# Patient Record
Sex: Female | Born: 1940 | Race: White | Hispanic: No | Marital: Married | Smoking: Never smoker
Health system: Southern US, Community
[De-identification: ages and names within clinical notes are randomized; demographics above are authoritative.]

## PROBLEM LIST (undated history)

## (undated) DIAGNOSIS — C50919 Malignant neoplasm of unspecified site of unspecified female breast: Secondary | ICD-10-CM

## (undated) DIAGNOSIS — F419 Anxiety disorder, unspecified: Secondary | ICD-10-CM

## (undated) DIAGNOSIS — D649 Anemia, unspecified: Secondary | ICD-10-CM

## (undated) DIAGNOSIS — F329 Major depressive disorder, single episode, unspecified: Secondary | ICD-10-CM

## (undated) DIAGNOSIS — C4492 Squamous cell carcinoma of skin, unspecified: Secondary | ICD-10-CM

## (undated) DIAGNOSIS — F32A Depression, unspecified: Secondary | ICD-10-CM

## (undated) DIAGNOSIS — K219 Gastro-esophageal reflux disease without esophagitis: Secondary | ICD-10-CM

## (undated) DIAGNOSIS — M810 Age-related osteoporosis without current pathological fracture: Secondary | ICD-10-CM

## (undated) DIAGNOSIS — I10 Essential (primary) hypertension: Secondary | ICD-10-CM

## (undated) DIAGNOSIS — E119 Type 2 diabetes mellitus without complications: Secondary | ICD-10-CM

## (undated) DIAGNOSIS — E785 Hyperlipidemia, unspecified: Secondary | ICD-10-CM

## (undated) DIAGNOSIS — I2699 Other pulmonary embolism without acute cor pulmonale: Secondary | ICD-10-CM

## (undated) DIAGNOSIS — M797 Fibromyalgia: Secondary | ICD-10-CM

## (undated) DIAGNOSIS — E039 Hypothyroidism, unspecified: Secondary | ICD-10-CM

## (undated) DIAGNOSIS — M1990 Unspecified osteoarthritis, unspecified site: Secondary | ICD-10-CM

## (undated) HISTORY — PX: MASTECTOMY: SHX3

## (undated) HISTORY — PX: CATARACT EXTRACTION: SUR2

## (undated) HISTORY — DX: Other pulmonary embolism without acute cor pulmonale: I26.99

## (undated) HISTORY — PX: APPENDECTOMY: SHX54

## (undated) HISTORY — DX: Anxiety disorder, unspecified: F41.9

## (undated) HISTORY — DX: Unspecified osteoarthritis, unspecified site: M19.90

## (undated) HISTORY — DX: Essential (primary) hypertension: I10

## (undated) HISTORY — DX: Hyperlipidemia, unspecified: E78.5

## (undated) HISTORY — PX: TOTAL ABDOMINAL HYSTERECTOMY W/ BILATERAL SALPINGOOPHORECTOMY: SHX83

## (undated) HISTORY — PX: TUBAL LIGATION: SHX77

## (undated) HISTORY — DX: Type 2 diabetes mellitus without complications: E11.9

## (undated) HISTORY — PX: BREAST RECONSTRUCTION: SHX9

## (undated) HISTORY — PX: SPINAL FUSION: SHX223

## (undated) HISTORY — DX: Depression, unspecified: F32.A

## (undated) HISTORY — DX: Hypothyroidism, unspecified: E03.9

## (undated) HISTORY — DX: Anemia, unspecified: D64.9

## (undated) HISTORY — DX: Age-related osteoporosis without current pathological fracture: M81.0

## (undated) HISTORY — DX: Malignant neoplasm of unspecified site of unspecified female breast: C50.919

## (undated) HISTORY — PX: ROTATOR CUFF REPAIR: SHX139

## (undated) HISTORY — DX: Fibromyalgia: M79.7

## (undated) HISTORY — DX: Squamous cell carcinoma of skin, unspecified: C44.92

## (undated) HISTORY — DX: Gastro-esophageal reflux disease without esophagitis: K21.9

## (undated) HISTORY — DX: Major depressive disorder, single episode, unspecified: F32.9

---

## 1991-03-21 DIAGNOSIS — C50919 Malignant neoplasm of unspecified site of unspecified female breast: Secondary | ICD-10-CM

## 1991-03-21 HISTORY — DX: Malignant neoplasm of unspecified site of unspecified female breast: C50.919

## 1991-03-21 HISTORY — PX: BREAST BIOPSY: SHX20

## 1997-12-22 ENCOUNTER — Encounter: Payer: Self-pay | Admitting: Family Medicine

## 1997-12-22 ENCOUNTER — Ambulatory Visit (HOSPITAL_COMMUNITY): Admission: RE | Admit: 1997-12-22 | Discharge: 1997-12-22 | Payer: Self-pay | Admitting: Family Medicine

## 1998-04-30 ENCOUNTER — Encounter: Payer: Self-pay | Admitting: Family Medicine

## 1998-04-30 ENCOUNTER — Ambulatory Visit (HOSPITAL_COMMUNITY): Admission: RE | Admit: 1998-04-30 | Discharge: 1998-04-30 | Payer: Self-pay | Admitting: Family Medicine

## 1999-01-03 ENCOUNTER — Encounter: Payer: Self-pay | Admitting: Oncology

## 1999-01-03 ENCOUNTER — Ambulatory Visit (HOSPITAL_COMMUNITY): Admission: RE | Admit: 1999-01-03 | Discharge: 1999-01-03 | Payer: Self-pay | Admitting: Oncology

## 1999-01-17 ENCOUNTER — Encounter: Admission: RE | Admit: 1999-01-17 | Discharge: 1999-02-08 | Payer: Self-pay | Admitting: Podiatry

## 1999-05-13 ENCOUNTER — Encounter: Payer: Self-pay | Admitting: Family Medicine

## 1999-05-13 ENCOUNTER — Encounter: Admission: RE | Admit: 1999-05-13 | Discharge: 1999-05-13 | Payer: Self-pay | Admitting: Family Medicine

## 2000-02-03 ENCOUNTER — Ambulatory Visit (HOSPITAL_COMMUNITY): Admission: RE | Admit: 2000-02-03 | Discharge: 2000-02-03 | Payer: Self-pay | Admitting: Obstetrics & Gynecology

## 2000-02-03 ENCOUNTER — Encounter: Payer: Self-pay | Admitting: Gynecology

## 2000-06-28 ENCOUNTER — Other Ambulatory Visit: Admission: RE | Admit: 2000-06-28 | Discharge: 2000-06-28 | Payer: Self-pay | Admitting: Gynecology

## 2000-08-07 ENCOUNTER — Encounter: Admission: RE | Admit: 2000-08-07 | Discharge: 2000-08-07 | Payer: Self-pay

## 2001-01-16 ENCOUNTER — Encounter: Payer: Self-pay | Admitting: Gastroenterology

## 2001-01-16 ENCOUNTER — Ambulatory Visit (HOSPITAL_COMMUNITY): Admission: RE | Admit: 2001-01-16 | Discharge: 2001-01-16 | Payer: Self-pay | Admitting: Gastroenterology

## 2001-07-09 ENCOUNTER — Ambulatory Visit (HOSPITAL_COMMUNITY): Admission: RE | Admit: 2001-07-09 | Discharge: 2001-07-09 | Payer: Self-pay | Admitting: Gynecology

## 2001-07-09 ENCOUNTER — Encounter: Payer: Self-pay | Admitting: Gynecology

## 2002-11-12 ENCOUNTER — Ambulatory Visit (HOSPITAL_COMMUNITY): Admission: RE | Admit: 2002-11-12 | Discharge: 2002-11-12 | Payer: Self-pay | Admitting: Gynecology

## 2002-11-12 ENCOUNTER — Encounter: Payer: Self-pay | Admitting: Gynecology

## 2003-11-18 ENCOUNTER — Ambulatory Visit (HOSPITAL_COMMUNITY): Admission: RE | Admit: 2003-11-18 | Discharge: 2003-11-18 | Payer: Self-pay | Admitting: Gynecology

## 2003-12-03 ENCOUNTER — Other Ambulatory Visit: Admission: RE | Admit: 2003-12-03 | Discharge: 2003-12-03 | Payer: Self-pay | Admitting: Gynecology

## 2004-06-13 ENCOUNTER — Encounter: Admission: RE | Admit: 2004-06-13 | Discharge: 2004-06-13 | Payer: Self-pay

## 2004-11-30 ENCOUNTER — Ambulatory Visit (HOSPITAL_COMMUNITY): Admission: RE | Admit: 2004-11-30 | Discharge: 2004-11-30 | Payer: Self-pay | Admitting: Gynecology

## 2004-12-13 ENCOUNTER — Encounter: Admission: RE | Admit: 2004-12-13 | Discharge: 2004-12-13 | Payer: Self-pay | Admitting: Family Medicine

## 2005-12-07 ENCOUNTER — Ambulatory Visit (HOSPITAL_COMMUNITY): Admission: RE | Admit: 2005-12-07 | Discharge: 2005-12-07 | Payer: Self-pay | Admitting: Family Medicine

## 2006-01-16 ENCOUNTER — Emergency Department (HOSPITAL_COMMUNITY): Admission: EM | Admit: 2006-01-16 | Discharge: 2006-01-16 | Payer: Self-pay | Admitting: Family Medicine

## 2006-07-25 ENCOUNTER — Encounter: Payer: Self-pay | Admitting: Internal Medicine

## 2006-07-27 ENCOUNTER — Encounter: Payer: Self-pay | Admitting: Internal Medicine

## 2006-11-28 ENCOUNTER — Emergency Department (HOSPITAL_COMMUNITY): Admission: EM | Admit: 2006-11-28 | Discharge: 2006-11-28 | Payer: Self-pay | Admitting: Emergency Medicine

## 2007-01-30 ENCOUNTER — Encounter: Admission: RE | Admit: 2007-01-30 | Discharge: 2007-01-30 | Payer: Self-pay | Admitting: Gynecology

## 2007-02-25 ENCOUNTER — Ambulatory Visit: Payer: Self-pay | Admitting: Internal Medicine

## 2007-03-08 ENCOUNTER — Encounter: Payer: Self-pay | Admitting: Internal Medicine

## 2007-03-08 ENCOUNTER — Ambulatory Visit: Payer: Self-pay | Admitting: Internal Medicine

## 2007-03-08 DIAGNOSIS — K222 Esophageal obstruction: Secondary | ICD-10-CM | POA: Insufficient documentation

## 2007-03-08 DIAGNOSIS — K2970 Gastritis, unspecified, without bleeding: Secondary | ICD-10-CM | POA: Insufficient documentation

## 2007-03-08 DIAGNOSIS — K2990 Gastroduodenitis, unspecified, without bleeding: Secondary | ICD-10-CM

## 2007-03-08 HISTORY — DX: Esophageal obstruction: K22.2

## 2007-03-08 LAB — HM COLONOSCOPY: HM Colonoscopy: NORMAL

## 2007-03-20 DIAGNOSIS — F411 Generalized anxiety disorder: Secondary | ICD-10-CM | POA: Insufficient documentation

## 2007-03-20 DIAGNOSIS — K449 Diaphragmatic hernia without obstruction or gangrene: Secondary | ICD-10-CM | POA: Insufficient documentation

## 2007-03-20 DIAGNOSIS — K219 Gastro-esophageal reflux disease without esophagitis: Secondary | ICD-10-CM | POA: Insufficient documentation

## 2007-03-20 DIAGNOSIS — C50919 Malignant neoplasm of unspecified site of unspecified female breast: Secondary | ICD-10-CM | POA: Insufficient documentation

## 2007-03-20 DIAGNOSIS — D68318 Other hemorrhagic disorder due to intrinsic circulating anticoagulants, antibodies, or inhibitors: Secondary | ICD-10-CM | POA: Insufficient documentation

## 2007-03-20 DIAGNOSIS — F3289 Other specified depressive episodes: Secondary | ICD-10-CM | POA: Insufficient documentation

## 2007-03-20 DIAGNOSIS — I10 Essential (primary) hypertension: Secondary | ICD-10-CM

## 2007-03-20 DIAGNOSIS — E039 Hypothyroidism, unspecified: Secondary | ICD-10-CM | POA: Insufficient documentation

## 2007-03-20 DIAGNOSIS — I2699 Other pulmonary embolism without acute cor pulmonale: Secondary | ICD-10-CM | POA: Insufficient documentation

## 2007-03-20 DIAGNOSIS — F329 Major depressive disorder, single episode, unspecified: Secondary | ICD-10-CM

## 2007-03-20 DIAGNOSIS — E785 Hyperlipidemia, unspecified: Secondary | ICD-10-CM | POA: Insufficient documentation

## 2007-03-20 HISTORY — DX: Essential (primary) hypertension: I10

## 2007-03-23 ENCOUNTER — Inpatient Hospital Stay (HOSPITAL_COMMUNITY): Admission: EM | Admit: 2007-03-23 | Discharge: 2007-03-28 | Payer: Self-pay | Admitting: Emergency Medicine

## 2007-03-25 ENCOUNTER — Encounter (INDEPENDENT_AMBULATORY_CARE_PROVIDER_SITE_OTHER): Payer: Self-pay | Admitting: Internal Medicine

## 2007-03-25 ENCOUNTER — Ambulatory Visit: Payer: Self-pay | Admitting: Vascular Surgery

## 2007-04-13 ENCOUNTER — Emergency Department (HOSPITAL_COMMUNITY): Admission: EM | Admit: 2007-04-13 | Discharge: 2007-04-14 | Payer: Self-pay | Admitting: Emergency Medicine

## 2007-05-16 ENCOUNTER — Ambulatory Visit: Payer: Self-pay | Admitting: Cardiology

## 2007-05-17 ENCOUNTER — Emergency Department (HOSPITAL_COMMUNITY): Admission: EM | Admit: 2007-05-17 | Discharge: 2007-05-17 | Payer: Self-pay | Admitting: Emergency Medicine

## 2007-05-23 ENCOUNTER — Ambulatory Visit: Payer: Self-pay

## 2007-06-26 ENCOUNTER — Ambulatory Visit (HOSPITAL_COMMUNITY): Admission: RE | Admit: 2007-06-26 | Discharge: 2007-06-26 | Payer: Self-pay | Admitting: Family Medicine

## 2007-08-09 ENCOUNTER — Ambulatory Visit: Payer: Self-pay | Admitting: Cardiology

## 2007-09-25 ENCOUNTER — Ambulatory Visit (HOSPITAL_COMMUNITY): Admission: RE | Admit: 2007-09-25 | Discharge: 2007-09-25 | Payer: Self-pay | Admitting: Orthopedic Surgery

## 2008-02-11 ENCOUNTER — Encounter: Admission: RE | Admit: 2008-02-11 | Discharge: 2008-02-11 | Payer: Self-pay | Admitting: Gynecology

## 2008-07-16 ENCOUNTER — Encounter: Payer: Self-pay | Admitting: Cardiology

## 2008-08-27 ENCOUNTER — Ambulatory Visit: Payer: Self-pay | Admitting: Internal Medicine

## 2008-08-27 DIAGNOSIS — R911 Solitary pulmonary nodule: Secondary | ICD-10-CM | POA: Insufficient documentation

## 2008-08-27 DIAGNOSIS — J3089 Other allergic rhinitis: Secondary | ICD-10-CM

## 2008-08-27 DIAGNOSIS — J984 Other disorders of lung: Secondary | ICD-10-CM | POA: Insufficient documentation

## 2008-08-27 DIAGNOSIS — J302 Other seasonal allergic rhinitis: Secondary | ICD-10-CM | POA: Insufficient documentation

## 2008-08-27 DIAGNOSIS — M797 Fibromyalgia: Secondary | ICD-10-CM | POA: Insufficient documentation

## 2008-08-27 DIAGNOSIS — D649 Anemia, unspecified: Secondary | ICD-10-CM | POA: Insufficient documentation

## 2008-08-27 DIAGNOSIS — E119 Type 2 diabetes mellitus without complications: Secondary | ICD-10-CM

## 2008-08-27 HISTORY — DX: Type 2 diabetes mellitus without complications: E11.9

## 2008-08-27 LAB — CONVERTED CEMR LAB
Albumin: 4.2 g/dL (ref 3.5–5.2)
Alkaline Phosphatase: 36 units/L — ABNORMAL LOW (ref 39–117)
Bilirubin, Direct: 0.1 mg/dL (ref 0.0–0.3)
Chloride: 111 meq/L (ref 96–112)
Cholesterol: 173 mg/dL (ref 0–200)
Creatinine, Ser: 1 mg/dL (ref 0.4–1.2)
Direct LDL: 102.3 mg/dL
Eosinophils Absolute: 0.1 10*3/uL (ref 0.0–0.7)
Eosinophils Relative: 1.3 % (ref 0.0–5.0)
Folate: >20 ng/mL
GFR calc non Af Amer: 58.89 mL/min (ref >60)
Lymphs Abs: 1.8 10*3/uL (ref 0.7–4.0)
MCV: 92.6 fL (ref 78.0–100.0)
Monocytes Relative: 8.5 % (ref 3.0–12.0)
Neutrophils Relative %: 51 % (ref 43.0–77.0)
Platelets: 177 10*3/uL (ref 150.0–400.0)
RBC: 3.9 M/uL (ref 3.87–5.11)
RDW: 11.9 % (ref 11.5–14.6)
Sodium: 146 meq/L — ABNORMAL HIGH (ref 135–145)
TSH: 0.73 microintl units/mL (ref 0.35–5.50)
Total Bilirubin: 0.8 mg/dL (ref 0.3–1.2)
Triglycerides: 306 mg/dL — ABNORMAL HIGH (ref 0.0–149.0)
Vitamin B-12: >1500 pg/mL — ABNORMAL HIGH (ref 211–911)
WBC: 4.7 10*3/uL (ref 4.5–10.5)

## 2008-09-01 ENCOUNTER — Ambulatory Visit: Payer: Self-pay | Admitting: Internal Medicine

## 2008-10-02 ENCOUNTER — Ambulatory Visit: Payer: Self-pay | Admitting: Internal Medicine

## 2008-10-07 ENCOUNTER — Ambulatory Visit: Payer: Self-pay | Admitting: Cardiology

## 2008-10-07 LAB — CONVERTED CEMR LAB: Prothrombin Time: 19.2 s

## 2008-11-05 ENCOUNTER — Ambulatory Visit: Payer: Self-pay | Admitting: Cardiology

## 2008-11-09 ENCOUNTER — Encounter: Payer: Self-pay | Admitting: Internal Medicine

## 2008-11-09 ENCOUNTER — Ambulatory Visit: Payer: Self-pay | Admitting: Internal Medicine

## 2008-11-09 DIAGNOSIS — R0609 Other forms of dyspnea: Secondary | ICD-10-CM

## 2008-11-09 HISTORY — DX: Other forms of dyspnea: R06.09

## 2008-11-10 ENCOUNTER — Telehealth: Payer: Self-pay | Admitting: Internal Medicine

## 2008-12-02 ENCOUNTER — Emergency Department (HOSPITAL_COMMUNITY): Admission: EM | Admit: 2008-12-02 | Discharge: 2008-12-02 | Payer: Self-pay | Admitting: Family Medicine

## 2008-12-04 ENCOUNTER — Ambulatory Visit: Payer: Self-pay | Admitting: Internal Medicine

## 2008-12-10 ENCOUNTER — Ambulatory Visit: Payer: Self-pay | Admitting: Internal Medicine

## 2008-12-17 ENCOUNTER — Telehealth (INDEPENDENT_AMBULATORY_CARE_PROVIDER_SITE_OTHER): Payer: Self-pay

## 2008-12-24 ENCOUNTER — Telehealth: Payer: Self-pay | Admitting: Internal Medicine

## 2008-12-31 ENCOUNTER — Encounter: Admission: RE | Admit: 2008-12-31 | Discharge: 2008-12-31 | Payer: Self-pay | Admitting: Endocrinology

## 2009-01-13 ENCOUNTER — Ambulatory Visit: Payer: Self-pay | Admitting: Cardiology

## 2009-01-14 ENCOUNTER — Ambulatory Visit: Payer: Self-pay | Admitting: Internal Medicine

## 2009-01-14 DIAGNOSIS — B9789 Other viral agents as the cause of diseases classified elsewhere: Secondary | ICD-10-CM | POA: Insufficient documentation

## 2009-01-14 LAB — HM DIABETES FOOT EXAM

## 2009-02-02 ENCOUNTER — Ambulatory Visit: Payer: Self-pay | Admitting: Cardiology

## 2009-02-24 ENCOUNTER — Ambulatory Visit: Payer: Self-pay | Admitting: Cardiology

## 2009-03-24 ENCOUNTER — Ambulatory Visit: Payer: Self-pay | Admitting: Cardiovascular Disease

## 2009-03-24 LAB — CONVERTED CEMR LAB: POC INR: 2.1

## 2009-04-19 ENCOUNTER — Encounter: Payer: Self-pay | Admitting: Internal Medicine

## 2009-04-19 LAB — CONVERTED CEMR LAB
AST: 25 units/L
Alkaline Phosphatase: 37 units/L
BUN: 23 mg/dL
CO2: 32 meq/L
Calcium: 9.2 mg/dL
Chloride: 103 meq/L
Cholesterol: 209 mg/dL
Creatinine, Ser: 1.1 mg/dL
Glucose, Bld: 109 mg/dL
HDL: 33 mg/dL
LDL Cholesterol: 125 mg/dL
Potassium: 4.4 meq/L
Sodium: 140 meq/L
TSH: 0.77 microintl units/mL
Triglyceride fasting, serum: 256 mg/dL

## 2009-04-21 ENCOUNTER — Ambulatory Visit: Payer: Self-pay | Admitting: Internal Medicine

## 2009-04-27 ENCOUNTER — Telehealth: Payer: Self-pay | Admitting: Internal Medicine

## 2009-05-21 ENCOUNTER — Ambulatory Visit: Payer: Self-pay | Admitting: Cardiology

## 2009-05-21 LAB — CONVERTED CEMR LAB: POC INR: 2.9

## 2009-05-24 ENCOUNTER — Ambulatory Visit: Payer: Self-pay | Admitting: Internal Medicine

## 2009-06-21 ENCOUNTER — Ambulatory Visit: Payer: Self-pay | Admitting: Cardiovascular Disease

## 2009-06-21 LAB — CONVERTED CEMR LAB: POC INR: 3

## 2009-06-29 ENCOUNTER — Encounter: Payer: Self-pay | Admitting: Internal Medicine

## 2009-07-12 ENCOUNTER — Ambulatory Visit: Payer: Self-pay | Admitting: Cardiovascular Disease

## 2009-07-12 LAB — CONVERTED CEMR LAB: POC INR: 2.1

## 2009-07-20 ENCOUNTER — Ambulatory Visit: Payer: Self-pay | Admitting: Cardiology

## 2009-07-22 ENCOUNTER — Telehealth (INDEPENDENT_AMBULATORY_CARE_PROVIDER_SITE_OTHER): Payer: Self-pay | Admitting: *Deleted

## 2009-07-26 ENCOUNTER — Ambulatory Visit: Payer: Self-pay | Admitting: Internal Medicine

## 2009-07-26 ENCOUNTER — Encounter (HOSPITAL_COMMUNITY): Admission: RE | Admit: 2009-07-26 | Discharge: 2009-09-15 | Payer: Self-pay | Admitting: Cardiology

## 2009-07-26 ENCOUNTER — Ambulatory Visit: Payer: Self-pay

## 2009-08-02 ENCOUNTER — Ambulatory Visit: Payer: Self-pay | Admitting: Cardiovascular Disease

## 2009-08-02 LAB — CONVERTED CEMR LAB: INR: 2.3

## 2009-08-30 ENCOUNTER — Ambulatory Visit: Payer: Self-pay | Admitting: Cardiology

## 2009-08-30 LAB — CONVERTED CEMR LAB: POC INR: 2

## 2009-09-21 ENCOUNTER — Telehealth: Payer: Self-pay | Admitting: Internal Medicine

## 2009-09-27 ENCOUNTER — Ambulatory Visit: Payer: Self-pay | Admitting: Internal Medicine

## 2009-09-27 LAB — CONVERTED CEMR LAB: POC INR: 2.2

## 2009-10-25 ENCOUNTER — Ambulatory Visit: Payer: Self-pay | Admitting: Internal Medicine

## 2009-10-25 ENCOUNTER — Ambulatory Visit: Payer: Self-pay | Admitting: Cardiovascular Disease

## 2009-10-25 DIAGNOSIS — R1011 Right upper quadrant pain: Secondary | ICD-10-CM | POA: Insufficient documentation

## 2009-10-25 LAB — CONVERTED CEMR LAB: POC INR: 1.8

## 2009-10-26 LAB — CONVERTED CEMR LAB
ALT: 20 units/L (ref 0–35)
AST: 27 units/L (ref 0–37)
Alkaline Phosphatase: 34 units/L — ABNORMAL LOW (ref 39–117)
BUN: 30 mg/dL — ABNORMAL HIGH (ref 6–23)
Basophils Absolute: 0 10*3/uL (ref 0.0–0.1)
Basophils Relative: 0.3 % (ref 0.0–3.0)
Bilirubin, Direct: 0.1 mg/dL (ref 0.0–0.3)
Creatinine, Ser: 1 mg/dL (ref 0.4–1.2)
Eosinophils Absolute: 0.1 10*3/uL (ref 0.0–0.7)
Eosinophils Relative: 1.2 % (ref 0.0–5.0)
Lymphs Abs: 2.1 10*3/uL (ref 0.7–4.0)
Neutro Abs: 3.4 10*3/uL (ref 1.4–7.7)
Neutrophils Relative %: 55.5 % (ref 43.0–77.0)
Sodium: 142 meq/L (ref 135–145)

## 2009-11-01 ENCOUNTER — Encounter: Admission: RE | Admit: 2009-11-01 | Discharge: 2009-11-01 | Payer: Self-pay | Admitting: Internal Medicine

## 2009-11-17 ENCOUNTER — Ambulatory Visit: Payer: Self-pay | Admitting: Cardiology

## 2009-11-17 ENCOUNTER — Encounter: Admission: RE | Admit: 2009-11-17 | Discharge: 2009-11-17 | Payer: Self-pay | Admitting: Gynecology

## 2009-11-17 LAB — HM MAMMOGRAPHY: HM Mammogram: NEGATIVE

## 2009-12-09 ENCOUNTER — Ambulatory Visit: Payer: Self-pay | Admitting: Internal Medicine

## 2009-12-15 ENCOUNTER — Ambulatory Visit: Payer: Self-pay | Admitting: Internal Medicine

## 2009-12-15 LAB — CONVERTED CEMR LAB: POC INR: 2.2

## 2010-01-12 ENCOUNTER — Ambulatory Visit: Payer: Self-pay | Admitting: Internal Medicine

## 2010-01-12 LAB — CONVERTED CEMR LAB: POC INR: 1.6

## 2010-01-20 ENCOUNTER — Ambulatory Visit: Admission: RE | Admit: 2010-01-20 | Discharge: 2010-01-20 | Payer: Self-pay | Admitting: Internal Medicine

## 2010-01-20 ENCOUNTER — Ambulatory Visit: Payer: Self-pay | Admitting: Vascular Surgery

## 2010-01-20 ENCOUNTER — Ambulatory Visit: Payer: Self-pay | Admitting: Internal Medicine

## 2010-01-20 ENCOUNTER — Encounter: Payer: Self-pay | Admitting: Internal Medicine

## 2010-01-20 DIAGNOSIS — R252 Cramp and spasm: Secondary | ICD-10-CM | POA: Insufficient documentation

## 2010-01-20 LAB — CONVERTED CEMR LAB: Potassium: 4.8 meq/L (ref 3.5–5.1)

## 2010-02-02 ENCOUNTER — Ambulatory Visit: Payer: Self-pay | Admitting: Cardiology

## 2010-02-02 LAB — CONVERTED CEMR LAB
INR: 1.8
POC INR: 1.8

## 2010-02-07 ENCOUNTER — Telehealth (INDEPENDENT_AMBULATORY_CARE_PROVIDER_SITE_OTHER): Payer: Self-pay | Admitting: *Deleted

## 2010-02-15 ENCOUNTER — Ambulatory Visit: Payer: Self-pay | Admitting: Cardiology

## 2010-02-18 ENCOUNTER — Encounter: Payer: Self-pay | Admitting: Internal Medicine

## 2010-03-08 ENCOUNTER — Ambulatory Visit: Payer: Self-pay | Admitting: Cardiology

## 2010-03-08 LAB — CONVERTED CEMR LAB
INR: 2.7
POC INR: 2.7

## 2010-04-05 ENCOUNTER — Ambulatory Visit: Admission: RE | Admit: 2010-04-05 | Discharge: 2010-04-05 | Payer: Self-pay | Source: Home / Self Care

## 2010-04-21 NOTE — Medication Information (Signed)
Summary: rov/ln  Anticoagulant Therapy  Managed by: Porfirio Oar, PharmD Referring MD: Jenell Milliner, MD PCP: Rowe Clack MD Supervising MD: Harrington Challenger MD, Nevin Bloodgood Indication 1: Pulmonary Embolism Lab Used: [facility] [place] Site: [place] INR POC 1.8 INR RANGE 2.0-3.0  Dietary changes: yes       Details: Had a salad every night for the past 4 nights.   Health status changes: no    Bleeding/hemorrhagic complications: no      Recent/future dental: no  Any missed doses?: no       Is patient compliant with meds? yes       Allergies: 1)  ! Morphine 2)  ! Demerol 3)  ! * Anaprex  Anticoagulation Management History:      The patient is taking warfarin and comes in today for a routine follow up visit.  Positive risk factors for bleeding include an age of 68 years or older and presence of serious comorbidities.  The bleeding index is 'intermediate risk'.  Positive CHADS2 values include History of HTN and History of Diabetes.  Negative CHADS2 values include Age > 41 years old.  Her last INR was 2.3.  Anticoagulation responsible provider: Harrington Challenger MD, Nevin Bloodgood.  INR POC: 1.8.  Cuvette Lot#: [id number].  Exp: 11/2010.    Anticoagulation Management Assessment/Plan:      The patient's current anticoagulation dose is Warfarin sodium 5 mg tabs: use asd.  The target INR is 2.0-3.0.  The next INR is due 11/22/2009.  Anticoagulation instructions were given to patient.  Results were reviewed/authorized by Porfirio Oar, PharmD.  She was notified by Vassie Loll, PharmD Candidate.         Prior Anticoagulation Instructions: INR 2.2  Continue 1 tab on Monday and Friday and 1/2 tab on Sunday, Tuesday, Wednesday, Thursday, and Saturday.  Re-check INR in 4 weeks.  Current Anticoagulation Instructions: INR- 1.8  Take 1.5 tablets (7.5mg ) tonight.  Then resume schedule of taking 1/2 tablet (2.5mg ) every day except take 1 tablet (5mg ) on Mon and Fri.

## 2010-04-21 NOTE — Medication Information (Signed)
Summary: rov/ez  Anticoagulant Therapy  Managed by: Alinda Deem, PharmD, BCPS, CPP Referring MD: Annamaria Boots MD, Berton Mount PCP: Dr. Loma Sender MD: Angelena Form MD, Harrell Gave Indication 1: Pulmonary Embolism Lab Used: [facility] [place] Site: [place] INR POC 2.6 INR RANGE 2.0-3.0  Dietary changes: no    Health status changes: no    Bleeding/hemorrhagic complications: no    Recent/future hospitalizations: no    Any changes in medication regimen? no    Recent/future dental: no  Any missed doses?: no       Is patient compliant with meds? yes       Allergies (verified): 1)  ! Morphine 2)  ! Demerol 3)  ! * Anaprex  Anticoagulation Management History:      The patient is taking warfarin and comes in today for a routine follow up visit.  Positive risk factors for bleeding include an age of 70 years or older and presence of serious comorbidities.  The bleeding index is 'intermediate risk'.  Positive CHADS2 values include History of HTN and History of Diabetes.  Negative CHADS2 values include Age > 68 years old.  Anticoagulation responsible provider: Angelena Form MD, Harrell Gave.  INR POC: 2.6.  Exp: 04/2010.    Anticoagulation Management Assessment/Plan:      The patient's current anticoagulation dose is Warfarin sodium 5 mg tabs: use asd.  The target INR is 2.0-3.0.  The next INR is due 05/19/2009.  Anticoagulation instructions were given to patient.  Results were reviewed/authorized by Alinda Deem, PharmD, BCPS, CPP.  She was notified by Alinda Deem PharmD, BCPS, CPP.         Prior Anticoagulation Instructions: INR 2.1 Continue the same dosage 2.5mg  daily except 5mg  on Mondays and Fridays Recheck in 4 weeks  Current Anticoagulation Instructions: INR 2.6  Continue 0.5 tab daily except 1 tab Monday and Friday

## 2010-04-21 NOTE — Progress Notes (Signed)
Summary: PA Omeprazole  Phone Note Call from Patient Call back at [facility] Phone [phone]   Caller: Patient Complaint: Breathing Problems Summary of Call: Pt called stating PA is needed on her Omeprazole 20mg  for GERD [phone] Initial call taken by: Crissie Sickles, CMA,  February 07, 2010 2:52 PM  Follow-up for Phone Call        Paperwork will be faxed per [employer]. Case number [id number] Follow-up by: Crissie Sickles, CMA,  February 15, 2010 2:54 PM  Additional Follow-up for Phone Call Additional follow up Details #1::        formed filled out, signed and faxed. Crissie Sickles, CMA  February 18, 2010 11:11 AM     Additional Follow-up for Phone Call Additional follow up Details #2::    PA-Omeprazole approved 01/28/10-02/18/11, pt aware. Follow-up by: Ophelia Charter,  February 21, 2010 10:36 AM

## 2010-04-21 NOTE — Assessment & Plan Note (Signed)
Summary: ROV 1 YR ///KP   Primary Marcial Pless/Referring Raeqwon Lux:  Rowe Clack MD   History of Present Illness:  History of Present Illness: 10/02/08- Self referred for pulmonary- hx benign lung nodules on CT last month, but main concern is hx of recurrent pulmonary embolism,  Records from [facility]  2008.. Presented in 2008 with dyspnea PE confirmed on CT angio. Rx'd coumadin 6 months, stopped  to have an endoscopy. In Jan, 2009 hosp [facility] for recurrent PE. Risk factors- breast ca, [place], travel. Now no acute issue but she wanted to establish with pulmonary for access as needed. No bleeding on chronic coumadin. Was managed through [facility] but will need to establish for coumadin clinic. Having persistent cramps medial left thigh, dyspnea with exertion cleaning house.  12/10/08- Allergic rhinitis, lung nodules, Hx PE/ coumadin, hx breast cancer Returns for results of PFT. Denies new or acute problems, chest pain, palpitation. Dyspnea still with exertion such as cleaning home. Aware of raspy voice fatigue bu not easy choking. PFT- reviewed by me- mod restriction, TLC 58%, DLCO 78% 6MWT- 99%, 97, 99 480 m- good oxygenation. For cxr next visit  December 09, 2009- Hx PE x 2/ coumadin, lung nodules, Hx breast CA, Allergic rhinitis Off allergy shots for several years, she is doing pretty well without nasal congestion or sneezxe nw in Ragweed season.  Denies cough, chst pain or sensitivity to weather. No blood as she continues long term warfarin for hx of PE. We again reviewed PFT and 6 MWET from lov last year.  CT chest 08/2008 was stable back to 2009.   Preventive Screening-Counseling & Management  Alcohol-Tobacco     Smoking Status: never  Allergies: 1)  ! Morphine 2)  ! Demerol 3)  ! 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) HYPOTHYROIDISM (ICD-244.9) ADENOCARCINOMA, BREAST (ICD-174.9) ANXIETY (ICD-300.00) DEPRESSION (ICD-311) GLUCOSE INTOLERANCE (ICD-271.3) ALLERGIC RHINITIS (ICD-477.9)   MD roster cards -wall and [facility] cc GI -perry rheum -anderson pulm -young endo -balan  Past Surgical History: Last updated: 10/02/2008 Appendectomy T A H and B S O Tubal ligation left breast surgery/biopsy 1993, reconstruction spinal fusion x 2 cataract surgery Hysterectomy Cataract extraction Rotator cuff  Family History: Last updated: 10/02/2008 parent with arthritis, depression grandparent with arthritis, stroke, HTN other with arthriits, breast cancer, elevated cholesterol GM -strokes when elderly  Social History: Last updated: 05/24/2009 work - school bus driver, Pharmacist, hospital asst, hair dresser Patient never smoked married Alcohol use-no Drug use-no Regular exercise-yes  Risk Factors: Alcohol Use: 0 (05/24/2009) Exercise: yes (05/24/2009)  Risk Factors: Smoking Status: never (12/09/2009)  Review of Systems      See HPI  The patient denies shortness of breath with activity, shortness of breath at rest, productive cough, non-productive cough, coughing up blood, chest pain, irregular heartbeats, acid heartburn, indigestion, loss of appetite, weight change, abdominal pain, difficulty swallowing, sore throat, tooth/dental problems, headaches, nasal congestion/difficulty breathing through nose, and sneezing.    Physical Exam  Additional Exam:  General: A/Ox3; pleasant and cooperative, NAD, talkative SKIN: no rash, lesions NODES: no lymphadenopathy HEENT: [place]/AT, EOM- WNL, Conjuctivae- clear, PERRLA, TM-WNL, Nose- clear, Throat- clear and wnl, minor hoarseness, no  stridor. NECK: Supple w/ fair ROM, JVD- none, normal carotid impulses w/o bruits Thyroid-  CHEST: Clear to P&A, left  mastect/ reconstruction HEART: RRR, no m/g/r heard, P2 not increased ABDOMEN: Soft and nl;  [id number], nl pulses, no edema , Neg Homan's NEURO: Grossly intact to observation      Impression & Recommendations:  Problem # 1:  PULMONARY EMBOLISM (ICD-415.19) No recurrence and no symptoms of DVT. Stable. She will continue coumadin. Her updated medication list for this problem includes:    Warfarin Sodium 5 Mg Tabs (Warfarin sodium) ..... Use asd    Aspir-low 81 Mg Tbec (Aspirin) .Marland Kitchen... Take one tablet daily  Problem # 2:  PULMONARY NODULE (ICD-518.89)  We will get a CXR and follow CXR for long term if we can. We have discussed trade off of radiation, cost and sensitivity of CT vs CXr.  Problem # 3:  ALLERGIC RHINITIS (ICD-477.9)  She denies seasonal problems, but can't visit friends with cats without coughing. I suggested she try taking an antihistamine ahead of the visit.  Other Orders: Est. Patient Level IV [id number]) T-2 View CXR ([id number])  Patient Instructions: 1)  Please schedule a follow-up appointment in 1 year. 2)  A chest x-ray has been recommended.  Your imaging study may require preauthorization.  3)  Try taking a nonsedating otc antihistamine like loratadine 4)  or allegra 60 mg/ 12 hour, at least a couple of hours before you plan to visit friends with cats and see if that helps. 5)  don't forget your flu shot

## 2010-04-21 NOTE — Progress Notes (Signed)
Summary: Nuclear Pre-Procedure  Phone Note Outgoing Call Call back at [facility] Phone [phone]   Call placed by: Eliezer Lofts, EMT-P,  Jul 22, 2009 1:44 PM Action Taken: Phone Call Completed Summary of Call: Left message with information on My[employer] Information Sheet (see scanned document for details).     Nuclear Med Background Indications for Stress Test: Evaluation for Ischemia   History: Echo, Myocardial Perfusion Study  History Comments: 1/09 Echo: EF= 65% 3/09 MPS: EF= 67%, (-) scar, (-) ischemia  Symptoms: DOE    Nuclear Pre-Procedure Cardiac Risk Factors: Family History - CAD, Hypertension, Lipids, NIDDM, Obesity Height (in): 58  Nuclear Med Study Referring MD:  Annamaria Boots MD, Berton Mount

## 2010-04-21 NOTE — Medication Information (Signed)
Summary: Prior autho for Omeprazole/[facility]  Priro autho for Omeprazole/[facility]   Imported By: Phillis Knack 03/16/2010 09:19:57  _____________________________________________________________________  External Attachment:    Type:   Image     Comment:   External Document

## 2010-04-21 NOTE — Medication Information (Signed)
Summary: rov/sp  Anticoagulant Therapy  Managed by: Mariel Craft Referring MD: Jenell Milliner, MD PCP: Rowe Clack MD Supervising MD: Ron Parker MD, Dellis Filbert Indication 1: Pulmonary Embolism Lab Used: [facility] [place] Site: [place] INR POC 1.8 INR RANGE 2.0-3.0  Dietary changes: no    Health status changes: no    Bleeding/hemorrhagic complications: no    Recent/future hospitalizations: no    Any changes in medication regimen? no    Recent/future dental: no  Any missed doses?: no       Is patient compliant with meds? yes       Allergies: 1)  ! Morphine 2)  ! Demerol 3)  ! * Anaprex  Anticoagulation Management History:      The patient is taking warfarin and comes in today for a routine follow up visit.  Positive risk factors for bleeding include an age of 34 years or older and presence of serious comorbidities.  The bleeding index is 'intermediate risk'.  Positive CHADS2 values include History of HTN and History of Diabetes.  Negative CHADS2 values include Age > 56 years old.  Her last INR was 2.3 and today's INR is 1.8.  Anticoagulation responsible provider: Ron Parker MD, Dellis Filbert.  INR POC: 1.8.  Exp: 02/2011.    Anticoagulation Management Assessment/Plan:      The patient's current anticoagulation dose is Warfarin sodium 5 mg tabs: use asd.  The target INR is 2.0-3.0.  The next INR is due 02/15/2010.  Anticoagulation instructions were given to patient.  Results were reviewed/authorized by Mariel Craft.  She was notified by Matilde Haymaker RN.         Prior Anticoagulation Instructions: INR 1.6  Take 1 tablet today and tomorrow then resume same dose of 1/2 tablet every day except 1 tablet on Monday and Friday.  Recheck INR in 2-3 weeks.   Current Anticoagulation Instructions: INR 1.8. Patient to increase dose to 1tab Mon, Wed and Friday. On Sun,Tue,Thur and Sat to 1/2 tab for those days.  Recheck in [place].

## 2010-04-21 NOTE — Progress Notes (Signed)
Summary: ?simva dose - [facility] query  Phone Note Outgoing Call   Summary of Call: recieved notice from pt's [facility] re: high dose simva dose and concerns for myopathy risk - given poorly controlled lipids but chronic mskel pain symptoms, will ask for cards opinion on lipid med/tx recs prior to making changes - thanks dr. Verl Blalock - Initial call taken by: Rowe Clack MD,  September 21, 2009 11:44 AM  Follow-up for Phone Call        I would ask her to change to Lipitor 40. If cant afford decrease Simva to 40mg  with followup bloodwork in 6 weeks. Follow-up by: Renella Cunas, MD, [facility],  September 22, 2009 9:19 AM     Appended Document: ?simva dose - [facility] query Medications Added SIMVASTATIN 80 MG TABS (SIMVASTATIN) Take 1/2 by mouth at bedtime       ok - lucy, please call and adivse pt of recommendations: stop simvastatin and start Lipiotr 40mg  for cholesterol treatment to minimize risk of any adverse drug problems (as per me and dr. wall) - may send in erx if pt agrees. if unable to afford lipitor, cut simva down to 40mg  daily (1/2 current dose) - needs OV in 6 weeks to followup on this - thanks Rowe Clack MD  September 22, 2009 12:40 PM   Clinical Lists Changes  Medications: Changed medication from SIMVASTATIN 80 MG TABS (SIMVASTATIN) Take one tablet by mouth daily at bedtime to SIMVASTATIN 80 MG TABS (SIMVASTATIN) Take 1/2 by mouth at bedtime     Called pt no ansew LMOM RTC concerning simvastatin....09/22/09@4 :32pm/LMB  Called pt again still no ansew LMOM RTC....09/23/09@2 :58pm/LMB  Pt return call back she states will not be at home # pls call her back on her husband cell @ [phone]. Called cell number pt was'nt available per husband will have wife to call back....09/24/09@11 :59am/LMB  Pt return call back gave her md reommendation concerning simvastatin. pt is requesting to just take 1/2 of the simvastatin. will update EMR.....09/24/09@1 :39pm/LMB

## 2010-04-21 NOTE — Medication Information (Signed)
Summary: rov/sp  Anticoagulant Therapy  Managed by: Tula Nakayama, RN, BSN Referring MD: Jenell Milliner, MD PCP: Rowe Clack MD Supervising MD: Lovena Le MD, Carleene Overlie Indication 1: Pulmonary Embolism Lab Used: [facility] [place] Site: [place] INR POC 2.2 INR RANGE 2.0-3.0  Dietary changes: no    Health status changes: no    Bleeding/hemorrhagic complications: no    Recent/future hospitalizations: no    Any changes in medication regimen? no    Recent/future dental: no  Any missed doses?: no       Is patient compliant with meds? yes       Allergies: 1)  ! Morphine 2)  ! Demerol 3)  ! * Anaprex  Anticoagulation Management History:      The patient is taking warfarin and comes in today for a routine follow up visit.  Positive risk factors for bleeding include an age of 67 years or older and presence of serious comorbidities.  The bleeding index is 'intermediate risk'.  Positive CHADS2 values include History of HTN and History of Diabetes.  Negative CHADS2 values include Age > 82 years old.  Her last INR was 2.3.  Anticoagulation responsible provider: Lovena Le MD, Carleene Overlie.  INR POC: 2.2.  Cuvette Lot#: [id number].  Exp: 01/2011.    Anticoagulation Management Assessment/Plan:      The patient's current anticoagulation dose is Warfarin sodium 5 mg tabs: use asd.  The target INR is 2.0-3.0.  The next INR is due 01/12/2010.  Anticoagulation instructions were given to patient.  Results were reviewed/authorized by Tula Nakayama, RN, BSN.  She was notified by Tula Nakayama, RN, BSN.         Prior Anticoagulation Instructions: INR 2.5  Continue 1/2 tablet daily except 1 tablet Mon and Fri.  Return to clinic in 4 weeks.  Current Anticoagulation Instructions: INR 2.2 Continue 2.5mg s daily except 5mg s on Mondays and Fridays. REcheck in 4 weeks.

## 2010-04-21 NOTE — Medication Information (Signed)
Summary: Andrea Simmons  Anticoagulant Therapy  Managed by: Tula Nakayama, RN Referring MD: Annamaria Boots MD, Berton Mount PCP: Dr. Loma Sender MD: Angelena Form MD, Harrell Gave Indication 1: Pulmonary Embolism Lab Used: [facility] [place] Site: [place] INR POC 2.1 INR RANGE 2.0-3.0  Dietary changes: no    Health status changes: no    Bleeding/hemorrhagic complications: no    Recent/future hospitalizations: no    Any changes in medication regimen? no    Recent/future dental: no  Any missed doses?: no       Is patient compliant with meds? yes       Allergies (verified): 1)  ! Morphine 2)  ! Demerol 3)  ! * Anaprex  Anticoagulation Management History:      The patient is taking warfarin and comes in today for a routine follow up visit.  Positive risk factors for bleeding include an age of 68 years or older and presence of serious comorbidities.  The bleeding index is 'intermediate risk'.  Positive CHADS2 values include History of HTN and History of Diabetes.  Negative CHADS2 values include Age > 55 years old.  Anticoagulation responsible provider: Angelena Form MD, Harrell Gave.  INR POC: 2.1.  Cuvette Lot#: [id number].  Exp: 04/2010.    Anticoagulation Management Assessment/Plan:      The patient's current anticoagulation dose is Warfarin sodium 5 mg tabs: use asd.  The target INR is 2.0-3.0.  The next INR is due 04/21/2009.  Anticoagulation instructions were given to patient.  Results were reviewed/authorized by Tula Nakayama, RN.  She was notified by Merlyn Albert.         Prior Anticoagulation Instructions: INR 2.2  Continue on same dosage 1/2 tablet daily except 1 tablet on Mondays and Fridays.   Recheck in 4 weeks.    Current Anticoagulation Instructions: INR 2.1 Continue the same dosage 2.5mg  daily except 5mg  on Mondays and Fridays Recheck in 4 weeks

## 2010-04-21 NOTE — Miscellaneous (Signed)
  Clinical Lists Changes  Orders: Added new Service order of EKG w/ Interpretation (93000) - Signed Added new Test order of TLB-CBC Platelet - w/Differential ([id number]) - Signed

## 2010-04-21 NOTE — Medication Information (Signed)
Summary: CVRR  ROV    JS  Anticoagulant Therapy  Managed by: Andrea March, RN, BSN Referring MD: Annamaria Boots MD, Berton Mount PCP: Andrea Clack MD Supervising MD: Andrea Form MD, Harrell Gave Indication 1: Pulmonary Embolism Lab Used: [facility] [place] Site: [place] INR POC 3.0 INR RANGE 2.0-3.0  Dietary changes: no    Health status changes: no    Bleeding/hemorrhagic complications: yes       Details: L eye slightly blood shot.    Recent/future hospitalizations: no    Any changes in medication regimen? no    Recent/future dental: no  Any missed doses?: no       Is patient compliant with meds? yes       Allergies: 1)  ! Morphine 2)  ! Demerol 3)  ! * Anaprex  Anticoagulation Management History:      The patient is taking warfarin and comes in today for a routine follow up visit.  Positive risk factors for bleeding include an age of 68 years or older and presence of serious comorbidities.  The bleeding index is 'intermediate risk'.  Positive CHADS2 values include History of HTN and History of Diabetes.  Negative CHADS2 values include Age > 68 years old.  Anticoagulation responsible provider: Angelena Form MD, Harrell Gave.  INR POC: 3.0.  Cuvette Lot#: [id number].  Exp: 07/2010.    Anticoagulation Management Assessment/Plan:      The patient's current anticoagulation dose is Warfarin sodium 5 mg tabs: use asd.  The target INR is 2.0-3.0.  The next INR is due 07/12/2009.  Anticoagulation instructions were given to patient.  Results were reviewed/authorized by Andrea March, RN, BSN.  She was notified by Andrea March RN.         Prior Anticoagulation Instructions: The patient is to continue with the same dose of coumadin.  This dosage includes: half a tablet (2.5mg ) every day except 1 tablet on Mondays and Fridays.  Current Anticoagulation Instructions: INR 3.0  Take 1/2 tablet today then resume same dosage 1/2 tablet daily except 1 tablet on Mondays and Fridays.   Recheck in 4 weeks.

## 2010-04-21 NOTE — Medication Information (Signed)
Summary: rov/sp   Anticoagulant Therapy  Managed by: Gypsy Lore, PharmD Referring MD: Jenell Milliner, MD PCP: Rowe Clack MD Supervising MD: Ron Parker MD, Dellis Filbert Indication 1: Pulmonary Embolism Lab Used: [facility] [place] Site: [place] INR POC 2.4 INR RANGE 2.0-3.0  Dietary changes: no    Health status changes: no    Bleeding/hemorrhagic complications: no    Recent/future hospitalizations: no    Any changes in medication regimen? no    Recent/future dental: no  Any missed doses?: no       Is patient compliant with meds? yes       Allergies: 1)  ! Morphine 2)  ! Demerol 3)  ! * Anaprex  Anticoagulation Management History:      The patient is taking warfarin and comes in today for a routine follow up visit.  Positive risk factors for bleeding include an age of 68 years or older and presence of serious comorbidities.  The bleeding index is 'intermediate risk'.  Positive CHADS2 values include History of HTN and History of Diabetes.  Negative CHADS2 values include Age > 68 years old.  Her last INR was 1.8.  Anticoagulation responsible Aerilynn Goin: Ron Parker MD, Dellis Filbert.  INR POC: 2.4.  Cuvette Lot#: [id number].  Exp: 02/2011.    Anticoagulation Management Assessment/Plan:      The patient's current anticoagulation dose is Warfarin sodium 5 mg tabs: use asd.  The target INR is 2.0-3.0.  The next INR is due 03/08/2010.  Anticoagulation instructions were given to patient.  Results were reviewed/authorized by Gypsy Lore, PharmD.  She was notified by Gypsy Lore PharmD.         Prior Anticoagulation Instructions: INR 1.8. Patient to increase dose to 1tab Mon, Wed and Friday. On Sun,Tue,Thur and Sat to 1/2 tab for those days.  Recheck in [place].  Current Anticoagulation Instructions: INR 2.4  Continue taking Coumadin 0.5 tab (2.5 mg) on Sun, Tues, Thur, Sat and Coumadin 1 tab (5 mg) on Mon, Wed, Fri. Return to clinic in 3 weeks.

## 2010-04-21 NOTE — Medication Information (Signed)
Summary: Andrea Simmons  Anticoagulant Therapy  Managed by: Freddrick March, RN, BSN Referring MD: Annamaria Boots MD, Berton Mount PCP: Rowe Clack MD Supervising MD: Burt Knack MD, Legrand Como Indication 1: Pulmonary Embolism Lab Used: [facility] Site: [place] INR POC 2.1 INR RANGE 2.0-3.0  Dietary changes: no    Health status changes: no    Bleeding/hemorrhagic complications: no    Recent/future hospitalizations: no    Any changes in medication regimen? no    Recent/future dental: no  Any missed doses?: no       Is patient compliant with meds? yes       Allergies: 1)  ! Morphine 2)  ! Demerol 3)  ! * Anaprex  Anticoagulation Management History:      The patient is taking warfarin and comes in today for a routine follow up visit.  Positive risk factors for bleeding include an age of 68 years or older and presence of serious comorbidities.  The bleeding index is 'intermediate risk'.  Positive CHADS2 values include History of HTN and History of Diabetes.  Negative CHADS2 values include Age > 68 years old.  Anticoagulation responsible provider: Burt Knack MD, Legrand Como.  INR POC: 2.1.  Cuvette Lot#: [id number].  Exp: 08/2010.    Anticoagulation Management Assessment/Plan:      The patient's current anticoagulation dose is Warfarin sodium 5 mg tabs: use asd.  The target INR is 2.0-3.0.  The next INR is due 08/02/2009.  Anticoagulation instructions were given to patient.  Results were reviewed/authorized by Freddrick March, RN, BSN.  She was notified by Freddrick March RN.         Prior Anticoagulation Instructions: INR 3.0  Take 1/2 tablet today then resume same dosage 1/2 tablet daily except 1 tablet on Mondays and Fridays.  Recheck in 4 weeks.    Current Anticoagulation Instructions: INR 2.1  Continue on same dosage 1/2 tablet daily except 1 tablet on Mondays and Fridays.  Recheck in 4 weeks.

## 2010-04-21 NOTE — Medication Information (Signed)
Summary: rov/tm   Anticoagulant Therapy  Managed by: Porfirio Oar, PharmD Referring MD: Jenell Milliner, MD PCP: Rowe Clack MD Supervising MD: Lovena Le MD, Carleene Overlie Indication 1: Pulmonary Embolism Lab Used: [facility] Site: [place] INR POC 1.6 INR RANGE 2.0-3.0  Dietary changes: no    Health status changes: no    Bleeding/hemorrhagic complications: no    Recent/future hospitalizations: no    Any changes in medication regimen? no    Recent/future dental: no  Any missed doses?: no       Is patient compliant with meds? yes       Allergies: 1)  ! Morphine 2)  ! Demerol 3)  ! * Anaprex  Anticoagulation Management History:      The patient is taking warfarin and comes in today for a routine follow up visit.  Positive risk factors for bleeding include an age of 20 years or older and presence of serious comorbidities.  The bleeding index is 'intermediate risk'.  Positive CHADS2 values include History of HTN and History of Diabetes.  Negative CHADS2 values include Age > 46 years old.  Her last INR was 2.3.  Anticoagulation responsible Andrea Simmons: Lovena Le MD, Carleene Overlie.  INR POC: 1.6.  Cuvette Lot#: [id number].  Exp: 02/2011.    Anticoagulation Management Assessment/Plan:      The patient's current anticoagulation dose is Warfarin sodium 5 mg tabs: use asd.  The target INR is 2.0-3.0.  The next INR is due 02/02/2010.  Anticoagulation instructions were given to patient.  Results were reviewed/authorized by Porfirio Oar, PharmD.  She was notified by Porfirio Oar PharmD.         Prior Anticoagulation Instructions: INR 2.2 Continue 2.5mg s daily except 5mg s on Mondays and Fridays. REcheck in 4 weeks.   Current Anticoagulation Instructions: INR 1.6  Take 1 tablet today and tomorrow then resume same dose of 1/2 tablet every day except 1 tablet on Monday and Friday.  Recheck INR in 2-3 weeks.

## 2010-04-21 NOTE — Medication Information (Signed)
Summary: rov/mlw  Anticoagulant Therapy  Managed by: Porfirio Oar, PharmD Referring MD: Jenell Milliner, MD PCP: Rowe Clack MD Supervising MD: Harrington Challenger MD, Nevin Bloodgood Indication 1: Pulmonary Embolism Lab Used: [facility] Site: [place] INR POC 2.2 INR RANGE 2.0-3.0  Dietary changes: no    Health status changes: no    Bleeding/hemorrhagic complications: no    Recent/future hospitalizations: no    Any changes in medication regimen? yes       Details: changed to simvastatin 40mg   Recent/future dental: no  Any missed doses?: no       Is patient compliant with meds? yes       Allergies: 1)  ! Morphine 2)  ! Demerol 3)  ! * Anaprex  Anticoagulation Management History:      The patient is taking warfarin and comes in today for a routine follow up visit.  Positive risk factors for bleeding include an age of 68 years or older and presence of serious comorbidities.  The bleeding index is 'intermediate risk'.  Positive CHADS2 values include History of HTN and History of Diabetes.  Negative CHADS2 values include Age > 68 years old.  Her last INR was 2.3.  Anticoagulation responsible provider: Harrington Challenger MD, Nevin Bloodgood.  INR POC: 2.2.  Cuvette Lot#: [id number].  Exp: 11/2010.    Anticoagulation Management Assessment/Plan:      The patient's current anticoagulation dose is Warfarin sodium 5 mg tabs: use asd.  The target INR is 2.0-3.0.  The next INR is due 10/26/2009.  Anticoagulation instructions were given to patient.  Results were reviewed/authorized by Porfirio Oar, PharmD.  She was notified by Lind Covert.         Prior Anticoagulation Instructions: INR 2  The patient is to continue with the same dose of coumadin.  This dosage includes: 0.5 tab daily (2.5 mg), except 1 tab on Mondays and Fridays.   Next appointment Monday, July 11th at 1:30 pm.   Current Anticoagulation Instructions: INR 2.2  Continue 1 tab on Monday and Friday and 1/2 tab on Sunday, Tuesday,  Wednesday, Thursday, and Saturday.  Re-check INR in 4 weeks.

## 2010-04-21 NOTE — Medication Information (Signed)
Summary: Andrea Simmons  Medications Added ASPIR-LOW 81 MG TBEC (ASPIRIN) Take one tablet daily      Allergies Added:  Anticoagulant Therapy  Managed by: Roxanne Gates, PharmD Referring MD: Jenell Milliner, MD PCP: Rowe Clack MD Supervising MD: Angelena Form MD, Harrell Gave Indication 1: Pulmonary Embolism Lab Used: [facility] Site: [place] INR POC 2.3 INR RANGE 2.0-3.0  Dietary changes: no    Health status changes: no    Bleeding/hemorrhagic complications: no    Recent/future hospitalizations: no    Any changes in medication regimen? yes       Details: Started ASA 81   Recent/future dental: no  Any missed doses?: no       Is patient compliant with meds? yes       Current Medications (verified): 1)  Diovan Hct 320-25 Mg Tabs (Valsartan-Hydrochlorothiazide) .Marland Kitchen.. 1 Tab Once Daily 2)  Hydrocodone-Acetaminophen 10-650 Mg Tabs (Hydrocodone-Acetaminophen) .Marland Kitchen.. 1 By Mouth Four Times Daily 3)  Celebrex 200 Mg Caps (Celecoxib) .Marland Kitchen.. 1 By Mouth Once Daily 4)  Lorazepam 0.5 Mg Tabs (Lorazepam) .Marland Kitchen.. 1 By Mouth At Bedtime 5)  Gabapentin 300 Mg Caps (Gabapentin) .Marland Kitchen.. 1 By Mouth Once Daily As Needed For Pain 6)  Cymbalta 30 Mg Cpep (Duloxetine Hcl) .... 2 Cap in The Am 1 By Mouth Pm 7)  Omeprazole 20 Mg Cpdr (Omeprazole) .Marland Kitchen.. 1 By Mouth Once Daily 8)  Trilipix 135 Mg Cpdr (Choline Fenofibrate) .Marland Kitchen.. 1 By Mouth Once Daily 9)  Warfarin Sodium 5 Mg Tabs (Warfarin Sodium) .... Use Asd 10)  Simvastatin 80 Mg Tabs (Simvastatin) .... Take One Tablet By Mouth Daily At Bedtime 11)  Levothyroxine Sodium 25 Mcg Tabs (Levothyroxine Sodium) .Marland Kitchen.. 1 By Mouth Once Daily 12)  Fish Oil 500 Mg Caps (Omega-3 Fatty Acids) .Marland Kitchen.. 1 By Mouth Three Times A Day 13)  Glucophage 500 Mg Tabs (Metformin Hcl) .... 2 By Mouth Two Times A Day 14)  Aspir-Low 81 Mg Tbec (Aspirin) .... Take One Tablet Daily  Allergies (verified): 1)  ! Morphine 2)  ! Demerol 3)  ! *  Anaprex  Anticoagulation Management History:      The patient is taking warfarin and comes in today for a routine follow up visit.  Positive risk factors for bleeding include an age of 14 years or older and presence of serious comorbidities.  The bleeding index is 'intermediate risk'.  Positive CHADS2 values include History of HTN and History of Diabetes.  Negative CHADS2 values include Age > 71 years old.  Today's INR is 2.3.  Anticoagulation responsible provider: Angelena Form MD, Harrell Gave.  INR POC: 2.3.  Cuvette Lot#: [id number].  Exp: 11/17/2009.    Anticoagulation Management Assessment/Plan:      The patient's current anticoagulation dose is Warfarin sodium 5 mg tabs: use asd.  The target INR is 2.0-3.0.  The next INR is due 08/30/2009.  Anticoagulation instructions were given to patient.  Results were reviewed/authorized by Roxanne Gates, PharmD.  She was notified by Roxanne Gates.         Prior Anticoagulation Instructions: INR 2.1  Continue on same dosage 1/2 tablet daily except 1 tablet on Mondays and Fridays.  Recheck in 4 weeks.    Current Anticoagulation Instructions: INR = 2.3  The patient is to continue with the same dose of coumadin.  This dosage includes:  Take half a tablet every day except for monday and friday take 1 tablet.

## 2010-04-21 NOTE — Progress Notes (Signed)
Summary: CHANGE PCP  Phone Note Call from Patient Call back at Home Phone [phone]   Caller: Patient Summary of Call: PT IS REQUESTING TO CHANGE PCP FROM DR. JOHN TO DR. Asa Lente.  PT HAS MEDICARE.  OK TO CHANGE?   [phone] Initial call taken by: Glena Norfolk,  April 27, 2009 5:00 PM  Follow-up for Phone Call        ok with me Follow-up by: Biagio Borg MD,  April 27, 2009 5:01 PM  Additional Follow-up for Phone Call Additional follow up Details #1::        ok Additional Follow-up by: Rowe Clack MD,  April 27, 2009 5:04 PM    Additional Follow-up for Phone Call Additional follow up Details #2::    PT IS AWARE/ SHE MADE AN APPT WITH DR Kyle. Follow-up by: Glena Norfolk,  April 28, 2009 1:44 PM

## 2010-04-21 NOTE — Medication Information (Signed)
Summary: rov/sl   Anticoagulant Therapy  Managed by: Javier Glazier, PharmD Referring MD: Jenell Milliner, MD PCP: Rowe Clack MD Supervising MD: Verl Blalock MD,Thomas Indication 1: Pulmonary Embolism Lab Used: [facility] [place] Site: [place] INR POC 2.7 INR RANGE 2.0-3.0  Dietary changes: no    Health status changes: no    Bleeding/hemorrhagic complications: yes       Details: Gets bloodshot eye on left on occasion that concern her  Recent/future hospitalizations: no    Any changes in medication regimen? no    Recent/future dental: no  Any missed doses?: no       Is patient compliant with meds? yes       Allergies: 1)  ! Morphine 2)  ! Demerol 3)  ! * Anaprex  Anticoagulation Management History:      The patient is taking warfarin and comes in today for a routine follow up visit.  Positive risk factors for bleeding include an age of 22 years or older and presence of serious comorbidities.  The bleeding index is 'intermediate risk'.  Positive CHADS2 values include History of HTN and History of Diabetes.  Negative CHADS2 values include Age > 38 years old.  Her last INR was 1.8 and today's INR is 2.7.  Anticoagulation responsible provider: Wall MD,Thomas.  INR POC: 2.7.  Cuvette Lot#: [id number].  Exp: 02/2011.    Anticoagulation Management Assessment/Plan:      The patient's current anticoagulation dose is Warfarin sodium 5 mg tabs: use asd.  The target INR is 2.0-3.0.  The next INR is due 04/05/2010.  Anticoagulation instructions were given to patient.  Results were reviewed/authorized by Javier Glazier, PharmD.         Prior Anticoagulation Instructions: INR 2.4  Continue taking Coumadin 0.5 tab (2.5 mg) on Sun, Tues, Thur, Sat and Coumadin 1 tab (5 mg) on Mon, Wed, Fri. Return to clinic in 3 weeks.   Current Anticoagulation Instructions: INR 2.7 (goal 2-3)  Continue taking 1/2 tablet everyday except take 1 tablet on Mondays, Wednesdays, and  Fridays.  Return to clinic in 4 weeks on Tuesday, January 17th at 2:00PM.

## 2010-04-21 NOTE — Assessment & Plan Note (Signed)
Summary: Cardiology Nuclear Study  Nuclear Med Background Indications for Stress Test: Evaluation for Ischemia   History: Echo, Myocardial Perfusion Study  History Comments: 1/09 Echo:EF=65%; 3/09 [id number], EF=67%; h/o PE  Symptoms: Chest Pain, DOE, SOB  Symptoms Comments: Last episode of CP:2 weeks ago.   Nuclear Pre-Procedure Cardiac Risk Factors: Family History - CAD, Hypertension, Lipids, NIDDM Caffeine/Decaff Intake: None NPO After: 8:30 AM Lungs: Clear IV 0.9% NS with Angio Cath: 22g     IV Site: (R) AC IV Started by: Perrin Maltese EMT-P Chest Size (in) 38     Cup Size B     Height (in): 65 Weight (lb): 162 BMI: 27.06  Nuclear Med Study 1 or 2 day study:  1 day     Stress Test Type:  Stress Reading MD:  Dorris Carnes, MD     Referring MD:  Jenell Milliner, MD Resting Radionuclide:  Technetium 21m Tetrofosmin     Resting Radionuclide Dose:  10.6 mCi  Stress Radionuclide:  Technetium 33m Tetrofosmin     Stress Radionuclide Dose:  33 mCi   Stress Protocol Exercise Time (min):  6:30 min     Max HR:  134 bpm     Predicted Max HR:  0000000 bpm  Max Systolic BP: 0000000 mm Hg     Percent Max HR:  87.58 %     METS: 7.7 Rate Pressure Product:  23852    Stress Test Technologist:  Valetta Fuller CMA-N     Nuclear Technologist:  Charlton Amor CNMT  Rest Procedure  Myocardial perfusion imaging was performed at rest 45 minutes following the intravenous administration of Myoview Technetium 1m Tetrofosmin.  Stress Procedure  The patient exercised for 6:30.  The patient stopped due to fatigue and denied any chest pain.  There were no significant ST-T wave changes.  Myoview was injected at peak exercise and myocardial perfusion imaging was performed after a brief delay.  QPS Raw Data Images:  Soft tissue (diaphragm, breat tissue) surround heart. Stress Images:  There is normal uptake in all areas. Rest Images:  Normal homogeneous uptake in all areas of the myocardium. Subtraction (SDS):   No evidence of ischemia. Transient Ischemic Dilatation:  .80  (Normal <1.22)  Lung/Heart Ratio:  .13  (Normal <0.45)  Quantitative Gated Spect Images QGS EDV:  76 ml QGS ESV:  25 ml QGS EF:  67 %   Overall Impression  Exercise Capacity: Good exercise capacity. BP Response: Normal blood pressure response. Clinical Symptoms: No chest pain ECG Impression: No significant ST segment change suggestive of ischemia. Overall Impression: Normal stress nuclear study.  Appended Document: Cardiology Nuclear Study Good results. Reassurance.  Appended Document: Cardiology Nuclear Study PT AWARE./CY

## 2010-04-21 NOTE — Medication Information (Signed)
Summary: rov/mb      Allergies Added:  Anticoagulant Therapy  Managed by: Otis Dials, PharmD Referring MD: Jenell Milliner, MD PCP: Rowe Clack MD Supervising MD: Percival Spanish MD, Jeneen Rinks Indication 1: Pulmonary Embolism Lab Used: [facility] Site: [place] INR POC 2 INR RANGE 2.0-3.0  Dietary changes: no    Health status changes: no    Bleeding/hemorrhagic complications: no    Recent/future hospitalizations: no    Any changes in medication regimen? no    Recent/future dental: no  Any missed doses?: no       Is patient compliant with meds? yes       Current Medications (verified): 1)  Diovan Hct 320-25 Mg Tabs (Valsartan-Hydrochlorothiazide) .Marland Kitchen.. 1 Tab Once Daily 2)  Hydrocodone-Acetaminophen 10-650 Mg Tabs (Hydrocodone-Acetaminophen) .Marland Kitchen.. 1 By Mouth Four Times Daily 3)  Celebrex 200 Mg Caps (Celecoxib) .Marland Kitchen.. 1 By Mouth Once Daily 4)  Lorazepam 0.5 Mg Tabs (Lorazepam) .Marland Kitchen.. 1 By Mouth At Bedtime 5)  Gabapentin 300 Mg Caps (Gabapentin) .Marland Kitchen.. 1 By Mouth Once Daily As Needed For Pain 6)  Cymbalta 30 Mg Cpep (Duloxetine Hcl) .... 2 Cap in The Am 1 By Mouth Pm 7)  Omeprazole 20 Mg Cpdr (Omeprazole) .Marland Kitchen.. 1 By Mouth Once Daily 8)  Trilipix 135 Mg Cpdr (Choline Fenofibrate) .Marland Kitchen.. 1 By Mouth Once Daily 9)  Warfarin Sodium 5 Mg Tabs (Warfarin Sodium) .... Use Asd 10)  Simvastatin 80 Mg Tabs (Simvastatin) .... Take One Tablet By Mouth Daily At Bedtime 11)  Levothyroxine Sodium 25 Mcg Tabs (Levothyroxine Sodium) .Marland Kitchen.. 1 By Mouth Once Daily 12)  Fish Oil 500 Mg Caps (Omega-3 Fatty Acids) .Marland Kitchen.. 1 By Mouth Three Times A Day 13)  Glucophage 500 Mg Tabs (Metformin Hcl) .... 2 By Mouth Two Times A Day 14)  Aspir-Low 81 Mg Tbec (Aspirin) .... Take One Tablet Daily  Allergies (verified): 1)  ! Morphine 2)  ! Demerol 3)  ! * Anaprex  Anticoagulation Management History:      The patient is taking warfarin and comes in today for a routine follow up visit.   Positive risk factors for bleeding include an age of 40 years or older and presence of serious comorbidities.  The bleeding index is 'intermediate risk'.  Positive CHADS2 values include History of HTN and History of Diabetes.  Negative CHADS2 values include Age > 47 years old.  Her last INR was 2.3.  Anticoagulation responsible provider: Percival Spanish MD, Jeneen Rinks.  INR POC: 2.  Exp: 11/17/2009.    Anticoagulation Management Assessment/Plan:      The patient's current anticoagulation dose is Warfarin sodium 5 mg tabs: use asd.  The target INR is 2.0-3.0.  The next INR is due 09/27/2009.  Anticoagulation instructions were given to patient.  Results were reviewed/authorized by Otis Dials, PharmD.  She was notified by Otis Dials.         Prior Anticoagulation Instructions: INR = 2.3  The patient is to continue with the same dose of coumadin.  This dosage includes:  Take half a tablet every day except for monday and friday take 1 tablet.  Current Anticoagulation Instructions: INR 2  The patient is to continue with the same dose of coumadin.  This dosage includes: 0.5 tab daily (2.5 mg), except 1 tab on Mondays and Fridays.   Next appointment Monday, July 11th at 1:30 pm.

## 2010-04-21 NOTE — Assessment & Plan Note (Signed)
Summary: cramps both legs/#/cd   Vital Signs:  Patient profile:   70 year old female Height:      65 inches (165.10 cm) Weight:      164.8 pounds (74.91 kg) O2 Sat:      94 % on Room air Temp:     98.1 degrees F (36.72 degrees C) oral Pulse rate:   64 / minute BP sitting:   122 / 70  (left arm) Cuff size:   regular  Vitals Entered By: Tomma Lightning RMA (January 20, 2010 11:36 AM)  O2 Flow:  Room air CC: Leg cramps Is Patient Diabetic? Yes Did you bring your meter with you today? No Pain Assessment Patient in pain? yes     Location: both leg Type: cramping   Primary Care Provider:  Rowe Clack MD  CC:  Leg cramps.  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  - chronic anticoag ongoing for same -no dyspnea or CP -?if needs repeat CT scan to re-eval pulm nodules seen on 2009 CT -- 08/2008 CT radiology report is stable  3) OA/fibro - follows with rheum for same - pain generally well controlled on current meds - most helped by steroid injection in L hip - hopes to put off THR for as long as possible  4) dyslipidemia- recent changes in statin med and dosing 09/2009 - change from simva 80 to lipitor 40, now crestor 20 - RUQ symptoms predated med changes - no muscle wekaness or other probs  hx Abdominal Pain 10/2009 - mostly improved, rare epidsodes now - RUQ and RLQ location - reviewed abd u/s 10/2009: fatty liver, no GB stones - pt feels symptoms related to constipation  Clinical Review Panels:  Complete Metabolic Panel   Glucose:  143 (10/25/2009)   Sodium:  142  (10/25/2009)   Potassium:  4.8 (10/25/2009)   Chloride:  103 (10/25/2009)   CO2:  33 (10/25/2009)   BUN:  30 (10/25/2009)   Creatinine:  1.0 (10/25/2009)   Albumin:  4.4 (10/25/2009)   Total Protein:  7.3 (10/25/2009)   Calcium:  9.5 (10/25/2009)   Total Bili:  0.5 (10/25/2009)   Alk Phos:  34 (10/25/2009)   SGPT (ALT):  20 (10/25/2009)   SGOT (AST):  27 (10/25/2009)   Current Medications (verified): 1)  Diovan Hct 320-25 Mg Tabs (Valsartan-Hydrochlorothiazide) .Marland Kitchen.. 1 Tab Once Daily 2)  Hydrocodone-Acetaminophen 10-650 Mg Tabs (Hydrocodone-Acetaminophen) .Marland Kitchen.. 1 By Mouth Four Times Daily 3)  Celebrex 200 Mg Caps (Celecoxib) .Marland Kitchen.. 1 By Mouth Once Daily 4)  Lorazepam 0.5 Mg Tabs (Lorazepam) .Marland Kitchen.. 1 By Mouth At Bedtime 5)  Gabapentin 300 Mg Caps (Gabapentin) .Marland Kitchen.. 1 By Mouth Once Daily As Needed For Pain 6)  Cymbalta 30 Mg Cpep (Duloxetine Hcl) .... 2 Cap in The Am 1 By Mouth Pm 7)  Omeprazole 20 Mg Cpdr (Omeprazole) .Marland Kitchen.. 1 By Mouth Once Daily 8)  Trilipix 135 Mg Cpdr (Choline Fenofibrate) .Marland Kitchen.. 1 By Mouth Once Daily 9)  Warfarin Sodium 5 Mg Tabs (Warfarin Sodium) .Marland KitchenMarland KitchenMarland Kitchen  Use Asd 10)  Levothyroxine Sodium 25 Mcg Tabs (Levothyroxine Sodium) .Marland Kitchen.. 1 By Mouth Once Daily 11)  Fish Oil 500 Mg Caps (Omega-3 Fatty Acids) .Marland Kitchen.. 1 By Mouth Three Times A Day 12)  Glucophage 500 Mg Tabs (Metformin Hcl) .... 2 By Mouth Two Times A Day 13)  Aspir-Low 81 Mg Tbec (Aspirin) .... Take One Tablet Daily 14)  Crestor 20 Mg Tabs (Rosuvastatin Calcium) .... Take 1 By Mouth Once Daily 15)  Multivitamins  Tabs (Multiple Vitamin) .... Take 1 By Mouth Once Daily 16)  Calcium 600 Mg Tabs (Calcium) .... Take 2 By Mouth Once Daily  Allergies (verified): 1)  ! Morphine 2)  ! Demerol 3)  ! 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    Physical Exam  General:  alert, well-developed, well-nourished, and cooperative to examination.    Eyes:  vision grossly intact; pupils equal, round and reactive to light.  conjunctiva and lids normal.    Lungs:  normal respiratory effort, no intercostal retractions or use of accessory muscles; normal breath sounds bilaterally - no crackles and no wheezes.   no intercostal retractions.   Heart:  normal rate, regular rhythm, no murmur, and no rub. BLE without edema. Msk:  no spasm appreciated B thighs, calfs - muscle strength normal and balance intact, gait normal   Impression & Recommendations:  Problem # 1:  LEG CRAMPS (ICD-729.82) pt with long hx same, worse in past 3 weeks - check lytes now - as pt describes same sensation as prior DVT - and recent subtheraputic INR, check doppler now if these normal, consider PT for stretch education - pt prefers home remedies - vinegar, mustard and/or tonic water - ok if it helps... Orders: TLB-Potassium (K+) (84132-K) TLB-Magnesium (Mg) (83735-MG) Doppler Referral (Doppler)  Problem # 2:  PULMONARY EMBOLISM (ICD-415.19)  hx recurrent dx 2008 and 2009 - on chronic anticoag - but INR 1.6 last week concerning to pt - has had dose adjusted by [facility] CC Her updated medication list for this problem includes:    Warfarin Sodium 5 Mg Tabs (Warfarin sodium) ..... Use asd    Aspir-low 81 Mg Tbec (Aspirin) .Marland Kitchen... Take one tablet daily  Orders: Doppler Referral (Doppler)  Reviewed the following: PT: 19.2 (10/07/2008)   INR: 2.3 (08/02/2009)    Coumadin Dose (weekly): 22.50 mg (01/12/2010) Prior Coumadin Dose (weekly): 22.50 mg (01/12/2010) Next Protime: 02/02/2010  (dated on 01/12/2010)  Problem # 3:  RUQ PAIN (ICD-789.01)  10/2009 abd [place] - fatty liver, no GB stones (reviewed with pt today) symptoms overall improved - ?related to constipation events per pt cont PPI (reports 100% compliance)  Complete Medication List: 1)  Diovan Hct 320-25 Mg Tabs (Valsartan-hydrochlorothiazide) .Marland Kitchen.. 1 tab once daily 2)  Hydrocodone-acetaminophen 10-650 Mg Tabs (Hydrocodone-acetaminophen) .Marland Kitchen.. 1 by mouth four times daily 3)  Celebrex 200 Mg Caps (Celecoxib) .Marland Kitchen.. 1 by mouth once daily 4)  Lorazepam 0.5 Mg Tabs (Lorazepam) .Marland Kitchen.. 1 by mouth at bedtime 5)  Gabapentin 300 Mg Caps (Gabapentin) .Marland Kitchen.. 1 by mouth once daily as needed for pain  6)  Cymbalta 30 Mg Cpep (Duloxetine hcl) .... 2 cap in the am 1 by mouth pm 7)  Omeprazole 20 Mg Cpdr (Omeprazole) .Marland Kitchen.. 1 by mouth once daily 8)  Trilipix 135 Mg Cpdr (Choline fenofibrate) .Marland Kitchen.. 1 by mouth once daily 9)  Warfarin Sodium 5 Mg Tabs (Warfarin sodium) .... Use asd 10)  Levothyroxine Sodium 25 Mcg Tabs (Levothyroxine sodium) .Marland Kitchen.. 1 by mouth once daily 11)  Fish Oil 500 Mg Caps (Omega-3 fatty acids) .Marland Kitchen.. 1 by mouth three times a day 12)  Glucophage 500 Mg Tabs (Metformin hcl) .... 2 by mouth two times a day 13)  Aspir-low 81 Mg Tbec (Aspirin) .... Take one tablet daily 14)  Crestor 20 Mg Tabs (Rosuvastatin calcium) .... Take 1 by mouth once daily 15)  Multivitamins Tabs (Multiple vitamin) .... Take 1 by mouth once daily 16)  Calcium 600 Mg Tabs (Calcium) .... Take 2 by mouth once daily  Other Orders: Flu Vaccine 103yrs + MEDICARE PATIENTS [id number]) Administration Flu vaccine - MCR [id number])  Patient Instructions: 1)  it was good to see you today. 2)  we'll make referral tfor leg dopplers to look for blood clots. Our office will contact you regarding this appointment once made.  3)  test(s) ordered today - your results will be posted on the phone tree for review in 48-72 hours from the time of test completion; call [phone] and  enter your 9 digit MRN (listed above on this page, just below your name); if any changes need to be made or there are abnormal results, you will be contacted directly.  4)  if these tests normal, continue your home treatments as ongoing for cramp symptoms  5)  if still not improved, will make referral to PT (physical therapy) for stretches education 6)  Please schedule a follow-up appointment as needed (or as previously scheduled).   Orders Added: 1)  TLB-Potassium (K+) [84132-K] 2)  TLB-Magnesium (Mg) [83735-MG] 3)  Flu Vaccine 87yrs + MEDICARE PATIENTS [Q2039] 4)  Administration Flu vaccine - MCR [id number] 5)  Doppler Referral [Doppler] 6)  Est. Patient Level IV [id number] Flu Vaccine Consent Questions     Do you have a history of severe allergic reactions to this vaccine? no    Any prior history of allergic reactions to egg and/or gelatin? no    Do you have a sensitivity to the preservative Thimersol? no    Do you have a past history of Guillan-Barre Syndrome? no    Do you currently have an acute febrile illness? no    Have you ever had a severe reaction to latex? no    Vaccine information given and explained to patient? yes    Are you currently pregnant? no    Lot Number:[id number]   Exp Date:09/17/2010   Site Given  Left Deltoid IM 2)  TLB-Magnesium (Mg) [83735-MG] 3)  Doppler Referral [Doppler] 4)  Flu Vaccine 82yrs + MEDICARE PATIENTS [Q2039] 5)  Administration Flu vaccine - MCR [id number]   .lbmedflu

## 2010-04-21 NOTE — Assessment & Plan Note (Signed)
Summary: PAIN ON RIGHT SIDE/NWS  #   Vital Signs:  Patient profile:   70 year old female Height:      65 inches (165.10 cm) Weight:      163.0 pounds (74.09 kg) O2 Sat:      97 % on Room air Temp:     98.4 degrees F (36.89 degrees C) oral Pulse rate:   80 / minute BP sitting:   120 / 72  (right arm) Cuff size:   regular  Vitals Entered By: Tomma Lightning RMA (October 25, 2009 2:32 PM)  O2 Flow:  Room air CC: (R) side pain, Abdominal pain Is Patient Diabetic? Yes Did you bring your meter with you today? No Pain Assessment Patient in pain? yes     Location: (R) side Type: aching Comments Pt states she been having ongoing (R) side pain x's 4 weeks   Primary Care Provider:  Rowe Clack MD  CC:  (R) side pain and Abdominal pain.  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  - chronic anticoag ongoing for same -no dyspnea or CP -?if needs repeat CT scan to re-eval pulm nodules seen on 2009 CT -- 08/2008 CT radiology report is stable  3) OA/fibro - follows with rheum for same - pain generally well controlled on current meds - most helped by steroid injection in L hip - hopes to put off THR for as long as possible  4) dyslipidemia- recent changes in statin med and dosing 09/2009 - change from simva 80 to lipitor 40, now crestor 20 - RUQ symptoms predated med changes - no muscle wekaness or other probs  Abdominal Pain      This is a 70 year old woman who presents with Abdominal pain.  The symptoms began 4 weeks ago.  On a scale of 1 to 10, the intensity is described as a 4.  The patient reports constipation (chronic), but denies nausea, vomiting, diarrhea, anorexia, and hematemesis.  The location of the pain is right upper quadrant. Pain does not  radiate anywhere.  The pain is described as intermittent and burning in quality.  The patient denies the following symptoms: fever, dysuria, and chest pain.  The pain is worse with jarring of the abdomen such as climbing stairs.  Does not believe the pain is precipitated by food choices.  The pain is better with rest, unchanged with defecation or fasting.    Clinical Review Panels:  Immunizations   Last Tetanus Booster:  Td (08/27/2008)   Last Flu Vaccine:  Historical (12/18/2008)   Last Pneumovax:  given (12/19/2007)  Lipid Management   Cholesterol:  209 (04/19/2009)   LDL (bad choesterol):  125 (04/19/2009)   HDL (good cholesterol):  33.0 (04/19/2009)   Triglycerides:  256 (04/19/2009)  Diabetes Management   HgBA1C:  6.5 (08/27/2008)   Creatinine:  1.1 (04/19/2009)   Last Foot Exam:  yes (01/14/2009)   Last Flu Vaccine:  Historical (12/18/2008)   Last Pneumovax:  given (12/19/2007)  CBC   WBC:  4.7 (08/27/2008)   RBC:  3.90 (08/27/2008)   Hgb:  12.6 (08/27/2008)   Hct:  36.1 (08/27/2008)   Platelets:  177.0 (08/27/2008)   MCV  92.6 (  08/27/2008)   MCHC  34.8 (08/27/2008)   RDW  11.9 (08/27/2008)   PMN:  51.0 (08/27/2008)   Lymphs:  38.5 (08/27/2008)   Monos:  8.5 (08/27/2008)   Eosinophils:  1.3 (08/27/2008)   Basophil:  0.7 (08/27/2008)  Complete Metabolic Panel   Glucose:  109 (04/19/2009)   Sodium:  140 (04/19/2009)   Potassium:  4.4 (04/19/2009)   Chloride:  103 (04/19/2009)   CO2:  32 (04/19/2009)   BUN:  23 (04/19/2009)   Creatinine:  1.1 (04/19/2009)   Albumin:  4.2 (08/27/2008)   Total Protein:  7.1 (08/27/2008)   Calcium:  9.2 (04/19/2009)   Total Bili:  0.8 (08/27/2008)   Alk Phos:  37 (04/19/2009)   SGPT (ALT):  31 (04/19/2009)   SGOT (AST):  25 (04/19/2009)   Current Medications (verified): 1)  Diovan Hct 320-25 Mg Tabs (Valsartan-Hydrochlorothiazide) .Marland Kitchen.. 1 Tab Once Daily 2)  Hydrocodone-Acetaminophen 10-650 Mg Tabs (Hydrocodone-Acetaminophen)  .Marland Kitchen.. 1 By Mouth Four Times Daily 3)  Celebrex 200 Mg Caps (Celecoxib) .Marland Kitchen.. 1 By Mouth Once Daily 4)  Lorazepam 0.5 Mg Tabs (Lorazepam) .Marland Kitchen.. 1 By Mouth At Bedtime 5)  Gabapentin 300 Mg Caps (Gabapentin) .Marland Kitchen.. 1 By Mouth Once Daily As Needed For Pain 6)  Cymbalta 30 Mg Cpep (Duloxetine Hcl) .... 2 Cap in The Am 1 By Mouth Pm 7)  Omeprazole 20 Mg Cpdr (Omeprazole) .Marland Kitchen.. 1 By Mouth Once Daily 8)  Trilipix 135 Mg Cpdr (Choline Fenofibrate) .Marland Kitchen.. 1 By Mouth Once Daily 9)  Warfarin Sodium 5 Mg Tabs (Warfarin Sodium) .... Use Asd 10)  Levothyroxine Sodium 25 Mcg Tabs (Levothyroxine Sodium) .Marland Kitchen.. 1 By Mouth Once Daily 11)  Fish Oil 500 Mg Caps (Omega-3 Fatty Acids) .Marland Kitchen.. 1 By Mouth Three Times A Day 12)  Glucophage 500 Mg Tabs (Metformin Hcl) .... 2 By Mouth Two Times A Day 13)  Aspir-Low 81 Mg Tbec (Aspirin) .... Take One Tablet Daily 14)  Crestor 20 Mg Tabs (Rosuvastatin Calcium) .... Take 1 By Mouth Once Daily 15)  Multivitamins  Tabs (Multiple Vitamin) .... Take 1 By Mouth Once Daily 16)  Calcium 600 Mg Tabs (Calcium) .... Take 2 By Mouth Once Daily  Allergies (verified): 1)  ! Morphine 2)  ! Demerol 3)  ! 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     Physical Exam  General:  alert, well-developed, well-nourished, well-hydrated, appropriate dress, normal appearance, healthy-appearing, cooperative to examination, and good hygiene.   Lungs:  normal respiratory effort, no intercostal retractions or use of accessory muscles; normal breath sounds bilaterally - no crackles and no wheezes.    Heart:  normal rate, regular rhythm, no murmur, and no rub. BLE without edema. Abdomen:  soft, mildly tender to deep palp over RUQ, normal bowel sounds, no distention; no masses and no appreciable hepatomegaly or splenomegaly.     Impression & Recommendations:  Problem # 1:  RUQ PAIN (ICD-789.01)  ?bilary colic most likely ddx -  check labs now and arrange for abd [place] -  pt may or may not wish to pursue chole surg - pain not severe - "i just want to know what is causing it" cont PPI (reports 100% compliance) Orders: TLB-Hepatic/Liver Function Pnl (80076-HEPATIC) TLB-CBC Platelet - w/Differential (85025-CBCD) TLB-BMP (Basic Metabolic Panel-BMET) (99991111) Radiology Referral (Radiology)  Complete Medication List: 1)  Diovan Hct 320-25 Mg Tabs (Valsartan-hydrochlorothiazide) .Marland Kitchen.. 1 tab once daily 2)  Hydrocodone-acetaminophen 10-650 Mg Tabs (Hydrocodone-acetaminophen) .Marland Kitchen.. 1 by mouth four times daily 3)  Celebrex 200 Mg Caps (Celecoxib) .Marland Kitchen.. 1 by mouth once daily 4)  Lorazepam 0.5 Mg Tabs (Lorazepam) .Marland Kitchen.. 1 by mouth at bedtime 5)  Gabapentin 300 Mg Caps (Gabapentin) .Marland Kitchen.. 1 by mouth once daily as needed for pain 6)  Cymbalta 30 Mg Cpep (Duloxetine hcl) .... 2 cap in the am 1 by mouth pm 7)  Omeprazole 20 Mg Cpdr (Omeprazole) .Marland Kitchen.. 1 by mouth once daily 8)  Trilipix 135 Mg Cpdr (Choline fenofibrate) .Marland Kitchen.. 1 by mouth once daily 9)  Warfarin Sodium 5 Mg Tabs (Warfarin sodium) .... Use asd 10)  Levothyroxine Sodium 25 Mcg Tabs (Levothyroxine sodium) .Marland Kitchen.. 1 by mouth once daily 11)  Fish Oil 500 Mg Caps (Omega-3 fatty acids) .Marland Kitchen.. 1 by mouth three times  a day 12)  Glucophage 500 Mg Tabs (Metformin hcl) .... 2 by mouth two times a day 13)  Aspir-low 81 Mg Tbec (Aspirin) .... Take one tablet daily 14)  Crestor 20 Mg Tabs (Rosuvastatin calcium) .... Take 1 by mouth once daily 15)  Multivitamins Tabs (Multiple vitamin) .... Take 1 by mouth once daily 16)  Calcium 600 Mg Tabs (Calcium) .... Take 2 by mouth once daily  Patient Instructions: 1)  it was good to see you today. 2)  test(s) ordered today - your results will be called to you.  3)  we'll make referral to ultrasound to look for possible gallbladder problems or stones. Our office will contact you regarding this appointment once made.  4)  Please schedule a follow-up appointment in 6 months, sooner if problems.

## 2010-04-21 NOTE — Medication Information (Signed)
Summary: [email or web address]  Medications Added SIMVASTATIN 80 MG TABS (SIMVASTATIN) Take one tablet by mouth daily at bedtime      Allergies Added:  Anticoagulant Therapy  Managed by: Merdis Delay, RN Referring MD: Annamaria Boots MD, Berton Mount PCP: Dr. Loma Sender MD: Olevia Perches MD, Erasmus Bistline Indication 1: Pulmonary Embolism Lab Used: [facility] [place] [facility] Site: [place] INR POC 2.9 INR RANGE 2.0-3.0  Dietary changes: no    Health status changes: no    Bleeding/hemorrhagic complications: yes       Details: over 2 weeks ago pt had a bloodshot left eye  Recent/future hospitalizations: no    Any changes in medication regimen? no    Recent/future dental: no  Any missed doses?: no       Is patient compliant with meds? yes       Current Medications (verified): 1)  Diovan Hct 320-25 Mg Tabs (Valsartan-Hydrochlorothiazide) .Marland Kitchen.. 1 Tab Once Daily 2)  Hydrocodone-Acetaminophen 10-650 Mg Tabs (Hydrocodone-Acetaminophen) .Marland Kitchen.. 1 By Mouth Four Times Daily 3)  Celebrex 200 Mg Caps (Celecoxib) .Marland Kitchen.. 1 By Mouth Once Daily 4)  Lorazepam 0.5 Mg Tabs (Lorazepam) .Marland Kitchen.. 1 By Mouth At Bedtime 5)  Gabapentin 300 Mg Caps (Gabapentin) .Marland Kitchen.. 1 By Mouth Once Daily As Needed For Pain 6)  Cymbalta 30 Mg Cpep (Duloxetine Hcl) .... 2 Cap in The Am 1 By Mouth Pm 7)  Omeprazole 20 Mg Cpdr (Omeprazole) .Marland Kitchen.. 1 By Mouth Once Daily 8)  Trilipix 135 Mg Cpdr (Choline Fenofibrate) .Marland Kitchen.. 1 By Mouth Once Daily 9)  Warfarin Sodium 5 Mg Tabs (Warfarin Sodium) .... Use Asd 10)  Simvastatin 80 Mg Tabs (Simvastatin) .... Take One Tablet By Mouth Daily At Bedtime 11)  Levothyroxine Sodium 25 Mcg Tabs (Levothyroxine Sodium) .Marland Kitchen.. 1 By Mouth Once Daily 12)  Fish Oil 500 Mg Caps (Omega-3 Fatty Acids) .Marland Kitchen.. 1 By Mouth Three Times A Day 13)  Glucophage 500 Mg Tabs (Metformin Hcl) .... 2 By Mouth Two Times A Day  Allergies (verified): 1)  ! Morphine 2)  ! Demerol 3)  ! * Anaprex  Anticoagulation Management History:      The  patient is taking warfarin and comes in today for a routine follow up visit.  Positive risk factors for bleeding include an age of 68 years or older and presence of serious comorbidities.  The bleeding index is 'intermediate risk'.  Positive CHADS2 values include History of HTN and History of Diabetes.  Negative CHADS2 values include Age > 73 years old.  Anticoagulation responsible provider: Olevia Perches MD, Darnell Level.  INR POC: 2.9.  Cuvette Lot#: [id number].  Exp: 07/2010.    Anticoagulation Management Assessment/Plan:      The patient's current anticoagulation dose is Warfarin sodium 5 mg tabs: use asd.  The target INR is 2.0-3.0.  The next INR is due 06/18/2009.  Anticoagulation instructions were given to patient.  Results were reviewed/authorized by Merdis Delay, RN.  She was notified by Merdis Delay, RN, BSN.         Prior Anticoagulation Instructions: INR 2.6  Continue 0.5 tab daily except 1 tab Monday and Friday  Current Anticoagulation Instructions: The patient is to continue with the same dose of coumadin.  This dosage includes: half a tablet (2.5mg ) every day except 1 tablet on Mondays and Fridays.  Appended Document: [email or web address] Pt will come in on Monday, 06/21/09 at noon.

## 2010-04-21 NOTE — Medication Information (Signed)
Summary: Andrea Simmons  Anticoagulant Therapy  Managed by: Porfirio Oar, PharmD Referring MD: Jenell Milliner, MD PCP: Rowe Clack MD Supervising MD: Ron Parker MD, Dellis Filbert Indication 1: Pulmonary Embolism Lab Used: [facility] [place] Site: [place] INR POC 2.5 INR RANGE 2.0-3.0  Dietary changes: no    Health status changes: no    Bleeding/hemorrhagic complications: no    Recent/future hospitalizations: no    Any changes in medication regimen? no    Recent/future dental: no  Any missed doses?: no       Is patient compliant with meds? yes       Allergies: 1)  ! Morphine 2)  ! Demerol 3)  ! * Anaprex  Anticoagulation Management History:      The patient is taking warfarin and comes in today for a routine follow up visit.  Positive risk factors for bleeding include an age of 68 years or older and presence of serious comorbidities.  The bleeding index is 'intermediate risk'.  Positive CHADS2 values include History of HTN and History of Diabetes.  Negative CHADS2 values include Age > 69 years old.  Her last INR was 2.3.  Anticoagulation responsible provider: Ron Parker MD, Dellis Filbert.  INR POC: 2.5.  Cuvette Lot#: [id number].  Exp: 12/2010.    Anticoagulation Management Assessment/Plan:      The patient's current anticoagulation dose is Warfarin sodium 5 mg tabs: use asd.  The target INR is 2.0-3.0.  The next INR is due 12/15/2009.  Anticoagulation instructions were given to patient.  Results were reviewed/authorized by Porfirio Oar, PharmD.  She was notified by Aubery Lapping, PharmD Candidate.         Prior Anticoagulation Instructions: INR- 1.8  Take 1.5 tablets (7.5mg ) tonight.  Then resume schedule of taking 1/2 tablet (2.5mg ) every day except take 1 tablet (5mg ) on Mon and Fri.   Current Anticoagulation Instructions: INR 2.5  Continue 1/2 tablet daily except 1 tablet Mon and Fri.  Return to clinic in 4 weeks.

## 2010-04-21 NOTE — Medication Information (Signed)
Summary: rov/tp   Anticoagulant Therapy  Managed by: Vanessa [place], PharmD Referring MD: Jenell Milliner, MD PCP: Rowe Clack MD Supervising MD: Lia Foyer MD, Marcello Moores Indication 1: Pulmonary Embolism Lab Used: [facility] Site: [place] INR POC 2.3 INR RANGE 2.0-3.0  Dietary changes: no    Health status changes: no    Bleeding/hemorrhagic complications: no    Recent/future hospitalizations: no    Any changes in medication regimen? no    Recent/future dental: no  Any missed doses?: no       Is patient compliant with meds? yes       Allergies: 1)  ! Morphine 2)  ! Demerol 3)  ! * Anaprex  Anticoagulation Management History:      Positive risk factors for bleeding include an age of 4 years or older and presence of serious comorbidities.  The bleeding index is 'intermediate risk'.  Positive CHADS2 values include History of HTN and History of Diabetes.  Negative CHADS2 values include Age > 99 years old.  Her last INR was 2.7.  Anticoagulation responsible provider: Lia Foyer MD, Marcello Moores.  INR POC: 2.3.  Cuvette Lot#: [id number].  Exp: 03/2011.    Anticoagulation Management Assessment/Plan:      The patient's current anticoagulation dose is Warfarin sodium 5 mg tabs: use asd.  The target INR is 2.0-3.0.  The next INR is due 05/04/2010.  Anticoagulation instructions were given to patient.  Results were reviewed/authorized by Vanessa [place], PharmD.         Prior Anticoagulation Instructions: INR 2.7 (goal 2-3)  Continue taking 1/2 tablet everyday except take 1 tablet on Mondays, Wednesdays, and Fridays.  Return to clinic in 4 weeks on Tuesday, January 17th at 2:00PM.  Current Anticoagulation Instructions: INR:  2.3 (goal 2-3)  Your INR is at goal today.  Continue taking 1 tablet Monday, Wednesday, Friday and 1/2 a tablet on other days of the week.   Return to clinic in 4 weeks for another INR check.

## 2010-04-21 NOTE — Assessment & Plan Note (Signed)
Summary: NEW/ TRANSFER FROM DR JOHN/ NWS  #   Vital Signs:  Patient profile:   70 year old female Height:      65 inches (165.10 cm) Weight:      165.4 pounds (75.18 kg) O2 Sat:      96 % on Room air Temp:     97.2 degrees F (36.22 degrees C) oral Pulse rate:   74 / minute BP sitting:   128 / 72  (right arm) Cuff size:   regular  Vitals Entered By: Tomma Lightning (May 24, 2009 2:14 PM)  O2 Flow:  Room air CC: New patient/ transferring from dr. Jenny Reichmann. Pt req refill to be sent to [facility] Is Patient Diabetic? Yes Did you bring your meter with you today? No Pain Assessment Patient in pain? no        Primary Care Provider:  Rowe Clack MD  CC:  New patient/ transferring from dr. Jenny Reichmann. Pt req refill to be sent to [facility].  History of Present Illness: new pt to me but known to our practice and division - transfer PC care from my colleuge dr. 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  - chronic anticoag ongoing for same -no dyspnea or CP -?if needs repeat CT scan to re-eval pulm nodules seen on 2009 CT -- 08/2008 CT radiology report is stable  3) OA/fibro - follows with rheum for same - pain generally well controlled on current meds - most helped by steroid injection in L hip - hopes to put off THR for as long as possible   Preventive Screening-Counseling & Management  Alcohol-Tobacco     Alcohol drinks/day: 0     Smoking Status: never  Caffeine-Diet-Exercise     Does Patient Exercise: yes  Clinical Review Panels:  Prevention   Last Mammogram:  normal (07/20/2008)   Last Pap Smear:  normal (07/20/2008)   Last Colonoscopy:  Location:  [place].  Results: Normal. (03/08/2007)  Immunizations   Last Tetanus Booster:  Td (08/27/2008)   Last Flu Vaccine:  Historical (12/18/2008)   Last Pneumovax:  given  (12/19/2007)  Lipid Management   Cholesterol:  173 (08/27/2008)   HDL (good cholesterol):  32.90 (08/27/2008)  Diabetes Management   HgBA1C:  6.5 (08/27/2008)   Creatinine:  1.0 (08/27/2008)   Last Foot Exam:  yes (01/14/2009)   Last Flu Vaccine:  Historical (12/18/2008)   Last Pneumovax:  given (12/19/2007)  CBC   WBC:  4.7 (08/27/2008)   RBC:  3.90 (08/27/2008)   Hgb:  12.6 (08/27/2008)   Hct:  36.1 (08/27/2008)   Platelets:  177.0 (08/27/2008)   MCV  92.6 (08/27/2008)   MCHC  34.8 (08/27/2008)   RDW  11.9 (08/27/2008)   PMN:  51.0 (08/27/2008)   Lymphs:  38.5 (08/27/2008)   Monos:  8.5 (08/27/2008)   Eosinophils:  1.3 (08/27/2008)   Basophil:  0.7 (08/27/2008)  Complete Metabolic Panel   Glucose:  122 (08/27/2008)   Sodium:  146 (08/27/2008)   Potassium:  4.3 (08/27/2008)   Chloride:  111 (08/27/2008)   CO2:  31 (08/27/2008)   BUN:  23 (08/27/2008)   Creatinine:  1.0 (08/27/2008)   Albumin:  4.2 (08/27/2008)   Total Protein:  7.1 (08/27/2008)   Calcium:  9.3 (08/27/2008)   Total Bili:  0.8 (08/27/2008)   Alk Phos:  36 (08/27/2008)   SGPT (ALT):  30 (08/27/2008)   SGOT (AST):  36 (08/27/2008)   Current Medications (verified): 1)  Diovan Hct 320-25 Mg Tabs (Valsartan-Hydrochlorothiazide) .Marland Kitchen.. 1 Tab Once Daily 2)  Hydrocodone-Acetaminophen 10-650 Mg Tabs (Hydrocodone-Acetaminophen) .Marland Kitchen.. 1 By Mouth Four Times Daily 3)  Celebrex 200 Mg Caps (Celecoxib) .Marland Kitchen.. 1 By Mouth Once Daily 4)  Lorazepam 0.5 Mg Tabs (Lorazepam) .Marland Kitchen.. 1 By Mouth At Bedtime 5)  Gabapentin 300 Mg Caps (Gabapentin) .Marland Kitchen.. 1 By Mouth Once Daily As Needed For Pain 6)  Cymbalta 30 Mg Cpep (Duloxetine Hcl) .... 2 Cap in The Am 1 By Mouth Pm 7)  Omeprazole 20 Mg Cpdr (Omeprazole) .Marland Kitchen.. 1 By Mouth Once Daily 8)  Trilipix 135 Mg Cpdr (Choline Fenofibrate) .Marland Kitchen.. 1 By Mouth Once Daily 9)  Warfarin Sodium 5 Mg Tabs (Warfarin Sodium) .... Use Asd 10)  Simvastatin 80 Mg Tabs (Simvastatin) .... Take One Tablet By  Mouth Daily At Bedtime 11)  Levothyroxine Sodium 25 Mcg Tabs (Levothyroxine Sodium) .Marland Kitchen.. 1 By Mouth Once Daily 12)  Fish Oil 500 Mg Caps (Omega-3 Fatty Acids) .Marland Kitchen.. 1 By Mouth Three Times A Day 13)  Glucophage 500 Mg Tabs (Metformin Hcl) .... 2 By Mouth Two Times A Day  Allergies (verified): 1)  ! Morphine 2)  ! Demerol 3)  ! 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 Appendectomy T A H and B S O Tubal ligation left breast surgery/biopsy 1993, reconstruction spinal fusion x 2 cataract surgery Hysterectomy Cataract extraction Rotator cuff  Family History: Reviewed history from 10/02/2008 and no changes required. parent with arthritis, depression grandparent with arthritis, stroke, HTN other with arthriits, breast cancer, elevated cholesterol GM -strokes when elderly  Social History: work - school bus driver, Pharmacist, hospital asst, Emergency planning/management officer Patient never smoked married Alcohol use-no Drug use-no Regular exercise-yes  Review of Systems       see HPI above. I have reviewed all other systems and they were negative.   Physical Exam  General:  alert, well-developed, well-nourished, well-hydrated, appropriate dress, normal appearance, healthy-appearing, cooperative to examination, and good hygiene.   Eyes:  vision grossly intact; pupils equal, round and reactive to  light.  conjunctiva and lids normal.    Lungs:  normal respiratory effort, no intercostal retractions or use of accessory muscles; normal breath sounds bilaterally - no crackles and no wheezes.    Heart:  normal rate, regular rhythm, no murmur, and no rub. BLE without edema. Msk:  No deformity or scoliosis noted of thoracic or lumbar spine.   Neurologic:  alert & oriented X3 and cranial nerves II-XII symetrically intact.  strength normal in all extremities, sensation intact to light touch, and gait normal. speech fluent without dysarthria or aphasia; follows commands with good comprehension.  Psych:  Oriented X3, memory intact for recent and remote, normally interactive, good eye contact, not anxious appearing, not depressed appearing, and not agitated.      Impression & Recommendations:  Problem #  1:  PULMONARY NODULE (ICD-518.89) per pulm OV 12/10/08: This will be followed with occasional chest xray. Discussed with her.  therefore wil not plan CT cehst at this time-  radiology report from 08/2008 reviewed - stable w/o rec for f/u  Problem # 2:  FIBROMYALGIA (ICD-729.1) cont same - per rheum Her updated medication list for this problem includes:    Hydrocodone-acetaminophen 10-650 Mg Tabs (Hydrocodone-acetaminophen) .Marland Kitchen... 1 by mouth four times daily    Celebrex 200 Mg Caps (Celecoxib) .Marland Kitchen... 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 Use asd  Problem # 4:  HYPOTHYROIDISM (ICD-244.9) followed by endo Her updated medication list for this problem includes:    Levothyroxine Sodium 25 Mcg Tabs (Levothyroxine sodium) .Marland Kitchen... 1 by mouth once daily  Labs Reviewed: TSH: 0.73 (08/27/2008)    HgBA1c: 6.5 (08/27/2008) Chol: 173 (08/27/2008)   HDL: 32.90 (08/27/2008)   TG: 306.0 (08/27/2008)  Problem # 5:  HYPERLIPIDEMIA  (ICD-272.4)  Her updated medication list for this problem includes:    Trilipix 135 Mg Cpdr (Choline fenofibrate) .Marland Kitchen... 1 by mouth once daily    Simvastatin 80 Mg Tabs (Simvastatin) .Marland Kitchen... Take one tablet by mouth daily at bedtime  Labs Reviewed: SGOT: 36 (08/27/2008)   SGPT: 30 (08/27/2008)   HDL:32.90 (08/27/2008)  Chol:173 (08/27/2008)  Trig:306.0 (08/27/2008)  Problem # 6:  HYPERTENSION (ICD-401.9)  Her updated medication list for this problem includes:    Diovan Hct 320-25 Mg Tabs (Valsartan-hydrochlorothiazide) .Marland Kitchen... 1 tab once daily  BP today: 128/72 Prior BP: 120/70 (01/14/2009)  Labs Reviewed: K+: 4.3 (08/27/2008) Creat: : 1.0 (08/27/2008)   Chol: 173 (08/27/2008)   HDL: 32.90 (08/27/2008)   TG: 306.0 (08/27/2008)  Complete Medication List: 1)  Diovan Hct 320-25 Mg Tabs (Valsartan-hydrochlorothiazide) .Marland Kitchen.. 1 tab once daily 2)  Hydrocodone-acetaminophen 10-650 Mg Tabs (Hydrocodone-acetaminophen) .Marland Kitchen.. 1 by mouth four times daily 3)  Celebrex 200 Mg Caps (Celecoxib) .Marland Kitchen.. 1 by mouth once daily 4)  Lorazepam 0.5 Mg Tabs (Lorazepam) .Marland Kitchen.. 1 by mouth at bedtime 5)  Gabapentin 300 Mg Caps (Gabapentin) .Marland Kitchen.. 1 by mouth once daily as needed for pain 6)  Cymbalta 30 Mg Cpep (Duloxetine hcl) .... 2 cap in the am 1 by mouth pm 7)  Omeprazole 20 Mg Cpdr (Omeprazole) .Marland Kitchen.. 1 by mouth once daily 8)  Trilipix 135 Mg Cpdr (Choline fenofibrate) .Marland Kitchen.. 1 by mouth once daily 9)  Warfarin Sodium 5 Mg Tabs (Warfarin sodium) .... Use asd 10)  Simvastatin 80 Mg Tabs (Simvastatin) .... Take one tablet by mouth daily at bedtime 11)  Levothyroxine Sodium 25 Mcg Tabs (Levothyroxine sodium) .Marland Kitchen.. 1 by mouth once daily 12)  Fish Oil 500 Mg Caps (Omega-3 fatty acids) .Marland Kitchen.. 1 by mouth three times a day 13)  Glucophage 500 Mg Tabs (Metformin hcl) .... 2 by mouth two times a day  Patient Instructions: 1)  it was good to see you today.  2)  will send refills to [facility] as discussed - 3)  since all labs are up to  date, we will forego any lab work here today - thanks for asking your other specialists to share their lab results with [place] - 4)  will check with dr. young re: the need to repeat CT chest or not - if a repeat scan is needed, our office will contact you  regarding this appointment once made.  5)  Please schedule a follow-up appointment in 6 months, sooner if problems.  Prescriptions: LEVOTHYROXINE SODIUM 25 MCG TABS (LEVOTHYROXINE SODIUM) 1 by mouth once daily  #90 x 3   Entered by:   Tomma Lightning   Authorized by:   Rowe Clack MD   Signed by:   Tomma Lightning on 05/24/2009   Method used:   Faxed to ...       [facility]* (mail-order)             ,          Ph: [phone]       Fax: [phone]   [id number] SIMVASTATIN 80 MG TABS (SIMVASTATIN) Take one tablet by mouth daily at bedtime  #90 x 3   Entered by:   Tomma Lightning   Authorized by:   Rowe Clack MD   Signed by:   Tomma Lightning on 05/24/2009   Method used:   Faxed to ...       [facility]* (mail-order)             ,          Ph: [phone]       Fax: [phone]   [id number] OMEPRAZOLE 20 MG CPDR (OMEPRAZOLE) 1 by mouth once daily  #90 x 3   Entered by:   Tomma Lightning   Authorized by:   Rowe Clack MD   Signed by:   Tomma Lightning on 05/24/2009   Method used:   Faxed to ...       [facility]* (mail-order)             ,          Ph: [phone]       Fax: [phone]   [id number] GABAPENTIN 300 MG CAPS (GABAPENTIN) 1 by mouth once daily as needed for pain  #90 x 1   Entered by:   Tomma Lightning   Authorized by:   Rowe Clack MD   Signed by:   Tomma Lightning on 05/24/2009   Method used:   Faxed to ...       [facility]* (mail-order)             ,          Ph: [phone]       Fax: [phone]   [id number] CELEBREX 200 MG CAPS (CELECOXIB) 1 by mouth once daily  #90 x 3   Entered by:   Tomma Lightning   Authorized by:   Rowe Clack MD    Signed by:   Tomma Lightning on 05/24/2009   Method used:   Faxed to ...       [facility]* (mail-order)             ,          Ph: [phone]       Fax: [phone]   RxID:   [id number] DIOVAN HCT 320-25 MG TABS (VALSARTAN-HYDROCHLOROTHIAZIDE) 1 tab once daily  #90 x 3   Entered by:   Tomma Lightning   Authorized by:   Rowe Clack MD   Signed by:   Tomma Lightning on 05/24/2009   Method used:   Faxed to ...       [facility]* Probation officer)             ,  Ph: [phone]       Fax: [phone]   RxID:   [id number]    Immunization History:  Influenza Immunization History:    Influenza:  historical (12/18/2008)    Colonoscopy  Procedure date:  03/08/2007  Findings:      Location:  [place].  Results: Normal.

## 2010-04-21 NOTE — Medication Information (Signed)
Summary: Approved/[facility]  Approved/[facility]   Imported By: Bubba Hales 02/22/2010 12:05:05  _____________________________________________________________________  External Attachment:    Type:   Image     Comment:   External Document

## 2010-04-21 NOTE — Assessment & Plan Note (Signed)
Summary: CAD/ANAS    Visit Type:  2 yr f/u Primary Provider:  Rowe Clack MD   History of Present Illness: Andrea Simmons returns today for a 2 year followup concerning cardiovascular risk factors.  Since I saw her 2 years ago, she's had increased dyspnea on exertion with retained house and yard activities. She denies any angina per se.  Her risk factors include age, sex, severe mixed hyperlipidemia which is not at goal despite dual agent therapy, type 2 diabetes, hypertension, and obesity.  She denies he palpitations or syncope. She's had orthopnea, PND or significant edema. In fact her edema and blood pressure been better since we made adjustments in her medications 2 years ago.  Current Medications (verified): 1)  Diovan Hct 320-25 Mg Tabs (Valsartan-Hydrochlorothiazide) .Marland Kitchen.. 1 Tab Once Daily 2)  Hydrocodone-Acetaminophen 10-650 Mg Tabs (Hydrocodone-Acetaminophen) .Marland Kitchen.. 1 By Mouth Four Times Daily 3)  Celebrex 200 Mg Caps (Celecoxib) .Marland Kitchen.. 1 By Mouth Once Daily 4)  Lorazepam 0.5 Mg Tabs (Lorazepam) .Marland Kitchen.. 1 By Mouth At Bedtime 5)  Gabapentin 300 Mg Caps (Gabapentin) .Marland Kitchen.. 1 By Mouth Once Daily As Needed For Pain 6)  Cymbalta 30 Mg Cpep (Duloxetine Hcl) .... 2 Cap in The Am 1 By Mouth Pm 7)  Omeprazole 20 Mg Cpdr (Omeprazole) .Marland Kitchen.. 1 By Mouth Once Daily 8)  Trilipix 135 Mg Cpdr (Choline Fenofibrate) .Marland Kitchen.. 1 By Mouth Once Daily 9)  Warfarin Sodium 5 Mg Tabs (Warfarin Sodium) .... Use Asd 10)  Simvastatin 80 Mg Tabs (Simvastatin) .... Take One Tablet By Mouth Daily At Bedtime 11)  Levothyroxine Sodium 25 Mcg Tabs (Levothyroxine Sodium) .Marland Kitchen.. 1 By Mouth Once Daily 12)  Fish Oil 500 Mg Caps (Omega-3 Fatty Acids) .Marland Kitchen.. 1 By Mouth Three Times A Day 13)  Glucophage 500 Mg Tabs (Metformin Hcl) .... 2 By Mouth Two Times A Day  Allergies: 1)  ! Morphine 2)  ! Demerol 3)  ! 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, BREAST (ICD-174.9) ANXIETY (ICD-300.00) DEPRESSION (ICD-311) GLUCOSE INTOLERANCE (ICD-271.3) VIRAL INFECTION (ICD-079.99) ALLERGIC RHINITIS (ICD-477.9)   MD rooster cards -Hilton Saephan and [facility] cc GI -perry rheum -anderson pulm -young endo -balan  Past Surgical History: Last updated: 10/02/2008 Appendectomy T A H and B S O Tubal ligation left breast surgery/biopsy 1993, reconstruction spinal fusion x 2 cataract surgery Hysterectomy Cataract extraction Rotator cuff  Family History: Last updated: 10/02/2008 parent with arthritis, depression grandparent with arthritis, stroke, HTN other with arthriits, breast cancer, elevated cholesterol GM -strokes when elderly  Social History: Last updated: 05/24/2009 work - school bus driver, Pharmacist, hospital asst, hair dresser Patient never smoked married Alcohol use-no Drug use-no Regular exercise-yes  Risk Factors: Alcohol Use: 0 (05/24/2009) Exercise: yes (05/24/2009)  Risk Factors: Smoking Status: never (05/24/2009)  Review of Systems       negative other than history of present illness  Vital Signs:  Patient profile:   70 year old female Height:      65 inches Weight:      164 pounds BMI:     27.39 Pulse rate:   62 / minute Pulse rhythm:   irregular BP sitting:   116 / 60  (right arm) Cuff size:   large  Vitals Entered By:  Julaine Hua, [place] (Jul 20, 2009 1:34 PM)  Physical Exam  General:  obese.   Head:  normocephalic and atraumatic Eyes:  PERRLA/EOM intact; conjunctiva and lids normal. Neck:   Neck supple, no JVD. No masses, thyromegaly or abnormal cervical nodes. Chest Kaityln Kallstrom:  no deformities or breast masses noted Lungs:  Clear bilaterally to auscultation and percussion. Heart:  Non-displaced PMI, chest non-tender; regular rate and rhythm, S1, S2 without murmurs, rubs or gallops. Carotid upstroke normal, no bruit. Normal abdominal aortic size, no bruits. Femorals normal pulses, no bruits. Pedals normal pulses. No edema, no varicosities. Abdomen:  Bowel sounds positive; abdomen soft and non-tender without masses, organomegaly, or hernias noted. No hepatosplenomegaly. Msk:  Back normal, normal gait. Muscle strength and tone normal. Pulses:  pulses normal in all 4 extremities Extremities:  No clubbing or cyanosis. Neurologic:  Alert and oriented x 3. Skin:  Intact without lesions or rashes. Psych:  Normal affect.   EKG  Procedure date:  07/20/2009  Findings:      normal sinus rhythm, poor R wave progression in the anterior precordium., left axis deviation which is new  Impression & Recommendations:  Problem # 1:  DYSPNEA (ICD-786.05) Assessment Deteriorated  I M. very concerned that her increased shortness of breath with exertion or dyspnea on exertion is an anginal equivalent. Her EKG is changed somewhat since I saw her 2 years ago. She has multiple cardiac risk factors and despite aggressive lipid-lowering she is not at goal. I would have a low threshold to catheter Andrea. Bebeau if she has any question of ischemia. I will schedule exercise stress Myoview. I have also suggested an 81 mg enteric-coated aspirin. Her updated medication list for this problem includes:    Diovan Hct 320-25 Mg Tabs (Valsartan-hydrochlorothiazide) .Marland Kitchen... 1 tab once daily  Orders: EKG w/ Interpretation (93000) Nuclear Stress Test (Nuc Stress Test)  Problem # 2:  HYPERTENSION (ICD-401.9) Assessment: Improved  Her updated medication list for this problem includes:    Diovan Hct 320-25 Mg Tabs  (Valsartan-hydrochlorothiazide) .Marland Kitchen... 1 tab once daily  Problem # 3:  DIABETES MELLITUS, TYPE II (ICD-250.00)  Her updated medication list for this problem includes:    Diovan Hct 320-25 Mg Tabs (Valsartan-hydrochlorothiazide) .Marland Kitchen... 1 tab once daily    Glucophage 500 Mg Tabs (Metformin hcl) .Marland Kitchen... 2 by mouth two times a day  Problem # 4:  PULMONARY EMBOLISM (ICD-415.19) Assessment: Unchanged  Her updated medication list for this problem includes:    Warfarin Sodium 5 Mg Tabs (Warfarin sodium) ..... Use asd  Problem # 5:  HYPERLIPIDEMIA (P102836.4)  Her updated medication list for this problem includes:    Trilipix 135 Mg Cpdr (Choline fenofibrate) .Marland Kitchen... 1 by mouth once daily    Simvastatin 80 Mg Tabs (Simvastatin) .Marland Kitchen... Take one tablet by mouth daily at bedtime  Patient Instructions: 1)  Your physician recommends that you schedule a follow-up appointment in: 2 years with dr Little [place] 2)  Your physician recommends that you continue on your current medications as directed. Please refer to the Current Medication list given to you today. 3)  Your physician discussed the risks, benefits and indications for preventive aspirin therapy. It is recommended that you start (or continue) taking 81 mg of aspirin a day. 4)  Your physician has requested that you have an exercise stress myoview.  For further information please visit [email or web address].  Please follow instruction sheet, as given.

## 2010-04-29 ENCOUNTER — Encounter: Payer: Self-pay | Admitting: Internal Medicine

## 2010-04-29 ENCOUNTER — Ambulatory Visit (INDEPENDENT_AMBULATORY_CARE_PROVIDER_SITE_OTHER): Payer: Medicare Other | Admitting: Internal Medicine

## 2010-04-29 ENCOUNTER — Other Ambulatory Visit: Payer: Self-pay | Admitting: Internal Medicine

## 2010-04-29 ENCOUNTER — Other Ambulatory Visit: Payer: Medicare Other

## 2010-04-29 DIAGNOSIS — E785 Hyperlipidemia, unspecified: Secondary | ICD-10-CM

## 2010-04-29 DIAGNOSIS — E119 Type 2 diabetes mellitus without complications: Secondary | ICD-10-CM

## 2010-04-29 DIAGNOSIS — I10 Essential (primary) hypertension: Secondary | ICD-10-CM

## 2010-04-29 DIAGNOSIS — E039 Hypothyroidism, unspecified: Secondary | ICD-10-CM

## 2010-04-29 LAB — LDL CHOLESTEROL, DIRECT: Direct LDL: 101.6 mg/dL

## 2010-04-29 LAB — LIPID PANEL
Cholesterol: 169 mg/dL (ref 0–200)
HDL: 37.6 mg/dL — ABNORMAL LOW (ref >39.00)
Triglycerides: 215 mg/dL — ABNORMAL HIGH (ref 0.0–149.0)

## 2010-05-02 DIAGNOSIS — I2699 Other pulmonary embolism without acute cor pulmonale: Secondary | ICD-10-CM

## 2010-05-04 ENCOUNTER — Encounter (INDEPENDENT_AMBULATORY_CARE_PROVIDER_SITE_OTHER): Payer: Medicare Other

## 2010-05-04 ENCOUNTER — Encounter: Payer: Self-pay | Admitting: Internal Medicine

## 2010-05-04 DIAGNOSIS — Z7901 Long term (current) use of anticoagulants: Secondary | ICD-10-CM

## 2010-05-04 DIAGNOSIS — I8010 Phlebitis and thrombophlebitis of unspecified femoral vein: Secondary | ICD-10-CM

## 2010-05-05 NOTE — Assessment & Plan Note (Signed)
Summary: 6 MTH NWS #   Vital Signs:  Patient profile:   70 year old female Height:      65 inches (165.10 cm) Weight:      166.8 pounds (75.82 kg) BMI:     27.86 O2 Sat:      95 % on Room air Temp:     98.5 degrees F (36.94 degrees C) oral Pulse rate:   69 / minute BP sitting:   132 / 70  (right arm) Cuff size:   regular  Vitals Entered By: Tomma Lightning RMA (April 29, 2010 1:20 PM)  O2 Flow:  Room air CC: 6 month follow-up Is Patient Diabetic? Yes Did you bring your meter with you today? No Pain Assessment Patient in pain? no        Primary Care Provider:  Rowe Clack MD  CC:  6 month follow-up.  History of Present Illness: here for f/u:  1) hypothyroid - follows with endo for same - no skin, hair or weight changes - needs refills on meds soon - recent labs ok per pt at endo office  2) hx recurrent PE - 2008, 1/12009  - chronic anticoag ongoing for same -no dyspnea or CP -?if needs repeat CT scan to re-eval pulm nodules seen on 2009 CT -- 08/2008 CT radiology report is stable  3) OA/fibro - follows with rheum for same - pain generally well controlled on current meds - most helped by steroid injection in L hip - hopes to put off THR for as long as possible  4) dyslipidemia- recent changes in statin med and dosing 09/2009 - change from simva 80 to lipitor 40, now crestor 20 qod - RUQ symptoms predated med changes - no muscle wekaness or other probs  5) HTN - reports compliance with ongoing medical treatment and no changes in medication dose or frequency. denies adverse side effects related to current therapy.   hx Abdominal Pain 10/2009 - mostly improved, rare epidsodes now - RUQ and RLQ location - reviewed abd u/s 10/2009: fatty liver, no GB stones - pt feels symptoms related to constipation  Clinical Review Panels:  Prevention   Last Mammogram:  ASSESSMENT: Negative - BI-RADS 1^MM DIGITAL SCREENING (11/17/2009)   Last Pap Smear:  normal (07/20/2008)   Last  Colonoscopy:  Location:  [place].  Results: Normal. (03/08/2007)  Immunizations   Last Tetanus Booster:  Td (08/27/2008)   Last Flu Vaccine:  Fluvax 3+ (01/20/2010)   Last Pneumovax:  given (12/19/2007)  Lipid Management   Cholesterol:  209 (04/19/2009)   LDL (bad choesterol):  125 (04/19/2009)   HDL (good cholesterol):  33.0 (04/19/2009)   Triglycerides:  256 (04/19/2009)  Diabetes Management   HgBA1C:  6.5 (08/27/2008)   Creatinine:  1.0 (10/25/2009)   Last Foot Exam:  yes (01/14/2009)   Last Flu Vaccine:  Fluvax 3+ (01/20/2010)   Last Pneumovax:  given (12/19/2007)  CBC   WBC:  6.1 (10/25/2009)   RBC:  3.77 (10/25/2009)   Hgb:  12.3 (10/25/2009)   Hct:  34.9 (10/25/2009)   Platelets:  213.0 (10/25/2009)   MCV  92.7 (10/25/2009)   MCHC  35.3 (10/25/2009)   RDW  12.5 (10/25/2009)   PMN:  55.5 (10/25/2009)   Lymphs:  35.2 (10/25/2009)   Monos:  7.8 (10/25/2009)   Eosinophils:  1.2 (10/25/2009)   Basophil:  0.3 (10/25/2009)  Complete Metabolic Panel   Glucose:  143 (10/25/2009)   Sodium:  142 (10/25/2009)   Potassium:  4.8 (01/20/2010)   Chloride:  103 (10/25/2009)   CO2:  33 (10/25/2009)   BUN:  30 (10/25/2009)   Creatinine:  1.0 (10/25/2009)   Albumin:  4.4 (10/25/2009)   Total Protein:  7.3 (10/25/2009)   Calcium:  9.5 (10/25/2009)   Total Bili:  0.5 (10/25/2009)   Alk Phos:  34 (10/25/2009)   SGPT (ALT):  20 (10/25/2009)   SGOT (AST):  27 (10/25/2009)   Current Medications (verified): 1)  Diovan Hct 320-25 Mg Tabs (Valsartan-Hydrochlorothiazide) .Marland Kitchen.. 1 Tab Once Daily 2)  Hydrocodone-Acetaminophen 10-650 Mg Tabs (Hydrocodone-Acetaminophen) .Marland Kitchen.. 1 By Mouth Four Times Daily 3)  Celebrex 200 Mg Caps (Celecoxib) .Marland Kitchen.. 1 By Mouth Once Daily 4)  Lorazepam 0.5 Mg Tabs (Lorazepam) .Marland Kitchen.. 1 By Mouth At Bedtime 5)  Gabapentin 300 Mg Caps (Gabapentin) .Marland Kitchen.. 1 By Mouth Once Daily As Needed For Pain 6)  Cymbalta 30 Mg Cpep (Duloxetine Hcl) .... 2 Cap in  The Am 1 By Mouth Pm 7)  Omeprazole 20 Mg Cpdr (Omeprazole) .Marland Kitchen.. 1 By Mouth Once Daily 8)  Trilipix 135 Mg Cpdr (Choline Fenofibrate) .Marland Kitchen.. 1 By Mouth Once Daily 9)  Warfarin Sodium 5 Mg Tabs (Warfarin Sodium) .... Use Asd 10)  Levothyroxine Sodium 25 Mcg Tabs (Levothyroxine Sodium) .Marland Kitchen.. 1 By Mouth Once Daily 11)  Fish Oil 500 Mg Caps (Omega-3 Fatty Acids) .Marland Kitchen.. 1 By Mouth Three Times A Day 12)  Glucophage 500 Mg Tabs (Metformin Hcl) .... 2 By Mouth Two Times A Day 13)  Aspir-Low 81 Mg Tbec (Aspirin) .... Take One Tablet Daily 14)  Crestor 20 Mg Tabs (Rosuvastatin Calcium) .... Take 1 By [facility] Other Day 15)  Multivitamins  Tabs (Multiple Vitamin) .... Take 1 By Mouth Once Daily 16)  Calcium 600 Mg Tabs (Calcium) .... Take 2 By Mouth Once Daily  Allergies (verified): 1)  ! Morphine 2)  ! Demerol 3)  ! 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    Physical Exam  General:  alert, well-developed, well-nourished, and cooperative to examination.    Lungs:  normal respiratory effort, no intercostal retractions or use of accessory muscles; normal breath sounds bilaterally - no crackles and no wheezes.   no intercostal retractions.   Heart:  normal rate, regular rhythm, no murmur, and no rub. BLE without edema. Psych:  Oriented X3, memory intact for recent and remote, normally interactive, good eye contact, not anxious appearing, not depressed appearing, and not agitated.      Impression & Recommendations:  Problem # 1:  HYPERLIPIDEMIA (B2193296.4)  Her  updated medication list for this problem includes:    Trilipix 135 Mg Cpdr (Choline fenofibrate) .Marland Kitchen... 1 by mouth once daily    Crestor 20 Mg Tabs (Rosuvastatin calcium) .Marland Kitchen... Take 1 by mouthevery other day  Labs Reviewed: SGOT: 27 (10/25/2009)   SGPT: 20 (10/25/2009)   HDL:33.0 (04/19/2009), 32.90 (08/27/2008)  LDL:125 (04/19/2009)  Chol:209 (04/19/2009), 173 (08/27/2008)  Trig:256 (04/19/2009), 306.0 (08/27/2008)  Orders: TLB-Lipid Panel ([id number])  Problem # 2:  HYPERTENSION (ICD-401.9)  Her updated medication list for this problem includes:    Diovan  Hct 320-25 Mg Tabs (Valsartan-hydrochlorothiazide) .Marland Kitchen... 1 tab once daily  BP today: 132/70 Prior BP: 122/70 (01/20/2010)  Labs Reviewed: K+: 4.8 (01/20/2010) Creat: : 1.0 (10/25/2009)   Chol: 209 (04/19/2009)   HDL: 33.0 (04/19/2009)   LDL: 125 (04/19/2009)   TG: 256 (04/19/2009)  Problem # 3:  DIABETES MELLITUS, TYPE II (ICD-250.00)  Her updated medication list for this problem includes:    Diovan Hct 320-25 Mg Tabs (Valsartan-hydrochlorothiazide) .Marland Kitchen... 1 tab once daily    Glucophage 500 Mg Tabs (Metformin hcl) .Marland Kitchen... 2 by mouth two times a day    Aspir-low 81 Mg Tbec (Aspirin) .Marland Kitchen... Take one tablet daily  Labs Reviewed: Creat: 1.0 (10/25/2009)    Reviewed HgBA1c results: 6.5 (08/27/2008)  Problem # 4:  HYPOTHYROIDISM (ICD-244.9)  Her updated medication list for this problem includes:    Levothyroxine Sodium 25 Mcg Tabs (Levothyroxine sodium) .Marland Kitchen... 1 by mouth once daily  Labs Reviewed: TSH: 0.770 (04/19/2009)    HgBA1c: 6.5 (08/27/2008) Chol: 209 (04/19/2009)   HDL: 33.0 (04/19/2009)   LDL: 125 (04/19/2009)   TG: 256 (04/19/2009)  Complete Medication List: 1)  Diovan Hct 320-25 Mg Tabs (Valsartan-hydrochlorothiazide) .Marland Kitchen.. 1 tab once daily 2)  Hydrocodone-acetaminophen 10-650 Mg Tabs (Hydrocodone-acetaminophen) .Marland Kitchen.. 1 by mouth four times daily 3)  Celebrex 200 Mg Caps (Celecoxib) .Marland Kitchen.. 1 by mouth once daily 4)   Lorazepam 0.5 Mg Tabs (Lorazepam) .Marland Kitchen.. 1 by mouth at bedtime 5)  Gabapentin 300 Mg Caps (Gabapentin) .Marland Kitchen.. 1 by mouth three times a day  as needed for pain 6)  Cymbalta 30 Mg Cpep (Duloxetine hcl) .... 2 cap in the am 1 by mouth pm 7)  Omeprazole 20 Mg Cpdr (Omeprazole) .Marland Kitchen.. 1 by mouth once daily 8)  Trilipix 135 Mg Cpdr (Choline fenofibrate) .Marland Kitchen.. 1 by mouth once daily 9)  Warfarin Sodium 5 Mg Tabs (Warfarin sodium) .... Use asd 10)  Levothyroxine Sodium 25 Mcg Tabs (Levothyroxine sodium) .Marland Kitchen.. 1 by mouth once daily 11)  Fish Oil 500 Mg Caps (Omega-3 fatty acids) .Marland Kitchen.. 1 by mouth three times a day 12)  Glucophage 500 Mg Tabs (Metformin hcl) .... 2 by mouth two times a day 13)  Aspir-low 81 Mg Tbec (Aspirin) .... Take one tablet daily 14)  Crestor 20 Mg Tabs (Rosuvastatin calcium) .... Take 1 by mouthevery other day 15)  Multivitamins Tabs (Multiple vitamin) .... Take 1 by mouth once daily 16)  Calcium 600 Mg Tabs (Calcium) .... Take 2 by mouth once daily  Patient Instructions: 1)  it was good to see you today. 2)  test(s) ordered today - your results will be posted on the phone tree for review in 48-72 hours from the time of test completion; call [phone] and enter your 9 digit MRN (listed above on this page, just below your name); if any changes need to be made or there are abnormal results, you will be contacted directly.  3)  Please schedule a follow-up appointment in 6 months to monitor blood pressure and cholesterol, call sooner if problems.    Orders Added: 1)  Est. Patient Level IV [id number] 2)  TLB-Lipid Panel [[id number]]

## 2010-05-11 NOTE — Medication Information (Signed)
Summary: [facility]  Anticoagulant Therapy  Managed by: Bonnita Nasuti, PharmD, BCPS, CPP Referring MD: Jenell Milliner, MD PCP: Rowe Clack MD Supervising MD: Haroldine Laws MD, Quillian Quince Indication 1: Pulmonary Embolism Lab Used: [facility] Site: [place] INR POC 2.9 INR RANGE 2.0-3.0  Dietary changes: no    Health status changes: no    Bleeding/hemorrhagic complications: no    Recent/future hospitalizations: no    Any changes in medication regimen? no    Recent/future dental: no  Any missed doses?: no       Is patient compliant with meds? yes       Current Medications (verified): 1)  Diovan Hct 320-25 Mg Tabs (Valsartan-Hydrochlorothiazide) .Marland Kitchen.. 1 Tab Once Daily 2)  Hydrocodone-Acetaminophen 10-650 Mg Tabs (Hydrocodone-Acetaminophen) .Marland Kitchen.. 1 By Mouth Four Times Daily 3)  Celebrex 200 Mg Caps (Celecoxib) .Marland Kitchen.. 1 By Mouth Once Daily 4)  Lorazepam 0.5 Mg Tabs (Lorazepam) .Marland Kitchen.. 1 By Mouth At Bedtime 5)  Gabapentin 300 Mg Caps (Gabapentin) .Marland Kitchen.. 1 By Mouth Three Times A Day  As Needed For Pain 6)  Cymbalta 30 Mg Cpep (Duloxetine Hcl) .... 2 Cap in The Am 1 By Mouth Pm 7)  Omeprazole 20 Mg Cpdr (Omeprazole) .Marland Kitchen.. 1 By Mouth Once Daily 8)  Trilipix 135 Mg Cpdr (Choline Fenofibrate) .Marland Kitchen.. 1 By Mouth Once Daily 9)  Warfarin Sodium 5 Mg Tabs (Warfarin Sodium) .... Use Asd 10)  Levothyroxine Sodium 25 Mcg Tabs (Levothyroxine Sodium) .Marland Kitchen.. 1 By Mouth Once Daily 11)  Fish Oil 500 Mg Caps (Omega-3 Fatty Acids) .Marland Kitchen.. 1 By Mouth Three Times A Day 12)  Glucophage 500 Mg Tabs (Metformin Hcl) .... 2 By Mouth Two Times A Day 13)  Aspir-Low 81 Mg Tbec (Aspirin) .... Take One Tablet Daily 14)  Crestor 20 Mg Tabs (Rosuvastatin Calcium) .... Take 1 By [facility] Other Day 15)  Multivitamins  Tabs (Multiple Vitamin) .... Take 1 By Mouth Once Daily 16)  Calcium 600 Mg Tabs (Calcium) .... Take 2 By Mouth Once Daily  Allergies: 1)  ! Morphine 2)  ! Demerol 3)  ! *  Anaprex  Anticoagulation Management History:      The patient is taking warfarin and comes in today for a routine follow up visit.  Positive risk factors for bleeding include an age of 68 years or older and presence of serious comorbidities.  The bleeding index is 'intermediate risk'.  Positive CHADS2 values include History of HTN and History of Diabetes.  Negative CHADS2 values include Age > 70 years old.  Her last INR was 2.7.  Anticoagulation responsible provider: Rever Pichette MD, Quillian Quince.  INR POC: 2.9.  Cuvette Lot#: [id number].  Exp: 03/2011.    Anticoagulation Management Assessment/Plan:      The patient's current anticoagulation dose is Warfarin sodium 5 mg tabs: use asd.  The target INR is 2.0-3.0.  The next INR is due 06/01/2010.  Anticoagulation instructions were given to patient.  Results were reviewed/authorized by Bonnita Nasuti, PharmD, BCPS, CPP.         Prior Anticoagulation Instructions: INR:  2.3 (goal 2-3)  Your INR is at goal today.  Continue taking 1 tablet Monday, Wednesday, Friday and 1/2 a tablet on other days of the week.   Return to clinic in 4 weeks for another INR check.    Current Anticoagulation Instructions: INR 2.9  Coumadin 5mg  tabs  Take 1 tab MON, WED, FRI 1/2 tab all other days

## 2010-05-31 ENCOUNTER — Ambulatory Visit: Payer: Self-pay | Admitting: Internal Medicine

## 2010-06-01 ENCOUNTER — Encounter: Payer: Self-pay | Admitting: Cardiology

## 2010-06-01 ENCOUNTER — Encounter (INDEPENDENT_AMBULATORY_CARE_PROVIDER_SITE_OTHER): Payer: Medicare Other

## 2010-06-01 DIAGNOSIS — Z7901 Long term (current) use of anticoagulants: Secondary | ICD-10-CM

## 2010-06-01 DIAGNOSIS — I2699 Other pulmonary embolism without acute cor pulmonale: Secondary | ICD-10-CM

## 2010-06-07 NOTE — Medication Information (Signed)
Summary: [facility]   Anticoagulant Therapy  Managed by: Danella Penton, RN Referring MD: Jenell Milliner, MD PCP: Rowe Clack MD Supervising MD: Lia Foyer MD, Marcello Moores Indication 1: Pulmonary Embolism Lab Used: [facility] Site: [place] INR POC 2.5 INR RANGE 2.0-3.0  Dietary changes: no    Health status changes: no    Bleeding/hemorrhagic complications: no    Recent/future hospitalizations: no    Any changes in medication regimen? no    Recent/future dental: no  Any missed doses?: no       Is patient compliant with meds? yes       Allergies: 1)  ! Morphine 2)  ! Demerol 3)  ! * Anaprex  Anticoagulation Management History:      The patient is taking warfarin and comes in today for a routine follow up visit.  Positive risk factors for bleeding include an age of 5 years or older and presence of serious comorbidities.  The bleeding index is 'intermediate risk'.  Positive CHADS2 values include History of HTN and History of Diabetes.  Negative CHADS2 values include Age > 58 years old.  Her last INR was 2.7.  Anticoagulation responsible provider: Lia Foyer MD, Marcello Moores.  INR POC: 2.5.  Cuvette Lot#: [id number].  Exp: 03/2011.    Anticoagulation Management Assessment/Plan:      The patient's current anticoagulation dose is Warfarin sodium 5 mg tabs: use asd.  The target INR is 2.0-3.0.  The next INR is due 06/29/2010.  Anticoagulation instructions were given to patient.  Results were reviewed/authorized by Danella Penton, RN.  She was notified by Danella Penton, RN.         Prior Anticoagulation Instructions: INR 2.9  Coumadin 5mg  tabs  Take 1 tab MON, WED, FRI 1/2 tab all other days  Current Anticoagulation Instructions: INR 2.5 Continue taking 1/2 tablet every day, except take 1 tablet on Mondays, Wednesdays, and Fridays. Recheck in 4 weeks.

## 2010-06-29 ENCOUNTER — Encounter: Payer: Medicare Other | Admitting: *Deleted

## 2010-06-30 ENCOUNTER — Ambulatory Visit (INDEPENDENT_AMBULATORY_CARE_PROVIDER_SITE_OTHER): Payer: Medicare Other | Admitting: *Deleted

## 2010-06-30 DIAGNOSIS — Z7901 Long term (current) use of anticoagulants: Secondary | ICD-10-CM | POA: Insufficient documentation

## 2010-06-30 DIAGNOSIS — I2699 Other pulmonary embolism without acute cor pulmonale: Secondary | ICD-10-CM

## 2010-07-27 ENCOUNTER — Ambulatory Visit (INDEPENDENT_AMBULATORY_CARE_PROVIDER_SITE_OTHER): Payer: Medicare Other | Admitting: *Deleted

## 2010-07-27 DIAGNOSIS — I2699 Other pulmonary embolism without acute cor pulmonale: Secondary | ICD-10-CM

## 2010-07-31 ENCOUNTER — Other Ambulatory Visit: Payer: Self-pay | Admitting: Internal Medicine

## 2010-08-02 NOTE — Assessment & Plan Note (Signed)
[facility]                            CARDIOLOGY OFFICE NOTE   NAME:Morace, ENNIFER TELLIER                      MRN:          [id number]  DATE:08/09/2007                            DOB:          1940-08-06    Ms. Brocker returns today to discuss the findings of her stress Myoview.  Also obtained a 2-D echocardiogram that she had during a hospital stay  in January.   Her echocardiogram showed an EF of 65% with no valvular abnormalities.  She had no LVH or any segmental wall motion abnormalities.  Stress  Myoview showed likewise an EF of 67% with no sign of scar or ischemia.  It was an adenosine study.   We talked again about her studies and their implications.  We talked  about the importance of getting her blood sugar under control.  Last  hemoglobin A1c was 8.0%.  She is on a new medication which she did not  bring today at that Dr. Tollie Pizza started.  I have encouraged to do so.   We will plan on seeing her back in a couple years if she would like.  She had an excellent hands with Dr. Elease Hashimoto, and I have no other  recommendations at this time.     Thomas C. Verl Blalock, MD, [facility]  Electronically Signed    TCW/MedQ  DD: 08/09/2007  DT: 08/09/2007  Job #: [id number]   cc:   Carolann Littler, M.D.

## 2010-08-02 NOTE — H&P (Signed)
NAMEDONIE, CALTON NO.:  [id number]   MEDICAL RECORD NO.:  [id number]          PATIENT TYPE:  EMS   LOCATION:  [facility]                         FACILITY:  [facility]   PHYSICIAN:  Girard Cooter, MD         DATE OF BIRTH:  1940-05-14   DATE OF ADMISSION:  03/23/2007  DATE OF DISCHARGE:                              HISTORY & PHYSICAL   PRIMARY CARE PHYSICIAN:  Carolann Littler, M.D.   HISTORY OF PRESENT ILLNESS:  The patient is a 70 year old female who  presents to [facility] with acute onset of shortness of  breath that happened earlier in the day, accompanied by pleuritic type  chest pain.   REVIEW OF SYSTEMS:  No cough, no hemoptysis reported. Also, there was no  dizziness. Otherwise, 12 point review of systems is negative. Of note,  the patient has had a history of pulmonary embolism about 6 months ago,  diagnosed and treated in [place], [place]. The patient is status  post 6 months of Coumadin treatment, which was finalized 3 weeks ago.  She denies being diagnosed with hypercoagulable type syndrome. She is  not on estrogen and she denies a history of recent trauma. She also  denies any recent excessive travel history or being immobilized for a  lengthy period of time.   PAST MEDICAL HISTORY:  1. Pulmonary embolism as above.  2. Hypertension.  3. Diabetes type 2.  4. History of breast cancer.  5. Fibromyalgia.  6. Hypercholesterolemia.  7. Osteoarthritis.   PAST SURGICAL HISTORY:  1. Mastectomy.  2. Cervical disk surgery.  3. Tubal ligation 1979.  4. Spinal fusion 1981 and 1982.  5. Hysterectomy 1985.  6. Oophorectomy 1992.  7. Mastectomy 1993.  8. Status post colonoscopy and endoscopy March 08, 2007 by Dr. Scarlette Shorts.   SOCIAL HISTORY:  She is a non-smoker. Denies drugs or alcohol.   ALLERGIES:  ANAPROX, DEMEROL, MORPHINE. Apparently the allergic reaction  is nausea and vomiting with all those medications.   MEDICATIONS:  1. Lovastatin 40 mg in the a.m.  2. Celebrex 200 mg b.i.d.  3. Cymbalta 30 mg 3 tabs daily, 2 in the a.m. and 1 in the p.m.  4. Diovan/hydrochlorothiazide 160/12.5 mg p.o. daily.  5. Gabapentin 300 mg daily.  6. Lorazepam 0.5 mg 1 tablet at bedtime.  7. Multivitamin.  8. Nexium 40 mg p.o. daily.   PHYSICAL EXAMINATION:  GENERAL:  No acute distress.  VITAL SIGNS:  Blood pressure 177/97, temperature 98.2, pulse 90,  respiratory rate 24. Pulse ox 95% on room air.  HEENT:  Normocephalic and atraumatic. No JVD. No carotid bruits.  NECK:  Supple. PERRLA. Extraocular muscles intact.  CARDIOVASCULAR:  S1 and S2. Regular rate and rhythm. No murmur, rub, or  click.  ABDOMEN:  Soft, nontender, and nondistended. Positive bowel sounds.  LUNGS:  Clear to auscultation bilaterally. No rhonchi, rales, or  wheezes.  EXTREMITIES:  No clubbing, cyanosis, or edema.  NEUROLOGIC:  Alert and oriented times three. Cranial nerves 2-12 are  grossly intact.   LABORATORY DATA:  Cardiac markers enzymes x2 are negative. Sodium 141,  potassium 3.7, chloride 109, glucose 168, BUN 19, creatinine 0.9. White  count 7. Hemoglobin and hematocrit 14.2 and 41.4. Platelets 150,000.   CT angiogram shows acute pulmonary embolus of the segmental right middle  lobe, pulmonary arteries and segmental pulmonary arteries to both lower  lobes. Tiny bilateral pulmonary nodules. Followup CT with contrast 6 to  12 months recommended. Complete visualized low-density lesion in the  spleen measures about 2.4 cm in diameter. Non-emergent abdominal CT or  abdominal ultrasound could be used to further evaluate.   EKG shows normal sinus rhythm, septal infarct of undetermined age. Non-  specific STT wave changes.   ASSESSMENT/PLAN:  A 70 year old female with:  1. Pulmonary embolism status post treatment with Coumadin. Apparently,      the patient stopped the Coumadin 3 weeks ago. I question the need      for either a filter versus a  direct thrombin inhibitor. We may in      this regard, need a hematology/oncology consultation for      recommendations. Regardless, the patient will be started on      anticoagulation with Lovenox 1 mg per kg per day and depending on      recommendations, we may start Coumadin until INR is our goal. The      patient is currently hemodynamically stable, not hypotensive. Will      go ahead and get a 2-D echocardiogram to rule out  right      ventricular strain. Also, will send out hypercoagulable panel, rule      out  factor 5 Leiden deficiency. I am not entirely sure if this was      done in the past.  2. Incidental finding of a splenic lesion. See above. We may consider      doing a CT of the abdomen but I will go ahead and order an      ultrasound of the abdomen right now.  3. Hypothyroidism. Will continue Levothyroxine. Get a TSH.  4. Hypertension. Blood pressures are stable now. Will continue home      medications, Diovan/hydrochlorothiazide.  5. Peripheral neuropathy, continue Gabapentin. Currently, the patient      is hemodynamically stable.  6. Ethics:  The patient is a FULL CODE.      Girard Cooter, MD  Electronically Signed     RR/MEDQ  D:  03/23/2007  T:  03/23/2007  Job:  [id number]   cc:   Carolann Littler, M.D.

## 2010-08-02 NOTE — Assessment & Plan Note (Signed)
[place] OFFICE NOTE   NAME:Andrea Simmons, Andrea Simmons                      MRN:          [id number]  DATE:02/25/2007                            DOB:          04/04/1940    The patient is self-referred.   REASON FOR CONSULTATION:  1. Screening colonoscopy.  2. Dysphagia.   HISTORY:  This is a 70 year old white female with a history of  hypertension, glucose intolerance, hyperlipidemia, hypothyroidism,  anxiety with depression, remote breast cancer, and pulmonary embolus for  which she is on Coumadin. The patient has seen Andrea Simmons in the  remote past. She did undergo colonoscopy and upper endoscopy in April of  2001. Colonoscopy was negative. Followup in five years recommended.  Upper endoscopy revealed a small hiatal hernia as well as gastritis. She  does have chronic reflux for which she is on omeprazole. On medication,  no reflux symptoms. She complains of intermittent solid food dysphagia  to items such as meat. In terms of her lower GI tract, no problems with  bleeding or change in her bowel habits from baseline. She tells me that  she is anticipating coming off of Coumadin per Andrea Simmons.   PAST MEDICAL HISTORY:  As above.   PAST SURGICAL HISTORY:  1. Left breast surgery.  2. Hysterectomy.  3. Tubal ligation.  4. Appendectomy.  5. Spinal fusion x2.  6. Oophorectomy.  7. Cataract surgery.   ALLERGIES:  MORPHINE, DEMEROL, AND ANAPROX-ALL OF WHICH CAUSED SEVERE  NAUSEA.   CURRENT MEDICATIONS:  1. Coumadin as directed.  2. Omeprazole 20 mg daily.  3. Gabapentin 300 mg daily.  4. Levothyroxine 25 mcg daily.  5. Cymbalta 60 mg in the morning and 30 mg at night.  6. Celebrex 200 mg b.i.d.  7. Diovan hydrochlorothiazide 160/12.5 mg daily.  8. Lorazepam 0.5 mg at night.  9. Simvastatin 20 mg daily.  10.Fish oil.  11.Hydrocodone p.r.n.  12.Cyclobenzaprine p.r.n.   FAMILY HISTORY:  Negative  for gastrointestinal malignancy.   SOCIAL HISTORY:  The patient is married with one daughter, lives with  her husband. She is a Theatre manager, retired. She has one grandson. Does  not smoke or use alcohol.   REVIEW OF SYSTEMS:  Per diagnostic evaluation form.   PHYSICAL EXAMINATION:  Well-appearing female in no acute distress. Blood  pressure 144/90, heart rate 60. Weight: 178 pounds. She is 5 feet, 5  inches in height.  HEENT: Sclerae anicteric. Conjunctivae are pink. Oral mucosa intact. No  adenopathy.  LUNGS:  Are clear.  HEART: Is regular.  ABDOMEN: Soft without tenderness with good bowel sounds. No  organomegaly, mass or hernia.  EXTREMITIES: Are without edema.   IMPRESSION:  1. Screening colonoscopy. Last examination in 2001. The patient is an      appropriate candidate without contraindication. The nature of the      procedure as well as the risks, benefits and alternatives have been      reviewed. She understood and agreed to proceed. She requests the      Osmo prep. I stressed the importance of vigorous hydration.  With      regards to her Coumadin, she is going to check with Andrea Simmons      to see if it is okay to discontinue the drug pre-procedure. If      there are any problems she will let us know.  2. Upper endoscopy with esophageal dilation. The nature of the      procedure as well as the risks, benefits and alternatives were      reviewed. She understood and agreed to proceed.  3. Ongoing general medical care with Andrea Simmons.     Andrea Chuck. Henrene Pastor, MD  Electronically Signed    JNP/MedQ  DD: 02/25/2007  DT: 02/25/2007  Job #: [id number]   cc:   Andrea Simmons, M.D.
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
## 2010-08-02 NOTE — Discharge Summary (Signed)
NAMEBRITTAY, Andrea Simmons               ACCOUNT NO.:  [id number]   MEDICAL RECORD NO.:  [id number]          PATIENT TYPE:  INP   LOCATION:  [id number]                         FACILITY:  [facility]   PHYSICIAN:  Cyril Mourning, D.O.    DATE OF BIRTH:  04/24/1940   DATE OF ADMISSION:  03/23/2007  DATE OF DISCHARGE:  03/28/2007                               DISCHARGE SUMMARY   PRIMARY CARE PHYSICIAN:  Dr. Elease Hashimoto   FINAL DIAGNOSES:  1. Pulmonary embolus diagnosed by CT scan, this is a recurrent      pulmonary embolus, status post initiation of Lovenox bridge to      therapeutic INR.  2. History of hypertension.  3. Diabetes mellitus, controlled.  4. History of breast cancer in the past.  She typically follows with      Dr. Marko Plume.   STUDIES PERFORMED THIS ADMISSION:  1. She underwent a CT scan that is described as follows:  Acute      pulmonary embolus to subsegmental right middle lobe pulmonary      arteries and subsegmental pulmonary arteries to both lower lobes.      Tiny bilateral pulmonary nodules.  A CT of the chest is recommended      to be followed up within 3 months of this CT scan.  This can be      coordinated through her primary care physician.  The patient is      instructed that her primary care physician should pursue this      within the next 3 months.  2. Ultrasound of the abdomen which essentially revealed apparent      splenic cyst of 2 cm by ultrasound.   LABORATORY DATA:  INR at time of discharge revealed a INR of 1.8.  Her  CBC revealed a hemoglobin of 15.0, platelet count of 159.  Basic  metabolic panel revealed a BUN of 18, creatinine 0.88, sodium of 138,  potassium of 3.1 and potassium was repleted.   DISPOSITION AT PRESENT:  Andrea Simmons will be discharged home in medically  stable condition with a Lovenox bridge to a therapeutic INR.  Her doses  have been 5 mg of Coumadin, 7.5 and 7.5.  It is anticipated her dose  would be about 5 mg p.o. q.h.s.  I have requested that  she undergo close  followup with [place] and Dr. Elease Hashimoto, her primary care  physician, in the outpatient setting.   MEDICATION ON DISCHARGE:  She should resume her home medications as she  was on prior to admission including:  1. Hydrocodone/APAP 10/50 one p.o. q.i.d. p.r.n.  2. Gabapentin 300 mg daily.  3. Levothyroxine 25 mcg daily.  4. Cymbalta 60 mg q.a.m., 30 mg p.o. q.p.m.  5. Celebrex 200 mg daily, though she is instructed to use this very      sparingly especially in conjunction with Diovan as this can cause      problems with renal insufficiency.  6. Diovan/hydrochlorothiazide 160/12.5.  7. Ativan 0.5 mg p.o. q.h.s.  8. Omeprazole 20 mg p.o. q.a.m.  9. Zocor 40 mg q.a.m.  10.Aspirin 81 mg daily.  11.Fish oil 1 g t.i.d.  12.Chromium, supplemental nonprescription treatments she can      additionally take as well.   New medications include:  1. Lovenox 80 mg subcu b.i.d. until her INR is between 2 and 3, at      which point she is ordered to discontinue.  2. Coumadin 5 mg daily.  She should have the dose adjusted by her      primary care physician based on PT and INR within the next 2-3      days.  I have requested that the care management staff contact Dr.      Erick Blinks office prior to her discharge to establish a next lab      draw end and followup through their office and [place].   HISTORY OF PRESENTING ILLNESS:  For full details please refer to the H&P  as dictated by Dr. Henderson Cloud.  However, briefly, Andrea Simmons is a 70 year old  female who presented to the emergency department with acute onset of  shortness of breath accompanied by pleuritic-type chest pain.  She had a  previous history of pulmonary embolus last year which she had concluded  several months of treatment.  She had only been off for several weeks.   HOSPITAL COURSE:  She was discovered to have pulmonary emboli on CT  scan.  In any event, she achieved an INR of 1.8 with inpatient   coumadinization in addition to low-molecular-weight heparin.  Her 2-D  echocardiogram revealed an ejection fraction of 65% and her RV did not  appear enlarged, and lower extremity Dopplers were not revealing of deep  venous thrombosis.      Cyril Mourning, D.O.  Electronically Signed     ESS/MEDQ  D:  03/28/2007  T:  03/28/2007  Job:  [id number]   cc:   Andrea Simmons, M.D.

## 2010-08-09 ENCOUNTER — Other Ambulatory Visit: Payer: Self-pay | Admitting: Internal Medicine

## 2010-08-17 ENCOUNTER — Ambulatory Visit (INDEPENDENT_AMBULATORY_CARE_PROVIDER_SITE_OTHER): Payer: Medicare Other | Admitting: Internal Medicine

## 2010-08-17 ENCOUNTER — Encounter: Payer: Self-pay | Admitting: Internal Medicine

## 2010-08-17 ENCOUNTER — Other Ambulatory Visit (INDEPENDENT_AMBULATORY_CARE_PROVIDER_SITE_OTHER): Payer: Medicare Other

## 2010-08-17 VITALS — BP 132/72 | HR 68 | Temp 97.7°F | Ht 65.0 in | Wt 166.0 lb

## 2010-08-17 DIAGNOSIS — R109 Unspecified abdominal pain: Secondary | ICD-10-CM

## 2010-08-17 DIAGNOSIS — R1900 Intra-abdominal and pelvic swelling, mass and lump, unspecified site: Secondary | ICD-10-CM

## 2010-08-17 LAB — CREATININE, SERUM: Creatinine, Ser: 1.1 mg/dL (ref 0.4–1.2)

## 2010-08-17 NOTE — Patient Instructions (Signed)
It was good to see you today. Test(s) ordered today. Your results will be called to you after review (48-72hours after test completion). If any changes need to be made, you will be notified at that time. we'll make referral for CT scan as discussed. Our office will contact you regarding appointment(s) once made. Keep next appointment as scheduled, call sooner if problems

## 2010-08-17 NOTE — Progress Notes (Signed)
Subjective:    Patient ID: Andrea Simmons, female    DOB: 22-Jan-1941, 70 y.o.   MRN: [id number]  HPI  here for "knot" to left side of umbilcus Incidentally noted while dressing 4 days ago - No precipitating trauma, not painful No fever, no bowel changes Concerned for cancer given hx breast ca --  Also reviewed chronic medical issues:  hypothyroid - follows with endo for same - no skin, hair or weight changes - needs refills on meds soon - recent labs ok per pt at endo office   hx recurrent PE - 2008, 1/12009 - chronic anticoag ongoing for same -no dyspnea or CP  OA/fibro - follows with rheum for same - pain generally well controlled on current meds - improved with steroid injection in L hip - hopes to put off THR for as long as possible   dyslipidemia-  On statin - changed from simva 80 to lipitor 40, now crestor 20 qod - RUQ symptoms predated med changes - no muscle weakness or other probs   HTN - reports compliance with ongoing medical treatment and no changes in medication dose or frequency. denies adverse side effects related to current therapy.   hx Abdominal Pain 10/2009 - mostly improved, rare epidsodes now - RUQ and RLQ location - reviewed abd u/s 10/2009: fatty liver, no GB stones - pt feels symptoms related to constipation  Past Medical History  Diagnosis Date  . Malignant neoplasm of breast (female), unspecified site 1993    L breast s/p mastectomy and tamoxifen x 46yrs  . Hyperlipidemia   . Hypertension   . Diabetes mellitus type II   . 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 GERD (gastroesophageal reflux disease)   . Anemia   . Fibromyalgia   . Unspecified hypothyroidism   . Anxiety   . Depression      Review of Systems  Genitourinary: Negative for dysuria and hematuria.  Skin: Negative for rash and wound.  Neurological: Negative for headaches.       Objective:   Physical Exam BP 132/72  Pulse 68   Temp(Src) 97.7 F (36.5 C) (Oral)  Ht 5\' 5"  (1.651 m)  Wt 166 lb (75.297 kg)  BMI 27.62 kg/m2  SpO2 95% Physical Exam  Constitutional: She is oriented to person, place, and time. She appears well-developed and well-nourished. No distress.  Cardiovascular: Normal rate, regular rhythm and normal heart sounds.  No murmur heard. No BLE edema. Pulmonary/Chest: Effort normal and breath sounds normal. No respiratory distress. She has no wheezes.  Abdominal: Soft. Bowel sounds are normal. She exhibits no distension. There is no tenderness.  Skin: Skin is warm and dry. No rash noted. No erythema.  tiny 1cm firm subcutaneous nodule vs cyst to upper left of umbilicus - nontender, no bruise Psychiatric: She has a normal mood and affect. Her behavior is normal. Judgment and thought content normal.       Lab Results  Component Value Date   WBC 6.1 10/25/2009   HGB 12.3 10/25/2009   HCT 34.9* 10/25/2009   PLT 213.0 10/25/2009   CHOL 169 04/29/2010   TRIG 215.0* 04/29/2010   HDL 37.60* 04/29/2010   LDLDIRECT 101.6 04/29/2010   ALT 20 10/25/2009   AST 27 10/25/2009   NA 142 10/25/2009   K 4.8 01/20/2010   CL 103 10/25/2009   CREATININE 1.0 10/25/2009   BUN 30* 10/25/2009   CO2 33* 10/25/2009  TSH 0.770 04/19/2009   INR 3.1 07/27/2010   HGBA1C 6.5 08/27/2008     Assessment & Plan:  Left abdominal wall mass, appears subcutaneous - suspect tiny cyst or lipoma; also periodic RUQ pain - given hx breast cancer, pt requests scan "to be sure" - CT a/p ordered - prior Abd [place] 10/2009 reviewed - fatty liver, no mass  DM2 - will need to hold metformin for scan - check Cr now

## 2010-08-18 ENCOUNTER — Other Ambulatory Visit: Payer: Self-pay | Admitting: Internal Medicine

## 2010-08-22 ENCOUNTER — Telehealth: Payer: Self-pay | Admitting: *Deleted

## 2010-08-22 ENCOUNTER — Ambulatory Visit (INDEPENDENT_AMBULATORY_CARE_PROVIDER_SITE_OTHER): Payer: Medicare Other | Admitting: *Deleted

## 2010-08-22 ENCOUNTER — Ambulatory Visit (INDEPENDENT_AMBULATORY_CARE_PROVIDER_SITE_OTHER)
Admission: RE | Admit: 2010-08-22 | Discharge: 2010-08-22 | Disposition: A | Discharge status: Home / Self Care | Payer: Medicare Other | Source: Ambulatory Visit | Attending: Internal Medicine | Admitting: Internal Medicine

## 2010-08-22 DIAGNOSIS — R1900 Intra-abdominal and pelvic swelling, mass and lump, unspecified site: Secondary | ICD-10-CM

## 2010-08-22 DIAGNOSIS — I2699 Other pulmonary embolism without acute cor pulmonale: Secondary | ICD-10-CM

## 2010-08-22 LAB — POCT INR: INR: 2.9

## 2010-08-22 MED ORDER — IOHEXOL 300 MG/ML  SOLN
100.0000 mL | Freq: Once | INTRAMUSCULAR | Status: AC | PRN
  Administered 2010-08-22: 100 mL via INTRAVENOUS

## 2010-08-22 NOTE — Telephone Encounter (Signed)
Message copied by Rockwell [place] on Mon Aug 22, 2010  5:40 PM ------      Message from: Gwendolyn Grant A      Created: Mon Aug 22, 2010  4:50 PM       Please call patient - normal CT test results, no evidence for any problems or cancer. No medication changes recommended. Thanks.

## 2010-08-22 NOTE — Telephone Encounter (Signed)
LMOM of normal tests results w/contact name & number for any further questions and/or concerns.

## 2010-09-19 ENCOUNTER — Ambulatory Visit (INDEPENDENT_AMBULATORY_CARE_PROVIDER_SITE_OTHER): Payer: Medicare Other | Admitting: *Deleted

## 2010-09-19 DIAGNOSIS — I2699 Other pulmonary embolism without acute cor pulmonale: Secondary | ICD-10-CM

## 2010-09-19 LAB — POCT INR: INR: 2.4

## 2010-10-19 ENCOUNTER — Ambulatory Visit (INDEPENDENT_AMBULATORY_CARE_PROVIDER_SITE_OTHER): Payer: Medicare Other | Admitting: *Deleted

## 2010-10-19 DIAGNOSIS — I2699 Other pulmonary embolism without acute cor pulmonale: Secondary | ICD-10-CM

## 2010-10-19 LAB — POCT INR: INR: 2.8

## 2010-10-20 ENCOUNTER — Encounter: Payer: Medicare Other | Admitting: *Deleted

## 2010-11-02 ENCOUNTER — Ambulatory Visit: Payer: Self-pay | Admitting: Internal Medicine

## 2010-11-14 ENCOUNTER — Ambulatory Visit (INDEPENDENT_AMBULATORY_CARE_PROVIDER_SITE_OTHER): Payer: Medicare Other | Admitting: Internal Medicine

## 2010-11-14 ENCOUNTER — Encounter: Payer: Self-pay | Admitting: Internal Medicine

## 2010-11-14 DIAGNOSIS — E785 Hyperlipidemia, unspecified: Secondary | ICD-10-CM

## 2010-11-14 DIAGNOSIS — E119 Type 2 diabetes mellitus without complications: Secondary | ICD-10-CM

## 2010-11-14 DIAGNOSIS — I10 Essential (primary) hypertension: Secondary | ICD-10-CM

## 2010-11-14 NOTE — Progress Notes (Signed)
  Subjective:    Patient ID: Andrea Simmons, female    DOB: Oct 17, 1940, 70 y.o.   MRN: [id number]  HPI   here for follow up - reviewed chronic medical issues:  hypothyroid - follows with endo for same - no skin, hair or weight changes - recent labs ok per pt at endo office   hx recurrent PE - 2008, 1/12009 - chronic anticoag ongoing for same -no dyspnea or chest pain   OA/fibromyalgia - follows with rheum for same - pain generally well controlled on current meds, started fentanyl 10/2010 - improved with steroid injection in L hip - hopes to put off THR for as long as possible   dyslipidemia-  On statin - changed from simva 80 to lipitor 40, now crestor 20 qod - RUQ symptoms predated med changes - no muscle weakness or other probs   HTN - reports compliance with ongoing medical treatment and no changes in medication dose or frequency. denies adverse side effects related to current therapy.   hx Abdominal Pain 10/2009 - mostly improved, rare epidsodes now - RUQ and RLQ location - reviewed abd u/s 10/2009: fatty liver, no GB stones - pt feels symptoms related to constipation  Past Medical History  Diagnosis Date  . Malignant neoplasm of breast (female), unspecified site 1993    L breast s/p mastectomy and tamoxifen x 40yrs  . Hyperlipidemia   . Hypertension   . Diabetes mellitus type II   . 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 GERD (gastroesophageal reflux disease)   . Anemia   . Fibromyalgia   . Unspecified hypothyroidism   . Anxiety   . Depression      Review of Systems  Genitourinary: Negative for dysuria and hematuria.  Skin: Negative for rash.  Neurological: Negative for headaches.       Objective:   Physical Exam  BP 118/62  Pulse 66  Temp(Src) 98.3 F (36.8 C) (Oral)  Ht 5\' 4"  (1.626 m)  Wt 160 lb (72.576 kg)  BMI 27.46 kg/m2  SpO2 94% Constitutional: She is oriented to person, place, and time. She appears  well-developed and well-nourished. No distress.  Cardiovascular: Normal rate, regular rhythm and normal heart sounds.  No murmur heard. No BLE edema. Pulmonary/Chest: Effort normal and breath sounds normal. No respiratory distress. She has no wheezes.  Abdominal: Soft. Bowel sounds are normal. She exhibits no distension. There is no tenderness.  Skin: Skin is warm and dry. No rash noted. No erythema.  tiny 1cm firm subcutaneous nodule vs cyst to upper left of umbilicus - nontender, no bruise Psychiatric: She has a normal mood and affect. Her behavior is normal. Judgment and thought content normal.       Lab Results  Component Value Date   WBC 6.1 10/25/2009   HGB 12.3 10/25/2009   HCT 34.9* 10/25/2009   PLT 213.0 10/25/2009   CHOL 169 04/29/2010   TRIG 215.0* 04/29/2010   HDL 37.60* 04/29/2010   LDLDIRECT 101.6 04/29/2010   ALT 20 10/25/2009   AST 27 10/25/2009   NA 142 10/25/2009   K 4.8 01/20/2010   CL 103 10/25/2009   CREATININE 1.1 08/17/2010   BUN 30* 10/25/2009   CO2 33* 10/25/2009   TSH 0.770 04/19/2009   INR 2.8 10/19/2010   HGBA1C 6.5 08/27/2008     Assessment & Plan:   See problem list. Medications and labs reviewed today.

## 2010-11-14 NOTE — Patient Instructions (Signed)
It was good to see you today. We have reviewed your prior records including labs and tests today Medications reviewed, no changes at this time. Consider the shingles vaccine as we discussed and talk with your insurance Please schedule followup in 6-12 months, call sooner if problems.

## 2010-11-14 NOTE — Assessment & Plan Note (Signed)
BP Readings from Last 3 Encounters:  11/14/10 118/62  08/17/10 132/72  04/29/10 132/70   The current medical regimen is effective;  continue present plan and medications.

## 2010-11-14 NOTE — Assessment & Plan Note (Signed)
On crestor qod, prev simva but TGs uncontrolled and med interaction on simva

## 2010-11-14 NOTE — Assessment & Plan Note (Signed)
Follows with endo for same - defer labs to Dr. Chalmers Cater - no med changed recommended

## 2010-11-16 ENCOUNTER — Ambulatory Visit (INDEPENDENT_AMBULATORY_CARE_PROVIDER_SITE_OTHER): Payer: Medicare Other | Admitting: *Deleted

## 2010-11-16 DIAGNOSIS — I2699 Other pulmonary embolism without acute cor pulmonale: Secondary | ICD-10-CM

## 2010-11-16 LAB — POCT INR: INR: 2.4

## 2010-11-21 ENCOUNTER — Other Ambulatory Visit: Payer: Self-pay | Admitting: Internal Medicine

## 2010-12-08 ENCOUNTER — Ambulatory Visit (INDEPENDENT_AMBULATORY_CARE_PROVIDER_SITE_OTHER): Payer: Medicare Other | Admitting: Internal Medicine

## 2010-12-08 ENCOUNTER — Encounter: Payer: Self-pay | Admitting: Internal Medicine

## 2010-12-08 VITALS — BP 134/62 | HR 77 | Ht 63.5 in | Wt 163.0 lb

## 2010-12-08 DIAGNOSIS — I2699 Other pulmonary embolism without acute cor pulmonale: Secondary | ICD-10-CM

## 2010-12-08 DIAGNOSIS — J984 Other disorders of lung: Secondary | ICD-10-CM

## 2010-12-08 DIAGNOSIS — J309 Allergic rhinitis, unspecified: Secondary | ICD-10-CM

## 2010-12-08 LAB — CBC
HCT: 41.4
HCT: 42.9
HCT: 43.3
Hemoglobin: 15
Hemoglobin: 13.2
MCHC: 34.4
MCHC: 34.8
MCHC: 34.7
MCV: 90.4
MCV: 90.9
MCV: 92.4
MCV: 92.7
MCV: 92.4
Platelets: 135 — ABNORMAL LOW
Platelets: 150
RBC: 4.63
RBC: 4.11
RDW: 12.9
RDW: 13
WBC: 5.2
WBC: 7
WBC: 8.2

## 2010-12-08 LAB — DIFFERENTIAL
Basophils Relative: 0
Eosinophils Absolute: 0.1
Eosinophils Relative: 1
Lymphs Abs: 2.2

## 2010-12-08 LAB — PROTEIN S, TOTAL: Protein S Ag, Total: 130 % (ref 70–140)

## 2010-12-08 LAB — POCT CARDIAC MARKERS
CKMB, poc: 1.6
CKMB, poc: 1.1
Myoglobin, poc: 68.4
Myoglobin, poc: 40
Myoglobin, poc: 50.1
Operator id: 151321
Troponin i, poc: <0.05
Troponin i, poc: <0.05

## 2010-12-08 LAB — PROTEIN S ACTIVITY: Protein S Activity: 88 % (ref 69–129)

## 2010-12-08 LAB — COMPREHENSIVE METABOLIC PANEL
ALT: 22
AST: 26
Albumin: 4
Calcium: 9.3
Chloride: 108
Creatinine, Ser: 0.77
GFR calc Af Amer: >60
Sodium: 143
Total Bilirubin: 0.5

## 2010-12-08 LAB — BASIC METABOLIC PANEL
CO2: 26
Chloride: 102
Glucose, Bld: 101 — ABNORMAL HIGH
Potassium: 3.1 — ABNORMAL LOW
Sodium: 136

## 2010-12-08 LAB — POCT I-STAT CREATININE
Creatinine, Ser: 1
Operator id: 285491
Operator id: 192351

## 2010-12-08 LAB — I-STAT 8, (EC8 V) (CONVERTED LAB)
Chloride: 109
Chloride: 109
Glucose, Bld: 168 — ABNORMAL HIGH
HCT: 39
Potassium: 3.7
TCO2: 27
TCO2: 26
pCO2, Ven: 41.9 — ABNORMAL LOW
pCO2, Ven: 36.7 — ABNORMAL LOW
pH, Ven: 7.403 — ABNORMAL HIGH
pH, Ven: 7.433 — ABNORMAL HIGH

## 2010-12-08 LAB — PROTIME-INR
Prothrombin Time: 21 — ABNORMAL HIGH
Prothrombin Time: 35.3 — ABNORMAL HIGH

## 2010-12-08 LAB — BETA-2-GLYCOPROTEIN I ABS, IGG/M/A: Beta-2-Glycoprotein I IgA: <4 U/mL (ref <10)

## 2010-12-08 LAB — LUPUS ANTICOAGULANT PANEL: PTT Lupus Anticoagulant: 47.4 (ref 36.3–48.8)

## 2010-12-08 LAB — HOMOCYSTEINE: Homocysteine: 6.8

## 2010-12-08 LAB — ANTITHROMBIN III: AntiThromb III Func: 109 (ref 76–126)

## 2010-12-08 LAB — PROTEIN C ACTIVITY: Protein C Activity: 200 % — ABNORMAL HIGH (ref 75–133)

## 2010-12-08 LAB — CARDIOLIPIN ANTIBODIES, IGG, IGM, IGA: Anticardiolipin IgG: <7 — ABNORMAL LOW (ref <11)

## 2010-12-08 LAB — FACTOR 5 LEIDEN

## 2010-12-08 NOTE — Progress Notes (Signed)
Subjective:    Patient ID: Andrea Simmons, female    DOB: [birth year], 70 y.o.   MRN: [id number]  HPI 12/08/10- 70 year old female never smoker followed for history of pulmonary embolism x2/Coumadin, lung nodules, allergic rhinitis, complicated by history (L)breast cancer 1993 Last here- 12/09/2009- we reviewed her history of pulmonary embolism in 2008 and 2009 attributed to breast cancer and travel. She reports itching and burning of eyes, watering of eyes. No nasal congestion sneezing or drainage. No wheeze cough chest pain or palpitations, leg pain or swelling. Admits seasonal affective disorder. Chest x-ray 12/09/2009 was stable: CHEST - 2 VIEW  Comparison: 09/01/2008  Findings: The tiny nodules shown on prior chest CT are not readily  apparent on conventional radiography, likely due to conventional  radiography is reduced sensitivity for nodules of this size.  Left axillary clips and postoperative findings along the left  breast noted.  Cardiac and mediastinal contours appear unremarkable.  No pleural effusion identified. Lungs appear clear.  IMPRESSION:  1. The tiny nodules shown on prior CT are not readily apparent on  conventional radiography. On the most recent CT from 2010, these  nodules would characterized as a benign due to the stability.  Provider: Lelon Huh    Review of Systems Per history of present illness Constitutional:   No-   weight loss, night sweats, fevers, chills, fatigue, lassitude. HEENT:   No-  headaches, difficulty swallowing, tooth/dental problems, sore throat,       +  sneezing, itching, ear ache, nasal congestion, post nasal drip,  CV:  No-   chest pain, orthopnea, PND, swelling in lower extremities, anasarca, dizziness, palpitations Resp: No-   shortness of breath with exertion or at rest.              No-   productive cough,  No non-productive cough,  No-  coughing up of blood.              No-   change in color of mucus.  No- wheezing.   Skin:  No-   rash or lesions. GI:  No-   heartburn, indigestion, abdominal pain, nausea, vomiting, diarrhea,                 change in bowel habits, loss of appetite GU: No-   dysuria, change in color of urine, no urgency or frequency.  No- flank pain. MS:  No-   joint pain or swelling.  No- decreased range of motion.  No- back pain. Neuro- grossly normal  Psych:  No- change in mood or affect. No depression or anxiety.  No memory loss.      Objective:   Physical Exam General- Alert, Oriented, Affect-appropriate, Distress- none acute Skin- rash-none, lesions- none, excoriation- none Lymphadenopathy- none Head- atraumatic            Eyes- Gross vision intact, PERRLA, conjunctivae clear secretions            Ears- Hearing, canals-normal            Nose- Clear, no-Septal dev, mucus, polyps, erosion, perforation             Throat- Mallampati II , mucosa clear , drainage- none, tonsils- atrophic Neck- flexible , trachea midline, no stridor , thyroid nl, carotid no bruit Chest - symmetrical excursion , unlabored           Heart/CV- RRR , no murmur , no gallop  , no rub, nl s1 s2                           -  JVD- none , edema- none, stasis changes- none, varices- none           Lung- clear to P&A, wheeze- none, cough- none , dullness-none, rub- none           Chest wall-  Abd- tender-no, distended-no, bowel sounds-present, HSM- no Br/ Gen/ Rectal- Not done, not indicated Extrem- cyanosis- none, clubbing, none, atrophy- none, strength- nl Neuro- grossly intact to observation         Assessment & Plan:

## 2010-12-08 NOTE — Patient Instructions (Signed)
We agreed to wait on CXR- please call as needed  Suggest you might try an otc allergy eye drop to see if it helps- Visine AC, Allaway, Naphcon     You could also try a bland artificial tear to see if that is good enough

## 2010-12-09 LAB — I-STAT 8, (EC8 V) (CONVERTED LAB)
BUN: 14
Chloride: 106
HCT: 36
Hemoglobin: 12.2
Operator id: 282201
Potassium: 3.7
Sodium: 138
pCO2, Ven: 38 — ABNORMAL LOW

## 2010-12-09 LAB — URINALYSIS, ROUTINE W REFLEX MICROSCOPIC
Glucose, UA: 250 — AB
Protein, ur: NEGATIVE

## 2010-12-09 LAB — URINE MICROSCOPIC-ADD ON

## 2010-12-09 LAB — PROTIME-INR: Prothrombin Time: 24.1 — ABNORMAL HIGH

## 2010-12-12 NOTE — Assessment & Plan Note (Signed)
I discussed a chest x-ray report. Respecting issues of cost radiation and potential yield, decision was made to wait on repeat chest x-ray in the absence of symptoms.

## 2010-12-12 NOTE — Assessment & Plan Note (Signed)
Originally pulmonary emboli or related to her cancer and cancer chemotherapy lack of mobility while traveling. There has been no suggestion of recurrence.

## 2010-12-12 NOTE — Assessment & Plan Note (Addendum)
Mild conjunctivitis. I explained the difference between dry eye and allergic. She is going to try over-the-counter allergy eyedrops. Rhinitis is well controlled.

## 2010-12-14 ENCOUNTER — Ambulatory Visit (INDEPENDENT_AMBULATORY_CARE_PROVIDER_SITE_OTHER): Payer: Medicare Other | Admitting: *Deleted

## 2010-12-14 DIAGNOSIS — I2699 Other pulmonary embolism without acute cor pulmonale: Secondary | ICD-10-CM

## 2010-12-14 LAB — POCT INR: INR: 2.2

## 2010-12-16 ENCOUNTER — Inpatient Hospital Stay (INDEPENDENT_AMBULATORY_CARE_PROVIDER_SITE_OTHER)
Admission: RE | Admit: 2010-12-16 | Discharge: 2010-12-16 | Disposition: A | Discharge status: Home / Self Care | Payer: Medicare Other | Source: Ambulatory Visit | Attending: Family Medicine | Admitting: Family Medicine

## 2010-12-16 ENCOUNTER — Telehealth: Payer: Self-pay

## 2010-12-16 DIAGNOSIS — N390 Urinary tract infection, site not specified: Secondary | ICD-10-CM

## 2010-12-16 LAB — POCT URINALYSIS DIP (DEVICE)
Glucose, UA: NEGATIVE mg/dL
Nitrite: NEGATIVE
Urobilinogen, UA: 0.2 mg/dL (ref 0.0–1.0)

## 2010-12-16 NOTE — Telephone Encounter (Signed)
Pt advised that UA order has been placed and she can have her husband drop off specimen until 5 pm today.

## 2010-12-16 NOTE — Telephone Encounter (Signed)
Pt believes that she has a bladder infection and is having severe lower back pain and frequency. Pt is requesting order for UA that can be brought in by her husband. Okay to order UA?

## 2010-12-16 NOTE — Telephone Encounter (Signed)
Yes order done

## 2010-12-19 LAB — URINE CULTURE

## 2010-12-28 ENCOUNTER — Ambulatory Visit (INDEPENDENT_AMBULATORY_CARE_PROVIDER_SITE_OTHER): Payer: Medicare Other | Admitting: Internal Medicine

## 2010-12-28 ENCOUNTER — Encounter: Payer: Self-pay | Admitting: Internal Medicine

## 2010-12-28 VITALS — BP 122/70 | HR 90 | Temp 98.1°F | Ht 65.0 in | Wt 161.0 lb

## 2010-12-28 DIAGNOSIS — N390 Urinary tract infection, site not specified: Secondary | ICD-10-CM

## 2010-12-28 LAB — POCT URINALYSIS DIPSTICK
Glucose, UA: NEGATIVE
Ketones, UA: NEGATIVE
Spec Grav, UA: 1.015

## 2010-12-28 NOTE — Patient Instructions (Signed)
It was good to see you today. Your bladder symptoms have resolved - no need for any other antibiotics - stay hydrated and call us if you have any recurrent symptoms

## 2010-12-28 NOTE — Progress Notes (Signed)
Subjective:    Patient ID: Andrea Simmons, female    DOB: 09-18-1940, 70 y.o.   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 denies adverse side effects related to current therapy.   hx Abdominal Pain 10/2009 - mostly improved, rare epidsodes now - RUQ and RLQ location - reviewed abd u/s 10/2009: fatty liver, no GB stones - pt feels symptoms related to constipation  Past Medical History  Diagnosis Date  . Malignant neoplasm of breast (female), unspecified site 1993    L breast s/p mastectomy and tamoxifen x 78yrs  . Hyperlipidemia   . Hypertension   . Diabetes mellitus type II   . 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 GERD (gastroesophageal reflux disease)   . Anemia   . Fibromyalgia   . Unspecified hypothyroidism   . Anxiety   . Depression      Review of Systems  Genitourinary: Negative for dysuria and hematuria.  Skin: Negative for rash.  Neurological: Negative for headaches.       Objective:   Physical Exam  BP 122/70  Pulse 90  Temp(Src) 98.1 F (36.7 C) (Oral)  Ht 5\' 5"  (1.651 m)  Wt 161 lb (73.029 kg)  BMI 26.79  kg/m2  SpO2 97% Constitutional: She appears well-developed and well-nourished. No distress.  Cardiovascular: Normal rate, regular rhythm and normal heart sounds.  No murmur heard. No BLE edema. Pulmonary/Chest: Effort normal and breath sounds normal. No respiratory distress. She has no wheezes.  Abdominal: Soft. Bowel sounds are normal. She exhibits no distension. There is no tenderness.  Skin: Skin is warm and dry. No rash noted. No erythema. Psychiatric: She has a normal mood and affect. Her behavior is normal. Judgment and thought content normal.      Lab Results  Component Value Date   WBC 6.1 10/25/2009   HGB 12.3 10/25/2009   HCT 34.9* 10/25/2009   PLT 213.0 10/25/2009   CHOL 169 04/29/2010   TRIG 215.0* 04/29/2010   HDL 37.60* 04/29/2010   LDLDIRECT 101.6 04/29/2010   ALT 20 10/25/2009   AST 27 10/25/2009   NA 142 10/25/2009   K 4.8 01/20/2010   CL 103 10/25/2009   CREATININE 1.1 08/17/2010   BUN 30* 10/25/2009   CO2 33* 10/25/2009   TSH 0.770 04/19/2009   INR 2.2 12/14/2010   HGBA1C 6.5 08/27/2008     Assessment & Plan:   UTI 9/28 - ER and las reviewed - no evidence of recurrent infx on Udip - resolved after appropriate antibiotics -  education and reassurance provided

## 2010-12-28 NOTE — Telephone Encounter (Signed)
Addended by: Gwendolyn Grant A on: 12/28/2010 03:30 PM   Modules accepted: Orders

## 2011-01-11 ENCOUNTER — Ambulatory Visit (INDEPENDENT_AMBULATORY_CARE_PROVIDER_SITE_OTHER): Payer: Medicare Other | Admitting: *Deleted

## 2011-01-11 DIAGNOSIS — Z7901 Long term (current) use of anticoagulants: Secondary | ICD-10-CM

## 2011-01-11 DIAGNOSIS — I2699 Other pulmonary embolism without acute cor pulmonale: Secondary | ICD-10-CM

## 2011-01-11 LAB — POCT INR: INR: 2.5

## 2011-01-18 ENCOUNTER — Other Ambulatory Visit: Payer: Self-pay | Admitting: Cardiology

## 2011-01-18 ENCOUNTER — Telehealth: Payer: Self-pay | Admitting: *Deleted

## 2011-01-18 DIAGNOSIS — N390 Urinary tract infection, site not specified: Secondary | ICD-10-CM

## 2011-01-18 NOTE — Telephone Encounter (Signed)
Order placed for UA - will call after review

## 2011-01-18 NOTE — Telephone Encounter (Signed)
Notified pt with md response...01/18/11@10 :05am/LMB

## 2011-01-18 NOTE — Telephone Encounter (Signed)
Pt fill-out walk-in slip stating she saw md on 12/28/10 for f/u from ER for UTI. Pt states she still feel the pain or symptom of UTI here and there. Requesting an order to have urine check to see is there anything else to be concern of due to ongoing symptoms....01/18/11@8 :36am/LMB

## 2011-01-27 ENCOUNTER — Other Ambulatory Visit: Payer: Self-pay | Admitting: Internal Medicine

## 2011-01-27 ENCOUNTER — Other Ambulatory Visit: Payer: Medicare Other

## 2011-01-27 ENCOUNTER — Other Ambulatory Visit: Payer: Self-pay | Admitting: *Deleted

## 2011-01-27 DIAGNOSIS — N390 Urinary tract infection, site not specified: Secondary | ICD-10-CM

## 2011-01-27 LAB — URINALYSIS, ROUTINE W REFLEX MICROSCOPIC
Bilirubin Urine: NEGATIVE
Nitrite: NEGATIVE
Specific Gravity, Urine: 1.015 (ref 1.000–1.030)
Urobilinogen, UA: 0.2 (ref 0.0–1.0)
pH: 6 (ref 5.0–8.0)

## 2011-01-27 MED ORDER — CIPROFLOXACIN HCL 250 MG PO TABS: 250.0000 mg | ORAL_TABLET | Freq: Two times a day (BID) | ORAL | Status: AC

## 2011-01-27 MED ORDER — CIPROFLOXACIN HCL 250 MG PO TABS: 250.0000 mg | ORAL_TABLET | Freq: Two times a day (BID) | ORAL | Status: DC

## 2011-01-27 NOTE — Telephone Encounter (Signed)
Sent antibiotic to wrong pharmacy need to [facility] to [facility]. Resending to pharmacy

## 2011-02-06 ENCOUNTER — Encounter: Payer: Medicare Other | Admitting: *Deleted

## 2011-02-07 ENCOUNTER — Other Ambulatory Visit: Payer: Self-pay | Admitting: Internal Medicine

## 2011-02-08 MED ORDER — DULOXETINE HCL 30 MG PO CPEP: ORAL_CAPSULE | ORAL | Status: DC

## 2011-02-15 ENCOUNTER — Ambulatory Visit (INDEPENDENT_AMBULATORY_CARE_PROVIDER_SITE_OTHER): Payer: Medicare Other | Admitting: *Deleted

## 2011-02-15 DIAGNOSIS — I2699 Other pulmonary embolism without acute cor pulmonale: Secondary | ICD-10-CM

## 2011-02-15 DIAGNOSIS — Z7901 Long term (current) use of anticoagulants: Secondary | ICD-10-CM

## 2011-02-16 ENCOUNTER — Other Ambulatory Visit: Payer: Self-pay | Admitting: Internal Medicine

## 2011-02-16 MED ORDER — CELECOXIB 200 MG PO CAPS: 200.0000 mg | ORAL_CAPSULE | Freq: Every day | ORAL | Status: DC

## 2011-02-16 NOTE — Telephone Encounter (Signed)
Pt call need new rx for celebrex sent to [facility]...02/16/11@ 11:12am/LMB

## 2011-03-11 ENCOUNTER — Other Ambulatory Visit: Payer: Self-pay | Admitting: Internal Medicine

## 2011-03-29 ENCOUNTER — Ambulatory Visit (INDEPENDENT_AMBULATORY_CARE_PROVIDER_SITE_OTHER): Payer: Medicare Other | Admitting: *Deleted

## 2011-03-29 DIAGNOSIS — I2699 Other pulmonary embolism without acute cor pulmonale: Secondary | ICD-10-CM

## 2011-03-29 DIAGNOSIS — Z7901 Long term (current) use of anticoagulants: Secondary | ICD-10-CM

## 2011-03-29 LAB — POCT INR: INR: 4.3

## 2011-04-18 ENCOUNTER — Ambulatory Visit (INDEPENDENT_AMBULATORY_CARE_PROVIDER_SITE_OTHER): Payer: Medicare Other | Admitting: Pharmacist

## 2011-04-18 DIAGNOSIS — Z7901 Long term (current) use of anticoagulants: Secondary | ICD-10-CM

## 2011-04-18 DIAGNOSIS — I2699 Other pulmonary embolism without acute cor pulmonale: Secondary | ICD-10-CM

## 2011-04-18 LAB — POCT INR: INR: 3.2

## 2011-05-09 ENCOUNTER — Ambulatory Visit (INDEPENDENT_AMBULATORY_CARE_PROVIDER_SITE_OTHER): Payer: Medicare Other | Admitting: Pharmacist

## 2011-05-09 DIAGNOSIS — I2699 Other pulmonary embolism without acute cor pulmonale: Secondary | ICD-10-CM

## 2011-05-09 DIAGNOSIS — Z7901 Long term (current) use of anticoagulants: Secondary | ICD-10-CM

## 2011-05-12 ENCOUNTER — Other Ambulatory Visit: Payer: Self-pay | Admitting: Internal Medicine

## 2011-05-19 ENCOUNTER — Telehealth: Payer: Self-pay

## 2011-05-19 NOTE — Telephone Encounter (Signed)
Received PA place on md desk for completion... 05/19/11@4 :58pm/LMB

## 2011-05-19 NOTE — Telephone Encounter (Signed)
Pt called stating that PA is needed for omeprazole. Call [phone]  ID [id number]

## 2011-05-19 NOTE — Telephone Encounter (Signed)
Called insurance spoke with rep faxing over PA form to be completed. Case ID # [id number].... 05/19/11@4 :45pm/LMB

## 2011-05-22 NOTE — Telephone Encounter (Signed)
Faxed PA back to insurance comp awaiting on approval status... 05/22/11@1 :23pm/LMB

## 2011-05-23 ENCOUNTER — Other Ambulatory Visit: Payer: Self-pay

## 2011-05-23 MED ORDER — OMEPRAZOLE 20 MG PO CPDR: 20.0000 mg | DELAYED_RELEASE_CAPSULE | Freq: Every day | ORAL | Status: DC

## 2011-05-23 NOTE — Telephone Encounter (Signed)
Received PA med has been approved. Called pt no answer left info on vm, fax approval letter to pharmacy... 05/23/11@10 :40am/LMB

## 2011-05-24 ENCOUNTER — Ambulatory Visit (INDEPENDENT_AMBULATORY_CARE_PROVIDER_SITE_OTHER): Payer: Medicare Other | Admitting: Internal Medicine

## 2011-05-24 ENCOUNTER — Encounter: Payer: Self-pay | Admitting: Internal Medicine

## 2011-05-24 DIAGNOSIS — E119 Type 2 diabetes mellitus without complications: Secondary | ICD-10-CM

## 2011-05-24 DIAGNOSIS — E785 Hyperlipidemia, unspecified: Secondary | ICD-10-CM

## 2011-05-24 DIAGNOSIS — I10 Essential (primary) hypertension: Secondary | ICD-10-CM

## 2011-05-24 NOTE — Assessment & Plan Note (Signed)
BP Readings from Last 3 Encounters:  05/24/11 120/72  12/28/10 122/70  12/08/10 134/62   The current medical regimen is effective;  continue present plan and medications.

## 2011-05-24 NOTE — Assessment & Plan Note (Signed)
On low dose crestor qd, prev simva but TGs uncontrolled and med interaction on simva Labs followed by endo - defer mgmt of same to Dr Chalmers Cater

## 2011-05-24 NOTE — Progress Notes (Signed)
  Subjective:    Patient ID: Andrea Simmons, female    DOB: 1940/04/02, 71 y.o.   MRN: [id number]  HPI here for follow up - reviewed chronic medical issues:  hypothyroid - follows with endo for same - no skin, hair or weight changes - recent labs ok per pt at endo office   hx recurrent PE - 2008, 03/2007 - chronic anticoag ongoing for same -no dyspnea or chest pain   OA/fibromyalgia - follows with rheum for same - pain generally well controlled on current meds, started fentanyl 10/2010 - improved with steroid injection in L hip - hopes to put off THR for as long as possible   dyslipidemia-  On statin, follows [facility] for same - changed from simva 80 to lipitor 40, then crestor 20 qod, now crestor 10 qd -   HTN - reports compliance with ongoing medical treatment and no changes in medication dose or frequency. denies adverse side effects related to current therapy.    Past Medical History  Diagnosis Date  . Malignant neoplasm of breast (female), unspecified site 1993    L breast s/p mastectomy and tamoxifen x 78yrs  . Hyperlipidemia   . Hypertension   . Diabetes mellitus type II   . 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 GERD (gastroesophageal reflux disease)   . Anemia   . Fibromyalgia   . Unspecified hypothyroidism   . Anxiety   . Depression     Review of Systems  Constitutional: Negative for fever and fatigue.  HENT: Negative for hearing loss, nosebleeds, tinnitus and ear discharge. Ear pain: L ear discomfort x 3 days.   Genitourinary: Negative for dysuria and hematuria.  Skin: Negative for rash.  Neurological: Negative for headaches.       Objective:   Physical Exam  BP 120/72  Pulse 75  Temp(Src) 98.4 F (36.9 C) (Oral)  Ht 5\' 4"  (1.626 m)  Wt 163 lb 12.8 oz (74.299 kg)  BMI 28.12 kg/m2  SpO2 94% Wt Readings from Last 3 Encounters:  05/24/11 163 lb 12.8 oz (74.299 kg)  12/28/10 161 lb (73.029 kg)  12/08/10 163  lb (73.936 kg)   Constitutional: She appears well-developed and well-nourished. No distress.  HENT: B TMs ok, no erythema or effusion Cardiovascular: Normal rate, regular rhythm and normal heart sounds.  No murmur heard. No BLE edema. Pulmonary/Chest: Effort normal and breath sounds normal. No respiratory distress. She has no wheezes.  Psychiatric: She has a normal mood and affect. Her behavior is normal. Judgment and thought content normal.      Lab Results  Component Value Date   WBC 6.1 10/25/2009   HGB 12.3 10/25/2009   HCT 34.9* 10/25/2009   PLT 213.0 10/25/2009   CHOL 169 04/29/2010   TRIG 215.0* 04/29/2010   HDL 37.60* 04/29/2010   LDLDIRECT 101.6 04/29/2010   ALT 20 10/25/2009   AST 27 10/25/2009   NA 142 10/25/2009   K 4.8 01/20/2010   CL 103 10/25/2009   CREATININE 1.1 08/17/2010   BUN 30* 10/25/2009   CO2 33* 10/25/2009   TSH 0.770 04/19/2009   INR 3.0 05/09/2011   HGBA1C 6.5 08/27/2008     Assessment & Plan:   See problem list. Medications and labs reviewed today.

## 2011-05-24 NOTE — Assessment & Plan Note (Signed)
Follows with endo for same - defer labs to Dr. Chalmers Cater - no med changed recommended

## 2011-05-24 NOTE — Patient Instructions (Signed)
It was good to see you today. We have reviewed your prior records including labs and tests today Medications reviewed, no changes at this time. Have Dr Chalmers Cater send copies of labs and notes to use every visit - and continue care as ongoing Please schedule followup in 12 months, call sooner if problems.

## 2011-05-30 ENCOUNTER — Ambulatory Visit (INDEPENDENT_AMBULATORY_CARE_PROVIDER_SITE_OTHER): Payer: Medicare Other | Admitting: *Deleted

## 2011-05-30 DIAGNOSIS — I2699 Other pulmonary embolism without acute cor pulmonale: Secondary | ICD-10-CM

## 2011-05-30 DIAGNOSIS — Z7901 Long term (current) use of anticoagulants: Secondary | ICD-10-CM

## 2011-05-30 LAB — POCT INR: INR: 2.6

## 2011-06-06 ENCOUNTER — Other Ambulatory Visit: Payer: Self-pay | Admitting: Internal Medicine

## 2011-06-19 ENCOUNTER — Telehealth: Payer: Self-pay | Admitting: Internal Medicine

## 2011-06-19 NOTE — Telephone Encounter (Signed)
lmomtcb x1 for pt 

## 2011-06-20 NOTE — Telephone Encounter (Signed)
I spoke with pt and is aware of CDY recs. She voiced her understanding and had no questions

## 2011-06-20 NOTE — Telephone Encounter (Signed)
Yes, ok to fly. Move legs, and maybe get up and walk in isle once or twice to avoid stasis in the legs.

## 2011-06-20 NOTE — Telephone Encounter (Signed)
Called, spoke with pt.  States at the end of the month she may be flying from [place] to [place], [place].  She would like to know if this would be ok considering her hx of PE.  Reports this will be an approx 1 hr 20 min flight.  Dr. Annamaria Boots, pls advise.  Thank you.

## 2011-06-27 ENCOUNTER — Ambulatory Visit (INDEPENDENT_AMBULATORY_CARE_PROVIDER_SITE_OTHER): Payer: Medicare Other | Admitting: Pharmacist

## 2011-06-27 DIAGNOSIS — I2699 Other pulmonary embolism without acute cor pulmonale: Secondary | ICD-10-CM

## 2011-06-27 DIAGNOSIS — Z7901 Long term (current) use of anticoagulants: Secondary | ICD-10-CM

## 2011-06-27 LAB — POCT INR: INR: 3.4

## 2011-07-13 ENCOUNTER — Other Ambulatory Visit: Payer: Self-pay | Admitting: Internal Medicine

## 2011-07-18 ENCOUNTER — Other Ambulatory Visit: Payer: Self-pay | Admitting: *Deleted

## 2011-07-18 MED ORDER — VALSARTAN-HYDROCHLOROTHIAZIDE 320-25 MG PO TABS: 1.0000 | ORAL_TABLET | Freq: Every day | ORAL | Status: DC

## 2011-07-25 ENCOUNTER — Ambulatory Visit (INDEPENDENT_AMBULATORY_CARE_PROVIDER_SITE_OTHER): Payer: Medicare Other

## 2011-07-25 DIAGNOSIS — I2699 Other pulmonary embolism without acute cor pulmonale: Secondary | ICD-10-CM

## 2011-07-25 DIAGNOSIS — Z7901 Long term (current) use of anticoagulants: Secondary | ICD-10-CM

## 2011-07-25 LAB — POCT INR: INR: 2.3

## 2011-08-15 ENCOUNTER — Other Ambulatory Visit: Payer: Self-pay | Admitting: Internal Medicine

## 2011-08-22 ENCOUNTER — Ambulatory Visit (INDEPENDENT_AMBULATORY_CARE_PROVIDER_SITE_OTHER): Payer: Medicare Other

## 2011-08-22 DIAGNOSIS — Z7901 Long term (current) use of anticoagulants: Secondary | ICD-10-CM

## 2011-08-22 DIAGNOSIS — I2699 Other pulmonary embolism without acute cor pulmonale: Secondary | ICD-10-CM

## 2011-09-28 ENCOUNTER — Ambulatory Visit (INDEPENDENT_AMBULATORY_CARE_PROVIDER_SITE_OTHER): Payer: Medicare Other

## 2011-09-28 DIAGNOSIS — Z7901 Long term (current) use of anticoagulants: Secondary | ICD-10-CM

## 2011-09-28 DIAGNOSIS — I2699 Other pulmonary embolism without acute cor pulmonale: Secondary | ICD-10-CM

## 2011-11-09 ENCOUNTER — Ambulatory Visit (INDEPENDENT_AMBULATORY_CARE_PROVIDER_SITE_OTHER): Payer: Medicare Other | Admitting: *Deleted

## 2011-11-09 DIAGNOSIS — Z7901 Long term (current) use of anticoagulants: Secondary | ICD-10-CM

## 2011-11-09 DIAGNOSIS — I2699 Other pulmonary embolism without acute cor pulmonale: Secondary | ICD-10-CM

## 2011-11-09 LAB — POCT INR: INR: 2.2

## 2011-11-16 ENCOUNTER — Other Ambulatory Visit: Payer: Self-pay | Admitting: Internal Medicine

## 2011-11-22 ENCOUNTER — Encounter: Payer: Self-pay | Admitting: Pharmacist

## 2011-11-27 ENCOUNTER — Encounter: Payer: Self-pay | Admitting: Cardiology

## 2011-11-27 ENCOUNTER — Ambulatory Visit (INDEPENDENT_AMBULATORY_CARE_PROVIDER_SITE_OTHER): Payer: Medicare Other | Admitting: Cardiology

## 2011-11-27 VITALS — BP 118/82 | HR 61 | Ht 64.0 in | Wt 162.0 lb

## 2011-11-27 DIAGNOSIS — Z7901 Long term (current) use of anticoagulants: Secondary | ICD-10-CM

## 2011-11-27 DIAGNOSIS — I452 Bifascicular block: Secondary | ICD-10-CM | POA: Insufficient documentation

## 2011-11-27 DIAGNOSIS — I2699 Other pulmonary embolism without acute cor pulmonale: Secondary | ICD-10-CM

## 2011-11-27 DIAGNOSIS — F3289 Other specified depressive episodes: Secondary | ICD-10-CM

## 2011-11-27 DIAGNOSIS — F329 Major depressive disorder, single episode, unspecified: Secondary | ICD-10-CM

## 2011-11-27 DIAGNOSIS — E785 Hyperlipidemia, unspecified: Secondary | ICD-10-CM

## 2011-11-27 DIAGNOSIS — I10 Essential (primary) hypertension: Secondary | ICD-10-CM

## 2011-11-27 NOTE — Assessment & Plan Note (Signed)
No recurrence. Continue anticoagulation. I'll see her back in 2 years.

## 2011-11-27 NOTE — Assessment & Plan Note (Signed)
Has done well with anticoagulation with no complications. No recurrent pulmonary embolus.

## 2011-11-27 NOTE — Progress Notes (Signed)
HPI Andrea Simmons turns today for evaluation and management of her history of pulmonary embolus on 2 different occasions and lifelong Coumadin. She also is hyperlipidemia, hypertension, type 2 diabetes. She is followed in primary care.  She's very compliant. She's had no recurrent symptoms of pulmonary embolus. She denies any lower extremity edema or pain. She denies any bleeding or melena. Her last hemoglobin A1c was below 7%. She remains very active.  Past Medical History  Diagnosis Date  . Malignant neoplasm of breast (female), unspecified site 1993    L breast s/p mastectomy and tamoxifen x 24yrs  . Hyperlipidemia   . Hypertension   . Diabetes mellitus type II   . 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 GERD (gastroesophageal reflux disease)   . Anemia   . Fibromyalgia   . Unspecified hypothyroidism   . Anxiety   . Depression     Current Outpatient Prescriptions  Medication Sig Dispense Refill  . aspirin 81 MG tablet Take 81 mg by mouth daily.        . calcium carbonate (OS-CAL) 600 MG TABS Take 1,200 mg by mouth daily.        . CELEBREX 200 MG capsule TAKE 1 CAPSULE DAILY  90 capsule  0  . Choline Fenofibrate 135 MG capsule Take 135 mg by mouth daily.        . DULoxetine (CYMBALTA) 30 MG capsule Two capsules by mouth in the morning and One by mouth in the evening.  270 capsule  1  . fentaNYL (DURAGESIC - DOSED MCG/HR) 25 MCG/HR Change every 72 hours       . gabapentin (NEURONTIN) 300 MG capsule TAKE 1 CAPSULE DAILY AS NEEDED FOR PAIN  90 capsule  1  . HYDROcodone-acetaminophen (LORCET) 10-650 MG per tablet Take by mouth as needed. Taking 1/2 tablet in the afternoon as needed      . levothyroxine (SYNTHROID, LEVOTHROID) 25 MCG tablet TAKE 1 TABLET DAILY  90 tablet  1  . LORazepam (ATIVAN) 0.5 MG tablet Take 0.5 mg by mouth at bedtime.        . metFORMIN (GLUCOPHAGE) 500 MG tablet Take 1,000 mg by mouth 2 (two) times daily.        .  Multiple Vitamin (MULTIVITAMIN) tablet Take 1 tablet by mouth daily.        . Omega-3 Fatty Acids (FISH OIL) 500 MG CAPS Take 1 capsule by mouth 3 (three) times daily.        Marland Kitchen omeprazole (PRILOSEC) 20 MG capsule TAKE 1 CAPSULE DAILY  90 capsule  1  . ONE TOUCH ULTRA TEST test strip Use as directed      . ONETOUCH DELICA LANCETS MISC Use as directed      . rosuvastatin (CRESTOR) 20 MG tablet Take 10 mg by mouth daily.       . valsartan-hydrochlorothiazide (DIOVAN-HCT) 320-25 MG per tablet Take 1 tablet by mouth daily.  90 tablet  1  . warfarin (COUMADIN) 5 MG tablet USE AS DIRECTED  90 tablet  3    Allergies  Allergen Reactions  . Anaprox (Naproxen Sodium)   . Meperidine Hcl   . Morphine     Family History  Problem Relation Age of Onset  . Cancer Other     Breast  . Depression Other     Parent  . Arthritis Other     Parent, Grandparent  . Hypertension Other  Grandparent  . Hyperlipidemia Other   . Miscarriages / Stillbirths Other     Grandparent    History   Social History  . Marital Status: Married    Spouse Name: N/A    Number of Children: N/A  . Years of Education: N/A   Occupational History  . teacher's assistant   . hair dresser   . school bus driver    Social History Main Topics  . Smoking status: Never Smoker   . Smokeless tobacco: Not on file  . Alcohol Use: No  . Drug Use: No  . Sexually Active:    Other Topics Concern  . Not on file   Social History Narrative  . No narrative on file    ROS ALL NEGATIVE EXCEPT THOSE NOTED IN HPI  PE  General Appearance: well developed, well nourished in no acute distress, overweight HEENT: symmetrical face, PERRLA, good dentition  Neck: no JVD, thyromegaly, or adenopathy, trachea midline Chest: symmetric without deformity Cardiac: PMI non-displaced, RRR, normal S1, S2, no gallop or murmur Lung: clear to ausculation and percussion Vascular: all pulses full without bruits  Abdominal: nondistended,  nontender, good bowel sounds, no HSM, no bruits Extremities: no cyanosis, clubbing or edema, no sign of DVT, no varicosities  Skin: normal color, no rashes Neuro: alert and oriented x 3, non-focal Pysch: normal affect  EKG Normal sinus rhythm, left axis deviation, right bundle branch block  BMET    Component Value Date/Time   NA 142 10/25/2009 1450   K 4.8 01/20/2010 1157   CL 103 10/25/2009 1450   CO2 33* 10/25/2009 1450   GLUCOSE 143* 10/25/2009 1450   BUN 30* 10/25/2009 1450   CREATININE 1.1 08/17/2010 1639   CALCIUM 9.5 10/25/2009 1450   GFRNONAA 59.37 10/25/2009 1450   GFRAA  Value: >60        The eGFR has been calculated using the MDRD equation. This calculation has not been validated in all clinical situations. eGFR's persistently <60 mL/min signify possible Chronic Kidney Disease. 03/27/2007 0352    Lipid Panel     Component Value Date/Time   CHOL 169 04/29/2010 1353   TRIG 215.0* 04/29/2010 1353   HDL 37.60* 04/29/2010 1353   CHOLHDL 4 04/29/2010 1353   VLDL 43.0* 04/29/2010 1353   LDLCALC 125 04/19/2009    CBC    Component Value Date/Time   WBC 6.1 10/25/2009 1450   RBC 3.77* 10/25/2009 1450   HGB 12.3 10/25/2009 1450   HCT 34.9* 10/25/2009 1450   PLT 213.0 10/25/2009 1450   MCV 92.7 10/25/2009 1450   MCHC 35.3 10/25/2009 1450   RDW 12.5 10/25/2009 1450   LYMPHSABS 2.1 10/25/2009 1450   MONOABS 0.5 10/25/2009 1450   EOSABS 0.1 10/25/2009 1450   BASOSABS 0.0 10/25/2009 1450

## 2011-11-27 NOTE — Assessment & Plan Note (Signed)
This is a new finding. She is asymptomatic. No treatment necessary at  this time.

## 2011-11-27 NOTE — Patient Instructions (Addendum)
Your physician wants you to follow-up in: 2 years You will receive a reminder letter in the mail two months in advance. If you don't receive a letter, please call our office to schedule the follow-up appointment.

## 2011-12-05 ENCOUNTER — Other Ambulatory Visit: Payer: Self-pay

## 2011-12-05 MED ORDER — CELECOXIB 200 MG PO CAPS: 200.0000 mg | ORAL_CAPSULE | Freq: Every day | ORAL | Status: DC

## 2011-12-06 ENCOUNTER — Other Ambulatory Visit: Payer: Self-pay | Admitting: General Practice

## 2011-12-06 NOTE — Telephone Encounter (Signed)
Okay to submit requested medications to local pharmacy.

## 2011-12-06 NOTE — Telephone Encounter (Signed)
Received Fax from [facility] in [place] stating that Pt would like medicine filled there. It was sent today to [facility]. Please Advise. Thanks.

## 2011-12-07 MED ORDER — CELECOXIB 200 MG PO CAPS: 200.0000 mg | ORAL_CAPSULE | Freq: Every day | ORAL | Status: DC

## 2011-12-14 ENCOUNTER — Ambulatory Visit (INDEPENDENT_AMBULATORY_CARE_PROVIDER_SITE_OTHER): Payer: Medicare Other | Admitting: Internal Medicine

## 2011-12-14 ENCOUNTER — Encounter: Payer: Self-pay | Admitting: Internal Medicine

## 2011-12-14 VITALS — BP 112/64 | HR 68 | Ht 63.5 in | Wt 158.8 lb

## 2011-12-14 DIAGNOSIS — I2699 Other pulmonary embolism without acute cor pulmonale: Secondary | ICD-10-CM

## 2011-12-14 DIAGNOSIS — J309 Allergic rhinitis, unspecified: Secondary | ICD-10-CM

## 2011-12-14 DIAGNOSIS — J984 Other disorders of lung: Secondary | ICD-10-CM

## 2011-12-14 NOTE — Progress Notes (Signed)
Subjective:    Patient ID: Andrea Simmons, female    DOB: Jul 03, 1940, 71 y.o.   MRN: [id number]  HPI 12/08/10- 71 year old female never smoker followed for history of pulmonary embolism x2/Coumadin, lung nodules, allergic rhinitis, complicated by history (L)breast cancer 1993 Last here- 12/09/2009- we reviewed her history of pulmonary embolism in 2008 and 2009 attributed to breast cancer and travel. She reports itching and burning of eyes, watering of eyes. No nasal congestion sneezing or drainage. No wheeze cough chest pain or palpitations, leg pain or swelling. Admits seasonal affective disorder. Chest x-ray 12/09/2009 was stable: CHEST - 2 VIEW  Comparison: 09/01/2008  Findings: The tiny nodules shown on prior chest CT are not readily  apparent on conventional radiography, likely due to conventional  radiography is reduced sensitivity for nodules of this size.  Left axillary clips and postoperative findings along the left  breast noted.  Cardiac and mediastinal contours appear unremarkable.  No pleural effusion identified. Lungs appear clear.  IMPRESSION:  1. The tiny nodules shown on prior CT are not readily apparent on  conventional radiography. On the most recent CT from 2010, these  nodules would characterized as a benign due to the stability.  Provider: Lelon Simmons  12/14/11- 71 year old female never smoker followed for history of pulmonary embolism x2/Coumadin, lung nodules, allergic rhinitis, complicated by history (L)breast cancer 1993 PCP Dr Andrea Simmons  Denies SOB, wheezing, cough, or congestion. She wants to wait on flu vaccine until mid-November. Hx PE- INR followed at Coumadin clinic. She wants pulmonary followup long term because of her history. Legs swell a little only if she is standing all day Lung nodules considered benign and stable prior chest x-ray/CT. Allergic rhinitis fairly well controlled with some postnasal drip and watery nose.  Review of Systems- see  HPI Constitutional:   No-   weight loss, night sweats, fevers, chills, fatigue, lassitude. HEENT:   No-  headaches, difficulty swallowing, tooth/dental problems, sore throat,       +  sneezing, itching, ear ache, nasal congestion, post nasal drip,  CV:  No-   chest pain, orthopnea, PND, +swelling in lower extremities, no-anasarca, dizziness, palpitations Resp: No-   shortness of breath with exertion or at rest.              No-   productive cough,  No non-productive cough,  No-  coughing up of blood.              No-   change in color of mucus.  No- wheezing.   Skin: No-   rash or lesions. GI:  No-   heartburn, indigestion, abdominal pain, nausea, vomiting, GU: . MS:  No-   joint pain or swelling.   Neuro- grossly normal  Psych:  No- change in mood or affect. No depression or anxiety.  No memory loss.  Objective:   Physical Exam General- Alert, Oriented, Affect-appropriate, Distress- none acute. Talkative without respiratory limitation.  Skin- rash-none, lesions- none, excoriation- none Lymphadenopathy- none Head- atraumatic            Eyes- Gross vision intact, PERRLA, conjunctivae clear secretions            Ears- Hearing, canals-normal            Nose- Clear, no-Septal dev, mucus, polyps, erosion, perforation             Throat- Mallampati II , mucosa clear , drainage- none, tonsils- atrophic Neck- flexible , trachea midline, no stridor , thyroid nl, carotid  no bruit Chest - symmetrical excursion , unlabored           Heart/CV- RRR , no murmur , no gallop  , no rub, nl s1 s2                           - JVD- none , edema- none, stasis changes- none, varices- none           Lung- clear to P&A, wheeze- none, cough- none , dullness-none, rub- none           Chest wall-  Abd-  Br/ Gen/ Rectal- Not done, not indicated Extrem- cyanosis- none, clubbing, none, atrophy- none, strength- nl Neuro- grossly intact to observation Assessment & Plan:

## 2011-12-20 ENCOUNTER — Other Ambulatory Visit: Payer: Self-pay | Admitting: *Deleted

## 2011-12-20 ENCOUNTER — Ambulatory Visit (INDEPENDENT_AMBULATORY_CARE_PROVIDER_SITE_OTHER): Payer: Medicare Other | Admitting: *Deleted

## 2011-12-20 DIAGNOSIS — I2699 Other pulmonary embolism without acute cor pulmonale: Secondary | ICD-10-CM

## 2011-12-20 DIAGNOSIS — Z7901 Long term (current) use of anticoagulants: Secondary | ICD-10-CM

## 2011-12-20 MED ORDER — GABAPENTIN 300 MG PO CAPS: ORAL_CAPSULE | ORAL | Status: DC

## 2011-12-20 NOTE — Telephone Encounter (Signed)
R'cd fax from [place] for refill of Gabapentin.

## 2011-12-24 NOTE — Assessment & Plan Note (Signed)
This can be followed with occasional chest x-ray indicated for other reasons.

## 2011-12-24 NOTE — Assessment & Plan Note (Signed)
Good control clinically. We can see her if there are further problems.

## 2011-12-24 NOTE — Assessment & Plan Note (Signed)
Occasional antihistamine has been sufficient.

## 2011-12-24 NOTE — Patient Instructions (Addendum)
Problems are well controlled at this time. Coumadin clinic will continue to follow INR.  Please call if needed

## 2012-01-10 ENCOUNTER — Other Ambulatory Visit: Payer: Self-pay | Admitting: *Deleted

## 2012-01-10 MED ORDER — VALSARTAN-HYDROCHLOROTHIAZIDE 320-25 MG PO TABS: 1.0000 | ORAL_TABLET | Freq: Every day | ORAL | Status: DC

## 2012-01-10 NOTE — Telephone Encounter (Signed)
R'cd fax from [place] for refill of Diovan HCT

## 2012-01-31 ENCOUNTER — Ambulatory Visit (INDEPENDENT_AMBULATORY_CARE_PROVIDER_SITE_OTHER): Payer: Medicare Other | Admitting: *Deleted

## 2012-01-31 ENCOUNTER — Other Ambulatory Visit: Payer: Self-pay | Admitting: Gynecology

## 2012-01-31 DIAGNOSIS — I2699 Other pulmonary embolism without acute cor pulmonale: Secondary | ICD-10-CM

## 2012-01-31 DIAGNOSIS — Z1231 Encounter for screening mammogram for malignant neoplasm of breast: Secondary | ICD-10-CM

## 2012-01-31 DIAGNOSIS — Z7901 Long term (current) use of anticoagulants: Secondary | ICD-10-CM

## 2012-02-14 ENCOUNTER — Other Ambulatory Visit: Payer: Self-pay | Admitting: Internal Medicine

## 2012-02-23 ENCOUNTER — Ambulatory Visit (INDEPENDENT_AMBULATORY_CARE_PROVIDER_SITE_OTHER): Payer: Medicare Other | Admitting: General Practice

## 2012-02-23 DIAGNOSIS — I2699 Other pulmonary embolism without acute cor pulmonale: Secondary | ICD-10-CM

## 2012-02-23 DIAGNOSIS — Z7901 Long term (current) use of anticoagulants: Secondary | ICD-10-CM

## 2012-02-26 ENCOUNTER — Other Ambulatory Visit: Payer: Self-pay | Admitting: *Deleted

## 2012-02-26 MED ORDER — OMEPRAZOLE 20 MG PO CPDR: DELAYED_RELEASE_CAPSULE | ORAL | Status: DC

## 2012-02-26 NOTE — Telephone Encounter (Signed)
R'cd fax from [place] for refill of Omeprazole.

## 2012-02-27 ENCOUNTER — Ambulatory Visit: Payer: Medicare Other

## 2012-02-29 ENCOUNTER — Other Ambulatory Visit: Payer: Self-pay | Admitting: General Practice

## 2012-02-29 MED ORDER — WARFARIN SODIUM 5 MG PO TABS: 5.0000 mg | ORAL_TABLET | Freq: Every day | ORAL | Status: DC

## 2012-03-15 ENCOUNTER — Ambulatory Visit (INDEPENDENT_AMBULATORY_CARE_PROVIDER_SITE_OTHER): Payer: Medicare Other | Admitting: General Practice

## 2012-03-15 DIAGNOSIS — Z7901 Long term (current) use of anticoagulants: Secondary | ICD-10-CM

## 2012-03-15 DIAGNOSIS — I2699 Other pulmonary embolism without acute cor pulmonale: Secondary | ICD-10-CM

## 2012-03-26 ENCOUNTER — Ambulatory Visit: Payer: Medicare Other

## 2012-04-03 ENCOUNTER — Ambulatory Visit (INDEPENDENT_AMBULATORY_CARE_PROVIDER_SITE_OTHER): Payer: Medicare Other | Admitting: General Practice

## 2012-04-03 ENCOUNTER — Encounter: Payer: Self-pay | Admitting: Internal Medicine

## 2012-04-03 ENCOUNTER — Ambulatory Visit (INDEPENDENT_AMBULATORY_CARE_PROVIDER_SITE_OTHER): Payer: Medicare Other | Admitting: Internal Medicine

## 2012-04-03 VITALS — BP 128/78 | HR 76 | Temp 97.3°F | Ht 63.5 in | Wt 158.0 lb

## 2012-04-03 DIAGNOSIS — I2699 Other pulmonary embolism without acute cor pulmonale: Secondary | ICD-10-CM

## 2012-04-03 DIAGNOSIS — M5380 Other specified dorsopathies, site unspecified: Secondary | ICD-10-CM

## 2012-04-03 DIAGNOSIS — Z7901 Long term (current) use of anticoagulants: Secondary | ICD-10-CM

## 2012-04-03 DIAGNOSIS — M62830 Muscle spasm of back: Secondary | ICD-10-CM

## 2012-04-03 LAB — POCT INR: INR: 3.1

## 2012-04-03 MED ORDER — DICLOFENAC SODIUM 75 MG PO TBEC: 75.0000 mg | DELAYED_RELEASE_TABLET | Freq: Two times a day (BID) | ORAL | Status: DC

## 2012-04-03 MED ORDER — CYCLOBENZAPRINE HCL 10 MG PO TABS: 10.0000 mg | ORAL_TABLET | Freq: Three times a day (TID) | ORAL | Status: DC | PRN

## 2012-04-03 NOTE — Patient Instructions (Addendum)
    Muscle Cramps Muscle cramps are due to sudden involuntary muscle contraction. This means you have no control over the tightening of a muscle (or muscles). Often there are no obvious causes. Muscle cramps may occur with overexertion. They may also occur with chilling of the muscles. An example of a muscle chilling activity is swimming. It is uncommon for cramps to be due to a serious underlying disorder. In most cases, muscle cramps improve (or leave) within minutes. CAUSES   Some common causes are:  Injury.   Infections, especially viral.   Abnormal levels of the salts and ions in your blood (electrolytes). This could happen if you are taking water pills (diuretics).   Blood vessel disease where not enough blood is getting to the muscles (intermittent claudication).  Some uncommon causes are:  Side effects of some medicine (such as lithium).   Alcohol abuse.   Diseases where there is soreness (inflammation) of the muscular system.  HOME CARE INSTRUCTIONS    It may be helpful to massage, stretch, and relax the affected muscle.   Taking a dose of over-the-counter diphenhydramine is helpful for night leg cramps.  SEEK MEDICAL CARE IF:   Cramps are frequent and not relieved with medicine. MAKE SURE YOU:    Understand these instructions.   Will watch your condition.   Will get help right away if you are not doing well or get worse.  Document Released: 08/26/2001 Document Revised: 05/29/2011 Document Reviewed: 02/26/2008 [facility] Patient Information 2013 [place].

## 2012-04-03 NOTE — Progress Notes (Signed)
Subjective:    Patient ID: Andrea Simmons, female    DOB: 10-15-1940, 72 y.o.   MRN: [id number]  HPI  Pt presents to the clinic today with c/o right back pain that extends up into her neck. This started 2 weeks ago. The pain is sharp and comes and goes. She has been putting bengay on it with some relief. She has never had pain like this before. She denies an injury to the area.  Review of Systems      Past Medical History  Diagnosis Date  . Malignant neoplasm of breast (female), unspecified site 1993    L breast s/p mastectomy and tamoxifen x 48yrs  . Hyperlipidemia   . Hypertension   . Diabetes mellitus type II   . 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 GERD (gastroesophageal reflux disease)   . Anemia   . Fibromyalgia   . Unspecified hypothyroidism   . Anxiety   . Depression     Current Outpatient Prescriptions  Medication Sig Dispense Refill  . aspirin 81 MG tablet Take 81 mg by mouth daily.        . calcium carbonate (OS-CAL) 600 MG TABS Take 1,200 mg by mouth daily.        . celecoxib (CELEBREX) 200 MG capsule Take 1 capsule (200 mg total) by mouth daily.  90 capsule  1  . Choline Fenofibrate 135 MG capsule Take 135 mg by mouth daily.        . DULoxetine (CYMBALTA) 30 MG capsule Two capsules by mouth in the morning and One by mouth in the evening.  270 capsule  1  . gabapentin (NEURONTIN) 300 MG capsule TAKE 1 CAPSULE DAILY AS NEEDED FOR PAIN  90 capsule  1  . HYDROcodone-acetaminophen (LORCET) 10-650 MG per tablet Take 1 tablet by mouth as needed.       Marland Kitchen levothyroxine (SYNTHROID, LEVOTHROID) 25 MCG tablet TAKE 1 TABLET BY MOUTH EVERY DAY  90 tablet  1  . LORazepam (ATIVAN) 0.5 MG tablet Take 0.5 mg by mouth at bedtime.        . metFORMIN (GLUCOPHAGE) 500 MG tablet Take 1,000 mg by mouth 2 (two) times daily.        . Multiple Vitamin (MULTIVITAMIN) tablet Take 1 tablet by mouth daily.        . Omega-3 Fatty Acids (FISH  OIL) 500 MG CAPS Take 1 capsule by mouth 2 (two) times daily.       Marland Kitchen omeprazole (PRILOSEC) 20 MG capsule TAKE 1 CAPSULE DAILY  30 capsule  5  . ONE TOUCH ULTRA TEST test strip Use as directed      . ONETOUCH DELICA LANCETS MISC Use as directed      . rosuvastatin (CRESTOR) 20 MG tablet Take 10 mg by mouth daily.       . valsartan-hydrochlorothiazide (DIOVAN-HCT) 320-25 MG per tablet Take 1 tablet by mouth daily.  90 tablet  1  . warfarin (COUMADIN) 5 MG tablet Take 1 tablet (5 mg total) by mouth daily. Take as directed by coumadin clinic.  90 tablet  2    Allergies  Allergen Reactions  . Anaprox (Naproxen Sodium)   . Meperidine Hcl   . Morphine     Family History  Problem Relation Age of Onset  . Cancer Other     Breast  . Depression Other     Parent  . Arthritis Other  Parent, Grandparent  . Hypertension Other     Grandparent  . Hyperlipidemia Other   . Miscarriages / Stillbirths Other     Grandparent    History   Social History  . Marital Status: Married    Spouse Name: N/A    Number of Children: N/A  . Years of Education: N/A   Occupational History  . teacher's assistant   . hair dresser   . school bus driver    Social History Main Topics  . Smoking status: Never Smoker   . Smokeless tobacco: Not on file  . Alcohol Use: No  . Drug Use: No  . Sexually Active:    Other Topics Concern  . Not on file   Social History Narrative  . No narrative on file     Constitutional: Denies fever, malaise, fatigue, headache or abrupt weight changes.  Musculoskeletal: Pt reports right shoulder back that extends up to her neck. Denies decrease in range of motion, difficulty with gait, muscle pain or joint swelling.  Skin: Denies redness, rashes, lesions or ulcercations.  Neurological: Denies numbness or tingling of the hands, dizziness, difficulty with memory, difficulty with speech or problems with balance and coordination.   No other specific complaints in a  complete review of systems (except as listed in HPI above).  Objective:   Physical Exam   BP 128/78  Pulse 76  Temp 97.3 F (36.3 C) (Oral)  Ht 5' 3.5" (1.613 m)  Wt 158 lb (71.668 kg)  BMI 27.55 kg/m2  SpO2 94% Wt Readings from Last 3 Encounters:  04/03/12 158 lb (71.668 kg)  12/14/11 158 lb 12.8 oz (72.031 kg)  11/27/11 162 lb (73.483 kg)    General: Appears herstated age, well developed, well nourished in NAD.  Cardiovascular: Normal rate and rhythm. S1,S2 noted.  No murmur, rubs or gallops noted. No JVD or BLE edema. No carotid bruits noted. Pulmonary/Chest: Normal effort and positive vesicular breath sounds. No respiratory distress. No wheezes, rales or ronchi noted.  Musculoskeletal: Pinpoint tenderness of the infraspinatus muscle on the right side of the back. Normal range of motion. No signs of joint swelling. No difficulty with gait.        Assessment & Plan:   Muscle spasm of back, new onset with additional workup required:  eRx given for Diclofenac 75 mg BID eRx given for Flexeril 10 mg TID prn Apply heating pad to the affected area for 20 minutes daily  RTC as needed or if symptoms persist

## 2012-04-17 ENCOUNTER — Ambulatory Visit: Payer: Medicare Other

## 2012-04-24 ENCOUNTER — Ambulatory Visit (INDEPENDENT_AMBULATORY_CARE_PROVIDER_SITE_OTHER): Payer: Medicare Other | Admitting: General Practice

## 2012-04-24 DIAGNOSIS — I2699 Other pulmonary embolism without acute cor pulmonale: Secondary | ICD-10-CM

## 2012-04-24 DIAGNOSIS — Z7901 Long term (current) use of anticoagulants: Secondary | ICD-10-CM

## 2012-05-16 ENCOUNTER — Ambulatory Visit: Payer: Medicare Other

## 2012-05-22 ENCOUNTER — Ambulatory Visit: Payer: Medicare Other | Admitting: Internal Medicine

## 2012-05-28 ENCOUNTER — Ambulatory Visit (INDEPENDENT_AMBULATORY_CARE_PROVIDER_SITE_OTHER): Payer: Medicare Other | Admitting: General Practice

## 2012-05-28 DIAGNOSIS — Z7901 Long term (current) use of anticoagulants: Secondary | ICD-10-CM

## 2012-05-28 DIAGNOSIS — I2699 Other pulmonary embolism without acute cor pulmonale: Secondary | ICD-10-CM

## 2012-05-29 ENCOUNTER — Ambulatory Visit: Payer: Medicare Other | Admitting: Internal Medicine

## 2012-05-29 DIAGNOSIS — Z0289 Encounter for other administrative examinations: Secondary | ICD-10-CM

## 2012-05-31 ENCOUNTER — Other Ambulatory Visit: Payer: Self-pay | Admitting: Internal Medicine

## 2012-06-03 ENCOUNTER — Other Ambulatory Visit: Payer: Self-pay | Admitting: *Deleted

## 2012-06-03 NOTE — Telephone Encounter (Signed)
Received fax pt needing [place] omeprazole. Contacted insurance gave clinical ?'s over phone. Med was approved with start date 05/13/12-06/03/13. Called pharmacy gave status they tried to re-run script still wouldn't go through. Pharmacy states will retry & if it still wouldn't go through will notify us back...Johny Chess

## 2012-06-07 ENCOUNTER — Ambulatory Visit
Admission: RE | Admit: 2012-06-07 | Discharge: 2012-06-07 | Disposition: A | Discharge status: Home / Self Care | Payer: Medicare Other | Source: Ambulatory Visit | Attending: Gynecology | Admitting: Gynecology

## 2012-06-07 DIAGNOSIS — Z1231 Encounter for screening mammogram for malignant neoplasm of breast: Secondary | ICD-10-CM

## 2012-06-16 ENCOUNTER — Other Ambulatory Visit: Payer: Self-pay | Admitting: Internal Medicine

## 2012-06-25 ENCOUNTER — Ambulatory Visit (INDEPENDENT_AMBULATORY_CARE_PROVIDER_SITE_OTHER): Payer: Medicare Other | Admitting: General Practice

## 2012-06-25 DIAGNOSIS — I2699 Other pulmonary embolism without acute cor pulmonale: Secondary | ICD-10-CM

## 2012-06-25 DIAGNOSIS — Z7901 Long term (current) use of anticoagulants: Secondary | ICD-10-CM

## 2012-07-15 ENCOUNTER — Other Ambulatory Visit: Payer: Self-pay | Admitting: Internal Medicine

## 2012-07-22 ENCOUNTER — Ambulatory Visit: Payer: Medicare Other | Admitting: Internal Medicine

## 2012-07-22 DIAGNOSIS — Z0289 Encounter for other administrative examinations: Secondary | ICD-10-CM

## 2012-08-05 ENCOUNTER — Other Ambulatory Visit: Payer: Self-pay | Admitting: Internal Medicine

## 2012-08-06 ENCOUNTER — Ambulatory Visit (INDEPENDENT_AMBULATORY_CARE_PROVIDER_SITE_OTHER): Payer: Medicare Other | Admitting: General Practice

## 2012-08-06 DIAGNOSIS — Z7901 Long term (current) use of anticoagulants: Secondary | ICD-10-CM

## 2012-08-06 DIAGNOSIS — I2699 Other pulmonary embolism without acute cor pulmonale: Secondary | ICD-10-CM

## 2012-08-22 LAB — HEPATIC FUNCTION PANEL: AST: 40 U/L — AB (ref 13–35)

## 2012-08-22 LAB — LIPID PANEL
HDL: 32 mg/dL — AB (ref 35–70)
LDL Cholesterol: 78 mg/dL
Triglycerides: 162 mg/dL — AB (ref 40–160)

## 2012-08-26 ENCOUNTER — Other Ambulatory Visit: Payer: Self-pay | Admitting: Internal Medicine

## 2012-09-12 ENCOUNTER — Other Ambulatory Visit: Payer: Self-pay | Admitting: Internal Medicine

## 2012-09-18 ENCOUNTER — Ambulatory Visit (INDEPENDENT_AMBULATORY_CARE_PROVIDER_SITE_OTHER): Payer: Medicare Other | Admitting: Internal Medicine

## 2012-09-18 ENCOUNTER — Encounter: Payer: Self-pay | Admitting: Internal Medicine

## 2012-09-18 ENCOUNTER — Ambulatory Visit (INDEPENDENT_AMBULATORY_CARE_PROVIDER_SITE_OTHER): Payer: Medicare Other | Admitting: General Practice

## 2012-09-18 VITALS — BP 138/70 | HR 78 | Temp 97.9°F | Wt 160.8 lb

## 2012-09-18 DIAGNOSIS — Z7901 Long term (current) use of anticoagulants: Secondary | ICD-10-CM

## 2012-09-18 DIAGNOSIS — IMO0001 Reserved for inherently not codable concepts without codable children: Secondary | ICD-10-CM

## 2012-09-18 DIAGNOSIS — Z13820 Encounter for screening for osteoporosis: Secondary | ICD-10-CM

## 2012-09-18 DIAGNOSIS — Z0000 Encounter for general adult medical examination without abnormal findings: Secondary | ICD-10-CM

## 2012-09-18 DIAGNOSIS — I2699 Other pulmonary embolism without acute cor pulmonale: Secondary | ICD-10-CM

## 2012-09-18 DIAGNOSIS — E119 Type 2 diabetes mellitus without complications: Secondary | ICD-10-CM

## 2012-09-18 MED ORDER — VALSARTAN-HYDROCHLOROTHIAZIDE 320-25 MG PO TABS: 1.0000 | ORAL_TABLET | Freq: Every day | ORAL | Status: DC

## 2012-09-18 MED ORDER — CELECOXIB 200 MG PO CAPS: 200.0000 mg | ORAL_CAPSULE | Freq: Two times a day (BID) | ORAL | Status: DC

## 2012-09-18 MED ORDER — GABAPENTIN 300 MG PO CAPS: 300.0000 mg | ORAL_CAPSULE | Freq: Every day | ORAL | Status: DC

## 2012-09-18 NOTE — Patient Instructions (Signed)
It was good to see you today. We have reviewed your prior records including labs and tests today Health Maintenance reviewed - consider the Shingles vaccine as discussed - all other recommended immunizations and age-appropriate screenings are up-to-date. we'll make referral for bone density scan . Our office will contact you regarding appointment(s) once made. Bring [place] a copy of labs from Dr Chalmers Cater as requested Medications reviewed and updated, no changes recommended at this time. Refill on medication(s) as discussed today. Please schedule followup in 12 months for annual wellness visit, call sooner if problems.  Health Maintenance, Females A healthy lifestyle and preventative care can promote health and wellness.  Maintain regular health, dental, and eye exams.  Eat a healthy diet. Foods like vegetables, fruits, whole grains, low-fat dairy products, and lean protein foods contain the nutrients you need without too many calories. Decrease your intake of foods high in solid fats, added sugars, and salt. Get information about a proper diet from your caregiver, if necessary.  Regular physical exercise is one of the most important things you can do for your health. Most adults should get at least 150 minutes of moderate-intensity exercise (any activity that increases your heart rate and causes you to sweat) each week. In addition, most adults need muscle-strengthening exercises on 2 or more days a week.   Maintain a healthy weight. The body mass index (BMI) is a screening tool to identify possible weight problems. It provides an estimate of body fat based on height and weight. Your caregiver can help determine your BMI, and can help you achieve or maintain a healthy weight. For adults 20 years and older:  A BMI below 18.5 is considered underweight.  A BMI of 18.5 to 24.9 is normal.  A BMI of 25 to 29.9 is considered overweight.  A BMI of 30 and above is considered obese.  Maintain normal  blood lipids and cholesterol by exercising and minimizing your intake of saturated fat. Eat a balanced diet with plenty of fruits and vegetables. Blood tests for lipids and cholesterol should begin at age 48 and be repeated every 5 years. If your lipid or cholesterol levels are high, you are over 50, or you are a high risk for heart disease, you may need your cholesterol levels checked more frequently.Ongoing high lipid and cholesterol levels should be treated with medicines if diet and exercise are not effective.  If you smoke, find out from your caregiver how to quit. If you do not use tobacco, do not start.  If you are pregnant, do not drink alcohol. If you are breastfeeding, be very cautious about drinking alcohol. If you are not pregnant and choose to drink alcohol, do not exceed 1 drink per day. One drink is considered to be 12 ounces (355 mL) of beer, 5 ounces (148 mL) of wine, or 1.5 ounces (44 mL) of liquor.  Avoid use of street drugs. Do not share needles with anyone. Ask for help if you need support or instructions about stopping the use of drugs.  High blood pressure causes heart disease and increases the risk of stroke. Blood pressure should be checked at least every 1 to 2 years. Ongoing high blood pressure should be treated with medicines, if weight loss and exercise are not effective.  If you are 7 to 70 years old, ask your caregiver if you should take aspirin to prevent strokes.  Diabetes screening involves taking a blood sample to check your fasting blood sugar level. This should be done once  every 3 years, after age 73, if you are within normal weight and without risk factors for diabetes. Testing should be considered at a younger age or be carried out more frequently if you are overweight and have at least 1 risk factor for diabetes.  Breast cancer screening is essential preventative care for women. You should practice "breast self-awareness." This means understanding the normal  appearance and feel of your breasts and may include breast self-examination. Any changes detected, no matter how small, should be reported to a caregiver. Women in their 80s and 30s should have a clinical breast exam (CBE) by a caregiver as part of a regular health exam every 1 to 3 years. After age 77, women should have a CBE every year. Starting at age 32, women should consider having a mammogram (breast X-ray) every year. Women who have a family history of breast cancer should talk to their caregiver about genetic screening. Women at a high risk of breast cancer should talk to their caregiver about having an MRI and a mammogram every year.  The Pap test is a screening test for cervical cancer. Women should have a Pap test starting at age 31. Between ages 16 and 81, Pap tests should be repeated every 2 years. Beginning at age 40, you should have a Pap test every 3 years as long as the past 3 Pap tests have been normal. If you had a hysterectomy for a problem that was not cancer or a condition that could lead to cancer, then you no longer need Pap tests. If you are between ages 11 and 12, and you have had normal Pap tests going back 10 years, you no longer need Pap tests. If you have had past treatment for cervical cancer or a condition that could lead to cancer, you need Pap tests and screening for cancer for at least 20 years after your treatment. If Pap tests have been discontinued, risk factors (such as a new sexual partner) need to be reassessed to determine if screening should be resumed. Some women have medical problems that increase the chance of getting cervical cancer. In these cases, your caregiver may recommend more frequent screening and Pap tests.  The human papillomavirus (HPV) test is an additional test that may be used for cervical cancer screening. The HPV test looks for the virus that can cause the cell changes on the cervix. The cells collected during the Pap test can be tested for HPV. The  HPV test could be used to screen women aged 15 years and older, and should be used in women of any age who have unclear Pap test results. After the age of 30, women should have HPV testing at the same frequency as a Pap test.  Colorectal cancer can be detected and often prevented. Most routine colorectal cancer screening begins at the age of 14 and continues through age 79. However, your caregiver may recommend screening at an earlier age if you have risk factors for colon cancer. On a yearly basis, your caregiver may provide home test kits to check for hidden blood in the stool. Use of a small camera at the end of a tube, to directly examine the colon (sigmoidoscopy or colonoscopy), can detect the earliest forms of colorectal cancer. Talk to your caregiver about this at age 62, when routine screening begins. Direct examination of the colon should be repeated every 5 to 10 years through age 61, unless early forms of pre-cancerous polyps or small growths are found.  Hepatitis  C blood testing is recommended for all people born from [birth year] through 1963 and any individual with known risks for hepatitis C.  Practice safe sex. Use condoms and avoid high-risk sexual practices to reduce the spread of sexually transmitted infections (STIs). Sexually active women aged 79 and younger should be checked for Chlamydia, which is a common sexually transmitted infection. Older women with new or multiple partners should also be tested for Chlamydia. Testing for other STIs is recommended if you are sexually active and at increased risk.  Osteoporosis is a disease in which the bones lose minerals and strength with aging. This can result in serious bone fractures. The risk of osteoporosis can be identified using a bone density scan. Women ages 21 and over and women at risk for fractures or osteoporosis should discuss screening with their caregivers. Ask your caregiver whether you should be taking a calcium supplement or vitamin D  to reduce the rate of osteoporosis.  Menopause can be associated with physical symptoms and risks. Hormone replacement therapy is available to decrease symptoms and risks. You should talk to your caregiver about whether hormone replacement therapy is right for you.  Use sunscreen with a sun protection factor (SPF) of 30 or greater. Apply sunscreen liberally and repeatedly throughout the day. You should seek shade when your shadow is shorter than you. Protect yourself by wearing long sleeves, pants, a wide-brimmed hat, and sunglasses year round, whenever you are outdoors.  Notify your caregiver of new moles or changes in moles, especially if there is a change in shape or color. Also notify your caregiver if a mole is larger than the size of a pencil eraser.  Stay current with your immunizations. Document Released: 09/19/2010 Document Revised: 05/29/2011 Document Reviewed: 09/19/2010 [facility] Patient Information 2014 [place].

## 2012-09-18 NOTE — Progress Notes (Signed)
Subjective:    Patient ID: Andrea Simmons, female    DOB: [birth year], 72 y.o.   MRN: [id number]  HPI  Here for medicare wellness  Diet: heart healthy, diabetic Physical activity: sedentary Depression/mood screen: negative Hearing: intact to whispered voice Visual acuity: grossly normal, performs annual eye exam  ADLs: capable Fall risk: none Home safety: good Cognitive evaluation: intact to orientation, naming, recall and repetition EOL planning: adv directives, full code/ I agree  I have personally reviewed and have noted 1. The patient's medical and social history 2. Their use of alcohol, tobacco or illicit drugs 3. Their current medications and supplements 4. The patient's functional ability including ADL's, fall risks, home safety risks and hearing or visual impairment. 5. Diet and physical activities 6. 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 denies adverse side effects related to current therapy.    Past Medical History  Diagnosis Date  . Malignant neoplasm of breast (female), unspecified site 1993    L breast s/p mastectomy and tamoxifen x 70yrs  . Hyperlipidemia   . Hypertension   . Diabetes mellitus type II   . 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 GERD (gastroesophageal reflux disease)   . Anemia   . Fibromyalgia   . Unspecified hypothyroidism   . Anxiety   . Depression    Family History  Problem Relation Age of Onset  . Cancer Other     Breast  . Depression Other     Parent  . Arthritis Other     Parent, Grandparent  . Hypertension Other     Grandparent  . Hyperlipidemia Other   . Miscarriages / Stillbirths Other     Grandparent   History  Substance Use Topics  . Smoking status: Never Smoker   . Smokeless tobacco: Not on file  . Alcohol Use: No    Review of Systems Constitutional: Negative for fever or weight change.  Respiratory: Negative for cough and shortness of breath.   Cardiovascular: Negative for chest pain or palpitations.  Gastrointestinal: Negative for abdominal pain, no bowel changes.  Musculoskeletal: Negative for gait problem or joint swelling.  Skin: Negative for rash.  Neurological: Negative for dizziness or headache.  No other specific complaints in a complete review of systems (except as listed in HPI above).     Objective:   Physical Exam  BP 138/70  Pulse 78  Temp(Src) 97.9 F (36.6 C) (Oral)  Wt 160 lb 12.8 oz (72.938 kg)  BMI 28.03 kg/m2  SpO2 96% Wt Readings from Last 3 Encounters:  09/18/12 160 lb 12.8 oz (72.938 kg)  04/03/12 158 lb (71.668 kg)  12/14/11 158 lb 12.8 oz (  72.031 kg)   Constitutional: She appears well-developed and well-nourished. No distress.  HENT: B TMs ok, no erythema or effusion Cardiovascular: Normal rate, regular rhythm and normal heart sounds.  No murmur heard. No BLE edema. Pulmonary/Chest: Effort normal and breath sounds normal. No respiratory distress. She has no wheezes.  Psychiatric: She has a normal mood and affect. Her behavior is normal. Judgment and thought content normal.      Lab Results  Component Value Date   WBC 6.1 10/25/2009   HGB 12.3 10/25/2009   HCT 34.9* 10/25/2009   PLT 213.0 10/25/2009   CHOL 169  04/29/2010   TRIG 215.0* 04/29/2010   HDL 37.60* 04/29/2010   LDLDIRECT 101.6 04/29/2010   ALT 20 10/25/2009   AST 27 10/25/2009   NA 142 10/25/2009   K 4.8 01/20/2010   CL 103 10/25/2009   CREATININE 1.1 08/17/2010   BUN 30* 10/25/2009   CO2 33* 10/25/2009   TSH 0.770 04/19/2009   INR 2.0 08/06/2012   HGBA1C 6.5 08/27/2008     Assessment & Plan:   AWV/CPX/v70.0 - Today patient counseled on age appropriate routine health concerns for screening and prevention, each reviewed and up to date or declined. Immunizations reviewed and up to date or declined. Labs/ECG reviewed. Risk factors for depression reviewed and negative. Hearing function and visual acuity are intact. ADLs screened and addressed as needed. Functional ability and level of safety reviewed and appropriate. Education, counseling and referrals performed based on assessed risks today. Patient provided with a copy of personalized plan for preventive services.  Also see problem list. Medications and labs reviewed today.

## 2012-09-18 NOTE — Assessment & Plan Note (Signed)
Follows with rheumatology for same On Cymbalta, Neurontin and pain medications as needed Interval history reviewed, advice to continue same

## 2012-09-18 NOTE — Assessment & Plan Note (Signed)
Follows with endo for same - defer labs to Dr. Chalmers Cater, ROI and pt will bring copy to [place]  no med changed recommended

## 2012-10-30 ENCOUNTER — Ambulatory Visit (INDEPENDENT_AMBULATORY_CARE_PROVIDER_SITE_OTHER): Payer: Medicare Other | Admitting: General Practice

## 2012-10-30 DIAGNOSIS — Z7901 Long term (current) use of anticoagulants: Secondary | ICD-10-CM

## 2012-10-30 DIAGNOSIS — I2699 Other pulmonary embolism without acute cor pulmonale: Secondary | ICD-10-CM

## 2012-10-30 LAB — POCT INR: INR: 1.9

## 2012-11-24 ENCOUNTER — Other Ambulatory Visit: Payer: Self-pay | Admitting: Internal Medicine

## 2012-12-11 ENCOUNTER — Ambulatory Visit (INDEPENDENT_AMBULATORY_CARE_PROVIDER_SITE_OTHER): Payer: Medicare Other | Admitting: *Deleted

## 2012-12-11 ENCOUNTER — Ambulatory Visit (INDEPENDENT_AMBULATORY_CARE_PROVIDER_SITE_OTHER): Payer: Medicare Other | Admitting: General Practice

## 2012-12-11 DIAGNOSIS — Z23 Encounter for immunization: Secondary | ICD-10-CM

## 2012-12-11 DIAGNOSIS — Z2911 Encounter for prophylactic immunotherapy for respiratory syncytial virus (RSV): Secondary | ICD-10-CM

## 2012-12-11 DIAGNOSIS — I2699 Other pulmonary embolism without acute cor pulmonale: Secondary | ICD-10-CM

## 2012-12-11 DIAGNOSIS — Z7901 Long term (current) use of anticoagulants: Secondary | ICD-10-CM

## 2012-12-11 LAB — POCT INR: INR: 2

## 2012-12-12 ENCOUNTER — Ambulatory Visit: Payer: Medicare Other | Admitting: Internal Medicine

## 2012-12-23 ENCOUNTER — Other Ambulatory Visit: Payer: Self-pay | Admitting: *Deleted

## 2012-12-23 MED ORDER — LEVOTHYROXINE SODIUM 25 MCG PO TABS: ORAL_TABLET | ORAL | Status: DC

## 2012-12-23 NOTE — Telephone Encounter (Signed)
Received fax stating pt no longer use mail service. Requesting new rx for her levothyroxine...lmb

## 2013-01-22 ENCOUNTER — Ambulatory Visit (INDEPENDENT_AMBULATORY_CARE_PROVIDER_SITE_OTHER): Payer: Medicare Other | Admitting: General Practice

## 2013-01-22 DIAGNOSIS — I2699 Other pulmonary embolism without acute cor pulmonale: Secondary | ICD-10-CM

## 2013-01-22 DIAGNOSIS — Z23 Encounter for immunization: Secondary | ICD-10-CM

## 2013-01-22 DIAGNOSIS — Z7901 Long term (current) use of anticoagulants: Secondary | ICD-10-CM

## 2013-01-22 LAB — POCT INR: INR: 2.6

## 2013-01-22 NOTE — Progress Notes (Signed)
Pre-visit discussion using our clinic review tool. No additional management support is needed unless otherwise documented below in the visit note.  

## 2013-01-23 ENCOUNTER — Encounter: Payer: Self-pay | Admitting: Internal Medicine

## 2013-01-23 ENCOUNTER — Ambulatory Visit (INDEPENDENT_AMBULATORY_CARE_PROVIDER_SITE_OTHER): Payer: Medicare Other | Admitting: Internal Medicine

## 2013-01-23 ENCOUNTER — Encounter (INDEPENDENT_AMBULATORY_CARE_PROVIDER_SITE_OTHER): Payer: Self-pay

## 2013-01-23 VITALS — BP 126/80 | HR 75 | Ht 63.25 in | Wt 159.2 lb

## 2013-01-23 DIAGNOSIS — J309 Allergic rhinitis, unspecified: Secondary | ICD-10-CM

## 2013-01-23 DIAGNOSIS — I2699 Other pulmonary embolism without acute cor pulmonale: Secondary | ICD-10-CM

## 2013-01-23 DIAGNOSIS — J302 Other seasonal allergic rhinitis: Secondary | ICD-10-CM

## 2013-01-23 NOTE — Progress Notes (Signed)
Subjective:    Patient ID: Andrea Simmons, female    DOB: February 11, 1941, 72 y.o.   MRN: [id number]  HPI 12/08/10- 72 year old female never smoker followed for history of pulmonary embolism x2/Coumadin, lung nodules, allergic rhinitis, complicated by history (L)breast cancer 1993 Last here- 12/09/2009- we reviewed her history of pulmonary embolism in 2008 and 2009 attributed to breast cancer and travel. She reports itching and burning of eyes, watering of eyes. No nasal congestion sneezing or drainage. No wheeze cough chest pain or palpitations, leg pain or swelling. Admits seasonal affective disorder. Chest x-ray 12/09/2009 was stable: CHEST - 2 VIEW  Comparison: 09/01/2008  Findings: The tiny nodules shown on prior chest CT are not readily  apparent on conventional radiography, likely due to conventional  radiography is reduced sensitivity for nodules of this size.  Left axillary clips and postoperative findings along the left  breast noted.  Cardiac and mediastinal contours appear unremarkable.  No pleural effusion identified. Lungs appear clear.  IMPRESSION:  1. The tiny nodules shown on prior CT are not readily apparent on  conventional radiography. On the most recent CT from 2010, these  nodules would characterized as a benign due to the stability.  Provider: Lelon Huh  12/14/11- 72 year old female never smoker followed for history of pulmonary embolism x2/Coumadin, lung nodules, allergic rhinitis, complicated by history (L)breast cancer 1993 PCP Dr Asa Lente  Denies SOB, wheezing, cough, or congestion. She wants to wait on flu vaccine until mid-November. Hx PE- INR followed at [facility] clinic. She wants pulmonary followup long term because of her history. Legs swell a little only if she is standing all day Lung nodules considered benign and stable prior chest x-ray/CT. Allergic rhinitis fairly well controlled with some postnasal drip and watery nose.  01/23/13- 72 year old female  never smoker followed for history of Pulmonary Embolism x2/Coumadin, lung nodules, allergic rhinitis, complicated by history (L)breast cancer 1993, DM, PCP Dr Asa Lente FOLLOWS FOR: Pt states her breathing has been pretty good; at times will get short winded. Still being followed by [facility]. Hx PE 2008, 2009( after coumadin stopped for endoscopy)> chronic coumadin. She is satisfied to continue Coumadin with no complications. Little rhinitis this fall. Does get dry eyes that burn and water.  Review of Systems- see HPI Constitutional:   No-   weight loss, night sweats, fevers, chills, fatigue, lassitude. HEENT:   No-  headaches, difficulty swallowing, tooth/dental problems, sore throat,       No- sneezing, itching, ear ache, nasal congestion, post nasal drip,  CV:  No-   chest pain, orthopnea, PND, +swelling in lower extremities, no-anasarca, dizziness, palpitations Resp: No-   shortness of breath with exertion or at rest.              No-   productive cough,  No non-productive cough,  No-  coughing up of blood.              No-   change in color of mucus.  No- wheezing.   Skin: No-   rash or lesions. GI:  No-   heartburn, indigestion, abdominal pain, nausea, vomiting, GU: . MS:  No-   joint pain or swelling.   Neuro- grossly normal  Psych:  No- change in mood or affect. No depression or anxiety.  No memory loss.  Objective:   Physical Exam General- Alert, Oriented, Affect-appropriate, Distress- none acute. Talkative without respiratory limitation.  Skin- rash-none, lesions- none, excoriation- none Lymphadenopathy- none Head- atraumatic  Eyes- Gross vision intact, PERRLA, conjunctivae clear secretions            Ears- Hearing, canals-normal            Nose- Clear, no-Septal dev, mucus, polyps, erosion, perforation             Throat- Mallampati II , mucosa clear , drainage- none, tonsils- atrophic. +gravelly voice Neck- flexible , trachea midline, no stridor , thyroid nl,  carotid no bruit Chest - symmetrical excursion , unlabored           Heart/CV- RRR , no murmur , no gallop  , no rub, nl s1 s2                           - JVD- none , edema- none, stasis changes- none, varices- none           Lung- clear to P&A, wheeze- none, cough- none , dullness-none, rub- none           Chest wall-  Abd-  Br/ Gen/ Rectal- Not done, not indicated Extrem- cyanosis- none, clubbing, none, atrophy- none, strength- nl Neuro- grossly intact to observation Assessment & Plan:

## 2013-01-23 NOTE — Patient Instructions (Signed)
Recommend continue Coumadin long-term   I will be happy to see you again if needed

## 2013-02-06 NOTE — Assessment & Plan Note (Signed)
This has been good fall season for her and she feels under good control.

## 2013-02-06 NOTE — Assessment & Plan Note (Signed)
We have recommended that long-term Coumadin for her in the absence of complications

## 2013-02-19 ENCOUNTER — Encounter: Payer: Self-pay | Admitting: *Deleted

## 2013-02-24 ENCOUNTER — Other Ambulatory Visit: Payer: Self-pay | Admitting: Internal Medicine

## 2013-02-24 NOTE — Telephone Encounter (Signed)
Refill done.  

## 2013-03-05 ENCOUNTER — Ambulatory Visit (INDEPENDENT_AMBULATORY_CARE_PROVIDER_SITE_OTHER): Payer: Medicare Other | Admitting: General Practice

## 2013-03-05 DIAGNOSIS — Z7901 Long term (current) use of anticoagulants: Secondary | ICD-10-CM

## 2013-03-05 DIAGNOSIS — I2699 Other pulmonary embolism without acute cor pulmonale: Secondary | ICD-10-CM

## 2013-03-05 NOTE — Progress Notes (Signed)
Pre-visit discussion using our clinic review tool. No additional management support is needed unless otherwise documented below in the visit note.  

## 2013-04-16 ENCOUNTER — Ambulatory Visit (INDEPENDENT_AMBULATORY_CARE_PROVIDER_SITE_OTHER): Payer: Medicare Other | Admitting: General Practice

## 2013-04-16 DIAGNOSIS — I2699 Other pulmonary embolism without acute cor pulmonale: Secondary | ICD-10-CM

## 2013-04-16 DIAGNOSIS — Z5181 Encounter for therapeutic drug level monitoring: Secondary | ICD-10-CM

## 2013-04-16 DIAGNOSIS — Z7901 Long term (current) use of anticoagulants: Secondary | ICD-10-CM

## 2013-04-16 LAB — POCT INR: INR: 2

## 2013-04-16 NOTE — Progress Notes (Signed)
Pre-visit discussion using our clinic review tool. No additional management support is needed unless otherwise documented below in the visit note.  

## 2013-05-08 ENCOUNTER — Other Ambulatory Visit: Payer: Self-pay

## 2013-05-08 DIAGNOSIS — Z1231 Encounter for screening mammogram for malignant neoplasm of breast: Secondary | ICD-10-CM

## 2013-05-08 DIAGNOSIS — Z9889 Other specified postprocedural states: Secondary | ICD-10-CM

## 2013-05-08 DIAGNOSIS — Z853 Personal history of malignant neoplasm of breast: Secondary | ICD-10-CM

## 2013-05-26 ENCOUNTER — Other Ambulatory Visit: Payer: Self-pay | Admitting: Internal Medicine

## 2013-05-28 ENCOUNTER — Ambulatory Visit (INDEPENDENT_AMBULATORY_CARE_PROVIDER_SITE_OTHER): Payer: Medicare Other | Admitting: General Practice

## 2013-05-28 DIAGNOSIS — Z5181 Encounter for therapeutic drug level monitoring: Secondary | ICD-10-CM

## 2013-05-28 DIAGNOSIS — I2699 Other pulmonary embolism without acute cor pulmonale: Secondary | ICD-10-CM

## 2013-05-28 DIAGNOSIS — Z7901 Long term (current) use of anticoagulants: Secondary | ICD-10-CM

## 2013-05-28 LAB — POCT INR: INR: 2.5

## 2013-05-28 NOTE — Progress Notes (Signed)
Pre visit review using our clinic review tool, if applicable. No additional management support is needed unless otherwise documented below in the visit note. 

## 2013-06-09 ENCOUNTER — Ambulatory Visit
Admission: RE | Admit: 2013-06-09 | Discharge: 2013-06-09 | Disposition: A | Discharge status: Home / Self Care | Payer: Medicare Other | Source: Ambulatory Visit

## 2013-06-09 DIAGNOSIS — Z1231 Encounter for screening mammogram for malignant neoplasm of breast: Secondary | ICD-10-CM

## 2013-06-09 DIAGNOSIS — Z9889 Other specified postprocedural states: Secondary | ICD-10-CM

## 2013-06-09 DIAGNOSIS — Z853 Personal history of malignant neoplasm of breast: Secondary | ICD-10-CM

## 2013-06-12 ENCOUNTER — Other Ambulatory Visit: Payer: Self-pay | Admitting: Gynecology

## 2013-06-12 DIAGNOSIS — R928 Other abnormal and inconclusive findings on diagnostic imaging of breast: Secondary | ICD-10-CM

## 2013-06-23 ENCOUNTER — Ambulatory Visit
Admission: RE | Admit: 2013-06-23 | Discharge: 2013-06-23 | Disposition: A | Discharge status: Home / Self Care | Payer: Medicare Other | Source: Ambulatory Visit | Attending: Gynecology | Admitting: Gynecology

## 2013-06-23 DIAGNOSIS — R928 Other abnormal and inconclusive findings on diagnostic imaging of breast: Secondary | ICD-10-CM

## 2013-06-24 ENCOUNTER — Other Ambulatory Visit: Payer: Self-pay | Admitting: *Deleted

## 2013-06-24 NOTE — Telephone Encounter (Signed)
Received fax pt needing PA on her omeprazole. Completed PA on cover-my-meds. Waiting on approval status...Johny Chess

## 2013-06-27 NOTE — Telephone Encounter (Signed)
Received PA back med has been approved notified pharmacy with approval status...Johny Chess

## 2013-07-02 ENCOUNTER — Ambulatory Visit (INDEPENDENT_AMBULATORY_CARE_PROVIDER_SITE_OTHER): Payer: Medicare Other | Admitting: General Practice

## 2013-07-02 DIAGNOSIS — I2699 Other pulmonary embolism without acute cor pulmonale: Secondary | ICD-10-CM

## 2013-07-02 DIAGNOSIS — Z5181 Encounter for therapeutic drug level monitoring: Secondary | ICD-10-CM

## 2013-07-02 DIAGNOSIS — Z7901 Long term (current) use of anticoagulants: Secondary | ICD-10-CM

## 2013-07-02 LAB — POCT INR: INR: 2.2

## 2013-07-02 NOTE — Progress Notes (Signed)
Pre visit review using our clinic review tool, if applicable. No additional management support is needed unless otherwise documented below in the visit note. 

## 2013-07-15 ENCOUNTER — Other Ambulatory Visit: Payer: Self-pay | Admitting: Internal Medicine

## 2013-07-15 ENCOUNTER — Other Ambulatory Visit: Payer: Self-pay | Admitting: General Practice

## 2013-07-15 MED ORDER — WARFARIN SODIUM 5 MG PO TABS: ORAL_TABLET | ORAL | Status: DC

## 2013-07-23 ENCOUNTER — Encounter (INDEPENDENT_AMBULATORY_CARE_PROVIDER_SITE_OTHER): Payer: Self-pay | Admitting: Surgery

## 2013-07-25 ENCOUNTER — Ambulatory Visit (INDEPENDENT_AMBULATORY_CARE_PROVIDER_SITE_OTHER): Payer: Medicare Other | Admitting: Surgery

## 2013-07-25 ENCOUNTER — Encounter (INDEPENDENT_AMBULATORY_CARE_PROVIDER_SITE_OTHER): Payer: Self-pay | Admitting: Surgery

## 2013-07-25 VITALS — BP 126/70 | HR 74 | Temp 97.4°F | Resp 16 | Ht 65.0 in | Wt 159.0 lb

## 2013-07-25 DIAGNOSIS — R928 Other abnormal and inconclusive findings on diagnostic imaging of breast: Secondary | ICD-10-CM

## 2013-07-25 DIAGNOSIS — R921 Mammographic calcification found on diagnostic imaging of breast: Secondary | ICD-10-CM

## 2013-07-25 NOTE — Progress Notes (Signed)
Patient ID: Andrea Simmons, female   DOB: December 15, 1940, 73 y.o.   MRN: [id number]  Chief Complaint  Patient presents with  . eval  right breast .    calcifications    HPI Andrea Simmons is a 73 y.o. female.   HPI This patient is referred by Andrea Simmons for a second opinion regarding calcifications of the right breast. She had her yearly screening mammography which documented calcifications in the right breast. These were unchanged over a years time. The radiologist recommended a followup mammogram in 6 months to document stability. A second opinion is requested regarding this. She has a previous history of breast cancer in the left breast back in 1993 and is status post mastectomy and TRAM flap. She has no history of drainage from the right breast. She is otherwise doing well Past Medical History  Diagnosis Date  . Malignant neoplasm of breast (female), unspecified site 1993    L breast s/p mastectomy and tamoxifen x 15yrs  . Hyperlipidemia   . Hypertension   . Diabetes mellitus type II   . 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 GERD (gastroesophageal reflux disease)   . Anemia   . Fibromyalgia   . Unspecified hypothyroidism   . Anxiety   . Depression     Past Surgical History  Procedure Laterality Date  . Appendectomy    . Tubal ligation    . Abdominal hysterectomy    . Breast surgery  1993    Left breast surgery/biopsy then reconstruction  . Spine surgery      Spinal Fusion x 2  . Eye surgery      Cataract  . Rotator cuff repair      Family History  Problem Relation Age of Onset  . Cancer Other     Breast  . Depression Other     Parent  . Arthritis Other     Parent, Grandparent  . Hypertension Other     Grandparent  . Hyperlipidemia Other   . Miscarriages / [place] Other     Grandparent    Social History History  Substance Use Topics  . Smoking status: Never Smoker   . Smokeless tobacco: Not on file  .  Alcohol Use: No    Allergies  Allergen Reactions  . Anaprox [Naproxen Sodium]   . Meperidine Hcl   . Morphine     Current Outpatient Prescriptions  Medication Sig Dispense Refill  . aspirin 81 MG tablet Take 81 mg by mouth daily.        . calcium carbonate (OS-CAL) 600 MG TABS Take 1,200 mg by mouth daily.        . celecoxib (CELEBREX) 200 MG capsule TAKE ONE CAPSULE BY MOUTH DAILY  90 capsule  1  . Choline Fenofibrate 135 MG capsule Take 135 mg by mouth daily.        . diclofenac sodium (VOLTAREN) 1 % GEL Apply topically 4 (four) times daily as needed.      . DULoxetine (CYMBALTA) 30 MG capsule Take 60 mg prior to dinner and 60 mg prior to bedtime.      . gabapentin (NEURONTIN) 300 MG capsule Take 1 capsule (300 mg total) by mouth at bedtime.  30 capsule  11  . HYDROcodone-acetaminophen (LORCET) 10-650 MG per tablet Take 1 tablet by mouth as needed.       Marland Kitchen levothyroxine (SYNTHROID, LEVOTHROID) 50 MCG tablet Take 50 mcg  by mouth daily before breakfast.      . LORazepam (ATIVAN) 0.5 MG tablet Take 0.5 mg by mouth at bedtime.        . metFORMIN (GLUCOPHAGE) 500 MG tablet Take 1,000 mg by mouth 2 (two) times daily.        . Multiple Vitamin (MULTIVITAMIN) tablet Take 1 tablet by mouth daily.        . Omega-3 Fatty Acids (FISH OIL) 500 MG CAPS Take 1 capsule by mouth 2 (two) times daily.       Marland Kitchen omeprazole (PRILOSEC) 20 MG capsule TAKE ONE CAPSULE BY MOUTH EVERY DAY  30 capsule  5  . ONE TOUCH ULTRA TEST test strip Use as directed      . ONETOUCH DELICA LANCETS MISC Use as directed      . rosuvastatin (CRESTOR) 20 MG tablet Take 10 mg by mouth daily.       . valsartan-hydrochlorothiazide (DIOVAN-HCT) 320-25 MG per tablet Take 1 tablet by mouth daily.  90 tablet  11  . warfarin (COUMADIN) 5 MG tablet Take as directed by coumadin clinic.  90 tablet  3   No current facility-administered medications for this visit.    Review of Systems Review of Systems  Constitutional: Negative for  fever, chills and unexpected weight change.  HENT: Negative for congestion, hearing loss, sore throat, trouble swallowing and voice change.   Eyes: Negative for visual disturbance.  Respiratory: Negative for cough and wheezing.   Cardiovascular: Negative for chest pain, palpitations and leg swelling.  Gastrointestinal: Negative for nausea, vomiting, abdominal pain, diarrhea, constipation, blood in stool, abdominal distention and anal bleeding.  Genitourinary: Negative for hematuria, vaginal bleeding and difficulty urinating.  Musculoskeletal: Negative for arthralgias.  Skin: Negative for rash and wound.  Neurological: Negative for seizures, syncope and headaches.  Hematological: Negative for adenopathy. Does not bruise/bleed easily.  Psychiatric/Behavioral: Negative for confusion.    Blood pressure 126/70, pulse 74, temperature 97.4 F (36.3 C), resp. rate 16, height 5\' 5"  (1.651 m), weight 159 lb (72.122 kg).  Physical Exam Physical Exam  Constitutional: She is oriented to person, place, and time. She appears well-developed and well-nourished. No distress.  HENT:  Head: Normocephalic and atraumatic.  Right Ear: External ear normal.  Left Ear: External ear normal.  Nose: Nose normal.  Mouth/Throat: Oropharynx is clear and moist. No oropharyngeal exudate.  Eyes: Conjunctivae are normal. Pupils are equal, round, and reactive to light. Right eye exhibits no discharge. Left eye exhibits no discharge. No scleral icterus.  Neck: Normal range of motion. Neck supple. No tracheal deviation present.  Cardiovascular: Normal rate, regular rhythm, normal heart sounds and intact distal pulses.   No murmur heard. Pulmonary/Chest: Effort normal and breath sounds normal. No respiratory distress. She has no wheezes.  Lymphadenopathy:    She has no cervical adenopathy.    She has no axillary adenopathy.  Neurological: She is alert and oriented to person, place, and time.  Skin: Skin is warm and dry.  No rash noted. No erythema.  Psychiatric: Her behavior is normal. Judgment normal.  Breasts: There is a TRAM reconstruction of the left breast. There are no palpable masses in the right breast.  Data Reviewed I have reviewed her mammogram demonstrating calcifications in the right breast which had not changed for a year. A 6 month followup was recommended to document stability  Assessment    Right breast calcifications     Plan    I agree with radiology. I suspect these  are benign. I reassured the patient. I did encourage her that if she remained anxious that she could have radiology perform a stereotactic biopsy. Unfortunately, she is on Coumadin which makes this decision slightly difficult. I will see her as needed        Harl Bowie 07/25/2013, 11:49 AM

## 2013-08-13 ENCOUNTER — Ambulatory Visit (INDEPENDENT_AMBULATORY_CARE_PROVIDER_SITE_OTHER): Payer: Medicare Other | Admitting: Family Medicine

## 2013-08-13 DIAGNOSIS — I2699 Other pulmonary embolism without acute cor pulmonale: Secondary | ICD-10-CM

## 2013-08-13 DIAGNOSIS — Z7901 Long term (current) use of anticoagulants: Secondary | ICD-10-CM

## 2013-08-13 DIAGNOSIS — Z5181 Encounter for therapeutic drug level monitoring: Secondary | ICD-10-CM

## 2013-08-13 LAB — POCT INR: INR: 1.8

## 2013-08-25 ENCOUNTER — Other Ambulatory Visit: Payer: Self-pay | Admitting: Internal Medicine

## 2013-09-01 ENCOUNTER — Emergency Department (HOSPITAL_COMMUNITY): Payer: Medicare Other

## 2013-09-01 ENCOUNTER — Emergency Department (HOSPITAL_COMMUNITY)
Admission: EM | Admit: 2013-09-01 | Discharge: 2013-09-01 | Disposition: A | Discharge status: Home / Self Care | Payer: Medicare Other | Attending: Emergency Medicine | Admitting: Emergency Medicine

## 2013-09-01 ENCOUNTER — Encounter (HOSPITAL_COMMUNITY): Payer: Self-pay | Admitting: Emergency Medicine

## 2013-09-01 DIAGNOSIS — M79605 Pain in left leg: Secondary | ICD-10-CM

## 2013-09-01 DIAGNOSIS — R519 Headache, unspecified: Secondary | ICD-10-CM

## 2013-09-01 DIAGNOSIS — Z7901 Long term (current) use of anticoagulants: Secondary | ICD-10-CM

## 2013-09-01 DIAGNOSIS — E785 Hyperlipidemia, unspecified: Secondary | ICD-10-CM | POA: Insufficient documentation

## 2013-09-01 DIAGNOSIS — M79609 Pain in unspecified limb: Secondary | ICD-10-CM

## 2013-09-01 DIAGNOSIS — Z86718 Personal history of other venous thrombosis and embolism: Secondary | ICD-10-CM | POA: Insufficient documentation

## 2013-09-01 DIAGNOSIS — Z791 Long term (current) use of non-steroidal anti-inflammatories (NSAID): Secondary | ICD-10-CM | POA: Insufficient documentation

## 2013-09-01 DIAGNOSIS — Z7982 Long term (current) use of aspirin: Secondary | ICD-10-CM | POA: Insufficient documentation

## 2013-09-01 DIAGNOSIS — Z853 Personal history of malignant neoplasm of breast: Secondary | ICD-10-CM | POA: Insufficient documentation

## 2013-09-01 DIAGNOSIS — E039 Hypothyroidism, unspecified: Secondary | ICD-10-CM | POA: Insufficient documentation

## 2013-09-01 DIAGNOSIS — Z862 Personal history of diseases of the blood and blood-forming organs and certain disorders involving the immune mechanism: Secondary | ICD-10-CM | POA: Insufficient documentation

## 2013-09-01 DIAGNOSIS — IMO0001 Reserved for inherently not codable concepts without codable children: Secondary | ICD-10-CM | POA: Insufficient documentation

## 2013-09-01 DIAGNOSIS — F329 Major depressive disorder, single episode, unspecified: Secondary | ICD-10-CM | POA: Insufficient documentation

## 2013-09-01 DIAGNOSIS — Z86711 Personal history of pulmonary embolism: Secondary | ICD-10-CM | POA: Insufficient documentation

## 2013-09-01 DIAGNOSIS — K219 Gastro-esophageal reflux disease without esophagitis: Secondary | ICD-10-CM | POA: Insufficient documentation

## 2013-09-01 DIAGNOSIS — I10 Essential (primary) hypertension: Secondary | ICD-10-CM | POA: Insufficient documentation

## 2013-09-01 DIAGNOSIS — E119 Type 2 diabetes mellitus without complications: Secondary | ICD-10-CM | POA: Insufficient documentation

## 2013-09-01 DIAGNOSIS — F411 Generalized anxiety disorder: Secondary | ICD-10-CM | POA: Insufficient documentation

## 2013-09-01 DIAGNOSIS — R51 Headache: Secondary | ICD-10-CM | POA: Insufficient documentation

## 2013-09-01 DIAGNOSIS — F3289 Other specified depressive episodes: Secondary | ICD-10-CM | POA: Insufficient documentation

## 2013-09-01 LAB — BASIC METABOLIC PANEL
BUN: 21 mg/dL (ref 6–23)
CHLORIDE: 104 meq/L (ref 96–112)
CO2: 25 mEq/L (ref 19–32)
Calcium: 9.5 mg/dL (ref 8.4–10.5)
Creatinine, Ser: 0.73 mg/dL (ref 0.50–1.10)
GFR, EST NON AFRICAN AMERICAN: 84 mL/min — AB (ref >90)
Glucose, Bld: 119 mg/dL — ABNORMAL HIGH (ref 70–99)
POTASSIUM: 4.3 meq/L (ref 3.7–5.3)
SODIUM: 141 meq/L (ref 137–147)

## 2013-09-01 LAB — CBC
HCT: 34.5 % — ABNORMAL LOW (ref 36.0–46.0)
Hemoglobin: 11.1 g/dL — ABNORMAL LOW (ref 12.0–15.0)
MCH: 29.4 pg (ref 26.0–34.0)
MCHC: 32.2 g/dL (ref 30.0–36.0)
MCV: 91.5 fL (ref 78.0–100.0)
Platelets: 210 10*3/uL (ref 150–400)
RBC: 3.77 MIL/uL — AB (ref 3.87–5.11)
RDW: 13.6 % (ref 11.5–15.5)
WBC: 4.6 10*3/uL (ref 4.0–10.5)

## 2013-09-01 LAB — APTT: aPTT: 28 seconds (ref 24–37)

## 2013-09-01 LAB — PROTIME-INR
INR: 1.6 — ABNORMAL HIGH (ref 0.00–1.49)
Prothrombin Time: 18.6 seconds — ABNORMAL HIGH (ref 11.6–15.2)

## 2013-09-01 NOTE — ED Provider Notes (Signed)
  Face-to-face evaluation   History: She is here for evaluation of left leg pain and headache. The left leg pain, felt like a cramp that lasted longer than usual. The headache was transient, lasting only a few minutes. She has not had swelling in her legs. Her recent Coumadin level, was sub- therapeutic.  Physical exam: Alert, elderly, female in no apparent distress. Heart regular rate and rhythm. No murmur. Lungs clear. Left Leg NTTP, no swelling. Neck supple.   Medical screening examination/treatment/procedure(s) were conducted as a shared visit with non-physician practitioner(s) and myself.  I personally evaluated the patient during the encounter  Richarda Blade, MD 09/02/13 [id number]

## 2013-09-01 NOTE — ED Notes (Addendum)
Pt states that she began having left leg pain that started today. Pt has hx of left leg pain. Pt is on coumadin for blood clots. Pt states that the pain is constant aching. No redness, heat or obvious injury to leg. Pt states that pain is from calf to upper leg. No known injury to leg.

## 2013-09-01 NOTE — Progress Notes (Signed)
VASCULAR LAB PRELIMINARY  PRELIMINARY  PRELIMINARY  PRELIMINARY  Left lower extremity venous duplex completed.    Preliminary report:  Left:  No evidence of DVT, superficial thrombosis, or Baker's cyst.  Dmari Schubring, RVT 09/01/2013, 5:30 PM

## 2013-09-01 NOTE — ED Provider Notes (Signed)
CSN: [id number]     Arrival date & time 09/01/13  1452 History  This chart was scribed for non-physician practitioner, Abigail Butts, PA-C working with Richarda Blade, MD by Frederich Balding, ED scribe. This patient was seen in room TR10C/TR10C and the patient's care was started at 4:11 PM.   Chief Complaint  Patient presents with  . Leg Pain   The history is provided by the patient. No language interpreter was used.   HPI Comments: Andrea Simmons is a 72 y.o. female with history of DVT and pulmonary embolism who presents to the Emergency Department complaining of gradual onset, aching, cramping left leg pain that started this morning. She states the pain starts in her calf and radiates up her leg. Denies injury to leg or fall. States she has a history of the same and is currently taking coumadin for past DVT and PE. Pt's last INR was about 2 weeks ago and states it was low so her coumadin was increased. She took some vinegar to ease the cramping in her leg but states it provided no relief. States she is also having sudden onset, intermittent headaches and pressure in her head that is not like her normal headaches. She states it lasts for about 20 minutes when they come. Denies current headache. States she has noticed mild swelling in her left ankle. Pt states she is having lower back pain but states it is normal and no worse than usual. Denies visual disturbance, slurred speech, weakness, chest pain, SOB.   Past Medical History  Diagnosis Date  . Malignant neoplasm of breast (female), unspecified site 1993    L breast s/p mastectomy and tamoxifen x 26yrs  . Hyperlipidemia   . Hypertension   . Diabetes mellitus type II   . 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 GERD (gastroesophageal reflux disease)   . Anemia   . Fibromyalgia   . Unspecified hypothyroidism   . Anxiety   . Depression    Past Surgical History  Procedure Laterality Date   . Appendectomy    . Tubal ligation    . Abdominal hysterectomy    . Breast surgery  1993    Left breast surgery/biopsy then reconstruction  . Spine surgery      Spinal Fusion x 2  . Eye surgery      Cataract  . Rotator cuff repair     Family History  Problem Relation Age of Onset  . Cancer Other     Breast  . Depression Other     Parent  . Arthritis Other     Parent, Grandparent  . Hypertension Other     Grandparent  . Hyperlipidemia Other   . Miscarriages / Stillbirths Other     Grandparent   History  Substance Use Topics  . Smoking status: Never Smoker   . Smokeless tobacco: Not on file  . Alcohol Use: No   OB History   Grav Para Term Preterm Abortions TAB SAB Ect Mult Living                 Review of Systems  Eyes: Negative for visual disturbance.  Respiratory: Negative for shortness of breath.   Cardiovascular: Negative for chest pain.  Musculoskeletal: Positive for joint swelling and myalgias. Negative for back pain.  Neurological: Positive for headaches. Negative for speech difficulty and weakness.  All other systems reviewed and are negative.  Allergies  Anaprox;  Meperidine hcl; and Morphine  Home Medications   Prior to Admission medications   Medication Sig Start Date End Date Taking? Authorizing Provider  aspirin 81 MG tablet Take 81 mg by mouth daily.      Historical Provider, MD  calcium carbonate (OS-CAL) 600 MG TABS Take 1,200 mg by mouth daily.      Historical Provider, MD  celecoxib (CELEBREX) 200 MG capsule TAKE ONE CAPSULE BY MOUTH DAILY    Rowe Clack, MD  Choline Fenofibrate 135 MG capsule Take 135 mg by mouth daily.      Historical Provider, MD  diclofenac sodium (VOLTAREN) 1 % GEL Apply topically 4 (four) times daily as needed.    Historical Provider, MD  DULoxetine (CYMBALTA) 30 MG capsule Take 60 mg prior to dinner and 60 mg prior to bedtime. 02/08/11   Rowe Clack, MD  gabapentin (NEURONTIN) 300 MG capsule Take 1 capsule  (300 mg total) by mouth at bedtime. 09/18/12   Rowe Clack, MD  HYDROcodone-acetaminophen (LORCET) 10-650 MG per tablet Take 1 tablet by mouth as needed.     Historical Provider, MD  levothyroxine (SYNTHROID, LEVOTHROID) 50 MCG tablet Take 50 mcg by mouth daily before breakfast.    Historical Provider, MD  LORazepam (ATIVAN) 0.5 MG tablet Take 0.5 mg by mouth at bedtime.      Historical Provider, MD  metFORMIN (GLUCOPHAGE) 500 MG tablet Take 1,000 mg by mouth 2 (two) times daily.      Historical Provider, MD  Multiple Vitamin (MULTIVITAMIN) tablet Take 1 tablet by mouth daily.      Historical Provider, MD  Omega-3 Fatty Acids (FISH OIL) 500 MG CAPS Take 1 capsule by mouth 2 (two) times daily.     Historical Provider, MD  omeprazole (PRILOSEC) 20 MG capsule TAKE ONE CAPSULE BY MOUTH EVERY DAY 08/25/13   Rowe Clack, MD  ONE TOUCH ULTRA TEST test strip Use as directed 10/26/10   Historical Provider, MD  Jonetta Speak LANCETS MISC Use as directed 10/26/10   Historical Provider, MD  rosuvastatin (CRESTOR) 20 MG tablet Take 10 mg by mouth daily.     Historical Provider, MD  valsartan-hydrochlorothiazide (DIOVAN-HCT) 320-25 MG per tablet Take 1 tablet by mouth daily. 09/18/12   Rowe Clack, MD  warfarin (COUMADIN) 5 MG tablet Take as directed by coumadin clinic. 07/15/13   Rowe Clack, MD   BP 165/90  Pulse 62  Temp(Src) 98.4 F (36.9 C) (Oral)  Resp 16  SpO2 100%  Physical Exam  Nursing note and vitals reviewed. Constitutional: She is oriented to person, place, and time. She appears well-developed and well-nourished. No distress.  HENT:  Head: Normocephalic and atraumatic.  Mouth/Throat: Oropharynx is clear and moist.  Eyes: Conjunctivae and EOM are normal. Pupils are equal, round, and reactive to light. No scleral icterus.  No horizontal, vertical or rotational nystagmus  Neck: Normal range of motion. Neck supple.  Full active and passive ROM without pain No midline or  paraspinal tenderness No nuchal rigidity or meningeal signs  Cardiovascular: Normal rate, regular rhythm and intact distal pulses.   Pulmonary/Chest: Effort normal and breath sounds normal. No respiratory distress. She has no wheezes. She has no rales.  Abdominal: Soft. Bowel sounds are normal. There is no tenderness. There is no rebound and no guarding.  Musculoskeletal: Normal range of motion. She exhibits no edema.  No peripheral edema of bilateral lower extremities. Pain to palpation of left calf, left popliteal space and left  posterior thigh. No palpation cord. Positive Homan's sign.   Lymphadenopathy:    She has no cervical adenopathy.  Neurological: She is alert and oriented to person, place, and time. She has normal reflexes. No cranial nerve deficit. She exhibits normal muscle tone. Coordination normal.  Mental Status:  Alert, oriented, thought content appropriate. Speech fluent without evidence of aphasia. Able to follow 2 step commands without difficulty.  Cranial Nerves:  II:  Peripheral visual fields grossly normal, pupils equal, round, reactive to light III,IV, VI: ptosis not present, extra-ocular motions intact bilaterally V,VII: smile symmetric, facial light touch sensation equal VIII: hearing grossly normal bilaterally  IX,X: gag reflex present  XI: bilateral shoulder shrug equal and strong XII: midline tongue extension  Motor:  5/5 in upper and lower extremities bilaterally including strong and equal grip strength and dorsiflexion/plantar flexion Sensory: Pinprick and light touch normal in all extremities.  Deep Tendon Reflexes: 2+ and symmetric  Cerebellar: normal finger-to-nose with bilateral upper extremities Gait: normal gait and balance CV: distal pulses palpable throughout   Skin: Skin is warm and dry. No rash noted. She is not diaphoretic.  Psychiatric: She has a normal mood and affect. Her behavior is normal. Judgment and thought content normal.    ED Course   Procedures (including critical care time)  DIAGNOSTIC STUDIES: Oxygen Saturation is 97% on RA, normal by my interpretation.    COORDINATION OF CARE: 4:19 PM-Discussed treatment plan which includes blood work, head CT and ultrasound with pt at bedside and pt agreed to plan.   4:30 PM-Dr. Eulis Foster evaluated pt. He is agreeable with plan to do blood work, head CT and ultrasound.   Labs Review Labs Reviewed  PROTIME-INR - Abnormal; Notable for the following:    Prothrombin Time 18.6 (*)    INR 1.60 (*)    All other components within normal limits  CBC - Abnormal; Notable for the following:    RBC 3.77 (*)    Hemoglobin 11.1 (*)    HCT 34.5 (*)    All other components within normal limits  BASIC METABOLIC PANEL - Abnormal; Notable for the following:    Glucose, Bld 119 (*)    GFR calc non Af Amer 84 (*)    All other components within normal limits  APTT    Imaging Review Ct Head Wo Contrast  09/01/2013   CLINICAL DATA:  Left lower extremity pain and weakness  EXAM: CT HEAD WITHOUT CONTRAST  TECHNIQUE: Contiguous axial images were obtained from the base of the skull through the vertex without intravenous contrast.  COMPARISON:  None.  FINDINGS: The ventricles are normal in size and configuration. There is no mass, hemorrhage, extra-axial fluid collection, or midline shift. There is slight small vessel disease in the centra semiovale bilaterally. Gray-white compartments otherwise appear normal. There is no demonstrable acute infarct. Bony calvarium appears intact. The mastoid air cells are clear.  IMPRESSION: Mild periventricular small vessel disease. No intracranial mass, hemorrhage, or acute appearing infarct.   Electronically Signed   By: Lowella Grip M.D.   On: 09/01/2013 17:07     EKG Interpretation None      MDM   Final diagnoses:  Long term (current) use of anticoagulants  Left leg pain  Headache   Andrea Simmons presents with left leg pain and hx of DVT and PE on  coumadin.  Patient with pain to palpation of the left calf positive Homans sign. No palpable cord or peripheral edema the left leg.  Patient  neurologically intact. No thunderclap headache. Doubt subarachnoid hemorrhage. Less likely for DVT due to patient's chronic anticoagulation. Will obtain lower extremity venous duplex and head CT. Will also check PT/INR and electrolytes.  CT head with small vessel disease but no mass hemorrhage or acute appearing infarct.  Electronically within normal limits. Mild anemia at 11.1 not far from patient's baseline. INR 1.6, low today.  Patient remains neurologically intact here in the emergency department. She has not had pain medication which she takes on regular basis at home for her leg pain.  Recommended that she increase her Coumadin dose tonight and contact her physician or Coumadin nurse in the morning to discuss further dose adjustments for the next several days.  I have personally reviewed patient's vitals, nursing note and any pertinent labs or imaging.  I performed an undressed physical exam.    At this time, it has been determined that no acute conditions requiring further emergency intervention. The patient/guardian have been advised of the diagnosis and plan. I reviewed all labs and imaging including any potential incidental findings. We have discussed signs and symptoms that warrant return to the ED, such as swelling of the leg, increase or change in her headaches, altered mental status or other concerning symptoms.  Patient/guardian has voiced understanding and agreed to follow-up with the PCP or specialist in 24 hours.  Vital signs are stable at discharge.   BP 165/90  Pulse 62  Temp(Src) 98.4 F (36.9 C) (Oral)  Resp 16  SpO2 100%   I personally performed the services described in this documentation, which was scribed in my presence. The recorded information has been reviewed and is accurate.  Abigail Butts, PA-C 09/01/13 1840

## 2013-09-01 NOTE — ED Notes (Signed)
Patient transported to CT 

## 2013-09-01 NOTE — Discharge Instructions (Signed)
1. Medications: home pain medications, usual home medications 2. Treatment: rest, drink plenty of fluids, double your coumadin dose tonight and contact your MD/RN in the morning to discuss further coumadin dosing 3. Follow Up: Please followup with your primary doctor in 24 hours for discussion of your diagnoses and further evaluation after today's visit; i   Arthralgia Your caregiver has diagnosed you as suffering from an arthralgia. Arthralgia means there is pain in a joint. This can come from many reasons including:  Bruising the joint which causes soreness (inflammation) in the joint.  Wear and tear on the joints which occur as we grow older (osteoarthritis).  Overusing the joint.  Various forms of arthritis.  Infections of the joint. Regardless of the cause of pain in your joint, most of these different pains respond to anti-inflammatory drugs and rest. The exception to this is when a joint is infected, and these cases are treated with antibiotics, if it is a bacterial infection. HOME CARE INSTRUCTIONS   Rest the injured area for as long as directed by your caregiver. Then slowly start using the joint as directed by your caregiver and as the pain allows. Crutches as directed may be useful if the ankles, knees or hips are involved. If the knee was splinted or casted, continue use and care as directed. If an stretchy or elastic wrapping bandage has been applied today, it should be removed and re-applied every 3 to 4 hours. It should not be applied tightly, but firmly enough to keep swelling down. Watch toes and feet for swelling, bluish discoloration, coldness, numbness or excessive pain. If any of these problems (symptoms) occur, remove the ace bandage and re-apply more loosely. If these symptoms persist, contact your caregiver or return to this location.  For the first 24 hours, keep the injured extremity elevated on pillows while lying down.  Apply ice for 15-20 minutes to the sore joint  every couple hours while awake for the first half day. Then 03-04 times per day for the first 48 hours. Put the ice in a plastic bag and place a towel between the bag of ice and your skin.  Wear any splinting, casting, elastic bandage applications, or slings as instructed.  Only take over-the-counter or prescription medicines for pain, discomfort, or fever as directed by your caregiver. Do not use aspirin immediately after the injury unless instructed by your physician. Aspirin can cause increased bleeding and bruising of the tissues.  If you were given crutches, continue to use them as instructed and do not resume weight bearing on the sore joint until instructed. Persistent pain and inability to use the sore joint as directed for more than 2 to 3 days are warning signs indicating that you should see a caregiver for a follow-up visit as soon as possible. Initially, a hairline fracture (break in bone) may not be evident on X-rays. Persistent pain and swelling indicate that further evaluation, non-weight bearing or use of the joint (use of crutches or slings as instructed), or further X-rays are indicated. X-rays may sometimes not show a small fracture until a week or 10 days later. Make a follow-up appointment with your own caregiver or one to whom we have referred you. A radiologist (specialist in reading X-rays) may read your X-rays. Make sure you know how you are to obtain your X-ray results. Do not assume everything is normal if you do not hear from [place]. SEEK MEDICAL CARE IF: Bruising, swelling, or pain increases. SEEK IMMEDIATE MEDICAL CARE IF:  Your fingers or toes are numb or blue.  The pain is not responding to medications and continues to stay the same or get worse.  The pain in your joint becomes severe.  You develop a fever over 102 F (38.9 C).  It becomes impossible to move or use the joint. MAKE SURE YOU:   Understand these instructions.  Will watch your condition.  Will get  help right away if you are not doing well or get worse. Document Released: 03/06/2005 Document Revised: 05/29/2011 Document Reviewed: 10/23/2007 [facility] Patient Information 2014 [place].

## 2013-09-01 NOTE — ED Notes (Signed)
Declined W/C at D/C and was escorted to lobby by RN. 

## 2013-09-02 ENCOUNTER — Ambulatory Visit (INDEPENDENT_AMBULATORY_CARE_PROVIDER_SITE_OTHER): Payer: Medicare Other | Admitting: General Practice

## 2013-09-02 DIAGNOSIS — I2699 Other pulmonary embolism without acute cor pulmonale: Secondary | ICD-10-CM

## 2013-09-02 DIAGNOSIS — Z5181 Encounter for therapeutic drug level monitoring: Secondary | ICD-10-CM

## 2013-09-02 NOTE — Progress Notes (Signed)
Pre visit review using our clinic review tool, if applicable. No additional management support is needed unless otherwise documented below in the visit note. 

## 2013-09-10 ENCOUNTER — Ambulatory Visit (INDEPENDENT_AMBULATORY_CARE_PROVIDER_SITE_OTHER): Payer: Medicare Other | Admitting: General Practice

## 2013-09-10 DIAGNOSIS — Z5181 Encounter for therapeutic drug level monitoring: Secondary | ICD-10-CM

## 2013-09-10 DIAGNOSIS — Z7901 Long term (current) use of anticoagulants: Secondary | ICD-10-CM

## 2013-09-10 DIAGNOSIS — I2699 Other pulmonary embolism without acute cor pulmonale: Secondary | ICD-10-CM

## 2013-09-10 LAB — POCT INR: INR: 2.3

## 2013-09-10 NOTE — Progress Notes (Signed)
Pre visit review using our clinic review tool, if applicable. No additional management support is needed unless otherwise documented below in the visit note. 

## 2013-09-18 ENCOUNTER — Telehealth: Payer: Self-pay | Admitting: *Deleted

## 2013-09-18 DIAGNOSIS — E119 Type 2 diabetes mellitus without complications: Secondary | ICD-10-CM

## 2013-09-18 NOTE — Telephone Encounter (Signed)
Left message on machine for patient to come fasting to yearly exam.  Lipid, bmet, a1c ordered.

## 2013-09-22 ENCOUNTER — Encounter: Payer: Medicare Other | Admitting: Internal Medicine

## 2013-10-07 ENCOUNTER — Other Ambulatory Visit: Payer: Self-pay | Admitting: Internal Medicine

## 2013-10-08 ENCOUNTER — Ambulatory Visit (INDEPENDENT_AMBULATORY_CARE_PROVIDER_SITE_OTHER): Payer: Medicare Other | Admitting: General Practice

## 2013-10-08 DIAGNOSIS — I2699 Other pulmonary embolism without acute cor pulmonale: Secondary | ICD-10-CM

## 2013-10-08 DIAGNOSIS — Z5181 Encounter for therapeutic drug level monitoring: Secondary | ICD-10-CM

## 2013-10-08 DIAGNOSIS — Z7901 Long term (current) use of anticoagulants: Secondary | ICD-10-CM

## 2013-10-08 LAB — POCT INR: INR: 2.3

## 2013-10-08 NOTE — Progress Notes (Signed)
Pre visit review using our clinic review tool, if applicable. No additional management support is needed unless otherwise documented below in the visit note. 

## 2013-11-03 ENCOUNTER — Ambulatory Visit (INDEPENDENT_AMBULATORY_CARE_PROVIDER_SITE_OTHER): Payer: Medicare Other | Admitting: Internal Medicine

## 2013-11-03 ENCOUNTER — Other Ambulatory Visit (INDEPENDENT_AMBULATORY_CARE_PROVIDER_SITE_OTHER): Payer: Medicare Other

## 2013-11-03 ENCOUNTER — Encounter: Payer: Self-pay | Admitting: Internal Medicine

## 2013-11-03 VITALS — BP 150/78 | HR 53 | Temp 98.1°F | Ht 65.0 in | Wt 156.5 lb

## 2013-11-03 DIAGNOSIS — E119 Type 2 diabetes mellitus without complications: Secondary | ICD-10-CM

## 2013-11-03 DIAGNOSIS — IMO0001 Reserved for inherently not codable concepts without codable children: Secondary | ICD-10-CM

## 2013-11-03 DIAGNOSIS — Z0000 Encounter for general adult medical examination without abnormal findings: Secondary | ICD-10-CM

## 2013-11-03 DIAGNOSIS — E039 Hypothyroidism, unspecified: Secondary | ICD-10-CM

## 2013-11-03 LAB — CBC WITH DIFFERENTIAL/PLATELET
BASOS ABS: 0 10*3/uL (ref 0.0–0.1)
Basophils Relative: 0.4 % (ref 0.0–3.0)
EOS PCT: 1.4 % (ref 0.0–5.0)
Eosinophils Absolute: 0.1 10*3/uL (ref 0.0–0.7)
HCT: 34.2 % — ABNORMAL LOW (ref 36.0–46.0)
HEMOGLOBIN: 11.3 g/dL — AB (ref 12.0–15.0)
LYMPHS ABS: 2 10*3/uL (ref 0.7–4.0)
LYMPHS PCT: 35.8 % (ref 12.0–46.0)
MCHC: 33.1 g/dL (ref 30.0–36.0)
MCV: 88.6 fl (ref 78.0–100.0)
MONOS PCT: 8.7 % (ref 3.0–12.0)
Monocytes Absolute: 0.5 10*3/uL (ref 0.1–1.0)
NEUTROS ABS: 3 10*3/uL (ref 1.4–7.7)
Neutrophils Relative %: 53.7 % (ref 43.0–77.0)
Platelets: 196 10*3/uL (ref 150.0–400.0)
RBC: 3.86 Mil/uL — ABNORMAL LOW (ref 3.87–5.11)
RDW: 14.1 % (ref 11.5–15.5)
WBC: 5.5 10*3/uL (ref 4.0–10.5)

## 2013-11-03 LAB — HEPATIC FUNCTION PANEL
ALK PHOS: 42 U/L (ref 39–117)
ALT: 20 U/L (ref 0–35)
AST: 35 U/L (ref 0–37)
Albumin: 3.9 g/dL (ref 3.5–5.2)
Bilirubin, Direct: 0.1 mg/dL (ref 0.0–0.3)
Total Bilirubin: 0.6 mg/dL (ref 0.2–1.2)
Total Protein: 7.1 g/dL (ref 6.0–8.3)

## 2013-11-03 LAB — MICROALBUMIN / CREATININE URINE RATIO
CREATININE, U: 349.8 mg/dL
Microalb Creat Ratio: 0.6 mg/g (ref 0.0–30.0)
Microalb, Ur: 2 mg/dL — ABNORMAL HIGH (ref 0.0–1.9)

## 2013-11-03 LAB — LIPID PANEL
CHOLESTEROL: 147 mg/dL (ref 0–200)
HDL: 25.9 mg/dL — ABNORMAL LOW (ref >39.00)
LDL Cholesterol: 88 mg/dL (ref 0–99)
NONHDL: 121.1
Total CHOL/HDL Ratio: 6
Triglycerides: 166 mg/dL — ABNORMAL HIGH (ref 0.0–149.0)
VLDL: 33.2 mg/dL (ref 0.0–40.0)

## 2013-11-03 LAB — BASIC METABOLIC PANEL
BUN: 23 mg/dL (ref 6–23)
CO2: 27 mEq/L (ref 19–32)
Calcium: 9.5 mg/dL (ref 8.4–10.5)
Chloride: 106 mEq/L (ref 96–112)
Creatinine, Ser: 0.9 mg/dL (ref 0.4–1.2)
GFR: 63.07 mL/min (ref >60.00)
GLUCOSE: 101 mg/dL — AB (ref 70–99)
POTASSIUM: 4.5 meq/L (ref 3.5–5.1)
SODIUM: 143 meq/L (ref 135–145)

## 2013-11-03 LAB — HEMOGLOBIN A1C: HEMOGLOBIN A1C: 6.9 % — AB (ref 4.6–6.5)

## 2013-11-03 LAB — TSH: TSH: 2.03 u[IU]/mL (ref 0.35–4.50)

## 2013-11-03 NOTE — Progress Notes (Signed)
Pre visit review using our clinic review tool, if applicable. No additional management support is needed unless otherwise documented below in the visit note. 

## 2013-11-03 NOTE — Progress Notes (Signed)
Subjective:    Patient ID: Andrea Simmons, female    DOB: 11/29/1940, 73 y.o.   MRN: [id number]  HPI  Here for medicare wellness  Diet: heart healthy  Physical activity: walking 3-4x/wk Depression/mood screen: negative Hearing: intact to whispered voice Visual acuity: grossly normal, performs annual eye exam  ADLs: capable Fall risk: none Home safety: good Cognitive evaluation: intact to orientation, naming, recall and repetition EOL planning: adv directives, full code/ I agree  I have personally reviewed and have noted 1. The patient's medical and social history 2. Their use of alcohol, tobacco or illicit drugs 3. Their current medications and supplements 4. The patient's functional ability including ADL's, fall risks, home safety risks and hearing or visual impairment. 5. Diet and physical activities 6. Evidence for depression or mood disorders  Also reviewed chronic medical issues and interval medical events  Past Medical History  Diagnosis Date  . Malignant neoplasm of breast (female), unspecified site 1993    L breast s/p mastectomy and tamoxifen x 27yrs  . Hyperlipidemia   . Hypertension   . Diabetes mellitus type II   . 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 GERD (gastroesophageal reflux disease)   . Anemia   . Fibromyalgia   . Unspecified hypothyroidism   . Anxiety   . Depression    Family History  Problem Relation Age of Onset  . Cancer Other     Breast  . Depression Other     Parent  . Arthritis Other     Parent, Grandparent  . Hypertension Other     Grandparent  . Hyperlipidemia Other   . Miscarriages / Stillbirths Other     Grandparent   History  Substance Use Topics  . Smoking status: Never Smoker   . Smokeless tobacco: Not on file  . Alcohol Use: No    Review of Systems  Constitutional: Negative for fatigue and unexpected weight change.  Respiratory: Negative for cough, shortness of  breath and wheezing.   Cardiovascular: Negative for chest pain, palpitations and leg swelling.  Gastrointestinal: Negative for nausea, abdominal pain and diarrhea.  Musculoskeletal:       Cramps upper leg - inner thigh and outer hip, intermittent 3x/wk - BLE, but never at same time -same symptoms chronic for years - improved with vinegar > mustard  Neurological: Negative for dizziness, weakness, light-headedness and headaches.  Psychiatric/Behavioral: Negative for dysphoric mood. The patient is not nervous/anxious.   All other systems reviewed and are negative.      Objective:   Physical Exam  BP 150/78  Pulse 53  Temp(Src) 98.1 F (36.7 C) (Oral)  Ht 5\' 5"  (1.651 m)  Wt 156 lb 8 oz (70.988 kg)  BMI 26.04 kg/m2  SpO2 96% Wt Readings from Last 3 Encounters:  11/03/13 156 lb 8 oz (70.988 kg)  07/25/13 159 lb (72.122 kg)  01/23/13 159 lb 3.2 oz (72.213 kg)   Constitutional: She appears well-developed and well-nourished. No distress.  HENT: Head: Normocephalic and atraumatic. Ears: B TMs ok, no erythema or effusion; Nose: Nose normal. Mouth/Throat: Oropharynx is clear and moist. No oropharyngeal exudate.  Eyes: Conjunctivae and EOM are normal. Pupils are equal, round, and reactive to light. No scleral icterus.  Neck: Normal range of motion. Neck supple. No JVD present. No thyromegaly present.  Cardiovascular: Normal rate, regular rhythm and normal heart sounds.  No murmur heard. No BLE edema. Pulmonary/Chest: Effort normal and  breath sounds normal. No respiratory distress. She has no wheezes.  Abdominal: Soft. Bowel sounds are normal. She exhibits no distension. There is no tenderness. no masses GU/breast: defer to gyn Musculoskeletal: Normal range of motion, no joint effusions. No gross deformities Neurological: She is alert and oriented to person, place, and time. No cranial nerve deficit. Coordination, balance, strength, speech and gait are normal.  Skin: Skin is warm and dry. No  rash noted. No erythema.  Psychiatric: She has a normal mood and affect. Her behavior is normal. Judgment and thought content normal.    Lab Results  Component Value Date   WBC 4.6 09/01/2013   HGB 11.1* 09/01/2013   HCT 34.5* 09/01/2013   PLT 210 09/01/2013   GLUCOSE 119* 09/01/2013   CHOL 142 08/22/2012   TRIG 162* 08/22/2012   HDL 32* 08/22/2012   LDLDIRECT 101.6 04/29/2010   LDLCALC 78 08/22/2012   ALT 23 08/22/2012   AST 40* 08/22/2012   NA 141 09/01/2013   K 4.3 09/01/2013   CL 104 09/01/2013   CREATININE 0.73 09/01/2013   BUN 21 09/01/2013   CO2 25 09/01/2013   TSH 2.70 08/22/2012   INR 2.3 10/08/2013   HGBA1C 6.6* 08/22/2012    Ct Head Wo Contrast  09/01/2013   CLINICAL DATA:  Left lower extremity pain and weakness  EXAM: CT HEAD WITHOUT CONTRAST  TECHNIQUE: Contiguous axial images were obtained from the base of the skull through the vertex without intravenous contrast.  COMPARISON:  None.  FINDINGS: The ventricles are normal in size and configuration. There is no mass, hemorrhage, extra-axial fluid collection, or midline shift. There is slight small vessel disease in the centra semiovale bilaterally. Gray-white compartments otherwise appear normal. There is no demonstrable acute infarct. Bony calvarium appears intact. The mastoid air cells are clear.  IMPRESSION: Mild periventricular small vessel disease. No intracranial mass, hemorrhage, or acute appearing infarct.   Electronically Signed   By: Lowella Grip M.D.   On: 09/01/2013 17:07       Assessment & Plan:   CPX/AWV/v70.0 - Today patient counseled on age appropriate routine health concerns for screening and prevention, each reviewed and up to date or declined. Immunizations reviewed and up to date or declined. Labs reviewed. Risk factors for depression reviewed and negative. Hearing function and visual acuity are intact. ADLs screened and addressed as needed. Functional ability and level of safety reviewed and appropriate. Education,  counseling and referrals performed based on assessed risks today. Patient provided with a copy of personalized plan for preventive services.  Problem List Items Addressed This Visit   DIABETES MELLITUS, TYPE II      Follows with endo for same - labs done with Dr. Chalmers Cater but will check here as no copy available Lab Results  Component Value Date   HGBA1C 6.6* 08/22/2012       Relevant Orders      Basic metabolic panel      Lipid panel      Hemoglobin A1c      Microalbumin / creatinine urine ratio      Ambulatory referral to Ophthalmology   FIBROMYALGIA     Follows with rheumatology for same On Cymbalta, Neurontin and pain medications as needed Interval history reviewed, advised to continue same    Relevant Orders      Basic metabolic panel      CBC with Differential      Hepatic function panel      TSH   HYPOTHYROIDISM  Monitors same with endo Chalmers Cater) Lab Results  Component Value Date   TSH 2.70 08/22/2012       Relevant Orders      TSH    Other Visit Diagnoses   Routine general medical examination at a health care facility    -  Primary

## 2013-11-03 NOTE — Assessment & Plan Note (Signed)
Monitors same with endo Chalmers Cater) Lab Results  Component Value Date   TSH 2.70 08/22/2012

## 2013-11-03 NOTE — Patient Instructions (Addendum)
It was good to see you today.  We have reviewed your prior records including labs and tests today  Health Maintenance reviewed - all recommended immunizations and age-appropriate screenings are up-to-date.  Test(s) ordered today. Your results will be released to [place] (or called to you) after review, usually within 72hours after test completion. If any changes need to be made, you will be notified at that same time.  Be sure you are at least taking 2000U Vit D each day  Medications reviewed and updated, no changes recommended at this time.  we'll make referral to new eye specialist. Our office will contact you regarding appointment(s) once made.  Please schedule followup in 12 months for annual exam and labs, call sooner if problems.  Health Maintenance Adopting a healthy lifestyle and getting preventive care can go a long way to promote health and wellness. Talk with your health care provider about what schedule of regular examinations is right for you. This is a good chance for you to check in with your provider about disease prevention and staying healthy. In between checkups, there are plenty of things you can do on your own. Experts have done a lot of research about which lifestyle changes and preventive measures are most likely to keep you healthy. Ask your health care provider for more information. WEIGHT AND DIET  Eat a healthy diet  Be sure to include plenty of vegetables, fruits, low-fat dairy products, and lean protein.  Do not eat a lot of foods high in solid fats, added sugars, or salt.  Get regular exercise. This is one of the most important things you can do for your health.  Most adults should exercise for at least 150 minutes each week. The exercise should increase your heart rate and make you sweat (moderate-intensity exercise).  Most adults should also do strengthening exercises at least twice a week. This is in addition to the moderate-intensity exercise.   Maintain a healthy weight  Body mass index (BMI) is a measurement that can be used to identify possible weight problems. It estimates body fat based on height and weight. Your health care provider can help determine your BMI and help you achieve or maintain a healthy weight.  For females 51 years of age and older:   A BMI below 18.5 is considered underweight.  A BMI of 18.5 to 24.9 is normal.  A BMI of 25 to 29.9 is considered overweight.  A BMI of 30 and above is considered obese.  Watch levels of cholesterol and blood lipids  You should start having your blood tested for lipids and cholesterol at 71 years of age, then have this test every 5 years.  You may need to have your cholesterol levels checked more often if:  Your lipid or cholesterol levels are high.  You are older than 71 years of age.  You are at high risk for heart disease.  CANCER SCREENING   Lung Cancer  Lung cancer screening is recommended for adults 22-44 years old who are at high risk for lung cancer because of a history of smoking.  A yearly low-dose CT scan of the lungs is recommended for people who:  Currently smoke.  Have quit within the past 15 years.  Have at least a 30-pack-year history of smoking. A pack year is smoking an average of one pack of cigarettes a day for 1 year.  Yearly screening should continue until it has been 15 years since you quit.  Yearly screening should stop if  you develop a health problem that would prevent you from having lung cancer treatment.  Breast Cancer  Practice breast self-awareness. This means understanding how your breasts normally appear and feel.  It also means doing regular breast self-exams. Let your health care provider know about any changes, no matter how small.  If you are in your 20s or 30s, you should have a clinical breast exam (CBE) by a health care provider every 1-3 years as part of a regular health exam.  If you are 72 or older, have a  CBE every year. Also consider having a breast X-ray (mammogram) every year.  If you have a family history of breast cancer, talk to your health care provider about genetic screening.  If you are at high risk for breast cancer, talk to your health care provider about having an MRI and a mammogram every year.  Breast cancer gene (BRCA) assessment is recommended for women who have family members with BRCA-related cancers. BRCA-related cancers include:  Breast.  Ovarian.  Tubal.  Peritoneal cancers.  Results of the assessment will determine the need for genetic counseling and BRCA1 and BRCA2 testing. Cervical Cancer Routine pelvic examinations to screen for cervical cancer are no longer recommended for nonpregnant women who are considered low risk for cancer of the pelvic organs (ovaries, uterus, and vagina) and who do not have symptoms. A pelvic examination may be necessary if you have symptoms including those associated with pelvic infections. Ask your health care provider if a screening pelvic exam is right for you.   The Pap test is the screening test for cervical cancer for women who are considered at risk.  If you had a hysterectomy for a problem that was not cancer or a condition that could lead to cancer, then you no longer need Pap tests.  If you are older than 65 years, and you have had normal Pap tests for the past 10 years, you no longer need to have Pap tests.  If you have had past treatment for cervical cancer or a condition that could lead to cancer, you need Pap tests and screening for cancer for at least 20 years after your treatment.  If you no longer get a Pap test, assess your risk factors if they change (such as having a new sexual partner). This can affect whether you should start being screened again.  Some women have medical problems that increase their chance of getting cervical cancer. If this is the case for you, your health care provider may recommend more  frequent screening and Pap tests.  The human papillomavirus (HPV) test is another test that may be used for cervical cancer screening. The HPV test looks for the virus that can cause cell changes in the cervix. The cells collected during the Pap test can be tested for HPV.  The HPV test can be used to screen women 43 years of age and older. Getting tested for HPV can extend the interval between normal Pap tests from three to five years.  An HPV test also should be used to screen women of any age who have unclear Pap test results.  After 71 years of age, women should have HPV testing as often as Pap tests.  Colorectal Cancer  This type of cancer can be detected and often prevented.  Routine colorectal cancer screening usually begins at 72 years of age and continues through 71 years of age.  Your health care provider may recommend screening at an earlier age if you  have risk factors for colon cancer.  Your health care provider may also recommend using home test kits to check for hidden blood in the stool.  A small camera at the end of a tube can be used to examine your colon directly (sigmoidoscopy or colonoscopy). This is done to check for the earliest forms of colorectal cancer.  Routine screening usually begins at age 31.  Direct examination of the colon should be repeated every 5-10 years through 71 years of age. However, you may need to be screened more often if early forms of precancerous polyps or small growths are found. Skin Cancer  Check your skin from head to toe regularly.  Tell your health care provider about any new moles or changes in moles, especially if there is a change in a mole's shape or color.  Also tell your health care provider if you have a mole that is larger than the size of a pencil eraser.  Always use sunscreen. Apply sunscreen liberally and repeatedly throughout the day.  Protect yourself by wearing long sleeves, pants, a wide-brimmed hat, and sunglasses  whenever you are outside. HEART DISEASE, DIABETES, AND HIGH BLOOD PRESSURE   Have your blood pressure checked at least every 1-2 years. High blood pressure causes heart disease and increases the risk of stroke.  If you are between 74 years and 47 years old, ask your health care provider if you should take aspirin to prevent strokes.  Have regular diabetes screenings. This involves taking a blood sample to check your fasting blood sugar level.  If you are at a normal weight and have a low risk for diabetes, have this test once every three years after 71 years of age.  If you are overweight and have a high risk for diabetes, consider being tested at a younger age or more often. PREVENTING INFECTION  Hepatitis B  If you have a higher risk for hepatitis B, you should be screened for this virus. You are considered at high risk for hepatitis B if:  You were born in a country where hepatitis B is common. Ask your health care provider which countries are considered high risk.  Your parents were born in a high-risk country, and you have not been immunized against hepatitis B (hepatitis B vaccine).  You have HIV or AIDS.  You use needles to inject street drugs.  You live with someone who has hepatitis B.  You have had sex with someone who has hepatitis B.  You get hemodialysis treatment.  You take certain medicines for conditions, including cancer, organ transplantation, and autoimmune conditions. Hepatitis C  Blood testing is recommended for:  Everyone born from [birth year] through 1963.  Anyone with known risk factors for hepatitis C. Sexually transmitted infections (STIs)  You should be screened for sexually transmitted infections (STIs) including gonorrhea and chlamydia if:  You are sexually active and are younger than 71 years of age.  You are older than 71 years of age and your health care provider tells you that you are at risk for this type of infection.  Your sexual activity  has changed since you were last screened and you are at an increased risk for chlamydia or gonorrhea. Ask your health care provider if you are at risk.  If you do not have HIV, but are at risk, it may be recommended that you take a prescription medicine daily to prevent HIV infection. This is called pre-exposure prophylaxis (PrEP). You are considered at risk if:  You are  sexually active and do not regularly use condoms or know the HIV status of your partner(s).  You take drugs by injection.  You are sexually active with a partner who has HIV. Talk with your health care provider about whether you are at high risk of being infected with HIV. If you choose to begin PrEP, you should first be tested for HIV. You should then be tested every 3 months for as long as you are taking PrEP.  PREGNANCY   If you are premenopausal and you may become pregnant, ask your health care provider about preconception counseling.  If you may become pregnant, take 400 to 800 micrograms (mcg) of folic acid every day.  If you want to prevent pregnancy, talk to your health care provider about birth control (contraception). OSTEOPOROSIS AND MENOPAUSE   Osteoporosis is a disease in which the bones lose minerals and strength with aging. This can result in serious bone fractures. Your risk for osteoporosis can be identified using a bone density scan.  If you are 41 years of age or older, or if you are at risk for osteoporosis and fractures, ask your health care provider if you should be screened.  Ask your health care provider whether you should take a calcium or vitamin D supplement to lower your risk for osteoporosis.  Menopause may have certain physical symptoms and risks.  Hormone replacement therapy may reduce some of these symptoms and risks. Talk to your health care provider about whether hormone replacement therapy is right for you.  HOME CARE INSTRUCTIONS   Schedule regular health, dental, and eye  exams.  Stay current with your immunizations.   Do not use any tobacco products including cigarettes, chewing tobacco, or electronic cigarettes.  If you are pregnant, do not drink alcohol.  If you are breastfeeding, limit how much and how often you drink alcohol.  Limit alcohol intake to no more than 1 drink per day for nonpregnant women. One drink equals 12 ounces of beer, 5 ounces of wine, or 1 ounces of hard liquor.  Do not use street drugs.  Do not share needles.  Ask your health care provider for help if you need support or information about quitting drugs.  Tell your health care provider if you often feel depressed.  Tell your health care provider if you have ever been abused or do not feel safe at home. Document Released: 09/19/2010 Document Revised: 07/21/2013 Document Reviewed: 02/05/2013 [facility] Patient Information 2015 [place], [place]. This information is not intended to replace advice given to you by your health care provider. Make sure you discuss any questions you have with your health care provider.

## 2013-11-03 NOTE — Assessment & Plan Note (Signed)
Follows with rheumatology for same On Cymbalta, Neurontin and pain medications as needed Interval history reviewed, advised to continue same

## 2013-11-03 NOTE — Assessment & Plan Note (Signed)
Follows with endo for same - labs done with Dr. Chalmers Cater but will check here as no copy available Lab Results  Component Value Date   HGBA1C 6.6* 08/22/2012

## 2013-11-06 ENCOUNTER — Other Ambulatory Visit: Payer: Self-pay | Admitting: Internal Medicine

## 2013-11-11 ENCOUNTER — Telehealth: Payer: Self-pay

## 2013-11-11 NOTE — Telephone Encounter (Signed)
Pt called for lab results 

## 2013-11-19 ENCOUNTER — Ambulatory Visit (INDEPENDENT_AMBULATORY_CARE_PROVIDER_SITE_OTHER): Payer: Medicare Other | Admitting: *Deleted

## 2013-11-19 DIAGNOSIS — I2699 Other pulmonary embolism without acute cor pulmonale: Secondary | ICD-10-CM

## 2013-11-19 DIAGNOSIS — Z5181 Encounter for therapeutic drug level monitoring: Secondary | ICD-10-CM

## 2013-11-19 DIAGNOSIS — Z7901 Long term (current) use of anticoagulants: Secondary | ICD-10-CM

## 2013-11-19 LAB — POCT INR: INR: 1.8

## 2013-11-20 ENCOUNTER — Ambulatory Visit: Payer: Medicare Other | Admitting: Cardiology

## 2013-11-24 ENCOUNTER — Other Ambulatory Visit: Payer: Self-pay | Admitting: Internal Medicine

## 2013-11-25 ENCOUNTER — Other Ambulatory Visit: Payer: Self-pay | Admitting: Internal Medicine

## 2013-11-25 ENCOUNTER — Encounter: Payer: Self-pay | Admitting: Internal Medicine

## 2013-11-25 ENCOUNTER — Other Ambulatory Visit: Payer: Self-pay | Admitting: Gynecology

## 2013-11-25 ENCOUNTER — Telehealth: Payer: Self-pay | Admitting: Internal Medicine

## 2013-11-25 DIAGNOSIS — Z9889 Other specified postprocedural states: Secondary | ICD-10-CM

## 2013-11-25 DIAGNOSIS — M8580 Other specified disorders of bone density and structure, unspecified site: Secondary | ICD-10-CM

## 2013-11-25 DIAGNOSIS — R921 Mammographic calcification found on diagnostic imaging of breast: Secondary | ICD-10-CM

## 2013-11-25 DIAGNOSIS — Z853 Personal history of malignant neoplasm of breast: Secondary | ICD-10-CM

## 2013-11-25 NOTE — Telephone Encounter (Signed)
Pt called wondering if it is time to do her bone density test again. Pt stated that she had one done in 2010 and last ov she spoke with Dr. Asa Lente about this. Please advise.

## 2013-11-28 NOTE — Telephone Encounter (Signed)
DEXA ordered for [facility] - ok to schedule with pt

## 2013-12-01 LAB — HM DEXA SCAN

## 2013-12-10 ENCOUNTER — Ambulatory Visit (INDEPENDENT_AMBULATORY_CARE_PROVIDER_SITE_OTHER): Payer: Medicare Other | Admitting: *Deleted

## 2013-12-10 DIAGNOSIS — I2699 Other pulmonary embolism without acute cor pulmonale: Secondary | ICD-10-CM

## 2013-12-10 DIAGNOSIS — Z7901 Long term (current) use of anticoagulants: Secondary | ICD-10-CM

## 2013-12-10 DIAGNOSIS — Z5181 Encounter for therapeutic drug level monitoring: Secondary | ICD-10-CM

## 2013-12-10 LAB — POCT INR: INR: 2.5

## 2013-12-18 ENCOUNTER — Encounter: Payer: Self-pay | Admitting: Internal Medicine

## 2013-12-24 ENCOUNTER — Ambulatory Visit
Admission: RE | Admit: 2013-12-24 | Discharge: 2013-12-24 | Disposition: A | Discharge status: Home / Self Care | Payer: Medicare Other | Source: Ambulatory Visit | Attending: Internal Medicine | Admitting: Internal Medicine

## 2013-12-24 DIAGNOSIS — Z853 Personal history of malignant neoplasm of breast: Secondary | ICD-10-CM

## 2013-12-24 DIAGNOSIS — Z9889 Other specified postprocedural states: Secondary | ICD-10-CM

## 2013-12-24 DIAGNOSIS — R921 Mammographic calcification found on diagnostic imaging of breast: Secondary | ICD-10-CM

## 2013-12-24 NOTE — Telephone Encounter (Signed)
dexa scheduled.

## 2013-12-25 ENCOUNTER — Encounter: Payer: Self-pay | Admitting: Cardiology

## 2013-12-25 ENCOUNTER — Ambulatory Visit (INDEPENDENT_AMBULATORY_CARE_PROVIDER_SITE_OTHER): Payer: Medicare Other | Admitting: Cardiology

## 2013-12-25 VITALS — BP 122/72 | HR 73 | Ht 65.0 in | Wt 157.0 lb

## 2013-12-25 DIAGNOSIS — E785 Hyperlipidemia, unspecified: Secondary | ICD-10-CM

## 2013-12-25 NOTE — Patient Instructions (Signed)
Your physician recommends that you continue on your current medications as directed. Please refer to the Current Medication list given to you today.   Your physician wants you to follow-up in: ONE YEAR WITH DR NELSON You will receive a reminder letter in the mail two months in advance. If you don't receive a letter, please call our office to schedule the follow-up appointment.  

## 2013-12-25 NOTE — Progress Notes (Signed)
Patient ID: Andrea Simmons, female   DOB: 01-06-1941, 73 y.o.   MRN: [id number]     Patient Name: Andrea Simmons Date of Encounter: 12/25/2013  Primary Care Provider:  Gwendolyn Grant, MD Primary Cardiologist:  Dorothy Spark  Problem List   Past Medical History  Diagnosis Date  . Malignant neoplasm of breast (female), unspecified site 1993    L breast s/p mastectomy and tamoxifen x 7yrs  . Hyperlipidemia   . Hypertension   . Diabetes mellitus type II   . 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 GERD (gastroesophageal reflux disease)   . Anemia   . Fibromyalgia   . Unspecified hypothyroidism   . Anxiety   . Depression    Past Surgical History  Procedure Laterality Date  . Appendectomy    . Tubal ligation    . Abdominal hysterectomy    . Breast surgery  1993    Left breast surgery/biopsy then reconstruction  . Spine surgery      Spinal Fusion x 2  . Eye surgery      Cataract  . Rotator cuff repair      Allergies  Allergies  Allergen Reactions  . Anaprox [Naproxen Sodium]   . Meperidine Hcl   . Morphine     HPI  Andrea Simmons turns today for evaluation and management of her history of pulmonary embolus on 2 different occasions and lifelong Coumadin. She also is hyperlipidemia, hypertension, type 2 diabetes. She is followed in primary care.  She's very compliant. She's had no recurrent symptoms of pulmonary embolus. She denies any lower extremity edema or pain. She denies any bleeding or melena. Her last hemoglobin A1c was below 7%. She remains very active. She denies chest pain, palpitations or syncope, she is complaint with her meds.  Home Medications  Prior to Admission medications   Medication Sig Start Date End Date Taking? Authorizing Provider  aspirin 81 MG tablet Take 81 mg by mouth daily.     Yes Historical Provider, MD  calcium carbonate (OS-CAL) 600 MG TABS Take 1,200 mg by mouth daily.     Yes  Historical Provider, MD  celecoxib (CELEBREX) 200 MG capsule TAKE ONE CAPSULE BY MOUTH DAILY 11/25/13  Yes Rowe Clack, MD  Choline Fenofibrate 135 MG capsule Take 135 mg by mouth daily.     Yes Historical Provider, MD  diclofenac sodium (VOLTAREN) 1 % GEL Apply topically 4 (four) times daily as needed.   Yes Historical Provider, MD  DULoxetine (CYMBALTA) 30 MG capsule Take 60 mg prior to dinner and 60 mg prior to bedtime. 02/08/11  Yes Rowe Clack, MD  gabapentin (NEURONTIN) 300 MG capsule TAKE ONE CAPSULE BY MOUTH AT BEDTIME 11/06/13  Yes Rowe Clack, MD  HYDROcodone-acetaminophen (LORCET) 10-650 MG per tablet Take 1 tablet by mouth as needed.    Yes Historical Provider, MD  levothyroxine (SYNTHROID, LEVOTHROID) 50 MCG tablet Take 50 mcg by mouth daily before breakfast.   Yes Historical Provider, MD  metFORMIN (GLUCOPHAGE) 500 MG tablet Take 1,000 mg by mouth 2 (two) times daily.     Yes Historical Provider, MD  Multiple Vitamin (MULTIVITAMIN) tablet Take 1 tablet by mouth daily.     Yes Historical Provider, MD  Omega-3 Fatty Acids (FISH OIL) 500 MG CAPS Take 1 capsule by mouth 2 (two) times daily.    Yes Historical Provider, MD  omeprazole (PRILOSEC) 20 MG capsule  TAKE ONE CAPSULE BY MOUTH EVERY DAY 11/25/13  Yes Rowe Clack, MD  ONE TOUCH ULTRA TEST test strip Use as directed 10/26/10  Yes Historical Provider, MD  Jonetta Speak LANCETS MISC Use as directed 10/26/10  Yes Historical Provider, MD  rosuvastatin (CRESTOR) 20 MG tablet Take 10 mg by mouth daily.    Yes Historical Provider, MD  valsartan-hydrochlorothiazide (DIOVAN-HCT) 320-25 MG per tablet TAKE 1 TABLET BY MOUTH DAILY.   Yes Rowe Clack, MD  warfarin (COUMADIN) 5 MG tablet Take as directed by coumadin clinic. 07/15/13  Yes Rowe Clack, MD    Family History  Family History  Problem Relation Age of Onset  . Cancer Other     Breast  . Depression Other     Parent  . Arthritis Other     Parent,  Grandparent  . Hypertension Other     Grandparent  . Hyperlipidemia Other   . Miscarriages / [place] Other     Grandparent    Social History  History   Social History  . Marital Status: Married    Spouse Name: N/A    Number of Children: N/A  . Years of Education: N/A   Occupational History  . teacher's assistant   . hair dresser   . school bus driver    Social History Main Topics  . Smoking status: Never Smoker   . Smokeless tobacco: Not on file  . Alcohol Use: No  . Drug Use: No  . Sexual Activity:    Other Topics Concern  . Not on file   Social History Narrative  . No narrative on file     Review of Systems, as per HPI, otherwise negative General:  No chills, fever, night sweats or weight changes.  Cardiovascular:  No chest pain, dyspnea on exertion, edema, orthopnea, palpitations, paroxysmal nocturnal dyspnea. Dermatological: No rash, lesions/masses Respiratory: No cough, dyspnea Urologic: No hematuria, dysuria Abdominal:   No nausea, vomiting, diarrhea, bright red blood per rectum, melena, or hematemesis Neurologic:  No visual changes, wkns, changes in mental status. All other systems reviewed and are otherwise negative except as noted above.  Physical Exam 122/72  HR 73 Height 5\' 5"  (1.651 m).  General: Pleasant, NAD Psych: Normal affect. Neuro: Alert and oriented X 3. Moves all extremities spontaneously. HEENT: Normal  Neck: Supple without bruits or JVD. Lungs:  Resp regular and unlabored, CTA. Heart: RRR no s3, s4, or murmurs. Abdomen: Soft, non-tender, non-distended, BS + x 4.  Extremities: No clubbing, cyanosis or edema. DP/PT/Radials 2+ and equal bilaterally.  Labs:  No results found for this basename: CKTOTAL, CKMB, TROPONINI,  in the last 72 hours Lab Results  Component Value Date   WBC 5.5 11/03/2013   HGB 11.3* 11/03/2013   HCT 34.2* 11/03/2013   MCV 88.6 11/03/2013   PLT 196.0 11/03/2013    Lab Results  Component Value Date    DDIMER  Value: 0.26        AT THE INHOUSE ESTABLISHED CUTOFF VALUE OF 0.48 ug/mL FEU, THIS ASSAY HAS BEEN DOCUMENTED IN THE LITERATURE TO HAVE 05/17/2007   No components found with this basename: POCBNP,     Component Value Date/Time   NA 143 11/03/2013 1119   K 4.5 11/03/2013 1119   CL 106 11/03/2013 1119   CO2 27 11/03/2013 1119   GLUCOSE 101* 11/03/2013 1119   BUN 23 11/03/2013 1119   CREATININE 0.9 11/03/2013 1119   CALCIUM 9.5 11/03/2013 1119   PROT 7.1  11/03/2013 1119   ALBUMIN 3.9 11/03/2013 1119   AST 35 11/03/2013 1119   ALT 20 11/03/2013 1119   ALKPHOS 42 11/03/2013 1119   BILITOT 0.6 11/03/2013 1119   GFRNONAA 84* 09/01/2013 1638   GFRAA >90 09/01/2013 1638   Lab Results  Component Value Date   CHOL 147 11/03/2013   HDL 25.90* 11/03/2013   LDLCALC 88 11/03/2013   TRIG 166.0* 11/03/2013    Accessory Clinical Findings  Echocardiogram - none  ECG - SR, LAD, RBBB, unchanged from 2013   Assessment & Plan  73 year old female  1. 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 HTN - controlled   3. Hyperlipidemia - on rosuvastatin, LFTs ok in August   Follow up in 1 year.   Dorothy Spark, MD, [facility] 12/25/2013, 3:33 PM

## 2013-12-26 DIAGNOSIS — E785 Hyperlipidemia, unspecified: Secondary | ICD-10-CM | POA: Insufficient documentation

## 2013-12-31 ENCOUNTER — Encounter: Payer: Self-pay | Admitting: Internal Medicine

## 2014-01-06 ENCOUNTER — Other Ambulatory Visit: Payer: Self-pay

## 2014-01-06 MED ORDER — VALSARTAN-HYDROCHLOROTHIAZIDE 320-25 MG PO TABS: ORAL_TABLET | ORAL | Status: DC

## 2014-01-21 ENCOUNTER — Ambulatory Visit (INDEPENDENT_AMBULATORY_CARE_PROVIDER_SITE_OTHER): Payer: Medicare Other | Admitting: Family

## 2014-01-21 DIAGNOSIS — Z5181 Encounter for therapeutic drug level monitoring: Secondary | ICD-10-CM

## 2014-01-21 LAB — POCT INR: INR: 2.4

## 2014-01-21 NOTE — Patient Instructions (Signed)
Continue to take 1/2 tablet daily except take 1 tablet on Wednesdays. Recheck in 6 weeks.  Anticoagulation Dose Instructions as of 01/21/2014      Andrea Simmons Tue Wed Thu Fri Sat   New Dose 2.5 mg 2.5 mg 2.5 mg 5 mg 2.5 mg 2.5 mg 2.5 mg    Description        Continue to take 1/2 tablet daily except take 1 tablet on Wednesdays. Recheck in 6 weeks.

## 2014-02-19 ENCOUNTER — Other Ambulatory Visit: Payer: Self-pay | Admitting: Internal Medicine

## 2014-03-01 ENCOUNTER — Encounter (HOSPITAL_COMMUNITY): Payer: Self-pay | Admitting: Emergency Medicine

## 2014-03-01 ENCOUNTER — Emergency Department (HOSPITAL_COMMUNITY): Payer: Medicare Other

## 2014-03-01 ENCOUNTER — Emergency Department (HOSPITAL_COMMUNITY)
Admission: EM | Admit: 2014-03-01 | Discharge: 2014-03-01 | Disposition: A | Discharge status: Home / Self Care | Payer: Medicare Other | Attending: Emergency Medicine | Admitting: Emergency Medicine

## 2014-03-01 DIAGNOSIS — Y9289 Other specified places as the place of occurrence of the external cause: Secondary | ICD-10-CM | POA: Diagnosis not present

## 2014-03-01 DIAGNOSIS — E119 Type 2 diabetes mellitus without complications: Secondary | ICD-10-CM | POA: Insufficient documentation

## 2014-03-01 DIAGNOSIS — Z79899 Other long term (current) drug therapy: Secondary | ICD-10-CM | POA: Diagnosis not present

## 2014-03-01 DIAGNOSIS — Z7901 Long term (current) use of anticoagulants: Secondary | ICD-10-CM | POA: Insufficient documentation

## 2014-03-01 DIAGNOSIS — F329 Major depressive disorder, single episode, unspecified: Secondary | ICD-10-CM | POA: Diagnosis not present

## 2014-03-01 DIAGNOSIS — E785 Hyperlipidemia, unspecified: Secondary | ICD-10-CM | POA: Diagnosis not present

## 2014-03-01 DIAGNOSIS — Z853 Personal history of malignant neoplasm of breast: Secondary | ICD-10-CM | POA: Insufficient documentation

## 2014-03-01 DIAGNOSIS — F419 Anxiety disorder, unspecified: Secondary | ICD-10-CM | POA: Insufficient documentation

## 2014-03-01 DIAGNOSIS — Z86711 Personal history of pulmonary embolism: Secondary | ICD-10-CM | POA: Diagnosis not present

## 2014-03-01 DIAGNOSIS — Z862 Personal history of diseases of the blood and blood-forming organs and certain disorders involving the immune mechanism: Secondary | ICD-10-CM | POA: Diagnosis not present

## 2014-03-01 DIAGNOSIS — Y9389 Activity, other specified: Secondary | ICD-10-CM | POA: Diagnosis not present

## 2014-03-01 DIAGNOSIS — S161XXA Strain of muscle, fascia and tendon at neck level, initial encounter: Secondary | ICD-10-CM

## 2014-03-01 DIAGNOSIS — K219 Gastro-esophageal reflux disease without esophagitis: Secondary | ICD-10-CM | POA: Diagnosis not present

## 2014-03-01 DIAGNOSIS — Z7982 Long term (current) use of aspirin: Secondary | ICD-10-CM | POA: Diagnosis not present

## 2014-03-01 DIAGNOSIS — Z791 Long term (current) use of non-steroidal anti-inflammatories (NSAID): Secondary | ICD-10-CM | POA: Insufficient documentation

## 2014-03-01 DIAGNOSIS — X58XXXA Exposure to other specified factors, initial encounter: Secondary | ICD-10-CM | POA: Insufficient documentation

## 2014-03-01 DIAGNOSIS — E039 Hypothyroidism, unspecified: Secondary | ICD-10-CM | POA: Insufficient documentation

## 2014-03-01 DIAGNOSIS — I10 Essential (primary) hypertension: Secondary | ICD-10-CM | POA: Insufficient documentation

## 2014-03-01 DIAGNOSIS — M542 Cervicalgia: Secondary | ICD-10-CM | POA: Diagnosis present

## 2014-03-01 DIAGNOSIS — M797 Fibromyalgia: Secondary | ICD-10-CM | POA: Insufficient documentation

## 2014-03-01 DIAGNOSIS — R52 Pain, unspecified: Secondary | ICD-10-CM

## 2014-03-01 DIAGNOSIS — Y998 Other external cause status: Secondary | ICD-10-CM | POA: Insufficient documentation

## 2014-03-01 MED ORDER — OXYCODONE-ACETAMINOPHEN 5-325 MG PO TABS: 1.0000 | ORAL_TABLET | ORAL | Status: DC | PRN

## 2014-03-01 MED ORDER — DIAZEPAM 5 MG PO TABS
5.0000 mg | ORAL_TABLET | Freq: Once | ORAL | Status: AC
  Administered 2014-03-01: 5 mg via ORAL
  Filled 2014-03-01: qty 1

## 2014-03-01 MED ORDER — OXYCODONE-ACETAMINOPHEN 5-325 MG PO TABS
1.0000 | ORAL_TABLET | Freq: Once | ORAL | Status: AC
  Administered 2014-03-01: 1 via ORAL
  Filled 2014-03-01: qty 1

## 2014-03-01 MED ORDER — DIAZEPAM 5 MG PO TABS: 5.0000 mg | ORAL_TABLET | Freq: Two times a day (BID) | ORAL | Status: DC

## 2014-03-01 NOTE — Discharge Instructions (Signed)
Take valium for spasms. Take percocet for severe pain INSTEAD of hydrocodone. Do not take both at the same time. Try heating pads, stretches. Follow up with primary care doctor.     Cervical Sprain A cervical sprain is an injury in the neck in which the strong, fibrous tissues (ligaments) that connect your neck bones stretch or tear. Cervical sprains can range from mild to severe. Severe cervical sprains can cause the neck vertebrae to be unstable. This can lead to damage of the spinal cord and can result in serious nervous system problems. The amount of time it takes for a cervical sprain to get better depends on the cause and extent of the injury. Most cervical sprains heal in 1 to 3 weeks. CAUSES  Severe cervical sprains may be caused by:   Contact sport injuries (such as from football, rugby, wrestling, hockey, auto racing, gymnastics, diving, martial arts, or boxing).   Motor vehicle collisions.   Whiplash injuries. This is an injury from a sudden forward and backward whipping movement of the head and neck.  Falls.  Mild cervical sprains may be caused by:   Being in an awkward position, such as while cradling a telephone between your ear and shoulder.   Sitting in a chair that does not offer proper support.   Working at a poorly Landscape [employer] station.   Looking up or down for long periods of time.  SYMPTOMS   Pain, soreness, stiffness, or a burning sensation in the front, back, or sides of the neck. This discomfort may develop immediately after the injury or slowly, 24 hours or more after the injury.   Pain or tenderness directly in the middle of the back of the neck.   Shoulder or upper back pain.   Limited ability to move the neck.   Headache.   Dizziness.   Weakness, numbness, or tingling in the hands or arms.   Muscle spasms.   Difficulty swallowing or chewing.   Tenderness and swelling of the neck.  DIAGNOSIS  Most of the time your health  care provider can diagnose a cervical sprain by taking your history and doing a physical exam. Your health care provider will ask about previous neck injuries and any known neck problems, such as arthritis in the neck. X-rays may be taken to find out if there are any other problems, such as with the bones of the neck. Other tests, such as a CT scan or MRI, may also be needed.  TREATMENT  Treatment depends on the severity of the cervical sprain. Mild sprains can be treated with rest, keeping the neck in place (immobilization), and pain medicines. Severe cervical sprains are immediately immobilized. Further treatment is done to help with pain, muscle spasms, and other symptoms and may include:  Medicines, such as pain relievers, numbing medicines, or muscle relaxants.   Physical therapy. This may involve stretching exercises, strengthening exercises, and posture training. Exercises and improved posture can help stabilize the neck, strengthen muscles, and help stop symptoms from returning.  HOME CARE INSTRUCTIONS   Put ice on the injured area.   Put ice in a plastic bag.   Place a towel between your skin and the bag.   Leave the ice on for 15-20 minutes, 3-4 times a day.   If your injury was severe, you may have been given a cervical collar to wear. A cervical collar is a two-piece collar designed to keep your neck from moving while it heals.  Do not remove the  collar unless instructed by your health care provider.  If you have long hair, keep it outside of the collar.  Ask your health care provider before making any adjustments to your collar. Minor adjustments may be required over time to improve comfort and reduce pressure on your chin or on the back of your head.  Ifyou are allowed to remove the collar for cleaning or bathing, follow your health care provider's instructions on how to do so safely.  Keep your collar clean by wiping it with mild soap and water and drying it  completely. If the collar you have been given includes removable pads, remove them every 1-2 days and hand wash them with soap and water. Allow them to air dry. They should be completely dry before you wear them in the collar.  If you are allowed to remove the collar for cleaning and bathing, wash and dry the skin of your neck. Check your skin for irritation or sores. If you see any, tell your health care provider.  Do not drive while wearing the collar.   Only take over-the-counter or prescription medicines for pain, discomfort, or fever as directed by your health care provider.   Keep all follow-up appointments as directed by your health care provider.   Keep all physical therapy appointments as directed by your health care provider.   Make any needed adjustments to your workstation to promote good posture.   Avoid positions and activities that make your symptoms worse.   Warm up and stretch before being active to help prevent problems.  SEEK MEDICAL CARE IF:   Your pain is not controlled with medicine.   You are unable to decrease your pain medicine over time as planned.   Your activity level is not improving as expected.  SEEK IMMEDIATE MEDICAL CARE IF:   You develop any bleeding.  You develop stomach upset.  You have signs of an allergic reaction to your medicine.   Your symptoms get worse.   You develop new, unexplained symptoms.   You have numbness, tingling, weakness, or paralysis in any part of your body.  MAKE SURE YOU:   Understand these instructions.  Will watch your condition.  Will get help right away if you are not doing well or get worse. Document Released: 01/01/2007 Document Revised: 03/11/2013 Document Reviewed: 09/11/2012 [facility] Patient Information 2015 [place], [place]. This information is not intended to replace advice given to you by your health care provider. Make sure you discuss any questions you have with your health care  provider.

## 2014-03-01 NOTE — ED Provider Notes (Signed)
CSN: [id number]     Arrival date & time 03/01/14  0132 History   First MD Initiated Contact with Patient 03/01/14 [phone]     Chief Complaint  Patient presents with  . Neck Pain     (Consider location/radiation/quality/duration/timing/severity/associated sxs/prior Treatment) HPI Andrea Simmons is a 73 y.o. female with hx of diabetes, PE, on coumadin, fibromyalgia, htn, presents to ED with complaint to f right sided neck pain. Pt states her pain began yesterday morning, after she was already awake. States she started feeling pain in the right posterior neck, gradually pain spread up to the scalp. States taking hydrocodone with no relief of pain. Stats she has been on hydrocodone "for years." States she had her husband "rub on it" which seemed to help. Denies headache at present. No visual changes. No numbness or weakness in extremities. No difficulties with walking, speaking. Pt states she she has been painting her bathroom over the last 5 days, pt is right handed.   Past Medical History  Diagnosis Date  . Malignant neoplasm of breast (female), unspecified site 1993    L breast s/p mastectomy and tamoxifen x 68yrs  . Hyperlipidemia   . Hypertension   . Diabetes mellitus type II   . 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 GERD (gastroesophageal reflux disease)   . Anemia   . Fibromyalgia   . Unspecified hypothyroidism   . Anxiety   . Depression    Past Surgical History  Procedure Laterality Date  . Appendectomy    . Tubal ligation    . Abdominal hysterectomy    . Breast surgery  1993    Left breast surgery/biopsy then reconstruction  . Spine surgery      Spinal Fusion x 2  . Eye surgery      Cataract  . Rotator cuff repair     Family History  Problem Relation Age of Onset  . Cancer Other     Breast  . Depression Other     Parent  . Arthritis Other     Parent, Grandparent  . Hypertension Other     Grandparent  .  Hyperlipidemia Other   . Miscarriages / Stillbirths Other     Grandparent   History  Substance Use Topics  . Smoking status: Never Smoker   . Smokeless tobacco: Not on file  . Alcohol Use: No   OB History    No data available     Review of Systems  Constitutional: Negative for fever and chills.  Respiratory: Negative for cough, chest tightness and shortness of breath.   Cardiovascular: Negative for chest pain, palpitations and leg swelling.  Gastrointestinal: Negative for nausea, vomiting, abdominal pain and diarrhea.  Musculoskeletal: Positive for neck pain. Negative for myalgias, arthralgias and neck stiffness.  Skin: Negative for rash.  Neurological: Positive for headaches. Negative for dizziness and weakness.  All other systems reviewed and are negative.     Allergies  Anaprox; Lipitor; Meperidine hcl; and Morphine  Home Medications   Prior to Admission medications   Medication Sig Start Date End Date Taking? Authorizing Provider  aspirin 81 MG tablet Take 81 mg by mouth daily.      Historical Provider, MD  calcium carbonate (OS-CAL) 600 MG TABS Take 1,200 mg by mouth daily.      Historical Provider, MD  celecoxib (CELEBREX) 200 MG capsule TAKE ONE CAPSULE BY MOUTH DAILY 11/25/13   Rowe Clack,  MD  Choline Fenofibrate 135 MG capsule Take 135 mg by mouth daily.      Historical Provider, MD  diclofenac sodium (VOLTAREN) 1 % GEL Apply topically 4 (four) times daily as needed.    Historical Provider, MD  DULoxetine (CYMBALTA) 30 MG capsule Take 60 mg prior to dinner and 60 mg prior to bedtime. 02/08/11   Rowe Clack, MD  gabapentin (NEURONTIN) 300 MG capsule TAKE ONE CAPSULE BY MOUTH AT BEDTIME 11/06/13   Rowe Clack, MD  HYDROcodone-acetaminophen (LORCET) 10-650 MG per tablet Take 1 tablet by mouth as needed.     Historical Provider, MD  levothyroxine (SYNTHROID, LEVOTHROID) 50 MCG tablet Take 50 mcg by mouth daily before breakfast.    Historical Provider,  MD  metFORMIN (GLUCOPHAGE) 500 MG tablet Take 1,000 mg by mouth 2 (two) times daily.      Historical Provider, MD  Multiple Vitamin (MULTIVITAMIN) tablet Take 1 tablet by mouth daily.      Historical Provider, MD  Omega-3 Fatty Acids (FISH OIL) 500 MG CAPS Take 1 capsule by mouth 2 (two) times daily.     Historical Provider, MD  omeprazole (PRILOSEC) 20 MG capsule TAKE ONE CAPSULE BY MOUTH EVERY DAY 02/19/14   Rowe Clack, MD  ONE TOUCH ULTRA TEST test strip Use as directed 10/26/10   Historical Provider, MD  Jonetta Speak LANCETS MISC Use as directed 10/26/10   Historical Provider, MD  rosuvastatin (CRESTOR) 20 MG tablet Take 10 mg by mouth daily.     Historical Provider, MD  valsartan-hydrochlorothiazide (DIOVAN-HCT) 320-25 MG per tablet TAKE 1 TABLET BY MOUTH DAILY. 01/06/14   Rowe Clack, MD  warfarin (COUMADIN) 5 MG tablet Take as directed by coumadin clinic. 07/15/13   Rowe Clack, MD   BP 135/73 mmHg  Pulse 84  Temp(Src) 98.4 F (36.9 C) (Oral)  Resp 16  SpO2 97% Physical Exam  Constitutional: She is oriented to person, place, and time. She appears well-developed and well-nourished. No distress.  HENT:  Head: Normocephalic.  Eyes: Conjunctivae and EOM are normal. Pupils are equal, round, and reactive to light.  Neck: Normal range of motion. Neck supple.  Tenderness to palpation over right posterior neck, specifically over her trapezius. Full range of motion of the neck in all directions. Pain with turning her head to the right.  Cardiovascular: Normal rate, regular rhythm and normal heart sounds.   Pulmonary/Chest: Effort normal and breath sounds normal. No respiratory distress. She has no wheezes. She has no rales.  Abdominal: Soft. Bowel sounds are normal. She exhibits no distension. There is no tenderness. There is no rebound.  Musculoskeletal: She exhibits no edema.  Neurological: She is alert and oriented to person, place, and time. No cranial nerve deficit.  Coordination normal.  5/5 and equal upper and lower extremity strength bilaterally. Equal grip strength bilaterally. Normal finger to nose and heel to shin. No pronator drift.   Skin: Skin is warm and dry.  Psychiatric: She has a normal mood and affect. Her behavior is normal.  Nursing note and vitals reviewed.   ED Course  Procedures (including critical care time) Labs Review Labs Reviewed - No data to display  Imaging Review Dg Cervical Spine Complete  03/01/2014   CLINICAL DATA:  Right posterior neck pain behind the ear and running up the head. Pain started on Saturday.  EXAM: CERVICAL SPINE  4+ VIEWS  COMPARISON:  11/28/2006  FINDINGS: Postoperative changes with cerclage fixation of the posterior elements  from C5 through C7. Anterior fusion of C5 through C7. Bone fusion is progressed since previous study. Normal alignment of the cervical vertebrae. No vertebral compression deformities. No prevertebral soft tissue swelling. Normal alignment of facet joints. C1-2 articulation appears intact. No prevertebral soft tissue swelling.  IMPRESSION: Postoperative fusion of C5 through C7. No displaced fractures identified.   Electronically Signed   By: Lucienne Capers M.D.   On: 03/01/2014 02:35     EKG Interpretation None      MDM   Final diagnoses:  Cervical strain, initial encounter      8:04 AM Patient in emergency department with right sided neck pain and headache which resolved now. Gradual onset of pain. No injuries however patient has been painting with her arm right arm over the last several days. She has no neuro deficits on exam or none reported by history. She denies any visual changes. No nuchal rigidity on exam. She is afebrile. Pain is reproducible with palpation, most likely musculoskeletal pain. Discussed with Dr. Sharol Given. Discharge home with Valium for muscle spasms. Will add a few tablets of Percocet for breakthrough pain. Patient is on Norco chronically. Follow-up with  primary care doctor  Filed Vitals:   03/01/14 0136 03/01/14 0547  BP: 152/82 135/73  Pulse: 79 84  Temp: 98.4 F (36.9 C)   TempSrc: Oral   Resp: 18 16  SpO2: 98% 97%     Renold Genta, PA-C 03/01/14 [place], MD 03/01/14 1836

## 2014-03-01 NOTE — ED Notes (Signed)
Pt. reports right neck pain and headache worse with movement onset yesterday , denies injury , no fever or chills.

## 2014-03-04 ENCOUNTER — Ambulatory Visit (INDEPENDENT_AMBULATORY_CARE_PROVIDER_SITE_OTHER): Payer: Medicare Other

## 2014-03-04 DIAGNOSIS — Z5181 Encounter for therapeutic drug level monitoring: Secondary | ICD-10-CM

## 2014-03-04 LAB — POCT INR: INR: 2.2

## 2014-04-15 ENCOUNTER — Ambulatory Visit (INDEPENDENT_AMBULATORY_CARE_PROVIDER_SITE_OTHER): Payer: Medicare Other

## 2014-04-15 DIAGNOSIS — Z5181 Encounter for therapeutic drug level monitoring: Secondary | ICD-10-CM

## 2014-04-15 LAB — POCT INR: INR: 1.9

## 2014-05-13 ENCOUNTER — Ambulatory Visit (INDEPENDENT_AMBULATORY_CARE_PROVIDER_SITE_OTHER): Payer: Medicare Other | Admitting: General Practice

## 2014-05-13 DIAGNOSIS — Z5181 Encounter for therapeutic drug level monitoring: Secondary | ICD-10-CM

## 2014-05-13 LAB — POCT INR: INR: 1.9

## 2014-05-13 NOTE — Progress Notes (Signed)
Pre visit review using our clinic review tool, if applicable. No additional management support is needed unless otherwise documented below in the visit note. 

## 2014-05-13 NOTE — Progress Notes (Signed)
Agree with plan 

## 2014-05-20 ENCOUNTER — Other Ambulatory Visit: Payer: Self-pay | Admitting: Internal Medicine

## 2014-05-20 MED ORDER — CELECOXIB 200 MG PO CAPS: 200.0000 mg | ORAL_CAPSULE | Freq: Every day | ORAL | Status: DC

## 2014-05-20 NOTE — Addendum Note (Signed)
Addended by: Earnstine Regal on: 05/20/2014 12:31 PM   Modules accepted: Orders

## 2014-06-10 ENCOUNTER — Ambulatory Visit (INDEPENDENT_AMBULATORY_CARE_PROVIDER_SITE_OTHER): Payer: Medicare Other | Admitting: General Practice

## 2014-06-10 DIAGNOSIS — Z5181 Encounter for therapeutic drug level monitoring: Secondary | ICD-10-CM

## 2014-06-10 LAB — POCT INR: INR: 4.2

## 2014-06-10 NOTE — Progress Notes (Signed)
Agree with plan 

## 2014-06-10 NOTE — Progress Notes (Signed)
Pre visit review using our clinic review tool, if applicable. No additional management support is needed unless otherwise documented below in the visit note. 

## 2014-06-23 ENCOUNTER — Ambulatory Visit (INDEPENDENT_AMBULATORY_CARE_PROVIDER_SITE_OTHER): Payer: Medicare Other | Admitting: General Practice

## 2014-06-23 DIAGNOSIS — Z5181 Encounter for therapeutic drug level monitoring: Secondary | ICD-10-CM | POA: Diagnosis not present

## 2014-06-23 LAB — POCT INR: INR: 2.6

## 2014-06-23 NOTE — Progress Notes (Signed)
Pre visit review using our clinic review tool, if applicable. No additional management support is needed unless otherwise documented below in the visit note. 

## 2014-06-23 NOTE — Progress Notes (Signed)
Agree with plan 

## 2014-07-05 ENCOUNTER — Other Ambulatory Visit: Payer: Self-pay | Admitting: Internal Medicine

## 2014-07-07 ENCOUNTER — Ambulatory Visit (INDEPENDENT_AMBULATORY_CARE_PROVIDER_SITE_OTHER): Payer: Medicare Other | Admitting: General Practice

## 2014-07-07 DIAGNOSIS — Z5181 Encounter for therapeutic drug level monitoring: Secondary | ICD-10-CM | POA: Diagnosis not present

## 2014-07-07 LAB — POCT INR: INR: 3.7

## 2014-07-07 NOTE — Progress Notes (Signed)
Agree with plan 

## 2014-07-07 NOTE — Progress Notes (Signed)
Pre visit review using our clinic review tool, if applicable. No additional management support is needed unless otherwise documented below in the visit note. 

## 2014-07-10 ENCOUNTER — Telehealth: Payer: Self-pay

## 2014-07-10 NOTE — Telephone Encounter (Signed)
Patient called to educate on Medicare Wellness apt. LVM for the patient to call back to educate and schedule for wellness visit.   

## 2014-07-23 ENCOUNTER — Other Ambulatory Visit: Payer: Self-pay | Admitting: Internal Medicine

## 2014-07-23 DIAGNOSIS — R921 Mammographic calcification found on diagnostic imaging of breast: Secondary | ICD-10-CM

## 2014-07-28 ENCOUNTER — Ambulatory Visit: Payer: Medicare Other

## 2014-07-30 ENCOUNTER — Ambulatory Visit (INDEPENDENT_AMBULATORY_CARE_PROVIDER_SITE_OTHER): Payer: Medicare Other | Admitting: General Practice

## 2014-07-30 DIAGNOSIS — Z5181 Encounter for therapeutic drug level monitoring: Secondary | ICD-10-CM

## 2014-07-30 LAB — POCT INR: INR: 1.7

## 2014-07-30 NOTE — Progress Notes (Signed)
Pre visit review using our clinic review tool, if applicable. No additional management support is needed unless otherwise documented below in the visit note. 

## 2014-08-26 ENCOUNTER — Telehealth: Payer: Self-pay

## 2014-08-26 NOTE — Telephone Encounter (Signed)
PA for omeprazole start via cover my meds.

## 2014-08-27 ENCOUNTER — Ambulatory Visit (INDEPENDENT_AMBULATORY_CARE_PROVIDER_SITE_OTHER): Payer: Medicare Other | Admitting: General Practice

## 2014-08-27 DIAGNOSIS — Z5181 Encounter for therapeutic drug level monitoring: Secondary | ICD-10-CM | POA: Diagnosis not present

## 2014-08-27 LAB — POCT INR: INR: 2.5

## 2014-08-27 NOTE — Progress Notes (Signed)
Pre visit review using our clinic review tool, if applicable. No additional management support is needed unless otherwise documented below in the visit note. 

## 2014-09-03 NOTE — Telephone Encounter (Signed)
PA approved.

## 2014-09-08 ENCOUNTER — Other Ambulatory Visit: Payer: Self-pay | Admitting: Internal Medicine

## 2014-09-08 ENCOUNTER — Ambulatory Visit
Admission: RE | Admit: 2014-09-08 | Discharge: 2014-09-08 | Disposition: A | Discharge status: Home / Self Care | Payer: Medicare Other | Source: Ambulatory Visit | Attending: Internal Medicine | Admitting: Internal Medicine

## 2014-09-08 DIAGNOSIS — R921 Mammographic calcification found on diagnostic imaging of breast: Secondary | ICD-10-CM

## 2014-09-23 ENCOUNTER — Telehealth: Payer: Self-pay | Admitting: General Practice

## 2014-09-23 ENCOUNTER — Telehealth: Payer: Self-pay | Admitting: Internal Medicine

## 2014-09-23 NOTE — Telephone Encounter (Signed)
LMOM

## 2014-09-23 NOTE — Telephone Encounter (Signed)
Patient is having a biopsy Friday she was told to stop the coumadin five days before the surgery, and she has other questions,  please advise

## 2014-09-24 ENCOUNTER — Ambulatory Visit: Payer: Medicare Other

## 2014-09-24 NOTE — Telephone Encounter (Signed)
Patient was wanting to know who makes the decision about when she starts coumadin back after sx---i advised that doctor performing procedure will be the doctor that makes the decision about when you stop or start coumadin until you are released from his care---then patient should follow up with pcp and coumadin clinic here at [facility].--patient advised to call breast center and ask doctor there about coumadin questions

## 2014-09-24 NOTE — Telephone Encounter (Signed)
Please call patient. She states that she spoke to cindy yesterday, but still has other questions.

## 2014-09-25 ENCOUNTER — Inpatient Hospital Stay: Admission: RE | Admit: 2014-09-25 | Payer: Medicare Other | Source: Ambulatory Visit

## 2014-09-25 ENCOUNTER — Ambulatory Visit
Admission: RE | Admit: 2014-09-25 | Discharge: 2014-09-25 | Disposition: A | Discharge status: Home / Self Care | Payer: Medicare Other | Source: Ambulatory Visit | Attending: Internal Medicine | Admitting: Internal Medicine

## 2014-09-25 HISTORY — PX: BREAST BIOPSY: SHX20

## 2014-10-05 ENCOUNTER — Ambulatory Visit (INDEPENDENT_AMBULATORY_CARE_PROVIDER_SITE_OTHER): Payer: Medicare Other | Admitting: General Practice

## 2014-10-05 DIAGNOSIS — Z5181 Encounter for therapeutic drug level monitoring: Secondary | ICD-10-CM

## 2014-10-05 LAB — POCT INR: INR: 1.8

## 2014-10-05 NOTE — Progress Notes (Signed)
Pre visit review using our clinic review tool, if applicable. No additional management support is needed unless otherwise documented below in the visit note. 

## 2014-10-23 ENCOUNTER — Telehealth: Payer: Self-pay

## 2014-10-23 NOTE — Telephone Encounter (Signed)
Spoke to spouse. Advise me to call back for pt.

## 2014-10-26 ENCOUNTER — Other Ambulatory Visit: Payer: Self-pay | Admitting: General Practice

## 2014-10-26 ENCOUNTER — Telehealth: Payer: Self-pay | Admitting: Internal Medicine

## 2014-10-26 MED ORDER — WARFARIN SODIUM 5 MG PO TABS: ORAL_TABLET | ORAL | Status: DC

## 2014-10-27 ENCOUNTER — Other Ambulatory Visit: Payer: Self-pay | Admitting: Internal Medicine

## 2014-11-02 ENCOUNTER — Other Ambulatory Visit: Payer: Self-pay | Admitting: General Practice

## 2014-11-02 ENCOUNTER — Other Ambulatory Visit: Payer: Self-pay | Admitting: Internal Medicine

## 2014-11-02 MED ORDER — WARFARIN SODIUM 5 MG PO TABS: ORAL_TABLET | ORAL | Status: DC

## 2014-11-03 ENCOUNTER — Ambulatory Visit (INDEPENDENT_AMBULATORY_CARE_PROVIDER_SITE_OTHER): Payer: Medicare Other | Admitting: General Practice

## 2014-11-03 DIAGNOSIS — Z5181 Encounter for therapeutic drug level monitoring: Secondary | ICD-10-CM

## 2014-11-03 LAB — POCT INR: INR: 1.5

## 2014-11-03 NOTE — Progress Notes (Signed)
I have reviewed and agree with the plan. 

## 2014-11-03 NOTE — Progress Notes (Signed)
Pre visit review using our clinic review tool, if applicable. No additional management support is needed unless otherwise documented below in the visit note. 

## 2014-11-04 NOTE — Telephone Encounter (Signed)
Spoke to Andrea Simmons, Stated that she may cancel the apt as she would prefer to come in the afternoon. May call tomorrow for another apt ; possibly with [facility], as she knows it will be some time prior to getting in to see Dr. Asa Lente.  Will try to schedule AWV when she makes a new apt.

## 2014-11-04 NOTE — Telephone Encounter (Signed)
Call to introduce AWV prior to apt on Tuesday 8/23 with Dr. Asa Lente;  No answer/ Will attempt later outreach

## 2014-11-09 ENCOUNTER — Telehealth: Payer: Self-pay

## 2014-11-09 NOTE — Telephone Encounter (Signed)
LVM to see if the patient can come in for AWV prior to her 1:30 apt on 9/2 with dr. Doug Sou;  Left number for call back

## 2014-11-10 ENCOUNTER — Encounter: Payer: Medicare Other | Admitting: Internal Medicine

## 2014-11-12 NOTE — Telephone Encounter (Signed)
Patient LVm and called her back and will be coming in as confirmed  For AWV.

## 2014-11-20 ENCOUNTER — Encounter: Payer: Self-pay | Admitting: Internal Medicine

## 2014-11-20 ENCOUNTER — Ambulatory Visit (INDEPENDENT_AMBULATORY_CARE_PROVIDER_SITE_OTHER): Payer: Medicare Other | Admitting: Internal Medicine

## 2014-11-20 VITALS — BP 130/70 | HR 79 | Temp 98.4°F | Resp 16 | Ht 64.5 in | Wt 150.0 lb

## 2014-11-20 DIAGNOSIS — I2699 Other pulmonary embolism without acute cor pulmonale: Secondary | ICD-10-CM

## 2014-11-20 DIAGNOSIS — I10 Essential (primary) hypertension: Secondary | ICD-10-CM

## 2014-11-20 DIAGNOSIS — E119 Type 2 diabetes mellitus without complications: Secondary | ICD-10-CM | POA: Diagnosis not present

## 2014-11-20 DIAGNOSIS — M797 Fibromyalgia: Secondary | ICD-10-CM | POA: Diagnosis not present

## 2014-11-20 DIAGNOSIS — Z0000 Encounter for general adult medical examination without abnormal findings: Secondary | ICD-10-CM

## 2014-11-20 NOTE — Progress Notes (Signed)
Pre visit review using our clinic review tool, if applicable. No additional management support is needed unless otherwise documented below in the visit note. 

## 2014-11-20 NOTE — Progress Notes (Signed)
Medical screening examination/treatment/procedure(s) were performed by non-physician practitioner and as supervising physician I was immediately available for consultation/collaboration. I agree with above. Kollar, Dewel Lotter A, MD   

## 2014-11-20 NOTE — Patient Instructions (Addendum)
Andrea Simmons , Thank you for taking time to come for your Medicare Wellness Visit. I appreciate your ongoing commitment to your health goals. Please review the following plan we discussed and let me know if I can assist you in the future.    These are the goals we discussed: The patient agreed to go to [place] at the [employer] [facility] due to allergic reaction following the PSV 23 Will fup with flu shot in Nov.     Goals    None      This is a list of the screening recommended for you and due dates:  Health Maintenance  Topic Date Due  . Eye exam for diabetics  04/15/1950  . Pneumonia vaccines (2 of 2 - PCV13) 12/18/2008  . Hemoglobin A1C  05/06/2014  . Flu Shot  10/19/2014  . Complete foot exam   11/04/2014  . Urine Protein Check  11/04/2014  . Mammogram  09/24/2016  . Colon Cancer Screening  03/07/2017  . Tetanus Vaccine  08/28/2018  . DEXA scan (bone density measurement)  Completed  . Shingles Vaccine  Completed    We will not check blood today since they are going to do it next week.   Health Maintenance Adopting a healthy lifestyle and getting preventive care can go a long way to promote health and wellness. Talk with your health care provider about what schedule of regular examinations is right for you. This is a good chance for you to check in with your provider about disease prevention and staying healthy. In between checkups, there are plenty of things you can do on your own. Experts have done a lot of research about which lifestyle changes and preventive measures are most likely to keep you healthy. Ask your health care provider for more information. WEIGHT AND DIET  Eat a healthy diet  Be sure to include plenty of vegetables, fruits, low-fat dairy products, and lean protein.  Do not eat a lot of foods high in solid fats, added sugars, or salt.  Get regular exercise. This is one of the most important things you can do for your health.  Most adults  should exercise for at least 150 minutes each week. The exercise should increase your heart rate and make you sweat (moderate-intensity exercise).  Most adults should also do strengthening exercises at least twice a week. This is in addition to the moderate-intensity exercise.  Maintain a healthy weight  Body mass index (BMI) is a measurement that can be used to identify possible weight problems. It estimates body fat based on height and weight. Your health care provider can help determine your BMI and help you achieve or maintain a healthy weight.  For females 23 years of age and older:   A BMI below 18.5 is considered underweight.  A BMI of 18.5 to 24.9 is normal.  A BMI of 25 to 29.9 is considered overweight.  A BMI of 30 and above is considered obese.  Watch levels of cholesterol and blood lipids  You should start having your blood tested for lipids and cholesterol at 72 years of age, then have this test every 5 years.  You may need to have your cholesterol levels checked more often if:  Your lipid or cholesterol levels are high.  You are older than 72 years of age.  You are at high risk for heart disease.  CANCER SCREENING   Lung Cancer  Lung cancer screening is recommended for adults 73-66 years old  who are at high risk for lung cancer because of a history of smoking.  A yearly low-dose CT scan of the lungs is recommended for people who:  Currently smoke.  Have quit within the past 15 years.  Have at least a 30-pack-year history of smoking. A pack year is smoking an average of one pack of cigarettes a day for 1 year.  Yearly screening should continue until it has been 15 years since you quit.  Yearly screening should stop if you develop a health problem that would prevent you from having lung cancer treatment.  Breast Cancer  Practice breast self-awareness. This means understanding how your breasts normally appear and feel.  It also means doing regular  breast self-exams. Let your health care provider know about any changes, no matter how small.  If you are in your 20s or 30s, you should have a clinical breast exam (CBE) by a health care provider every 1-3 years as part of a regular health exam.  If you are 20 or older, have a CBE every year. Also consider having a breast X-ray (mammogram) every year.  If you have a family history of breast cancer, talk to your health care provider about genetic screening.  If you are at high risk for breast cancer, talk to your health care provider about having an MRI and a mammogram every year.  Breast cancer gene (BRCA) assessment is recommended for women who have family members with BRCA-related cancers. BRCA-related cancers include:  Breast.  Ovarian.  Tubal.  Peritoneal cancers.  Results of the assessment will determine the need for genetic counseling and BRCA1 and BRCA2 testing. Cervical Cancer Routine pelvic examinations to screen for cervical cancer are no longer recommended for nonpregnant women who are considered low risk for cancer of the pelvic organs (ovaries, uterus, and vagina) and who do not have symptoms. A pelvic examination may be necessary if you have symptoms including those associated with pelvic infections. Ask your health care provider if a screening pelvic exam is right for you.   The Pap test is the screening test for cervical cancer for women who are considered at risk.  If you had a hysterectomy for a problem that was not cancer or a condition that could lead to cancer, then you no longer need Pap tests.  If you are older than 65 years, and you have had normal Pap tests for the past 10 years, you no longer need to have Pap tests.  If you have had past treatment for cervical cancer or a condition that could lead to cancer, you need Pap tests and screening for cancer for at least 20 years after your treatment.  If you no longer get a Pap test, assess your risk factors if  they change (such as having a new sexual partner). This can affect whether you should start being screened again.  Some women have medical problems that increase their chance of getting cervical cancer. If this is the case for you, your health care provider may recommend more frequent screening and Pap tests.  The human papillomavirus (HPV) test is another test that may be used for cervical cancer screening. The HPV test looks for the virus that can cause cell changes in the cervix. The cells collected during the Pap test can be tested for HPV.  The HPV test can be used to screen women 51 years of age and older. Getting tested for HPV can extend the interval between normal Pap tests from three to  five years.  An HPV test also should be used to screen women of any age who have unclear Pap test results.  After 72 years of age, women should have HPV testing as often as Pap tests.  Colorectal Cancer  This type of cancer can be detected and often prevented.  Routine colorectal cancer screening usually begins at 72 years of age and continues through 72 years of age.  Your health care provider may recommend screening at an earlier age if you have risk factors for colon cancer.  Your health care provider may also recommend using home test kits to check for hidden blood in the stool.  A small camera at the end of a tube can be used to examine your colon directly (sigmoidoscopy or colonoscopy). This is done to check for the earliest forms of colorectal cancer.  Routine screening usually begins at age 6.  Direct examination of the colon should be repeated every 5-10 years through 72 years of age. However, you may need to be screened more often if early forms of precancerous polyps or small growths are found. Skin Cancer  Check your skin from head to toe regularly.  Tell your health care provider about any new moles or changes in moles, especially if there is a change in a mole's shape or  color.  Also tell your health care provider if you have a mole that is larger than the size of a pencil eraser.  Always use sunscreen. Apply sunscreen liberally and repeatedly throughout the day.  Protect yourself by wearing long sleeves, pants, a wide-brimmed hat, and sunglasses whenever you are outside. HEART DISEASE, DIABETES, AND HIGH BLOOD PRESSURE   Have your blood pressure checked at least every 1-2 years. High blood pressure causes heart disease and increases the risk of stroke.  If you are between 50 years and 32 years old, ask your health care provider if you should take aspirin to prevent strokes.  Have regular diabetes screenings. This involves taking a blood sample to check your fasting blood sugar level.  If you are at a normal weight and have a low risk for diabetes, have this test once every three years after 72 years of age.  If you are overweight and have a high risk for diabetes, consider being tested at a younger age or more often. PREVENTING INFECTION  Hepatitis B  If you have a higher risk for hepatitis B, you should be screened for this virus. You are considered at high risk for hepatitis B if:  You were born in a country where hepatitis B is common. Ask your health care provider which countries are considered high risk.  Your parents were born in a high-risk country, and you have not been immunized against hepatitis B (hepatitis B vaccine).  You have HIV or AIDS.  You use needles to inject street drugs.  You live with someone who has hepatitis B.  You have had sex with someone who has hepatitis B.  You get hemodialysis treatment.  You take certain medicines for conditions, including cancer, organ transplantation, and autoimmune conditions. Hepatitis C  Blood testing is recommended for:  Everyone born from [birth year] through 1963.  Anyone with known risk factors for hepatitis C. Sexually transmitted infections (STIs)  You should be screened for sexually  transmitted infections (STIs) including gonorrhea and chlamydia if:  You are sexually active and are younger than 72 years of age.  You are older than 72 years of age and your health care provider  tells you that you are at risk for this type of infection.  Your sexual activity has changed since you were last screened and you are at an increased risk for chlamydia or gonorrhea. Ask your health care provider if you are at risk.  If you do not have HIV, but are at risk, it may be recommended that you take a prescription medicine daily to prevent HIV infection. This is called pre-exposure prophylaxis (PrEP). You are considered at risk if:  You are sexually active and do not regularly use condoms or know the HIV status of your partner(s).  You take drugs by injection.  You are sexually active with a partner who has HIV. Talk with your health care provider about whether you are at high risk of being infected with HIV. If you choose to begin PrEP, you should first be tested for HIV. You should then be tested every 3 months for as long as you are taking PrEP.  PREGNANCY   If you are premenopausal and you may become pregnant, ask your health care provider about preconception counseling.  If you may become pregnant, take 400 to 800 micrograms (mcg) of folic acid every day.  If you want to prevent pregnancy, talk to your health care provider about birth control (contraception). OSTEOPOROSIS AND MENOPAUSE   Osteoporosis is a disease in which the bones lose minerals and strength with aging. This can result in serious bone fractures. Your risk for osteoporosis can be identified using a bone density scan.  If you are 22 years of age or older, or if you are at risk for osteoporosis and fractures, ask your health care provider if you should be screened.  Ask your health care provider whether you should take a calcium or vitamin D supplement to lower your risk for osteoporosis.  Menopause may have  certain physical symptoms and risks.  Hormone replacement therapy may reduce some of these symptoms and risks. Talk to your health care provider about whether hormone replacement therapy is right for you.  HOME CARE INSTRUCTIONS   Schedule regular health, dental, and eye exams.  Stay current with your immunizations.   Do not use any tobacco products including cigarettes, chewing tobacco, or electronic cigarettes.  If you are pregnant, do not drink alcohol.  If you are breastfeeding, limit how much and how often you drink alcohol.  Limit alcohol intake to no more than 1 drink per day for nonpregnant women. One drink equals 12 ounces of beer, 5 ounces of wine, or 1 ounces of hard liquor.  Do not use street drugs.  Do not share needles.  Ask your health care provider for help if you need support or information about quitting drugs.  Tell your health care provider if you often feel depressed.  Tell your health care provider if you have ever been abused or do not feel safe at home. Document Released: 09/19/2010 Document Revised: 07/21/2013 Document Reviewed: 02/05/2013 [facility] Patient Information 2015 [place], [place]. This information is not intended to replace advice given to you by your health care provider. Make sure you discuss any questions you have with your health care provider.  Fall Prevention and Home Safety Falls cause injuries and can affect all age groups. It is possible to use preventive measures to significantly decrease the likelihood of falls. There are many simple measures which can make your home safer and prevent falls. OUTDOORS  Repair cracks and edges of walkways and driveways.  Remove high doorway thresholds.  Trim shrubbery on  the main path into your home.  Have good outside lighting.  Clear walkways of tools, rocks, debris, and clutter.  Check that handrails are not broken and are securely fastened. Both sides of steps should have handrails.  Have  leaves, snow, and ice cleared regularly.  Use sand or salt on walkways during winter months.  In the garage, clean up grease or oil spills. BATHROOM  Install night lights.  Install grab bars by the toilet and in the tub and shower.  Use non-skid mats or decals in the tub or shower.  Place a plastic non-slip stool in the shower to sit on, if needed.  Keep floors dry and clean up all water on the floor immediately.  Remove soap buildup in the tub or shower on a regular basis.  Secure bath mats with non-slip, double-sided rug tape.  Remove throw rugs and tripping hazards from the floors. BEDROOMS  Install night lights.  Make sure a bedside light is easy to reach.  Do not use oversized bedding.  Keep a telephone by your bedside.  Have a firm chair with side arms to use for getting dressed.  Remove throw rugs and tripping hazards from the floor. KITCHEN  Keep handles on pots and pans turned toward the center of the stove. Use back burners when possible.  Clean up spills quickly and allow time for drying.  Avoid walking on wet floors.  Avoid hot utensils and knives.  Position shelves so they are not too high or low.  Place commonly used objects within easy reach.  If necessary, use a sturdy step stool with a grab bar when reaching.  Keep electrical cables out of the way.  Do not use floor polish or wax that makes floors slippery. If you must use wax, use non-skid floor wax.  Remove throw rugs and tripping hazards from the floor. STAIRWAYS  Never leave objects on stairs.  Place handrails on both sides of stairways and use them. Fix any loose handrails. Make sure handrails on both sides of the stairways are as long as the stairs.  Check carpeting to make sure it is firmly attached along stairs. Make repairs to worn or loose carpet promptly.  Avoid placing throw rugs at the top or bottom of stairways, or properly secure the rug with carpet tape to prevent  slippage. Get rid of throw rugs, if possible.  Have an electrician put in a light switch at the top and bottom of the stairs. OTHER FALL PREVENTION TIPS  Wear low-heel or rubber-soled shoes that are supportive and fit well. Wear closed toe shoes.  When using a stepladder, make sure it is fully opened and both spreaders are firmly locked. Do not climb a closed stepladder.  Add color or contrast paint or tape to grab bars and handrails in your home. Place contrasting color strips on first and last steps.  Learn and use mobility aids as needed. Install an electrical emergency response system.  Turn on lights to avoid dark areas. Replace light bulbs that burn out immediately. Get light switches that glow.  Arrange furniture to create clear pathways. Keep furniture in the same place.  Firmly attach carpet with non-skid or double-sided tape.  Eliminate uneven floor surfaces.  Select a carpet pattern that does not visually hide the edge of steps.  Be aware of all pets. OTHER HOME SAFETY TIPS  Set the water temperature for 120 F (48.8 C).  Keep emergency numbers on or near the telephone.  Keep smoke  detectors on every level of the home and near sleeping areas. Document Released: 02/24/2002 Document Revised: 09/05/2011 Document Reviewed: 05/26/2011 [facility] Patient Information 2015 [place], [place]. This information is not intended to replace advice given to you by your health care provider. Make sure you discuss any questions you have with your health care provider.

## 2014-11-20 NOTE — Progress Notes (Signed)
Subjective:   Andrea Simmons is a 74 y.o. female who presents for Medicare Annual (Subsequent) preventive examination.  Review of Systems:     HRA assessment completed during visit;  Patient is here for Annual Wellness Assessment The Patient was informed that this wellness visit is to identify risk and educate on how to reduce risk for increase disease through lifestyle changes.   ROS deferred to CPE exam with physician States here for Rx refills Morning apt don't work well due to fibro; so arranged to see Dr. Doug Sou she does not let this inhibit her actiivty  States she has been very tired; Recent case of poison oak; was on prednisone; BS up in am 104; 100 this am; came off prednisone back in June; (numbers have been up recently 180 and 150)  Eating at hs if can't sleep;  BMI: normal Diet; (DM2)  Breadfast; omelettes; english muffins; sandwich at lunch; eat a lot of tomato sandwich;  Dinner meat and 2 veg/ chicken and fish; limited red meat Eat out 2 to 3 times a week;   Exercise; don't do formal exercise; 12 steps / discussed safety with DM lack of circulation; cuts her own toenails;had blister on outer right foot; is healed now; gave DM education on care of feet;   SAFETY Has fallen x 3; ? Shoes;  2 level home with spouse  Safety reviewed for the home; including removal of clutter; clear paths through the home, eliminating clutter, railing as needed; bathroom safety; community safety; smoke detectors and firearms safety as well as sun protection;  Driving accidents and seatbelt Sun protection Stressors; spouse is 75; minor disability; very mobile  Medication review;  Fall assessment; report several falls over the last few months; states she trips; mostly likely in sandals; recommended strengthening and weights with stretch band and joining the silver sneaker program. States her insurance does cover this Gait assessment; no issues identified   Mobilization and Functional  losses in the last year: none reported; No issue ADL's and IADL's    Urinary or fecal incontinence reviewed; no issues Counseling: Colonoscopy; to fup up: states she thinks she had in Dec 2008/ thinks she had one? Through [facility]; Dr. Henrene Pastor; Outreach to [employer] but no answer;  EKG 12/25/2013 Dexa scan; 12/01/2013 / recommend fup dexa scan in 2years; [place]; vit D and wt bearing exercise Mammogram: Breast cancer with mastectomy on left; 07/0//2016 with right bx due to suspicious finding on Mammogram/ it was ok;  Hearing: not tested today but no issue noted during assessment Ophthalmology exam; apt is scheduled (tbd)   Immunizations Due  Prevnar 13 reacted to pneumonia (23) states arm swelled up;  Flu shot: prefers to take Nov;   Current Care Team reviewed and updated Dr. Chalmers Cater endocrinology/ Endocrinology, Diabetes and Metabolism; [phone]      Objective:     Vitals: BP 130/70 mmHg  Pulse 79  Temp(Src) 98.4 F (36.9 C) (Oral)  Resp 16  Ht 5' 4.5" (1.638 m)  Wt 150 lb (68.04 kg)  BMI 25.36 kg/m2  SpO2 90%  Tobacco History  Smoking status  . Never Smoker   Smokeless tobacco  . Not on file     Counseling given: Yes   Past Medical History  Diagnosis Date  . Malignant neoplasm of breast (female), unspecified site 1993    L breast s/p mastectomy and tamoxifen x 24yrs  . Hyperlipidemia   . Hypertension   . Diabetes mellitus type II   . 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 GERD (gastroesophageal reflux disease)   . Anemia   . Fibromyalgia   . Unspecified hypothyroidism   . Anxiety   . Depression    Past Surgical History  Procedure Laterality Date  . Appendectomy    . Tubal ligation    . Abdominal hysterectomy    . Breast surgery  1993    Left breast surgery/biopsy then reconstruction  . Spine surgery      Spinal Fusion x 2  . Eye surgery      Cataract  . Rotator cuff repair     Family History  Problem  Relation Age of Onset  . Cancer Other     Breast  . Depression Other     Parent  . Arthritis Other     Parent, Grandparent  . Hypertension Other     Grandparent  . Hyperlipidemia Other   . Miscarriages / Stillbirths Other     Grandparent   History  Sexual Activity  . Sexual Activity: Not on file    Outpatient Encounter Prescriptions as of 11/20/2014  Medication Sig  . aspirin 81 MG tablet Take 81 mg by mouth daily.    . calcium carbonate (OS-CAL) 600 MG TABS Take 1,200 mg by mouth daily.    . celecoxib (CELEBREX) 200 MG capsule Take 1 capsule (200 mg total) by mouth daily.  . Choline Fenofibrate 135 MG capsule Take 135 mg by mouth daily.    . DULoxetine (CYMBALTA) 30 MG capsule Take 60 mg prior to dinner and 60 mg prior to bedtime.  Marland Kitchen HYDROcodone-acetaminophen (LORCET) 10-650 MG per tablet Take 1 tablet by mouth as needed.   Marland Kitchen levothyroxine (SYNTHROID, LEVOTHROID) 50 MCG tablet Take 50 mcg by mouth daily before breakfast.  . metFORMIN (GLUCOPHAGE) 500 MG tablet Take 1,000 mg by mouth 2 (two) times daily.    . Multiple Vitamin (MULTIVITAMIN) tablet Take 1 tablet by mouth daily.    . Omega-3 Fatty Acids (FISH OIL) 500 MG CAPS Take 1 capsule by mouth 2 (two) times daily.   Marland Kitchen omeprazole (PRILOSEC) 20 MG capsule TAKE ONE CAPSULE BY MOUTH EVERY DAY  . ONE TOUCH ULTRA TEST test strip Use as directed  . ONETOUCH DELICA LANCETS MISC Use as directed  . rosuvastatin (CRESTOR) 20 MG tablet Take 10 mg by mouth daily.   . valsartan-hydrochlorothiazide (DIOVAN-HCT) 320-25 MG per tablet TAKE 1 TABLET BY MOUTH DAILY.  Marland Kitchen warfarin (COUMADIN) 5 MG tablet Take as directed by coumadin clinic.  . [DISCONTINUED] diazepam (VALIUM) 5 MG tablet Take 1 tablet (5 mg total) by mouth 2 (two) times daily. (Patient not taking: Reported on 11/20/2014)  . [DISCONTINUED] diclofenac sodium (VOLTAREN) 1 % GEL Apply topically 4 (four) times daily as needed.  . [DISCONTINUED] gabapentin (NEURONTIN) 300 MG capsule TAKE ONE  CAPSULE BY MOUTH AT BEDTIME (Patient not taking: Reported on 11/20/2014)  . [DISCONTINUED] oxyCODONE-acetaminophen (PERCOCET) 5-325 MG per tablet Take 1 tablet by mouth every 4 (four) hours as needed for severe pain. (Patient not taking: Reported on 11/20/2014)   No facility-administered encounter medications on file as of 11/20/2014.    Activities of Daily Living No flowsheet data found.  Patient Care Team: Rowe Clack, MD as PCP - General Renella Cunas, MD as Consulting Physician (Cardiology) Irene Shipper, MD as Consulting Physician (Gastroenterology) Unice Bailey, MD as Consulting Physician (Rheumatology) Deneise Lever, MD as Consulting Physician (Pulmonary Disease)  Jacelyn Pi, MD as Consulting Physician (Endocrinology)    Assessment:    Assessment   Today patient counseled on age appropriate routine health concerns for screening and prevention, each reviewed and up to date or declined. (declined flu shot until nov and states she had allergic reaction to PSV 23 and arm swelled)  Labs deferred Dr. Doug Sou.  Risk factors for depression reviewed and negative.ADLs screened and addressed as needed. Functional ability and level of safety reviewed and appropriate. Psych; Mood engaging today; Deferred to Psych as the patient noted she has psych doctor.   Educated on memory loss and did complete the  MMSE: 29/30   Fibromyalgia; States current medical treatment is working and she is not to inhibited in function.  Hyperlipidemia; compliant with med; HDL low and discussed exercise to improve; Blood to be repeated;   FALL PREVENTION Educated on prevention falls due to several recent falls without injury; Exercise, toning and strengthening; Balance exercises; [facility] Wear Comfortable shoes with strap at back; Vision checks to be done; home modifications for safety'  Foot Care Discussed risk for falls and recommended she go to podiatrist for foot care.  She does have a podiatrist and will fup  HEPATITIS SCREEN deferred due to time  TOBACCO/ETOH or other DRUG use was negative  HTN;  In good control today; Watches sugar; compliant with checking BS and does not eat out much due to the sodium content in food.   Diabetes:  Stated she will make apt with Dr. Chalmers Cater if her BS in the am do not stay within her normal range;  [facility];   Barriers to successful management: None noted  Stress; (1-5) no issues verbalized  Exercise Activities and Dietary recommendations    Goals    None     Fall Risk Fall Risk  11/03/2013 09/18/2012  Falls in the past year? No No   Depression Screen PHQ 2/9 Scores 11/03/2013 09/18/2012  PHQ - 2 Score 0 0     Cognitive Testing MMSE - Mini Mental State Exam 11/20/2014  Orientation to time 5  Orientation to Place 5  Registration 3  Attention/ Calculation 5  Recall 2  Language- name 2 objects 2  Language- repeat 1  Language- follow 3 step command 3  Language- read & follow direction 1  Write a sentence 1  Copy design 1  Total score 29    Immunization History  Administered Date(s) Administered  . Influenza Whole 03/21/2007, 12/18/2008, 01/20/2010  . Influenza,inj,Quad PF,36+ Mos 01/22/2013  . Influenza-Unspecified 02/18/2012  . Pneumococcal Polysaccharide-23 03/20/2006, 12/19/2007  . Td 08/27/2008  . Zoster 12/11/2012   Screening Tests Health Maintenance  Topic Date Due  . OPHTHALMOLOGY EXAM  04/15/1950  . PNA vac Low Risk Adult (2 of 2 - PCV13) 12/18/2008  . HEMOGLOBIN A1C  05/06/2014  . INFLUENZA VACCINE  10/19/2014  . FOOT EXAM  11/04/2014  . URINE MICROALBUMIN  11/04/2014  . MAMMOGRAM  09/24/2016  . COLONOSCOPY  03/07/2017  . TETANUS/TDAP  08/28/2018  . DEXA SCAN  Completed  . ZOSTAVAX  Completed      Plan:   Plan to see a podiatrist and return to Dr. Chalmers Cater as needed; [facility]; Will fup on eye exam this year.  During the course of the visit the patient was educated and counseled about the following appropriate  screening and preventive services:   Vaccines to include Pneumoccal, Influenza, Hepatitis B, Td, Zostavax, HCV/ Declined flu and prevnar  Electrocardiogram/ deferred  Cardiovascular Disease/anti coag due to PE hx  Colorectal cancer screening/ ? 2018 but will check with Dr. Blanch [place] office   Bone density screening; 2015   Diabetes screening/ Dr. Chalmers Cater  Glaucoma screening/ annually and scheduled for later this year  Mammography/PAP/ completed and bx on right breast wnl  Nutrition counseling / reviewed for sodium  Patient Instructions (the written plan) was given to the patient.   Wynetta Fines, RN  11/20/2014

## 2014-11-20 NOTE — Assessment & Plan Note (Signed)
Follows with endocrinology and getting labs there next week. Foot exam done today. Advised her to get her toenails trimmed at podiatry as she accidentally cut herself while trying to trim last time. She will think about it. On metformin and ARB and statin.

## 2014-11-20 NOTE — Assessment & Plan Note (Signed)
First in 2008 then again in 2009 while off therapy. Lifelong therapy recommended with bridging for procedures based on risk of procedure.

## 2014-11-20 NOTE — Assessment & Plan Note (Signed)
Gets lipids checked and will bring [place] the labs, no side effects. On crestor 20 mg daily and last LDL 101 (close to goal <100).

## 2014-11-20 NOTE — Progress Notes (Signed)
   Subjective:    Patient ID: Andrea Simmons, female    DOB: [birth year], 74 y.o.   MRN: [id number]  HPI The patient is a 74 YO female coming in for follow up of her need for anticoagulation. She has been on coumadin for many years. Had first PE in 2008 and then again (after stopping anticoagulation in 2009). She is now on lifelong therapy. Denies any problems with bleeding. No dark stools. No nosebleeds.   Review of Systems  Constitutional: Negative for fever, chills, activity change, appetite change and fatigue.  Respiratory: Negative for cough, chest tightness, shortness of breath and wheezing.   Cardiovascular: Negative for chest pain, palpitations and leg swelling.  Gastrointestinal: Negative for nausea, abdominal pain, diarrhea, constipation and abdominal distention.  Musculoskeletal: Negative.   Skin: Negative.   Neurological: Negative.       Objective:   Physical Exam  Constitutional: She is oriented to person, place, and time. She appears well-developed and well-nourished.  HENT:  Head: Normocephalic and atraumatic.  Eyes: EOM are normal.  Neck: Normal range of motion.  Cardiovascular: Normal rate and regular rhythm.   Pulmonary/Chest: Effort normal and breath sounds normal. No respiratory distress. She has no wheezes.  Abdominal: Soft. Bowel sounds are normal.  Neurological: She is alert and oriented to person, place, and time. Coordination normal.  Skin: Skin is warm and dry.   Filed Vitals:   11/20/14 1253  BP: 130/70  Pulse: 79  Temp: 98.4 F (36.9 C)  TempSrc: Oral  Resp: 16  Height: 5' 4.5" (1.638 m)  Weight: 150 lb (68.04 kg)  SpO2: 90%      Assessment & Plan:

## 2014-11-20 NOTE — Assessment & Plan Note (Signed)
Stable on her cymbalta, uses rare pain meds for flare. Refills sent in today.

## 2014-11-24 ENCOUNTER — Other Ambulatory Visit: Payer: Self-pay | Admitting: General Practice

## 2014-11-24 ENCOUNTER — Ambulatory Visit (INDEPENDENT_AMBULATORY_CARE_PROVIDER_SITE_OTHER): Payer: Medicare Other | Admitting: General Practice

## 2014-11-24 DIAGNOSIS — I2699 Other pulmonary embolism without acute cor pulmonale: Secondary | ICD-10-CM | POA: Diagnosis not present

## 2014-11-24 DIAGNOSIS — Z5181 Encounter for therapeutic drug level monitoring: Secondary | ICD-10-CM

## 2014-11-24 LAB — POCT INR: INR: 3

## 2014-11-24 NOTE — Progress Notes (Signed)
I have reviewed and agree with the plan. 

## 2014-11-24 NOTE — Progress Notes (Signed)
Pre visit review using our clinic review tool, if applicable. No additional management support is needed unless otherwise documented below in the visit note. 

## 2014-12-01 ENCOUNTER — Other Ambulatory Visit: Payer: Self-pay | Admitting: Internal Medicine

## 2014-12-09 ENCOUNTER — Telehealth: Payer: Self-pay

## 2014-12-09 NOTE — Telephone Encounter (Signed)
Call rec'd from Ms. Schiefelbein stating that she had rec'd bill from [place] from [facility] [place], stating the AWV was not a covered service; I have reached to [facility] and will call follow up with the patient tomorrow.  Thanked Ms. Forness for letting me know.

## 2014-12-10 NOTE — Telephone Encounter (Signed)
All to Ms. Minichiello; STated that Ar[facility] is reaching out to better understand this denial; will call her back when we get more information but in the meantime, medicare does cover an AWV.  Would not expect her to be billed for this at this juncture but will call her back when we get more information.

## 2014-12-10 NOTE — Telephone Encounter (Signed)
Call to the patient to discuss AWV and had to leave a VM to call me at [phone]   Spoke to [place] in coding this afternoon. Dawn stated that it may be that this AWV was billed under Dr. Doug Sou and Dr .Asa Lente is listed as the patient's PCP. The patient needs to call the number on the back of her letter to appeal the denial ; because of primary care doctor should be transitioned to [facility];   She should also let Billing know that she is appealing this decision and tell them why.  Did not leave the above message, but asked the patient to call back; If she calls back in my absence tomorrow, please give her the above information

## 2014-12-22 ENCOUNTER — Ambulatory Visit (INDEPENDENT_AMBULATORY_CARE_PROVIDER_SITE_OTHER): Payer: Medicare Other | Admitting: General Practice

## 2014-12-22 DIAGNOSIS — Z5181 Encounter for therapeutic drug level monitoring: Secondary | ICD-10-CM

## 2014-12-22 DIAGNOSIS — I2699 Other pulmonary embolism without acute cor pulmonale: Secondary | ICD-10-CM

## 2014-12-22 LAB — POCT INR: INR: 2.9

## 2014-12-22 NOTE — Progress Notes (Signed)
Pre visit review using our clinic review tool, if applicable. No additional management support is needed unless otherwise documented below in the visit note. 

## 2014-12-23 ENCOUNTER — Encounter: Payer: Self-pay | Admitting: *Deleted

## 2014-12-23 NOTE — Progress Notes (Signed)
I have reviewed and agree with the plan. 

## 2014-12-24 NOTE — Progress Notes (Signed)
I have reviewed and agree with the plan. 

## 2014-12-25 ENCOUNTER — Ambulatory Visit: Payer: Medicare Other | Admitting: Cardiology

## 2015-01-19 ENCOUNTER — Ambulatory Visit (INDEPENDENT_AMBULATORY_CARE_PROVIDER_SITE_OTHER): Payer: Medicare Other | Admitting: General Practice

## 2015-01-19 DIAGNOSIS — Z5181 Encounter for therapeutic drug level monitoring: Secondary | ICD-10-CM | POA: Diagnosis not present

## 2015-01-19 DIAGNOSIS — Z23 Encounter for immunization: Secondary | ICD-10-CM | POA: Diagnosis not present

## 2015-01-19 DIAGNOSIS — I2699 Other pulmonary embolism without acute cor pulmonale: Secondary | ICD-10-CM

## 2015-01-19 LAB — POCT INR: INR: 2.4

## 2015-01-19 NOTE — Progress Notes (Signed)
Pre visit review using our clinic review tool, if applicable. No additional management support is needed unless otherwise documented below in the visit note. 

## 2015-01-19 NOTE — Progress Notes (Signed)
I have reviewed and agree with the plan. 

## 2015-01-25 ENCOUNTER — Other Ambulatory Visit: Payer: Self-pay

## 2015-01-25 MED ORDER — CELECOXIB 200 MG PO CAPS: 200.0000 mg | ORAL_CAPSULE | Freq: Every day | ORAL | Status: DC

## 2015-02-22 ENCOUNTER — Other Ambulatory Visit: Payer: Self-pay | Admitting: Internal Medicine

## 2015-02-22 DIAGNOSIS — R921 Mammographic calcification found on diagnostic imaging of breast: Secondary | ICD-10-CM

## 2015-03-02 ENCOUNTER — Ambulatory Visit (INDEPENDENT_AMBULATORY_CARE_PROVIDER_SITE_OTHER): Payer: Medicare Other | Admitting: General Practice

## 2015-03-02 DIAGNOSIS — I2699 Other pulmonary embolism without acute cor pulmonale: Secondary | ICD-10-CM | POA: Diagnosis not present

## 2015-03-02 DIAGNOSIS — Z5181 Encounter for therapeutic drug level monitoring: Secondary | ICD-10-CM | POA: Diagnosis not present

## 2015-03-02 LAB — POCT INR: INR: 2.9

## 2015-03-02 NOTE — Progress Notes (Signed)
I have reviewed and agree with the plan. 

## 2015-03-02 NOTE — Progress Notes (Signed)
Pre visit review using our clinic review tool, if applicable. No additional management support is needed unless otherwise documented below in the visit note. 

## 2015-03-25 ENCOUNTER — Ambulatory Visit (HOSPITAL_COMMUNITY)
Admission: RE | Admit: 2015-03-25 | Discharge: 2015-03-25 | Disposition: A | Discharge status: Home / Self Care | Payer: Medicare Other | Source: Other Acute Inpatient Hospital | Attending: Emergency Medicine | Admitting: Emergency Medicine

## 2015-03-25 ENCOUNTER — Other Ambulatory Visit: Payer: Self-pay | Admitting: Anesthesiology

## 2015-03-25 ENCOUNTER — Ambulatory Visit (HOSPITAL_COMMUNITY)
Admission: RE | Admit: 2015-03-25 | Discharge: 2015-03-25 | Disposition: A | Discharge status: Home / Self Care | Payer: Medicare Other | Source: Ambulatory Visit | Attending: Anesthesiology | Admitting: Anesthesiology

## 2015-03-25 DIAGNOSIS — M4316 Spondylolisthesis, lumbar region: Secondary | ICD-10-CM | POA: Insufficient documentation

## 2015-03-25 DIAGNOSIS — M4686 Other specified inflammatory spondylopathies, lumbar region: Secondary | ICD-10-CM | POA: Diagnosis not present

## 2015-03-25 DIAGNOSIS — M545 Low back pain: Secondary | ICD-10-CM | POA: Insufficient documentation

## 2015-03-25 DIAGNOSIS — R52 Pain, unspecified: Secondary | ICD-10-CM

## 2015-03-25 DIAGNOSIS — G8929 Other chronic pain: Secondary | ICD-10-CM | POA: Insufficient documentation

## 2015-04-05 ENCOUNTER — Other Ambulatory Visit: Payer: Self-pay | Admitting: Internal Medicine

## 2015-04-06 ENCOUNTER — Ambulatory Visit
Admission: RE | Admit: 2015-04-06 | Discharge: 2015-04-06 | Disposition: A | Discharge status: Home / Self Care | Payer: Medicare Other | Source: Ambulatory Visit | Attending: Internal Medicine | Admitting: Internal Medicine

## 2015-04-06 DIAGNOSIS — R921 Mammographic calcification found on diagnostic imaging of breast: Secondary | ICD-10-CM

## 2015-04-13 ENCOUNTER — Ambulatory Visit: Payer: Medicare Other

## 2015-04-16 ENCOUNTER — Ambulatory Visit (INDEPENDENT_AMBULATORY_CARE_PROVIDER_SITE_OTHER): Payer: Medicare Other | Admitting: General Practice

## 2015-04-16 DIAGNOSIS — I2699 Other pulmonary embolism without acute cor pulmonale: Secondary | ICD-10-CM

## 2015-04-16 DIAGNOSIS — Z5181 Encounter for therapeutic drug level monitoring: Secondary | ICD-10-CM

## 2015-04-16 LAB — POCT INR: INR: 3

## 2015-04-16 NOTE — Progress Notes (Signed)
I have reviewed and agree with the plan. 

## 2015-04-16 NOTE — Progress Notes (Signed)
Pre visit review using our clinic review tool, if applicable. No additional management support is needed unless otherwise documented below in the visit note. 

## 2015-05-28 ENCOUNTER — Ambulatory Visit (INDEPENDENT_AMBULATORY_CARE_PROVIDER_SITE_OTHER): Payer: Medicare Other | Admitting: General Practice

## 2015-05-28 DIAGNOSIS — Z5181 Encounter for therapeutic drug level monitoring: Secondary | ICD-10-CM

## 2015-05-28 DIAGNOSIS — I2699 Other pulmonary embolism without acute cor pulmonale: Secondary | ICD-10-CM

## 2015-05-28 LAB — POCT INR: INR: 2.5

## 2015-05-28 NOTE — Progress Notes (Signed)
Pre visit review using our clinic review tool, if applicable. No additional management support is needed unless otherwise documented below in the visit note. 

## 2015-05-28 NOTE — Progress Notes (Signed)
I have reviewed and agree with the plan. 

## 2015-07-04 ENCOUNTER — Encounter (HOSPITAL_COMMUNITY): Payer: Self-pay | Admitting: Nurse Practitioner

## 2015-07-04 ENCOUNTER — Ambulatory Visit (HOSPITAL_COMMUNITY)
Admission: EM | Admit: 2015-07-04 | Discharge: 2015-07-04 | Disposition: A | Discharge status: Home / Self Care | Payer: Medicare Other | Attending: Family Medicine | Admitting: Family Medicine

## 2015-07-04 DIAGNOSIS — L237 Allergic contact dermatitis due to plants, except food: Secondary | ICD-10-CM

## 2015-07-04 MED ORDER — METHYLPREDNISOLONE SODIUM SUCC 125 MG IJ SOLR
125.0000 mg | Freq: Once | INTRAMUSCULAR | Status: AC
  Administered 2015-07-04: 125 mg via INTRAMUSCULAR

## 2015-07-04 MED ORDER — METHYLPREDNISOLONE SODIUM SUCC 125 MG IJ SOLR
INTRAMUSCULAR | Status: AC
  Filled 2015-07-04: qty 2

## 2015-07-04 NOTE — Discharge Instructions (Signed)
Contact Dermatitis Dermatitis is redness, soreness, and swelling (inflammation) of the skin. Contact dermatitis is a reaction to certain substances that touch the skin. There are two types of contact dermatitis:   Irritant contact dermatitis. This type is caused by something that irritates your skin, such as dry hands from washing them too much. This type does not require previous exposure to the substance for a reaction to occur. This type is more common.  Allergic contact dermatitis. This type is caused by a substance that you are allergic to, such as a nickel allergy or poison ivy. This type only occurs if you have been exposed to the substance (allergen) before. Upon a repeat exposure, your body reacts to the substance. This type is less common. CAUSES  Many different substances can cause contact dermatitis. Irritant contact dermatitis is most commonly caused by exposure to:   Makeup.   Soaps.   Detergents.   Bleaches.   Acids.   Metal salts, such as nickel.  Allergic contact dermatitis is most commonly caused by exposure to:   Poisonous plants.   Chemicals.   Jewelry.   Latex.   Medicines.   Preservatives in products, such as clothing.  RISK FACTORS This condition is more likely to develop in:   People who have jobs that expose them to irritants or allergens.  People who have certain medical conditions, such as asthma or eczema.  SYMPTOMS  Symptoms of this condition may occur anywhere on your body where the irritant has touched you or is touched by you. Symptoms include:  Dryness or flaking.   Redness.   Cracks.   Itching.   Pain or a burning feeling.   Blisters.  Drainage of small amounts of blood or clear fluid from skin cracks. With allergic contact dermatitis, there may also be swelling in areas such as the eyelids, mouth, or genitals.  DIAGNOSIS  This condition is diagnosed with a medical history and physical exam. A patch skin test  may be performed to help determine the cause. If the condition is related to your job, you may need to see an occupational medicine specialist. TREATMENT Treatment for this condition includes figuring out what caused the reaction and protecting your skin from further contact. Treatment may also include:   Steroid creams or ointments. Oral steroid medicines may be needed in more severe cases.  Antibiotics or antibacterial ointments, if a skin infection is present.  Antihistamine lotion or an antihistamine taken by mouth to ease itching.  A bandage (dressing). HOME CARE INSTRUCTIONS Skin Care  Moisturize your skin as needed.   Apply cool compresses to the affected areas.  Try taking a bath with:  Epsom salts. Follow the instructions on the packaging. You can get these at your local pharmacy or grocery store.  Baking soda. Pour a small amount into the bath as directed by your health care provider.  Colloidal oatmeal. Follow the instructions on the packaging. You can get this at your local pharmacy or grocery store.  Try applying baking soda paste to your skin. Stir water into baking soda until it reaches a paste-like consistency.  Do not scratch your skin.  Bathe less frequently, such as every other day.  Bathe in lukewarm water. Avoid using hot water. Medicines  Take or apply over-the-counter and prescription medicines only as told by your health care provider.   If you were prescribed an antibiotic medicine, take or apply your antibiotic as told by your health care provider. Do not stop using the   antibiotic even if your condition starts to improve. General Instructions  Keep all follow-up visits as told by your health care provider. This is important.  Avoid the substance that caused your reaction. If you do not know what caused it, keep a journal to try to track what caused it. Write down:  What you eat.  What cosmetic products you use.  What you drink.  What  you wear in the affected area. This includes jewelry.  If you were given a dressing, take care of it as told by your health care provider. This includes when to change and remove it. SEEK MEDICAL CARE IF:   Your condition does not improve with treatment.  Your condition gets worse.  You have signs of infection such as swelling, tenderness, redness, soreness, or warmth in the affected area.  You have a fever.  You have new symptoms. SEEK IMMEDIATE MEDICAL CARE IF:   You have a severe headache, neck pain, or neck stiffness.  You vomit.  You feel very sleepy.  You notice red streaks coming from the affected area.  Your bone or joint underneath the affected area becomes painful after the skin has healed.  The affected area turns darker.  You have difficulty breathing.   This information is not intended to replace advice given to you by your health care provider. Make sure you discuss any questions you have with your health care provider.   Document Released: 03/03/2000 Document Revised: 11/25/2014 Document Reviewed: 07/22/2014 Elsevier Interactive Patient Education 2016 [employer]  

## 2015-07-04 NOTE — ED Notes (Signed)
She c/o 3 day history itchy, painful rash to BUE/BLE. Onset after working in yard around [place]. She has been applying OTC topical cream which was drying up the rash until today when she woke with rash spreading to trunk, posterior neck.

## 2015-07-04 NOTE — ED Provider Notes (Signed)
CSN: [id number]     Arrival date & time 07/04/15  1607 History   First MD Initiated Contact with Patient 07/04/15 1628     Chief Complaint  Patient presents with  . Rash   (Consider location/radiation/quality/duration/timing/severity/associated sxs/prior Treatment) HPI Pt was doing yard work, came into contact with poison oak. She has rash noted both arms, and right side. Has used calamine lotion with some relief. Rash started Thursday. Itching is more intense. Requesting steroid injection.  Past Medical History  Diagnosis Date  . Malignant neoplasm of breast (female), unspecified site 1993    L breast s/p mastectomy and tamoxifen x 33yrs  . Hyperlipidemia   . Hypertension   . Diabetes mellitus type II   . 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 GERD (gastroesophageal reflux disease)   . Anemia   . Fibromyalgia   . Unspecified hypothyroidism   . Anxiety   . Depression    Past Surgical History  Procedure Laterality Date  . Appendectomy    . Tubal ligation    . Total abdominal hysterectomy w/ bilateral salpingoophorectomy    . Breast biopsy Left 1993  . Spinal fusion       x 2  . Cataract extraction    . Rotator cuff repair    . Breast reconstruction Left    Family History  Problem Relation Age of Onset  . Breast cancer Other   . Depression Other     Parent  . Arthritis Other     Parent, Grandparent  . Hypertension Other     Grandparent  . Hyperlipidemia Other     [place]  . Miscarriages / Stillbirths Other     Grandparent  . Stroke Other     [facility]   Social History  Substance Use Topics  . Smoking status: Never Smoker   . Smokeless tobacco: None  . Alcohol Use: No   OB History    No data available     Review of Systems rash Allergies  Anaprox; Lipitor; Meperidine hcl; and Morphine  Home Medications   Prior to Admission medications   Medication Sig Start Date End Date Taking? Authorizing Provider  aspirin 81  MG tablet Take 81 mg by mouth daily.      Historical Provider, MD  calcium carbonate (OS-CAL) 600 MG TABS Take 1,200 mg by mouth daily.      Historical Provider, MD  celecoxib (CELEBREX) 200 MG capsule Take 1 capsule (200 mg total) by mouth daily. Must establish with new provider for additional refills. 01/25/15   Rowe Clack, MD  Choline Fenofibrate 135 MG capsule Take 135 mg by mouth daily.      Historical Provider, MD  DULoxetine (CYMBALTA) 30 MG capsule Take 60 mg prior to dinner and 60 mg prior to bedtime. 02/08/11   Rowe Clack, MD  HYDROcodone-acetaminophen (LORCET) 10-650 MG per tablet Take 1 tablet by mouth as needed.     Historical Provider, MD  levothyroxine (SYNTHROID, LEVOTHROID) 50 MCG tablet Take 50 mcg by mouth daily before breakfast.    Historical Provider, MD  metFORMIN (GLUCOPHAGE) 500 MG tablet Take 1,000 mg by mouth 2 (two) times daily.      Historical Provider, MD  Multiple Vitamin (MULTIVITAMIN) tablet Take 1 tablet by mouth daily.      Historical Provider, MD  Omega-3 Fatty Acids (FISH OIL) 500 MG CAPS Take 1 capsule by mouth 2 (two) times daily.  Historical Provider, MD  omeprazole (PRILOSEC) 20 MG capsule Take 1 capsule (20 mg total) by mouth daily. 12/01/14   Rowe Clack, MD  ONE TOUCH ULTRA TEST test strip Use as directed 10/26/10   Historical Provider, MD  Jonetta Speak LANCETS MISC Use as directed 10/26/10   Historical Provider, MD  rosuvastatin (CRESTOR) 20 MG tablet Take 10 mg by mouth daily.     Historical Provider, MD  valsartan-hydrochlorothiazide (DIOVAN-HCT) 320-25 MG tablet TAKE 1 TABLET BY MOUTH DAILY. 04/06/15   Hoyt Koch, MD  warfarin (COUMADIN) 5 MG tablet Take as directed by coumadin clinic. 11/02/14   Rowe Clack, MD   Meds Ordered and Administered this Visit   Medications  methylPREDNISolone sodium succinate (SOLU-MEDROL) 125 mg/2 mL injection 125 mg (125 mg Intramuscular Given 07/04/15 1705)    BP 158/84 mmHg   Pulse 72  Temp(Src) 98.4 F (36.9 C) (Oral)  Resp 18  SpO2 98% No data found.   Physical Exam NURSES NOTES AND VITAL SIGNS REVIEWED. CONSTITUTIONAL: Well developed, well nourished, no acute distress HEENT: normocephalic, atraumatic EYES: Conjunctiva normal NECK:normal ROM, supple, no adenopathy PULMONARY:No respiratory distress, normal effort MUSCULOSKELETAL: Normal ROM of all extremities,  SKIN: warm and dry rash noted both arms, and mostly right side.  PSYCHIATRIC: Mood and affect, behavior are normal  ED Course  Procedures (including critical care time)  Labs Review Labs Reviewed - No data to display  Imaging Review No results found.   Visual Acuity Review  Right Eye Distance:   Left Eye Distance:   Bilateral Distance:    Right Eye Near:   Left Eye Near:    Bilateral Near:         MDM   1. Allergic dermatitis due to poison oak     Patient is reassured that there are no issues that require transfer to higher level of care at this time or additional tests. Patient is advised to continue home symptomatic treatment. Patient is advised that if there are new or worsening symptoms to attend the emergency department, contact primary care provider, or return to UC. Instructions of care provided discharged home in stable condition.    THIS NOTE WAS GENERATED USING A VOICE RECOGNITION SOFTWARE PROGRAM. ALL REASONABLE EFFORTS  WERE MADE TO PROOFREAD THIS DOCUMENT FOR ACCURACY.  I have verbally reviewed the discharge instructions with the patient. A printed AVS was given to the patient.  All questions were answered prior to discharge.      Konrad Felix, [place] 07/04/15 1747

## 2015-07-09 ENCOUNTER — Ambulatory Visit (INDEPENDENT_AMBULATORY_CARE_PROVIDER_SITE_OTHER): Payer: Medicare Other | Admitting: General Practice

## 2015-07-09 DIAGNOSIS — I2699 Other pulmonary embolism without acute cor pulmonale: Secondary | ICD-10-CM

## 2015-07-09 DIAGNOSIS — Z5181 Encounter for therapeutic drug level monitoring: Secondary | ICD-10-CM

## 2015-07-09 LAB — POCT INR: INR: 3.3

## 2015-07-09 NOTE — Progress Notes (Signed)
I have reviewed and agree with the plan. 

## 2015-07-09 NOTE — Progress Notes (Signed)
Pre visit review using our clinic review tool, if applicable. No additional management support is needed unless otherwise documented below in the visit note. 

## 2015-08-06 ENCOUNTER — Ambulatory Visit: Payer: Medicare Other

## 2015-08-09 ENCOUNTER — Ambulatory Visit (INDEPENDENT_AMBULATORY_CARE_PROVIDER_SITE_OTHER): Payer: Medicare Other | Admitting: General Practice

## 2015-08-09 DIAGNOSIS — I2699 Other pulmonary embolism without acute cor pulmonale: Secondary | ICD-10-CM

## 2015-08-09 DIAGNOSIS — Z5181 Encounter for therapeutic drug level monitoring: Secondary | ICD-10-CM

## 2015-08-09 LAB — POCT INR: INR: 2.1

## 2015-08-09 NOTE — Progress Notes (Signed)
Pre visit review using our clinic review tool, if applicable. No additional management support is needed unless otherwise documented below in the visit note. 

## 2015-08-10 NOTE — Progress Notes (Signed)
I agree with this plan.

## 2015-09-06 ENCOUNTER — Ambulatory Visit (INDEPENDENT_AMBULATORY_CARE_PROVIDER_SITE_OTHER): Payer: Medicare Other | Admitting: General Practice

## 2015-09-06 DIAGNOSIS — I2699 Other pulmonary embolism without acute cor pulmonale: Secondary | ICD-10-CM | POA: Diagnosis not present

## 2015-09-06 DIAGNOSIS — Z5181 Encounter for therapeutic drug level monitoring: Secondary | ICD-10-CM | POA: Diagnosis not present

## 2015-09-06 LAB — POCT INR: INR: 3.1

## 2015-09-06 NOTE — Progress Notes (Signed)
Pre visit review using our clinic review tool, if applicable. No additional management support is needed unless otherwise documented below in the visit note. 

## 2015-09-07 NOTE — Progress Notes (Signed)
I agree with this plan.

## 2015-10-04 ENCOUNTER — Ambulatory Visit (INDEPENDENT_AMBULATORY_CARE_PROVIDER_SITE_OTHER): Payer: Medicare Other | Admitting: General Practice

## 2015-10-04 DIAGNOSIS — Z5181 Encounter for therapeutic drug level monitoring: Secondary | ICD-10-CM | POA: Diagnosis not present

## 2015-10-04 DIAGNOSIS — I2699 Other pulmonary embolism without acute cor pulmonale: Secondary | ICD-10-CM | POA: Diagnosis not present

## 2015-10-04 LAB — POCT INR: INR: 2.3

## 2015-10-04 NOTE — Progress Notes (Signed)
I agree with this plan.

## 2015-10-04 NOTE — Progress Notes (Signed)
Pre visit review using our clinic review tool, if applicable. No additional management support is needed unless otherwise documented below in the visit note. 

## 2015-11-01 ENCOUNTER — Ambulatory Visit (INDEPENDENT_AMBULATORY_CARE_PROVIDER_SITE_OTHER): Payer: Medicare Other | Admitting: General Practice

## 2015-11-01 DIAGNOSIS — I2699 Other pulmonary embolism without acute cor pulmonale: Secondary | ICD-10-CM

## 2015-11-01 DIAGNOSIS — Z5181 Encounter for therapeutic drug level monitoring: Secondary | ICD-10-CM

## 2015-11-01 LAB — POCT INR: INR: 2.6

## 2015-11-29 ENCOUNTER — Ambulatory Visit (INDEPENDENT_AMBULATORY_CARE_PROVIDER_SITE_OTHER): Payer: Medicare Other | Admitting: General Practice

## 2015-11-29 DIAGNOSIS — Z5181 Encounter for therapeutic drug level monitoring: Secondary | ICD-10-CM

## 2015-11-29 DIAGNOSIS — I2699 Other pulmonary embolism without acute cor pulmonale: Secondary | ICD-10-CM

## 2015-11-29 LAB — POCT INR: INR: 2.8

## 2015-11-30 ENCOUNTER — Other Ambulatory Visit: Payer: Self-pay | Admitting: Internal Medicine

## 2015-11-30 NOTE — Progress Notes (Signed)
I agree with this plan.

## 2015-12-02 ENCOUNTER — Telehealth: Payer: Self-pay | Admitting: Internal Medicine

## 2015-12-02 NOTE — Telephone Encounter (Signed)
Pt aware.

## 2015-12-02 NOTE — Telephone Encounter (Signed)
Fine with me

## 2015-12-02 NOTE — Telephone Encounter (Signed)
Pt request to transfer from [place] to [place]. Please advise, pt request to ask first before she look elsewhere.

## 2015-12-02 NOTE — Telephone Encounter (Signed)
I am not accepting any intra-office transfers at this time

## 2015-12-09 ENCOUNTER — Telehealth: Payer: Self-pay | Admitting: Emergency Medicine

## 2015-12-09 NOTE — Telephone Encounter (Signed)
Ok to transfer. 

## 2015-12-09 NOTE — Telephone Encounter (Signed)
Pt aware, scheduled 04/20/16 for NPT appointment

## 2015-12-09 NOTE — Telephone Encounter (Signed)
Patient request to transfer from [place] to [place]. Last seen by [facility] in 11/20/2014. Please advise of the transfer

## 2015-12-09 NOTE — Telephone Encounter (Signed)
Fine with me

## 2015-12-15 ENCOUNTER — Ambulatory Visit (INDEPENDENT_AMBULATORY_CARE_PROVIDER_SITE_OTHER): Payer: Medicare Other | Admitting: Podiatry

## 2015-12-15 ENCOUNTER — Encounter: Payer: Self-pay | Admitting: Podiatry

## 2015-12-15 VITALS — BP 163/78 | HR 77 | Resp 18

## 2015-12-15 DIAGNOSIS — E119 Type 2 diabetes mellitus without complications: Secondary | ICD-10-CM

## 2015-12-15 LAB — HM DIABETES FOOT EXAM

## 2015-12-15 NOTE — Progress Notes (Signed)
   Subjective:    Patient ID: Andrea Simmons, female    DOB: Jan 18, 1941, 75 y.o.   MRN: [id number]  HPI    As patient presents today requesting a diabetic foot examination and would like information about diabetic shoes. Patient is diabetic for multiple years and denies any history of skin ulceration, claudication or amputation. Patient describes an infection in her left hallux nail area treated with antibiotics approximately 5 weeks ago with the symptoms resolving. She mentions some discomfort in the MPJ area when walking and standing, however, the symptoms do improve when she wears a standard shoe. Patient is able to wear a standard shoe without difficulty patient relates history of pain management resulting from back pain  Patient has history of pulmonary embolus and maintains anticoagulant therapy  Review of Systems  HENT: Positive for sinus pressure.   Eyes: Positive for itching.  Gastrointestinal:       Bloating  Endocrine:       Excessive thirst  Musculoskeletal: Positive for back pain.       Joint and back pain  Skin:       Change in nails  Hematological: Bruises/bleeds easily.  All other systems reviewed and are negative.      Objective:   Physical Exam   Pleasant orientated 3  Vascular: No calf edema or calf tenderness bilaterally No peripheral edema bilaterally DP pulses 2/4 right and 1/4 left DP pulses 2/4 bilaterally Capillary reflex immediate bilaterally  Dermatological: No open skin lesions bilaterally No skin lesions bilaterally  Neurological: Sensation to 10 g monofilament wire intact 5/5 bilaterally Vibratory sensation reactive bilaterally Ankle reflex equal and reactive bilaterally  Musculoskeletal: HAV left Bunionettes bilaterally Manual motor testing: Dorsi flexion, plantar flexion, inversion, eversion 5/5 bilaterally Base of first tarsal cuneiform prominent bilaterally and not tender to palpation        Assessment & Plan:    Assessment: Satisfactory neurovascular status Protective sensation intact bilaterally  Plan: I reviewed the results of exam with patient today. As patient has a history of tolerating standard shoes and has no history of open wounds or significant deformity that made standard shoe wearing difficult I am recommending that she wears a standard shoe with a low heel and a square toe box.  Reappoint yearly or at patient's request

## 2015-12-15 NOTE — Patient Instructions (Signed)
Diabetes and Foot Care Diabetes may cause you to have problems because of poor blood supply (circulation) to your feet and legs. This may cause the skin on your feet to become thinner, break easier, and heal more slowly. Your skin may become dry, and the skin may peel and crack. You may also have nerve damage in your legs and feet causing decreased feeling in them. You may not notice minor injuries to your feet that could lead to infections or more serious problems. Taking care of your feet is one of the most important things you can do for yourself.  HOME CARE INSTRUCTIONS  Wear shoes at all times, even in the house. Do not go barefoot. Bare feet are easily injured.  Check your feet daily for blisters, cuts, and redness. If you cannot see the bottom of your feet, use a mirror or ask someone for help.  Wash your feet with warm water (do not use hot water) and mild soap. Then pat your feet and the areas between your toes until they are completely dry. Do not soak your feet as this can dry your skin.  Apply a moisturizing lotion or petroleum jelly (that does not contain alcohol and is unscented) to the skin on your feet and to dry, brittle toenails. Do not apply lotion between your toes.  Trim your toenails straight across. Do not dig under them or around the cuticle. File the edges of your nails with an emery board or nail file.  Do not cut corns or calluses or try to remove them with medicine.  Wear clean socks or stockings every day. Make sure they are not too tight. Do not wear knee-high stockings since they may decrease blood flow to your legs.  Wear shoes that fit properly and have enough cushioning. To break in new shoes, wear them for just a few hours a day. This prevents you from injuring your feet. Always look in your shoes before you put them on to be sure there are no objects inside.  Do not cross your legs. This may decrease the blood flow to your feet.  If you find a minor scrape,  cut, or break in the skin on your feet, keep it and the skin around it clean and dry. These areas may be cleansed with mild soap and water. Do not cleanse the area with peroxide, alcohol, or iodine.  When you remove an adhesive bandage, be sure not to damage the skin around it.  If you have a wound, look at it several times a day to make sure it is healing.  Do not use heating pads or hot water bottles. They may burn your skin. If you have lost feeling in your feet or legs, you may not know it is happening until it is too late.  Make sure your health care provider performs a complete foot exam at least annually or more often if you have foot problems. Report any cuts, sores, or bruises to your health care provider immediately. SEEK MEDICAL CARE IF:   You have an injury that is not healing.  You have cuts or breaks in the skin.  You have an ingrown nail.  You notice redness on your legs or feet.  You feel burning or tingling in your legs or feet.  You have pain or cramps in your legs and feet.  Your legs or feet are numb.  Your feet always feel cold. SEEK IMMEDIATE MEDICAL CARE IF:   There is increasing redness,   swelling, or pain in or around a wound.  There is a red line that goes up your leg.  Pus is coming from a wound.  You develop a fever or as directed by your health care provider.  You notice a bad smell coming from an ulcer or wound.   This information is not intended to replace advice given to you by your health care provider. Make sure you discuss any questions you have with your health care provider.   Document Released: 03/03/2000 Document Revised: 11/06/2012 Document Reviewed: 08/13/2012 Elsevier Interactive Patient Education 2016 Elsevier Inc.  

## 2015-12-20 ENCOUNTER — Other Ambulatory Visit: Payer: Self-pay | Admitting: General Practice

## 2015-12-20 ENCOUNTER — Other Ambulatory Visit: Payer: Self-pay | Admitting: Internal Medicine

## 2015-12-27 ENCOUNTER — Ambulatory Visit (INDEPENDENT_AMBULATORY_CARE_PROVIDER_SITE_OTHER): Payer: Medicare Other | Admitting: General Practice

## 2015-12-27 DIAGNOSIS — Z5181 Encounter for therapeutic drug level monitoring: Secondary | ICD-10-CM

## 2015-12-27 DIAGNOSIS — I2699 Other pulmonary embolism without acute cor pulmonale: Secondary | ICD-10-CM

## 2015-12-27 LAB — POCT INR: INR: 3.1

## 2015-12-27 NOTE — Progress Notes (Signed)
I agree with this plan.

## 2015-12-30 ENCOUNTER — Other Ambulatory Visit: Payer: Self-pay | Admitting: Internal Medicine

## 2016-01-24 ENCOUNTER — Ambulatory Visit (INDEPENDENT_AMBULATORY_CARE_PROVIDER_SITE_OTHER): Payer: Medicare Other | Admitting: General Practice

## 2016-01-24 DIAGNOSIS — I2699 Other pulmonary embolism without acute cor pulmonale: Secondary | ICD-10-CM

## 2016-01-24 DIAGNOSIS — Z5181 Encounter for therapeutic drug level monitoring: Secondary | ICD-10-CM | POA: Diagnosis not present

## 2016-01-24 LAB — POCT INR: INR: 2.9

## 2016-01-24 NOTE — Progress Notes (Signed)
I agree with this plan.

## 2016-01-24 NOTE — Patient Instructions (Signed)
Pre visit review using our clinic review tool, if applicable. No additional management support is needed unless otherwise documented below in the visit note. 

## 2016-02-13 ENCOUNTER — Other Ambulatory Visit: Payer: Self-pay | Admitting: Internal Medicine

## 2016-02-18 ENCOUNTER — Other Ambulatory Visit: Payer: Self-pay | Admitting: Internal Medicine

## 2016-02-19 ENCOUNTER — Other Ambulatory Visit: Payer: Self-pay | Admitting: Family Medicine

## 2016-02-21 ENCOUNTER — Ambulatory Visit (INDEPENDENT_AMBULATORY_CARE_PROVIDER_SITE_OTHER): Payer: Medicare Other | Admitting: General Practice

## 2016-02-21 ENCOUNTER — Ambulatory Visit: Payer: Medicare Other

## 2016-02-21 DIAGNOSIS — I2699 Other pulmonary embolism without acute cor pulmonale: Secondary | ICD-10-CM

## 2016-02-21 DIAGNOSIS — Z5181 Encounter for therapeutic drug level monitoring: Secondary | ICD-10-CM | POA: Diagnosis not present

## 2016-02-21 DIAGNOSIS — Z23 Encounter for immunization: Secondary | ICD-10-CM

## 2016-02-21 LAB — POCT INR: INR: 2.8

## 2016-02-21 NOTE — Progress Notes (Signed)
I agree with this plan.

## 2016-02-21 NOTE — Patient Instructions (Signed)
Pre visit review using our clinic review tool, if applicable. No additional management support is needed unless otherwise documented below in the visit note. 

## 2016-03-27 ENCOUNTER — Ambulatory Visit (INDEPENDENT_AMBULATORY_CARE_PROVIDER_SITE_OTHER): Payer: BC Managed Care – PPO | Admitting: General Practice

## 2016-03-27 DIAGNOSIS — I2699 Other pulmonary embolism without acute cor pulmonale: Secondary | ICD-10-CM

## 2016-03-27 DIAGNOSIS — Z5181 Encounter for therapeutic drug level monitoring: Secondary | ICD-10-CM | POA: Diagnosis not present

## 2016-03-27 LAB — POCT INR: INR: 2.3

## 2016-03-28 NOTE — Progress Notes (Signed)
I agree with this plan.

## 2016-04-20 ENCOUNTER — Ambulatory Visit (INDEPENDENT_AMBULATORY_CARE_PROVIDER_SITE_OTHER): Payer: Medicare Other | Admitting: Family Medicine

## 2016-04-20 ENCOUNTER — Encounter: Payer: Self-pay | Admitting: Family Medicine

## 2016-04-20 VITALS — BP 137/89 | HR 80 | Temp 98.1°F | Resp 17 | Ht 64.0 in | Wt 149.0 lb

## 2016-04-20 DIAGNOSIS — E119 Type 2 diabetes mellitus without complications: Secondary | ICD-10-CM | POA: Diagnosis not present

## 2016-04-20 DIAGNOSIS — Z23 Encounter for immunization: Secondary | ICD-10-CM

## 2016-04-20 DIAGNOSIS — Z780 Asymptomatic menopausal state: Secondary | ICD-10-CM

## 2016-04-20 DIAGNOSIS — I2699 Other pulmonary embolism without acute cor pulmonale: Secondary | ICD-10-CM | POA: Diagnosis not present

## 2016-04-20 DIAGNOSIS — E039 Hypothyroidism, unspecified: Secondary | ICD-10-CM

## 2016-04-20 DIAGNOSIS — E785 Hyperlipidemia, unspecified: Secondary | ICD-10-CM

## 2016-04-20 DIAGNOSIS — I10 Essential (primary) hypertension: Secondary | ICD-10-CM

## 2016-04-20 DIAGNOSIS — M797 Fibromyalgia: Secondary | ICD-10-CM

## 2016-04-20 LAB — HEMOGLOBIN A1C: HEMOGLOBIN A1C: 6.4 % (ref 4.6–6.5)

## 2016-04-20 LAB — HEPATIC FUNCTION PANEL
ALT: 19 U/L (ref 0–35)
AST: 35 U/L (ref 0–37)
Albumin: 4.5 g/dL (ref 3.5–5.2)
Alkaline Phosphatase: 40 U/L (ref 39–117)
BILIRUBIN DIRECT: 0.1 mg/dL (ref 0.0–0.3)
TOTAL PROTEIN: 6.8 g/dL (ref 6.0–8.3)
Total Bilirubin: 0.4 mg/dL (ref 0.2–1.2)

## 2016-04-20 LAB — CBC WITH DIFFERENTIAL/PLATELET
Basophils Absolute: 0 10*3/uL (ref 0.0–0.1)
Basophils Relative: 0.4 % (ref 0.0–3.0)
EOS ABS: 0.1 10*3/uL (ref 0.0–0.7)
Eosinophils Relative: 1.7 % (ref 0.0–5.0)
HEMATOCRIT: 27.9 % — AB (ref 36.0–46.0)
Hemoglobin: 9.1 g/dL — ABNORMAL LOW (ref 12.0–15.0)
LYMPHS ABS: 1.7 10*3/uL (ref 0.7–4.0)
LYMPHS PCT: 31.9 % (ref 12.0–46.0)
MCHC: 32.6 g/dL (ref 30.0–36.0)
MCV: 79.1 fl (ref 78.0–100.0)
MONO ABS: 0.5 10*3/uL (ref 0.1–1.0)
Monocytes Relative: 9.6 % (ref 3.0–12.0)
NEUTROS ABS: 3 10*3/uL (ref 1.4–7.7)
NEUTROS PCT: 56.4 % (ref 43.0–77.0)
Platelets: 217 10*3/uL (ref 150.0–400.0)
RBC: 3.53 Mil/uL — ABNORMAL LOW (ref 3.87–5.11)
RDW: 15.6 % — ABNORMAL HIGH (ref 11.5–15.5)
WBC: 5.4 10*3/uL (ref 4.0–10.5)

## 2016-04-20 LAB — BASIC METABOLIC PANEL
BUN: 29 mg/dL — AB (ref 6–23)
CO2: 27 meq/L (ref 19–32)
CREATININE: 0.96 mg/dL (ref 0.40–1.20)
Calcium: 10.1 mg/dL (ref 8.4–10.5)
Chloride: 106 mEq/L (ref 96–112)
GFR: 60.38 mL/min (ref >60.00)
GLUCOSE: 115 mg/dL — AB (ref 70–99)
Potassium: 4.6 mEq/L (ref 3.5–5.1)
Sodium: 140 mEq/L (ref 135–145)

## 2016-04-20 LAB — LIPID PANEL
Cholesterol: 123 mg/dL (ref 0–200)
HDL: 27.5 mg/dL — AB (ref >39.00)
LDL Cholesterol: 59 mg/dL (ref 0–99)
NONHDL: 95.65
TRIGLYCERIDES: 182 mg/dL — AB (ref 0.0–149.0)
Total CHOL/HDL Ratio: 4
VLDL: 36.4 mg/dL (ref 0.0–40.0)

## 2016-04-20 LAB — TSH: TSH: 1.2 u[IU]/mL (ref 0.35–4.50)

## 2016-04-20 NOTE — Assessment & Plan Note (Signed)
New to provider, ongoing for pt.  On Levothyroxine daily.  Currently asymptomatic.  Check labs.  Adjust meds prn

## 2016-04-20 NOTE — Progress Notes (Signed)
Pre visit review using our clinic review tool, if applicable. No additional management support is needed unless otherwise documented below in the visit note. 

## 2016-04-20 NOTE — Progress Notes (Signed)
   Subjective:    Patient ID: Andrea Simmons, female    DOB: [birth year], 76 y.o.   MRN: [id number]  HPI New to establish.  Previous MD- Sharlet Salina, prior to that Dr Deatra Ina at [facility]  DM- chronic problem, on Metformin.  Previously seeing Dr Chalmers Cater.  Released due to good control.  Due for eye exam this month.  UTD on foot exam.  On ARB for renal protection.  Denies symptomatic lows.  No numbness/tingling of hands/feet.  HTN- chronic problem, on Valsartan HCTZ w/ adequate control.  Denies CP, SOB, HAs, visual changes, edema.  Hyperlipidemia- chronic problem, on Crestor 20, Fenofibrate 135.  Denies abd pain, N/V.  Hypothyroid- chronic problem, on Levothyroxine 40mcg daily.  Denies fatigue, changes to skin/hair/nails, changes to bowel/bladder changes  PE- chronic problem, on Coumadin.  Seeing [name].  Fibromyalgia- chronic problem, seeing Dr Dossie Der ([facility]) and Dr Andree Elk for pain management.  Pt reports she is taking 2 Hydrocodone daily, Horizant and the Tizanidine.  Pt is asking for me to assume the prescribing responsibility to avoid a monthly pain management appt   Review of Systems Patient reports no vision/ hearing changes, adenopathy,fever, weight change,  persistant/recurrent hoarseness , swallowing issues, chest pain, palpitations, edema, persistant/recurrent cough, hemoptysis, dyspnea (rest/exertional/paroxysmal nocturnal), gastrointestinal bleeding (melena, rectal bleeding), abdominal pain, significant heartburn, bowel changes, GU symptoms (dysuria, hematuria, incontinence), Gyn symptoms (abnormal  bleeding, pain),  syncope, focal weakness, memory loss, numbness & tingling, skin/hair/nail changes, abnormal bruising or bleeding, anxiety, or depression.     Objective:   Physical Exam  Constitutional: She is oriented to person, place, and time. She appears well-developed and well-nourished. No distress.  HENT:  Head: Normocephalic and atraumatic.  Eyes: Conjunctivae  and EOM are normal. Pupils are equal, round, and reactive to light.  Neck: Normal range of motion. Neck supple. No thyromegaly present.  Cardiovascular: Normal rate, regular rhythm, normal heart sounds and intact distal pulses.   No murmur heard. Pulmonary/Chest: Effort normal and breath sounds normal. No respiratory distress.  Abdominal: Soft. She exhibits no distension. There is no tenderness.  Musculoskeletal: She exhibits no edema.  Lymphadenopathy:    She has no cervical adenopathy.  Neurological: She is alert and oriented to person, place, and time.  Skin: Skin is warm and dry.  Psychiatric: She has a normal mood and affect. Her behavior is normal.  Vitals reviewed.         Assessment & Plan:

## 2016-04-20 NOTE — Assessment & Plan Note (Signed)
New to provider, ongoing for pt.  Previously seeing Dr Chalmers Cater but was released b/c DM was well controlled.  UTD on foot exam, eye exam.  On ARB for renal protection.  Tolerating Metformin w/o difficulty.  Stressed need for healthy diet and regular exercise.  Check labs.  Adjust meds prn

## 2016-04-20 NOTE — Patient Instructions (Addendum)
Follow up in 3-4 months to recheck diabetes We'll notify you of your lab results and make any changes if needed Please call [facility] and schedule your mammogram and bone density (the order is in!) Call and schedule your eye exam at your convenience I'll let you know if we are able to prescribe the Hydrocodone for you Continue to work on healthy diet and regular exercise- you can do it! Call with any questions or concerns Welcome!  We're glad to have you!!!

## 2016-04-20 NOTE — Assessment & Plan Note (Signed)
New to provider.  Ongoing for pt.  She is on life long coumadin for recurrent PE.  Following w/ Coumadin clinic.  No current abnormal bleeding or bruising.  Will follow along.

## 2016-04-20 NOTE — Assessment & Plan Note (Signed)
New to provider, ongoing for pt.  Adequate control on Valsartan HCTZ daily.  Asymptomatic.  Check labs.  No anticipated med changes.  Will follow closely.

## 2016-04-20 NOTE — Assessment & Plan Note (Signed)
New to provider, ongoing for pt.  On Crestor and Fenofibrate w/o difficulty.  Stressed need for healthy diet and regular exercise.  Check labs.  Adjust meds prn

## 2016-04-20 NOTE — Assessment & Plan Note (Signed)
New to provider, ongoing for pt.  Seeing Dr Dossie Der ([facility]) and Dr Andree Elk for pain management since she is on Hydrocodone twice daily- this is down from her previous 4 daily.  She is asking that I assume the prescribing role for her hydrocodone b/c she doesn't want to have to pay a monthly copay to go to pain management.  Discussed that she would need to sign a controlled substance agreement, submit to urine tests, and pick up the prescriptions monthly.  Pt is agreeable to this.  Will need to review controlled substance data base prior to making my decision.

## 2016-04-21 ENCOUNTER — Other Ambulatory Visit: Payer: Self-pay | Admitting: Family Medicine

## 2016-04-21 DIAGNOSIS — D649 Anemia, unspecified: Secondary | ICD-10-CM

## 2016-04-21 NOTE — Progress Notes (Signed)
Called pt and lmovm to return call.

## 2016-04-25 ENCOUNTER — Telehealth: Payer: Self-pay | Admitting: Family Medicine

## 2016-04-25 ENCOUNTER — Other Ambulatory Visit: Payer: Self-pay | Admitting: Family Medicine

## 2016-04-25 DIAGNOSIS — E2839 Other primary ovarian failure: Secondary | ICD-10-CM

## 2016-04-25 NOTE — Telephone Encounter (Signed)
Patient notified of PCP recommendations and is agreement and expresses an understanding.  

## 2016-04-25 NOTE — Telephone Encounter (Signed)
Take w/ food or try a different brand/formulation of iron to see if that helps

## 2016-04-25 NOTE — Telephone Encounter (Signed)
Patients states she was told to take iron supplement.  However, she gets nauseated from taking and wants to know what she should do.

## 2016-05-01 ENCOUNTER — Ambulatory Visit (INDEPENDENT_AMBULATORY_CARE_PROVIDER_SITE_OTHER): Payer: Medicare Other | Admitting: General Practice

## 2016-05-01 DIAGNOSIS — I2699 Other pulmonary embolism without acute cor pulmonale: Secondary | ICD-10-CM

## 2016-05-01 DIAGNOSIS — Z5181 Encounter for therapeutic drug level monitoring: Secondary | ICD-10-CM

## 2016-05-01 LAB — POCT INR: INR: 2.3

## 2016-05-01 NOTE — Patient Instructions (Signed)
Pre visit review using our clinic review tool, if applicable. No additional management support is needed unless otherwise documented below in the visit note. 

## 2016-05-01 NOTE — Progress Notes (Signed)
I agree with this plan.

## 2016-05-03 ENCOUNTER — Other Ambulatory Visit (INDEPENDENT_AMBULATORY_CARE_PROVIDER_SITE_OTHER): Payer: Medicare Other

## 2016-05-03 DIAGNOSIS — D649 Anemia, unspecified: Secondary | ICD-10-CM | POA: Diagnosis not present

## 2016-05-03 LAB — FECAL OCCULT BLOOD, IMMUNOCHEMICAL: FECAL OCCULT BLD: NEGATIVE

## 2016-05-04 ENCOUNTER — Telehealth: Payer: Self-pay | Admitting: Family Medicine

## 2016-05-04 NOTE — Telephone Encounter (Signed)
Pt notified of lab results

## 2016-05-04 NOTE — Telephone Encounter (Signed)
Patient returning call about lab results. Please call back.   Thank you

## 2016-05-04 NOTE — Progress Notes (Signed)
Called pt and lmovm to return call.

## 2016-05-19 ENCOUNTER — Other Ambulatory Visit: Payer: Self-pay | Admitting: Family Medicine

## 2016-05-19 DIAGNOSIS — Z1231 Encounter for screening mammogram for malignant neoplasm of breast: Secondary | ICD-10-CM

## 2016-06-12 ENCOUNTER — Ambulatory Visit (INDEPENDENT_AMBULATORY_CARE_PROVIDER_SITE_OTHER): Payer: Medicare Other | Admitting: General Practice

## 2016-06-12 DIAGNOSIS — I2699 Other pulmonary embolism without acute cor pulmonale: Secondary | ICD-10-CM

## 2016-06-12 DIAGNOSIS — Z5181 Encounter for therapeutic drug level monitoring: Secondary | ICD-10-CM

## 2016-06-12 LAB — POCT INR: INR: 3.2

## 2016-06-12 NOTE — Patient Instructions (Signed)
Pre visit review using our clinic review tool, if applicable. No additional management support is needed unless otherwise documented below in the visit note. 

## 2016-06-12 NOTE — Progress Notes (Signed)
I have reviewed and agree with this plan  

## 2016-06-15 ENCOUNTER — Ambulatory Visit
Admission: RE | Admit: 2016-06-15 | Discharge: 2016-06-15 | Disposition: A | Discharge status: Home / Self Care | Payer: Medicare Other | Source: Ambulatory Visit | Attending: Family Medicine | Admitting: Family Medicine

## 2016-06-15 DIAGNOSIS — E2839 Other primary ovarian failure: Secondary | ICD-10-CM

## 2016-06-15 DIAGNOSIS — Z1231 Encounter for screening mammogram for malignant neoplasm of breast: Secondary | ICD-10-CM

## 2016-06-15 HISTORY — DX: Malignant neoplasm of unspecified site of unspecified female breast: C50.919

## 2016-06-16 LAB — HM DIABETES EYE EXAM

## 2016-07-05 ENCOUNTER — Telehealth: Payer: Self-pay | Admitting: General Practice

## 2016-07-05 DIAGNOSIS — Z86718 Personal history of other venous thrombosis and embolism: Secondary | ICD-10-CM

## 2016-07-05 NOTE — Telephone Encounter (Signed)
Advised pt that Dr> Birdie Riddle could not sign off on her surgical clearance due to pt developing clots after her last surgery. Pt stated an understanding and gave ok for hematology referral to be placed. This referral was placed today.

## 2016-07-10 ENCOUNTER — Ambulatory Visit (INDEPENDENT_AMBULATORY_CARE_PROVIDER_SITE_OTHER): Payer: Medicare Other | Admitting: General Practice

## 2016-07-10 DIAGNOSIS — I2699 Other pulmonary embolism without acute cor pulmonale: Secondary | ICD-10-CM

## 2016-07-10 DIAGNOSIS — Z5181 Encounter for therapeutic drug level monitoring: Secondary | ICD-10-CM | POA: Diagnosis not present

## 2016-07-10 LAB — POCT INR: INR: 3.6

## 2016-07-10 NOTE — Patient Instructions (Signed)
Pre visit review using our clinic review tool, if applicable. No additional management support is needed unless otherwise documented below in the visit note. 

## 2016-07-10 NOTE — Progress Notes (Signed)
I agree with this plan.

## 2016-07-31 ENCOUNTER — Encounter: Payer: Self-pay | Admitting: Hematology

## 2016-07-31 ENCOUNTER — Ambulatory Visit (HOSPITAL_BASED_OUTPATIENT_CLINIC_OR_DEPARTMENT_OTHER): Payer: Medicare Other | Admitting: Hematology

## 2016-07-31 ENCOUNTER — Telehealth: Payer: Self-pay | Admitting: Hematology

## 2016-07-31 VITALS — BP 198/91 | HR 64 | Temp 98.6°F | Resp 18 | Ht 64.0 in | Wt 144.3 lb

## 2016-07-31 DIAGNOSIS — D509 Iron deficiency anemia, unspecified: Secondary | ICD-10-CM | POA: Diagnosis not present

## 2016-07-31 DIAGNOSIS — I2699 Other pulmonary embolism without acute cor pulmonale: Secondary | ICD-10-CM | POA: Diagnosis not present

## 2016-07-31 DIAGNOSIS — Z7901 Long term (current) use of anticoagulants: Secondary | ICD-10-CM

## 2016-07-31 DIAGNOSIS — E538 Deficiency of other specified B group vitamins: Secondary | ICD-10-CM

## 2016-07-31 NOTE — Patient Instructions (Signed)
Thank you for choosing [facility] to provide your oncology and hematology care.  To afford each patient quality time with our providers, please arrive 30 minutes before your scheduled appointment time.  If you arrive late for your appointment, you may be asked to reschedule.  We strive to give you quality time with our providers, and arriving late affects you and other patients whose appointments are after yours.  If you are a no show for multiple scheduled visits, you may be dismissed from the clinic at the providers discretion.   Again, thank you for choosing [facility], our hope is that these requests will decrease the amount of time that you wait before being seen by our physicians.  ______________________________________________________________________ Should you have questions after your visit to the [facility], please contact our office at [phone] between the hours of 8:30 and 4:30 p.m.    Voicemails left after 4:30p.m will not be returned until the following business day.   For prescription refill requests, please have your pharmacy contact us directly.  Please also try to allow 48 hours for prescription requests.   Please contact the scheduling department for questions regarding scheduling.  For scheduling of procedures such as PET scans, CT scans, MRI, Ultrasound, etc please contact central scheduling at [phone].   Resources For Cancer Patients and Caregivers:  American Cancer Society:  800-227-2345  Can help patients locate various types of support and financial assistance Cancer Care: 1-800-813-HOPE (4673) Provides financial assistance, online support groups, medication/co-pay assistance.   [place] DSS:  336-641-3447 Where to apply for food stamps, Medicaid, and utility assistance Medicare Rights Center: 800-333-4114 Helps people with Medicare understand their rights and benefits, navigate the Medicare system, and secure the  quality healthcare they deserve SCAT: 336-333-6589 [place] [employer]'s shared-ride transportation service for eligible riders who have a disability that prevents them from riding the fixed route bus.   For additional information on assistance programs please contact our social worker:   Grier Hock/Abigail Elmore:  [phone] 

## 2016-07-31 NOTE — Telephone Encounter (Signed)
Gave patient AVS and calender per 5/14 los. To get labs tomorrow . RTC needed based on labs - no additional appt scheduled.

## 2016-07-31 NOTE — Progress Notes (Signed)
Marland Kitchen    HEMATOLOGY/ONCOLOGY CONSULTATION NOTE  Date of Service: 07/31/2016  Patient Care Team: [name], MD as PCP - General (Family Medicine) Irene Shipper, MD as Consulting Physician (Gastroenterology) Deneise Lever, MD as Consulting Physician (Pulmonary Disease) Jacelyn Pi, MD as Consulting Physician (Endocrinology) Valinda Party, MD as Consulting Physician (Rheumatology) [name], Billey Co., MD as Attending Physician (Psychiatry) Renie Ora, MD as Referring Physician (Anesthesiology)  CHIEF COMPLAINTS/PURPOSE OF CONSULTATION:  Hypercoagulable state with recurrent Pulmonary Embolism on indefinite anticoagulation   HISTORY OF PRESENTING ILLNESS:   Andrea Simmons is a wonderful 76 y.o. female who has been referred to [place] by Dr .Birdie Riddle, Aundra Millet, MD  for evaluation and recommendations regarding anticoagulation in the context of possible elective blepharoplasty/eye lift surgery.  Patient is a history of hypertension, diabetes, fibromyalgia, remote history of breast cancer in 1993, hypothyroidism who had a pulmonary embolism in 2008 while in [place] after long distance driving to [place] from [place]. She had presented with shortness of breath and dyspnea on exertion. She was on anticoagulation for 5-1/2 months and his anticoagulation was held for EGD and colonoscopy. Patient had recurrent bilateral pulmonary embolisms off Coumadin and was recommended indefinite anticoagulation for her unprovoked bilateral pulmonary emboli. Patient has been on Coumadin since 2009.  She notes no overt bleeding. She notes that she is also on baby aspirin in addition to Coumadin, takes Celebrex for her osteoarthritis/fibromyalgia and is also on Cymbalta. We noted that this combination of medications consistently increase her risk of GI bleeding.  Her recent labs with her primary care physician on 04/20/2016 showed that her hemoglobin was down to 9.1 with MCV of 79  and elevated RDW of 15.6 with the normal WBC count and platelet count. This was concerning for possible slow GI bleeding with iron deficiency related to her anticoagulation and other medications. Patient notes no overt melena or hematochezia or hematemesis. No hematuria or other overt signs of bleeding. She has been taking an over-the-counter iron supplement but does not know which one. Has been on chronic PPI therapy.  She notes that her upper eyelids have somewhat lax skin which she has seen her eye doctor for and is contemplating possible eyelid/eye lift surgery. She is not decided that she would go for surgery.  No fevers no chills no night sweats no unexpected weight loss no abdominal pain. No issues with stability of anticoagulation with Coumadin.  MEDICAL HISTORY:  Past Medical History:  Diagnosis Date  . Anemia   . Anxiety   . Breast cancer ([place])   . Depression   . Diabetes mellitus type II   . Fibromyalgia   . GERD (gastroesophageal reflux disease)   . Hyperlipidemia   . Hypertension   . Malignant neoplasm of breast (female), unspecified site 1993   L breast s/p mastectomy and tamoxifen x 40yrs  . 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 Unspecified hypothyroidism     SURGICAL HISTORY: Past Surgical History:  Procedure Laterality Date  . APPENDECTOMY    . BREAST BIOPSY Left 1993  . BREAST BIOPSY Right    stero. Benign  . BREAST RECONSTRUCTION Left   . CATARACT EXTRACTION    . MASTECTOMY Left   . ROTATOR CUFF REPAIR    . SPINAL FUSION      x 2  . TOTAL ABDOMINAL HYSTERECTOMY W/ BILATERAL SALPINGOOPHORECTOMY    . TUBAL LIGATION  SOCIAL HISTORY: Social History   Social History  . Marital status: Married    Spouse name: N/A  . Number of children: N/A  . Years of education: N/A   Occupational History  . teacher's assistant Retired  . hair dresser   . school bus driver    Social History Main Topics  .  Smoking status: Never Smoker  . Smokeless tobacco: Never Used  . Alcohol use No  . Drug use: No  . Sexual activity: Not on file   Other Topics Concern  . Not on file   Social History Narrative  . No narrative on file    FAMILY HISTORY: Family History  Problem Relation Age of Onset  . Breast cancer Other   . Depression Other        Parent  . Arthritis Other        Parent, Grandparent  . Hypertension Other        Grandparent  . Hyperlipidemia Other        [place]  . Miscarriages / Stillbirths Other        Grandparent  . Stroke Other        [facility]  . Cancer Maternal Uncle        prostate  . Hypertension Maternal Grandfather     ALLERGIES:  is allergic to anaprox [naproxen sodium]; lipitor [atorvastatin]; meperidine hcl; and morphine.  MEDICATIONS:  Current Outpatient Prescriptions  Medication Sig Dispense Refill  . aspirin 81 MG tablet Take 81 mg by mouth daily.      . calcium carbonate (OS-CAL) 600 MG TABS Take 1,200 mg by mouth daily.      . celecoxib (CELEBREX) 200 MG capsule TAKE ONE CAPSULE BY MOUTH EVERY DAY 90 capsule 3  . Choline Fenofibrate 135 MG capsule Take 135 mg by mouth daily.      . DULoxetine (CYMBALTA) 60 MG capsule Take 60 mg by mouth 2 (two) times daily.  3  . FIBER ADULT GUMMIES 2 g CHEW Chew by mouth.    Marland Kitchen HORIZANT 600 MG TBCR TAKE 1 TABLET EVERY NIGHT WITH DINNER  3  . HYDROcodone-acetaminophen (NORCO) 10-325 MG tablet Take 1 tablet by mouth 3 (three) times daily as needed.  0  . levothyroxine (SYNTHROID, LEVOTHROID) 50 MCG tablet Take 50 mcg by mouth daily before breakfast.    . metFORMIN (GLUCOPHAGE-XR) 500 MG 24 hr tablet Take 1,000 mg by mouth 2 (two) times daily.  3  . Multiple Vitamin (MULTIVITAMIN) tablet Take 1 tablet by mouth daily.      . Omega-3 Fatty Acids (FISH OIL) 500 MG CAPS Take 1 capsule by mouth 2 (two) times daily.     Marland Kitchen omeprazole (PRILOSEC) 20 MG capsule TAKE ONE CAPSULE BY MOUTH EVERY DAY 90 capsule 3  . ONE TOUCH ULTRA TEST test  strip Use as directed    . ONETOUCH DELICA LANCETS MISC Use as directed    . RESTASIS MULTIDOSE 0.05 % ophthalmic emulsion INSTILL 1 DROP BY OPHTHALMIC ROUTE EVERY 12 HOURS INTO AFFECTED EYE(S)  6  . rosuvastatin (CRESTOR) 20 MG tablet Take 10 mg by mouth daily.     Marland Kitchen tiZANidine (ZANAFLEX) 2 MG tablet Take 2 mg by mouth 2 (two) times daily as needed.  2  . valsartan-hydrochlorothiazide (DIOVAN-HCT) 320-25 MG tablet TAKE 1 TABLET BY MOUTH DAILY. 90 tablet 2  . warfarin (COUMADIN) 5 MG tablet TAKE AS DIRECTED BY COUMADIN CLINIC. 90 tablet 1   No current facility-administered medications for this visit.  REVIEW OF SYSTEMS:    10 Point review of Systems was done is negative except as noted above.  PHYSICAL EXAMINATION: ECOG PERFORMANCE STATUS: 1 - Symptomatic but completely ambulatory  . Vitals:   07/31/16 1530  BP: (!) 198/91  Pulse: 64  Resp: 18  Temp: 98.6 F (37 C)   Filed Weights   07/31/16 1530  Weight: 144 lb 4.8 oz (65.5 kg)   .Body mass index is 24.77 kg/m.  GENERAL:alert, in no acute distress and comfortable SKIN: no acute rashes, no significant lesions EYES: conjunctiva are pink and non-injected, sclera anicteric OROPHARYNX: MMM, no exudates, no oropharyngeal erythema or ulceration NECK: supple, no JVD LYMPH:  no palpable lymphadenopathy in the cervical, axillary or inguinal regions LUNGS: clear to auscultation b/l with normal respiratory effort HEART: regular rate & rhythm ABDOMEN:  normoactive bowel sounds , non tender, not distended. Extremity: no pedal edema PSYCH: alert & oriented x 3 with fluent speech NEURO: no focal motor/sensory deficits  LABORATORY DATA:  I have reviewed the data as listed  . CBC Latest Ref Rng & Units 08/01/2016 04/20/2016 11/03/2013  WBC 3.9 - 10.3 10e3/uL 5.1 5.4 5.5  Hemoglobin 11.6 - 15.9 g/dL 9.5(L) 9.1(L) 11.3(L)  Hematocrit 34.8 - 46.6 % 31.2(L) 27.9(L) 34.2(L)  Platelets 145 - 400 10e3/uL 221 217.0 196.0   . CBC      Component Value Date/Time   WBC 5.1 08/01/2016 1527   WBC 5.4 04/20/2016 1431   RBC 3.72 08/01/2016 1527   RBC 3.53 (L) 04/20/2016 1431   HGB 9.5 (L) 08/01/2016 1527   HCT 31.2 (L) 08/01/2016 1527   PLT 221 08/01/2016 1527   MCV 83.9 08/01/2016 1527   MCH 25.5 08/01/2016 1527   MCH 29.4 09/01/2013 1638   MCHC 30.4 (L) 08/01/2016 1527   MCHC 32.6 04/20/2016 1431   RDW 17.2 (H) 08/01/2016 1527   LYMPHSABS 2.0 08/01/2016 1527   MONOABS 0.4 08/01/2016 1527   EOSABS 0.1 08/01/2016 1527   BASOSABS 0.0 08/01/2016 1527    . CMP Latest Ref Rng & Units 08/01/2016 04/20/2016 11/03/2013  Glucose 70 - 140 mg/dl 93 115(H) 101(H)  BUN 7.0 - 26.0 mg/dL 19.9 29(H) 23  Creatinine 0.6 - 1.1 mg/dL 0.9 0.96 0.9  Sodium 136 - 145 mEq/L 141 140 143  Potassium 3.5 - 5.1 mEq/L 4.6 4.6 4.5  Chloride 96 - 112 mEq/L - 106 106  CO2 22 - 29 mEq/L 24 27 27   Calcium 8.4 - 10.4 mg/dL 9.7 10.1 9.5  Total Protein 6.4 - 8.3 g/dL 7.2 6.8 7.1  Total Bilirubin 0.20 - 1.20 mg/dL 0.33 0.4 0.6  Alkaline Phos 40 - 150 U/L 48 40 42  AST 5 - 34 U/L 41(H) 35 35  ALT 0 - 55 U/L 25 19 20     RADIOGRAPHIC STUDIES: I have personally reviewed the radiological images as listed and agreed with the findings in the report. No results found.  ASSESSMENT & PLAN:   76 year old female with multiple medical comorbidities with  #1 history of recurrent pulmonary embolism. First event in 2008 was possibly provoked by long-distance Robina Ade journey from [place] to [place]. However the patient had a second event in 2009 soon after being off Coumadin and this was apparently unprovoked. She has already been recommended indefinite anticoagulation by her pulmonologist and has been on Coumadin without any acute issues. Plan -Discussed with patient about her options of ongoing anticoagulation vs trying to get off anticoagulation and the risks and benefits of each  approach. -She is desirous of continuing anticoagulation to  prevent recurrent venous thromboembolic episodes. In this setting doing hypercoagulable workup would not change recommendations and we discussed and decided not to pursue these. -Given her consent for the unprovoked pulmonary embolism in 2009 would recommend bridging with Lovenox perioperatively for any elective procedures including her proposed eyelid/eyelid surgery. -This is an elective and not absolutely essential surgery and I gave her my frank opinion about pros and cons of considering this from a perspective of venous thromboembolism and bleeding complications. -I also encouraged her to discuss with her primary care physician the need for aspirin in addition to Coumadin.  #2 Microcytic anemia with a hemoglobin of 9.1 noted on 04/20/2016. This appears to be likely related to iron deficiency or anemia of chronic disease. Patient has multiple risk factors for iron deficiency including possible slow GI losses on anticoagulation with Coumadin/aspirin/SSRI/Cox 2 inhibitors. She is also on chronic PPI therapy which may interfere with iron absorption. Plan -We'll send out a ferritin and iron profile -We'll also check a B12 level since she is at risk for B12 deficiency with chronic PPI therapy and the use of metformin chronically. -Myeloma panel -Monitor for GI bleeding with primary care physician. -Will defer to her primary care physician the need for repeat GI workup in the setting of possible iron deficiency. -Patient is currently on over-the-counter iron supplement. Her hemoglobin is somewhat better at 9.5. -Would recommend iron polysaccharide 150 mg po twice a day to maintain ferritin levels more than 100. Recheck ferritin and iron profile with primary care physician in 2-3 months. -B12 replacement as needed based on labs -Will defer to primary care physician regarding anticoagulation other medication management to reduce her risk of GI bleeding as possible.   Patient had multiple questions  which were answered in details. She needs to continue following with her primary care physician and we shall see her on an as-needed basis if any new questions or concerns arise.  All of the patients questions were answered with apparent satisfaction. The patient knows to call the clinic with any problems, questions or concerns.  I spent 45 minutes counseling the patient face to face. The total time spent in the appointment was 60 minutes and more than 50% was on counseling and direct patient cares.    Sullivan Lone MD [place] AAHIVMS [facility] Hematology/Oncology Physician [facility]  (Office):       [phone] (Work cell):  [phone] (Fax):           [phone]  07/31/2016 3:38 PM

## 2016-08-01 ENCOUNTER — Other Ambulatory Visit (HOSPITAL_BASED_OUTPATIENT_CLINIC_OR_DEPARTMENT_OTHER): Payer: Medicare Other

## 2016-08-01 DIAGNOSIS — E538 Deficiency of other specified B group vitamins: Secondary | ICD-10-CM

## 2016-08-01 DIAGNOSIS — D509 Iron deficiency anemia, unspecified: Secondary | ICD-10-CM

## 2016-08-01 LAB — COMPREHENSIVE METABOLIC PANEL
ALBUMIN: 4 g/dL (ref 3.5–5.0)
ALK PHOS: 48 U/L (ref 40–150)
ALT: 25 U/L (ref 0–55)
ANION GAP: 10 meq/L (ref 3–11)
AST: 41 U/L — ABNORMAL HIGH (ref 5–34)
BUN: 19.9 mg/dL (ref 7.0–26.0)
CO2: 24 mEq/L (ref 22–29)
Calcium: 9.7 mg/dL (ref 8.4–10.4)
Chloride: 107 mEq/L (ref 98–109)
Creatinine: 0.9 mg/dL (ref 0.6–1.1)
EGFR: 60 mL/min/{1.73_m2} — AB (ref >90)
Glucose: 93 mg/dl (ref 70–140)
POTASSIUM: 4.6 meq/L (ref 3.5–5.1)
Sodium: 141 mEq/L (ref 136–145)
Total Bilirubin: 0.33 mg/dL (ref 0.20–1.20)
Total Protein: 7.2 g/dL (ref 6.4–8.3)

## 2016-08-01 LAB — CBC & DIFF AND RETIC
BASO%: 0.4 % (ref 0.0–2.0)
Basophils Absolute: 0 10*3/uL (ref 0.0–0.1)
EOS%: 1.4 % (ref 0.0–7.0)
Eosinophils Absolute: 0.1 10*3/uL (ref 0.0–0.5)
HEMATOCRIT: 31.2 % — AB (ref 34.8–46.6)
HEMOGLOBIN: 9.5 g/dL — AB (ref 11.6–15.9)
IMMATURE RETIC FRACT: 13.8 % — AB (ref 1.60–10.00)
LYMPH%: 39.3 % (ref 14.0–49.7)
MCH: 25.5 pg (ref 25.1–34.0)
MCHC: 30.4 g/dL — ABNORMAL LOW (ref 31.5–36.0)
MCV: 83.9 fL (ref 79.5–101.0)
MONO#: 0.4 10*3/uL (ref 0.1–0.9)
MONO%: 8.1 % (ref 0.0–14.0)
NEUT#: 2.6 10*3/uL (ref 1.5–6.5)
NEUT%: 50.8 % (ref 38.4–76.8)
Platelets: 221 10*3/uL (ref 145–400)
RBC: 3.72 10*6/uL (ref 3.70–5.45)
RDW: 17.2 % — AB (ref 11.2–14.5)
RETIC CT ABS: 49.85 10*3/uL (ref 33.70–90.70)
Retic %: 1.34 % (ref 0.70–2.10)
WBC: 5.1 10*3/uL (ref 3.9–10.3)
lymph#: 2 10*3/uL (ref 0.9–3.3)

## 2016-08-02 LAB — VITAMIN B12: Vitamin B12: 735 pg/mL (ref 232–1245)

## 2016-08-02 LAB — IRON AND TIBC
%SAT: 5 % — ABNORMAL LOW (ref 21–57)
Iron: 31 ug/dL — ABNORMAL LOW (ref 41–142)
TIBC: 576 ug/dL — ABNORMAL HIGH (ref 236–444)
UIBC: 545 ug/dL — ABNORMAL HIGH (ref 120–384)

## 2016-08-02 LAB — FERRITIN: Ferritin: 7 ng/ml — ABNORMAL LOW (ref 9–269)

## 2016-08-04 LAB — MULTIPLE MYELOMA PANEL, SERUM
Albumin SerPl Elph-Mcnc: 3.8 g/dL (ref 2.9–4.4)
Albumin/Glob SerPl: 1.3 (ref 0.7–1.7)
Alpha 1: 0.2 g/dL (ref 0.0–0.4)
Alpha2 Glob SerPl Elph-Mcnc: 0.6 g/dL (ref 0.4–1.0)
B-Globulin SerPl Elph-Mcnc: 1.2 g/dL (ref 0.7–1.3)
GAMMA GLOB SERPL ELPH-MCNC: 1 g/dL (ref 0.4–1.8)
GLOBULIN, TOTAL: 3 g/dL (ref 2.2–3.9)
IGA/IMMUNOGLOBULIN A, SERUM: 103 mg/dL (ref 64–422)
IgG, Qn, Serum: 777 mg/dL (ref 700–1600)
IgM, Qn, Serum: 48 mg/dL (ref 26–217)
Total Protein: 6.8 g/dL (ref 6.0–8.5)

## 2016-08-07 ENCOUNTER — Ambulatory Visit (INDEPENDENT_AMBULATORY_CARE_PROVIDER_SITE_OTHER): Payer: Medicare Other | Admitting: General Practice

## 2016-08-07 DIAGNOSIS — Z5181 Encounter for therapeutic drug level monitoring: Secondary | ICD-10-CM

## 2016-08-07 DIAGNOSIS — I2699 Other pulmonary embolism without acute cor pulmonale: Secondary | ICD-10-CM | POA: Diagnosis not present

## 2016-08-07 LAB — POCT INR: INR: 1.9

## 2016-08-07 NOTE — Progress Notes (Signed)
I agree with this plan.

## 2016-08-07 NOTE — Patient Instructions (Signed)
Pre visit review using our clinic review tool, if applicable. No additional management support is needed unless otherwise documented below in the visit note. 

## 2016-08-16 ENCOUNTER — Ambulatory Visit (INDEPENDENT_AMBULATORY_CARE_PROVIDER_SITE_OTHER): Payer: Medicare Other | Admitting: Family Medicine

## 2016-08-16 ENCOUNTER — Encounter: Payer: Self-pay | Admitting: Family Medicine

## 2016-08-16 VITALS — BP 120/75 | HR 81 | Temp 97.9°F | Resp 16 | Ht 64.0 in | Wt 141.2 lb

## 2016-08-16 DIAGNOSIS — R252 Cramp and spasm: Secondary | ICD-10-CM | POA: Diagnosis not present

## 2016-08-16 DIAGNOSIS — E119 Type 2 diabetes mellitus without complications: Secondary | ICD-10-CM

## 2016-08-16 LAB — BASIC METABOLIC PANEL
BUN: 26 mg/dL — ABNORMAL HIGH (ref 6–23)
CALCIUM: 10.1 mg/dL (ref 8.4–10.5)
CO2: 25 mEq/L (ref 19–32)
CREATININE: 0.98 mg/dL (ref 0.40–1.20)
Chloride: 104 mEq/L (ref 96–112)
GFR: 58.91 mL/min — ABNORMAL LOW (ref >60.00)
Glucose, Bld: 89 mg/dL (ref 70–99)
Potassium: 4.1 mEq/L (ref 3.5–5.1)
SODIUM: 138 meq/L (ref 135–145)

## 2016-08-16 LAB — MAGNESIUM: Magnesium: 1.7 mg/dL (ref 1.5–2.5)

## 2016-08-16 LAB — HEMOGLOBIN A1C: HEMOGLOBIN A1C: 6.3 % (ref 4.6–6.5)

## 2016-08-16 NOTE — Progress Notes (Signed)
   Subjective:    Patient ID: Andrea Simmons, female    DOB: [birth year], 76 y.o.   MRN: [id number]  HPI DM- chronic problem, last A1C 6.4  On Metformin 500mg  BID.  UTD on foot exam, eye exam.  On ARB for renal protection.  Pt has lost 3 lbs since last visit.  Pt has been working in the yard.  Denies symptomatic lows, numbness/tingling of hands/feet.  Denies CP, SOB, HAs, visual changes, edema.  + leg cramps- chronic problem.   Review of Systems For ROS see HPI     Objective:   Physical Exam  Constitutional: She is oriented to person, place, and time. She appears well-developed and well-nourished. No distress.  HENT:  Head: Normocephalic and atraumatic.  Eyes: Conjunctivae and EOM are normal. Pupils are equal, round, and reactive to light.  Neck: Normal range of motion. Neck supple. No thyromegaly present.  Cardiovascular: Normal rate, regular rhythm, normal heart sounds and intact distal pulses.   No murmur heard. Pulmonary/Chest: Effort normal and breath sounds normal. No respiratory distress.  Abdominal: Soft. She exhibits no distension. There is no tenderness.  Musculoskeletal: She exhibits no edema.  Lymphadenopathy:    She has no cervical adenopathy.  Neurological: She is alert and oriented to person, place, and time.  Skin: Skin is warm and dry.  Psychiatric: She has a normal mood and affect. Her behavior is normal.  Vitals reviewed.         Assessment & Plan:

## 2016-08-16 NOTE — Progress Notes (Signed)
Pre visit review using our clinic review tool, if applicable. No additional management support is needed unless otherwise documented below in the visit note. 

## 2016-08-16 NOTE — Assessment & Plan Note (Signed)
Chronic problem, on Metformin twice daily.  Hx of excellent control.  UTD on foot exam, eye exam.  On ARB for renal protection.  Check labs.  Adjust meds prn

## 2016-08-16 NOTE — Patient Instructions (Signed)
Schedule your complete physical in 3-4 months and your Medicare Wellness visit at the same time [facility] notify you of your lab results and make any changes if needed Continue to drink plenty of fluids to help w/ cramps Call with any questions or concerns Have a great summer!!!

## 2016-08-19 ENCOUNTER — Other Ambulatory Visit: Payer: Self-pay | Admitting: Family Medicine

## 2016-09-04 ENCOUNTER — Ambulatory Visit (INDEPENDENT_AMBULATORY_CARE_PROVIDER_SITE_OTHER): Payer: Medicare Other | Admitting: General Practice

## 2016-09-04 DIAGNOSIS — Z5181 Encounter for therapeutic drug level monitoring: Secondary | ICD-10-CM | POA: Diagnosis not present

## 2016-09-04 DIAGNOSIS — I2699 Other pulmonary embolism without acute cor pulmonale: Secondary | ICD-10-CM

## 2016-09-04 LAB — POCT INR: INR: 2.6

## 2016-09-04 NOTE — Patient Instructions (Signed)
Pre visit review using our clinic review tool, if applicable. No additional management support is needed unless otherwise documented below in the visit note. 

## 2016-09-04 NOTE — Progress Notes (Signed)
I agree with this plan.

## 2016-09-11 ENCOUNTER — Encounter (HOSPITAL_COMMUNITY): Payer: Self-pay

## 2016-09-11 DIAGNOSIS — Z7901 Long term (current) use of anticoagulants: Secondary | ICD-10-CM | POA: Diagnosis not present

## 2016-09-11 DIAGNOSIS — E039 Hypothyroidism, unspecified: Secondary | ICD-10-CM | POA: Insufficient documentation

## 2016-09-11 DIAGNOSIS — Z7982 Long term (current) use of aspirin: Secondary | ICD-10-CM | POA: Diagnosis not present

## 2016-09-11 DIAGNOSIS — E119 Type 2 diabetes mellitus without complications: Secondary | ICD-10-CM | POA: Diagnosis not present

## 2016-09-11 DIAGNOSIS — I10 Essential (primary) hypertension: Secondary | ICD-10-CM | POA: Insufficient documentation

## 2016-09-11 DIAGNOSIS — Z79899 Other long term (current) drug therapy: Secondary | ICD-10-CM | POA: Insufficient documentation

## 2016-09-11 DIAGNOSIS — S161XXA Strain of muscle, fascia and tendon at neck level, initial encounter: Secondary | ICD-10-CM | POA: Diagnosis not present

## 2016-09-11 DIAGNOSIS — Y999 Unspecified external cause status: Secondary | ICD-10-CM | POA: Insufficient documentation

## 2016-09-11 DIAGNOSIS — Z853 Personal history of malignant neoplasm of breast: Secondary | ICD-10-CM | POA: Insufficient documentation

## 2016-09-11 DIAGNOSIS — Y9389 Activity, other specified: Secondary | ICD-10-CM | POA: Diagnosis not present

## 2016-09-11 DIAGNOSIS — Y929 Unspecified place or not applicable: Secondary | ICD-10-CM | POA: Diagnosis not present

## 2016-09-11 DIAGNOSIS — S199XXA Unspecified injury of neck, initial encounter: Secondary | ICD-10-CM | POA: Diagnosis present

## 2016-09-11 DIAGNOSIS — X500XXA Overexertion from strenuous movement or load, initial encounter: Secondary | ICD-10-CM | POA: Insufficient documentation

## 2016-09-11 DIAGNOSIS — Z7984 Long term (current) use of oral hypoglycemic drugs: Secondary | ICD-10-CM | POA: Diagnosis not present

## 2016-09-11 NOTE — ED Triage Notes (Signed)
Pt reports a flare up of her chronic neck pain. She states there is pain in just one spot to her posterior neck. She states she was cleaning her home when she aggravated the pain. She had neck surgery from a broken neck 40 years ago and a spinal fusion "my flare ups are not normally like this."

## 2016-09-12 ENCOUNTER — Emergency Department (HOSPITAL_COMMUNITY): Payer: Medicare Other

## 2016-09-12 ENCOUNTER — Emergency Department (HOSPITAL_COMMUNITY)
Admission: EM | Admit: 2016-09-12 | Discharge: 2016-09-12 | Disposition: A | Discharge status: Home / Self Care | Payer: Medicare Other | Attending: Emergency Medicine | Admitting: Emergency Medicine

## 2016-09-12 DIAGNOSIS — S161XXA Strain of muscle, fascia and tendon at neck level, initial encounter: Secondary | ICD-10-CM

## 2016-09-12 DIAGNOSIS — M542 Cervicalgia: Secondary | ICD-10-CM

## 2016-09-12 MED ORDER — CYCLOBENZAPRINE HCL 10 MG PO TABS: 10.0000 mg | ORAL_TABLET | Freq: Three times a day (TID) | ORAL | 0 refills | Status: DC | PRN

## 2016-09-12 MED ORDER — CYCLOBENZAPRINE HCL 10 MG PO TABS
5.0000 mg | ORAL_TABLET | Freq: Once | ORAL | Status: AC
  Administered 2016-09-12: 5 mg via ORAL
  Filled 2016-09-12: qty 1

## 2016-09-12 MED ORDER — HYDROCODONE-ACETAMINOPHEN 5-325 MG PO TABS: 1.0000 | ORAL_TABLET | ORAL | 0 refills | Status: DC | PRN

## 2016-09-12 MED ORDER — HYDROCODONE-ACETAMINOPHEN 5-325 MG PO TABS
1.0000 | ORAL_TABLET | Freq: Once | ORAL | Status: AC
  Administered 2016-09-12: 1 via ORAL
  Filled 2016-09-12: qty 1

## 2016-09-12 NOTE — ED Notes (Signed)
Pt departed in NAD, refused use of wheelchair.  

## 2016-09-12 NOTE — ED Provider Notes (Signed)
[facility] DEPT Provider Note   CSN: [id number] Arrival date & time: 09/11/16  1906  By signing my name below, I, Marcello Moores, attest that this documentation has been prepared under the direction and in the presence of Jola Schmidt, MD. Electronically Signed: Marcello Moores, ED Scribe. 09/12/16. 2:44 AM.  History   Chief Complaint Chief Complaint  Patient presents with  . Neck Pain   The history is provided by the patient. No language interpreter was used.   HPI Comments: Andrea Simmons is a 76 y.o. female who presents to the Emergency Department complaining of gradually worsening, moderate right sided neck pain. She also complains of right arm tingling. Pt was cleaning her house, and was trying to lift her mattress prior to the onset of her symptoms. Turning her head to the right betters her pain, and turning it to the left exacerbates it. The pt has tried Tylenol and warm and cold compresses for her pain with inadeqaute relief. She states that she fractured her neck 40 years ago, where the break went unnoticed for 7 months. The pt denies chest pain and SOB.  Past Medical History:  Diagnosis Date  . Anemia   . Anxiety   . Breast cancer ([place])   . Depression   . Diabetes mellitus type II   . Fibromyalgia   . GERD (gastroesophageal reflux disease)   . Hyperlipidemia   . Hypertension   . Malignant neoplasm of breast (female), unspecified site 1993   L breast s/p mastectomy and tamoxifen x 90yrs  . 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 Unspecified hypothyroidism     Patient Active Problem List   Diagnosis Date Noted  . Hyperlipidemia 12/26/2013  . Encounter for therapeutic drug monitoring 04/16/2013  . 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 Long term (current) use of anticoagulants 06/30/2010  . Diabetes type 2, controlled ([place]) 08/27/2008  . ANEMIA-NOS 08/27/2008  . Seasonal and  perennial allergic rhinitis 08/27/2008  . PULMONARY NODULE 08/27/2008  . Fibromyalgia 08/27/2008  . ADENOCARCINOMA, BREAST 03/20/2007  . Hypothyroidism 03/20/2007  . Essential hypertension 03/20/2007  . Recurrent pulmonary emboli ([place]) 03/20/2007  . GERD 03/20/2007  . ESOPHAGEAL STRICTURE 03/08/2007    Past Surgical History:  Procedure Laterality Date  . APPENDECTOMY    . BREAST BIOPSY Left 1993  . BREAST BIOPSY Right    stero. Benign  . BREAST RECONSTRUCTION Left   . CATARACT EXTRACTION    . MASTECTOMY Left   . ROTATOR CUFF REPAIR    . SPINAL FUSION      x 2  . TOTAL ABDOMINAL HYSTERECTOMY W/ BILATERAL SALPINGOOPHORECTOMY    . TUBAL LIGATION      OB History    No data available       Home Medications    Prior to Admission medications   Medication Sig Start Date End Date Taking? Authorizing Provider  aspirin 81 MG tablet Take 81 mg by mouth daily.      [provider]  calcium carbonate (OS-CAL) 600 MG TABS Take 1,200 mg by mouth daily.      [provider]  celecoxib (CELEBREX) 200 MG capsule TAKE ONE CAPSULE BY MOUTH EVERY DAY 02/21/16   [name], MD  Choline Fenofibrate 135 MG capsule Take 135 mg by mouth daily.      [provider]  DULoxetine (CYMBALTA) 60 MG capsule Take 60 mg by mouth  2 (two) times daily. 03/27/16   [provider]  FIBER ADULT GUMMIES 2 g CHEW Chew by mouth.    [provider]  HORIZANT 600 MG TBCR TAKE 1 TABLET EVERY NIGHT WITH DINNER 03/02/16   [provider]  HYDROcodone-acetaminophen (NORCO) 10-325 MG tablet Take 1 tablet by mouth 3 (three) times daily as needed. 04/10/16   [provider]  levothyroxine (SYNTHROID, LEVOTHROID) 50 MCG tablet Take 50 mcg by mouth daily before breakfast.    [provider]  metFORMIN (GLUCOPHAGE-XR) 500 MG 24 hr tablet Take 1,000 mg by mouth 2 (two) times daily. 03/23/16   [provider]  Multiple Vitamin (MULTIVITAMIN)  tablet Take 1 tablet by mouth daily.      [provider]  Omega-3 Fatty Acids (FISH OIL) 500 MG CAPS Take 1 capsule by mouth 2 (two) times daily.     [provider]  omeprazole (PRILOSEC) 20 MG capsule TAKE ONE CAPSULE BY MOUTH EVERY DAY 12/01/15   Hoyt Koch, MD  ONE TOUCH ULTRA TEST test strip Use as directed 10/26/10   [provider]  Jonetta Speak LANCETS MISC Use as directed 10/26/10   [provider]  RESTASIS MULTIDOSE 0.05 % ophthalmic emulsion INSTILL 1 DROP BY OPHTHALMIC ROUTE EVERY 12 HOURS INTO AFFECTED EYE(S) 06/21/16   [provider]  rosuvastatin (CRESTOR) 20 MG tablet Take 10 mg by mouth daily.     [provider]  tiZANidine (ZANAFLEX) 2 MG tablet Take 2 mg by mouth 2 (two) times daily as needed. 11/17/15   [provider]  valsartan-hydrochlorothiazide (DIOVAN-HCT) 320-25 MG tablet TAKE 1 TABLET BY MOUTH DAILY. 12/30/15   Hoyt Koch, MD  warfarin (COUMADIN) 5 MG tablet TAKE AS DIRECTED BY COUMADIN CLINIC. 08/21/16   [name], MD    Family History Family History  Problem Relation Age of Onset  . Breast cancer Other   . Depression Other        Parent  . Arthritis Other        Parent, Grandparent  . Hypertension Other        Grandparent  . Hyperlipidemia Other        [place]  . Miscarriages / Stillbirths Other        Grandparent  . Stroke Other        [facility]  . Cancer Maternal Uncle        prostate  . Hypertension Maternal Grandfather     Social History Social History  Substance Use Topics  . Smoking status: Never Smoker  . Smokeless tobacco: Never Used  . Alcohol use No     Allergies   Anaprox [naproxen sodium]; Lipitor [atorvastatin]; Meperidine hcl; and Morphine   Review of Systems Review of Systems  10 Systems reviewed and are negative for acute change except as noted in the HPI.   Physical Exam Updated Vital Signs BP (!) 171/72 (BP Location: Right Arm)    Pulse 60   Temp 98.3 F (36.8 C) (Oral)   Resp 16   Ht 5\' 4"  (1.626 m)   Wt 143 lb (64.9 kg)   SpO2 100%   BMI 24.55 kg/m   Physical Exam  Constitutional: She is oriented to person, place, and time. She appears well-developed and well-nourished.  HENT:  Head: Normocephalic.  Eyes: EOM are normal.  Neck: Normal range of motion.  Right posterior lateral neck tenderness.  Cardiovascular: Normal rate.   Pulmonary/Chest: Effort normal.  Abdominal: She exhibits  no distension.  Musculoskeletal: Normal range of motion.  5 out of 5 strength bilateral upper lower extremity major muscle groups.  Neurological: She is alert and oriented to person, place, and time.  Psychiatric: She has a normal mood and affect.  Nursing note and vitals reviewed.    ED Treatments / Results   DIAGNOSTIC STUDIES: Oxygen Saturation is 100% on RA, normal by my interpretation.   COORDINATION OF CARE: 2:35 AM-Discussed next steps with pt. Pt verbalized understanding and is agreeable with the plan.   Labs (all labs ordered are listed, but only abnormal results are displayed) Labs Reviewed - No data to display  EKG  EKG Interpretation None       Radiology Ct Cervical Spine Wo Contrast  Result Date: 09/12/2016 CLINICAL DATA:  Worsening neck pain. EXAM: CT CERVICAL SPINE WITHOUT CONTRAST TECHNIQUE: Multidetector CT imaging of the cervical spine was performed without intravenous contrast. Multiplanar CT image reconstructions were also generated. COMPARISON:  None. FINDINGS: Alignment: Interbody fusion from C5 through C7. The cervical vertebrae are normal in height and alignment. Skull base and vertebrae: No acute fracture. No primary bone lesion or focal pathologic process. Soft tissues and spinal canal: No prevertebral soft tissue abnormality. Central canal is patent. Disc levels: Mild cervical degenerative disc changes with small posterior osteophytes at C2-3 and at C4-5. Moderately severe atlantoaxial  degenerative changes. Probable ankle oasis at the articulation of C1 with the occipital condyles. Facet articulations are moderately arthritic. Posterior encircling wires around the spinous processes at C6-7. Upper chest: No acute findings. Mosaic attenuation suggests air trapping. Other: None IMPRESSION: No acute findings.  Multilevel degenerative changes and effusions. Electronically Signed   By: Andreas Newport M.D.   On: 09/12/2016 04:41    Procedures Procedures (including critical care time)  Medications Ordered in ED Medications  HYDROcodone-acetaminophen (NORCO/VICODIN) 5-325 MG per tablet 1 tablet (1 tablet Oral Given 09/12/16 0247)  cyclobenzaprine (FLEXERIL) tablet 5 mg (5 mg Oral Given 09/12/16 0247)     Initial Impression / Assessment and Plan / ED Course  I have reviewed the triage vital signs and the nursing notes.  Pertinent labs & imaging results that were available during my care of the patient were reviewed by me and considered in my medical decision making (see chart for details).     Patient feels much better at this time.  Primary care follow-up.  CT imaging without acute pathology.  Likely musculoskeletal pain.  Feels better after Vicodin and Flexeril.  Home with same.  No weakness of her arms  Final Clinical Impressions(s) / ED Diagnoses   Final diagnoses:  Neck pain  Acute strain of neck muscle, initial encounter    New Prescriptions New Prescriptions   CYCLOBENZAPRINE (FLEXERIL) 10 MG TABLET    Take 1 tablet (10 mg total) by mouth 3 (three) times daily as needed for muscle spasms.   HYDROCODONE-ACETAMINOPHEN (NORCO/VICODIN) 5-325 MG TABLET    Take 1 tablet by mouth every 4 (four) hours as needed for moderate pain.   I personally performed the services described in this documentation, which was scribed in my presence. The recorded information has been reviewed and is accurate.        Jola Schmidt, MD 09/12/16 [phone]

## 2016-09-12 NOTE — ED Notes (Signed)
Patient transported to CT 

## 2016-09-18 ENCOUNTER — Telehealth: Payer: Self-pay | Admitting: Family Medicine

## 2016-09-18 DIAGNOSIS — M542 Cervicalgia: Secondary | ICD-10-CM

## 2016-09-18 NOTE — Telephone Encounter (Signed)
Referral placed. Will inform pt.

## 2016-09-18 NOTE — Telephone Encounter (Signed)
Ok for referral?

## 2016-09-18 NOTE — Telephone Encounter (Signed)
Ok to place referral for cervicalgia. She may need a follow-up here if Ortho cannot see her in a timely fashion.

## 2016-09-18 NOTE — Telephone Encounter (Signed)
Pt husband calling asking for referral to Ortho. Pt was seen in ER on 6/26 for neck pain and it has not gone away.

## 2016-09-25 ENCOUNTER — Other Ambulatory Visit: Payer: Self-pay | Admitting: Internal Medicine

## 2016-09-30 ENCOUNTER — Other Ambulatory Visit: Payer: Self-pay | Admitting: Family Medicine

## 2016-10-02 ENCOUNTER — Ambulatory Visit (INDEPENDENT_AMBULATORY_CARE_PROVIDER_SITE_OTHER): Payer: Medicare Other | Admitting: General Practice

## 2016-10-02 DIAGNOSIS — Z5181 Encounter for therapeutic drug level monitoring: Secondary | ICD-10-CM

## 2016-10-02 DIAGNOSIS — I2699 Other pulmonary embolism without acute cor pulmonale: Secondary | ICD-10-CM | POA: Diagnosis not present

## 2016-10-02 LAB — POCT INR: INR: 2.1

## 2016-10-02 NOTE — Progress Notes (Signed)
I agree with this plan.

## 2016-10-17 ENCOUNTER — Other Ambulatory Visit: Payer: Self-pay | Admitting: Family Medicine

## 2016-10-30 ENCOUNTER — Ambulatory Visit (INDEPENDENT_AMBULATORY_CARE_PROVIDER_SITE_OTHER): Payer: Medicare Other

## 2016-10-30 DIAGNOSIS — I2699 Other pulmonary embolism without acute cor pulmonale: Secondary | ICD-10-CM

## 2016-10-30 DIAGNOSIS — Z5181 Encounter for therapeutic drug level monitoring: Secondary | ICD-10-CM | POA: Diagnosis not present

## 2016-10-30 LAB — POCT INR: INR: 1.8

## 2016-10-30 NOTE — Patient Instructions (Signed)
Pre visit review using our clinic review tool, if applicable. No additional management support is needed unless otherwise documented below in the visit note. 

## 2016-10-30 NOTE — Progress Notes (Signed)
I have reviewed and agree with note, evaluation, plan.   Stephen Hunter, MD  

## 2016-11-27 ENCOUNTER — Ambulatory Visit (INDEPENDENT_AMBULATORY_CARE_PROVIDER_SITE_OTHER): Payer: Medicare Other | Admitting: General Practice

## 2016-11-27 DIAGNOSIS — Z5181 Encounter for therapeutic drug level monitoring: Secondary | ICD-10-CM

## 2016-11-27 DIAGNOSIS — I2699 Other pulmonary embolism without acute cor pulmonale: Secondary | ICD-10-CM | POA: Diagnosis not present

## 2016-11-27 DIAGNOSIS — Z7901 Long term (current) use of anticoagulants: Secondary | ICD-10-CM | POA: Diagnosis not present

## 2016-11-27 LAB — POCT INR: INR: 2.3

## 2016-11-27 NOTE — Progress Notes (Signed)
I agree with this plan.

## 2016-11-27 NOTE — Patient Instructions (Signed)
Pre visit review using our clinic review tool, if applicable. No additional management support is needed unless otherwise documented below in the visit note. 

## 2016-11-30 ENCOUNTER — Ambulatory Visit: Payer: Medicare Other

## 2016-11-30 ENCOUNTER — Ambulatory Visit (INDEPENDENT_AMBULATORY_CARE_PROVIDER_SITE_OTHER): Payer: Medicare Other | Admitting: Family Medicine

## 2016-11-30 ENCOUNTER — Encounter: Payer: Self-pay | Admitting: Family Medicine

## 2016-11-30 ENCOUNTER — Encounter: Payer: Self-pay | Admitting: General Practice

## 2016-11-30 VITALS — BP 118/68 | HR 67 | Temp 98.1°F | Resp 18 | Ht 64.0 in | Wt 139.6 lb

## 2016-11-30 DIAGNOSIS — E119 Type 2 diabetes mellitus without complications: Secondary | ICD-10-CM | POA: Diagnosis not present

## 2016-11-30 DIAGNOSIS — Z0000 Encounter for general adult medical examination without abnormal findings: Secondary | ICD-10-CM | POA: Diagnosis not present

## 2016-11-30 DIAGNOSIS — Z23 Encounter for immunization: Secondary | ICD-10-CM

## 2016-11-30 LAB — CBC WITH DIFFERENTIAL/PLATELET
BASOS PCT: 0.6 % (ref 0.0–3.0)
Basophils Absolute: 0 10*3/uL (ref 0.0–0.1)
Eosinophils Absolute: 0.1 10*3/uL (ref 0.0–0.7)
Eosinophils Relative: 1.2 % (ref 0.0–5.0)
HCT: 32.6 % — ABNORMAL LOW (ref 36.0–46.0)
Hemoglobin: 10.6 g/dL — ABNORMAL LOW (ref 12.0–15.0)
LYMPHS ABS: 1.9 10*3/uL (ref 0.7–4.0)
Lymphocytes Relative: 34.8 % (ref 12.0–46.0)
MCHC: 32.5 g/dL (ref 30.0–36.0)
MCV: 88.8 fl (ref 78.0–100.0)
MONO ABS: 0.4 10*3/uL (ref 0.1–1.0)
Monocytes Relative: 8.2 % (ref 3.0–12.0)
NEUTROS ABS: 3 10*3/uL (ref 1.4–7.7)
NEUTROS PCT: 55.2 % (ref 43.0–77.0)
PLATELETS: 213 10*3/uL (ref 150.0–400.0)
RBC: 3.66 Mil/uL — ABNORMAL LOW (ref 3.87–5.11)
RDW: 15.8 % — AB (ref 11.5–15.5)
WBC: 5.4 10*3/uL (ref 4.0–10.5)

## 2016-11-30 LAB — HEMOGLOBIN A1C: HEMOGLOBIN A1C: 6.3 % (ref 4.6–6.5)

## 2016-11-30 LAB — HEPATIC FUNCTION PANEL
ALBUMIN: 4.3 g/dL (ref 3.5–5.2)
ALK PHOS: 36 U/L — AB (ref 39–117)
ALT: 20 U/L (ref 0–35)
AST: 36 U/L (ref 0–37)
BILIRUBIN TOTAL: 0.4 mg/dL (ref 0.2–1.2)
Bilirubin, Direct: 0.1 mg/dL (ref 0.0–0.3)
Total Protein: 6.5 g/dL (ref 6.0–8.3)

## 2016-11-30 LAB — BASIC METABOLIC PANEL
BUN: 27 mg/dL — AB (ref 6–23)
CHLORIDE: 106 meq/L (ref 96–112)
CO2: 27 mEq/L (ref 19–32)
Calcium: 9.5 mg/dL (ref 8.4–10.5)
Creatinine, Ser: 1.05 mg/dL (ref 0.40–1.20)
GFR: 54.36 mL/min — ABNORMAL LOW (ref >60.00)
Glucose, Bld: 96 mg/dL (ref 70–99)
POTASSIUM: 4.3 meq/L (ref 3.5–5.1)
Sodium: 140 mEq/L (ref 135–145)

## 2016-11-30 LAB — TSH: TSH: 1.16 u[IU]/mL (ref 0.35–4.50)

## 2016-11-30 LAB — LIPID PANEL
CHOL/HDL RATIO: 4
Cholesterol: 126 mg/dL (ref 0–200)
HDL: 35.8 mg/dL — ABNORMAL LOW (ref >39.00)
LDL Cholesterol: 63 mg/dL (ref 0–99)
NonHDL: 90.34
TRIGLYCERIDES: 138 mg/dL (ref 0.0–149.0)
VLDL: 27.6 mg/dL (ref 0.0–40.0)

## 2016-11-30 MED ORDER — ZOSTER VAC RECOMB ADJUVANTED 50 MCG/0.5ML IM SUSR: 0.5000 mL | Freq: Once | INTRAMUSCULAR | 1 refills | Status: AC

## 2016-11-30 NOTE — Progress Notes (Signed)
   Subjective:    Patient ID: Andrea Simmons, female    DOB: [birth year], 76 y.o.   MRN: [id number]  HPI CPE- UTD on mammo, DEXA.  Due for colonoscopy later this year.  Wants to wait on flu shot.     Review of Systems Patient reports no vision/ hearing changes, adenopathy,fever, weight change,  persistant/recurrent hoarseness , swallowing issues, chest pain, palpitations, edema, persistant/recurrent cough, hemoptysis, dyspnea (rest/exertional/paroxysmal nocturnal), gastrointestinal bleeding (melena, rectal bleeding), abdominal pain, significant heartburn, bowel changes, GU symptoms (dysuria, hematuria, incontinence), Gyn symptoms (abnormal  bleeding, pain),  syncope, focal weakness, memory loss, numbness & tingling, skin/hair/nail changes, abnormal bruising or bleeding, anxiety, or depression.     Objective:   Physical Exam General Appearance:    Alert, cooperative, no distress, appears stated age  Head:    Normocephalic, without obvious abnormality, atraumatic  Eyes:    PERRL, conjunctiva/corneas clear, EOM's intact, fundi    benign, both eyes  Ears:    Normal TM's and external ear canals, both ears  Nose:   Nares normal, septum midline, mucosa normal, no drainage    or sinus tenderness  Throat:   Lips, mucosa, and tongue normal; teeth and gums normal  Neck:   Supple, symmetrical, trachea midline, no adenopathy;    Thyroid: no enlargement/tenderness/nodules  Back:     Symmetric, no curvature, ROM normal, no CVA tenderness  Lungs:     Clear to auscultation bilaterally, respirations unlabored  Chest Wall:    No tenderness or deformity   Heart:    Regular rate and rhythm, S1 and S2 normal, no murmur, rub   or gallop  Breast Exam:    Deferred to mammo  Abdomen:     Soft, non-tender, bowel sounds active all four quadrants,    no masses, no organomegaly  Genitalia:    Deferred  Rectal:    Extremities:   Extremities normal, atraumatic, no cyanosis or edema  Pulses:   2+ and symmetric all  extremities  Skin:   Skin color, texture, turgor normal, no rashes or lesions  Lymph nodes:   Cervical, supraclavicular, and axillary nodes normal  Neurologic:   CNII-XII intact, normal strength, sensation and reflexes    throughout          Assessment & Plan:

## 2016-11-30 NOTE — Progress Notes (Addendum)
Subjective:   Andrea Simmons is a 76 y.o. female who presents for Medicare Annual (Subsequent) preventive examination.  Review of Systems:  No ROS.  Medicare Wellness Visit. Additional risk factors are reflected in the social history.  Cardiac Risk Factors include: advanced age (>79men, >40 women);diabetes mellitus;dyslipidemia;hypertension   Sleep patterns: Sleeps 6 hours.  Home Safety/Smoke Alarms: Feels safe in home. Smoke alarms in place.  Living environment; residence and Firearm Safety: Lives with husband in 1 story home.  Seat Belt Safety/Bike Helmet: Wears seat belt.   Female:   [id number]       Mammo-06/15/2016, negative.       Dexa scan-06/15/2016, Osteopenia.        CCS-Colonoscopy 03/08/2007, normal.      Objective:     Vitals: BP 118/68 (BP Location: Left Arm, Patient Position: Sitting, Cuff Size: Normal)   Pulse 67   Temp 98.1 F (36.7 C) (Temporal)   Resp 18   Ht 5\' 4"  (1.626 m)   Wt 139 lb 9.6 oz (63.3 kg)   SpO2 97%   BMI 23.96 kg/m   Body mass index is 23.96 kg/m.   Tobacco History  Smoking Status  . Never Smoker  Smokeless Tobacco  . Never Used     Counseling given: Not Answered   Past Medical History:  Diagnosis Date  . Anemia   . Anxiety   . Breast cancer ([place])   . Depression   . Diabetes mellitus type II   . Fibromyalgia   . GERD (gastroesophageal reflux disease)   . Hyperlipidemia   . Hypertension   . Malignant neoplasm of breast (female), unspecified site 1993   L breast s/p mastectomy and tamoxifen x 68yrs  . 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 Unspecified hypothyroidism    Past Surgical History:  Procedure Laterality Date  . APPENDECTOMY    . BREAST BIOPSY Left 1993  . BREAST BIOPSY Right    stero. Benign  . BREAST RECONSTRUCTION Left   . CATARACT EXTRACTION    . MASTECTOMY Left   . ROTATOR CUFF REPAIR    . SPINAL FUSION      x 2  . TOTAL ABDOMINAL HYSTERECTOMY W/  BILATERAL SALPINGOOPHORECTOMY    . TUBAL LIGATION     Family History  Problem Relation Age of Onset  . Breast cancer Other   . Depression Other        Parent  . Arthritis Other        Parent, Grandparent  . Hypertension Other        Grandparent  . Hyperlipidemia Other        [place]  . Miscarriages / Stillbirths Other        Grandparent  . Stroke Other        [facility]  . Cancer Maternal Uncle        prostate  . Hypertension Maternal Grandfather    History  Sexual Activity  . Sexual activity: Not on file    Outpatient Encounter Prescriptions as of 11/30/2016  Medication Sig  . aspirin 81 MG tablet Take 81 mg by mouth daily.    Marland Kitchen BIOTIN PO Take by mouth.  . calcium carbonate (OS-CAL) 600 MG TABS Take 1,200 mg by mouth daily.    . celecoxib (CELEBREX) 200 MG capsule TAKE ONE CAPSULE BY MOUTH EVERY DAY  . Cholecalciferol (VITAMIN D3) 3000 units TABS Take by mouth.  . Choline Fenofibrate  135 MG capsule Take 135 mg by mouth daily.    . DULoxetine (CYMBALTA) 60 MG capsule Take 60 mg by mouth 2 (two) times daily.  . Ferrous Sulfate (IRON) 325 (65 Fe) MG TABS Take by mouth.  Marland Kitchen HORIZANT 600 MG TBCR TAKE 1 TABLET EVERY NIGHT WITH DINNER  . levothyroxine (SYNTHROID, LEVOTHROID) 50 MCG tablet Take 50 mcg by mouth daily before breakfast.  . Magnesium 500 MG TABS Take by mouth.  . metFORMIN (GLUCOPHAGE-XR) 500 MG 24 hr tablet Take 1,000 mg by mouth 2 (two) times daily.  . Multiple Vitamin (MULTIVITAMIN) tablet Take 1 tablet by mouth daily.    . Omega-3 Fatty Acids (FISH OIL) 500 MG CAPS Take 1 capsule by mouth 2 (two) times daily.   Marland Kitchen omeprazole (PRILOSEC) 20 MG capsule TAKE ONE CAPSULE BY MOUTH EVERY DAY  . ONE TOUCH ULTRA TEST test strip CHECK BLOOD SUGAR TWICE DAILY  . ONETOUCH DELICA LANCETS MISC Use as directed  . RESTASIS MULTIDOSE 0.05 % ophthalmic emulsion INSTILL 1 DROP BY OPHTHALMIC ROUTE EVERY 12 HOURS INTO AFFECTED EYE(S)  . rosuvastatin (CRESTOR) 20 MG tablet Take 10 mg by mouth  daily.   . valsartan-hydrochlorothiazide (DIOVAN-HCT) 320-25 MG tablet TAKE 1 TABLET BY MOUTH DAILY.  Marland Kitchen warfarin (COUMADIN) 5 MG tablet TAKE AS DIRECTED BY COUMADIN CLINIC.  Marland Kitchen cyclobenzaprine (FLEXERIL) 10 MG tablet Take 1 tablet (10 mg total) by mouth 3 (three) times daily as needed for muscle spasms. (Patient not taking: Reported on 11/30/2016)  . FIBER ADULT GUMMIES 2 g CHEW Chew by mouth.  Marland Kitchen HYDROcodone-acetaminophen (NORCO/VICODIN) 5-325 MG tablet Take 1 tablet by mouth every 4 (four) hours as needed for moderate pain. (Patient not taking: Reported on 11/30/2016)  . tiZANidine (ZANAFLEX) 2 MG tablet Take 2 mg by mouth 2 (two) times daily as needed.  . triamcinolone cream (KENALOG) 0.5 % APPLY 1 APPLICATION TWICE A DAY  . Zoster Vac Recomb Adjuvanted [facility]) injection Inject 0.5 mLs into the muscle once.   No facility-administered encounter medications on file as of 11/30/2016.     Activities of Daily Living In your present state of health, do you have any difficulty performing the following activities: 11/30/2016 04/20/2016  Hearing? N N  Vision? N N  Difficulty concentrating or making decisions? N N  Walking or climbing stairs? N N  Dressing or bathing? N N  Doing errands, shopping? N N  Preparing Food and eating ? N -  Using the Toilet? N -  In the past six months, have you accidently leaked urine? N -  Do you have problems with loss of bowel control? N -  Managing your Medications? N -  Managing your Finances? N -  Housekeeping or managing your Housekeeping? N -  Some recent data might be hidden    Patient Care Team: [name], MD as PCP - General (Family Medicine) Irene Shipper, MD as Consulting Physician (Gastroenterology) Deneise Lever, MD as Consulting Physician (Pulmonary Disease) Jacelyn Pi, MD as Consulting Physician (Endocrinology) Valinda Party, MD as Consulting Physician (Rheumatology) [name], Billey Co., MD as Attending Physician  (Psychiatry) Renie Ora, MD as Referring Physician (Anesthesiology)    Assessment:    Physical assessment deferred to PCP.  Exercise Activities and Dietary recommendations Current Exercise Habits: The patient does not participate in regular exercise at present (Housekeeping), Exercise limited by: None identified   Diet (meal preparation, eat out, water intake, caffeinated beverages, dairy products, fruits and vegetables): Drinks diet soda and  water.   Breakfast: Fruit, english muffin Lunch: salad, sandwich Dinner: protein, vegetables.      Goals      Patient Stated   . patient (pt-stated)          Continue to be active.       Fall Risk Fall Risk  11/30/2016 04/20/2016 11/03/2013 09/18/2012  Falls in the past year? No No No No   Depression Screen PHQ 2/9 Scores 11/30/2016 04/20/2016 11/03/2013 09/18/2012  PHQ - 2 Score 2 1 0 0  PHQ- 9 Score 9 1 - -     Cognitive Function MMSE - Mini Mental State Exam 11/20/2014  Orientation to time 5  Orientation to Place 5  Registration 3  Attention/ Calculation 5  Recall 2  Language- name 2 objects 2  Language- repeat 1  Language- follow 3 step command 3  Language- read & follow direction 1  Write a sentence 1  Copy design 1  Total score 29   Ad8 score reviewed for issues:  Issues making decisions:no  Less interest in hobbies / activities: no  Repeats questions, stories (family complaining): no  Trouble using ordinary gadgets (microwave, computer, phone): no  Forgets the month or year: no  Mismanaging finances: no  Remembering appts: no  Daily problems with thinking and/or memory: no Ad8 score is=0          Immunization History  Administered Date(s) Administered  . Influenza Whole 03/21/2007, 12/18/2008, 01/20/2010  . Influenza, High Dose Seasonal PF 01/19/2015, 02/21/2016  . Influenza,inj,Quad PF,6+ Mos 01/22/2013  . Influenza-Unspecified 02/18/2012  . Pneumococcal Conjugate-13 04/20/2016  . Pneumococcal  Polysaccharide-23 03/20/2006, 12/19/2007  . Td 08/27/2008  . Zoster 12/11/2012   Screening Tests Health Maintenance  Topic Date Due  . INFLUENZA VACCINE  10/18/2016  . FOOT EXAM  12/14/2016  . HEMOGLOBIN A1C  02/16/2017  . COLONOSCOPY  03/07/2017  . OPHTHALMOLOGY EXAM  06/16/2017  . MAMMOGRAM  06/16/2018  . TETANUS/TDAP  08/28/2018  . DEXA SCAN  Completed  . PNA vac Low Risk Adult  Completed   Diabetic Foot Exam - Simple   Simple Foot Form Diabetic Foot exam was performed with the following findings:  Yes 11/30/2016  9:40 AM  Visual Inspection No deformities, no ulcerations, no other skin breakdown bilaterally:  Yes Sensation Testing Intact to touch and monofilament testing bilaterally:  Yes Pulse Check Posterior Tibialis and Dorsalis pulse intact bilaterally:  Yes Comments        Plan:     Shingles vaccine at pharmacy  Bring a copy of your living will and/or healthcare power of attorney to your next office visit.  Continue doing brain stimulating activities (puzzles, reading, adult coloring books, staying active) to keep memory sharp.   I have personally reviewed and noted the following in the patient's chart:   . Medical and social history . Use of alcohol, tobacco or illicit drugs  . Current medications and supplements . Functional ability and status . Nutritional status . Physical activity . Advanced directives . List of other physicians . Hospitalizations, surgeries, and ER visits in previous 12 months . Vitals . Screenings to include cognitive, depression, and falls . Referrals and appointments  In addition, I have reviewed and discussed with patient certain preventive protocols, quality metrics, and best practice recommendations. A written personalized care plan for preventive services as well as general preventive health recommendations were provided to patient.     Gerilyn Nestle, RN  11/30/2016  Reviewed documentation and agree w/  above.   Annye Asa, MD

## 2016-11-30 NOTE — Assessment & Plan Note (Signed)
Chronic problem.  Pt is following w/ Dr Chalmers Cater but I am not getting information from that office.  Check labs today.  Pt is UTD on eye exam, on ARB for renal protection.  Foot exam done today.  Will follow along.

## 2016-11-30 NOTE — Patient Instructions (Addendum)
Follow up in 6 months to recheck BP and cholesterol We'll notify you of your lab results and make any changes if needed You are up to date on mammogram and bone density- yay!!! We'll see if Dr Henrene Pastor wants to do a recall colonoscopy If you need me to complete any paperwork for your eye surgery- please have them fax it to me When you get your flu shot, let me know so we can update your chart Continue to work on healthy diet and regular exercise- you look great! Call with any questions or concerns Stay safe this weekend!!!   Shingles vaccine at pharmacy  Bring a copy of your living will and/or healthcare power of attorney to your next office visit.  Continue doing brain stimulating activities (puzzles, reading, adult coloring books, staying active) to keep memory sharp.   Health Maintenance, Female Adopting a healthy lifestyle and getting preventive care can go a long way to promote health and wellness. Talk with your health care provider about what schedule of regular examinations is right for you. This is a good chance for you to check in with your provider about disease prevention and staying healthy. In between checkups, there are plenty of things you can do on your own. Experts have done a lot of research about which lifestyle changes and preventive measures are most likely to keep you healthy. Ask your health care provider for more information. Weight and diet Eat a healthy diet  Be sure to include plenty of vegetables, fruits, low-fat dairy products, and lean protein.  Do not eat a lot of foods high in solid fats, added sugars, or salt.  Get regular exercise. This is one of the most important things you can do for your health. ? Most adults should exercise for at least 150 minutes each week. The exercise should increase your heart rate and make you sweat (moderate-intensity exercise). ? Most adults should also do strengthening exercises at least twice a week. This is in addition to  the moderate-intensity exercise.  Maintain a healthy weight  Body mass index (BMI) is a measurement that can be used to identify possible weight problems. It estimates body fat based on height and weight. Your health care provider can help determine your BMI and help you achieve or maintain a healthy weight.  For females 72 years of age and older: ? A BMI below 18.5 is considered underweight. ? A BMI of 18.5 to 24.9 is normal. ? A BMI of 25 to 29.9 is considered overweight. ? A BMI of 30 and above is considered obese.  Watch levels of cholesterol and blood lipids  You should start having your blood tested for lipids and cholesterol at 74 years of age, then have this test every 5 years.  You may need to have your cholesterol levels checked more often if: ? Your lipid or cholesterol levels are high. ? You are older than 74 years of age. ? You are at high risk for heart disease.  Cancer screening Lung Cancer  Lung cancer screening is recommended for adults 45-53 years old who are at high risk for lung cancer because of a history of smoking.  A yearly low-dose CT scan of the lungs is recommended for people who: ? Currently smoke. ? Have quit within the past 15 years. ? Have at least a 30-pack-year history of smoking. A pack year is smoking an average of one pack of cigarettes a day for 1 year.  Yearly screening should continue until it  has been 15 years since you quit.  Yearly screening should stop if you develop a health problem that would prevent you from having lung cancer treatment.  Breast Cancer  Practice breast self-awareness. This means understanding how your breasts normally appear and feel.  It also means doing regular breast self-exams. Let your health care provider know about any changes, no matter how small.  If you are in your 20s or 30s, you should have a clinical breast exam (CBE) by a health care provider every 1-3 years as part of a regular health exam.  If  you are 60 or older, have a CBE every year. Also consider having a breast X-ray (mammogram) every year.  If you have a family history of breast cancer, talk to your health care provider about genetic screening.  If you are at high risk for breast cancer, talk to your health care provider about having an MRI and a mammogram every year.  Breast cancer gene (BRCA) assessment is recommended for women who have family members with BRCA-related cancers. BRCA-related cancers include: ? Breast. ? Ovarian. ? Tubal. ? Peritoneal cancers.  Results of the assessment will determine the need for genetic counseling and BRCA1 and BRCA2 testing.  Cervical Cancer Your health care provider may recommend that you be screened regularly for cancer of the pelvic organs (ovaries, uterus, and vagina). This screening involves a pelvic examination, including checking for microscopic changes to the surface of your cervix (Pap test). You may be encouraged to have this screening done every 3 years, beginning at age 87.  For women ages 36-65, health care providers may recommend pelvic exams and Pap testing every 3 years, or they may recommend the Pap and pelvic exam, combined with testing for human papilloma virus (HPV), every 5 years. Some types of HPV increase your risk of cervical cancer. Testing for HPV may also be done on women of any age with unclear Pap test results.  Other health care providers may not recommend any screening for nonpregnant women who are considered low risk for pelvic cancer and who do not have symptoms. Ask your health care provider if a screening pelvic exam is right for you.  If you have had past treatment for cervical cancer or a condition that could lead to cancer, you need Pap tests and screening for cancer for at least 20 years after your treatment. If Pap tests have been discontinued, your risk factors (such as having a new sexual partner) need to be reassessed to determine if screening should  resume. Some women have medical problems that increase the chance of getting cervical cancer. In these cases, your health care provider may recommend more frequent screening and Pap tests.  Colorectal Cancer  This type of cancer can be detected and often prevented.  Routine colorectal cancer screening usually begins at 74 years of age and continues through 74 years of age.  Your health care provider may recommend screening at an earlier age if you have risk factors for colon cancer.  Your health care provider may also recommend using home test kits to check for hidden blood in the stool.  A small camera at the end of a tube can be used to examine your colon directly (sigmoidoscopy or colonoscopy). This is done to check for the earliest forms of colorectal cancer.  Routine screening usually begins at age 30.  Direct examination of the colon should be repeated every 5-10 years through 74 years of age. However, you may need to be  screened more often if early forms of precancerous polyps or small growths are found.  Skin Cancer  Check your skin from head to toe regularly.  Tell your health care provider about any new moles or changes in moles, especially if there is a change in a mole's shape or color.  Also tell your health care provider if you have a mole that is larger than the size of a pencil eraser.  Always use sunscreen. Apply sunscreen liberally and repeatedly throughout the day.  Protect yourself by wearing long sleeves, pants, a wide-brimmed hat, and sunglasses whenever you are outside.  Heart disease, diabetes, and high blood pressure  High blood pressure causes heart disease and increases the risk of stroke. High blood pressure is more likely to develop in: ? People who have blood pressure in the high end of the normal range (130-139/85-89 mm Hg). ? People who are overweight or obese. ? People who are African American.  If you are 7-10 years of age, have your blood  pressure checked every 3-5 years. If you are 32 years of age or older, have your blood pressure checked every year. You should have your blood pressure measured twice-once when you are at a hospital or clinic, and once when you are not at a hospital or clinic. Record the average of the two measurements. To check your blood pressure when you are not at a hospital or clinic, you can use: ? An automated blood pressure machine at a pharmacy. ? A home blood pressure monitor.  If you are between 55 years and 78 years old, ask your health care provider if you should take aspirin to prevent strokes.  Have regular diabetes screenings. This involves taking a blood sample to check your fasting blood sugar level. ? If you are at a normal weight and have a low risk for diabetes, have this test once every three years after 74 years of age. ? If you are overweight and have a high risk for diabetes, consider being tested at a younger age or more often. Preventing infection Hepatitis B  If you have a higher risk for hepatitis B, you should be screened for this virus. You are considered at high risk for hepatitis B if: ? You were born in a country where hepatitis B is common. Ask your health care provider which countries are considered high risk. ? Your parents were born in a high-risk country, and you have not been immunized against hepatitis B (hepatitis B vaccine). ? You have HIV or AIDS. ? You use needles to inject street drugs. ? You live with someone who has hepatitis B. ? You have had sex with someone who has hepatitis B. ? You get hemodialysis treatment. ? You take certain medicines for conditions, including cancer, organ transplantation, and autoimmune conditions.  Hepatitis C  Blood testing is recommended for: ? Everyone born from [birth year] through 1963. ? Anyone with known risk factors for hepatitis C.  Sexually transmitted infections (STIs)  You should be screened for sexually transmitted  infections (STIs) including gonorrhea and chlamydia if: ? You are sexually active and are younger than 74 years of age. ? You are older than 74 years of age and your health care provider tells you that you are at risk for this type of infection. ? Your sexual activity has changed since you were last screened and you are at an increased risk for chlamydia or gonorrhea. Ask your health care provider if you are at risk.  If  you do not have HIV, but are at risk, it may be recommended that you take a prescription medicine daily to prevent HIV infection. This is called pre-exposure prophylaxis (PrEP). You are considered at risk if: ? You are sexually active and do not regularly use condoms or know the HIV status of your partner(s). ? You take drugs by injection. ? You are sexually active with a partner who has HIV.  Talk with your health care provider about whether you are at high risk of being infected with HIV. If you choose to begin PrEP, you should first be tested for HIV. You should then be tested every 3 months for as long as you are taking PrEP. Pregnancy  If you are premenopausal and you may become pregnant, ask your health care provider about preconception counseling.  If you may become pregnant, take 400 to 800 micrograms (mcg) of folic acid every day.  If you want to prevent pregnancy, talk to your health care provider about birth control (contraception). Osteoporosis and menopause  Osteoporosis is a disease in which the bones lose minerals and strength with aging. This can result in serious bone fractures. Your risk for osteoporosis can be identified using a bone density scan.  If you are 50 years of age or older, or if you are at risk for osteoporosis and fractures, ask your health care provider if you should be screened.  Ask your health care provider whether you should take a calcium or vitamin D supplement to lower your risk for osteoporosis.  Menopause may have certain physical  symptoms and risks.  Hormone replacement therapy may reduce some of these symptoms and risks. Talk to your health care provider about whether hormone replacement therapy is right for you. Follow these instructions at home:  Schedule regular health, dental, and eye exams.  Stay current with your immunizations.  Do not use any tobacco products including cigarettes, chewing tobacco, or electronic cigarettes.  If you are pregnant, do not drink alcohol.  If you are breastfeeding, limit how much and how often you drink alcohol.  Limit alcohol intake to no more than 1 drink per day for nonpregnant women. One drink equals 12 ounces of beer, 5 ounces of wine, or 1 ounces of hard liquor.  Do not use street drugs.  Do not share needles.  Ask your health care provider for help if you need support or information about quitting drugs.  Tell your health care provider if you often feel depressed.  Tell your health care provider if you have ever been abused or do not feel safe at home. This information is not intended to replace advice given to you by your health care provider. Make sure you discuss any questions you have with your health care provider. Document Released: 09/19/2010 Document Revised: 08/12/2015 Document Reviewed: 12/08/2014 Elsevier Interactive Patient Education  [employer].

## 2016-11-30 NOTE — Assessment & Plan Note (Signed)
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  She is due for repeat colonoscopy at the end of the year but she is not sure if she is interested since she will be turning 76.  UTD on mammo, eye exam, immunizations.  Check labs.  Anticipatory guidance provided.

## 2016-12-01 ENCOUNTER — Other Ambulatory Visit: Payer: Self-pay | Admitting: Internal Medicine

## 2016-12-01 NOTE — Telephone Encounter (Signed)
Routing to [employer] tabori patients PCP. Please advise. thanks

## 2016-12-13 ENCOUNTER — Ambulatory Visit: Payer: BC Managed Care – PPO | Admitting: Podiatry

## 2016-12-25 ENCOUNTER — Ambulatory Visit (INDEPENDENT_AMBULATORY_CARE_PROVIDER_SITE_OTHER): Payer: Medicare Other | Admitting: General Practice

## 2016-12-25 DIAGNOSIS — Z7901 Long term (current) use of anticoagulants: Secondary | ICD-10-CM | POA: Diagnosis not present

## 2016-12-25 DIAGNOSIS — I2699 Other pulmonary embolism without acute cor pulmonale: Secondary | ICD-10-CM | POA: Diagnosis not present

## 2016-12-25 LAB — POCT INR: INR: 3

## 2016-12-25 NOTE — Patient Instructions (Signed)
Pre visit review using our clinic review tool, if applicable. No additional management support is needed unless otherwise documented below in the visit note. 

## 2016-12-25 NOTE — Progress Notes (Signed)
I agree with this plan.

## 2017-01-22 ENCOUNTER — Ambulatory Visit: Payer: Medicare Other | Admitting: General Practice

## 2017-01-22 DIAGNOSIS — Z23 Encounter for immunization: Secondary | ICD-10-CM

## 2017-01-22 DIAGNOSIS — Z7901 Long term (current) use of anticoagulants: Secondary | ICD-10-CM | POA: Diagnosis not present

## 2017-01-22 LAB — POCT INR: INR: 3.8

## 2017-01-22 NOTE — Patient Instructions (Signed)
Pre visit review using our clinic review tool, if applicable. No additional management support is needed unless otherwise documented below in the visit note. 

## 2017-01-22 NOTE — Progress Notes (Signed)
I agree with this plan.

## 2017-02-14 ENCOUNTER — Ambulatory Visit: Payer: Medicare Other | Admitting: General Practice

## 2017-02-14 DIAGNOSIS — Z7901 Long term (current) use of anticoagulants: Secondary | ICD-10-CM | POA: Diagnosis not present

## 2017-02-14 DIAGNOSIS — I2699 Other pulmonary embolism without acute cor pulmonale: Secondary | ICD-10-CM | POA: Diagnosis not present

## 2017-02-14 LAB — POCT INR: INR: 1.8

## 2017-02-14 NOTE — Patient Instructions (Signed)
Pre visit review using our clinic review tool, if applicable. No additional management support is needed unless otherwise documented below in the visit note.  Take 1 1/2 tablets today (11/28) and then continue to take 1/2 pill (2.5mg ) daily EXCEPT for 1 pill (5mg ) on Wednesdays and Saturdays. Re-check in 4 weeks.

## 2017-02-14 NOTE — Progress Notes (Signed)
I agree with this plan.

## 2017-02-19 ENCOUNTER — Other Ambulatory Visit: Payer: Self-pay | Admitting: Family Medicine

## 2017-02-23 ENCOUNTER — Other Ambulatory Visit: Payer: Self-pay | Admitting: Physician Assistant

## 2017-02-23 NOTE — Telephone Encounter (Signed)
Will defer further refills of patient's medications to PCP  

## 2017-03-19 ENCOUNTER — Ambulatory Visit: Payer: Medicare Other | Admitting: General Practice

## 2017-03-19 DIAGNOSIS — Z7901 Long term (current) use of anticoagulants: Secondary | ICD-10-CM | POA: Diagnosis not present

## 2017-03-19 DIAGNOSIS — I2699 Other pulmonary embolism without acute cor pulmonale: Secondary | ICD-10-CM

## 2017-03-19 LAB — POCT INR: INR: 2.3

## 2017-03-19 NOTE — Patient Instructions (Addendum)
Pre visit review using our clinic review tool, if applicable. No additional management support is needed unless otherwise documented below in the visit note.  Continue to take 1/2 pill (2.5mg ) daily EXCEPT for 1 pill (5mg ) on Wednesdays and Saturdays. Re-check in 4 weeks.  Please follow instructions from surgeon in regards to stopping and re-starting coumadin.  Surgeon also to dose Lovenox according to patient.

## 2017-03-23 ENCOUNTER — Encounter: Payer: Self-pay | Admitting: Internal Medicine

## 2017-03-29 ENCOUNTER — Other Ambulatory Visit: Payer: Self-pay | Admitting: General Practice

## 2017-03-29 ENCOUNTER — Telehealth: Payer: Self-pay | Admitting: General Practice

## 2017-03-29 MED ORDER — ENOXAPARIN SODIUM 100 MG/ML ~~LOC~~ SOLN: 100.0000 mg | SUBCUTANEOUS | 0 refills | Status: DC

## 2017-03-29 NOTE — Telephone Encounter (Signed)
Noted  

## 2017-03-29 NOTE — Telephone Encounter (Signed)
Spoke with patient. She advised that the surgery is elective it is for blepharoplasty. The Eye doctor thinks that she would be a good candidate, pt would like to proceed if ok with PCP because she thinks that it would help her vision.   Pt was advised by Coumadin clinic to take her coumadin today and to begin lovenox immediately. Per PCP pt was advised to hold coumadin until she is seen tomorrow at 2pm. We will check her INR at that time.   Decision about the surgery will be made tomorrow per PCP.

## 2017-03-29 NOTE — Telephone Encounter (Signed)
-----   Message from [name], MD sent at 03/29/2017  2:26 PM EST ----- Regarding: RE: Coumadin and Lovenox Bridge for patient Why has she been off Coumadin x11 days???  At this point, are they able to do the procedure tomorrow and she can do the Lovenox bridge the day after?  It's concerning she hasn't had meds for this long!  If she is truly canceling the procedure, I would send Lovenox for the next few days.  KT ----- Message ----- From: Warden Fillers, RN Sent: 03/29/2017   2:19 PM To: [name], MD Subject: RE: Coumadin and Lovenox Bridge for patient    I just spoke with patient and she is going to cancel the procedure.  Pt says that she hasn't had coumadin since 12/31.  Do you think I should call in maybe 3 lovenox syringes for patient until she gets coumadin back in her system?  I went ahead and instructed her to start coumadin today. ----- Message ----- From: [name], MD Sent: 03/29/2017   2:12 PM To: Warden Fillers, RN Subject: RE: Coumadin and Lovenox Bridge for patient    I typically type a generic letter indicating that 'I reviewed the risks of stopping Coumadin for her upcoming procedure, including but not limited to clot, PE, stroke, and even death.  Pt is aware of these risks and wants to proceed w/ stopping coumadin for the procedure and then initiating a Lovenox bridge.'  Keep it simple but also cover ourselves if something bad were to happen  KT   ----- Message ----- From: Warden Fillers, RN Sent: 03/29/2017   2:09 PM To: [name], MD Subject: RE: Coumadin and Lovenox Bridge for patient    Is a telephone note sufficient for a signed note or did you have something else in mind? 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  So it  seems she is high risk.  I am nervous about the idea of her stopping for any amount of time so I wonder if the eye procedure is absolutely necessary.  If she must proceed with the procedure, she needs to be aware of the risks (I would make her sign something to that effect) and a Lovenox bridge is absolutely necessary.  I hope this helps! KT, MD ----- Message ----- From: Warden Fillers, RN Sent: 03/29/2017   1:17 PM To: Warden Fillers, RN, [name], MD Subject: Coumadin and Lovenox Bridge for patient        Hi Dr. Birdie Riddle,  Patient is scheduled for a Bilateral Blepharoplasty under IV sedation on 1/16.  Are you OK with patient stopping comadin for 5 days?  Also patient I believe will need a Lovenox bridge.  If OK with you I will dose the Lovenox and call in to the pharmacy.  Please let me know if you agree with the plan as your earliest convenience.  Thanks, [name], RN [facility]

## 2017-03-29 NOTE — Telephone Encounter (Signed)
----- Message from [name], MD sent at 03/29/2017  2:53 PM EST ----- Regarding: FW: Coumadin and Lovenox Bridge for patient Please call [facility] and see if she is able to have the procedure ASAP since she has been off coumadin x11 days!  KT ----- Message ----- From: Warden Fillers, RN Sent: 03/29/2017   2:38 PM To: [name], MD Subject: RE: Coumadin and Lovenox Bridge for patient    Patient states that the surgery center told her to stop the coumadin.  I believe that she misunderstood.  I have a call in to [facility] to clarify some information. I will call in 3 lovenox syringes.   ----- Message ----- From: [name], MD Sent: 03/29/2017   2:26 PM To: Warden Fillers, RN Subject: RE: Coumadin and Lovenox Bridge for patient    Why has she been off Coumadin x11 days???  At this point, are they able to do the procedure tomorrow and she can do the Lovenox bridge the day after?  It's concerning she hasn't had meds for this long!  If she is truly canceling the procedure, I would send Lovenox for the next few days.  KT ----- Message ----- From: Warden Fillers, RN Sent: 03/29/2017   2:19 PM To: [name], MD Subject: RE: Coumadin and Lovenox Bridge for patient    I just spoke with patient and she is going to cancel the procedure.  Pt says that she hasn't had coumadin since 12/31.  Do you think I should call in maybe 3 lovenox syringes for patient until she gets coumadin back in her system?  I went ahead and instructed her to start coumadin today. ----- Message ----- From: [name], MD Sent: 03/29/2017   2:12 PM To: Warden Fillers, RN Subject: RE: Coumadin and Lovenox Bridge for patient    I typically type a generic letter indicating that 'I reviewed the risks of stopping Coumadin for her upcoming procedure, including but not limited to clot, PE, stroke, and even death.  Pt is aware of these risks and wants to proceed w/ stopping  coumadin for the procedure and then initiating a Lovenox bridge.'  Keep it simple but also cover ourselves if something bad were to happen  KT   ----- Message ----- From: Warden Fillers, RN Sent: 03/29/2017   2:09 PM To: [name], MD Subject: RE: Coumadin and Lovenox Bridge for patient    Is a telephone note sufficient for a signed note or did you have something else in mind? 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  So it seems she is high risk.  I am nervous about the idea of her stopping for any amount of time so I wonder if the eye procedure is absolutely necessary.  If she must proceed with the procedure, she needs to be aware of the risks (I would make her sign something to that effect) and a Lovenox bridge is absolutely necessary.  I hope this helps! KT, MD ----- Message ----- From: Warden Fillers, RN Sent: 03/29/2017   1:17 PM To: Warden Fillers, RN, [name], MD Subject: Coumadin and Lovenox Bridge for patient        [facility]  Dr. Birdie Riddle,  Patient is scheduled for a Bilateral Blepharoplasty under IV sedation on 1/16.  Are you OK with patient stopping comadin for 5 days?  Also patient I believe will need a Lovenox bridge.  If OK with you I will dose the Lovenox and call in to the pharmacy.  Please let me know if you agree with the plan as your earliest convenience.  Thanks, [name], RN Coumadin Clinic

## 2017-03-29 NOTE — Telephone Encounter (Signed)
Patient states that she was instructed to stop per surgery center. Patient does not want to proceed with procedure at this time.  Patient has been given instructions to re-start coumadin with a Lovenox bridge.  Patient verbalizes understanding.

## 2017-03-29 NOTE — Telephone Encounter (Signed)
We will call [facility] tomorrow to determine if the procedure can be done on Monday as this would be 14 days off medication.  If not, we will likely need to start Lovenox and restart Coumadin until a date is set.

## 2017-03-30 ENCOUNTER — Ambulatory Visit: Payer: Medicare Other | Admitting: Family Medicine

## 2017-03-30 ENCOUNTER — Encounter: Payer: Self-pay | Admitting: Family Medicine

## 2017-03-30 ENCOUNTER — Other Ambulatory Visit: Payer: Self-pay

## 2017-03-30 VITALS — BP 138/84 | HR 72 | Temp 97.9°F | Resp 16 | Ht 64.0 in | Wt 144.1 lb

## 2017-03-30 DIAGNOSIS — Z86711 Personal history of pulmonary embolism: Secondary | ICD-10-CM | POA: Diagnosis not present

## 2017-03-30 DIAGNOSIS — Z7901 Long term (current) use of anticoagulants: Secondary | ICD-10-CM

## 2017-03-30 LAB — POCT INR: INR: 1

## 2017-03-30 NOTE — Patient Instructions (Signed)
Follow up as needed or as scheduled Continue to hold your coumadin until the day after your procedure (Tuesday) Start the Lovenox bridge (along w/ your Coumadin) on Tuesday morning after your procedure Follow up with [name] as scheduled on Wednesday (take your Lovenox and Coumadin that morning) If you develop any leg swelling, pain, redness, shortness of breath or other concerns, please go to the ER IMMEDIATELY!!! Call with any questions or concerns Good luck on Monday! We love you!!!

## 2017-03-30 NOTE — Progress Notes (Signed)
   Subjective:    Patient ID: Andrea Simmons, female    DOB: 01-10-1941, 77 y.o.   MRN: [id number]  HPI Chronic coumadin use- pt reports she was told by [facility] that she needed to stop her Coumadin for 14 days before surgery.  Last time pt stopped Coumadin for 5 days (2008- for endoscopy) she developed a PE.  Pt is to have blepharoplasty in the near future.  Pt is scheduled for 1/16 (per pt)  Pt reports, 'I didn't ask for this.  My eye doctor recommended it'.  Pt would like to proceed w/ surgery, especially since she has been off her coumadin since 03/20/17.  She denies leg pain, swelling, SOB, chest pain.   Review of Systems For ROS see HPI     Objective:   Physical Exam  Constitutional: She is oriented to person, place, and time. She appears well-developed and well-nourished. No distress.  HENT:  Head: Normocephalic and atraumatic.  Cardiovascular: Normal rate, regular rhythm, normal heart sounds and intact distal pulses.  Pulmonary/Chest: Effort normal and breath sounds normal. No respiratory distress. She has no wheezes. She has no rales.  Musculoskeletal: She exhibits no edema or tenderness.  Neurological: She is alert and oriented to person, place, and time.  Skin: Skin is warm and dry. No erythema.  Psychiatric: She has a normal mood and affect. Her behavior is normal. Thought content normal.  Vitals reviewed.         Assessment & Plan:  Chronic coumadin use- pt stopped her Coumadin on 03/20/17 for a blepharoplasty scheduled on 1/16.  The last time she stopped her Coumadin (for 5 days for Endoscopy) she developed a PE.  This procedure was discussed last year and she was sent to hematology for an evaluation and they recommended at that time that she forgo this elective procedure.  Since she has already been off Coumadin x12 days, a call was placed to [facility] to see if they are able to do her surgery on Monday AM so she is able to restart her anticoagulation- Coumadin  w/ Lovenox bridge- ASAP.  We also discussed with [place] that 14 days is not the standard of care and that it should be 5 days off coumadin prior to surgery.  At time of visit, [facility] agreed to move pt's procedure to Monday 1/14 at 11:30am.  Pt was aware to continue to hold her coumadin but to resume her coumadin and start the Lovenox Tuesday AM and f/u w/ Coumadin Clinic on Wednesday as scheduled.  Red flags reviewed w/ pt and instructions given that she should go to ER if she developed leg pain, swelling, redness, CP, or SOB.  Total time spent w/ pt >30 minutes w/ more than 50% spent counseling.

## 2017-04-02 ENCOUNTER — Telehealth: Payer: Self-pay | Admitting: General Practice

## 2017-04-02 ENCOUNTER — Telehealth: Payer: Self-pay | Admitting: Family Medicine

## 2017-04-02 NOTE — Telephone Encounter (Signed)
Copied from [place] [id number]. Topic: Quick Communication - See Telephone Encounter >> Apr 02, 2017  9:32 AM Bea Graff, NT wrote: CRM for notification. See Telephone encounter for: Pt would like to speak to Dr. Birdie Riddle or her nurse. She was unable to have surgery today due to [place] called her Friday and stated they could not do the surgery today. Since it was the weekend pt went to the pharmacy and spoke to the pharmacist and told them her coumadin was low and the pt felt like she should start taking it again but doesn't know what the doctor would like for her to do. Please advise  04/02/17.

## 2017-04-02 NOTE — Telephone Encounter (Signed)
Called and LMOVM for Andrea Simmons to inform that per PCP pt was ok to have surgery as long as it was done today.   Copied from [place] [phone]. Topic: Inquiry >> Mar 30, 2017  4:42 PM Oliver Pila B wrote: Reason for CRM: Andrea Simmons from [facility] called to state that she got the message about stopping the coumadin/cancel the procedure  have Meriam Sprague contact Andrea Simmons [phone]

## 2017-04-02 NOTE — Telephone Encounter (Signed)
Please call Andrea Simmons and find out why procedure was cancelled.  Pt has been off her anticoagulation for 2 weeks!  If she is having this done it needs to happen ASAP!  If it is not going to happen, she needs to resume her Coumadin daily AND start the Lovenox.

## 2017-04-02 NOTE — Telephone Encounter (Signed)
Can you find out if pt would still like surgery?

## 2017-04-02 NOTE — Telephone Encounter (Signed)
Spoke with patient regarding eye surgery. Patient states she was instructed by [name], RN to continue Coumadin 5 mg daily. Patient took Coumadin 5 mg on Saturday (03/31/17) and Sunday (04/01/17), has not started Lovenox. Patient states [facility] has rescheduled her surgery to 04/18/17 d/t need for insurance prior authorization. Per Dr. Birdie Riddle, instructed patient to start Lovenox injections today through Wednesday (04/04/17) and continue Coumadin 5 mg daily, then as directed by [name], RN. Patient advised to stop Coumadin 5 days prior to surgery, with last dose being 04/12/17. Patient verbalized understanding.

## 2017-04-04 ENCOUNTER — Ambulatory Visit: Payer: Medicare Other | Admitting: General Practice

## 2017-04-04 DIAGNOSIS — Z7901 Long term (current) use of anticoagulants: Secondary | ICD-10-CM

## 2017-04-04 LAB — POCT INR: INR: 1.7

## 2017-04-04 NOTE — Telephone Encounter (Signed)
-----   Message from [name], MD sent at 04/04/2017  1:07 PM EST ----- Regarding: RE: Lovenox bridge Absolutely!  Thank you! KT ----- Message ----- From: Warden Fillers, RN Sent: 04/04/2017   1:01 PM To: [name], MD Subject: Lovenox bridge                                 Dr. Birdie Riddle,  Patient has eye procedure scheduled for 1/30 and will need to stop coumadin for 5 days prior.  Patient will need a Lovenox bridge.  Do I have your permission to dose and call in Lovenox?  [name], RN

## 2017-04-04 NOTE — Patient Instructions (Addendum)
Pre visit review using our clinic review tool, if applicable. No additional management support is needed unless otherwise documented below in the visit note.  Take 1 1/2 tablets today and then continue to take 1/2 pill (2.5mg ) daily EXCEPT for 1 pill (5mg ) on Wednesdays and Saturdays. Re-check in 4 weeks.  PLEASE FOLLOW PT INSTRUCTIONS.  1/24 - Last dose of coumadin until after procedure 1/25 - Nothing 1/26 - Lovenox in the AM (Try to take this by 7 am) 1/27 - Lovenox in the AM 1/28 - Lovenox in the AM 1/29 - Lovenox in the AM 1/30 - Procedure (Do not take Lovenox today) 1/31 - Lovenox AND 1 1/2 tablets of coumadin 2/1 - Lovenox AND 1 1/2 tablets of coumadin 2/2 - Lovenox AND 1 tablet of coumadin 2/3 - Lovenox AND 1 tablet of coumadin 2/4 - check INR in clinic with [facility]

## 2017-04-04 NOTE — Progress Notes (Signed)
I have reviewed and agree with this plan  

## 2017-04-05 ENCOUNTER — Other Ambulatory Visit: Payer: Self-pay | Admitting: General Practice

## 2017-04-05 MED ORDER — ENOXAPARIN SODIUM 100 MG/ML ~~LOC~~ SOLN: 100.0000 mg | SUBCUTANEOUS | 0 refills | Status: DC

## 2017-04-23 ENCOUNTER — Ambulatory Visit: Payer: Medicare Other | Admitting: General Practice

## 2017-04-23 DIAGNOSIS — I2699 Other pulmonary embolism without acute cor pulmonale: Secondary | ICD-10-CM

## 2017-04-23 DIAGNOSIS — Z7901 Long term (current) use of anticoagulants: Secondary | ICD-10-CM

## 2017-04-23 LAB — POCT INR: INR: 2

## 2017-04-23 NOTE — Patient Instructions (Addendum)
Pre visit review using our clinic review tool, if applicable. No additional management support is needed unless otherwise documented below in the visit note.  Continue to take 1/2 pill (2.5mg ) daily EXCEPT for 1 pill (5mg ) on Wednesdays and Saturdays. Re-check in 4 weeks.

## 2017-05-10 ENCOUNTER — Other Ambulatory Visit: Payer: Self-pay | Admitting: General Practice

## 2017-05-10 ENCOUNTER — Ambulatory Visit: Payer: Medicare Other

## 2017-05-10 MED ORDER — WARFARIN SODIUM 5 MG PO TABS: ORAL_TABLET | ORAL | 1 refills | Status: DC

## 2017-05-15 ENCOUNTER — Other Ambulatory Visit: Payer: Self-pay | Admitting: Family Medicine

## 2017-05-15 NOTE — Telephone Encounter (Signed)
Yes- pt is to be taking

## 2017-05-15 NOTE — Telephone Encounter (Signed)
Please advise, should pt be on this? It shows interaction with multiple medications.

## 2017-05-17 ENCOUNTER — Other Ambulatory Visit: Payer: Self-pay | Admitting: General Practice

## 2017-05-17 MED ORDER — OMEPRAZOLE 20 MG PO CPDR: DELAYED_RELEASE_CAPSULE | ORAL | 1 refills | Status: DC

## 2017-05-21 ENCOUNTER — Ambulatory Visit: Payer: Medicare Other

## 2017-05-23 ENCOUNTER — Ambulatory Visit: Payer: Medicare Other | Admitting: General Practice

## 2017-05-23 DIAGNOSIS — Z7901 Long term (current) use of anticoagulants: Secondary | ICD-10-CM | POA: Diagnosis not present

## 2017-05-23 DIAGNOSIS — I2699 Other pulmonary embolism without acute cor pulmonale: Secondary | ICD-10-CM | POA: Diagnosis not present

## 2017-05-23 LAB — POCT INR: INR: 1.9

## 2017-05-23 NOTE — Patient Instructions (Addendum)
Pre visit review using our clinic review tool, if applicable. No additional management support is needed unless otherwise documented below in the visit note.  Take 1 1/2 tablets today and then continue to take 1/2 pill (2.5mg ) daily EXCEPT for 1 pill (5mg ) on Wednesdays and Saturdays. Re-check in 4 weeks.

## 2017-05-23 NOTE — Progress Notes (Signed)
I have reviewed and agree with this plan  

## 2017-06-15 LAB — HM DIABETES EYE EXAM

## 2017-06-20 ENCOUNTER — Ambulatory Visit: Payer: Medicare Other | Admitting: General Practice

## 2017-06-20 DIAGNOSIS — I2699 Other pulmonary embolism without acute cor pulmonale: Secondary | ICD-10-CM

## 2017-06-20 DIAGNOSIS — Z7901 Long term (current) use of anticoagulants: Secondary | ICD-10-CM

## 2017-06-20 LAB — POCT INR: INR: 2.2

## 2017-06-20 NOTE — Patient Instructions (Addendum)
Pre visit review using our clinic review tool, if applicable. No additional management support is needed unless otherwise documented below in the visit note.  Continue to take 1/2 pill (2.5mg ) daily EXCEPT for 1 pill (5mg ) on Wednesdays and Saturdays. Re-check in 4 weeks.

## 2017-07-03 ENCOUNTER — Encounter: Payer: Self-pay | Admitting: General Practice

## 2017-07-03 ENCOUNTER — Other Ambulatory Visit: Payer: Self-pay | Admitting: Family Medicine

## 2017-07-16 ENCOUNTER — Ambulatory Visit: Payer: Medicare Other | Admitting: General Practice

## 2017-07-16 DIAGNOSIS — I2699 Other pulmonary embolism without acute cor pulmonale: Secondary | ICD-10-CM

## 2017-07-16 DIAGNOSIS — Z7901 Long term (current) use of anticoagulants: Secondary | ICD-10-CM | POA: Diagnosis not present

## 2017-07-16 LAB — POCT INR: INR: 2.9

## 2017-07-16 NOTE — Patient Instructions (Addendum)
Pre visit review using our clinic review tool, if applicable. No additional management support is needed unless otherwise documented below in the visit note.  Continue to take 1/2 pill (2.5mg ) daily EXCEPT for 1 pill (5mg ) on Wednesdays and Saturdays. Re-check in 4 weeks.

## 2017-08-15 ENCOUNTER — Other Ambulatory Visit: Payer: Self-pay | Admitting: Family Medicine

## 2017-08-15 DIAGNOSIS — Z1231 Encounter for screening mammogram for malignant neoplasm of breast: Secondary | ICD-10-CM

## 2017-08-20 ENCOUNTER — Ambulatory Visit: Payer: Medicare Other | Admitting: General Practice

## 2017-08-20 DIAGNOSIS — I2699 Other pulmonary embolism without acute cor pulmonale: Secondary | ICD-10-CM

## 2017-08-20 DIAGNOSIS — Z7901 Long term (current) use of anticoagulants: Secondary | ICD-10-CM

## 2017-08-20 LAB — POCT INR: INR: 1.8 — AB (ref 2.0–3.0)

## 2017-08-20 NOTE — Patient Instructions (Addendum)
Pre visit review using our clinic review tool, if applicable. No additional management support is needed unless otherwise documented below in the visit note.  Take 1 tablet today (6/3) and then continue to take 1/2 pill (2.5mg ) daily EXCEPT for 1 pill (5mg ) on Wednesdays and Saturdays. Re-check in 4 weeks.

## 2017-09-04 ENCOUNTER — Other Ambulatory Visit: Payer: Self-pay | Admitting: Family Medicine

## 2017-09-04 ENCOUNTER — Ambulatory Visit
Admission: RE | Admit: 2017-09-04 | Discharge: 2017-09-04 | Disposition: A | Discharge status: Home / Self Care | Payer: Medicare Other | Source: Ambulatory Visit | Attending: Family Medicine | Admitting: Family Medicine

## 2017-09-04 DIAGNOSIS — Z1231 Encounter for screening mammogram for malignant neoplasm of breast: Secondary | ICD-10-CM

## 2017-09-17 ENCOUNTER — Ambulatory Visit: Payer: Medicare Other | Admitting: General Practice

## 2017-09-17 DIAGNOSIS — Z7901 Long term (current) use of anticoagulants: Secondary | ICD-10-CM

## 2017-09-17 DIAGNOSIS — I2699 Other pulmonary embolism without acute cor pulmonale: Secondary | ICD-10-CM

## 2017-09-17 LAB — POCT INR: INR: 1.6 — AB (ref 2.0–3.0)

## 2017-09-17 NOTE — Patient Instructions (Addendum)
Pre visit review using our clinic review tool, if applicable. No additional management support is needed unless otherwise documented below in the visit note.  Take 1 tablet today and tomorrow (7/1 and 7/2) and then take 1/2 pill (2.5mg ) daily EXCEPT for 1 pill (5mg ) on Monday/Wednesdays andFridays. Re-check in 2 weeks.

## 2017-10-03 ENCOUNTER — Ambulatory Visit: Payer: Medicare Other | Admitting: General Practice

## 2017-10-03 ENCOUNTER — Other Ambulatory Visit: Payer: Self-pay

## 2017-10-03 ENCOUNTER — Ambulatory Visit: Payer: Medicare Other | Admitting: Family Medicine

## 2017-10-03 ENCOUNTER — Encounter: Payer: Self-pay | Admitting: Family Medicine

## 2017-10-03 VITALS — BP 121/83 | HR 76 | Temp 98.1°F | Resp 16 | Ht 64.0 in | Wt 136.4 lb

## 2017-10-03 DIAGNOSIS — I2699 Other pulmonary embolism without acute cor pulmonale: Secondary | ICD-10-CM

## 2017-10-03 DIAGNOSIS — I10 Essential (primary) hypertension: Secondary | ICD-10-CM

## 2017-10-03 DIAGNOSIS — E039 Hypothyroidism, unspecified: Secondary | ICD-10-CM | POA: Diagnosis not present

## 2017-10-03 DIAGNOSIS — E119 Type 2 diabetes mellitus without complications: Secondary | ICD-10-CM | POA: Diagnosis not present

## 2017-10-03 DIAGNOSIS — E785 Hyperlipidemia, unspecified: Secondary | ICD-10-CM | POA: Diagnosis not present

## 2017-10-03 DIAGNOSIS — Z7901 Long term (current) use of anticoagulants: Secondary | ICD-10-CM | POA: Diagnosis not present

## 2017-10-03 LAB — BASIC METABOLIC PANEL
BUN: 18 mg/dL (ref 6–23)
CALCIUM: 9.6 mg/dL (ref 8.4–10.5)
CO2: 27 mEq/L (ref 19–32)
CREATININE: 0.94 mg/dL (ref 0.40–1.20)
Chloride: 105 mEq/L (ref 96–112)
GFR: 61.62 mL/min (ref >60.00)
Glucose, Bld: 95 mg/dL (ref 70–99)
Potassium: 4.2 mEq/L (ref 3.5–5.1)
Sodium: 140 mEq/L (ref 135–145)

## 2017-10-03 LAB — CBC WITH DIFFERENTIAL/PLATELET
BASOS ABS: 0 10*3/uL (ref 0.0–0.1)
Basophils Relative: 0.6 % (ref 0.0–3.0)
EOS PCT: 1.1 % (ref 0.0–5.0)
Eosinophils Absolute: 0 10*3/uL (ref 0.0–0.7)
HEMATOCRIT: 33.5 % — AB (ref 36.0–46.0)
Hemoglobin: 11.2 g/dL — ABNORMAL LOW (ref 12.0–15.0)
LYMPHS PCT: 36 % (ref 12.0–46.0)
Lymphs Abs: 1.6 10*3/uL (ref 0.7–4.0)
MCHC: 33.4 g/dL (ref 30.0–36.0)
MCV: 89.6 fl (ref 78.0–100.0)
MONOS PCT: 8.1 % (ref 3.0–12.0)
Monocytes Absolute: 0.4 10*3/uL (ref 0.1–1.0)
NEUTROS PCT: 54.2 % (ref 43.0–77.0)
Neutro Abs: 2.5 10*3/uL (ref 1.4–7.7)
Platelets: 197 10*3/uL (ref 150.0–400.0)
RBC: 3.74 Mil/uL — ABNORMAL LOW (ref 3.87–5.11)
RDW: 15.2 % (ref 11.5–15.5)
WBC: 4.6 10*3/uL (ref 4.0–10.5)

## 2017-10-03 LAB — HEPATIC FUNCTION PANEL
ALT: 21 U/L (ref 0–35)
AST: 39 U/L — ABNORMAL HIGH (ref 0–37)
Albumin: 4.4 g/dL (ref 3.5–5.2)
Alkaline Phosphatase: 32 U/L — ABNORMAL LOW (ref 39–117)
BILIRUBIN DIRECT: 0.1 mg/dL (ref 0.0–0.3)
BILIRUBIN TOTAL: 0.6 mg/dL (ref 0.2–1.2)
Total Protein: 6.8 g/dL (ref 6.0–8.3)

## 2017-10-03 LAB — LIPID PANEL
CHOL/HDL RATIO: 4
Cholesterol: 127 mg/dL (ref 0–200)
HDL: 34 mg/dL — ABNORMAL LOW (ref >39.00)
LDL CALC: 67 mg/dL (ref 0–99)
NONHDL: 93.25
TRIGLYCERIDES: 130 mg/dL (ref 0.0–149.0)
VLDL: 26 mg/dL (ref 0.0–40.0)

## 2017-10-03 LAB — HEMOGLOBIN A1C: Hgb A1c MFr Bld: 6.3 % (ref 4.6–6.5)

## 2017-10-03 LAB — TSH: TSH: 1.39 u[IU]/mL (ref 0.35–4.50)

## 2017-10-03 LAB — POCT INR: INR: 1.3 — AB (ref 2.0–3.0)

## 2017-10-03 NOTE — Assessment & Plan Note (Signed)
Continues Coumadin daily.  Following w/ Coumadin clinic

## 2017-10-03 NOTE — Assessment & Plan Note (Signed)
Chronic problem.  Tolerating Metformin.  UTD on eye exam, on ARB for renal protection.  Foot exam done today.  Check labs.  Adjust meds prn

## 2017-10-03 NOTE — Assessment & Plan Note (Signed)
Chronic problem.  Well controlled.  Asymptomatic.  Check labs.  No anticipated med changes. 

## 2017-10-03 NOTE — Patient Instructions (Signed)
Schedule your complete physical in 6 months We'll notify you of your lab results and make any changes if needed Continue to work on healthy diet and regular exercise- you look great! Call with any questions or concerns Have a great summer!!

## 2017-10-03 NOTE — Patient Instructions (Signed)
Pre visit review using our clinic review tool, if applicable. No additional management support is needed unless otherwise documented below in the visit note.  Take 1 1/2 tablets today (7/17) and take 1 tablet tomorrow (7/18) and then take 1 1/2 tablets on Friday (7/19).  On Saturday continue to take 1/2 tablet daily except 1 tablet on Monday/Wed/Fridays.  Re-check in 1 week.  Decrease dark greens.

## 2017-10-03 NOTE — Progress Notes (Signed)
   Subjective:    Patient ID: Andrea Simmons, female    DOB: [birth year], 77 y.o.   MRN: [id number]  HPI HTN- chronic problem, on Valsartan HCTZ daily w/ good control.  No CP, SOB, HAs, visual changes, edema.  Hyperlipidemia- chronic problem, on Crestor 20mg  daily.  Pt is down 14 lbs from last visit.  Pt is working in yard regularly.  No abd pain, N/V.  DM- chronic problem, on Metformin 1000mg  BID.  UTD on eye exam.  On ARB for renal protection.  UTD on foot exam.  CBG was 112 this AM. Denies symptomatic lows.  No numbness/tingling of hands/feet  Hypothyroid- chronic problem, on Levothyroxine 82mcg daily.  Denies excessive fatigue or changes to skin/hair/nails  Review of Systems For ROS see HPI     Objective:   Physical Exam  Constitutional: She is oriented to person, place, and time. She appears well-developed and well-nourished. No distress.  HENT:  Head: Normocephalic and atraumatic.  Eyes: Pupils are equal, round, and reactive to light. Conjunctivae and EOM are normal.  Neck: Normal range of motion. Neck supple. No thyromegaly present.  Cardiovascular: Normal rate, regular rhythm, normal heart sounds and intact distal pulses.  No murmur heard. Pulmonary/Chest: Effort normal and breath sounds normal. No respiratory distress.  Abdominal: Soft. She exhibits no distension. There is no tenderness.  Musculoskeletal: She exhibits no edema.  Lymphadenopathy:    She has no cervical adenopathy.  Neurological: She is alert and oriented to person, place, and time.  Skin: Skin is warm and dry.  Psychiatric: She has a normal mood and affect. Her behavior is normal.  Vitals reviewed.         Assessment & Plan:

## 2017-10-03 NOTE — Assessment & Plan Note (Signed)
Chronic problem.  Currently asymptomatic.  Check labs.  Adjust meds prn  

## 2017-10-03 NOTE — Assessment & Plan Note (Signed)
Chronic problem.  Tolerating statin w/o difficulty.  Check labs.  Adjust meds prn  

## 2017-10-04 ENCOUNTER — Encounter: Payer: Self-pay | Admitting: Emergency Medicine

## 2017-10-10 ENCOUNTER — Ambulatory Visit: Payer: Medicare Other | Admitting: General Practice

## 2017-10-10 ENCOUNTER — Other Ambulatory Visit: Payer: Self-pay | Admitting: Family Medicine

## 2017-10-10 DIAGNOSIS — Z7901 Long term (current) use of anticoagulants: Secondary | ICD-10-CM

## 2017-10-10 DIAGNOSIS — I2699 Other pulmonary embolism without acute cor pulmonale: Secondary | ICD-10-CM

## 2017-10-10 LAB — POCT INR: INR: 1.6 — AB (ref 2.0–3.0)

## 2017-10-10 NOTE — Patient Instructions (Addendum)
Pre visit review using our clinic review tool, if applicable. No additional management support is needed unless otherwise documented below in the visit note.  Take 1 1/2 tablets today (7/24) and take 1 tablet tomorrow (7/25) and then 1 tablet on Monday/Wed/Fri and Sat and take 1/2 tablet on Sunday/Tues/Thursday.  Re-check in 2 weeks.

## 2017-10-24 ENCOUNTER — Ambulatory Visit: Payer: Medicare Other | Admitting: General Practice

## 2017-10-24 DIAGNOSIS — Z7901 Long term (current) use of anticoagulants: Secondary | ICD-10-CM | POA: Diagnosis not present

## 2017-10-24 DIAGNOSIS — I2699 Other pulmonary embolism without acute cor pulmonale: Secondary | ICD-10-CM

## 2017-10-24 LAB — POCT INR: INR: 1.5 — AB (ref 2.0–3.0)

## 2017-10-24 NOTE — Patient Instructions (Addendum)
Pre visit review using our clinic review tool, if applicable. No additional management support is needed unless otherwise documented below in the visit note.  Take 1 tablet today (8/7) and take 1 1/2 tablets tomorrow (8/8) and then start taking 1 tablet daily except 1/2 tablet on Wednesdays.  Re-check in 2 weeks.

## 2017-11-07 ENCOUNTER — Ambulatory Visit: Payer: Medicare Other | Admitting: General Practice

## 2017-11-07 DIAGNOSIS — Z7901 Long term (current) use of anticoagulants: Secondary | ICD-10-CM | POA: Diagnosis not present

## 2017-11-07 DIAGNOSIS — I2699 Other pulmonary embolism without acute cor pulmonale: Secondary | ICD-10-CM

## 2017-11-07 LAB — POCT INR: INR: 2.5 (ref 2.0–3.0)

## 2017-11-07 NOTE — Patient Instructions (Addendum)
Pre visit review using our clinic review tool, if applicable. No additional management support is needed unless otherwise documented below in the visit note.  Continue taking 1 tablet daily except 1/2 tablet on Wednesdays.  Re-check in 4 weeks.

## 2017-11-07 NOTE — Progress Notes (Signed)
I have reviewed and agree with this plan  

## 2017-11-20 ENCOUNTER — Ambulatory Visit (INDEPENDENT_AMBULATORY_CARE_PROVIDER_SITE_OTHER)
Admission: RE | Admit: 2017-11-20 | Discharge: 2017-11-20 | Disposition: A | Discharge status: Home / Self Care | Payer: Medicare Other | Source: Ambulatory Visit | Attending: Internal Medicine | Admitting: Internal Medicine

## 2017-11-20 ENCOUNTER — Ambulatory Visit: Payer: Medicare Other | Admitting: Internal Medicine

## 2017-11-20 ENCOUNTER — Encounter: Payer: Self-pay | Admitting: Internal Medicine

## 2017-11-20 ENCOUNTER — Other Ambulatory Visit (INDEPENDENT_AMBULATORY_CARE_PROVIDER_SITE_OTHER): Payer: Medicare Other

## 2017-11-20 VITALS — BP 132/74 | HR 78 | Ht 66.5 in | Wt 127.4 lb

## 2017-11-20 DIAGNOSIS — R0609 Other forms of dyspnea: Secondary | ICD-10-CM

## 2017-11-20 LAB — CBC WITH DIFFERENTIAL/PLATELET
Basophils Absolute: 0 10*3/uL (ref 0.0–0.1)
Basophils Relative: 0.5 % (ref 0.0–3.0)
Eosinophils Absolute: 0.1 10*3/uL (ref 0.0–0.7)
Eosinophils Relative: 1.1 % (ref 0.0–5.0)
HCT: 33.3 % — ABNORMAL LOW (ref 36.0–46.0)
Hemoglobin: 11.2 g/dL — ABNORMAL LOW (ref 12.0–15.0)
LYMPHS PCT: 32.5 % (ref 12.0–46.0)
Lymphs Abs: 1.7 10*3/uL (ref 0.7–4.0)
MCHC: 33.5 g/dL (ref 30.0–36.0)
MCV: 89.6 fl (ref 78.0–100.0)
MONO ABS: 0.5 10*3/uL (ref 0.1–1.0)
Monocytes Relative: 10.7 % (ref 3.0–12.0)
Neutro Abs: 2.8 10*3/uL (ref 1.4–7.7)
Neutrophils Relative %: 55.2 % (ref 43.0–77.0)
Platelets: 201 10*3/uL (ref 150.0–400.0)
RBC: 3.72 Mil/uL — ABNORMAL LOW (ref 3.87–5.11)
RDW: 15.1 % (ref 11.5–15.5)
WBC: 5.1 10*3/uL (ref 4.0–10.5)

## 2017-11-20 LAB — IBC PANEL
IRON: 104 ug/dL (ref 42–145)
SATURATION RATIOS: 19.5 % — AB (ref 20.0–50.0)
TRANSFERRIN: 381 mg/dL — AB (ref 212.0–360.0)

## 2017-11-20 LAB — BASIC METABOLIC PANEL
BUN: 23 mg/dL (ref 6–23)
CO2: 25 meq/L (ref 19–32)
CREATININE: 1 mg/dL (ref 0.40–1.20)
Calcium: 9.6 mg/dL (ref 8.4–10.5)
Chloride: 103 mEq/L (ref 96–112)
GFR: 57.36 mL/min — ABNORMAL LOW (ref >60.00)
Glucose, Bld: 99 mg/dL (ref 70–99)
Potassium: 4.2 mEq/L (ref 3.5–5.1)
Sodium: 137 mEq/L (ref 135–145)

## 2017-11-20 LAB — BRAIN NATRIURETIC PEPTIDE: PRO B NATRI PEPTIDE: 52 pg/mL (ref 0.0–100.0)

## 2017-11-20 NOTE — Patient Instructions (Addendum)
Change prilosec to where you Take 30- 60 min before your first and last meals of the day    Please remember to go to the lab and x-ray department downstairs in the basement  for your tests - we will call you with the results when they are available and set up appropriate follow up   Add: order echo to r/o [facility]

## 2017-11-20 NOTE — Progress Notes (Signed)
Andrea Simmons, female    DOB: 1940/11/24,   MRN: [id number]   Brief patient profile:  89 yowf never smoker s/p PE in [place] around 2008 p car trip does not recall leg symptoms and then second PE  after coumadin held for GI procedure 6 months after the first one with neg L leg doppler in 09/01/13 but did not look at R and self referred to pulmonary clinic 11/20/2017 for eval of doe / fatigue.       11/20/2017 1st pulmonary eval/ [facility]  Chief Complaint  Patient presents with  . Fatigue    For last four months, but getting worse.  doe x  up and down steps basement to 1st floor started around 07/2017 and better on Fe rx but still sob if does it multiple times  Sleeps poorly due to insomnia/ R side down / bed flat, one small pillow  No cough  Takes prilosec hs    No obvious day to day or daytime variability or assoc excess/ purulent sputum or mucus plugs or hemoptysis or cp or chest tightness, subjective wheeze or overt sinus or hb symptoms.   Sleeps as above  without nocturnal  or early am exacerbation  of respiratory  c/o's or need for noct saba. Also denies any obvious fluctuation of symptoms with weather or environmental changes or other aggravating or alleviating factors except as outlined above   No unusual exposure hx or h/o childhood pna/ asthma or knowledge of premature birth.  Current Allergies, Complete Past Medical History, Past Surgical History, Family History, and Social History were reviewed in [employer] record.  ROS  The following are not active complaints unless bolded Hoarseness, sore throat, dysphagia, dental problems, itching, sneezing,  nasal congestion or discharge of excess mucus or purulent secretions, ear ache,   fever, chills, sweats, unintended wt loss or wt gain, classically pleuritic or exertional cp,  orthopnea pnd or arm/hand swelling  or leg swelling, presyncope, palpitations, abdominal pain, anorexia, nausea, vomiting, diarrhea   or change in bowel habits or change in bladder habits, change in stools or change in urine, dysuria, hematuria,  rash, arthralgias, visual complaints, headache, numbness, weakness or ataxia or problems with walking or coordination,  change in mood or  memory.             Past Medical History:  Diagnosis Date  . Anemia   . Anxiety   . Breast cancer ([place])   . Depression   . Diabetes mellitus type II   . Fibromyalgia   . GERD (gastroesophageal reflux disease)   . Hyperlipidemia   . Hypertension   . Malignant neoplasm of breast (female), unspecified site 1993   L breast s/p mastectomy and tamoxifen x 88yrs  . 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 Unspecified hypothyroidism     Outpatient Medications Prior to Visit  Medication Sig Dispense Refill  . aspirin 81 MG tablet Take 81 mg by mouth daily.      Marland Kitchen BIOTIN PO Take by mouth.    Marland Kitchen buPROPion (ZYBAN) 150 MG 12 hr tablet Take 150 mg by mouth every morning. Patient taking two tablets in AM for 4 weeks, then take 3 tablets AM for another 4 weeks, until seen by primary doctor.  0  . Cholecalciferol (VITAMIN D3) 3000 units TABS Take by mouth.    . Choline Fenofibrate 135 MG capsule Take 135 mg by mouth daily.      Marland Kitchen  DULoxetine (CYMBALTA) 60 MG capsule Take 60 mg by mouth 2 (two) times daily.  3  . Ferrous Sulfate (IRON) 325 (65 Fe) MG TABS Take by mouth.    Marland Kitchen HORIZANT 600 MG TBCR TAKE 1 TABLET EVERY NIGHT WITH DINNER  3  . levothyroxine (SYNTHROID, LEVOTHROID) 50 MCG tablet Take 50 mcg by mouth daily before breakfast.    . Magnesium 500 MG TABS Take by mouth.    . metFORMIN (GLUCOPHAGE-XR) 500 MG 24 hr tablet Take 1,000 mg by mouth 2 (two) times daily.  3  . Multiple Vitamin (MULTIVITAMIN) tablet Take 1 tablet by mouth daily.      . Omega-3 Fatty Acids (FISH OIL) 500 MG CAPS Take 1 capsule by mouth 2 (two) times daily.     Marland Kitchen omeprazole (PRILOSEC) 20 MG capsule TAKE 1 CAPSULE BY MOUTH EVERY  DAY 90 capsule 1  . ONE TOUCH ULTRA TEST test strip CHECK BLOOD SUGAR TWICE DAILY 100 each 3  . ONETOUCH DELICA LANCETS 50D MISC USE AS DIRECTED TWICE DAILY 200 each 3  . RESTASIS MULTIDOSE 0.05 % ophthalmic emulsion INSTILL 1 DROP BY OPHTHALMIC ROUTE EVERY 12 HOURS INTO AFFECTED EYE(S)  6  . rosuvastatin (CRESTOR) 20 MG tablet Take 10 mg by mouth daily.     . valsartan-hydrochlorothiazide (DIOVAN-HCT) 320-25 MG tablet TAKE 1 TABLET BY MOUTH EVERY DAY 90 tablet 2  . warfarin (COUMADIN) 5 MG tablet TAKE AS DIRECTED BY COUMADIN CLINIC. (Patient taking differently: TAKE AS DIRECTED BY COUMADIN CLINIC. 5 mg daily, but taking 2.5 mg on Wednesday only.) 90 tablet 1  . calcium carbonate (OS-CAL) 600 MG TABS Take 1,200 mg by mouth daily.      Marland Kitchen FIBER ADULT GUMMIES 2 g CHEW Chew by mouth.    Marland Kitchen tiZANidine (ZANAFLEX) 2 MG tablet Take 2 mg by mouth 2 (two) times daily as needed.  2  . triamcinolone cream (KENALOG) 0.5 % APPLY 1 APPLICATION TWICE A DAY  2  . warfarin (COUMADIN) 5 MG tablet TAKE AS DIRECTED BY COUMADIN CLINIC. (Patient not taking: Reported on 11/20/2017) 90 tablet 1   No facility-administered medications prior to visit.            Objective:     BP 132/74 (BP Location: Left Arm, Patient Position: Sitting, Cuff Size: Normal)   Pulse 78   Ht 5' 6.5" (1.689 m)   Wt 127 lb 6.4 oz (57.8 kg)   SpO2 96%   BMI 20.25 kg/m   SpO2: 96 % RA  amb somber pale wf nad   HEENT: nl dentition, turbinates bilaterally, and oropharynx. Nl external ear canals without cough reflex   NECK :  without JVD/Nodes/TM/ nl carotid upstrokes bilaterally   LUNGS: no acc muscle use,  Nl contour chest which is clear to A and P bilaterally without cough on insp or exp maneuvers   CV:  RRR  no s3 or murmur or increase in P2, and no edema   ABD:  soft and nontender with nl inspiratory excursion in the supine position. No bruits or organomegaly appreciated, bowel sounds nl  MS:  Nl gait/ ext warm without  deformities, calf tenderness, cyanosis or clubbing No obvious joint restrictions   SKIN: warm and dry without lesions    NEURO:  alert, approp, nl sensorium with  no motor or cerebellar deficits apparent.     CXR PA and Lateral:   11/20/2017 :    I personally reviewed images and  impression as follows:  Mild T kyphosis, min CM, no acute findings   Labs ordered/ reviewed:      Chemistry      Component Value Date/Time   NA 137 11/20/2017 1553   NA 141 08/01/2016 1527   K 4.2 11/20/2017 1553   K 4.6 08/01/2016 1527   CL 103 11/20/2017 1553   CO2 25 11/20/2017 1553   CO2 24 08/01/2016 1527   BUN 23 11/20/2017 1553   BUN 19.9 08/01/2016 1527   CREATININE 1.00 11/20/2017 1553   CREATININE 0.9 08/01/2016 1527      Component Value Date/Time   CALCIUM 9.6 11/20/2017 1553   CALCIUM 9.7 08/01/2016 1527   ALKPHOS 32 (L) 10/03/2017 1122   ALKPHOS 48 08/01/2016 1527   AST 39 (H) 10/03/2017 1122   AST 41 (H) 08/01/2016 1527   ALT 21 10/03/2017 1122   ALT 25 08/01/2016 1527   BILITOT 0.6 10/03/2017 1122   BILITOT 0.33 08/01/2016 1527        Lab Results  Component Value Date   WBC 5.1 11/20/2017   HGB 11.2 (L) 11/20/2017   HCT 33.3 (L) 11/20/2017   MCV 89.6 11/20/2017   PLT 201.0 11/20/2017       EOS                                                               0.1                                    11/20/2017       Lab Results  Component Value Date   TSH 1.39 10/03/2017     Lab Results  Component Value Date   PROBNP 52.0 11/20/2017      Fe/TiBC  =  19% 11/20/2017           Assessment   No problem-specific Assessment & Plan notes found for this encounter.     Christinia Gully, MD 11/20/2017

## 2017-11-21 ENCOUNTER — Encounter: Payer: Self-pay | Admitting: Internal Medicine

## 2017-11-21 ENCOUNTER — Telehealth: Payer: Self-pay | Admitting: Internal Medicine

## 2017-11-21 DIAGNOSIS — R0609 Other forms of dyspnea: Secondary | ICD-10-CM

## 2017-11-21 NOTE — Progress Notes (Signed)
LMTCB

## 2017-11-21 NOTE — Assessment & Plan Note (Signed)
11/20/2017  Walked RA x 3 laps @ 185 ft each stopped due to  End of study, fast pace, no desat, min sob   Symptoms are   disproportionate to objective findings and not clear to what extent this is actually a pulmonary  problem but pt does appear to have difficult to sort out respiratory symptoms of unknown origin for which  DDX  = almost all start with A and  include Adherence, Ace Inhibitors, Acid Reflux, Active Sinus Disease, Alpha 1 Antitripsin deficiency, Anxiety masquerading as Airways dz,  ABPA,  Allergy(esp in young), Aspiration (esp in elderly), Adverse effects of meds,  Active smokers, A bunch of PE's/clot burden (a few small clots can't cause this syndrome unless there is already severe underlying pulm or vascular dz with poor reserve),  Anemia or thyroid disorder, plus two Bs  = Bronchiectasis and Beta blocker use..and one C= CHF   Adherence is always the initial "prime suspect" and is a multilayered concern that requires a "trust but verify" approach in every patient - starting with knowing how to use medications, especially inhalers, correctly, keeping up with refills and understanding the fundamental difference between maintenance and prns vs those medications only taken for a very short course and then stopped and not refilled.   ? Acid (or non-acid) GERD > always difficult to exclude as up to 75% of pts in some series report no assoc GI/ Heartburn symptoms> rec continue max (24h)  acid suppression and diet restrictions/ reviewed     ? Allergy / asthma > very unlikely s noct symptoms, assoc rhinitis or variability of resp complaints  ? Anxiety/depression  > usually at the bottom of this list of usual suspects but should be much higher on this pt's based on H and P and note already on psychotropics and may interfere with adherence and also interpretation of response or lack thereof to symptom management which can be quite subjective.   ? Adverse effects of meds > none of the usual suspects  listed  ? Anemia > very min and no longer Fe def by today's labs - she is pale but this is likely due to avoiding UV  ? Thyroid dz > TSH nl  On supplements  ? A bunch of PE's > unlikely on coumadin but doesn't r/u TEPAH > rec echo  ? chf > excluded by bnp so low but again could still have TEPAH > echo ordered      Total time devoted to counseling  > 50 % of initial 60 min office visit:  review case with pt/ discussion of options/alternatives/ personally creating written customized instructions  in presence of pt  then going over those specific  Instructions directly with the pt including how to use all of the meds but in particular covering each new medication in detail and the difference between the maintenance= "automatic" meds and the prns using an action plan format for the latter (If this problem/symptom => do that organization reading Left to right).  Please see AVS from this visit for a full list of these instructions which I personally wrote for this pt and  are unique to this visit.

## 2017-11-21 NOTE — Progress Notes (Signed)
What test are you asking for? Please advise, thanks!

## 2017-11-21 NOTE — Telephone Encounter (Signed)
yes

## 2017-11-21 NOTE — Telephone Encounter (Signed)
Spoke with patient, she recalls [facility] needing some additional testing. Per Magda Paganini via [facility] patient needs an echocardiogram. Patient made aware we would be ordering an echo and they would be calling her to get scheduled. Voiced understanding.   MW please advise if you want patient to have perflutren with echo so we can place order. Thanks.

## 2017-11-21 NOTE — Telephone Encounter (Signed)
Order has been placed.

## 2017-11-21 NOTE — Progress Notes (Signed)
Spoke with pt and notified of results per Dr. Wert. Pt verbalized understanding and denied any questions. 

## 2017-11-25 ENCOUNTER — Other Ambulatory Visit: Payer: Self-pay | Admitting: Internal Medicine

## 2017-11-26 ENCOUNTER — Ambulatory Visit (HOSPITAL_COMMUNITY): Payer: Medicare Other

## 2017-12-05 ENCOUNTER — Other Ambulatory Visit: Payer: Self-pay

## 2017-12-05 ENCOUNTER — Ambulatory Visit (HOSPITAL_COMMUNITY): Payer: Medicare Other | Attending: Internal Medicine

## 2017-12-05 ENCOUNTER — Ambulatory Visit: Payer: Medicare Other | Admitting: General Practice

## 2017-12-05 DIAGNOSIS — R0609 Other forms of dyspnea: Secondary | ICD-10-CM | POA: Diagnosis present

## 2017-12-05 DIAGNOSIS — Z86711 Personal history of pulmonary embolism: Secondary | ICD-10-CM | POA: Diagnosis not present

## 2017-12-05 DIAGNOSIS — I2699 Other pulmonary embolism without acute cor pulmonale: Secondary | ICD-10-CM

## 2017-12-05 DIAGNOSIS — I10 Essential (primary) hypertension: Secondary | ICD-10-CM | POA: Diagnosis not present

## 2017-12-05 DIAGNOSIS — E119 Type 2 diabetes mellitus without complications: Secondary | ICD-10-CM | POA: Insufficient documentation

## 2017-12-05 DIAGNOSIS — Z853 Personal history of malignant neoplasm of breast: Secondary | ICD-10-CM | POA: Insufficient documentation

## 2017-12-05 DIAGNOSIS — Z7901 Long term (current) use of anticoagulants: Secondary | ICD-10-CM | POA: Diagnosis not present

## 2017-12-05 LAB — POCT INR: INR: 3 (ref 2.0–3.0)

## 2017-12-05 NOTE — Progress Notes (Signed)
I have reviewed documentation by the health coach and agree with documentation. I was immediately available for questions.

## 2017-12-05 NOTE — Patient Instructions (Signed)
Pre visit review using our clinic review tool, if applicable. No additional management support is needed unless otherwise documented below in the visit note.  Hold dosage today and then continue taking 1 tablet daily except 1/2 tablet on Wednesdays.  Re-check in 4 weeks.

## 2017-12-06 ENCOUNTER — Telehealth: Payer: Self-pay | Admitting: Internal Medicine

## 2017-12-06 NOTE — Progress Notes (Signed)
LMTCB

## 2017-12-06 NOTE — Telephone Encounter (Signed)
Patient returned call, CB is [phone].

## 2017-12-06 NOTE — Telephone Encounter (Signed)
Spoke with patient, informed of Echo results. Voiced understanding. Nothing further needed at this time.

## 2018-01-01 ENCOUNTER — Ambulatory Visit: Payer: Medicare Other | Admitting: Psychiatry

## 2018-01-01 ENCOUNTER — Encounter: Payer: Self-pay | Admitting: Psychiatry

## 2018-01-01 DIAGNOSIS — G4721 Circadian rhythm sleep disorder, delayed sleep phase type: Secondary | ICD-10-CM

## 2018-01-01 DIAGNOSIS — F331 Major depressive disorder, recurrent, moderate: Secondary | ICD-10-CM

## 2018-01-01 DIAGNOSIS — F4001 Agoraphobia with panic disorder: Secondary | ICD-10-CM

## 2018-01-01 MED ORDER — ARIPIPRAZOLE 2 MG PO TABS: 2.0000 mg | ORAL_TABLET | Freq: Every day | ORAL | 1 refills | Status: DC

## 2018-01-01 NOTE — Progress Notes (Signed)
[facility]  Patient ID: Andrea Simmons,  MRN: [id number]  PCP: [name], MD  Date of Evaluation: 01/01/2018 Time spent:30 minutes   HISTORY/CURRENT STATUS: HPI CC; increasing bupropion made her too talkative and harder to go to sleep. Doesn't feel otherwise different.  Still tired without benefit.  Still irrritable.  Still depressed with lack of motivation.  Poor function.  Poor concentration and forgetful.    Individual Medical History/ Review of Systems: Changes? :Yes Tiredness.  Being worked up by endocrinology.  FM, CBP  Allergies: Anaprox [naproxen sodium]; Lipitor [atorvastatin]; Meperidine hcl; and Morphine  Current Medications:  Current Outpatient Medications:  .  aspirin 81 MG tablet, Take 81 mg by mouth daily.  , Disp: , Rfl:  .  BIOTIN PO, Take by mouth., Disp: , Rfl:  .  buPROPion (ZYBAN) 150 MG 12 hr tablet, Take 450 mg by mouth every morning. Patient taking two tablets in AM for 4 weeks, then take 3 tablets AM for another 4 weeks, until seen by primary doctor., Disp: , Rfl: 0 .  Cholecalciferol (VITAMIN D3) 3000 units TABS, Take by mouth., Disp: , Rfl:  .  DULoxetine (CYMBALTA) 60 MG capsule, Take 60 mg by mouth 2 (two) times daily., Disp: , Rfl: 3 .  Ferrous Sulfate (IRON) 325 (65 Fe) MG TABS, Take by mouth., Disp: , Rfl:  .  levothyroxine (SYNTHROID, LEVOTHROID) 50 MCG tablet, Take 50 mcg by mouth daily before breakfast., Disp: , Rfl:  .  Magnesium 500 MG TABS, Take by mouth., Disp: , Rfl:  .  metFORMIN (GLUCOPHAGE-XR) 500 MG 24 hr tablet, Take 1,000 mg by mouth 2 (two) times daily., Disp: , Rfl: 3 .  Omega-3 Fatty Acids (FISH OIL) 500 MG CAPS, Take 1 capsule by mouth 2 (two) times daily. , Disp: , Rfl:  .  omeprazole (PRILOSEC) 20 MG capsule, TAKE 1 CAPSULE BY MOUTH EVERY DAY, Disp: 90 capsule, Rfl: 1 .  ONE TOUCH ULTRA TEST test strip, CHECK BLOOD SUGAR TWICE DAILY, Disp: 100 each, Rfl: 3 .  ONETOUCH DELICA LANCETS 70J MISC, USE AS  DIRECTED TWICE DAILY, Disp: 200 each, Rfl: 3 .  RESTASIS MULTIDOSE 0.05 % ophthalmic emulsion, INSTILL 1 DROP BY OPHTHALMIC ROUTE EVERY 12 HOURS INTO AFFECTED EYE(S), Disp: , Rfl: 6 .  rosuvastatin (CRESTOR) 20 MG tablet, Take 10 mg by mouth daily. , Disp: , Rfl:  .  valsartan-hydrochlorothiazide (DIOVAN-HCT) 320-25 MG tablet, TAKE 1 TABLET BY MOUTH EVERY DAY, Disp: 90 tablet, Rfl: 2 .  warfarin (COUMADIN) 5 MG tablet, TAKE AS DIRECTED BY COUMADIN CLINIC. (Patient taking differently: TAKE AS DIRECTED BY COUMADIN CLINIC. 5 mg daily, but taking 2.5 mg on Wednesday only.), Disp: 90 tablet, Rfl: 1 .  Choline Fenofibrate 135 MG capsule, Take 135 mg by mouth daily.  , Disp: , Rfl:  .  Multiple Vitamin (MULTIVITAMIN) tablet, Take 1 tablet by mouth daily.  , Disp: , Rfl:  Medication Side Effects: Other: talkative and irritible from bupropion; worse sleep  Family Medical/ Social History: Changes? Yes H c/o her SE. Mother hx psych px and trauma. Brother schizophrenia.  MENTAL HEALTH EXAM:  There were no vitals taken for this visit.There is no height or weight on file to calculate BMI.  General Appearance: Casual  Eye Contact:  Good  Speech:  Clear and Coherent and Normal Rate, talkative  Volume:  Normal  Mood:  Depressed, irritable  Affect:  Appropriate  Thought Process:  Goal Directed  Orientation:  Full (Time,  Place, and Person)  Thought Content: WDL   Suicidal Thoughts:  No  Homicidal Thoughts:  No  Memory:  Recent  Judgement:  Good  Insight:  Fair  Psychomotor Activity:  Normal  Concentration:  Concentration: Good  Recall:  Good  Fund of Knowledge: Good  Language: Good  Akathisia:  No  AIMS (if indicated): not done  Assets:  Desire for Improvement Financial Resources/Insurance Housing Leisure Time Social Support Transportation  ADL's:  Intact  Cognition: WNL  Prognosis:  Fair    DIAGNOSES:    ICD-10-CM   1. Major depressive disorder, recurrent episode, moderate (HCC) F33.1    2. Panic disorder with agoraphobia F40.01   3. 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 We discussed her long history of depression which is recently under more poor control.  She did not experience improvement with the Wellbutrin and had side effects of being hyperverbal and irritable.  She already had some irritability pre-existing.  We discussed various alternative strategies.  Statistically the most likely thing to help his potentiation with Abilify.  We discussed side effects including EPS, weight gain, and agitation.  Discussed alternatives as well. We may need to consider strategies and/or continued an evaluation for her complaints of forgetfulness if that does not improve with the treatment of her depression.  Options for supplementation there could include NAC and vitamin D but she may need some lab tests to.   Wean Wellbutrin dt no benefit + SE. Over 10 days. Start Abilify 2mg  daily. (Reduce bupropion to 2 tablets daily for 5 days, then 1 tablet for 5 days then stop it. Wait until you start 1 bupropion in 5 days, then start aripiprazole 2 mg 1 tablet each am.)  Follow-up 4 weeks  Purnell Shoemaker, MD

## 2018-01-01 NOTE — Patient Instructions (Signed)
Reduce bupropion to 2 tablets daily for 5 days, then 1 tablet for 5 days then stop it. Wait until you start 1 bupropion in 5 days, then start aripiprazole 2 mg 1 tablet each am.

## 2018-01-02 ENCOUNTER — Ambulatory Visit: Payer: Medicare Other | Admitting: General Practice

## 2018-01-02 DIAGNOSIS — Z7901 Long term (current) use of anticoagulants: Secondary | ICD-10-CM | POA: Diagnosis not present

## 2018-01-02 DIAGNOSIS — I2699 Other pulmonary embolism without acute cor pulmonale: Secondary | ICD-10-CM | POA: Diagnosis not present

## 2018-01-02 DIAGNOSIS — Z5181 Encounter for therapeutic drug level monitoring: Secondary | ICD-10-CM

## 2018-01-02 LAB — POCT INR: INR: 3.3 — AB (ref 2.0–3.0)

## 2018-01-02 NOTE — Progress Notes (Signed)
I have reviewed and agree with this plan. I was available for consultation if needed 

## 2018-01-02 NOTE — Patient Instructions (Signed)
Pre visit review using our clinic review tool, if applicable. No additional management support is needed unless otherwise documented below in the visit note.  Hold dosage today and then change dosage and take 1 tablet daily except nothing on Wednesdays.  Re-check in 4 weeks.

## 2018-01-16 ENCOUNTER — Telehealth: Payer: Self-pay | Admitting: Internal Medicine

## 2018-01-16 MED ORDER — OMEPRAZOLE 20 MG PO CPDR: 20.0000 mg | DELAYED_RELEASE_CAPSULE | Freq: Two times a day (BID) | ORAL | 0 refills | Status: DC

## 2018-01-16 NOTE — Telephone Encounter (Signed)
Patient states her height and weight were incorrect on her AVS.

## 2018-01-16 NOTE — Telephone Encounter (Signed)
Spoke with the pt and notified new rx sent  I advised this is to help with stomach acid  She verbalized understanding  Nothing further needed

## 2018-01-29 ENCOUNTER — Other Ambulatory Visit: Payer: Self-pay | Admitting: Internal Medicine

## 2018-01-30 ENCOUNTER — Ambulatory Visit: Payer: Medicare Other | Admitting: General Practice

## 2018-01-30 DIAGNOSIS — Z7901 Long term (current) use of anticoagulants: Secondary | ICD-10-CM | POA: Diagnosis not present

## 2018-01-30 DIAGNOSIS — I2699 Other pulmonary embolism without acute cor pulmonale: Secondary | ICD-10-CM

## 2018-01-30 LAB — POCT INR: INR: 2.9 (ref 2.0–3.0)

## 2018-01-30 NOTE — Patient Instructions (Signed)
Pre visit review using our clinic review tool, if applicable. No additional management support is needed unless otherwise documented below in the visit note.  Continue to take 1 tablet daily except nothing on Wednesdays.  Re-check in 4 weeks.

## 2018-01-30 NOTE — Progress Notes (Signed)
I have reviewed and agree with this plan  

## 2018-02-01 ENCOUNTER — Other Ambulatory Visit: Payer: Self-pay | Admitting: Psychiatry

## 2018-02-08 ENCOUNTER — Other Ambulatory Visit: Payer: Self-pay

## 2018-02-08 MED ORDER — DULOXETINE HCL 60 MG PO CPEP: 60.0000 mg | ORAL_CAPSULE | Freq: Two times a day (BID) | ORAL | 0 refills | Status: DC

## 2018-02-10 ENCOUNTER — Other Ambulatory Visit: Payer: Self-pay | Admitting: Family Medicine

## 2018-02-11 ENCOUNTER — Other Ambulatory Visit: Payer: Self-pay

## 2018-02-13 ENCOUNTER — Ambulatory Visit: Payer: Medicare Other | Admitting: Psychiatry

## 2018-02-13 ENCOUNTER — Encounter: Payer: Self-pay | Admitting: Psychiatry

## 2018-02-13 DIAGNOSIS — R7989 Other specified abnormal findings of blood chemistry: Secondary | ICD-10-CM

## 2018-02-13 DIAGNOSIS — G4721 Circadian rhythm sleep disorder, delayed sleep phase type: Secondary | ICD-10-CM | POA: Diagnosis not present

## 2018-02-13 DIAGNOSIS — F4001 Agoraphobia with panic disorder: Secondary | ICD-10-CM | POA: Diagnosis not present

## 2018-02-13 DIAGNOSIS — F331 Major depressive disorder, recurrent, moderate: Secondary | ICD-10-CM | POA: Diagnosis not present

## 2018-02-13 MED ORDER — ARIPIPRAZOLE 2 MG PO TABS: 4.0000 mg | ORAL_TABLET | Freq: Every day | ORAL | 0 refills | Status: DC

## 2018-02-13 NOTE — Patient Instructions (Signed)
Start 2 of the 2mg  Abilify (aripiprazole) daily.  Get vitamin D blood test

## 2018-02-13 NOTE — Progress Notes (Signed)
[name] [id number] 1940-10-07 77 y.o.  Subjective:   Patient ID:  Andrea Simmons is a 77 y.o. (DOB 12-23-1940) female.  Chief Complaint:  Chief Complaint  Patient presents with  . unmotivated  . Medication Problem    made changes    HPI Andrea Simmons presents to the office today for follow-up of low energy and movitation and med changes.  Weaned off the Wellbutrin and started Abilify.  Less irritable now than a while.  Better energy and motivation is notable.  Don't feel quite as depressed.  Looking forward to holidays more.  More interested in decorating.  Better interest.  Better enjoyment.  I can see that it's helped but would like more of it than she does.  More talkative on the Abilify and the Wellbutrin.  Sleep better.  To sleep earlier and get up earlier in the am. Gained 7# and lost 2#.  Hungrier.  Never a fan of fall and winter. History of seasonal depression.  Review of Systems:  Review of Systems  Constitutional: Positive for unexpected weight change.  Musculoskeletal: Positive for joint swelling. Negative for neck stiffness.  Neurological: Negative for tremors and weakness.  Psychiatric/Behavioral: Negative for agitation, behavioral problems, confusion, decreased concentration, dysphoric mood, hallucinations, self-injury, sleep disturbance and suicidal ideas. The patient is not nervous/anxious and is not hyperactive.    Hx anemia.  Uncertain if had vitamin D checked  Cardiologist said she had a stiff heart.  Medications: I have reviewed the patient's current medications.  Current Outpatient Medications  Medication Sig Dispense Refill  . ARIPiprazole (ABILIFY) 2 MG tablet TAKE 1 TABLET BY MOUTH EVERY DAY 90 tablet 0  . aspirin 81 MG tablet Take 81 mg by mouth daily.      Marland Kitchen BIOTIN PO Take by mouth.    . Cholecalciferol (VITAMIN D3) 3000 units TABS Take by mouth.    . Choline Fenofibrate 135 MG capsule Take 135 mg by mouth daily.      . DULoxetine  (CYMBALTA) 60 MG capsule Take 1 capsule (60 mg total) by mouth 2 (two) times daily. 180 capsule 0  . Ferrous Sulfate (IRON) 325 (65 Fe) MG TABS Take by mouth.    . levothyroxine (SYNTHROID, LEVOTHROID) 50 MCG tablet Take 50 mcg by mouth daily before breakfast.    . Magnesium 500 MG TABS Take by mouth.    . metFORMIN (GLUCOPHAGE-XR) 500 MG 24 hr tablet Take 1,000 mg by mouth 2 (two) times daily.  3  . Multiple Vitamin (MULTIVITAMIN) tablet Take 1 tablet by mouth daily.      . Omega-3 Fatty Acids (FISH OIL) 500 MG CAPS Take 1 capsule by mouth 2 (two) times daily.     Marland Kitchen omeprazole (PRILOSEC) 20 MG capsule TAKE 1 CAPSULE (20 MG TOTAL) BY MOUTH 2 (TWO) TIMES DAILY BEFORE A MEAL. 180 capsule 0  . ONE TOUCH ULTRA TEST test strip CHECK BLOOD SUGAR TWICE DAILY 100 each 3  . ONETOUCH DELICA LANCETS 82N MISC USE AS DIRECTED TWICE DAILY 200 each 3  . RESTASIS MULTIDOSE 0.05 % ophthalmic emulsion INSTILL 1 DROP BY OPHTHALMIC ROUTE EVERY 12 HOURS INTO AFFECTED EYE(S)  6  . rosuvastatin (CRESTOR) 20 MG tablet Take 10 mg by mouth daily.     . valsartan-hydrochlorothiazide (DIOVAN-HCT) 320-25 MG tablet TAKE 1 TABLET BY MOUTH EVERY DAY 90 tablet 2  . warfarin (COUMADIN) 5 MG tablet TAKE AS DIRECTED BY COUMADIN CLINIC. 90 tablet 1   No current facility-administered medications for  this visit.     Medication Side Effects: None a little ankle swelling and increased appetitel  Allergies:  Allergies  Allergen Reactions  . Anaprox [Naproxen Sodium]   . Lipitor [Atorvastatin]   . Meperidine Hcl   . Morphine     Past Medical History:  Diagnosis Date  . Anemia   . Anxiety   . Breast cancer ([place])   . Depression   . Diabetes mellitus type II   . Fibromyalgia   . GERD (gastroesophageal reflux disease)   . Hyperlipidemia   . Hypertension   . Malignant neoplasm of breast (female), unspecified site 1993   L breast s/p mastectomy and tamoxifen x 55yrs  . 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 Unspecified hypothyroidism     Family History  Problem Relation Age of Onset  . Depression Other        Parent  . Arthritis Other        Parent, Grandparent  . Hypertension Other        Grandparent  . Hyperlipidemia Other        [place]  . Miscarriages / Stillbirths Other        Grandparent  . Stroke Other        [facility]  . Cancer Maternal Uncle        prostate  . Hypertension Maternal Grandfather   . Breast cancer Cousin   . Breast cancer Cousin     Social History   Socioeconomic History  . Marital status: Married    Spouse name: Not on file  . Number of children: Not on file  . Years of education: Not on file  . Highest education level: Not on file  Occupational History  . Occupation: Armed forces technical officer: RETIRED  . Occupation: Emergency planning/management officer  . Occupation: school bus driver  Social Needs  . Financial resource strain: Not on file  . Food insecurity:    Worry: Not on file    Inability: Not on file  . Transportation needs:    Medical: Not on file    Non-medical: Not on file  Tobacco Use  . Smoking status: Never Smoker  . Smokeless tobacco: Never Used  Substance and Sexual Activity  . Alcohol use: No    Alcohol/week: 0.0 standard drinks  . Drug use: No  . Sexual activity: Not on file  Lifestyle  . Physical activity:    Days per week: Not on file    Minutes per session: Not on file  . Stress: Not on file  Relationships  . Social connections:    Talks on phone: Not on file    Gets together: Not on file    Attends religious service: Not on file    Active member of club or organization: Not on file    Attends meetings of clubs or organizations: Not on file    Relationship status: Not on file  . Intimate partner violence:    Fear of current or ex partner: Not on file    Emotionally abused: Not on file    Physically abused: Not on file    Forced sexual activity: Not on file  Other Topics Concern  . Not on file   Social History Narrative  . Not on file    Past Medical History, Surgical history, Social history, and Family history were reviewed and updated as appropriate.   Please see review of systems for  further details on the patient's review from today.   Objective:   Physical Exam:  There were no vitals taken for this visit.  Physical Exam  Constitutional: She is oriented to person, place, and time. She appears well-developed. No distress.  Musculoskeletal: She exhibits no deformity.  Neurological: She is alert and oriented to person, place, and time. She displays no tremor. Coordination and gait normal.  Psychiatric: Her speech is normal and behavior is normal. Judgment and thought content normal. Her mood appears not anxious. Her affect is not angry, not blunt, not labile and not inappropriate. Cognition and memory are normal. She exhibits a depressed mood. She expresses no homicidal and no suicidal ideation. She expresses no suicidal plans and no homicidal plans.  Insight intact. No auditory or visual hallucinations. No delusions.  Somewhat depressed but better. Talkative. She is attentive.    Lab Review:     Component Value Date/Time   NA 137 11/20/2017 1553   NA 141 08/01/2016 1527   K 4.2 11/20/2017 1553   K 4.6 08/01/2016 1527   CL 103 11/20/2017 1553   CO2 25 11/20/2017 1553   CO2 24 08/01/2016 1527   GLUCOSE 99 11/20/2017 1553   GLUCOSE 93 08/01/2016 1527   BUN 23 11/20/2017 1553   BUN 19.9 08/01/2016 1527   CREATININE 1.00 11/20/2017 1553   CREATININE 0.9 08/01/2016 1527   CALCIUM 9.6 11/20/2017 1553   CALCIUM 9.7 08/01/2016 1527   PROT 6.8 10/03/2017 1122   PROT 7.2 08/01/2016 1527   PROT 6.8 08/01/2016 1527   ALBUMIN 4.4 10/03/2017 1122   ALBUMIN 4.0 08/01/2016 1527   AST 39 (H) 10/03/2017 1122   AST 41 (H) 08/01/2016 1527   ALT 21 10/03/2017 1122   ALT 25 08/01/2016 1527   ALKPHOS 32 (L) 10/03/2017 1122   ALKPHOS 48 08/01/2016 1527   BILITOT 0.6  10/03/2017 1122   BILITOT 0.33 08/01/2016 1527   GFRNONAA 84 (L) 09/01/2013 1638   GFRAA >90 09/01/2013 1638       Component Value Date/Time   WBC 5.1 11/20/2017 1553   RBC 3.72 (L) 11/20/2017 1553   HGB 11.2 (L) 11/20/2017 1553   HGB 9.5 (L) 08/01/2016 1527   HCT 33.3 (L) 11/20/2017 1553   HCT 31.2 (L) 08/01/2016 1527   PLT 201.0 11/20/2017 1553   PLT 221 08/01/2016 1527   MCV 89.6 11/20/2017 1553   MCV 83.9 08/01/2016 1527   MCH 25.5 08/01/2016 1527   MCH 29.4 09/01/2013 1638   MCHC 33.5 11/20/2017 1553   RDW 15.1 11/20/2017 1553   RDW 17.2 (H) 08/01/2016 1527   LYMPHSABS 1.7 11/20/2017 1553   LYMPHSABS 2.0 08/01/2016 1527   MONOABS 0.5 11/20/2017 1553   MONOABS 0.4 08/01/2016 1527   EOSABS 0.1 11/20/2017 1553   EOSABS 0.1 08/01/2016 1527   BASOSABS 0.0 11/20/2017 1553   BASOSABS 0.0 08/01/2016 1527    No results found for: POCLITH, LITHIUM   No results found for: PHENYTOIN, PHENOBARB, VALPROATE, CBMZ   Endo checked thyroid lately.  .res Assessment: Plan:    Major depressive disorder, recurrent episode, moderate ([place]) - Plan: Vitamin D 1,25 dihydroxy  Delayed sleep phase syndrome  Panic disorder with agoraphobia  Low vitamin D level - Plan: Vitamin D 1,25 dihydroxy   Sleep disorder is better with Abilify  DT seasonality, check vitamin D level.  TSH was checked.  Abilify is helping somewhat.  Consider increase but this may not help anymore.  Hx anemia (keep taking  the iron; it appears to be helping) dn the cardiac problem may be affected the energy problem.  Will try increasing the Abilify to 4mg  daily to see if there's more improvement.  Discussed potential metabolic side effects associated with atypical antipsychotics, as well as potential risk for movement side effects. Advised pt to contact office if movement side effects occur.   This appt was 30 mins.  FU 2 mos  Lynder Parents, MD, DFAPA   Please see After Visit Summary for patient specific  instructions.  Future Appointments  Date Time Provider [place]  02/27/2018  1:15 PM [facility]    Orders Placed This Encounter  Procedures  . Vitamin D 1,25 dihydroxy      -------------------------------

## 2018-02-19 ENCOUNTER — Telehealth: Payer: Self-pay

## 2018-02-19 ENCOUNTER — Telehealth: Payer: Self-pay | Admitting: Family Medicine

## 2018-02-19 NOTE — Telephone Encounter (Signed)
Please advise if patient needs an appointment for leg cramps

## 2018-02-19 NOTE — Telephone Encounter (Signed)
Copied from [place] [phone]. Topic: General - Other >> Feb 19, 2018 12:02 PM Keene Breath wrote: Reason for CRM: Patient called to request a medication that she use to take for cramps in her legs last year from pain management.  Patient was not sure if she needed an appointment, but wanted to know whether Dr. Birdie Riddle could call this medication in which helped her cramps, Tizanidine HCL 2mg .  Please advise and call patient back as soon as possible.  CB# [phone]

## 2018-02-19 NOTE — Telephone Encounter (Signed)
Prior authorization approved for aripiprazole 2mg  tablets 2 tablets per day, through 03/20/2019

## 2018-02-19 NOTE — Telephone Encounter (Signed)
Please advise pt was seen 09/2017 for DM and has not been seen since would you like for her to come in and discuss both issues?

## 2018-02-20 NOTE — Telephone Encounter (Signed)
Pt was instructed in July to schedule her complete physical in 6 months.  She did not schedule and will likely not be able to get a CPE for many months.  So at this time, she needs to schedule a DM f/u and we can discuss her leg cramps and the muscle relaxer at that time

## 2018-02-20 NOTE — Telephone Encounter (Signed)
Called and spoke with pt. She was scheduled for 12/19 for a Dm follow up and to discuss leg cramps

## 2018-02-22 ENCOUNTER — Other Ambulatory Visit: Payer: Self-pay | Admitting: Family Medicine

## 2018-02-27 ENCOUNTER — Ambulatory Visit: Payer: Medicare Other | Admitting: General Practice

## 2018-02-27 DIAGNOSIS — Z7901 Long term (current) use of anticoagulants: Secondary | ICD-10-CM

## 2018-02-27 DIAGNOSIS — I2699 Other pulmonary embolism without acute cor pulmonale: Secondary | ICD-10-CM

## 2018-02-27 LAB — POCT INR: INR: 2.6 (ref 2.0–3.0)

## 2018-02-27 NOTE — Progress Notes (Signed)
I have reviewed and agree with this plan  

## 2018-02-27 NOTE — Patient Instructions (Addendum)
Pre visit review using our clinic review tool, if applicable. No additional management support is needed unless otherwise documented below in the visit note.  Continue to take 1 tablet daily except nothing on Wednesdays.  Re-check in 4 weeks.

## 2018-02-28 ENCOUNTER — Telehealth: Payer: Self-pay | Admitting: Emergency Medicine

## 2018-02-28 NOTE — Telephone Encounter (Signed)
Copied from [place] [phone]. Topic: Medicare AWV >> Feb 28, 2018  4:44 PM Virl Axe D wrote: Reason for CRM: Pt is scheduled on 07/16/18 for her physical and needs to schedule her annual wellness visit for the same day. Please contact pt to schedule

## 2018-03-01 NOTE — Telephone Encounter (Signed)
LMOVM asking pt to CB to schedule an AWV w/Kim ([facility]), we are not able to do a same day as her CPE w/Dr. Birdie Riddle due to Maudie Mercury being at [place] office on [address]. Cpe appt is scheduled for a Tues. Please schedule when pt calls back.

## 2018-03-05 ENCOUNTER — Other Ambulatory Visit: Payer: Self-pay | Admitting: Family Medicine

## 2018-03-07 ENCOUNTER — Ambulatory Visit: Payer: Medicare Other | Admitting: Family Medicine

## 2018-03-21 ENCOUNTER — Other Ambulatory Visit: Payer: Self-pay | Admitting: Family Medicine

## 2018-03-27 ENCOUNTER — Ambulatory Visit: Payer: Medicare Other | Admitting: General Practice

## 2018-03-27 ENCOUNTER — Emergency Department (HOSPITAL_COMMUNITY)
Admission: EM | Admit: 2018-03-27 | Discharge: 2018-03-27 | Disposition: A | Discharge status: Home / Self Care | Payer: Medicare Other | Attending: Emergency Medicine | Admitting: Emergency Medicine

## 2018-03-27 ENCOUNTER — Other Ambulatory Visit: Payer: Self-pay

## 2018-03-27 ENCOUNTER — Emergency Department (HOSPITAL_COMMUNITY): Payer: Medicare Other

## 2018-03-27 ENCOUNTER — Encounter (HOSPITAL_COMMUNITY): Payer: Self-pay | Admitting: Emergency Medicine

## 2018-03-27 DIAGNOSIS — I10 Essential (primary) hypertension: Secondary | ICD-10-CM | POA: Insufficient documentation

## 2018-03-27 DIAGNOSIS — M5431 Sciatica, right side: Secondary | ICD-10-CM | POA: Diagnosis not present

## 2018-03-27 DIAGNOSIS — E119 Type 2 diabetes mellitus without complications: Secondary | ICD-10-CM | POA: Insufficient documentation

## 2018-03-27 DIAGNOSIS — Z7901 Long term (current) use of anticoagulants: Secondary | ICD-10-CM

## 2018-03-27 DIAGNOSIS — I2699 Other pulmonary embolism without acute cor pulmonale: Secondary | ICD-10-CM

## 2018-03-27 DIAGNOSIS — M79604 Pain in right leg: Secondary | ICD-10-CM | POA: Diagnosis present

## 2018-03-27 LAB — POCT INR: INR: 2.4 (ref 2.0–3.0)

## 2018-03-27 MED ORDER — PREDNISONE 20 MG PO TABS: 20.0000 mg | ORAL_TABLET | Freq: Every day | ORAL | 0 refills | Status: AC

## 2018-03-27 NOTE — Discharge Instructions (Addendum)
You were evaluated in the Emergency Department and after careful evaluation, we did not find any emergent condition requiring admission or further testing in the hospital.  Your symptoms today seem to be due to sciatica, or pain related to inflammation of a specific nerve that runs down the back of your leg.  Please take the medication provided to see if this helps her pain.  Is also important that you mention this pain to your regular doctor.  Please return to the Emergency Department if you experience any worsening of your condition.  We encourage you to follow up with a primary care provider.  Thank you for allowing [place] to be a part of your care.

## 2018-03-27 NOTE — Patient Instructions (Addendum)
Pre visit review using our clinic review tool, if applicable. No additional management support is needed unless otherwise documented below in the visit note.  Continue to take 1 tablet daily except nothing on Wednesdays.  Re-check in 6 weeks.

## 2018-03-27 NOTE — ED Triage Notes (Signed)
Patient from home, c/o burning right hip that travels all the way down her leg for 3 weeks. Patient denies any recent injury.

## 2018-03-27 NOTE — Progress Notes (Signed)
I have reviewed and agree with this plan  

## 2018-03-27 NOTE — ED Notes (Signed)
Patient given discharge instructions and verbalized understanding.  Patient stable to discharge at this time.  Patient is alert and oriented to baseline.  No distressed noted at this time.  All belongings taken with the patient at discharge.   

## 2018-03-27 NOTE — ED Provider Notes (Signed)
[facility] Emergency Department Provider Note MRN:  [id number]  Arrival date & time: 03/27/18     Chief Complaint   Leg Pain   History of Present Illness   Andrea Simmons is a 78 y.o. year-old female with a history of breast cancer, diabetes, pulmonary embolism presenting to the ED with chief complaint of leg pain.  Pain is been present for at least 3 weeks.  The pain is intermittent, used to occur once per day, today occurring with a longer duration and more frequently.  The pain is located in the right buttocks and radiates down the right thigh.  Described as a sharp pain.  Pain is moderate in severity, no exacerbating or relieving factors.  Patient denies any other symptoms, no headache or vision change, no chest pain or shortness of breath, no abdominal pain, no bowel or bladder dysfunction, no numbness weakness to the arms or legs, no new leg pain or swelling.  Review of Systems  A complete 10 system review of systems was obtained and all systems are negative except as noted in the HPI and PMH.   Patient's Health History    Past Medical History:  Diagnosis Date  . Anemia   . Anxiety   . Breast cancer ([place])   . Depression   . Diabetes mellitus type II   . Fibromyalgia   . GERD (gastroesophageal reflux disease)   . Hyperlipidemia   . Hypertension   . Malignant neoplasm of breast (female), unspecified site 1993   L breast s/p mastectomy and tamoxifen x 66yrs  . 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 Unspecified hypothyroidism     Past Surgical History:  Procedure Laterality Date  . APPENDECTOMY    . BREAST BIOPSY Left 1993  . BREAST BIOPSY Right 09/25/2014   stero. Benign  . BREAST RECONSTRUCTION Left   . CATARACT EXTRACTION    . MASTECTOMY Left   . ROTATOR CUFF REPAIR    . SPINAL FUSION      x 2  . TOTAL ABDOMINAL HYSTERECTOMY W/ BILATERAL SALPINGOOPHORECTOMY    . TUBAL LIGATION      Family History   Problem Relation Age of Onset  . Depression Other        Parent  . Arthritis Other        Parent, Grandparent  . Hypertension Other        Grandparent  . Hyperlipidemia Other        [place]  . Miscarriages / Stillbirths Other        Grandparent  . Stroke Other        [facility]  . Cancer Maternal Uncle        prostate  . Hypertension Maternal Grandfather   . Breast cancer Cousin   . Breast cancer Cousin     Social History   Socioeconomic History  . Marital status: Married    Spouse name: Not on file  . Number of children: Not on file  . Years of education: Not on file  . Highest education level: Not on file  Occupational History  . Occupation: Armed forces technical officer: RETIRED  . Occupation: Emergency planning/management officer  . Occupation: school bus driver  Social Needs  . Financial resource strain: Not on file  . Food insecurity:    Worry: Not on file    Inability: Not on file  . Transportation needs:    Medical: Not  on file    Non-medical: Not on file  Tobacco Use  . Smoking status: Never Smoker  . Smokeless tobacco: Never Used  Substance and Sexual Activity  . Alcohol use: No    Alcohol/week: 0.0 standard drinks  . Drug use: No  . Sexual activity: Not on file  Lifestyle  . Physical activity:    Days per week: Not on file    Minutes per session: Not on file  . Stress: Not on file  Relationships  . Social connections:    Talks on phone: Not on file    Gets together: Not on file    Attends religious service: Not on file    Active member of club or organization: Not on file    Attends meetings of clubs or organizations: Not on file    Relationship status: Not on file  . Intimate partner violence:    Fear of current or ex partner: Not on file    Emotionally abused: Not on file    Physically abused: Not on file    Forced sexual activity: Not on file  Other Topics Concern  . Not on file  Social History Narrative  . Not on file     Physical Exam  Vital Signs and Nursing  Notes reviewed Vitals:   03/27/18 1603  BP: (!) 141/76  Pulse: 77  Resp: 16  Temp: 98 F (36.7 C)  SpO2: 100%    CONSTITUTIONAL: Well-appearing, NAD NEURO:  Alert and oriented x 3, no focal deficits EYES:  eyes equal and reactive ENT/NECK:  no LAD, no JVD CARDIO: Regular rate, well-perfused, normal S1 and S2 PULM:  CTAB no wheezing or rhonchi GI/GU:  normal bowel sounds, non-distended, non-tender MSK/SPINE:  No gross deformities, no edema, normal range of motion of bilateral hips and knees, negative straight leg test bilaterally SKIN:  no rash, atraumatic PSYCH:  Appropriate speech and behavior  Diagnostic and Interventional Summary    Labs Reviewed - No data to display  DG Lumbar Spine Complete  Final Result      Medications - No data to display   Procedures Critical Care  ED Course and Medical Decision Making  I have reviewed the triage vital signs and the nursing notes.  Pertinent labs & imaging results that were available during my care of the patient were reviewed by me and considered in my medical decision making (see below for details).  Favoring sciatica in the 78 year old female multiple comorbidities.  Nothing on exam to suggest DVT, which would be quite unlikely given the intermittent history.  Given patient's history of breast cancer and long duration of pain, will obtain plain film of the lumbar spine to exclude neoplastic process as the cause of the sciatica.  X-ray unremarkable, patient continues to look and feel well, will provide low-dose prednisone burst.  Patient informed of the possibility of her blood sugars being elevated, patient agrees to check her blood sugars often.  After the discussed management above, the patient was determined to be safe for discharge.  The patient was in agreement with this plan and all questions regarding their care were answered.  ED return precautions were discussed and the patient will return to the ED with any significant  worsening of condition.  Barth Kirks. [place]onia Small, [place] [email or web address]  Final Clinical Impressions(s) / ED Diagnoses     ICD-10-CM   1. Sciatica of right side M54.31     ED Discharge Orders  Ordered    predniSONE (DELTASONE) 20 MG tablet  Daily     03/27/18 1811             Maudie Flakes, MD 03/27/18 1814

## 2018-04-07 LAB — VITAMIN D 1,25 DIHYDROXY
Vitamin D 1, 25 (OH)2 Total: 40 pg/mL
Vitamin D2 1, 25 (OH)2: <10 pg/mL
Vitamin D3 1, 25 (OH)2: 40 pg/mL

## 2018-04-08 ENCOUNTER — Other Ambulatory Visit: Payer: Self-pay | Admitting: Psychiatry

## 2018-04-08 MED ORDER — VITAMIN D (ERGOCALCIFEROL) 1.25 MG (50000 UNIT) PO CAPS: 50000.0000 [IU] | ORAL_CAPSULE | ORAL | 5 refills | Status: DC

## 2018-04-08 NOTE — Progress Notes (Signed)
vitamind Rx with 5 RF

## 2018-04-11 NOTE — Progress Notes (Signed)
Pt's Husband stated she did pick up and start her new Rx for the Vitamin D.

## 2018-04-15 ENCOUNTER — Other Ambulatory Visit: Payer: Self-pay | Admitting: Psychiatry

## 2018-04-15 ENCOUNTER — Encounter: Payer: Self-pay | Admitting: Psychiatry

## 2018-04-15 ENCOUNTER — Ambulatory Visit: Payer: Medicare Other | Admitting: Psychiatry

## 2018-04-15 DIAGNOSIS — F4001 Agoraphobia with panic disorder: Secondary | ICD-10-CM | POA: Diagnosis not present

## 2018-04-15 DIAGNOSIS — F411 Generalized anxiety disorder: Secondary | ICD-10-CM | POA: Diagnosis not present

## 2018-04-15 DIAGNOSIS — G4721 Circadian rhythm sleep disorder, delayed sleep phase type: Secondary | ICD-10-CM | POA: Diagnosis not present

## 2018-04-15 DIAGNOSIS — F338 Other recurrent depressive disorders: Secondary | ICD-10-CM | POA: Diagnosis not present

## 2018-04-15 MED ORDER — BUSPIRONE HCL 30 MG PO TABS: ORAL_TABLET | ORAL | 1 refills | Status: DC

## 2018-04-15 NOTE — Progress Notes (Signed)
Andrea Simmons [id number] 1940/07/06 78 y.o.  Subjective:   Patient ID:  Andrea Simmons is a 78 y.o. (DOB October 29, 1940) female.  Chief Complaint:  Chief Complaint  Patient presents with  . Follow-up    Medication Management  . Fatigue    HPI last seen February 13, 2018 Andrea Simmons presents to the office today.  At her last visit she was having more depressive symptoms and fatigue.  We reduced the Abilify from 5 mg to 4 mg to see if it would help and energy.  She has been having anemia which is certainly contributing to the fatigue.  Also a vitamin D level was obtained obtained and the level was 40.  Therefore,She is already on about 3000 units daily. So we increased the dosage. Rx dose vitamin D 50,000 units weekly. This may help mood, energy, and concentration.  "Getting fat"  Gaining 11-12# since on the Abilify.  Not happy with that.  Didn't gain right away.  Not now seeing enough to help to justify the weight gain.  Got through [place] with less depression than usual.  When weather changes mood sis better.  Some days sleeps until 10 or later.   for follow-up of low energy and movitation and med changes.  Weaned off the Wellbutrin and started Abilify.  Less irritable now than a while.  Better energy and motivation is notable.  Don't feel quite as depressed.  Looking forward to holidays more.  More interested in decorating.  Better interest.  Better enjoyment.  I can see that it's helped but would like more of it than she does.  More talkative on the Abilify and the Wellbutrin.  Sleep better.  To sleep earlier and get up earlier in the am. Gained 7# and lost 2#.  Hungrier.  Never a fan of fall and winter. History of seasonal depression.  Past Psychiatric Medication Trials: Rozerem, zolpidem. Duloxetine 120, Paxil 20 for years, Sertraline, fluoxetine, Wellbutrin, Abilify weight gain, lithium 2004 SE tremor at 600mg  daily. Low dose nortriptyline.  Review of Systems:  Review of  Systems  Constitutional: Positive for unexpected weight change.  Musculoskeletal: Positive for back pain and joint swelling. Negative for neck stiffness.  Neurological: Negative for tremors, weakness and numbness.  Psychiatric/Behavioral: Positive for dysphoric mood. Negative for agitation, behavioral problems, confusion, decreased concentration, hallucinations, self-injury, sleep disturbance and suicidal ideas. The patient is nervous/anxious. The patient is not hyperactive.    Hx anemia.    Cardiologist said she had a stiff heart.  Medications: I have reviewed the patient's current medications.  Current Outpatient Medications  Medication Sig Dispense Refill  . ARIPiprazole (ABILIFY) 2 MG tablet Take 2 tablets (4 mg total) by mouth daily. 180 tablet 0  . aspirin 81 MG tablet Take 81 mg by mouth daily.      Marland Kitchen BIOTIN PO Take by mouth.    . Cholecalciferol (VITAMIN D3) 3000 units TABS Take by mouth.    . Choline Fenofibrate 135 MG capsule Take 135 mg by mouth daily.      . DULoxetine (CYMBALTA) 60 MG capsule Take 1 capsule (60 mg total) by mouth 2 (two) times daily. 180 capsule 0  . Ferrous Sulfate (IRON) 325 (65 Fe) MG TABS Take by mouth.    . levothyroxine (SYNTHROID, LEVOTHROID) 50 MCG tablet Take 50 mcg by mouth daily before breakfast.    . Magnesium 500 MG TABS Take by mouth.    . metFORMIN (GLUCOPHAGE-XR) 500 MG 24 hr tablet Take 1,000  mg by mouth 2 (two) times daily.  3  . Multiple Vitamin (MULTIVITAMIN) tablet Take 1 tablet by mouth daily.      . Omega-3 Fatty Acids (FISH OIL) 500 MG CAPS Take 1 capsule by mouth 2 (two) times daily.     Marland Kitchen omeprazole (PRILOSEC) 20 MG capsule TAKE 1 CAPSULE (20 MG TOTAL) BY MOUTH 2 (TWO) TIMES DAILY BEFORE A MEAL. 180 capsule 0  . ONE TOUCH ULTRA TEST test strip CHECK BLOOD SUGAR TWICE DAILY 100 each 3  . ONETOUCH DELICA LANCETS 84X MISC USE AS DIRECTED TWICE DAILY 200 each 3  . RESTASIS MULTIDOSE 0.05 % ophthalmic emulsion INSTILL 1 DROP BY  OPHTHALMIC ROUTE EVERY 12 HOURS INTO AFFECTED EYE(S)  6  . rosuvastatin (CRESTOR) 20 MG tablet Take 10 mg by mouth daily.     Marland Kitchen tiZANidine (ZANAFLEX) 2 MG tablet TAKE 1 TABLET BY MOUTH TWICE A DAY AS NEEDED 60 tablet 2  . valsartan-hydrochlorothiazide (DIOVAN-HCT) 320-25 MG tablet TAKE 1 TABLET BY MOUTH EVERY DAY 90 tablet 2  . Vitamin D, Ergocalciferol, (DRISDOL) 1.25 MG (50000 UT) CAPS capsule Take 1 capsule (50,000 Units total) by mouth every 7 (seven) days. 5 capsule 5  . warfarin (COUMADIN) 5 MG tablet TAKE AS DIRECTED BY COUMADIN CLINIC. 90 tablet 1  . busPIRone (BUSPAR) 30 MG tablet Start BuSpar 30 mg 1/3 tablet twice daily for 1 week, then increase to 2/3 tablet twice daily for 1 week, then increase to 1 tablet twice daily for anxiety. 60 tablet 1   No current facility-administered medications for this visit.     Medication Side Effects: None a little ankle swelling and increased appetitel  Allergies:  Allergies  Allergen Reactions  . Anaprox [Naproxen Sodium]   . Lipitor [Atorvastatin]   . Meperidine Hcl   . Morphine     Past Medical History:  Diagnosis Date  . Anemia   . Anxiety   . Breast cancer ([place])   . Depression   . Diabetes mellitus type II   . Fibromyalgia   . GERD (gastroesophageal reflux disease)   . Hyperlipidemia   . Hypertension   . Malignant neoplasm of breast (female), unspecified site 1993   L breast s/p mastectomy and tamoxifen x 70yrs  . 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 Unspecified hypothyroidism     Family History  Problem Relation Age of Onset  . Depression Other        Parent  . Arthritis Other        Parent, Grandparent  . Hypertension Other        Grandparent  . Hyperlipidemia Other        [place]  . Miscarriages / Stillbirths Other        Grandparent  . Stroke Other        [facility]  . Cancer Maternal Uncle        prostate  . Hypertension Maternal Grandfather   . Breast cancer Cousin    . Breast cancer Cousin     Social History   Socioeconomic History  . Marital status: Married    Spouse name: Not on file  . Number of children: Not on file  . Years of education: Not on file  . Highest education level: Not on file  Occupational History  . Occupation: Armed forces technical officer: RETIRED  . Occupation: Emergency planning/management officer  . Occupation: school bus driver  Social Needs  .  Financial resource strain: Not on file  . Food insecurity:    Worry: Not on file    Inability: Not on file  . Transportation needs:    Medical: Not on file    Non-medical: Not on file  Tobacco Use  . Smoking status: Never Smoker  . Smokeless tobacco: Never Used  Substance and Sexual Activity  . Alcohol use: No    Alcohol/week: 0.0 standard drinks  . Drug use: No  . Sexual activity: Not on file  Lifestyle  . Physical activity:    Days per week: Not on file    Minutes per session: Not on file  . Stress: Not on file  Relationships  . Social connections:    Talks on phone: Not on file    Gets together: Not on file    Attends religious service: Not on file    Active member of club or organization: Not on file    Attends meetings of clubs or organizations: Not on file    Relationship status: Not on file  . Intimate partner violence:    Fear of current or ex partner: Not on file    Emotionally abused: Not on file    Physically abused: Not on file    Forced sexual activity: Not on file  Other Topics Concern  . Not on file  Social History Narrative  . Not on file    Past Medical History, Surgical history, Social history, and Family history were reviewed and updated as appropriate.   Please see review of systems for further details on the patient's review from today.   Objective:   Physical Exam:  There were no vitals taken for this visit.  Physical Exam Constitutional:      General: She is not in acute distress.    Appearance: She is well-developed.  Musculoskeletal:         General: No deformity.  Neurological:     Mental Status: She is alert and oriented to person, place, and time.     Motor: No tremor.     Coordination: Coordination normal.     Gait: Gait normal.  Psychiatric:        Attention and Perception: Attention and perception normal. She is attentive.        Mood and Affect: Mood is anxious and depressed. Affect is not labile, blunt, angry or inappropriate.        Speech: Speech normal.        Behavior: Behavior normal.        Thought Content: Thought content normal. Thought content does not include homicidal or suicidal ideation. Thought content does not include homicidal or suicidal plan.        Cognition and Memory: Cognition normal.        Judgment: Judgment normal.     Comments: Insight intact. No auditory or visual hallucinations. No delusions.  Somewhat depressed but better. Talkative.     Lab Review:     Component Value Date/Time   NA 137 11/20/2017 1553   NA 141 08/01/2016 1527   K 4.2 11/20/2017 1553   K 4.6 08/01/2016 1527   CL 103 11/20/2017 1553   CO2 25 11/20/2017 1553   CO2 24 08/01/2016 1527   GLUCOSE 99 11/20/2017 1553   GLUCOSE 93 08/01/2016 1527   BUN 23 11/20/2017 1553   BUN 19.9 08/01/2016 1527   CREATININE 1.00 11/20/2017 1553   CREATININE 0.9 08/01/2016 1527   CALCIUM 9.6 11/20/2017 1553   CALCIUM  9.7 08/01/2016 1527   PROT 6.8 10/03/2017 1122   PROT 7.2 08/01/2016 1527   PROT 6.8 08/01/2016 1527   ALBUMIN 4.4 10/03/2017 1122   ALBUMIN 4.0 08/01/2016 1527   AST 39 (H) 10/03/2017 1122   AST 41 (H) 08/01/2016 1527   ALT 21 10/03/2017 1122   ALT 25 08/01/2016 1527   ALKPHOS 32 (L) 10/03/2017 1122   ALKPHOS 48 08/01/2016 1527   BILITOT 0.6 10/03/2017 1122   BILITOT 0.33 08/01/2016 1527   GFRNONAA 84 (L) 09/01/2013 1638   GFRAA >90 09/01/2013 1638       Component Value Date/Time   WBC 5.1 11/20/2017 1553   RBC 3.72 (L) 11/20/2017 1553   HGB 11.2 (L) 11/20/2017 1553   HGB 9.5 (L) 08/01/2016 1527    HCT 33.3 (L) 11/20/2017 1553   HCT 31.2 (L) 08/01/2016 1527   PLT 201.0 11/20/2017 1553   PLT 221 08/01/2016 1527   MCV 89.6 11/20/2017 1553   MCV 83.9 08/01/2016 1527   MCH 25.5 08/01/2016 1527   MCH 29.4 09/01/2013 1638   MCHC 33.5 11/20/2017 1553   RDW 15.1 11/20/2017 1553   RDW 17.2 (H) 08/01/2016 1527   LYMPHSABS 1.7 11/20/2017 1553   LYMPHSABS 2.0 08/01/2016 1527   MONOABS 0.5 11/20/2017 1553   MONOABS 0.4 08/01/2016 1527   EOSABS 0.1 11/20/2017 1553   EOSABS 0.1 08/01/2016 1527   BASOSABS 0.0 11/20/2017 1553   BASOSABS 0.0 08/01/2016 1527    No results found for: POCLITH, LITHIUM   No results found for: PHENYTOIN, PHENOBARB, VALPROATE, CBMZ   Endo checked thyroid lately.   Vitamin D level on April 01, 2018 reported as 40. .res Assessment: Plan:    Seasonal depression ([place])  Generalized anxiety disorder  Panic disorder with agoraphobia  Delayed sleep phase syndrome   Greater than 50% of face to face time with patient was spent on counseling and coordination of care. We discussed several things.  Sleep disorder is better with Abilify, but weight gain and feels she's lost benefit. So stop it.  DT seasonality, check vitamin D level.  TSH was checked.  Vitamin D needs more time to work.  Option light therapy.  Disc in detail.  Gave handout.  Options buspirone, pramipexole, Rexulti, Vraylar, change to Trintellix, stimulant off label.  Disc SE and options.  She prefers buspirone 10 to 30 BID.  Discussed potential metabolic side effects associated with atypical antipsychotics, as well as potential risk for movement side effects. Advised pt to contact office if movement side effects occur.   This appt was 30 mins.  FU 2 mos  Lynder Parents, MD, DFAPA   Please see After Visit Summary for patient specific instructions.  Future Appointments  Date Time Provider [place]  05/08/2018  1:45 PM [facility] COUMADIN [facility]  07/04/2018  2:00 PM [facility]  HEALTH COACH [facility]  07/16/2018 10:00 AM [name], MD [facility]    No orders of the defined types were placed in this encounter.     -------------------------------

## 2018-05-04 ENCOUNTER — Other Ambulatory Visit: Payer: Self-pay | Admitting: Psychiatry

## 2018-05-07 ENCOUNTER — Other Ambulatory Visit: Payer: Self-pay | Admitting: Psychiatry

## 2018-05-08 ENCOUNTER — Ambulatory Visit: Payer: Medicare Other | Admitting: General Practice

## 2018-05-08 DIAGNOSIS — I2699 Other pulmonary embolism without acute cor pulmonale: Secondary | ICD-10-CM

## 2018-05-08 DIAGNOSIS — Z7901 Long term (current) use of anticoagulants: Secondary | ICD-10-CM

## 2018-05-08 LAB — POCT INR: INR: 5.5 — AB (ref 2.0–3.0)

## 2018-05-08 NOTE — Patient Instructions (Addendum)
Pre visit review using our clinic review tool, if applicable. No additional management support is needed unless otherwise documented below in the visit note.  Hold coumadin today, tomorrow and Friday.  On Saturday continue to take 1 tablet daily except nothing on Wednesdays.  Re-check in 1 week.

## 2018-05-08 NOTE — Progress Notes (Signed)
I have reviewed and agree with this plan  

## 2018-05-15 ENCOUNTER — Ambulatory Visit: Payer: Medicare Other | Admitting: General Practice

## 2018-05-15 DIAGNOSIS — Z7901 Long term (current) use of anticoagulants: Secondary | ICD-10-CM

## 2018-05-15 DIAGNOSIS — I2699 Other pulmonary embolism without acute cor pulmonale: Secondary | ICD-10-CM

## 2018-05-15 LAB — POCT INR: INR: 2.4 (ref 2.0–3.0)

## 2018-05-15 NOTE — Patient Instructions (Addendum)
Pre visit review using our clinic review tool, if applicable. No additional management support is needed unless otherwise documented below in the visit note.  Change dosage to 1 tablet daily except 1/2 on Mondays and nothing on Wednesdays.  Re-check in 2 weeks.

## 2018-05-15 NOTE — Progress Notes (Signed)
I have reviewed and agree with this plan  

## 2018-05-17 ENCOUNTER — Other Ambulatory Visit: Payer: Self-pay | Admitting: Family Medicine

## 2018-05-24 ENCOUNTER — Other Ambulatory Visit: Payer: Self-pay | Admitting: Psychiatry

## 2018-05-28 ENCOUNTER — Ambulatory Visit: Payer: Medicare Other | Admitting: Psychiatry

## 2018-05-28 ENCOUNTER — Encounter: Payer: Self-pay | Admitting: Psychiatry

## 2018-05-28 DIAGNOSIS — F338 Other recurrent depressive disorders: Secondary | ICD-10-CM | POA: Diagnosis not present

## 2018-05-28 DIAGNOSIS — G4721 Circadian rhythm sleep disorder, delayed sleep phase type: Secondary | ICD-10-CM | POA: Diagnosis not present

## 2018-05-28 DIAGNOSIS — F411 Generalized anxiety disorder: Secondary | ICD-10-CM | POA: Diagnosis not present

## 2018-05-28 DIAGNOSIS — F4001 Agoraphobia with panic disorder: Secondary | ICD-10-CM | POA: Diagnosis not present

## 2018-05-28 DIAGNOSIS — R7989 Other specified abnormal findings of blood chemistry: Secondary | ICD-10-CM

## 2018-05-28 NOTE — Patient Instructions (Addendum)
Vraylar 1.5 mg 1 every other day in the morning.  Stop buspirone  If not feeling better in 2 weeks call

## 2018-05-28 NOTE — Progress Notes (Signed)
Andrea Simmons [id number] [birth year] 78 y.o.  Subjective:   Patient ID:  Andrea Simmons is a 78 y.o. (DOB Oct 08, 1940) female.  Chief Complaint:  No chief complaint on file.   HPI  Andrea Simmons presents to the office today.    last seen April 15, 2018.  She has had a recent relapse of depression and anxiety.  She felt she was losing benefit from Abilify and it was discontinued.  She also felt she was having weight gain with Abilify and therefore we picked an option with low weight gain risk.  She started buspirone.  Buspirone caused HA she thinks.  No benefit.  Going backwards in interest and motivation.  Wants to sit at home and mope.  Hopeful about spring.  Depressed.  Some anxiety but not real bad.  Sleeping a lot in daytime and laying around.   Was More talkative on the Abilify and the Wellbutrin.   Never a fan of fall and winter. History of seasonal depression.  Past Psychiatric Medication Trials: Rozerem, zolpidem. Duloxetine 120, Paxil 20 for years, Sertraline, fluoxetine, Wellbutrin, Abilify weight gain and loss benefit, lithium 2004 SE tremor at 600mg  daily. Low dose nortriptyline.  Review of Systems:  Review of Systems  Constitutional: Positive for unexpected weight change.  Musculoskeletal: Positive for back pain and joint swelling. Negative for neck stiffness.  Neurological: Negative for tremors, weakness and numbness.  Psychiatric/Behavioral: Positive for dysphoric mood. Negative for agitation, behavioral problems, confusion, decreased concentration, hallucinations, self-injury, sleep disturbance and suicidal ideas. The patient is nervous/anxious. The patient is not hyperactive.    Hx anemia.    Cardiologist said she had a stiff heart.  Medications: I have reviewed the patient's current medications.  Current Outpatient Medications  Medication Sig Dispense Refill  . ARIPiprazole (ABILIFY) 2 MG tablet Take 2 tablets (4 mg total) by mouth daily. (Patient not  taking: Reported on 05/15/2018) 180 tablet 0  . aspirin 81 MG tablet Take 81 mg by mouth daily.      Marland Kitchen BIOTIN PO Take by mouth.    . busPIRone (BUSPAR) 30 MG tablet TAKE 1/3 TABLET TWICE DAILY X 1 WK, THEN 2/3 TABLET TWICE DAILY X 1 WK THEN 1 TABLET TWICE DAILY FOR ANXIETY. 180 tablet 0  . Cholecalciferol (VITAMIN D3) 3000 units TABS Take by mouth.    . Choline Fenofibrate 135 MG capsule Take 135 mg by mouth daily.      . DULoxetine (CYMBALTA) 60 MG capsule TAKE 1 CAPSULE BY MOUTH TWICE A DAY 180 capsule 0  . Ferrous Sulfate (IRON) 325 (65 Fe) MG TABS Take by mouth.    . levothyroxine (SYNTHROID, LEVOTHROID) 50 MCG tablet Take 50 mcg by mouth daily before breakfast.    . Magnesium 500 MG TABS Take by mouth.    . metFORMIN (GLUCOPHAGE-XR) 500 MG 24 hr tablet Take 1,000 mg by mouth 2 (two) times daily.  3  . Multiple Vitamin (MULTIVITAMIN) tablet Take 1 tablet by mouth daily.      . Omega-3 Fatty Acids (FISH OIL) 500 MG CAPS Take 1 capsule by mouth 2 (two) times daily.     Marland Kitchen omeprazole (PRILOSEC) 20 MG capsule TAKE 1 CAPSULE (20 MG TOTAL) BY MOUTH 2 (TWO) TIMES DAILY BEFORE A MEAL. 180 capsule 0  . ONE TOUCH ULTRA TEST test strip CHECK BLOOD SUGAR TWICE DAILY 100 each 3  . ONETOUCH DELICA LANCETS 36O MISC USE AS DIRECTED TWICE DAILY 200 each 3  . RESTASIS MULTIDOSE 0.05 %  ophthalmic emulsion INSTILL 1 DROP BY OPHTHALMIC ROUTE EVERY 12 HOURS INTO AFFECTED EYE(S)  6  . rosuvastatin (CRESTOR) 20 MG tablet Take 10 mg by mouth daily.     Marland Kitchen tiZANidine (ZANAFLEX) 2 MG tablet Take 1 tablet (2 mg total) by mouth 2 (two) times daily as needed. No additional refills until CPE in April 180 tablet 0  . valsartan-hydrochlorothiazide (DIOVAN-HCT) 320-25 MG tablet TAKE 1 TABLET BY MOUTH EVERY DAY 90 tablet 2  . Vitamin D, Ergocalciferol, (DRISDOL) 1.25 MG (50000 UT) CAPS capsule Take 1 capsule (50,000 Units total) by mouth every 7 (seven) days. 5 capsule 5  . warfarin (COUMADIN) 5 MG tablet TAKE AS DIRECTED BY  COUMADIN CLINIC. 90 tablet 1   No current facility-administered medications for this visit.     Medication Side Effects: None a little ankle swelling and increased appetitel  Allergies:  Allergies  Allergen Reactions  . Anaprox [Naproxen Sodium]   . Lipitor [Atorvastatin]   . Meperidine Hcl   . Morphine     Past Medical History:  Diagnosis Date  . Anemia   . Anxiety   . Breast cancer ([place])   . Depression   . Diabetes mellitus type II   . Fibromyalgia   . GERD (gastroesophageal reflux disease)   . Hyperlipidemia   . Hypertension   . Malignant neoplasm of breast (female), unspecified site 1993   L breast s/p mastectomy and tamoxifen x 13yrs  . 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 Unspecified hypothyroidism     Family History  Problem Relation Age of Onset  . Depression Other        Parent  . Arthritis Other        Parent, Grandparent  . Hypertension Other        Grandparent  . Hyperlipidemia Other        [place]  . Miscarriages / Stillbirths Other        Grandparent  . Stroke Other        [facility]  . Cancer Maternal Uncle        prostate  . Hypertension Maternal Grandfather   . Breast cancer Cousin   . Breast cancer Cousin     Social History   Socioeconomic History  . Marital status: Married    Spouse name: Not on file  . Number of children: Not on file  . Years of education: Not on file  . Highest education level: Not on file  Occupational History  . Occupation: Armed forces technical officer: RETIRED  . Occupation: Emergency planning/management officer  . Occupation: school bus driver  Social Needs  . Financial resource strain: Not on file  . Food insecurity:    Worry: Not on file    Inability: Not on file  . Transportation needs:    Medical: Not on file    Non-medical: Not on file  Tobacco Use  . Smoking status: Never Smoker  . Smokeless tobacco: Never Used  Substance and Sexual Activity  . Alcohol use: No     Alcohol/week: 0.0 standard drinks  . Drug use: No  . Sexual activity: Not on file  Lifestyle  . Physical activity:    Days per week: Not on file    Minutes per session: Not on file  . Stress: Not on file  Relationships  . Social connections:    Talks on phone: Not on file    Gets together: Not  on file    Attends religious service: Not on file    Active member of club or organization: Not on file    Attends meetings of clubs or organizations: Not on file    Relationship status: Not on file  . Intimate partner violence:    Fear of current or ex partner: Not on file    Emotionally abused: Not on file    Physically abused: Not on file    Forced sexual activity: Not on file  Other Topics Concern  . Not on file  Social History Narrative  . Not on file    Past Medical History, Surgical history, Social history, and Family history were reviewed and updated as appropriate.   Please see review of systems for further details on the patient's review from today.   Objective:   Physical Exam:  There were no vitals taken for this visit.  Physical Exam Constitutional:      General: She is not in acute distress.    Appearance: She is well-developed.  Musculoskeletal:        General: No deformity.  Neurological:     Mental Status: She is alert and oriented to person, place, and time.     Motor: No tremor.     Coordination: Coordination normal.     Gait: Gait normal.  Psychiatric:        Attention and Perception: Attention and perception normal. She is attentive.        Mood and Affect: Mood is anxious and depressed. Affect is not labile, blunt, angry or inappropriate.        Speech: Speech normal.        Behavior: Behavior normal.        Thought Content: Thought content normal. Thought content does not include homicidal or suicidal ideation. Thought content does not include homicidal or suicidal plan.        Cognition and Memory: Cognition normal.        Judgment: Judgment normal.      Comments: Insight intact. No auditory or visual hallucinations. No delusions.       Lab Review:     Component Value Date/Time   NA 137 11/20/2017 1553   NA 141 08/01/2016 1527   K 4.2 11/20/2017 1553   K 4.6 08/01/2016 1527   CL 103 11/20/2017 1553   CO2 25 11/20/2017 1553   CO2 24 08/01/2016 1527   GLUCOSE 99 11/20/2017 1553   GLUCOSE 93 08/01/2016 1527   BUN 23 11/20/2017 1553   BUN 19.9 08/01/2016 1527   CREATININE 1.00 11/20/2017 1553   CREATININE 0.9 08/01/2016 1527   CALCIUM 9.6 11/20/2017 1553   CALCIUM 9.7 08/01/2016 1527   PROT 6.8 10/03/2017 1122   PROT 7.2 08/01/2016 1527   PROT 6.8 08/01/2016 1527   ALBUMIN 4.4 10/03/2017 1122   ALBUMIN 4.0 08/01/2016 1527   AST 39 (H) 10/03/2017 1122   AST 41 (H) 08/01/2016 1527   ALT 21 10/03/2017 1122   ALT 25 08/01/2016 1527   ALKPHOS 32 (L) 10/03/2017 1122   ALKPHOS 48 08/01/2016 1527   BILITOT 0.6 10/03/2017 1122   BILITOT 0.33 08/01/2016 1527   GFRNONAA 84 (L) 09/01/2013 1638   GFRAA >90 09/01/2013 1638       Component Value Date/Time   WBC 5.1 11/20/2017 1553   RBC 3.72 (L) 11/20/2017 1553   HGB 11.2 (L) 11/20/2017 1553   HGB 9.5 (L) 08/01/2016 1527   HCT 33.3 (L) 11/20/2017 1553  HCT 31.2 (L) 08/01/2016 1527   PLT 201.0 11/20/2017 1553   PLT 221 08/01/2016 1527   MCV 89.6 11/20/2017 1553   MCV 83.9 08/01/2016 1527   MCH 25.5 08/01/2016 1527   MCH 29.4 09/01/2013 1638   MCHC 33.5 11/20/2017 1553   RDW 15.1 11/20/2017 1553   RDW 17.2 (H) 08/01/2016 1527   LYMPHSABS 1.7 11/20/2017 1553   LYMPHSABS 2.0 08/01/2016 1527   MONOABS 0.5 11/20/2017 1553   MONOABS 0.4 08/01/2016 1527   EOSABS 0.1 11/20/2017 1553   EOSABS 0.1 08/01/2016 1527   BASOSABS 0.0 11/20/2017 1553   BASOSABS 0.0 08/01/2016 1527    No results found for: POCLITH, LITHIUM   No results found for: PHENYTOIN, PHENOBARB, VALPROATE, CBMZ   Endo checked thyroid lately.   Vitamin D level on April 01, 2018 reported as 40.  After  that vitamin D 50,000 units weekly was prescribed with a goal of achieving levels in the 50s and 60s. .res Assessment: Plan:    Seasonal depression ([place])  Generalized anxiety disorder  Panic disorder with agoraphobia  Delayed sleep phase syndrome  Low vitamin D level   Greater than 50% of face to face time with patient was spent on counseling and coordination of care. We discussed several things.  Relapse depression even worse off the Abilify.  No benefit from Buspar.  DC Buspar.  DT seasonality, check vitamin D level.  TSH was checked. Vitamin D may help seasonal depression.  She has started 50,000 units weekly in January.  Option light therapy.   Options pramipexole, Rexulti, Vraylar, change to Trintellix, stimulant off label.  Disc SE and options.   Discussed potential metabolic side effects associated with atypical antipsychotics, as well as potential risk for movement side effects. Advised pt to contact office if movement side effects occur.     Vrayalar 1.5 samples QOD off label for depression.  It is likely faster and simpler than trying stimulants.  This appt was 30 mins.  FU 1 mos  Lynder Parents, MD, DFAPA   Please see After Visit Summary for patient specific instructions.  Future Appointments  Date Time Provider [place]  05/29/2018  1:00 PM [facility]AD[facility]  07/04/2018  2:00 PM [facility] HEALTH COACH [facility]  07/16/2018 10:00 AM [name], MD [facility]    No orders of the defined types were placed in this encounter.     -------------------------------

## 2018-05-29 ENCOUNTER — Ambulatory Visit: Payer: Medicare Other

## 2018-06-03 ENCOUNTER — Ambulatory Visit: Payer: Medicare Other | Admitting: General Practice

## 2018-06-03 ENCOUNTER — Other Ambulatory Visit: Payer: Self-pay

## 2018-06-03 DIAGNOSIS — I2699 Other pulmonary embolism without acute cor pulmonale: Secondary | ICD-10-CM

## 2018-06-03 DIAGNOSIS — Z7901 Long term (current) use of anticoagulants: Secondary | ICD-10-CM

## 2018-06-03 LAB — POCT INR: INR: 5.6 — AB (ref 2.0–3.0)

## 2018-06-03 NOTE — Patient Instructions (Addendum)
Pre visit review using our clinic review tool, if applicable. No additional management support is needed unless otherwise documented below in the visit note.  Hold coumadin today, tomorrow and Wednesday.  Take 1 tablet Thursday, Sat and Sun and take 1/2 tablet on Friday.  Re-check on Monday.  Re-check in 1 weeks.

## 2018-06-05 ENCOUNTER — Telehealth: Payer: Self-pay | Admitting: Psychiatry

## 2018-06-05 NOTE — Telephone Encounter (Signed)
I believe what Judson Roch is referencing is her Coumadin INR level. Please review

## 2018-06-05 NOTE — Telephone Encounter (Signed)
Patient called and said that she her cumin checked and it was 5.6. The only thing that has been added is the new medicine that you prescribed and she thinks the increase of one med and stopping another med is what is causing her cumin to be high. What do you think?

## 2018-06-06 NOTE — Telephone Encounter (Signed)
RTC   Increase INR.  Only med change was stopping busipirone and Abilify and starting Vrayalr.  No known interaction listed between Vrayalr and coumadin.  No benefit of Vrayalr but only had a few doses in 9 days.  Give it more time.  She agrees.  Lynder Parents, MD, DFAPA

## 2018-06-10 ENCOUNTER — Ambulatory Visit: Payer: Medicare Other | Admitting: General Practice

## 2018-06-10 ENCOUNTER — Other Ambulatory Visit: Payer: Self-pay

## 2018-06-10 DIAGNOSIS — Z7901 Long term (current) use of anticoagulants: Secondary | ICD-10-CM | POA: Diagnosis not present

## 2018-06-10 DIAGNOSIS — I2699 Other pulmonary embolism without acute cor pulmonale: Secondary | ICD-10-CM

## 2018-06-10 LAB — POCT INR: INR: 1.7 — AB (ref 2.0–3.0)

## 2018-06-10 LAB — HM DIABETES EYE EXAM

## 2018-06-10 NOTE — Patient Instructions (Signed)
Pre visit review using our clinic review tool, if applicable. No additional management support is needed unless otherwise documented below in the visit note.  Please take 1 tablet today and then change dosage and start taking 1/2 tablet daily except 1 tablet on Sundays.  Re-check in 3 weeks.

## 2018-07-01 ENCOUNTER — Ambulatory Visit: Payer: Medicare Other

## 2018-07-03 ENCOUNTER — Encounter: Payer: Medicare Other | Admitting: Psychiatry

## 2018-07-03 ENCOUNTER — Other Ambulatory Visit: Payer: Self-pay

## 2018-07-03 ENCOUNTER — Ambulatory Visit (INDEPENDENT_AMBULATORY_CARE_PROVIDER_SITE_OTHER): Payer: Medicare Other | Admitting: General Practice

## 2018-07-03 DIAGNOSIS — Z7901 Long term (current) use of anticoagulants: Secondary | ICD-10-CM

## 2018-07-03 DIAGNOSIS — I2699 Other pulmonary embolism without acute cor pulmonale: Secondary | ICD-10-CM

## 2018-07-03 LAB — POCT INR: INR: 2.6 (ref 2.0–3.0)

## 2018-07-03 NOTE — Patient Instructions (Addendum)
Pre visit review using our clinic review tool, if applicable. No additional management support is needed unless otherwise documented below in the visit note.  Continue to take 1/2 tablet daily except 1 tablet on Sundays.  Re-check in 4 weeks.

## 2018-07-03 NOTE — Progress Notes (Signed)
I have reviewed the results and agree with this plan   

## 2018-07-03 NOTE — Progress Notes (Signed)
Andrea Simmons [id number] 08-19-1940 78 y.o.  Subjective:   Patient ID:  Andrea Simmons is a 78 y.o. (DOB 1940/10/24) female.  Chief Complaint:  No chief complaint on file.   HPI  Andrea Simmons presents to the office today.    Last seen May 28, 2018.  She has had a recent relapse of depression and anxiety.  She felt she was losing benefit from Abilify and it was discontinued.  She also felt she was having weight gain with Abilify and therefore we picked an option with low weight gain risk.  She started buspirone.  That did not help.  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     Buspirone caused HA she thinks.  No benefit.  Going backwards in interest and motivation.  Wants to sit at home and mope.  Hopeful about spring.  Depressed.  Some anxiety but not real bad.  Sleeping a lot in daytime and laying around.   Was More talkative on the Abilify and the Wellbutrin.   Never a fan of fall and winter. History of seasonal depression.  Past Psychiatric Medication Trials: Rozerem, zolpidem. Duloxetine 120, Paxil 20 for years, Sertraline, fluoxetine, Wellbutrin, Abilify weight gain and loss benefit, lithium 2004 SE tremor at 600mg  daily. Low dose nortriptyline., Buspar NR.  Review of Systems:  Review of Systems  Constitutional: Positive for unexpected weight change.  Musculoskeletal: Positive for back pain and joint swelling. Negative for neck stiffness.  Neurological: Negative for tremors, weakness and numbness.  Psychiatric/Behavioral: Positive for dysphoric mood. Negative for agitation, behavioral problems, confusion, decreased concentration, hallucinations, self-injury, sleep disturbance and suicidal ideas. The patient is nervous/anxious. The patient is not hyperactive.    Hx anemia.    Cardiologist said she had a stiff heart.  Medications: I have reviewed the patient's current medications.  Current  Outpatient Medications  Medication Sig Dispense Refill  . aspirin 81 MG tablet Take 81 mg by mouth daily.      Marland Kitchen BIOTIN PO Take by mouth.    . busPIRone (BUSPAR) 30 MG tablet TAKE 1/3 TABLET TWICE DAILY X 1 WK, THEN 2/3 TABLET TWICE DAILY X 1 WK THEN 1 TABLET TWICE DAILY FOR ANXIETY. (Patient taking differently: No sig reported) 180 tablet 0  . Cholecalciferol (VITAMIN D3) 3000 units TABS Take by mouth.    . Choline Fenofibrate 135 MG capsule Take 135 mg by mouth daily.      . DULoxetine (CYMBALTA) 60 MG capsule TAKE 1 CAPSULE BY MOUTH TWICE A DAY 180 capsule 0  . Ferrous Sulfate (IRON) 325 (65 Fe) MG TABS Take by mouth.    . levothyroxine (SYNTHROID, LEVOTHROID) 50 MCG tablet Take 50 mcg by mouth daily before breakfast.    . Magnesium 500 MG TABS Take by mouth.    . metFORMIN (GLUCOPHAGE-XR) 500 MG 24 hr tablet Take 500 mg by mouth 2 (two) times daily.   3  . Multiple Vitamin (MULTIVITAMIN) tablet Take 1 tablet by mouth daily.      . Omega-3 Fatty Acids (FISH OIL) 500 MG CAPS Take 1 capsule by mouth 2 (two) times daily.     Marland Kitchen omeprazole (PRILOSEC) 20 MG capsule TAKE 1 CAPSULE (20 MG TOTAL) BY MOUTH 2 (TWO) TIMES DAILY BEFORE A MEAL. 180 capsule 0  . ONE TOUCH ULTRA TEST test strip CHECK BLOOD SUGAR TWICE DAILY 100 each 3  . ONETOUCH DELICA LANCETS 79K MISC  USE AS DIRECTED TWICE DAILY 200 each 3  . RESTASIS MULTIDOSE 0.05 % ophthalmic emulsion INSTILL 1 DROP BY OPHTHALMIC ROUTE EVERY 12 HOURS INTO AFFECTED EYE(S)  6  . rosuvastatin (CRESTOR) 20 MG tablet Take 10 mg by mouth daily.     Marland Kitchen tiZANidine (ZANAFLEX) 2 MG tablet Take 1 tablet (2 mg total) by mouth 2 (two) times daily as needed. No additional refills until CPE in April 180 tablet 0  . valsartan-hydrochlorothiazide (DIOVAN-HCT) 320-25 MG tablet TAKE 1 TABLET BY MOUTH EVERY DAY 90 tablet 2  . Vitamin D, Ergocalciferol, (DRISDOL) 1.25 MG (50000 UT) CAPS capsule Take 1 capsule (50,000 Units total) by mouth every 7 (seven) days. 5 capsule 5   . warfarin (COUMADIN) 5 MG tablet TAKE AS DIRECTED BY COUMADIN CLINIC. 90 tablet 1   No current facility-administered medications for this visit.     Medication Side Effects: None a little ankle swelling and increased appetitel  Allergies:  Allergies  Allergen Reactions  . Anaprox [Naproxen Sodium]   . Lipitor [Atorvastatin]   . Meperidine Hcl   . Morphine     Past Medical History:  Diagnosis Date  . Anemia   . Anxiety   . Breast cancer ([place])   . Depression   . Diabetes mellitus type II   . Fibromyalgia   . GERD (gastroesophageal reflux disease)   . Hyperlipidemia   . Hypertension   . Malignant neoplasm of breast (female), unspecified site 1993   L breast s/p mastectomy and tamoxifen x 66yrs  . 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 Unspecified hypothyroidism     Family History  Problem Relation Age of Onset  . Depression Other        Parent  . Arthritis Other        Parent, Grandparent  . Hypertension Other        Grandparent  . Hyperlipidemia Other        [place]  . Miscarriages / Stillbirths Other        Grandparent  . Stroke Other        [facility]  . Cancer Maternal Uncle        prostate  . Hypertension Maternal Grandfather   . Breast cancer Cousin   . Breast cancer Cousin     Social History   Socioeconomic History  . Marital status: Married    Spouse name: Not on file  . Number of children: Not on file  . Years of education: Not on file  . Highest education level: Not on file  Occupational History  . Occupation: Armed forces technical officer: RETIRED  . Occupation: Emergency planning/management officer  . Occupation: school bus driver  Social Needs  . Financial resource strain: Not on file  . Food insecurity:    Worry: Not on file    Inability: Not on file  . Transportation needs:    Medical: Not on file    Non-medical: Not on file  Tobacco Use  . Smoking status: Never Smoker  . Smokeless tobacco: Never Used  Substance  and Sexual Activity  . Alcohol use: No    Alcohol/week: 0.0 standard drinks  . Drug use: No  . Sexual activity: Not on file  Lifestyle  . Physical activity:    Days per week: Not on file    Minutes per session: Not on file  . Stress: Not on file  Relationships  . Social connections:  Talks on phone: Not on file    Gets together: Not on file    Attends religious service: Not on file    Active member of club or organization: Not on file    Attends meetings of clubs or organizations: Not on file    Relationship status: Not on file  . Intimate partner violence:    Fear of current or ex partner: Not on file    Emotionally abused: Not on file    Physically abused: Not on file    Forced sexual activity: Not on file  Other Topics Concern  . Not on file  Social History Narrative  . Not on file    Past Medical History, Surgical history, Social history, and Family history were reviewed and updated as appropriate.   Please see review of systems for further details on the patient's review from today.   Objective:   Physical Exam:  There were no vitals taken for this visit.  Physical Exam Constitutional:      General: She is not in acute distress.    Appearance: She is well-developed.  Musculoskeletal:        General: No deformity.  Neurological:     Mental Status: She is alert and oriented to person, place, and time.     Motor: No tremor.     Coordination: Coordination normal.     Gait: Gait normal.  Psychiatric:        Attention and Perception: Attention and perception normal. She is attentive.        Mood and Affect: Mood is anxious and depressed. Affect is not labile, blunt, angry or inappropriate.        Speech: Speech normal.        Behavior: Behavior normal.        Thought Content: Thought content normal. Thought content does not include homicidal or suicidal ideation. Thought content does not include homicidal or suicidal plan.        Cognition and Memory: Cognition  normal.        Judgment: Judgment normal.     Comments: Insight intact. No auditory or visual hallucinations. No delusions.       Lab Review:     Component Value Date/Time   NA 137 11/20/2017 1553   NA 141 08/01/2016 1527   K 4.2 11/20/2017 1553   K 4.6 08/01/2016 1527   CL 103 11/20/2017 1553   CO2 25 11/20/2017 1553   CO2 24 08/01/2016 1527   GLUCOSE 99 11/20/2017 1553   GLUCOSE 93 08/01/2016 1527   BUN 23 11/20/2017 1553   BUN 19.9 08/01/2016 1527   CREATININE 1.00 11/20/2017 1553   CREATININE 0.9 08/01/2016 1527   CALCIUM 9.6 11/20/2017 1553   CALCIUM 9.7 08/01/2016 1527   PROT 6.8 10/03/2017 1122   PROT 7.2 08/01/2016 1527   PROT 6.8 08/01/2016 1527   ALBUMIN 4.4 10/03/2017 1122   ALBUMIN 4.0 08/01/2016 1527   AST 39 (H) 10/03/2017 1122   AST 41 (H) 08/01/2016 1527   ALT 21 10/03/2017 1122   ALT 25 08/01/2016 1527   ALKPHOS 32 (L) 10/03/2017 1122   ALKPHOS 48 08/01/2016 1527   BILITOT 0.6 10/03/2017 1122   BILITOT 0.33 08/01/2016 1527   GFRNONAA 84 (L) 09/01/2013 1638   GFRAA >90 09/01/2013 1638       Component Value Date/Time   WBC 5.1 11/20/2017 1553   RBC 3.72 (L) 11/20/2017 1553   HGB 11.2 (L) 11/20/2017 1553   HGB  9.5 (L) 08/01/2016 1527   HCT 33.3 (L) 11/20/2017 1553   HCT 31.2 (L) 08/01/2016 1527   PLT 201.0 11/20/2017 1553   PLT 221 08/01/2016 1527   MCV 89.6 11/20/2017 1553   MCV 83.9 08/01/2016 1527   MCH 25.5 08/01/2016 1527   MCH 29.4 09/01/2013 1638   MCHC 33.5 11/20/2017 1553   RDW 15.1 11/20/2017 1553   RDW 17.2 (H) 08/01/2016 1527   LYMPHSABS 1.7 11/20/2017 1553   LYMPHSABS 2.0 08/01/2016 1527   MONOABS 0.5 11/20/2017 1553   MONOABS 0.4 08/01/2016 1527   EOSABS 0.1 11/20/2017 1553   EOSABS 0.1 08/01/2016 1527   BASOSABS 0.0 11/20/2017 1553   BASOSABS 0.0 08/01/2016 1527    No results found for: POCLITH, LITHIUM   No results found for: PHENYTOIN, PHENOBARB, VALPROATE, CBMZ   Endo checked thyroid lately.   Vitamin D level  on April 01, 2018 reported as 40.  After that vitamin D 50,000 units weekly was prescribed with a goal of achieving levels in the 50s and 60s. .res Assessment: Plan:    No diagnosis found.   Greater than 50% of face to face time with patient was spent on counseling and coordination of care. We discussed several things.  Relapse depression even worse off the Abilify.  No benefit from Buspar.  DC Buspar.  DT seasonality, check vitamin D level.  TSH was checked. Vitamin D may help seasonal depression.  She has started 50,000 units weekly in January.  Option light therapy.   Options pramipexole, Rexulti, Vraylar, change to Trintellix, stimulant off label.  Disc SE and options.   Discussed potential metabolic side effects associated with atypical antipsychotics, as well as potential risk for movement side effects. Advised pt to contact office if movement side effects occur.     Vrayalar 1.5 samples QOD off label for depression.  It is likely faster and simpler than trying stimulants.  This appt was 30 mins.  FU 1 mos  Lynder Parents, MD, DFAPA   Please see After Visit Summary for patient specific instructions.  Future Appointments  Date Time Provider [place]  07/04/2018  2:00 PM [name], MD [facility]  07/31/2018  1:30 PM [facility]  08/14/2018  1:30 PM [name], Reynold Bowen, MD [facility] None  10/17/2018  9:00 AM [name], MD [facility]    No orders of the defined types were placed in this encounter.     ------------------------------- This encounter was created in error - please disregard.

## 2018-07-04 ENCOUNTER — Ambulatory Visit (INDEPENDENT_AMBULATORY_CARE_PROVIDER_SITE_OTHER): Payer: Medicare Other | Admitting: Family Medicine

## 2018-07-04 ENCOUNTER — Telehealth: Payer: Self-pay | Admitting: Psychiatry

## 2018-07-04 ENCOUNTER — Ambulatory Visit: Payer: Medicare Other

## 2018-07-04 ENCOUNTER — Encounter: Payer: Self-pay | Admitting: Family Medicine

## 2018-07-04 ENCOUNTER — Other Ambulatory Visit: Payer: Self-pay

## 2018-07-04 ENCOUNTER — Other Ambulatory Visit: Payer: Self-pay | Admitting: Family Medicine

## 2018-07-04 DIAGNOSIS — E119 Type 2 diabetes mellitus without complications: Secondary | ICD-10-CM

## 2018-07-04 DIAGNOSIS — I10 Essential (primary) hypertension: Secondary | ICD-10-CM | POA: Diagnosis not present

## 2018-07-04 DIAGNOSIS — Z1231 Encounter for screening mammogram for malignant neoplasm of breast: Secondary | ICD-10-CM

## 2018-07-04 DIAGNOSIS — E039 Hypothyroidism, unspecified: Secondary | ICD-10-CM

## 2018-07-04 DIAGNOSIS — I2699 Other pulmonary embolism without acute cor pulmonale: Secondary | ICD-10-CM

## 2018-07-04 DIAGNOSIS — E785 Hyperlipidemia, unspecified: Secondary | ICD-10-CM

## 2018-07-04 NOTE — Telephone Encounter (Signed)
Patient has been taking the samples of vraylar and doesn't see any difference at all. She will run out on Saturday. Her next appt is tues 4/21. What do you want to do? Prescribe something different or waht until her appointment

## 2018-07-04 NOTE — Telephone Encounter (Signed)
Okay to just stop the Vraylar.  We will discuss the use of stimulants for depression when I see here next week.

## 2018-07-04 NOTE — Progress Notes (Signed)
Virtual Visit via Video   I connected with Andrea Simmons on 07/04/18 at  2:00 PM EDT by a video enabled telemedicine application and verified that I am speaking with the correct person using two identifiers. Location patient: Home Location provider: Acupuncturist, Office Persons participating in the virtual visit: pt and myself  I discussed the limitations of evaluation and management by telemedicine and the availability of in person appointments. The patient expressed understanding and agreed to proceed.  Interactive audio and video telecommunications were attempted between this provider and patient, however failed, due to patient having technical difficulties OR patient did not have access to video capability.  We continued and completed visit with audio only.   Subjective:   HPI:  Here today for MWV.  Risk Factors: HTN- chronic problem, on Valsartan HCTZ 320/25mg  daily.  Denies CP, SOB, HAs, visual changes, edema. Hyperlipidemia- chronic problem, on Crestor 20mg  daily.  Denies abd pain, N/V. DM- chronic problem, on Metformin 500mg  BID. On ARB for renal protection.  UTD on foot exam.  Due for eye exam- pt reports this is scheduled. Denies symptomatic lows.  No numbness/tingling of hands/feet reported. Hypothyroid- chronic problem, on Levothyroxine 18mcg daily, following w/ Dr Chalmers Cater.  + fatigue. Recurrent PE- on long term anticoagulation w/ Coumadin.  Denies SOB. Physical Activity: no formal exercise but does housework and yardwork Fall Risk: moderate- she feels she has had increased stumbling since starting Buspar per psych Depression: chronic problem, following w/ Dr Clovis Pu.  On Cymbalta Hearing: normal to conversational tones, decreased to whispered voice ADL's: indpendent Cognitive: Tangential thought process.  Concentration WNL. Home Safety: safe at home, lives w/ husband Height, Weight, BMI, Visual Acuity: see vitals, vision corrected to 20/20 w/ glasses Counseling:  UTD on immunizations.  Due for eye exam.  UTD on mammo.  Declines colon cancer screen Labs Ordered: See A&P Care Plan: See A&P   ROS: See pertinent positives and negatives per HPI.  Patient Active Problem List   Diagnosis Date Noted  . Hx of pulmonary embolus 03/30/2017  . Physical exam 11/30/2016  . Hyperlipidemia 12/26/2013  . Encounter for therapeutic drug monitoring 04/16/2013  . 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 Long term (current) use of anticoagulants 06/30/2010  . DOE (dyspnea on exertion) 11/09/2008  . Diabetes type 2, controlled ([place]) 08/27/2008  . ANEMIA-NOS 08/27/2008  . Seasonal and perennial allergic rhinitis 08/27/2008  . PULMONARY NODULE 08/27/2008  . Fibromyalgia 08/27/2008  . ADENOCARCINOMA, BREAST 03/20/2007  . Hypothyroidism 03/20/2007  . Essential hypertension 03/20/2007  . Recurrent pulmonary emboli ([place]) 03/20/2007  . GERD 03/20/2007  . ESOPHAGEAL STRICTURE 03/08/2007    Social History   Tobacco Use  . Smoking status: Never Smoker  . Smokeless tobacco: Never Used  Substance Use Topics  . Alcohol use: No    Alcohol/week: 0.0 standard drinks    Current Outpatient Medications:  .  aspirin 81 MG tablet, Take 81 mg by mouth daily.  , Disp: , Rfl:  .  BIOTIN PO, Take by mouth., Disp: , Rfl:  .  Cholecalciferol (VITAMIN D3) 3000 units TABS, Take by mouth., Disp: , Rfl:  .  Choline Fenofibrate 135 MG capsule, Take 135 mg by mouth daily.  , Disp: , Rfl:  .  DULoxetine (CYMBALTA) 60 MG capsule, TAKE 1 CAPSULE BY MOUTH TWICE A DAY, Disp: 180 capsule, Rfl: 0 .  Ferrous Sulfate (IRON) 325 (65 Fe) MG TABS, Take by mouth., Disp: , Rfl:  .  levothyroxine (SYNTHROID, LEVOTHROID) 50 MCG tablet, Take 50 mcg by mouth daily before breakfast., Disp: , Rfl:  .  Magnesium 500 MG TABS, Take by mouth., Disp: , Rfl:  .  metFORMIN (GLUCOPHAGE-XR) 500 MG 24 hr tablet, Take 500 mg by mouth 2 (two) times daily. , Disp: , Rfl: 3 .   Multiple Vitamin (MULTIVITAMIN) tablet, Take 1 tablet by mouth daily.  , Disp: , Rfl:  .  Omega-3 Fatty Acids (FISH OIL) 500 MG CAPS, Take 1 capsule by mouth 2 (two) times daily. , Disp: , Rfl:  .  omeprazole (PRILOSEC) 20 MG capsule, TAKE 1 CAPSULE (20 MG TOTAL) BY MOUTH 2 (TWO) TIMES DAILY BEFORE A MEAL., Disp: 180 capsule, Rfl: 0 .  ONE TOUCH ULTRA TEST test strip, CHECK BLOOD SUGAR TWICE DAILY, Disp: 100 each, Rfl: 3 .  ONETOUCH DELICA LANCETS 59Y MISC, USE AS DIRECTED TWICE DAILY, Disp: 200 each, Rfl: 3 .  RESTASIS MULTIDOSE 0.05 % ophthalmic emulsion, INSTILL 1 DROP BY OPHTHALMIC ROUTE EVERY 12 HOURS INTO AFFECTED EYE(S), Disp: , Rfl: 6 .  rosuvastatin (CRESTOR) 20 MG tablet, Take 10 mg by mouth daily. , Disp: , Rfl:  .  tiZANidine (ZANAFLEX) 2 MG tablet, Take 1 tablet (2 mg total) by mouth 2 (two) times daily as needed. No additional refills until CPE in April, Disp: 180 tablet, Rfl: 0 .  valsartan-hydrochlorothiazide (DIOVAN-HCT) 320-25 MG tablet, TAKE 1 TABLET BY MOUTH EVERY DAY, Disp: 90 tablet, Rfl: 2 .  Vitamin D, Ergocalciferol, (DRISDOL) 1.25 MG (50000 UT) CAPS capsule, Take 1 capsule (50,000 Units total) by mouth every 7 (seven) days., Disp: 5 capsule, Rfl: 5 .  warfarin (COUMADIN) 5 MG tablet, TAKE AS DIRECTED BY COUMADIN CLINIC., Disp: 90 tablet, Rfl: 1 .  busPIRone (BUSPAR) 30 MG tablet, TAKE 1/3 TABLET TWICE DAILY X 1 WK, THEN 2/3 TABLET TWICE DAILY X 1 WK THEN 1 TABLET TWICE DAILY FOR ANXIETY. (Patient not taking: Reported on 07/04/2018), Disp: 180 tablet, Rfl: 0  Allergies  Allergen Reactions  . Anaprox [Naproxen Sodium]   . Lipitor [Atorvastatin]   . Meperidine Hcl   . Morphine     Objective:   There were no vitals taken for this visit. Pt is able to speak clearly, coherently without shortness of breath or increased work of breathing.  Thought process is tangential.  Mood is appropriate.   Assessment and Plan:   MWV- pt is UTD on immunizations, plans to schedule  mammo, declines colonoscopy at this time.  Anticipatory guidance provided.   DM- chronic problem.  Typically follows w/ Dr Chalmers Cater but cannot recall when she last had labs done.  Will get labs and forward to Dr Chalmers Cater.  Pt encouraged to schedule eye exam.  HTN- not able to check BP at home but we will check when she comes for labs.  No anticipated med changes at this time  Hyperlipidemia- chronic problem.  Tolerating statin w/o difficulty.  Check labs.  Adjust meds prn   Hypothyroid- ongoing fatigue.  Check labs.  Adjust meds prn   Recurrent PEs- on long term anticoagulation.  Currently asymptomatic.   Annye Asa, MD 07/04/2018

## 2018-07-05 ENCOUNTER — Other Ambulatory Visit (INDEPENDENT_AMBULATORY_CARE_PROVIDER_SITE_OTHER): Payer: Medicare Other

## 2018-07-05 ENCOUNTER — Other Ambulatory Visit: Payer: Medicare Other

## 2018-07-05 DIAGNOSIS — E119 Type 2 diabetes mellitus without complications: Secondary | ICD-10-CM | POA: Diagnosis not present

## 2018-07-05 DIAGNOSIS — E039 Hypothyroidism, unspecified: Secondary | ICD-10-CM

## 2018-07-05 DIAGNOSIS — E785 Hyperlipidemia, unspecified: Secondary | ICD-10-CM | POA: Diagnosis not present

## 2018-07-05 DIAGNOSIS — I10 Essential (primary) hypertension: Secondary | ICD-10-CM

## 2018-07-05 LAB — CBC WITH DIFFERENTIAL/PLATELET
Basophils Absolute: 0 10*3/uL (ref 0.0–0.1)
Basophils Relative: 0.6 % (ref 0.0–3.0)
Eosinophils Absolute: 0.1 10*3/uL (ref 0.0–0.7)
Eosinophils Relative: 1.7 % (ref 0.0–5.0)
HCT: 32.3 % — ABNORMAL LOW (ref 36.0–46.0)
Hemoglobin: 11.1 g/dL — ABNORMAL LOW (ref 12.0–15.0)
Lymphocytes Relative: 32.3 % (ref 12.0–46.0)
Lymphs Abs: 1.5 10*3/uL (ref 0.7–4.0)
MCHC: 34.2 g/dL (ref 30.0–36.0)
MCV: 90 fl (ref 78.0–100.0)
Monocytes Absolute: 0.4 10*3/uL (ref 0.1–1.0)
Monocytes Relative: 8.5 % (ref 3.0–12.0)
Neutro Abs: 2.6 10*3/uL (ref 1.4–7.7)
Neutrophils Relative %: 56.9 % (ref 43.0–77.0)
Platelets: 197 10*3/uL (ref 150.0–400.0)
RBC: 3.59 Mil/uL — ABNORMAL LOW (ref 3.87–5.11)
RDW: 14.2 % (ref 11.5–15.5)
WBC: 4.5 10*3/uL (ref 4.0–10.5)

## 2018-07-05 LAB — HEPATIC FUNCTION PANEL
ALT: 19 U/L (ref 0–35)
AST: 32 U/L (ref 0–37)
Albumin: 4.2 g/dL (ref 3.5–5.2)
Alkaline Phosphatase: 37 U/L — ABNORMAL LOW (ref 39–117)
Bilirubin, Direct: 0.1 mg/dL (ref 0.0–0.3)
Total Bilirubin: 0.5 mg/dL (ref 0.2–1.2)
Total Protein: 6.4 g/dL (ref 6.0–8.3)

## 2018-07-05 LAB — BASIC METABOLIC PANEL
BUN: 15 mg/dL (ref 6–23)
CO2: 26 mEq/L (ref 19–32)
Calcium: 9.2 mg/dL (ref 8.4–10.5)
Chloride: 107 mEq/L (ref 96–112)
Creatinine, Ser: 0.96 mg/dL (ref 0.40–1.20)
GFR: 56.47 mL/min — ABNORMAL LOW (ref >60.00)
Glucose, Bld: 105 mg/dL — ABNORMAL HIGH (ref 70–99)
Potassium: 4 mEq/L (ref 3.5–5.1)
Sodium: 143 mEq/L (ref 135–145)

## 2018-07-05 LAB — LIPID PANEL
Cholesterol: 133 mg/dL (ref 0–200)
HDL: 30.1 mg/dL — ABNORMAL LOW (ref >39.00)
LDL Cholesterol: 77 mg/dL (ref 0–99)
NonHDL: 103.2
Total CHOL/HDL Ratio: 4
Triglycerides: 133 mg/dL (ref 0.0–149.0)
VLDL: 26.6 mg/dL (ref 0.0–40.0)

## 2018-07-05 LAB — TSH: TSH: 1.84 u[IU]/mL (ref 0.35–4.50)

## 2018-07-05 LAB — HEMOGLOBIN A1C: Hgb A1c MFr Bld: 6.5 % (ref 4.6–6.5)

## 2018-07-05 NOTE — Telephone Encounter (Signed)
Called and made pt. Aware. She verbalized understanding.

## 2018-07-09 ENCOUNTER — Other Ambulatory Visit: Payer: Self-pay

## 2018-07-09 ENCOUNTER — Encounter: Payer: Self-pay | Admitting: Psychiatry

## 2018-07-09 ENCOUNTER — Ambulatory Visit (INDEPENDENT_AMBULATORY_CARE_PROVIDER_SITE_OTHER): Payer: Medicare Other | Admitting: Psychiatry

## 2018-07-09 DIAGNOSIS — F338 Other recurrent depressive disorders: Secondary | ICD-10-CM | POA: Diagnosis not present

## 2018-07-09 DIAGNOSIS — F331 Major depressive disorder, recurrent, moderate: Secondary | ICD-10-CM | POA: Diagnosis not present

## 2018-07-09 DIAGNOSIS — G4721 Circadian rhythm sleep disorder, delayed sleep phase type: Secondary | ICD-10-CM

## 2018-07-09 DIAGNOSIS — F4001 Agoraphobia with panic disorder: Secondary | ICD-10-CM

## 2018-07-09 DIAGNOSIS — R7989 Other specified abnormal findings of blood chemistry: Secondary | ICD-10-CM

## 2018-07-09 DIAGNOSIS — F411 Generalized anxiety disorder: Secondary | ICD-10-CM

## 2018-07-09 MED ORDER — METHYLPHENIDATE HCL 10 MG PO TABS: ORAL_TABLET | ORAL | 0 refills | Status: DC

## 2018-07-09 NOTE — Progress Notes (Signed)
Andrea Simmons [id number] Jun 12, 1940 78 y.o.  Subjective:   Patient ID:  Andrea Simmons is a 78 y.o. (DOB [birth year]) female.  Chief Complaint:  Chief Complaint  Patient presents with  . Follow-up    Medication Management  . Fatigue    Tired   . Depression    Loss of interest in things    Depression         Associated symptoms include no decreased concentration and no suicidal ideas.   Andrea Simmons presents to the office today.    Last seen May 28, 2018 complaining of depression.  She had lost the benefit of Abilify and stopped it.  We started Vraylar in its place which she took at 1.5 mg every other day for 2 to 3 weeks and then daily for a couple of weeks with no response and she has stopped it as well..  She has had a recent relapse of depression and anxiety.  She felt she was losing benefit from Abilify and it was discontinued.    Going backwards in interest and motivation.  Wants to sit at home and mope.  Hopeful about spring.  Depressed.  Some anxiety but not real bad.  Sleeping a lot in daytime and laying around.  Same as before.  Poor work Nurse, learning disability. Don't care about things.    Was More talkative on the Abilify and the Wellbutrin.   History of seasonal depression.  Past Psychiatric Medication Trials: Rozerem, zolpidem. Duloxetine 120, Paxil 20 for years, Sertraline, fluoxetine, Wellbutrin, Abilify weight gain and loss benefit, lithium 2004 SE tremor at 600mg  daily. Low dose nortriptyline, Vraylar NR, Buspar NR.  Review of Systems:  Review of Systems  Constitutional: Positive for unexpected weight change.  Musculoskeletal: Positive for back pain and joint swelling. Negative for neck stiffness.  Neurological: Negative for tremors, weakness and numbness.  Psychiatric/Behavioral: Positive for depression and dysphoric mood. Negative for agitation, behavioral problems, confusion, decreased concentration, hallucinations, self-injury, sleep disturbance and suicidal ideas.  The patient is nervous/anxious. The patient is not hyperactive.    Hx anemia.    Cardiologist said she had a stiff heart.  History of pulmonary embolism and on Coumadin.  Medications: I have reviewed the patient's current medications.  Current Outpatient Medications  Medication Sig Dispense Refill  . aspirin 81 MG tablet Take 81 mg by mouth daily.      Marland Kitchen BIOTIN PO Take by mouth.    . Cholecalciferol (VITAMIN D3) 3000 units TABS Take by mouth.    . Choline Fenofibrate 135 MG capsule Take 135 mg by mouth daily.      . DULoxetine (CYMBALTA) 60 MG capsule TAKE 1 CAPSULE BY MOUTH TWICE A DAY 180 capsule 0  . Ferrous Sulfate (IRON) 325 (65 Fe) MG TABS Take by mouth.    . furosemide (LASIX) 20 MG tablet TAKE 1 (ONE) TABLET ONCE DAILY AS NEEDED    . levothyroxine (SYNTHROID, LEVOTHROID) 50 MCG tablet Take 50 mcg by mouth daily before breakfast.    . Magnesium 500 MG TABS Take by mouth.    . metFORMIN (GLUCOPHAGE-XR) 500 MG 24 hr tablet Take 500 mg by mouth 2 (two) times daily.   3  . Multiple Vitamin (MULTIVITAMIN) tablet Take 1 tablet by mouth daily.      . Omega-3 Fatty Acids (FISH OIL) 500 MG CAPS Take 1 capsule by mouth 2 (two) times daily.     Marland Kitchen omeprazole (PRILOSEC) 20 MG capsule TAKE 1 CAPSULE (20 MG TOTAL) BY  MOUTH 2 (TWO) TIMES DAILY BEFORE A MEAL. 180 capsule 0  . ONE TOUCH ULTRA TEST test strip CHECK BLOOD SUGAR TWICE DAILY 100 each 3  . ONETOUCH DELICA LANCETS 72I MISC USE AS DIRECTED TWICE DAILY 200 each 3  . RESTASIS MULTIDOSE 0.05 % ophthalmic emulsion INSTILL 1 DROP BY OPHTHALMIC ROUTE EVERY 12 HOURS INTO AFFECTED EYE(S)  6  . rosuvastatin (CRESTOR) 20 MG tablet Take 10 mg by mouth daily.     Marland Kitchen tiZANidine (ZANAFLEX) 2 MG tablet Take 1 tablet (2 mg total) by mouth 2 (two) times daily as needed. No additional refills until CPE in April 180 tablet 0  . valsartan-hydrochlorothiazide (DIOVAN-HCT) 320-25 MG tablet TAKE 1 TABLET BY MOUTH EVERY DAY 90 tablet 2  . Vitamin D,  Ergocalciferol, (DRISDOL) 1.25 MG (50000 UT) CAPS capsule Take 1 capsule (50,000 Units total) by mouth every 7 (seven) days. 5 capsule 5  . warfarin (COUMADIN) 5 MG tablet TAKE AS DIRECTED BY COUMADIN CLINIC. 90 tablet 1   No current facility-administered medications for this visit.     Medication Side Effects: None a little ankle swelling and increased appetitel  Allergies:  Allergies  Allergen Reactions  . Anaprox [Naproxen Sodium]   . Lipitor [Atorvastatin]   . Meperidine Hcl   . Morphine     Past Medical History:  Diagnosis Date  . Anemia   . Anxiety   . Breast cancer ([place])   . Depression   . Diabetes mellitus type II   . Fibromyalgia   . GERD (gastroesophageal reflux disease)   . Hyperlipidemia   . Hypertension   . Malignant neoplasm of breast (female), unspecified site 1993   L breast s/p mastectomy and tamoxifen x 68yrs  . 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 Unspecified hypothyroidism     Family History  Problem Relation Age of Onset  . Depression Other        Parent  . Arthritis Other        Parent, Grandparent  . Hypertension Other        Grandparent  . Hyperlipidemia Other        [place]  . Miscarriages / Stillbirths Other        Grandparent  . Stroke Other        [facility]  . Cancer Maternal Uncle        prostate  . Hypertension Maternal Grandfather   . Breast cancer Cousin   . Breast cancer Cousin     Social History   Socioeconomic History  . Marital status: Married    Spouse name: Not on file  . Number of children: Not on file  . Years of education: Not on file  . Highest education level: Not on file  Occupational History  . Occupation: Armed forces technical officer: RETIRED  . Occupation: Emergency planning/management officer  . Occupation: school bus driver  Social Needs  . Financial resource strain: Not on file  . Food insecurity:    Worry: Not on file    Inability: Not on file  . Transportation needs:     Medical: Not on file    Non-medical: Not on file  Tobacco Use  . Smoking status: Never Smoker  . Smokeless tobacco: Never Used  Substance and Sexual Activity  . Alcohol use: No    Alcohol/week: 0.0 standard drinks  . Drug use: No  . Sexual activity: Not on file  Lifestyle  .  Physical activity:    Days per week: Not on file    Minutes per session: Not on file  . Stress: Not on file  Relationships  . Social connections:    Talks on phone: Not on file    Gets together: Not on file    Attends religious service: Not on file    Active member of club or organization: Not on file    Attends meetings of clubs or organizations: Not on file    Relationship status: Not on file  . Intimate partner violence:    Fear of current or ex partner: Not on file    Emotionally abused: Not on file    Physically abused: Not on file    Forced sexual activity: Not on file  Other Topics Concern  . Not on file  Social History Narrative  . Not on file    Past Medical History, Surgical history, Social history, and Family history were reviewed and updated as appropriate.   Please see review of systems for further details on the patient's review from today.   Objective:   Physical Exam:  There were no vitals taken for this visit.  Physical Exam Constitutional:      General: She is not in acute distress.    Appearance: She is well-developed.  Musculoskeletal:        General: No deformity.  Neurological:     Mental Status: She is alert and oriented to person, place, and time.     Motor: No tremor.     Coordination: Coordination normal.     Gait: Gait normal.  Psychiatric:        Attention and Perception: Attention and perception normal. She is attentive.        Mood and Affect: Mood is anxious and depressed. Affect is not labile, blunt, angry or inappropriate.        Speech: Speech normal.        Behavior: Behavior normal.        Thought Content: Thought content normal. Thought content does  not include homicidal or suicidal ideation. Thought content does not include homicidal or suicidal plan.        Cognition and Memory: Cognition normal.        Judgment: Judgment normal.     Comments: Insight intact. No auditory or visual hallucinations. No delusions.       Lab Review:     Component Value Date/Time   NA 143 07/05/2018 0949   NA 141 08/01/2016 1527   K 4.0 07/05/2018 0949   K 4.6 08/01/2016 1527   CL 107 07/05/2018 0949   CO2 26 07/05/2018 0949   CO2 24 08/01/2016 1527   GLUCOSE 105 (H) 07/05/2018 0949   GLUCOSE 93 08/01/2016 1527   BUN 15 07/05/2018 0949   BUN 19.9 08/01/2016 1527   CREATININE 0.96 07/05/2018 0949   CREATININE 0.9 08/01/2016 1527   CALCIUM 9.2 07/05/2018 0949   CALCIUM 9.7 08/01/2016 1527   PROT 6.4 07/05/2018 0949   PROT 7.2 08/01/2016 1527   PROT 6.8 08/01/2016 1527   ALBUMIN 4.2 07/05/2018 0949   ALBUMIN 4.0 08/01/2016 1527   AST 32 07/05/2018 0949   AST 41 (H) 08/01/2016 1527   ALT 19 07/05/2018 0949   ALT 25 08/01/2016 1527   ALKPHOS 37 (L) 07/05/2018 0949   ALKPHOS 48 08/01/2016 1527   BILITOT 0.5 07/05/2018 0949   BILITOT 0.33 08/01/2016 1527   GFRNONAA 84 (L) 09/01/2013 1638   GFRAA >  90 09/01/2013 1638       Component Value Date/Time   WBC 4.5 07/05/2018 0949   RBC 3.59 (L) 07/05/2018 0949   HGB 11.1 (L) 07/05/2018 0949   HGB 9.5 (L) 08/01/2016 1527   HCT 32.3 (L) 07/05/2018 0949   HCT 31.2 (L) 08/01/2016 1527   PLT 197.0 07/05/2018 0949   PLT 221 08/01/2016 1527   MCV 90.0 07/05/2018 0949   MCV 83.9 08/01/2016 1527   MCH 25.5 08/01/2016 1527   MCH 29.4 09/01/2013 1638   MCHC 34.2 07/05/2018 0949   RDW 14.2 07/05/2018 0949   RDW 17.2 (H) 08/01/2016 1527   LYMPHSABS 1.5 07/05/2018 0949   LYMPHSABS 2.0 08/01/2016 1527   MONOABS 0.4 07/05/2018 0949   MONOABS 0.4 08/01/2016 1527   EOSABS 0.1 07/05/2018 0949   EOSABS 0.1 08/01/2016 1527   BASOSABS 0.0 07/05/2018 0949   BASOSABS 0.0 08/01/2016 1527    No results  found for: POCLITH, LITHIUM   No results found for: PHENYTOIN, PHENOBARB, VALPROATE, CBMZ   Endo checked thyroid lately.   Vitamin D level on April 01, 2018 reported as 40.  After that vitamin D 50,000 units weekly was prescribed with a goal of achieving levels in the 50s and 60s. .res Assessment: Plan:    Major depressive disorder, recurrent episode, moderate (HCC)  Seasonal depression (HCC)  Generalized anxiety disorder  Panic disorder with agoraphobia  Delayed sleep phase syndrome  Low vitamin D level   Greater than 50% of face to face time with patient was spent on counseling and coordination of care. We discussed several things.  Relapse depression even worse off the Abilify.  Recent failures of Vraylar and Buspirone.  DC Vraylar DT NR.  DT seasonality, check vitamin D level.  TSH was checked. Vitamin D may help seasonal depression.  She has started 50,000 units weekly in January.  Option light therapy.  She doesn't think she'd comply.   Options pramipexole, Rexulti,  change to Trintellix, stimulant off label.  Disc SE and options.   Discussed potential metabolic side effects associated with atypical antipsychotics, as well as potential risk for movement side effects. Advised pt to contact office if movement side effects occur.     Hesitate to change duloxetine bc likely to affect INR levels.  Instead trial Ritalin 5 BID, then 10 BID.  Disc SE including anxiety.  Discussed potential benefits, risks, and side effects of stimulants with patient to include increased heart rate, palpitations, insomnia, increased anxiety, increased irritability, or decreased appetite.  Instructed patient to contact office if experiencing any significant tolerability issues.  Disc risk of INR effects.  This appt was 30 mins.  FU 1 mos  Lynder Parents, MD, DFAPA   Please see After Visit Summary for patient specific instructions.  Future Appointments  Date Time Provider [place]   07/31/2018  1:30 PM [facility]  08/14/2018  1:30 PM [name], Reynold Bowen, MD [facility] None  09/06/2018  1:40 PM [facility] MM 3 [facility]  10/17/2018  9:00 AM [name], MD [facility]    No orders of the defined types were placed in this encounter.     -------------------------------

## 2018-07-11 ENCOUNTER — Other Ambulatory Visit: Payer: Self-pay | Admitting: Family Medicine

## 2018-07-16 ENCOUNTER — Encounter: Payer: Medicare Other | Admitting: Family Medicine

## 2018-07-30 ENCOUNTER — Other Ambulatory Visit: Payer: Self-pay | Admitting: Psychiatry

## 2018-07-31 ENCOUNTER — Ambulatory Visit (INDEPENDENT_AMBULATORY_CARE_PROVIDER_SITE_OTHER): Payer: Medicare Other | Admitting: General Practice

## 2018-07-31 ENCOUNTER — Other Ambulatory Visit: Payer: Self-pay

## 2018-07-31 DIAGNOSIS — Z7901 Long term (current) use of anticoagulants: Secondary | ICD-10-CM

## 2018-07-31 DIAGNOSIS — I2699 Other pulmonary embolism without acute cor pulmonale: Secondary | ICD-10-CM

## 2018-07-31 LAB — POCT INR: INR: 1.6 — AB (ref 2.0–3.0)

## 2018-07-31 NOTE — Patient Instructions (Signed)
Pre visit review using our clinic review tool, if applicable. No additional management support is needed unless otherwise documented below in the visit note.  Take 1 tablet today and tomorrow (5/13 and 5/14) and then continue to take 1/2 tablet daily except 1 tablet on Sundays.  Re-check in 3 weeks.

## 2018-07-31 NOTE — Progress Notes (Signed)
I have reviewed the results and agree with this plan   

## 2018-08-13 ENCOUNTER — Other Ambulatory Visit: Payer: Self-pay

## 2018-08-13 MED ORDER — METHYLPHENIDATE HCL 10 MG PO TABS: ORAL_TABLET | ORAL | 0 refills | Status: DC

## 2018-08-14 ENCOUNTER — Ambulatory Visit: Payer: Self-pay | Admitting: Cardiology

## 2018-08-14 NOTE — Progress Notes (Deleted)
Telephone visit note  Subjective:   Andrea Simmons, female    DOB: [birth year], 78 y.o.   MRN: [id number]   I connected with the patient on 08/14/18 by a telephone call and verified that I am speaking with the correct person using two identifiers.     I offered the patient a video enabled application for a virtual visit. Unfortunately, this could not be accomplished due to technical difficulties/lack of video enabled phone/computer. I discussed the limitations of evaluation and management by telemedicine and the availability of in person appointments. The patient expressed understanding and agreed to proceed.   This visit type was conducted due to national recommendations for restrictions regarding the COVID-19 Pandemic (e.g. social distancing).  This format is felt to be most appropriate for this patient at this time.  All issues noted in this document were discussed and addressed.  No physical exam was performed (except for noted visual exam findings with Tele health visits).  The patient has consented to conduct a Tele health visit and understands insurance will be billed.   Chief complaint:  ***  *** HPI  78 y.o. *** female with ***  *** Past Medical History:  Diagnosis Date  . Anemia   . Anxiety   . Breast cancer ([place])   . Depression   . Diabetes mellitus type II   . Fibromyalgia   . GERD (gastroesophageal reflux disease)   . Hyperlipidemia   . Hypertension   . Malignant neoplasm of breast (female), unspecified site 1993   L breast s/p mastectomy and tamoxifen x 21yrs  . 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 Unspecified hypothyroidism     *** Past Surgical History:  Procedure Laterality Date  . APPENDECTOMY    . BREAST BIOPSY Left 1993  . BREAST BIOPSY Right 09/25/2014   stero. Benign  . BREAST RECONSTRUCTION Left   . CATARACT EXTRACTION    . MASTECTOMY Left   . ROTATOR CUFF REPAIR    . SPINAL FUSION      x 2  .  TOTAL ABDOMINAL HYSTERECTOMY W/ BILATERAL SALPINGOOPHORECTOMY    . TUBAL LIGATION      *** Social History   Socioeconomic History  . Marital status: Married    Spouse name: Not on file  . Number of children: Not on file  . Years of education: Not on file  . Highest education level: Not on file  Occupational History  . Occupation: Armed forces technical officer: RETIRED  . Occupation: Emergency planning/management officer  . Occupation: school bus driver  Social Needs  . Financial resource strain: Not on file  . Food insecurity:    Worry: Not on file    Inability: Not on file  . Transportation needs:    Medical: Not on file    Non-medical: Not on file  Tobacco Use  . Smoking status: Never Smoker  . Smokeless tobacco: Never Used  Substance and Sexual Activity  . Alcohol use: No    Alcohol/week: 0.0 standard drinks  . Drug use: No  . Sexual activity: Not on file  Lifestyle  . Physical activity:    Days per week: Not on file    Minutes per session: Not on file  . Stress: Not on file  Relationships  . Social connections:    Talks on phone: Not on file    Gets together: Not on file    Attends religious service: Not on  file    Active member of club or organization: Not on file    Attends meetings of clubs or organizations: Not on file    Relationship status: Not on file  . Intimate partner violence:    Fear of current or ex partner: Not on file    Emotionally abused: Not on file    Physically abused: Not on file    Forced sexual activity: Not on file  Other Topics Concern  . Not on file  Social History Narrative  . Not on file    *** Family History  Problem Relation Age of Onset  . Depression Other        Parent  . Arthritis Other        Parent, Grandparent  . Hypertension Other        Grandparent  . Hyperlipidemia Other        [place]  . Miscarriages / Stillbirths Other        Grandparent  . Stroke Other        [facility] [place]  . Cancer Maternal Uncle        prostate  . Hypertension  Maternal Grandfather   . Breast cancer Cousin   . Breast cancer Cousin     *** Current Outpatient Medications on File Prior to Visit  Medication Sig Dispense Refill  . aspirin 81 MG tablet Take 81 mg by mouth daily.      Marland Kitchen BIOTIN PO Take by mouth.    . Cholecalciferol (VITAMIN D3) 3000 units TABS Take by mouth.    . Choline Fenofibrate 135 MG capsule Take 135 mg by mouth daily.      . DULoxetine (CYMBALTA) 60 MG capsule TAKE 1 CAPSULE BY MOUTH TWICE A DAY 180 capsule 0  . Ferrous Sulfate (IRON) 325 (65 Fe) MG TABS Take by mouth.    . furosemide (LASIX) 20 MG tablet TAKE 1 (ONE) TABLET ONCE DAILY AS NEEDED    . levothyroxine (SYNTHROID, LEVOTHROID) 50 MCG tablet Take 50 mcg by mouth daily before breakfast.    . Magnesium 500 MG TABS Take by mouth.    . metFORMIN (GLUCOPHAGE-XR) 500 MG 24 hr tablet Take 500 mg by mouth 2 (two) times daily.   3  . methylphenidate (RITALIN) 10 MG tablet 1/2 tablet at AM and noon for 1 week, then 1 in AM and Noon 60 tablet 0  . Multiple Vitamin (MULTIVITAMIN) tablet Take 1 tablet by mouth daily.      . Omega-3 Fatty Acids (FISH OIL) 500 MG CAPS Take 1 capsule by mouth 2 (two) times daily.     Marland Kitchen omeprazole (PRILOSEC) 20 MG capsule TAKE 1 CAPSULE (20 MG TOTAL) BY MOUTH 2 (TWO) TIMES DAILY BEFORE A MEAL. 180 capsule 0  . ONE TOUCH ULTRA TEST test strip CHECK BLOOD SUGAR TWICE DAILY 100 each 3  . ONETOUCH DELICA LANCETS 97Q MISC USE AS DIRECTED TWICE DAILY 200 each 3  . RESTASIS MULTIDOSE 0.05 % ophthalmic emulsion INSTILL 1 DROP BY OPHTHALMIC ROUTE EVERY 12 HOURS INTO AFFECTED EYE(S)  6  . rosuvastatin (CRESTOR) 20 MG tablet Take 10 mg by mouth daily.     Marland Kitchen tiZANidine (ZANAFLEX) 2 MG tablet Take 1 tablet (2 mg total) by mouth 2 (two) times daily as needed. No additional refills until CPE in April 180 tablet 0  . valsartan-hydrochlorothiazide (DIOVAN-HCT) 320-25 MG tablet TAKE 1 TABLET BY MOUTH EVERY DAY 90 tablet 2  . Vitamin D, Ergocalciferol, (DRISDOL) 1.25 MG  (50000 UT)  CAPS capsule Take 1 capsule (50,000 Units total) by mouth every 7 (seven) days. 5 capsule 5  . warfarin (COUMADIN) 5 MG tablet TAKE AS DIRECTED BY COUMADIN CLINIC. 90 tablet 1   No current facility-administered medications on file prior to visit.     Cardiovascular studies:  ***  *** Recent labs: ***  ROS      *** There were no vitals filed for this visit. (Measured by the patient using a home BP monitor)  Physical Exam: Not performed, as this is a telephone visit        Assessment & Recommendations:  ***  ***   Inri Sobieski Esther Hardy, MD [facility] Pager: [phone] Office: [phone] If no answer Cell [phone]

## 2018-08-15 ENCOUNTER — Other Ambulatory Visit: Payer: Self-pay | Admitting: Family Medicine

## 2018-08-15 ENCOUNTER — Other Ambulatory Visit: Payer: Self-pay

## 2018-08-15 ENCOUNTER — Encounter: Payer: Self-pay | Admitting: Psychiatry

## 2018-08-15 ENCOUNTER — Ambulatory Visit (INDEPENDENT_AMBULATORY_CARE_PROVIDER_SITE_OTHER): Payer: Medicare Other | Admitting: Psychiatry

## 2018-08-15 DIAGNOSIS — F338 Other recurrent depressive disorders: Secondary | ICD-10-CM | POA: Diagnosis not present

## 2018-08-15 DIAGNOSIS — R7989 Other specified abnormal findings of blood chemistry: Secondary | ICD-10-CM

## 2018-08-15 DIAGNOSIS — G4721 Circadian rhythm sleep disorder, delayed sleep phase type: Secondary | ICD-10-CM

## 2018-08-15 DIAGNOSIS — F331 Major depressive disorder, recurrent, moderate: Secondary | ICD-10-CM | POA: Diagnosis not present

## 2018-08-15 DIAGNOSIS — F411 Generalized anxiety disorder: Secondary | ICD-10-CM

## 2018-08-15 DIAGNOSIS — F4001 Agoraphobia with panic disorder: Secondary | ICD-10-CM | POA: Diagnosis not present

## 2018-08-15 MED ORDER — METHYLPHENIDATE HCL 10 MG PO TABS: 15.0000 mg | ORAL_TABLET | Freq: Two times a day (BID) | ORAL | 0 refills | Status: DC

## 2018-08-15 NOTE — Progress Notes (Signed)
Andrea Simmons [id number] 1940-07-06 78 y.o.  Subjective:   Patient ID:  Andrea Simmons is a 78 y.o. (DOB 09-May-1940) female.  Chief Complaint:  Chief Complaint  Patient presents with  . Follow-up    Medication Management  . Depression    Medication Management    Depression         Associated symptoms include no decreased concentration and no suicidal ideas.   Andrea Simmons presents  today for recent exacerbation of depression..    Last visit was July 09, 2018.  Vraylar was discontinued for lack of response.  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  Not enough benefit.  Energy and interest is still low and motivation is low.      Going backwards in interest and motivation.  Wants to sit at home and mope.  Hopeful about spring.  Depressed.  Some anxiety but not real bad.  Sleep is better and less oversleeping.  Less napping.   Don't care about things.  Was More talkative on the Abilify and the Wellbutrin.   History of seasonal depression .May 28, 2018 complaining of depression.  She had lost the benefit of Abilify and stopped it.  We started Vraylar in its place which she took at 1.5 mg every other day for 2 to 3 weeks and then daily for a couple of weeks with no response and she has stopped it as well..  She has had a recent relapse of depression and anxiety.  She felt she was losing benefit from Abilify and it was discontinued.    Past Psychiatric Medication Trials: Rozerem, zolpidem. Duloxetine 120, Paxil 20 for years, Sertraline, fluoxetine, Wellbutrin, Abilify weight gain and loss benefit, lithium 2004 SE tremor at 600mg  daily. Low dose nortriptyline, Vraylar NR, Buspar NR.  Review of Systems:  Review of Systems  Constitutional: Positive for unexpected weight change.  Musculoskeletal: Positive for back pain and joint swelling. Negative for neck stiffness.   Neurological: Negative for tremors, weakness and numbness.  Psychiatric/Behavioral: Positive for depression and dysphoric mood. Negative for agitation, behavioral problems, confusion, decreased concentration, hallucinations, self-injury, sleep disturbance and suicidal ideas. The patient is not nervous/anxious and is not hyperactive.    Hx anemia.    Cardiologist said she had a stiff heart.  History of pulmonary embolism and on Coumadin.  Medications: I have reviewed the patient's current medications.  Current Outpatient Medications  Medication Sig Dispense Refill  . aspirin 81 MG tablet Take 81 mg by mouth daily.      Marland Kitchen BIOTIN PO Take by mouth.    . Cholecalciferol (VITAMIN D3) 3000 units TABS Take by mouth.    . Choline Fenofibrate 135 MG capsule Take 135 mg by mouth daily.      . DULoxetine (CYMBALTA) 60 MG capsule TAKE 1 CAPSULE BY MOUTH TWICE A DAY 180 capsule 0  . Ferrous Sulfate (IRON) 325 (65 Fe) MG TABS Take by mouth.    . furosemide (LASIX) 20 MG tablet Take 20 mg by mouth daily.     Marland Kitchen levothyroxine (SYNTHROID, LEVOTHROID) 50 MCG tablet Take 50 mcg by mouth daily before breakfast.    . Magnesium 500 MG TABS Take by mouth.    . metFORMIN (GLUCOPHAGE-XR) 500 MG 24 hr tablet Take 500 mg by mouth 2 (two) times daily.   3  . methylphenidate (RITALIN) 10 MG tablet Take 1.5  tablets (15 mg total) by mouth 2 (two) times daily with breakfast and lunch. 90 tablet 0  . Multiple Vitamin (MULTIVITAMIN) tablet Take 1 tablet by mouth daily.      . Omega-3 Fatty Acids (FISH OIL) 500 MG CAPS Take 1 capsule by mouth 2 (two) times daily.     Marland Kitchen omeprazole (PRILOSEC) 20 MG capsule TAKE 1 CAPSULE (20 MG TOTAL) BY MOUTH 2 (TWO) TIMES DAILY BEFORE A MEAL. 180 capsule 0  . ONE TOUCH ULTRA TEST test strip CHECK BLOOD SUGAR TWICE DAILY 100 each 3  . ONETOUCH DELICA LANCETS 16X MISC USE AS DIRECTED TWICE DAILY 200 each 3  . RESTASIS MULTIDOSE 0.05 % ophthalmic emulsion INSTILL 1 DROP BY OPHTHALMIC ROUTE  EVERY 12 HOURS INTO AFFECTED EYE(S)  6  . rosuvastatin (CRESTOR) 20 MG tablet Take 10 mg by mouth daily.     Marland Kitchen tiZANidine (ZANAFLEX) 2 MG tablet Take 1 tablet (2 mg total) by mouth 2 (two) times daily as needed. No additional refills until CPE in April 180 tablet 0  . valsartan-hydrochlorothiazide (DIOVAN-HCT) 320-25 MG tablet TAKE 1 TABLET BY MOUTH EVERY DAY 90 tablet 2  . Vitamin D, Ergocalciferol, (DRISDOL) 1.25 MG (50000 UT) CAPS capsule Take 1 capsule (50,000 Units total) by mouth every 7 (seven) days. 5 capsule 5  . warfarin (COUMADIN) 5 MG tablet TAKE AS DIRECTED BY COUMADIN CLINIC. 90 tablet 1   No current facility-administered medications for this visit.     Medication Side Effects: None a little ankle swelling and increased appetitel  Allergies:  Allergies  Allergen Reactions  . Anaprox [Naproxen Sodium]   . Lipitor [Atorvastatin]   . Meperidine Hcl   . Morphine     Past Medical History:  Diagnosis Date  . Anemia   . Anxiety   . Breast cancer ([facility])   . Depression   . Diabetes mellitus type II   . Fibromyalgia   . GERD (gastroesophageal reflux disease)   . Hyperlipidemia   . Hypertension   . Malignant neoplasm of breast (female), unspecified site 1993   L breast s/p mastectomy and tamoxifen x 74yrs  . 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 Unspecified hypothyroidism     Family History  Problem Relation Age of Onset  . Depression Other        Parent  . Arthritis Other        Parent, Grandparent  . Hypertension Other        Grandparent  . Hyperlipidemia Other        Bricelyn  . Miscarriages / Stillbirths Other        Grandparent  . Stroke Other        [facility]  . Cancer Maternal Uncle        prostate  . Hypertension Maternal Grandfather   . Breast cancer Cousin   . Breast cancer Cousin     Social History   Socioeconomic History  . Marital status: Married    Spouse name: Not on file  . Number of children: Not on  file  . Years of education: Not on file  . Highest education level: Not on file  Occupational History  . Occupation: Armed forces technical officer: RETIRED  . Occupation: Emergency planning/management officer  . Occupation: school bus driver  Social Needs  . Financial resource strain: Not on file  . Food insecurity:    Worry: Not on file  Inability: Not on file  . Transportation needs:    Medical: Not on file    Non-medical: Not on file  Tobacco Use  . Smoking status: Never Smoker  . Smokeless tobacco: Never Used  Substance and Sexual Activity  . Alcohol use: No    Alcohol/week: 0.0 standard drinks  . Drug use: No  . Sexual activity: Not on file  Lifestyle  . Physical activity:    Days per week: Not on file    Minutes per session: Not on file  . Stress: Not on file  Relationships  . Social connections:    Talks on phone: Not on file    Gets together: Not on file    Attends religious service: Not on file    Active member of club or organization: Not on file    Attends meetings of clubs or organizations: Not on file    Relationship status: Not on file  . Intimate partner violence:    Fear of current or ex partner: Not on file    Emotionally abused: Not on file    Physically abused: Not on file    Forced sexual activity: Not on file  Other Topics Concern  . Not on file  Social History Narrative  . Not on file    Past Medical History, Surgical history, Social history, and Family history were reviewed and updated as appropriate.   Please see review of systems for further details on the patient's review from today.   Objective:   Physical Exam:  There were no vitals taken for this visit.  Physical Exam Neurological:     Mental Status: She is alert and oriented to person, place, and time.     Cranial Nerves: No dysarthria.  Psychiatric:        Attention and Perception: Attention normal.        Mood and Affect: Mood is depressed. Mood is not anxious.        Speech: Speech normal.         Behavior: Behavior is cooperative.        Thought Content: Thought content normal. Thought content is not paranoid or delusional. Thought content does not include homicidal or suicidal ideation. Thought content does not include homicidal or suicidal plan.        Cognition and Memory: Cognition and memory normal.        Judgment: Judgment normal.     Comments: Insight fair-good.  Still unmotivated.     Lab Review:     Component Value Date/Time   NA 143 07/05/2018 0949   NA 141 08/01/2016 1527   K 4.0 07/05/2018 0949   K 4.6 08/01/2016 1527   CL 107 07/05/2018 0949   CO2 26 07/05/2018 0949   CO2 24 08/01/2016 1527   GLUCOSE 105 (H) 07/05/2018 0949   GLUCOSE 93 08/01/2016 1527   BUN 15 07/05/2018 0949   BUN 19.9 08/01/2016 1527   CREATININE 0.96 07/05/2018 0949   CREATININE 0.9 08/01/2016 1527   CALCIUM 9.2 07/05/2018 0949   CALCIUM 9.7 08/01/2016 1527   PROT 6.4 07/05/2018 0949   PROT 7.2 08/01/2016 1527   PROT 6.8 08/01/2016 1527   ALBUMIN 4.2 07/05/2018 0949   ALBUMIN 4.0 08/01/2016 1527   AST 32 07/05/2018 0949   AST 41 (H) 08/01/2016 1527   ALT 19 07/05/2018 0949   ALT 25 08/01/2016 1527   ALKPHOS 37 (L) 07/05/2018 0949   ALKPHOS 48 08/01/2016 1527   BILITOT 0.5 07/05/2018  0949   BILITOT 0.33 08/01/2016 1527   GFRNONAA 84 (L) 09/01/2013 1638   GFRAA >90 09/01/2013 1638       Component Value Date/Time   WBC 4.5 07/05/2018 0949   RBC 3.59 (L) 07/05/2018 0949   HGB 11.1 (L) 07/05/2018 0949   HGB 9.5 (L) 08/01/2016 1527   HCT 32.3 (L) 07/05/2018 0949   HCT 31.2 (L) 08/01/2016 1527   PLT 197.0 07/05/2018 0949   PLT 221 08/01/2016 1527   MCV 90.0 07/05/2018 0949   MCV 83.9 08/01/2016 1527   MCH 25.5 08/01/2016 1527   MCH 29.4 09/01/2013 1638   MCHC 34.2 07/05/2018 0949   RDW 14.2 07/05/2018 0949   RDW 17.2 (H) 08/01/2016 1527   LYMPHSABS 1.5 07/05/2018 0949   LYMPHSABS 2.0 08/01/2016 1527   MONOABS 0.4 07/05/2018 0949   MONOABS 0.4 08/01/2016 1527    EOSABS 0.1 07/05/2018 0949   EOSABS 0.1 08/01/2016 1527   BASOSABS 0.0 07/05/2018 0949   BASOSABS 0.0 08/01/2016 1527    No results found for: POCLITH, LITHIUM   No results found for: PHENYTOIN, PHENOBARB, VALPROATE, CBMZ   Endo checked thyroid lately.   Vitamin D level on April 01, 2018 reported as 40.  After that vitamin D 50,000 units weekly was prescribed with a goal of achieving levels in the 50s and 60s. .res Assessment: Plan:    Major depressive disorder, recurrent episode, moderate (HCC)  Seasonal depression (HCC)  Generalized anxiety disorder  Panic disorder with agoraphobia  Low vitamin D level  Delayed sleep phase syndrome   Greater than 50% of face to face time with patient was spent on counseling and coordination of care. We discussed several things.  Relapse depression even worse off the Abilify.  Recent failures of Vraylar and Buspirone.  With the Ritalin trial she has noted that she is up and awake and less in bed during the day.  This is helped correct her sleep at night.  However she still unmotivated lacking energy and productivity.  Ritalin has solved delayed sleep phase syndrome.  Since she has had a limited response to the Ritalin and is tolerating it well increase Ritalin to 15 mg twice daily.  Discussed side effects again as noted in the last visit.  Options pramipexole, Rexulti,  change to Trintellix, stimulant off label.  Disc SE and options.    Hesitate to change duloxetine bc likely to affect INR levels.  May ultimately have to do this if we cannot get a response from the stimulant.  Continue vitamin D.  Would like to get another level at some point.  This appt was 30 mins.  FU 1 mos  Lynder Parents, MD, DFAPA   Please see After Visit Summary for patient specific instructions.  Future Appointments  Date Time Provider [place]  08/19/2018  2:45 PM Nigel Mormon, MD [facility] None  08/21/2018  1:30 PM [facility]   09/06/2018  1:40 PM [facility] MM 3 [facility]  10/17/2018  9:00 AM [name], MD [facility]    No orders of the defined types were placed in this encounter.     -------------------------------

## 2018-08-19 ENCOUNTER — Ambulatory Visit (INDEPENDENT_AMBULATORY_CARE_PROVIDER_SITE_OTHER): Payer: Medicare Other | Admitting: Cardiology

## 2018-08-19 ENCOUNTER — Other Ambulatory Visit: Payer: Self-pay

## 2018-08-19 ENCOUNTER — Encounter: Payer: Self-pay | Admitting: Cardiology

## 2018-08-19 VITALS — Ht 64.0 in | Wt 145.0 lb

## 2018-08-19 DIAGNOSIS — R0609 Other forms of dyspnea: Secondary | ICD-10-CM

## 2018-08-19 NOTE — Progress Notes (Signed)
   Telephone visit note  Subjective:   Andrea Simmons, female    DOB: 01-27-1941, 78 y.o.   MRN: [id number]   I connected with the patient on 08/19/18 by a telephone call and verified that I am speaking with the correct person using two identifiers.     I offered the patient a video enabled application for a virtual visit. Unfortunately, this could not be accomplished due to technical difficulties/lack of video enabled phone/computer. I discussed the limitations of evaluation and management by telemedicine and the availability of in person appointments. The patient expressed understanding and agreed to proceed.   This visit type was conducted due to national recommendations for restrictions regarding the COVID-19 Pandemic (e.g. social distancing).  This format is felt to be most appropriate for this patient at this time.  All issues noted in this document were discussed and addressed.  No physical exam was performed (except for noted visual exam findings with Tele health visits).  The patient has consented to conduct a Tele health visit and understands insurance will be billed.   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  Patient is doing well since last visit. Breathing has improved. She has regular f/u w/PCP. Cardiac workup unremarkable, as below.   Cardiovascular studies:  Lexiscan myoview stress test 03/31/2017: 1. The resting electrocardiogram demonstrated normal sinus rhythm, LAD, LAFB, RBBB and no resting arrhythmias.  The stress electrocardiogram was non-diagnostic due to pharmacologic stress. Stress symptoms included dyspnea. Resting BP 166/86 and peak BP 202/88 mm Hg.  2. Myocardial perfusion imaging is normal. Overall left ventricular systolic function was normal without  regional wall motion abnormalities. The left ventricular ejection fraction was 56%.  This is a low risk study.  Echocardiogram 01/28/2018:  Left ventricle cavity is normal in size. Moderate concentric hypertrophy of the left ventricle. Normal global wall motion. Doppler evidence of grade I (impaired) diastolic dysfunction, normal LAP. Calculated EF 55%. Left atrial cavity is mildly dilated. Mild (Grade I) aortic regurgitation. Mild (Grade I) mitral regurgitation. Trace tricuspid regurgitation. Inadequate tricuspid regurgitation jet to estimate pulmonary artery pressure. Normal right atrial pressure.   I will see her on as needed basis.  Time spent: 10 min  [place], MD [facility] [place] Pager: [phone] Office: [phone] If no answer Cell [phone]

## 2018-08-20 ENCOUNTER — Encounter: Payer: Self-pay | Admitting: Cardiology

## 2018-08-21 ENCOUNTER — Other Ambulatory Visit: Payer: Self-pay

## 2018-08-21 ENCOUNTER — Ambulatory Visit (INDEPENDENT_AMBULATORY_CARE_PROVIDER_SITE_OTHER): Payer: Medicare Other | Admitting: General Practice

## 2018-08-21 DIAGNOSIS — Z7901 Long term (current) use of anticoagulants: Secondary | ICD-10-CM

## 2018-08-21 DIAGNOSIS — I2699 Other pulmonary embolism without acute cor pulmonale: Secondary | ICD-10-CM

## 2018-08-21 LAB — POCT INR: INR: 2.4 (ref 2.0–3.0)

## 2018-08-21 NOTE — Patient Instructions (Signed)
Pre visit review using our clinic review tool, if applicable. No additional management support is needed unless otherwise documented below in the visit note.  Continue to take 1/2 tablet daily except 1 tablet on Sundays.  Re-check in 4 weeks.

## 2018-08-21 NOTE — Progress Notes (Signed)
I have reviewed the results and agree with this plan   

## 2018-08-23 ENCOUNTER — Telehealth: Payer: Self-pay | Admitting: Family Medicine

## 2018-08-23 NOTE — Telephone Encounter (Signed)
Pt scheduled for Monday with [place].

## 2018-08-23 NOTE — Telephone Encounter (Signed)
Start Zyrtec or Claritin OTC and schedule visit for Monday (virtual or in office- whichever pt prefers)

## 2018-08-23 NOTE — Telephone Encounter (Signed)
VV? Preferred?

## 2018-08-23 NOTE — Telephone Encounter (Signed)
Please advise    Copied from [place] [id number]. Topic: General - Inquiry >> Aug 23, 2018 10:46 AM Mathis Bud wrote: Reason for CRM: Patient is calling dry itching/ burning eyes.  She believes it is due to allergies.  Patient would like advice from PCP or nurse to either come in for an appt or if PCP can prescribe her something.  Patient did try to come up to the office yesterday to get seen for eyes, I told patient we did not walk ins especially right now due to covid.   Call back # [phone]

## 2018-08-26 ENCOUNTER — Encounter: Payer: Self-pay | Admitting: Physician Assistant

## 2018-08-26 ENCOUNTER — Ambulatory Visit (INDEPENDENT_AMBULATORY_CARE_PROVIDER_SITE_OTHER): Payer: Medicare Other | Admitting: Physician Assistant

## 2018-08-26 ENCOUNTER — Other Ambulatory Visit: Payer: Self-pay

## 2018-08-26 DIAGNOSIS — J302 Other seasonal allergic rhinitis: Secondary | ICD-10-CM

## 2018-08-26 DIAGNOSIS — H1013 Acute atopic conjunctivitis, bilateral: Secondary | ICD-10-CM

## 2018-08-26 MED ORDER — AZELASTINE HCL 0.05 % OP SOLN: 1.0000 [drp] | Freq: Two times a day (BID) | OPHTHALMIC | 12 refills | Status: DC

## 2018-08-26 NOTE — Progress Notes (Signed)
I have discussed the procedure for the virtual visit with the patient who has given consent to proceed with assessment and treatment.   Andrea Simmons S Edenilson Austad, CMA     

## 2018-08-26 NOTE — Patient Instructions (Signed)
Instructions sent to MyChart.  Please continue to wear gloves, mask and glasses while working outside. Make sure to wash hands and then your face when coming back inside to remove any potential allergens on the face. Continue the Zyrtec as directed by Dr. Birdie Riddle. Stop the OTC Pataday and start the prescription I drop I have sent in for you.  Try this combination out for a week. If symptoms are not well-controlled please let either myself or Dr. Birdie Riddle know.  Hang in there!

## 2018-08-26 NOTE — Progress Notes (Signed)
Virtual Visit via Telephone Note  I connected with Andrea Simmons on 08/26/18 at  8:30 AM EDT by telephone and verified that I am speaking with the correct person using two identifiers.  Location: Patient: Home Provider: [place]   I discussed the limitations, risks, security and privacy concerns of performing an evaluation and management service by telephone and the availability of in person appointments. I also discussed with the patient that there may be a patient responsible charge related to this service. The patient expressed understanding and agreed to proceed.  History of Present Illness: Patient presents today via phone with acute concerns. She was not able to access any of the video platforms for visit. Patient endorses a few weeks of watery eyes, itching and burning. Notes some rhinorrhea. Mentioned symptoms via MyChart to PCP who recommended she start Zyrtec OTC for allergies until she could be seen today. Notes taking as directed with improvement. Is also using OTC Pataday but notes eyes are still itchy. Notes watering significantly improved.   Observations/Objective: Patient is in no acute distress.   No labored breathing.  Speech is clear and coherent with logical content.  Patient is alert and oriented at baseline.    Assessment and Plan: 1. Seasonal allergic rhinitis, unspecified trigger 2. Allergic conjunctivitis of both eyes Continue Zyrtec as directed by PCP. Continue saline nasal rinses. Stop Pataday OTC. Will begin trial of Optivar as directed. Follow-up if not improving.  - azelastine (OPTIVAR) 0.05 % ophthalmic solution; Place 1 drop into both eyes 2 (two) times daily.  Dispense: 6 mL; Refill: 12   Follow Up Instructions:  I discussed the assessment and treatment plan with the patient. The patient was provided an opportunity to ask questions and all were answered. The patient agreed with the plan and demonstrated an understanding of the  instructions.   The patient was advised to call back or seek an in-person evaluation if the symptoms worsen or if the condition fails to improve as anticipated.  I provided 15 minutes of non-face-to-face time during this encounter.   Leeanne Rio, PA-C

## 2018-09-06 ENCOUNTER — Other Ambulatory Visit: Payer: Self-pay

## 2018-09-06 ENCOUNTER — Ambulatory Visit
Admission: RE | Admit: 2018-09-06 | Discharge: 2018-09-06 | Disposition: A | Discharge status: Home / Self Care | Payer: Medicare Other | Source: Ambulatory Visit | Attending: Family Medicine | Admitting: Family Medicine

## 2018-09-06 DIAGNOSIS — Z1231 Encounter for screening mammogram for malignant neoplasm of breast: Secondary | ICD-10-CM

## 2018-09-09 ENCOUNTER — Ambulatory Visit (INDEPENDENT_AMBULATORY_CARE_PROVIDER_SITE_OTHER): Payer: Medicare Other | Admitting: Family Medicine

## 2018-09-09 ENCOUNTER — Encounter: Payer: Self-pay | Admitting: Family Medicine

## 2018-09-09 ENCOUNTER — Other Ambulatory Visit: Payer: Self-pay

## 2018-09-09 DIAGNOSIS — R21 Rash and other nonspecific skin eruption: Secondary | ICD-10-CM | POA: Diagnosis not present

## 2018-09-09 MED ORDER — PREDNISONE 10 MG PO TABS: ORAL_TABLET | ORAL | 0 refills | Status: DC

## 2018-09-09 NOTE — Progress Notes (Signed)
I have discussed the procedure for the virtual visit with the patient who has given consent to proceed with assessment and treatment.   Pt not able to obtain vitals.   Davis Gourd, CMA

## 2018-09-09 NOTE — Progress Notes (Signed)
Virtual Visit via Video   I connected with patient on 09/09/18 at  2:40 PM EDT by a video enabled telemedicine application and verified that I am speaking with the correct person using two identifiers.  Location patient: Home Location provider: Acupuncturist, Office Persons participating in the virtual visit: Patient, Provider, [place] (Jess B)  I discussed the limitations of evaluation and management by telemedicine and the availability of in person appointments. The patient expressed understanding and agreed to proceed.  Interactive audio and video telecommunications were attempted between this provider and patient, however failed, due to patient having technical difficulties OR patient did not have access to video capability.  We continued and completed visit with audio only.   Subjective:   HPI:   Red, itchy face- pt reports sxs started ~8 weeks ago.  Started using eye drops ~2 weeks ago w/o relief.  Pt has been scratching, picking.  She reports skin is peeling.  sxs worsened w/ working in the garden and exposure to flowers.  sxs worsen as the day goes on.  Has not recently changed products.  Taking Zyrtec w/o improvement.  Itching- on face, scalp.  ROS:   See pertinent positives and negatives per HPI.  Patient Active Problem List   Diagnosis Date Noted  . Hx of pulmonary embolus 03/30/2017  . Physical exam 11/30/2016  . Hyperlipidemia 12/26/2013  . Encounter for therapeutic drug monitoring 04/16/2013  . 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 Long term (current) use of anticoagulants 06/30/2010  . Exertional dyspnea 11/09/2008  . Diabetes type 2, controlled ([place]) 08/27/2008  . ANEMIA-NOS 08/27/2008  . Seasonal and perennial allergic rhinitis 08/27/2008  . PULMONARY NODULE 08/27/2008  . Fibromyalgia 08/27/2008  . ADENOCARCINOMA, BREAST 03/20/2007  . Hypothyroidism 03/20/2007  . Essential hypertension 03/20/2007  . Recurrent  pulmonary emboli ([place]) 03/20/2007  . GERD 03/20/2007  . ESOPHAGEAL STRICTURE 03/08/2007    Social History   Tobacco Use  . Smoking status: Never Smoker  . Smokeless tobacco: Never Used  Substance Use Topics  . Alcohol use: No    Alcohol/week: 0.0 standard drinks    Current Outpatient Medications:  .  azelastine (OPTIVAR) 0.05 % ophthalmic solution, Place 1 drop into both eyes 2 (two) times daily., Disp: 6 mL, Rfl: 12 .  BIOTIN PO, Take by mouth., Disp: , Rfl:  .  Cholecalciferol (VITAMIN D3) 3000 units TABS, Take by mouth., Disp: , Rfl:  .  Choline Fenofibrate 135 MG capsule, Take 135 mg by mouth daily.  , Disp: , Rfl:  .  DULoxetine (CYMBALTA) 60 MG capsule, TAKE 1 CAPSULE BY MOUTH TWICE A DAY, Disp: 180 capsule, Rfl: 0 .  Ferrous Sulfate (IRON) 325 (65 Fe) MG TABS, Take by mouth., Disp: , Rfl:  .  furosemide (LASIX) 20 MG tablet, Take 20 mg by mouth as needed. , Disp: , Rfl:  .  levothyroxine (SYNTHROID, LEVOTHROID) 50 MCG tablet, Take 50 mcg by mouth daily before breakfast., Disp: , Rfl:  .  Magnesium 500 MG TABS, Take by mouth., Disp: , Rfl:  .  metFORMIN (GLUCOPHAGE-XR) 500 MG 24 hr tablet, Take 500 mg by mouth 2 (two) times daily. , Disp: , Rfl: 3 .  methylphenidate (RITALIN) 10 MG tablet, Take 1.5 tablets (15 mg total) by mouth 2 (two) times daily with breakfast and lunch., Disp: 90 tablet, Rfl: 0 .  Multiple Vitamin (MULTIVITAMIN) tablet, Take 1 tablet by mouth daily.  , Disp: , Rfl:  .  Omega-3 Fatty Acids (FISH OIL) 500 MG CAPS, Take 1 capsule by mouth 2 (two) times daily. , Disp: , Rfl:  .  omeprazole (PRILOSEC) 20 MG capsule, TAKE 1 CAPSULE (20 MG TOTAL) BY MOUTH 2 (TWO) TIMES DAILY BEFORE A MEAL., Disp: 180 capsule, Rfl: 0 .  ONE TOUCH ULTRA TEST test strip, CHECK BLOOD SUGAR TWICE DAILY, Disp: 100 each, Rfl: 3 .  ONETOUCH DELICA LANCETS 43H MISC, USE AS DIRECTED TWICE DAILY, Disp: 200 each, Rfl: 3 .  RESTASIS MULTIDOSE 0.05 % ophthalmic emulsion, INSTILL 1 DROP BY  OPHTHALMIC ROUTE EVERY 12 HOURS INTO AFFECTED EYE(S), Disp: , Rfl: 6 .  rosuvastatin (CRESTOR) 20 MG tablet, Take 10 mg by mouth daily. , Disp: , Rfl:  .  tiZANidine (ZANAFLEX) 2 MG tablet, TAKE 1 TABLET BY MOUTH 2 TIMES DAILY AS NEEDED. NO ADDITIONAL REFILLS UNTIL CPE IN APRIL (Patient taking differently: 2 mg at bedtime. ), Disp: 180 tablet, Rfl: 0 .  valsartan-hydrochlorothiazide (DIOVAN-HCT) 320-25 MG tablet, TAKE 1 TABLET BY MOUTH EVERY DAY, Disp: 90 tablet, Rfl: 2 .  Vitamin D, Ergocalciferol, (DRISDOL) 1.25 MG (50000 UT) CAPS capsule, Take 1 capsule (50,000 Units total) by mouth every 7 (seven) days., Disp: 5 capsule, Rfl: 5 .  warfarin (COUMADIN) 5 MG tablet, TAKE AS DIRECTED BY COUMADIN CLINIC. (Patient taking differently: 5mg  on Sunday, 2.5mg  All other days), Disp: 90 tablet, Rfl: 1  Allergies  Allergen Reactions  . Anaprox [Naproxen Sodium]   . Lipitor [Atorvastatin]   . Meperidine Hcl   . Morphine     Objective:   There were no vitals taken for this visit.  Pt is able to speak clearly, coherently without shortness of breath or increased work of breathing.  Thought process is linear.  Mood is appropriate.   Assessment and Plan:   Facial rash- new.  Unclear based on pt's description if this is severe rosacea or a contact dermatitis of some kind.  Start prednisone taper based on pt's description of severe itching and redness.  If no improvement will need OV.  Pt expressed understanding and is in agreement w/ plan.    Annye Asa, MD 09/09/2018  Time spent with the patient: 13 minutes, of which >50% was spent in obtaining information about symptoms, reviewing previous labs, evaluations, and treatments, counseling about condition (please see the discussed topics above), and developing a plan to further investigate it; had a number of questions which I addressed.

## 2018-09-10 ENCOUNTER — Encounter: Payer: Self-pay | Admitting: Family Medicine

## 2018-09-14 ENCOUNTER — Other Ambulatory Visit: Payer: Self-pay | Admitting: Psychiatry

## 2018-09-18 ENCOUNTER — Ambulatory Visit: Payer: Medicare Other | Admitting: General Practice

## 2018-09-18 ENCOUNTER — Other Ambulatory Visit: Payer: Self-pay

## 2018-09-18 DIAGNOSIS — I2699 Other pulmonary embolism without acute cor pulmonale: Secondary | ICD-10-CM

## 2018-09-18 DIAGNOSIS — Z7901 Long term (current) use of anticoagulants: Secondary | ICD-10-CM

## 2018-09-18 LAB — POCT INR: INR: 3.2 — AB (ref 2.0–3.0)

## 2018-09-18 NOTE — Patient Instructions (Signed)
Pre visit review using our clinic review tool, if applicable. No additional management support is needed unless otherwise documented below in the visit note.  Hold coumadin today (7/1) and then continue to take 1/2 tablet daily except 1 tablet on Sundays.  Re-check in 3 weeks.

## 2018-10-02 ENCOUNTER — Telehealth: Payer: Self-pay | Admitting: Psychiatry

## 2018-10-02 ENCOUNTER — Other Ambulatory Visit: Payer: Self-pay

## 2018-10-02 MED ORDER — METHYLPHENIDATE HCL 10 MG PO TABS: 15.0000 mg | ORAL_TABLET | Freq: Two times a day (BID) | ORAL | 0 refills | Status: DC

## 2018-10-02 NOTE — Telephone Encounter (Signed)
Patient need refill on generic Ritalin to be sent to [facility] in [place].  Need medication by Friday morning

## 2018-10-02 NOTE — Telephone Encounter (Signed)
Pended for approval.

## 2018-10-09 ENCOUNTER — Ambulatory Visit (INDEPENDENT_AMBULATORY_CARE_PROVIDER_SITE_OTHER): Payer: Medicare Other | Admitting: General Practice

## 2018-10-09 ENCOUNTER — Other Ambulatory Visit: Payer: Self-pay

## 2018-10-09 DIAGNOSIS — Z7901 Long term (current) use of anticoagulants: Secondary | ICD-10-CM | POA: Diagnosis not present

## 2018-10-09 DIAGNOSIS — I2699 Other pulmonary embolism without acute cor pulmonale: Secondary | ICD-10-CM

## 2018-10-09 LAB — POCT INR: INR: 2.2 (ref 2.0–3.0)

## 2018-10-09 NOTE — Patient Instructions (Signed)
Pre visit review using our clinic review tool, if applicable. No additional management support is needed unless otherwise documented below in the visit note.  Continue to take 1/2 tablet daily except 1 tablet on Sundays.  Re-check in 4 weeks.

## 2018-10-14 LAB — HEMOGLOBIN A1C: Hemoglobin A1C: 6.7

## 2018-10-16 ENCOUNTER — Other Ambulatory Visit: Payer: Self-pay

## 2018-10-16 ENCOUNTER — Ambulatory Visit (INDEPENDENT_AMBULATORY_CARE_PROVIDER_SITE_OTHER): Payer: Medicare Other | Admitting: Psychiatry

## 2018-10-16 ENCOUNTER — Encounter: Payer: Self-pay | Admitting: Psychiatry

## 2018-10-16 VITALS — BP 133/69 | HR 71

## 2018-10-16 DIAGNOSIS — F4001 Agoraphobia with panic disorder: Secondary | ICD-10-CM | POA: Diagnosis not present

## 2018-10-16 DIAGNOSIS — R7989 Other specified abnormal findings of blood chemistry: Secondary | ICD-10-CM

## 2018-10-16 DIAGNOSIS — G4721 Circadian rhythm sleep disorder, delayed sleep phase type: Secondary | ICD-10-CM

## 2018-10-16 DIAGNOSIS — F338 Other recurrent depressive disorders: Secondary | ICD-10-CM

## 2018-10-16 DIAGNOSIS — F331 Major depressive disorder, recurrent, moderate: Secondary | ICD-10-CM | POA: Diagnosis not present

## 2018-10-16 DIAGNOSIS — F411 Generalized anxiety disorder: Secondary | ICD-10-CM | POA: Diagnosis not present

## 2018-10-16 MED ORDER — METHYLPHENIDATE HCL 20 MG PO TABS: 20.0000 mg | ORAL_TABLET | Freq: Two times a day (BID) | ORAL | 0 refills | Status: DC

## 2018-10-16 NOTE — Progress Notes (Signed)
Andrea Simmons [id number] 23-Oct-1940 78 y.o.  Subjective:   Patient ID:  Andrea Simmons is a 78 y.o. (DOB 12-04-1940) female.  Chief Complaint:  Chief Complaint  Patient presents with  . Follow-up    Medication Management    Depression        Associated symptoms include no decreased concentration and no suicidal ideas.   Andrea Simmons presents  today for recent exacerbation of depression..    At visit was July 09, 2018.  Vraylar was discontinued for lack of response.  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  This was partially helpful.  Last seen Aug 15, 2018 and Ritalin was increased to 15 mg twice daily because of partial response at the lower dose.  Always been talkative but H notes Andrea Simmons's more talkative.   I'm better than I was but still not where Andrea Simmons needs to be.  No SE. Does not feel it kick in and wear off.  Does have to go to bed earlier about 10 pm which is unusual for her.  I feel good taking it.  Most days feels pretty good.  Can have crying spells without reason even before the Ritalin.  Andrea Simmons feels OK about the Ritalin and increasing it a little more.   Doing much more than I was.    Not enough benefit.  Energy and interest is still low and motivation is low.      Was More talkative on the Abilify and the Wellbutrin.   History of seasonal depression .May 28, 2018 complaining of depression.    We started Vraylar in its place which Andrea Simmons took at 1.5 mg every other day for 2 to 3 weeks and then daily for a couple of weeks with no response and Andrea Simmons has stopped it as well..  Andrea Simmons has had a recent relapse of depression and anxiety.  Andrea Simmons felt Andrea Simmons was losing benefit from Abilify and it was discontinued.    Past Psychiatric Medication Trials: Rozerem, zolpidem. Duloxetine 120, Paxil 20 for years, Sertraline, fluoxetine, Wellbutrin, Abilify weight gain and loss benefit,  lithium 2004 SE tremor at 600mg  daily. Low dose nortriptyline, Vraylar NR, Buspar NR.  Review of Systems:  Review of Systems  Constitutional: Positive for unexpected weight change.  Musculoskeletal: Positive for back pain and joint swelling. Negative for neck stiffness.  Neurological: Negative for tremors, weakness and numbness.  Psychiatric/Behavioral: Positive for depression and dysphoric mood. Negative for agitation, behavioral problems, confusion, decreased concentration, hallucinations, self-injury, sleep disturbance and suicidal ideas. The patient is not nervous/anxious and is not hyperactive.    Hx anemia.    Cardiologist said Andrea Simmons had a stiff heart.  History of pulmonary embolism and on Coumadin.  Medications: I have reviewed the patient's current medications.  Current Outpatient Medications  Medication Sig Dispense Refill  . azelastine (OPTIVAR) 0.05 % ophthalmic solution Place 1 drop into both eyes 2 (two) times daily. 6 mL 12  . BIOTIN PO Take by mouth.    . Cholecalciferol (VITAMIN D3) 3000 units TABS Take by mouth.    . Choline Fenofibrate 135 MG capsule Take 135 mg by mouth daily.      . DULoxetine (CYMBALTA) 60 MG capsule TAKE 1 CAPSULE BY MOUTH TWICE A DAY 180 capsule 0  . Ferrous Sulfate (IRON) 325 (65 Fe) MG TABS Take by mouth.    . furosemide (LASIX) 20  MG tablet Take 20 mg by mouth as needed.     Marland Kitchen levothyroxine (SYNTHROID, LEVOTHROID) 50 MCG tablet Take 50 mcg by mouth daily before breakfast.    . Magnesium 500 MG TABS Take by mouth.    . metFORMIN (GLUCOPHAGE-XR) 500 MG 24 hr tablet Take 500 mg by mouth 2 (two) times daily.   3  . methylphenidate (RITALIN) 10 MG tablet Take 1.5 tablets (15 mg total) by mouth 2 (two) times daily with breakfast and lunch. 90 tablet 0  . Multiple Vitamin (MULTIVITAMIN) tablet Take 1 tablet by mouth daily.      . Omega-3 Fatty Acids (FISH OIL) 500 MG CAPS Take 1 capsule by mouth 2 (two) times daily.     Marland Kitchen omeprazole (PRILOSEC) 20 MG  capsule TAKE 1 CAPSULE (20 MG TOTAL) BY MOUTH 2 (TWO) TIMES DAILY BEFORE A MEAL. 180 capsule 0  . ONE TOUCH ULTRA TEST test strip CHECK BLOOD SUGAR TWICE DAILY 100 each 3  . ONETOUCH DELICA LANCETS 63F MISC USE AS DIRECTED TWICE DAILY 200 each 3  . RESTASIS MULTIDOSE 0.05 % ophthalmic emulsion INSTILL 1 DROP BY OPHTHALMIC ROUTE EVERY 12 HOURS INTO AFFECTED EYE(S)  6  . rosuvastatin (CRESTOR) 20 MG tablet Take 10 mg by mouth daily.     Marland Kitchen tiZANidine (ZANAFLEX) 2 MG tablet TAKE 1 TABLET BY MOUTH 2 TIMES DAILY AS NEEDED. NO ADDITIONAL REFILLS UNTIL CPE IN APRIL (Patient taking differently: 2 mg at bedtime. ) 180 tablet 0  . valsartan-hydrochlorothiazide (DIOVAN-HCT) 320-25 MG tablet TAKE 1 TABLET BY MOUTH EVERY DAY 90 tablet 2  . Vitamin D, Ergocalciferol, (DRISDOL) 1.25 MG (50000 UT) CAPS capsule Take 1 capsule (50,000 Units total) by mouth every 7 (seven) days. 5 capsule 5  . warfarin (COUMADIN) 5 MG tablet TAKE AS DIRECTED BY COUMADIN CLINIC. (Patient taking differently: 5mg  on Sunday, 2.5mg  All other days) 90 tablet 1   No current facility-administered medications for this visit.     Medication Side Effects: None talkative more the [facility]  Allergies:  Allergies  Allergen Reactions  . Anaprox [Naproxen Sodium]   . Lipitor [Atorvastatin]   . Meperidine     Other reaction(s): GI Intolerance  . Meperidine Hcl   . Morphine   . Naproxen Sodium     Other reaction(s): GI Intolerance    Past Medical History:  Diagnosis Date  . Anemia   . Anxiety   . Breast cancer ([place])   . Depression   . Diabetes mellitus type II   . Fibromyalgia   . GERD (gastroesophageal reflux disease)   . Hyperlipidemia   . Hypertension   . Malignant neoplasm of breast (female), unspecified site 1993   L breast s/p mastectomy and tamoxifen x 25yrs  . 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 Unspecified hypothyroidism     Family History  Problem Relation Age of Onset   . Depression Other        Parent  . Arthritis Other        Parent, Grandparent  . Hypertension Other        Grandparent  . Hyperlipidemia Other        [place]  . Miscarriages / Stillbirths Other        Grandparent  . Stroke Other        [facility]  . Cancer Maternal Uncle        prostate  . Hypertension Maternal Grandfather   .  Breast cancer Cousin   . Breast cancer Cousin     Social History   Socioeconomic History  . Marital status: Married    Spouse name: Not on file  . Number of children: 1  . Years of education: Not on file  . Highest education level: Not on file  Occupational History  . Occupation: Armed forces technical officer: RETIRED  . Occupation: Emergency planning/management officer  . Occupation: school bus driver  Social Needs  . Financial resource strain: Not on file  . Food insecurity    Worry: Not on file    Inability: Not on file  . Transportation needs    Medical: Not on file    Non-medical: Not on file  Tobacco Use  . Smoking status: Never Smoker  . Smokeless tobacco: Never Used  Substance and Sexual Activity  . Alcohol use: No    Alcohol/week: 0.0 standard drinks  . Drug use: No  . Sexual activity: Not on file  Lifestyle  . Physical activity    Days per week: Not on file    Minutes per session: Not on file  . Stress: Not on file  Relationships  . Social Herbalist on phone: Not on file    Gets together: Not on file    Attends religious service: Not on file    Active member of club or organization: Not on file    Attends meetings of clubs or organizations: Not on file    Relationship status: Not on file  . Intimate partner violence    Fear of current or ex partner: Not on file    Emotionally abused: Not on file    Physically abused: Not on file    Forced sexual activity: Not on file  Other Topics Concern  . Not on file  Social History Narrative  . Not on file    Past Medical History, Surgical history, Social history, and Family history were  reviewed and updated as appropriate.   Please see review of systems for further details on the patient's review from today.   Objective:   Physical Exam:  There were no vitals taken for this visit.  Physical Exam Constitutional:      General: Andrea Simmons is not in acute distress.    Appearance: Andrea Simmons is well-developed.  Musculoskeletal:        General: No deformity.  Neurological:     Mental Status: Andrea Simmons is alert and oriented to person, place, and time.     Cranial Nerves: No dysarthria.     Coordination: Coordination normal.  Psychiatric:        Attention and Perception: Attention normal. Andrea Simmons is attentive.        Mood and Affect: Mood is depressed. Mood is not anxious. Affect is not labile, blunt, angry or inappropriate.        Speech: Speech normal.        Behavior: Behavior normal. Behavior is cooperative.        Thought Content: Thought content normal. Thought content is not paranoid or delusional. Thought content does not include homicidal or suicidal ideation. Thought content does not include homicidal or suicidal plan.        Cognition and Memory: Cognition and memory normal.        Judgment: Judgment normal.     Comments: Insight fair-good.  Better mood but not 100%.   Talkative more than Andrea Simmons was.     Lab Review:     Component  Value Date/Time   NA 143 07/05/2018 0949   NA 141 08/01/2016 1527   K 4.0 07/05/2018 0949   K 4.6 08/01/2016 1527   CL 107 07/05/2018 0949   CO2 26 07/05/2018 0949   CO2 24 08/01/2016 1527   GLUCOSE 105 (H) 07/05/2018 0949   GLUCOSE 93 08/01/2016 1527   BUN 15 07/05/2018 0949   BUN 19.9 08/01/2016 1527   CREATININE 0.96 07/05/2018 0949   CREATININE 0.9 08/01/2016 1527   CALCIUM 9.2 07/05/2018 0949   CALCIUM 9.7 08/01/2016 1527   PROT 6.4 07/05/2018 0949   PROT 7.2 08/01/2016 1527   PROT 6.8 08/01/2016 1527   ALBUMIN 4.2 07/05/2018 0949   ALBUMIN 4.0 08/01/2016 1527   AST 32 07/05/2018 0949   AST 41 (H) 08/01/2016 1527   ALT 19 07/05/2018  0949   ALT 25 08/01/2016 1527   ALKPHOS 37 (L) 07/05/2018 0949   ALKPHOS 48 08/01/2016 1527   BILITOT 0.5 07/05/2018 0949   BILITOT 0.33 08/01/2016 1527   GFRNONAA 84 (L) 09/01/2013 1638   GFRAA >90 09/01/2013 1638       Component Value Date/Time   WBC 4.5 07/05/2018 0949   RBC 3.59 (L) 07/05/2018 0949   HGB 11.1 (L) 07/05/2018 0949   HGB 9.5 (L) 08/01/2016 1527   HCT 32.3 (L) 07/05/2018 0949   HCT 31.2 (L) 08/01/2016 1527   PLT 197.0 07/05/2018 0949   PLT 221 08/01/2016 1527   MCV 90.0 07/05/2018 0949   MCV 83.9 08/01/2016 1527   MCH 25.5 08/01/2016 1527   MCH 29.4 09/01/2013 1638   MCHC 34.2 07/05/2018 0949   RDW 14.2 07/05/2018 0949   RDW 17.2 (H) 08/01/2016 1527   LYMPHSABS 1.5 07/05/2018 0949   LYMPHSABS 2.0 08/01/2016 1527   MONOABS 0.4 07/05/2018 0949   MONOABS 0.4 08/01/2016 1527   EOSABS 0.1 07/05/2018 0949   EOSABS 0.1 08/01/2016 1527   BASOSABS 0.0 07/05/2018 0949   BASOSABS 0.0 08/01/2016 1527    No results found for: POCLITH, LITHIUM   No results found for: PHENYTOIN, PHENOBARB, VALPROATE, CBMZ   Endo checked thyroid lately.   Vitamin D level on April 01, 2018 reported as 40.  After that vitamin D 50,000 units weekly was prescribed with a goal of achieving levels in the 50s and 60s. .res Assessment: Plan:    Andrea Simmons was seen today for follow-up and depression.  Diagnoses and all orders for this visit:  Major depressive disorder, recurrent episode, moderate (HCC)  Seasonal depression (HCC)  Generalized anxiety disorder  Panic disorder with agoraphobia  Delayed sleep phase syndrome  Low vitamin D level  Greater than 50% of face to face time with patient was spent on counseling and coordination of care. We discussed several things.  Relapse depression even worse off the Abilify.  Recent failures of Vraylar and Buspirone.  Andrea Simmons is clearly better with the Ritalin and Andrea Simmons recognizes it but wants to feel a little better.  Discussed potential  benefits, risks, and side effects of stimulants with patient to include increased heart rate, palpitations, insomnia, increased anxiety, increased irritability, or decreased appetite.  Instructed patient to contact office if experiencing any significant tolerability issues. We will also follow up the talkativeness to see if it is worse as we increased the Ritalin.  Andrea Simmons does not appear manic.  Since Andrea Simmons has had a limited response to the Ritalin and is tolerating it well increase Ritalin to 15 mg twice daily.  Discussed side effects again as  noted in the last visit.  Options pramipexole, Rexulti,  change to Trintellix, stimulant off label.  Disc SE and options.    Hesitate to change duloxetine bc likely to affect INR levels.  May ultimately have to do this if we cannot get a response from the stimulant.  Continue vitamin D.  Would like to get another level at some point.  This appt was 30 mins.  FU 8 weeks  Lynder Parents, MD, DFAPA   Please see After Visit Summary for patient specific instructions.  Future Appointments  Date Time Provider [place]  10/17/2018  9:00 AM [name], MD [facility]  11/06/2018  2:30 PM [facility]    No orders of the defined types were placed in this encounter.     -------------------------------

## 2018-10-17 ENCOUNTER — Encounter: Payer: Self-pay | Admitting: Family Medicine

## 2018-10-17 ENCOUNTER — Ambulatory Visit (INDEPENDENT_AMBULATORY_CARE_PROVIDER_SITE_OTHER): Payer: Medicare Other | Admitting: Family Medicine

## 2018-10-17 ENCOUNTER — Other Ambulatory Visit: Payer: Self-pay

## 2018-10-17 ENCOUNTER — Emergency Department (HOSPITAL_COMMUNITY): Payer: Medicare Other

## 2018-10-17 ENCOUNTER — Emergency Department (HOSPITAL_COMMUNITY)
Admission: EM | Admit: 2018-10-17 | Discharge: 2018-10-17 | Disposition: A | Discharge status: Home / Self Care | Payer: Medicare Other | Attending: Emergency Medicine | Admitting: Emergency Medicine

## 2018-10-17 ENCOUNTER — Encounter (HOSPITAL_COMMUNITY): Payer: Self-pay | Admitting: Emergency Medicine

## 2018-10-17 VITALS — BP 154/74 | HR 78 | Temp 97.9°F | Resp 18 | Ht 64.0 in | Wt 146.2 lb

## 2018-10-17 DIAGNOSIS — I10 Essential (primary) hypertension: Secondary | ICD-10-CM | POA: Diagnosis not present

## 2018-10-17 DIAGNOSIS — E119 Type 2 diabetes mellitus without complications: Secondary | ICD-10-CM | POA: Diagnosis not present

## 2018-10-17 DIAGNOSIS — Z79899 Other long term (current) drug therapy: Secondary | ICD-10-CM | POA: Diagnosis not present

## 2018-10-17 DIAGNOSIS — M79602 Pain in left arm: Secondary | ICD-10-CM | POA: Insufficient documentation

## 2018-10-17 DIAGNOSIS — Z86711 Personal history of pulmonary embolism: Secondary | ICD-10-CM | POA: Diagnosis not present

## 2018-10-17 DIAGNOSIS — R079 Chest pain, unspecified: Secondary | ICD-10-CM

## 2018-10-17 DIAGNOSIS — E039 Hypothyroidism, unspecified: Secondary | ICD-10-CM | POA: Diagnosis not present

## 2018-10-17 DIAGNOSIS — R0789 Other chest pain: Secondary | ICD-10-CM | POA: Diagnosis present

## 2018-10-17 DIAGNOSIS — Z853 Personal history of malignant neoplasm of breast: Secondary | ICD-10-CM | POA: Diagnosis not present

## 2018-10-17 DIAGNOSIS — Z7901 Long term (current) use of anticoagulants: Secondary | ICD-10-CM | POA: Diagnosis not present

## 2018-10-17 DIAGNOSIS — Z7984 Long term (current) use of oral hypoglycemic drugs: Secondary | ICD-10-CM | POA: Insufficient documentation

## 2018-10-17 LAB — CBC WITH DIFFERENTIAL/PLATELET
Abs Immature Granulocytes: 0.01 10*3/uL (ref 0.00–0.07)
Basophils Absolute: 0 10*3/uL (ref 0.0–0.1)
Basophils Relative: 0 %
Eosinophils Absolute: 0 10*3/uL (ref 0.0–0.5)
Eosinophils Relative: 1 %
HCT: 33.6 % — ABNORMAL LOW (ref 36.0–46.0)
Hemoglobin: 10.7 g/dL — ABNORMAL LOW (ref 12.0–15.0)
Immature Granulocytes: 0 %
Lymphocytes Relative: 33 %
Lymphs Abs: 1.6 10*3/uL (ref 0.7–4.0)
MCH: 30.4 pg (ref 26.0–34.0)
MCHC: 31.8 g/dL (ref 30.0–36.0)
MCV: 95.5 fL (ref 80.0–100.0)
Monocytes Absolute: 0.5 10*3/uL (ref 0.1–1.0)
Monocytes Relative: 10 %
Neutro Abs: 2.6 10*3/uL (ref 1.7–7.7)
Neutrophils Relative %: 56 %
Platelets: 197 10*3/uL (ref 150–400)
RBC: 3.52 MIL/uL — ABNORMAL LOW (ref 3.87–5.11)
RDW: 13.7 % (ref 11.5–15.5)
WBC: 4.8 10*3/uL (ref 4.0–10.5)
nRBC: 0 % (ref 0.0–0.2)

## 2018-10-17 LAB — COMPREHENSIVE METABOLIC PANEL
ALT: 24 U/L (ref 0–44)
AST: 34 U/L (ref 15–41)
Albumin: 3.3 g/dL — ABNORMAL LOW (ref 3.5–5.0)
Alkaline Phosphatase: 37 U/L — ABNORMAL LOW (ref 38–126)
Anion gap: 8 (ref 5–15)
BUN: 20 mg/dL (ref 8–23)
CO2: 23 mmol/L (ref 22–32)
Calcium: 9 mg/dL (ref 8.9–10.3)
Chloride: 109 mmol/L (ref 98–111)
Creatinine, Ser: 0.8 mg/dL (ref 0.44–1.00)
GFR calc Af Amer: >60 mL/min (ref >60)
GFR calc non Af Amer: >60 mL/min (ref >60)
Glucose, Bld: 98 mg/dL (ref 70–99)
Potassium: 4.2 mmol/L (ref 3.5–5.1)
Sodium: 140 mmol/L (ref 135–145)
Total Bilirubin: 0.5 mg/dL (ref 0.3–1.2)
Total Protein: 6.1 g/dL — ABNORMAL LOW (ref 6.5–8.1)

## 2018-10-17 LAB — TROPONIN I (HIGH SENSITIVITY)
Troponin I (High Sensitivity): 5 ng/L (ref <18)
Troponin I (High Sensitivity): 4 ng/L (ref <18)

## 2018-10-17 LAB — PROTIME-INR
INR: 2 — ABNORMAL HIGH (ref 0.8–1.2)
Prothrombin Time: 22.5 seconds — ABNORMAL HIGH (ref 11.4–15.2)

## 2018-10-17 NOTE — Discharge Instructions (Addendum)
You will need to follow-up with your doctor and cardiologist as soon as possible.  If you have any changes or worsening in your condition you will need to return here.  Your heart enzyme test here today were normal.

## 2018-10-17 NOTE — ED Notes (Signed)
Pt given sandwich and drink per MD Maryan Rued

## 2018-10-17 NOTE — ED Notes (Signed)
Patient verbalizes understanding of discharge instructions. Opportunity for questioning and answers were provided. Armband removed by staff, pt discharged from ED ambulatory to home.  

## 2018-10-17 NOTE — ED Triage Notes (Signed)
[facility]- pt here for left sided chest pain since 0600 this morning. Pt has a history of a left sided mastectomy. Pt received 324 ASA and 1 nitro. Pt also has history of hypertension  164/103 BP

## 2018-10-17 NOTE — Progress Notes (Signed)
   Subjective:    Patient ID: Andrea Simmons, female    DOB: Aug 01, 1940, 78 y.o.   MRN: [id number]  HPI CP- L sided chest pressure, woke her from sleep this AM.  No nausea.  No SOB.  Reports she has similar sxs 'but it's a been a good while'.  Typically pain resolves within 20 minutes but today pain has persisted for 3.5 hrs.  Still present 'but less intense'.  Pressure currently a '1 or 2' but was 'a 7-8' when it woke her up.  Risk factors include DM, HTN, Hyperlipidemia.  L arm tingling.  Denies N/V, fatigue.  Speaking normally, laughing.   Review of Systems For ROS see HPI     Objective:   Physical Exam Vitals signs reviewed.  Constitutional:      General: She is not in acute distress.    Appearance: She is well-developed. She is not ill-appearing or diaphoretic.  HENT:     Head: Normocephalic and atraumatic.  Eyes:     Conjunctiva/sclera: Conjunctivae normal.     Pupils: Pupils are equal, round, and reactive to light.  Neck:     Musculoskeletal: Normal range of motion and neck supple.     Thyroid: No thyromegaly.  Cardiovascular:     Rate and Rhythm: Normal rate and regular rhythm.     Heart sounds: Normal heart sounds. No murmur.  Pulmonary:     Effort: Pulmonary effort is normal. No respiratory distress.     Breath sounds: Normal breath sounds.  Abdominal:     General: There is no distension.     Palpations: Abdomen is soft.     Tenderness: There is no abdominal tenderness.  Lymphadenopathy:     Cervical: No cervical adenopathy.  Skin:    General: Skin is warm and dry.  Neurological:     Mental Status: She is alert and oriented to person, place, and time.  Psychiatric:        Mood and Affect: Mood normal.        Behavior: Behavior normal.           Assessment & Plan:  CP- new.  Woke pt from sleep this AM w/ chest pressure of 7-8/10.  Denies SOB, N/V, dizziness, diaphoresis.  Pressure is now a 1-2/10 but has pain and numbness radiating to L arm.  Has multiple  cardiac risk factors- age, DM, HTN, hyperlipidemia.  On Coumadin so ASA not given.  Pt placed on 2L O2 via [place] while EMS called.  EKG unchanged from June 2018.  Pt to go to hospital for complete cardiac work up.  Pt and husband agreeable.

## 2018-10-17 NOTE — ED Provider Notes (Signed)
[facility] EMERGENCY DEPARTMENT Provider Note   CSN: [id number] Arrival date & time: 10/17/18  1028     History   Chief Complaint No chief complaint on file.   HPI Andrea Simmons is a 78 y.o. female.     HPI Patient presents to the emergency department with chest pain that started this morning.  Patient states she was at her doctor's office when they sent her here for further evaluation.  Patient does have diabetes high blood pressure and has had a history of pulmonary embolus.  Patient states she initially rated the pain 8 out of 10.  Patient states the pain gradually decreased over the next few hours.  Patient states the pain was constant.  The patient states that he did have some pressure in the left upper arm as well.  Patient states that she did not have any shortness of breath or diaphoresis.  Patient denied any other associated symptoms.  The patient denies shortness of breath, headache,blurred vision, neck pain, fever, cough, weakness, numbness, dizziness, anorexia, edema, abdominal pain, nausea, vomiting, diarrhea, rash, back pain, dysuria, hematemesis, bloody stool, near syncope, or syncope. Past Medical History:  Diagnosis Date  . Anemia   . Anxiety   . Breast cancer ([place])   . Depression   . Diabetes mellitus type II   . Fibromyalgia   . GERD (gastroesophageal reflux disease)   . Hyperlipidemia   . Hypertension   . Malignant neoplasm of breast (female), unspecified site 1993   L breast s/p mastectomy and tamoxifen x 45yrs  . 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 Unspecified hypothyroidism     Patient Active Problem List   Diagnosis Date Noted  . Hx of pulmonary embolus 03/30/2017  . Physical exam 11/30/2016  . Hyperlipidemia 12/26/2013  . Encounter for therapeutic drug monitoring 04/16/2013  . 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 Long term (current) use of  anticoagulants 06/30/2010  . Exertional dyspnea 11/09/2008  . Diabetes type 2, controlled ([place]) 08/27/2008  . ANEMIA-NOS 08/27/2008  . Seasonal and perennial allergic rhinitis 08/27/2008  . PULMONARY NODULE 08/27/2008  . Fibromyalgia 08/27/2008  . ADENOCARCINOMA, BREAST 03/20/2007  . Hypothyroidism 03/20/2007  . Essential hypertension 03/20/2007  . Recurrent pulmonary emboli ([place]) 03/20/2007  . GERD 03/20/2007  . ESOPHAGEAL STRICTURE 03/08/2007    Past Surgical History:  Procedure Laterality Date  . APPENDECTOMY    . BREAST BIOPSY Left 1993  . BREAST BIOPSY Right 09/25/2014   stero. Benign  . BREAST RECONSTRUCTION Left   . CATARACT EXTRACTION    . MASTECTOMY Left   . ROTATOR CUFF REPAIR    . SPINAL FUSION      x 2  . TOTAL ABDOMINAL HYSTERECTOMY W/ BILATERAL SALPINGOOPHORECTOMY    . TUBAL LIGATION       OB History   No obstetric history on file.      Home Medications    Prior to Admission medications   Medication Sig Start Date End Date Taking? Authorizing Provider  azelastine (OPTIVAR) 0.05 % ophthalmic solution Place 1 drop into both eyes 2 (two) times daily. 08/26/18   Brunetta Jeans, PA-C  BIOTIN PO Take by mouth.    [provider]  Cholecalciferol (VITAMIN D3) 3000 units TABS Take by mouth.    [provider]  Choline Fenofibrate 135 MG capsule Take 135 mg by mouth daily.  [provider]  DULoxetine (CYMBALTA) 60 MG capsule TAKE 1 CAPSULE BY MOUTH TWICE A DAY 07/30/18   [name], Billey Co., MD  Ferrous Sulfate (IRON) 325 (65 Fe) MG TABS Take by mouth.    [provider]  furosemide (LASIX) 20 MG tablet Take 20 mg by mouth as needed.  05/09/18   [provider]  levothyroxine (SYNTHROID, LEVOTHROID) 50 MCG tablet Take 50 mcg by mouth daily before breakfast.    [provider]  Magnesium 500 MG TABS Take by mouth.    [provider]  metFORMIN (GLUCOPHAGE-XR) 500 MG 24 hr tablet Take 500 mg by  mouth 2 (two) times daily.  03/23/16   [provider]  methylphenidate (RITALIN) 20 MG tablet Take 1 tablet (20 mg total) by mouth 2 (two) times daily. 11/13/18   [name], Billey Co., MD  Multiple Vitamin (MULTIVITAMIN) tablet Take 1 tablet by mouth daily.      [provider]  Omega-3 Fatty Acids (FISH OIL) 500 MG CAPS Take 1 capsule by mouth 2 (two) times daily.     [provider]  omeprazole (PRILOSEC) 20 MG capsule TAKE 1 CAPSULE (20 MG TOTAL) BY MOUTH 2 (TWO) TIMES DAILY BEFORE A MEAL. 01/29/18   Tanda Rockers, MD  ONE TOUCH ULTRA TEST test strip CHECK BLOOD SUGAR TWICE DAILY 07/11/18   [name], MD  [facility] DELICA LANCETS 62G MISC USE AS DIRECTED TWICE DAILY 10/10/17   Brunetta Jeans, PA-C  RESTASIS MULTIDOSE 0.05 % ophthalmic emulsion INSTILL 1 DROP BY OPHTHALMIC ROUTE EVERY 12 HOURS INTO AFFECTED EYE(S) 06/21/16   [provider]  rosuvastatin (CRESTOR) 20 MG tablet Take 10 mg by mouth daily.     [provider]  tiZANidine (ZANAFLEX) 2 MG tablet TAKE 1 TABLET BY MOUTH 2 TIMES DAILY AS NEEDED. NO ADDITIONAL REFILLS UNTIL CPE IN APRIL Patient taking differently: 2 mg at bedtime.  08/15/18   [name], MD  valsartan-hydrochlorothiazide (DIOVAN-HCT) 320-25 MG tablet TAKE 1 TABLET BY MOUTH EVERY DAY 03/21/18   [name], MD  warfarin (COUMADIN) 5 MG tablet TAKE AS DIRECTED BY COUMADIN CLINIC. Patient taking differently: 5mg  on Sunday, 2.5mg  All other days 07/04/18   [name], MD    Family History Family History  Problem Relation Age of Onset  . Depression Other        Parent  . Arthritis Other        Parent, Grandparent  . Hypertension Other        Grandparent  . Hyperlipidemia Other        [place]  . Miscarriages / Stillbirths Other        Grandparent  . Stroke Other        [facility]  . Cancer Maternal Uncle        prostate  . Hypertension Maternal Grandfather   . Breast cancer Cousin   . Breast cancer  Cousin     Social History Social History   Tobacco Use  . Smoking status: Never Smoker  . Smokeless tobacco: Never Used  Substance Use Topics  . Alcohol use: No    Alcohol/week: 0.0 standard drinks  . Drug use: No     Allergies   Anaprox [naproxen sodium], Lipitor [atorvastatin], Meperidine, Meperidine hcl, Morphine, and Naproxen sodium   Review of Systems Review of Systems All other systems negative except as documented in the HPI. All pertinent positives and negatives as reviewed in the HPI.  Physical Exam Updated Vital Signs BP (!) 148/71 (BP Location: Right Arm)   Pulse (!) 50   Temp 98.4 F (36.9 C) (Oral)   Resp 17   Ht 5\' 4"  (1.626 m)   Wt 65.8 kg   SpO2 100%   BMI 24.89 kg/m   Physical Exam Vitals signs and nursing note reviewed.  Constitutional:      General: She is not in acute distress.    Appearance: She is well-developed.  HENT:     Head: Normocephalic and atraumatic.  Eyes:     Pupils: Pupils are equal, round, and reactive to light.  Neck:     Musculoskeletal: Normal range of motion and neck supple.  Cardiovascular:     Rate and Rhythm: Normal rate and regular rhythm.     Heart sounds: Normal heart sounds. No murmur. No friction rub. No gallop.   Pulmonary:     Effort: Pulmonary effort is normal. No respiratory distress.     Breath sounds: Normal breath sounds. No wheezing.  Abdominal:     General: Bowel sounds are normal. There is no distension.     Palpations: Abdomen is soft.     Tenderness: There is no abdominal tenderness.  Skin:    General: Skin is warm and dry.     Capillary Refill: Capillary refill takes less than 2 seconds.     Findings: No erythema or rash.  Neurological:     Mental Status: She is alert and oriented to person, place, and time.     Motor: No abnormal muscle tone.     Coordination: Coordination normal.  Psychiatric:        Behavior: Behavior normal.      ED Treatments / Results  Labs (all labs ordered are  listed, but only abnormal results are displayed) Labs Reviewed  COMPREHENSIVE METABOLIC PANEL - Abnormal; Notable for the following components:      Result Value   Total Protein 6.1 (*)    Albumin 3.3 (*)    Alkaline Phosphatase 37 (*)    All other components within normal limits  CBC WITH DIFFERENTIAL/PLATELET - Abnormal; Notable for the following components:   RBC 3.52 (*)    Hemoglobin 10.7 (*)    HCT 33.6 (*)    All other components within normal limits  PROTIME-INR - Abnormal; Notable for the following components:   Prothrombin Time 22.5 (*)    INR 2.0 (*)    All other components within normal limits  TROPONIN I (HIGH SENSITIVITY)  TROPONIN I (HIGH SENSITIVITY)    EKG EKG Interpretation  Date/Time:  Thursday October 17 2018 10:32:04 EDT Ventricular Rate:  51 PR Interval:    QRS Duration: 160 QT Interval:  489 QTC Calculation: 451 R Axis:   -67 Text Interpretation:  Sinus rhythm RBBB and LAFB No significant change since last tracing Confirmed by Blanchie Dessert [phone]) on 10/17/2018 10:35:17 AM Also confirmed by Blanchie Dessert [phone]), editor Philomena Doheny [phone])  on 10/17/2018 11:59:54 AM   Radiology Dg Chest 2 View  Result Date: 10/17/2018 CLINICAL DATA:  78 year old female with left side chest pain since 0600 hours. EXAM: CHEST - 2 VIEW COMPARISON:  Chest radiographs 11/20/2017 and earlier. FINDINGS: Lung volumes and mediastinal contours remain normal. Visualized tracheal air column is within normal limits. The lungs appear stable in clear. No pneumothorax or pleural effusion. Chronic postoperative changes with surgical clips to the left chest wall, right upper quadrant. Chronic lower cervical spine cerclage wire. No acute osseous abnormality  identified. Negative visible bowel gas pattern. IMPRESSION: Stable.  No cardiopulmonary abnormality. Electronically Signed   By: Genevie Ann M.D.   On: 10/17/2018 11:57    Procedures Procedures (including critical care time)   Medications Ordered in ED Medications - No data to display   Initial Impression / Assessment and Plan / ED Course  I have reviewed the triage vital signs and the nursing notes.  Pertinent labs & imaging results that were available during my care of the patient were reviewed by me and considered in my medical decision making (see chart for details).  Clinical Course as of Oct 16 1432  Thu Oct 17, 2018  1302 Troponin I (High Sensitivity): 5 [AP]    Clinical Course User Index [AP] Crista Curb       SPoke with her PCP as well.  Patient's 2 troponins were negative.  Patient will be discharged home with close follow-up with her doctor and cardiologist.  Patient had a shared decision making on this process and feels comfortable with this.  She prefers to be discharged home as well.  I did advise her that if her condition changes or worsens you need to return here.  Final Clinical Impressions(s) / ED Diagnoses   Final diagnoses:  None    ED Discharge Orders    None       Dalia Heading, PA-C 10/17/18 1441    Blanchie Dessert, MD 10/19/18 [phone]

## 2018-10-18 ENCOUNTER — Telehealth: Payer: Self-pay | Admitting: General Practice

## 2018-10-18 NOTE — Telephone Encounter (Signed)
Please advise, front desk was asking where would be best place to schedule patient for hospital follow up?

## 2018-10-18 NOTE — Telephone Encounter (Signed)
Monday at 3:30

## 2018-10-18 NOTE — Telephone Encounter (Signed)
Pt was scheduled for Monday afternoon.

## 2018-10-21 ENCOUNTER — Other Ambulatory Visit: Payer: Self-pay

## 2018-10-21 ENCOUNTER — Encounter: Payer: Self-pay | Admitting: Family Medicine

## 2018-10-21 ENCOUNTER — Ambulatory Visit (INDEPENDENT_AMBULATORY_CARE_PROVIDER_SITE_OTHER): Payer: Medicare Other | Admitting: Family Medicine

## 2018-10-21 VITALS — BP 135/76 | HR 45 | Temp 98.1°F | Resp 16 | Ht 64.0 in | Wt 144.0 lb

## 2018-10-21 DIAGNOSIS — E119 Type 2 diabetes mellitus without complications: Secondary | ICD-10-CM | POA: Diagnosis not present

## 2018-10-21 DIAGNOSIS — R079 Chest pain, unspecified: Secondary | ICD-10-CM | POA: Diagnosis not present

## 2018-10-21 NOTE — Patient Instructions (Addendum)
Schedule your complete physical at your convenience Please have the Cardiologist send me a copy of your note Call with any questions or concerns Stay Safe!!

## 2018-10-21 NOTE — Assessment & Plan Note (Signed)
Chronic problem.  Following w/ Dr Chalmers Cater.  Eye exam done on Friday- WNL.  Foot exam done today.  Will follow along and assist as able.

## 2018-10-21 NOTE — Progress Notes (Signed)
   Subjective:    Patient ID: Andrea Simmons, female    DOB: 1940/11/01, 78 y.o.   MRN: [id number]  HPI ER f/u- pt was sent to ER on 7/30 after arriving for CPE w/ L sided chest pressure and radiation to L arm.  Troponins were negative.  EKG unchanged.  Pt reports CP is 'much better' but 'yesterday AM it woke me up about 1am'.  Has not bothered her since.  Denies indigestion or heartburn.  On Omeprazole 20mg  daily.  Pt reports CP is worse the more activity she does.  Pt feels it is more musculoskeletal than ischemia.  Has appt w/ Cards on Monday.  No SOB, HAs, edema.  Did med rec w/ pt based off ER d/c summary.  DM- chronic problem, on Metformin BID.  Following w/ Dr Chalmers Cater.  On ARB for renal protection.  Due for foot exam.  Eye exam done 7/31- WNL.  Denies SOB, HAs, visual changes, edema.  Denies abd pain, N/V.   Review of Systems For ROS see HPI     Objective:   Physical Exam Vitals signs reviewed.  Constitutional:      General: She is not in acute distress.    Appearance: Normal appearance. She is well-developed.  HENT:     Head: Normocephalic and atraumatic.  Eyes:     Conjunctiva/sclera: Conjunctivae normal.     Pupils: Pupils are equal, round, and reactive to light.  Neck:     Musculoskeletal: Normal range of motion and neck supple.     Thyroid: No thyromegaly.  Cardiovascular:     Rate and Rhythm: Normal rate and regular rhythm.     Heart sounds: Normal heart sounds. No murmur.  Pulmonary:     Effort: Pulmonary effort is normal. No respiratory distress.     Breath sounds: Normal breath sounds.  Abdominal:     General: There is no distension.     Palpations: Abdomen is soft.     Tenderness: There is no abdominal tenderness.  Lymphadenopathy:     Cervical: No cervical adenopathy.  Skin:    General: Skin is warm and dry.  Neurological:     Mental Status: She is alert and oriented to person, place, and time.  Psychiatric:        Behavior: Behavior normal.          Assessment & Plan:  CP- thankfully this has resolved.  Did have 1 episode that woke her from sleep the other night but it hasn't bothered her since.  She feels this is musculoskeletal and due to her previous mastectomy.  Has Cards f/u scheduled for Monday.  Since she is currently asymptomatic, no labs at this time.  Reviewed red flags that should prompt immediate attention.  Will follow.

## 2018-10-28 ENCOUNTER — Other Ambulatory Visit: Payer: Self-pay | Admitting: Psychiatry

## 2018-10-28 ENCOUNTER — Ambulatory Visit (INDEPENDENT_AMBULATORY_CARE_PROVIDER_SITE_OTHER): Payer: Medicare Other | Admitting: Cardiology

## 2018-10-28 ENCOUNTER — Other Ambulatory Visit: Payer: Self-pay

## 2018-10-28 ENCOUNTER — Encounter: Payer: Self-pay | Admitting: Cardiology

## 2018-10-28 VITALS — BP 139/96 | HR 87 | Temp 97.8°F | Ht 64.0 in | Wt 145.0 lb

## 2018-10-28 DIAGNOSIS — R0789 Other chest pain: Secondary | ICD-10-CM

## 2018-10-28 DIAGNOSIS — R079 Chest pain, unspecified: Secondary | ICD-10-CM

## 2018-10-28 NOTE — Telephone Encounter (Signed)
Is she not to suppose to be taking vitamin D?

## 2018-10-28 NOTE — Progress Notes (Signed)
Follow up visit  Subjective:   Andrea Simmons, female    DOB: Sep 30, 1940, 78 y.o.   MRN: [id number]   Chief Complaint  Patient presents with  . 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 This occurred while at PCP office. Pain was in left upper chest area, focal, was present at rest, unrelated to exertion. Pain was resolved on her way to the ED. Serial HS trop were negative. Patient denies any shortness of breath associated with it. Since then, patient has had one more such episode of short lasting, self limiting chest pain that was brought on by certain overhead movement. She denies nay exertional chest pain.  Past Medical History:  Diagnosis Date  . Anemia   . Anxiety   . Breast cancer ([place])   . Depression   . Diabetes mellitus type II   . Fibromyalgia   . GERD (gastroesophageal reflux disease)   . Hyperlipidemia   . Hypertension   . Malignant neoplasm of breast (female), unspecified site 1993   L breast s/p mastectomy and tamoxifen x 30yrs  . 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 Unspecified hypothyroidism      Past Surgical History:  Procedure Laterality Date  . APPENDECTOMY    . BREAST BIOPSY Left 1993  . BREAST BIOPSY Right 09/25/2014   stero. Benign  . BREAST RECONSTRUCTION Left   . CATARACT EXTRACTION    . MASTECTOMY Left   . ROTATOR CUFF REPAIR    . SPINAL FUSION      x 2  . TOTAL ABDOMINAL HYSTERECTOMY W/ BILATERAL SALPINGOOPHORECTOMY    . TUBAL LIGATION      = Social History   Socioeconomic History  . Marital status: Married    Spouse name: Not on file  . Number of children: 1  . Years of education: Not on file   . Highest education level: Not on file  Occupational History  . Occupation: Armed forces technical officer: RETIRED  . Occupation: Emergency planning/management officer  . Occupation: school bus driver  Social Needs  . Financial resource strain: Not on file  . Food insecurity    Worry: Not on file    Inability: Not on file  . Transportation needs    Medical: Not on file    Non-medical: Not on file  Tobacco Use  . Smoking status: Never Smoker  . Smokeless tobacco: Never Used  Substance and Sexual Activity  . Alcohol use: No    Alcohol/week: 0.0 standard drinks  . Drug use: No  . Sexual activity: Not on file  Lifestyle  . Physical activity    Days per week: Not on file    Minutes per session: Not on file  . Stress: Not on file  Relationships  . Social Herbalist on phone: Not on file    Gets together: Not on file    Attends religious service: Not on file    Active member of club or organization: Not on file    Attends meetings of clubs or organizations: Not on file    Relationship status: Not on file  . Intimate partner violence  Fear of current or ex partner: Not on file    Emotionally abused: Not on file    Physically abused: Not on file    Forced sexual activity: Not on file  Other Topics Concern  . Not on file  Social History Narrative  . Not on file     Family History  Problem Relation Age of Onset  . Depression Other        Parent  . Arthritis Other        Parent, Grandparent  . Hypertension Other        Grandparent  . Hyperlipidemia Other        [place]  . Miscarriages / Stillbirths Other        Grandparent  . Stroke Other        [facility]  . Cancer Maternal Uncle        prostate  . Hypertension Maternal Grandfather   . Breast cancer Cousin   . Breast cancer Cousin      Current Outpatient Medications on File Prior to Visit  Medication Sig Dispense Refill  . azelastine (OPTIVAR) 0.05 % ophthalmic solution Place 1 drop into both eyes 2 (two) times daily. 6 mL 12   . BIOTIN PO Take by mouth.    . Cholecalciferol (VITAMIN D3) 3000 units TABS Take by mouth.    . Choline Fenofibrate 135 MG capsule Take 135 mg by mouth daily.      . DULoxetine (CYMBALTA) 60 MG capsule TAKE 1 CAPSULE BY MOUTH TWICE A DAY 180 capsule 0  . Ferrous Sulfate (IRON) 325 (65 Fe) MG TABS Take by mouth.    . furosemide (LASIX) 20 MG tablet Take 20 mg by mouth as needed.     Marland Kitchen levothyroxine (SYNTHROID, LEVOTHROID) 50 MCG tablet Take 50 mcg by mouth daily before breakfast.    . Magnesium 500 MG TABS Take by mouth.    . metFORMIN (GLUCOPHAGE-XR) 500 MG 24 hr tablet Take 500 mg by mouth 2 (two) times daily.   3  . [START ON 11/13/2018] methylphenidate (RITALIN) 20 MG tablet Take 1 tablet (20 mg total) by mouth 2 (two) times daily. 60 tablet 0  . Multiple Vitamin (MULTIVITAMIN) tablet Take 1 tablet by mouth daily.      . Omega-3 Fatty Acids (FISH OIL) 500 MG CAPS Take 1 capsule by mouth 2 (two) times daily.     Marland Kitchen omeprazole (PRILOSEC) 20 MG capsule TAKE 1 CAPSULE (20 MG TOTAL) BY MOUTH 2 (TWO) TIMES DAILY BEFORE A MEAL. 180 capsule 0  . ONE TOUCH ULTRA TEST test strip CHECK BLOOD SUGAR TWICE DAILY 100 each 3  . ONETOUCH DELICA LANCETS 60Y MISC USE AS DIRECTED TWICE DAILY 200 each 3  . RESTASIS MULTIDOSE 0.05 % ophthalmic emulsion INSTILL 1 DROP BY OPHTHALMIC ROUTE EVERY 12 HOURS INTO AFFECTED EYE(S)  6  . rosuvastatin (CRESTOR) 20 MG tablet Take 10 mg by mouth daily.     Marland Kitchen tiZANidine (ZANAFLEX) 2 MG tablet TAKE 1 TABLET BY MOUTH 2 TIMES DAILY AS NEEDED. NO ADDITIONAL REFILLS UNTIL CPE IN APRIL (Patient taking differently: 2 mg at bedtime. ) 180 tablet 0  . valsartan-hydrochlorothiazide (DIOVAN-HCT) 320-25 MG tablet TAKE 1 TABLET BY MOUTH EVERY DAY 90 tablet 2  . warfarin (COUMADIN) 5 MG tablet TAKE AS DIRECTED BY COUMADIN CLINIC. (Patient taking differently: 5mg  on Sunday, 2.5mg  All other days) 90 tablet 1   No current facility-administered medications on file prior to visit.      Cardiovascular  studies:  EKG 10/28/2018: Sinus rhythm 77 bpm. Right bundle branch block  Left anterior fascicular block. Consider RV hypertrophy. No change compared to previous EKG's.  EKG 10/17/2018: Sinus rhythm RBBB and LAFB  Lexiscan myoview stress test 01/30/2018: 1. The resting electrocardiogram demonstrated normal sinus rhythm, LAD, LAFB, RBBB and no resting arrhythmias.  The stress electrocardiogram was non-diagnostic due to pharmacologic stress. Stress symptoms included dyspnea. Resting BP 166/86 and peak BP 202/88 mm Hg.  2. Myocardial perfusion imaging is normal. Overall left ventricular systolic function was normal without regional wall motion abnormalities. The left ventricular ejection fraction was 56%.  This is a low risk study.  Echocardiogram 01/28/2018:  Left ventricle cavity is normal in size. Moderate concentric hypertrophy of the left ventricle. Normal global wall motion. Doppler evidence of grade I (impaired) diastolic dysfunction, normal LAP. Calculated EF 55%. Left atrial cavity is mildly dilated. Mild (Grade I) aortic regurgitation. Mild (Grade I) mitral regrgitation. Trace tricuspid regurgitation. Inadequate tricuspid regurgitation jet to estimate pulmonary artery pressure. Normal right atrial pressure.  Recent labs: Results for Andrea Simmons, Andrea Simmons (MRN [id number]) as of 10/29/2018 05:35  Ref. Range 10/17/2018 12:04 10/17/2018 13:43  COMPREHENSIVE METABOLIC PANEL Unknown Rpt (A)   Sodium Latest Ref Range: 135 - 145 mmol/L 140   Potassium Latest Ref Range: 3.5 - 5.1 mmol/L 4.2   Chloride Latest Ref Range: 98 - 111 mmol/L 109   CO2 Latest Ref Range: 22 - 32 mmol/L 23   Glucose Latest Ref Range: 70 - 99 mg/dL 98   BUN Latest Ref Range: 8 - 23 mg/dL 20   Creatinine Latest Ref Range: 0.44 - 1.00 mg/dL 0.80   Calcium Latest Ref Range: 8.9 - 10.3 mg/dL 9.0   Anion gap Latest Ref Range: 5 - 15  8   Alkaline Phosphatase Latest Ref Range: 38 - 126 U/L 37 (L)   Albumin  Latest Ref Range: 3.5 - 5.0 g/dL 3.3 (L)   AST Latest Ref Range: 15 - 41 U/L 34   ALT Latest Ref Range: 0 - 44 U/L 24   Total Protein Latest Ref Range: 6.5 - 8.1 g/dL 6.1 (L)   Total Bilirubin Latest Ref Range: 0.3 - 1.2 mg/dL 0.5   GFR, Est Non African American Latest Ref Range: >60 mL/min >60   GFR, Est African American Latest Ref Range: >60 mL/min >60   Troponin I (High Sensitivity) Latest Ref Range: <18 ng/L 5 4   Review of Systems  Constitution: Negative for decreased appetite, malaise/fatigue, weight gain and weight loss.  HENT: Negative for congestion.   Eyes: Negative for visual disturbance.  Cardiovascular: Negative for chest pain, dyspnea on exertion, leg swelling, palpitations and syncope.  Respiratory: Negative for cough.   Endocrine: Negative for cold intolerance.  Hematologic/Lymphatic: Does not bruise/bleed easily.  Skin: Negative for itching and rash.  Musculoskeletal: Negative for myalgias.  Gastrointestinal: Negative for abdominal pain, nausea and vomiting.  Genitourinary: Negative for dysuria.  Neurological: Negative for dizziness and weakness.  Psychiatric/Behavioral: The patient is not nervous/anxious.   All other systems reviewed and are negative.       Vitals:   10/28/18 1611  BP: (!) 139/96  Pulse: 87  Temp: 97.8 F (36.6 C)  SpO2: 98%    Body mass index is 24.89 kg/m. Filed Weights   10/28/18 1611  Weight: 145 lb (65.8 kg)     Objective:   Physical Exam  Constitutional: She is oriented to person, place, and time. She appears well-developed and well-nourished. No  distress.  HENT:  Head: Normocephalic and atraumatic.  Eyes: Pupils are equal, round, and reactive to light. Conjunctivae are normal.  Neck: No JVD present.  Cardiovascular: Normal rate, regular rhythm and intact distal pulses.  Pulmonary/Chest: Effort normal and breath sounds normal. She has no wheezes. She has no rales.  Abdominal: Soft. Bowel sounds are normal. There is no  rebound.  Musculoskeletal:        General: No edema.  Lymphadenopathy:    She has no cervical adenopathy.  Neurological: She is alert and oriented to person, place, and time. No cranial nerve deficit.  Skin: Skin is warm and dry.  Psychiatric: She has a normal mood and affect.  Nursing note and vitals reviewed.         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 MI was ruled out in the ED. Although she has h.o DVT, suspicion for PE is low. She has had a low risk stress test within past one year. Recommend watchhul monitoring for now. No further investigations necessary at this time, I will see her back in a few weeks for follow up.   Nigel Mormon, MD [facility] [place] Pager: [phone] Office: [phone] If no answer Cell [phone]

## 2018-10-29 ENCOUNTER — Encounter: Payer: Self-pay | Admitting: Cardiology

## 2018-10-31 ENCOUNTER — Telehealth: Payer: Self-pay

## 2018-10-31 ENCOUNTER — Ambulatory Visit: Payer: Medicare Other | Admitting: Family Medicine

## 2018-10-31 DIAGNOSIS — R079 Chest pain, unspecified: Secondary | ICD-10-CM

## 2018-10-31 MED ORDER — NITROGLYCERIN 0.4 MG SL SUBL: 0.4000 mg | SUBLINGUAL_TABLET | SUBLINGUAL | 3 refills | Status: DC | PRN

## 2018-10-31 NOTE — Telephone Encounter (Signed)
Sent now

## 2018-10-31 NOTE — Telephone Encounter (Signed)
Telephone encounter:Reason for call:  pt asked for nitroglycerin, she said you said you would  Send her in some    Usual provider: MP   Last office visit: 10/28/18   Next office visit: 12/25/18   Last hospitalization: 10/17/18   Current Outpatient Medications on File Prior to Visit  Medication Sig Dispense Refill  . azelastine (OPTIVAR) 0.05 % ophthalmic solution Place 1 drop into both eyes 2 (two) times daily. (Patient not taking: Reported on 10/28/2018) 6 mL 12  . BIOTIN PO Take by mouth.    . Cholecalciferol (VITAMIN D3) 3000 units TABS Take by mouth.    . Choline Fenofibrate 135 MG capsule Take 135 mg by mouth daily.      . DULoxetine (CYMBALTA) 60 MG capsule TAKE 1 CAPSULE BY MOUTH TWICE A DAY 180 capsule 0  . Ferrous Sulfate (IRON) 325 (65 Fe) MG TABS Take by mouth.    . furosemide (LASIX) 20 MG tablet Take 20 mg by mouth as needed.     Marland Kitchen levothyroxine (SYNTHROID, LEVOTHROID) 50 MCG tablet Take 50 mcg by mouth daily before breakfast.    . Magnesium 500 MG TABS Take by mouth.    . metFORMIN (GLUCOPHAGE-XR) 500 MG 24 hr tablet Take 1,000 mg by mouth 2 (two) times daily.   3  . [START ON 11/13/2018] methylphenidate (RITALIN) 20 MG tablet Take 1 tablet (20 mg total) by mouth 2 (two) times daily. 60 tablet 0  . Multiple Vitamin (MULTIVITAMIN) tablet Take 1 tablet by mouth daily.      . Omega-3 Fatty Acids (FISH OIL) 500 MG CAPS Take 1 capsule by mouth 2 (two) times daily.     Marland Kitchen omeprazole (PRILOSEC) 20 MG capsule TAKE 1 CAPSULE (20 MG TOTAL) BY MOUTH 2 (TWO) TIMES DAILY BEFORE A MEAL. (Patient taking differently: Take 20 mg by mouth daily. ) 180 capsule 0  . ONE TOUCH ULTRA TEST test strip CHECK BLOOD SUGAR TWICE DAILY 100 each 3  . ONETOUCH DELICA LANCETS 44W MISC USE AS DIRECTED TWICE DAILY 200 each 3  . RESTASIS MULTIDOSE 0.05 % ophthalmic emulsion INSTILL 1 DROP BY OPHTHALMIC ROUTE EVERY 12 HOURS INTO AFFECTED EYE(S)  6  . rosuvastatin (CRESTOR) 20 MG tablet Take 10 mg by mouth  daily.     Marland Kitchen tiZANidine (ZANAFLEX) 2 MG tablet TAKE 1 TABLET BY MOUTH 2 TIMES DAILY AS NEEDED. NO ADDITIONAL REFILLS UNTIL CPE IN APRIL (Patient taking differently: 2 mg at bedtime. ) 180 tablet 0  . valsartan-hydrochlorothiazide (DIOVAN-HCT) 320-25 MG tablet TAKE 1 TABLET BY MOUTH EVERY DAY 90 tablet 2  . warfarin (COUMADIN) 5 MG tablet TAKE AS DIRECTED BY COUMADIN CLINIC. (Patient taking differently: 5mg  on Sunday, 2.5mg  All other days) 90 tablet 1   No current facility-administered medications on file prior to visit.

## 2018-11-06 ENCOUNTER — Other Ambulatory Visit: Payer: Self-pay

## 2018-11-06 ENCOUNTER — Ambulatory Visit (INDEPENDENT_AMBULATORY_CARE_PROVIDER_SITE_OTHER): Payer: Medicare Other | Admitting: General Practice

## 2018-11-06 DIAGNOSIS — Z7901 Long term (current) use of anticoagulants: Secondary | ICD-10-CM | POA: Diagnosis not present

## 2018-11-06 DIAGNOSIS — I2699 Other pulmonary embolism without acute cor pulmonale: Secondary | ICD-10-CM

## 2018-11-06 LAB — POCT INR: INR: 1.9 — AB (ref 2.0–3.0)

## 2018-11-06 NOTE — Progress Notes (Signed)
I have reviewed the results and agree with this plan   

## 2018-11-06 NOTE — Patient Instructions (Addendum)
Pre visit review using our clinic review tool, if applicable. No additional management support is needed unless otherwise documented below in the visit note.  Change dosage and take 1/2 tablet daily except 1 tablet on Sundays and Wednesdays.  Re-check in 4 weeks.

## 2018-11-15 ENCOUNTER — Other Ambulatory Visit: Payer: Self-pay | Admitting: Psychiatry

## 2018-11-16 ENCOUNTER — Other Ambulatory Visit: Payer: Self-pay | Admitting: Family Medicine

## 2018-11-28 ENCOUNTER — Other Ambulatory Visit: Payer: Self-pay

## 2018-11-28 ENCOUNTER — Telehealth: Payer: Self-pay | Admitting: Psychiatry

## 2018-11-28 DIAGNOSIS — F331 Major depressive disorder, recurrent, moderate: Secondary | ICD-10-CM

## 2018-11-28 NOTE — Telephone Encounter (Signed)
Pt is requesting RF of her Ritalin. Has upcoming appt 9/16. Will run out Sunday. [facility] [place]

## 2018-11-28 NOTE — Telephone Encounter (Signed)
Last refill 07/29 Pended for approval

## 2018-11-29 MED ORDER — METHYLPHENIDATE HCL 20 MG PO TABS: 20.0000 mg | ORAL_TABLET | Freq: Two times a day (BID) | ORAL | 0 refills | Status: DC

## 2018-12-02 ENCOUNTER — Other Ambulatory Visit: Payer: Self-pay

## 2018-12-02 ENCOUNTER — Ambulatory Visit (INDEPENDENT_AMBULATORY_CARE_PROVIDER_SITE_OTHER): Payer: Medicare Other | Admitting: General Practice

## 2018-12-02 DIAGNOSIS — Z7901 Long term (current) use of anticoagulants: Secondary | ICD-10-CM | POA: Diagnosis not present

## 2018-12-02 DIAGNOSIS — I2699 Other pulmonary embolism without acute cor pulmonale: Secondary | ICD-10-CM

## 2018-12-02 LAB — POCT INR
INR: 2.5 (ref 2.0–3.0)
INR: 3 (ref 2.0–3.0)

## 2018-12-02 NOTE — Patient Instructions (Addendum)
Pre visit review using our clinic review tool, if applicable. No additional management support is needed unless otherwise documented below in the visit note.  Hold dosage today (9/14) and then continue to take 1/2 tablet daily except 1 tablet on Sundays and Wednesdays.  Re-check in 4 weeks.

## 2018-12-04 ENCOUNTER — Encounter: Payer: Self-pay | Admitting: Psychiatry

## 2018-12-04 ENCOUNTER — Ambulatory Visit (INDEPENDENT_AMBULATORY_CARE_PROVIDER_SITE_OTHER): Payer: Medicare Other | Admitting: Psychiatry

## 2018-12-04 ENCOUNTER — Other Ambulatory Visit: Payer: Self-pay

## 2018-12-04 VITALS — BP 141/64 | HR 78

## 2018-12-04 DIAGNOSIS — F411 Generalized anxiety disorder: Secondary | ICD-10-CM

## 2018-12-04 DIAGNOSIS — F338 Other recurrent depressive disorders: Secondary | ICD-10-CM

## 2018-12-04 DIAGNOSIS — F331 Major depressive disorder, recurrent, moderate: Secondary | ICD-10-CM | POA: Diagnosis not present

## 2018-12-04 DIAGNOSIS — F4001 Agoraphobia with panic disorder: Secondary | ICD-10-CM | POA: Diagnosis not present

## 2018-12-04 DIAGNOSIS — R7989 Other specified abnormal findings of blood chemistry: Secondary | ICD-10-CM

## 2018-12-04 DIAGNOSIS — G4721 Circadian rhythm sleep disorder, delayed sleep phase type: Secondary | ICD-10-CM | POA: Diagnosis not present

## 2018-12-04 MED ORDER — METHYLPHENIDATE HCL 20 MG PO TABS: 20.0000 mg | ORAL_TABLET | Freq: Two times a day (BID) | ORAL | 0 refills | Status: DC

## 2018-12-04 NOTE — Progress Notes (Signed)
Andrea Simmons [id number] 02/24/1941 78 y.o.  Subjective:   Patient ID:  Andrea Simmons is a 78 y.o. (DOB 05/29/1940) female.  Chief Complaint:  Chief Complaint  Patient presents with  . Follow-up     Medication Management  . Depression     Medication Management    Depression        Associated symptoms include no decreased concentration and no suicidal ideas.   Andrea Simmons presents  today for recent exacerbation of depression..    At visit was July 09, 2018.  Vraylar was discontinued for lack of response.  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  This was partially helpful.  When  seen Aug 15, 2018 and Ritalin was increased to 15 mg twice daily because of partial response at the lower dose.  Last visit was at the end of July 2020.  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  The Ritalin was increased again to 20 mg twice daily.  A little better with the increase Ritalin.  NO SE.  Doesn't notice it but mood is better.  H says she's learning again how to fuss.  H in the hospital with cellulitis.  Some worry over him.    Does not feel it kick in and wear off.  Back to old habits of staying up too late, now MN.  Needs 7-8 hours.  Always needed more than others.    I feel good taking it.  Most days feels pretty good.  Can have crying spells without reason even before the Ritalin.   Doing much more than I was.    Energy and interest  and motivation is a littler better but not like she wishes.      Was More talkative on the Abilify and the Wellbutrin.  Episode CP with ER visit in middle of night since here.  Resolved with NTG   Past Psychiatric Medication Trials: Rozerem, zolpidem. Duloxetine 120, Paxil 20 for years, Sertraline, fluoxetine, Wellbutrin, Abilify weight gain and loss benefit, lithium 2004 SE tremor at 600mg  daily. Low dose  nortriptyline, Vraylar NR, Buspar NR.  Review of Systems:  Review of Systems  Constitutional: Positive for unexpected weight change.  Musculoskeletal: Positive for back pain and joint swelling. Negative for neck stiffness.  Neurological: Negative for tremors, weakness and numbness.  Psychiatric/Behavioral: Positive for depression. Negative for agitation, behavioral problems, confusion, decreased concentration, dysphoric mood, hallucinations, self-injury, sleep disturbance and suicidal ideas. The patient is not nervous/anxious and is not hyperactive.    Hx anemia.    Cardiologist said she had a stiff heart.  History of pulmonary embolism and on Coumadin.  Medications: I have reviewed the patient's current medications.  Current Outpatient Medications  Medication Sig Dispense Refill  . BIOTIN PO Take by mouth.    . Cholecalciferol (VITAMIN D3) 3000 units TABS Take by mouth.    . Choline Fenofibrate 135 MG capsule Take 135 mg by mouth daily.      . DULoxetine (CYMBALTA) 60 MG capsule TAKE 1 CAPSULE BY MOUTH TWICE A DAY 180 capsule 0  . Ferrous Sulfate (IRON) 325 (65 Fe) MG TABS Take by mouth.    . furosemide (LASIX) 20 MG tablet Take 20 mg by mouth as needed.     Marland Kitchen levothyroxine (SYNTHROID, LEVOTHROID) 50 MCG tablet Take 50 mcg by mouth daily before breakfast.    .  Magnesium 500 MG TABS Take by mouth.    . metFORMIN (GLUCOPHAGE-XR) 500 MG 24 hr tablet Take 1,000 mg by mouth 2 (two) times daily.   3  . methylphenidate (RITALIN) 20 MG tablet Take 1 tablet (20 mg total) by mouth 2 (two) times daily. 60 tablet 0  . Multiple Vitamin (MULTIVITAMIN) tablet Take 1 tablet by mouth daily.      . nitroGLYCERIN (NITROSTAT) 0.4 MG SL tablet Place 1 tablet (0.4 mg total) under the tongue every 5 (five) minutes as needed for chest pain. 30 tablet 3  . Omega-3 Fatty Acids (FISH OIL) 500 MG CAPS Take 1 capsule by mouth 2 (two) times daily.     Marland Kitchen omeprazole (PRILOSEC) 20 MG capsule TAKE 1 CAPSULE (20 MG  TOTAL) BY MOUTH 2 (TWO) TIMES DAILY BEFORE A MEAL. (Patient taking differently: Take 20 mg by mouth daily. ) 180 capsule 0  . ONE TOUCH ULTRA TEST test strip CHECK BLOOD SUGAR TWICE DAILY 100 each 3  . ONETOUCH DELICA LANCETS 79G MISC USE AS DIRECTED TWICE DAILY 200 each 3  . RESTASIS MULTIDOSE 0.05 % ophthalmic emulsion INSTILL 1 DROP BY OPHTHALMIC ROUTE EVERY 12 HOURS INTO AFFECTED EYE(S)  6  . rosuvastatin (CRESTOR) 20 MG tablet Take 10 mg by mouth daily.     Marland Kitchen tiZANidine (ZANAFLEX) 2 MG tablet TAKE 1 TABLET BY MOUTH TWICE A DAY AS NEEDED 180 tablet 0  . valsartan-hydrochlorothiazide (DIOVAN-HCT) 320-25 MG tablet TAKE 1 TABLET BY MOUTH EVERY DAY 90 tablet 2  . warfarin (COUMADIN) 5 MG tablet TAKE AS DIRECTED BY COUMADIN CLINIC. (Patient taking differently: 5mg  on Sunday, 2.5mg  All other days) 90 tablet 1   No current facility-administered medications for this visit.     Medication Side Effects: None talkative more the [facility]  Allergies:  Allergies  Allergen Reactions  . Anaprox [Naproxen Sodium]   . Lipitor [Atorvastatin]   . Meperidine     Other reaction(s): GI Intolerance  . Meperidine Hcl   . Morphine   . Naproxen Sodium     Other reaction(s): GI Intolerance    Past Medical History:  Diagnosis Date  . Anemia   . Anxiety   . Breast cancer ([place])   . Depression   . Diabetes mellitus type II   . Fibromyalgia   . GERD (gastroesophageal reflux disease)   . Hyperlipidemia   . Hypertension   . Malignant neoplasm of breast (female), unspecified site 1993   L breast s/p mastectomy and tamoxifen x 37yrs  . 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 Unspecified hypothyroidism     Family History  Problem Relation Age of Onset  . Depression Other        Parent  . Arthritis Other        Parent, Grandparent  . Hypertension Other        Grandparent  . Hyperlipidemia Other        [place]  . Miscarriages / Stillbirths Other         Grandparent  . Stroke Other        [facility]  . Cancer Maternal Uncle        prostate  . Hypertension Maternal Grandfather   . Breast cancer Cousin   . Breast cancer Cousin     Social History   Socioeconomic History  . Marital status: Married    Spouse name: Not on file  . Number of children: 1  .  Years of education: Not on file  . Highest education level: Not on file  Occupational History  . Occupation: Armed forces technical officer: RETIRED  . Occupation: Emergency planning/management officer  . Occupation: school bus driver  Social Needs  . Financial resource strain: Not on file  . Food insecurity    Worry: Not on file    Inability: Not on file  . Transportation needs    Medical: Not on file    Non-medical: Not on file  Tobacco Use  . Smoking status: Never Smoker  . Smokeless tobacco: Never Used  Substance and Sexual Activity  . Alcohol use: No    Alcohol/week: 0.0 standard drinks  . Drug use: No  . Sexual activity: Not on file  Lifestyle  . Physical activity    Days per week: Not on file    Minutes per session: Not on file  . Stress: Not on file  Relationships  . Social Herbalist on phone: Not on file    Gets together: Not on file    Attends religious service: Not on file    Active member of club or organization: Not on file    Attends meetings of clubs or organizations: Not on file    Relationship status: Not on file  . Intimate partner violence    Fear of current or ex partner: Not on file    Emotionally abused: Not on file    Physically abused: Not on file    Forced sexual activity: Not on file  Other Topics Concern  . Not on file  Social History Narrative  . Not on file    Past Medical History, Surgical history, Social history, and Family history were reviewed and updated as appropriate.   Please see review of systems for further details on the patient's review from today.   Objective:   Physical Exam:  BP (!) 141/64   Pulse 78   Physical  Exam Constitutional:      General: She is not in acute distress.    Appearance: She is well-developed.  Musculoskeletal:        General: No deformity.  Neurological:     Mental Status: She is alert and oriented to person, place, and time.     Cranial Nerves: No dysarthria.     Coordination: Coordination normal.  Psychiatric:        Attention and Perception: Attention normal. She is attentive.        Mood and Affect: Mood is not anxious or depressed. Affect is not labile, blunt, angry or inappropriate.        Speech: Speech normal.        Behavior: Behavior normal. Behavior is cooperative.        Thought Content: Thought content normal. Thought content is not paranoid or delusional. Thought content does not include homicidal or suicidal ideation. Thought content does not include homicidal or suicidal plan.        Cognition and Memory: Cognition and memory normal.        Judgment: Judgment normal.     Comments: Insight fair-good.  Better mood and close to baseline but not 100%.   Talkative     Lab Review:     Component Value Date/Time   NA 140 10/17/2018 1204   NA 141 08/01/2016 1527   K 4.2 10/17/2018 1204   K 4.6 08/01/2016 1527   CL 109 10/17/2018 1204   CO2 23 10/17/2018 1204   CO2  24 08/01/2016 1527   GLUCOSE 98 10/17/2018 1204   GLUCOSE 93 08/01/2016 1527   BUN 20 10/17/2018 1204   BUN 19.9 08/01/2016 1527   CREATININE 0.80 10/17/2018 1204   CREATININE 0.9 08/01/2016 1527   CALCIUM 9.0 10/17/2018 1204   CALCIUM 9.7 08/01/2016 1527   PROT 6.1 (L) 10/17/2018 1204   PROT 7.2 08/01/2016 1527   PROT 6.8 08/01/2016 1527   ALBUMIN 3.3 (L) 10/17/2018 1204   ALBUMIN 4.0 08/01/2016 1527   AST 34 10/17/2018 1204   AST 41 (H) 08/01/2016 1527   ALT 24 10/17/2018 1204   ALT 25 08/01/2016 1527   ALKPHOS 37 (L) 10/17/2018 1204   ALKPHOS 48 08/01/2016 1527   BILITOT 0.5 10/17/2018 1204   BILITOT 0.33 08/01/2016 1527   GFRNONAA >60 10/17/2018 1204   GFRAA >60 10/17/2018 1204        Component Value Date/Time   WBC 4.8 10/17/2018 1204   RBC 3.52 (L) 10/17/2018 1204   HGB 10.7 (L) 10/17/2018 1204   HGB 9.5 (L) 08/01/2016 1527   HCT 33.6 (L) 10/17/2018 1204   HCT 31.2 (L) 08/01/2016 1527   PLT 197 10/17/2018 1204   PLT 221 08/01/2016 1527   MCV 95.5 10/17/2018 1204   MCV 83.9 08/01/2016 1527   MCH 30.4 10/17/2018 1204   MCHC 31.8 10/17/2018 1204   RDW 13.7 10/17/2018 1204   RDW 17.2 (H) 08/01/2016 1527   LYMPHSABS 1.6 10/17/2018 1204   LYMPHSABS 2.0 08/01/2016 1527   MONOABS 0.5 10/17/2018 1204   MONOABS 0.4 08/01/2016 1527   EOSABS 0.0 10/17/2018 1204   EOSABS 0.1 08/01/2016 1527   BASOSABS 0.0 10/17/2018 1204   BASOSABS 0.0 08/01/2016 1527    No results found for: POCLITH, LITHIUM   No results found for: PHENYTOIN, PHENOBARB, VALPROATE, CBMZ   Endo checked thyroid lately.   Vitamin D level on April 01, 2018 reported as 40.  After that vitamin D 50,000 units weekly was prescribed with a goal of achieving levels in the 50s and 60s. .res Assessment: Plan:    Andrea Simmons was seen today for follow-up and depression.  Diagnoses and all orders for this visit:  Major depressive disorder, recurrent episode, moderate (HCC)  Seasonal depression (HCC)  Panic disorder with agoraphobia  Delayed sleep phase syndrome  Generalized anxiety disorder  Low vitamin D level     Greater than 50% of  30 min face to face time with patient was spent on counseling and coordination of care. We discussed several things.  She is not 100% free of depression she has residual low energy and motivation but her mood is substantially better than it was before.  She is overall satisfied with the medication.  Discussed potential benefits, risks, and side effects of stimulants with patient to include increased heart rate, palpitations, insomnia, increased anxiety, increased irritability, or decreased appetite.  Instructed patient to contact office if experiencing any  significant tolerability issues. We will also follow up the talkativeness to see if it is worse as we increased the Ritalin.  She does not appear manic and not sig different from the last visit.  Continue Ritalin 20 mg twice daily.  Discussed side effects again as noted in the last visit including cardiac risk Hesitate to increase further DT age, etc  Options pramipexole, Rexulti,  change to Trintellixl.  Disc SE and options.    Hesitate to change duloxetine bc likely to affect INR levels.  May ultimately have to do this if we  cannot get a response from the stimulant.  Discussed that it is medically necessary to take the high dose of duloxetine.  She does not have any serotonergic side effects.  Check vitamin D level.  She is off it right now for about a month.  FU 3 mos  Lynder Parents, MD, DFAPA   Please see After Visit Summary for patient specific instructions.  Future Appointments  Date Time Provider [place]  12/25/2018 10:30 AM Nigel Mormon, MD [facility] None  01/01/2019  2:00 PM [facility]  01/10/2019  1:00 PM [name], MD [facility]    No orders of the defined types were placed in this encounter.     -------------------------------

## 2018-12-09 ENCOUNTER — Other Ambulatory Visit: Payer: Self-pay | Admitting: Psychiatry

## 2018-12-09 LAB — VITAMIN D 1,25 DIHYDROXY
Vitamin D 1, 25 (OH)2 Total: 25 pg/mL
Vitamin D2 1, 25 (OH)2: <10 pg/mL
Vitamin D3 1, 25 (OH)2: 21 pg/mL

## 2018-12-09 MED ORDER — VITAMIN D (ERGOCALCIFEROL) 1.25 MG (50000 UNIT) PO CAPS: 50000.0000 [IU] | ORAL_CAPSULE | ORAL | 6 refills | Status: DC

## 2018-12-09 NOTE — Progress Notes (Signed)
Vitamin D level low at 25 after being off of vitamin D supplement for several weeks.  Will restart vitamin D at 50,000 units weekly

## 2018-12-09 NOTE — Progress Notes (Signed)
Pt. Did not answer. Left detailed message on personal VM and asked her to call back with any further questions.

## 2018-12-18 ENCOUNTER — Other Ambulatory Visit: Payer: Self-pay | Admitting: Internal Medicine

## 2018-12-25 ENCOUNTER — Telehealth (INDEPENDENT_AMBULATORY_CARE_PROVIDER_SITE_OTHER): Payer: Medicare Other | Admitting: Cardiology

## 2018-12-25 ENCOUNTER — Encounter: Payer: Self-pay | Admitting: Cardiology

## 2018-12-25 ENCOUNTER — Ambulatory Visit: Payer: Medicare Other | Admitting: Cardiology

## 2018-12-25 ENCOUNTER — Other Ambulatory Visit: Payer: Self-pay

## 2018-12-25 VITALS — BP 145/100 | HR 89 | Ht 64.0 in | Wt 138.0 lb

## 2018-12-25 DIAGNOSIS — R079 Chest pain, unspecified: Secondary | ICD-10-CM | POA: Insufficient documentation

## 2018-12-25 DIAGNOSIS — I10 Essential (primary) hypertension: Secondary | ICD-10-CM | POA: Diagnosis not present

## 2018-12-25 HISTORY — DX: Chest pain, unspecified: R07.9

## 2018-12-25 MED ORDER — AMLODIPINE BESYLATE 5 MG PO TABS: 5.0000 mg | ORAL_TABLET | Freq: Every day | ORAL | 3 refills | Status: DC

## 2018-12-25 NOTE — Progress Notes (Signed)
Follow up visit  Subjective:   Andrea Simmons, female    DOB: Jul 15, 1940, 78 y.o.   MRN: [id number]  I connected with the patient on 12/25/2018 by a telephone call and verified that I am speaking with the correct person using two identifiers.     I offered the patient a video enabled application for a virtual visit. Unfortunately, this could not be accomplished due to technical difficulties/lack of video enabled phone/computer. I discussed the limitations of evaluation and management by telemedicine and the availability of in person appointments. The patient expressed understanding and agreed to proceed.   This visit type was conducted due to national recommendations for restrictions regarding the COVID-19 Pandemic (e.g. social distancing).  This format is felt to be most appropriate for this patient at this time.  All issues noted in this document were discussed and addressed.  No physical exam was performed (except for noted visual exam findings with Tele health visits).  The patient has consented to conduct a Tele health visit and understands insurance will be billed.     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  Since her last visit with me, she has had 3 episodes of chest pain. Episodes occurred at rest and lasted for about 15 min. She has not taken nitroglycerin. Blood pressure is elevated today.   Past Medical History:  Diagnosis Date   Anemia    Anxiety    Breast cancer ([place])    Depression    Diabetes mellitus type II    Fibromyalgia    GERD (gastroesophageal reflux disease)    Hyperlipidemia    Hypertension    Malignant neoplasm of breast (female), unspecified site 1993   L breast s/p mastectomy and tamoxifen x 62yrs   Other pulmonary  embolism and infarction 2008 and 2009   chronic anticoag - [facility]   Unspecified hypothyroidism      Past Surgical History:  Procedure Laterality Date   APPENDECTOMY     BREAST BIOPSY Left 1993   BREAST BIOPSY Right 09/25/2014   stero. Benign   BREAST RECONSTRUCTION Left    CATARACT EXTRACTION     MASTECTOMY Left    ROTATOR CUFF REPAIR     SPINAL FUSION      x 2   TOTAL ABDOMINAL HYSTERECTOMY W/ BILATERAL SALPINGOOPHORECTOMY     TUBAL LIGATION      = Social History   Socioeconomic History   Marital status: Married    Spouse name: Not on file   Number of children: 1   Years of education: Not on file   Highest education level: Not on file  Occupational History   Occupation: Armed forces technical officer: RETIRED   Occupation: Emergency planning/management officer   Occupation: school bus Solicitor strain: Not on file   Food insecurity    Worry: Not on file    Inability: Not on file   Transportation needs    Medical: Not on file    Non-medical: Not on file  Tobacco Use   Smoking status: Never Smoker   Smokeless tobacco: Never Used  Substance and Sexual Activity   Alcohol use: No    Alcohol/week: 0.0 standard drinks   Drug use: No   Sexual activity: Not on file  Lifestyle   Physical activity    Days per week: Not on file    Minutes per session: Not on file   Stress: Not on file  Relationships   Social connections    Talks on phone: Not on file    Gets together: Not on file    Attends religious service: Not on file    Active member of club or organization: Not on file    Attends meetings of clubs or organizations: Not on file    Relationship status: Not on file   Intimate partner violence    Fear of current or ex partner: Not on file    Emotionally abused: Not on file    Physically abused: Not on file    Forced sexual activity: Not on file  Other Topics Concern   Not on file  Social History Narrative   Not on  file     Family History  Problem Relation Age of Onset   Depression Other        Parent   Arthritis Other        Parent, Grandparent   Hypertension Other        Grandparent   Hyperlipidemia Other        [place]   Miscarriages / Stillbirths Other        Grandparent   Stroke Other        [place]   Cancer Maternal Uncle        prostate   Hypertension Maternal Grandfather    Breast cancer Cousin    Breast cancer Cousin      Current Outpatient Medications on File Prior to Visit  Medication Sig Dispense Refill   BIOTIN PO Take by mouth.     calcium carbonate (CALCIUM 600) 1500 (600 Ca) MG TABS tablet Take 600 mg of elemental calcium by mouth 2 (two) times daily with a meal.     Cholecalciferol (VITAMIN D3) 3000 units TABS Take by mouth.     Choline Fenofibrate 135 MG capsule Take 135 mg by mouth daily.       DULoxetine (CYMBALTA) 60 MG capsule TAKE 1 CAPSULE BY MOUTH TWICE A DAY 180 capsule 0   Ferrous Sulfate (IRON) 325 (65 Fe) MG TABS Take by mouth.     furosemide (LASIX) 20 MG tablet Take 20 mg by mouth as needed.      levothyroxine (SYNTHROID, LEVOTHROID) 50 MCG tablet Take 50 mcg by mouth daily before breakfast.     Magnesium 500 MG TABS Take by mouth.     metFORMIN (GLUCOPHAGE-XR) 500 MG 24 hr tablet Take 1,000 mg by mouth 2 (two) times daily.   3   methylphenidate (RITALIN) 20 MG tablet Take 1 tablet (20 mg total) by mouth 2 (two) times daily. 180 tablet 0   Multiple Vitamin (MULTIVITAMIN) tablet Take 1 tablet by mouth daily.       nitroGLYCERIN (NITROSTAT) 0.4 MG SL tablet Place 1 tablet (0.4 mg total) under the tongue every 5 (five) minutes as needed for chest pain. 30 tablet 3   Omega-3 Fatty Acids (FISH OIL) 500 MG CAPS Take 1 capsule by mouth 2 (two) times daily.      omeprazole (PRILOSEC) 20 MG capsule TAKE 1 CAPSULE BY MOUTH TWICE A DAY WITH MEALS 180 capsule 0   ONE TOUCH ULTRA TEST test strip CHECK BLOOD SUGAR TWICE DAILY 100 each 3   ONETOUCH  DELICA LANCETS 65K MISC USE AS DIRECTED TWICE DAILY 200 each 3  RESTASIS MULTIDOSE 0.05 % ophthalmic emulsion INSTILL 1 DROP BY OPHTHALMIC ROUTE EVERY 12 HOURS INTO AFFECTED EYE(S)  6   rosuvastatin (CRESTOR) 20 MG tablet Take 20 mg by mouth daily.      tiZANidine (ZANAFLEX) 2 MG tablet TAKE 1 TABLET BY MOUTH TWICE A DAY AS NEEDED (Patient taking differently: Take 2 mg by mouth at bedtime. ) 180 tablet 0   valsartan-hydrochlorothiazide (DIOVAN-HCT) 320-25 MG tablet TAKE 1 TABLET BY MOUTH EVERY DAY 90 tablet 2   warfarin (COUMADIN) 5 MG tablet TAKE AS DIRECTED BY COUMADIN CLINIC. (Patient taking differently: 5mg  on Sunday& wed, 2.5mg  All other days) 90 tablet 1   No current facility-administered medications on file prior to visit.     Cardiovascular studies:  EKG 10/28/2018: Sinus rhythm 77 bpm. Right bundle branch block  Left anterior fascicular block. Consider RV hypertrophy. No change compared to previous EKG's.  EKG 10/17/2018: Sinus rhythm RBBB and LAFB  Lexiscan myoview stress test 01/30/2018: 1. The resting electrocardiogram demonstrated normal sinus rhythm, LAD, LAFB, RBBB and no resting arrhythmias.  The stress electrocardiogram was non-diagnostic due to pharmacologic stress. Stress symptoms included dyspnea. Resting BP 166/86 and peak BP 202/88 mm Hg.  2. Myocardial perfusion imaging is normal. Overall left ventricular systolic function was normal without regional wall motion abnormalities. The left ventricular ejection fraction was 56%.  This is a low risk study.  Echocardiogram 01/28/2018:  Left ventricle cavity is normal in size. Moderate concentric hypertrophy of the left ventricle. Normal global wall motion. Doppler evidence of grade I (impaired) diastolic dysfunction, normal LAP. Calculated EF 55%. Left atrial cavity is mildly dilated. Mild (Grade I) aortic regurgitation. Mild (Grade I) mitral regrgitation. Trace tricuspid regurgitation. Inadequate tricuspid  regurgitation jet to estimate pulmonary artery pressure. Normal right atrial pressure.  Recent labs: Results for DEVON, KINGDON (MRN [id number]) as of 10/29/2018 05:35  Ref. Range 10/17/2018 12:04 10/17/2018 13:43  COMPREHENSIVE METABOLIC PANEL Unknown Rpt (A)   Sodium Latest Ref Range: 135 - 145 mmol/L 140   Potassium Latest Ref Range: 3.5 - 5.1 mmol/L 4.2   Chloride Latest Ref Range: 98 - 111 mmol/L 109   CO2 Latest Ref Range: 22 - 32 mmol/L 23   Glucose Latest Ref Range: 70 - 99 mg/dL 98   BUN Latest Ref Range: 8 - 23 mg/dL 20   Creatinine Latest Ref Range: 0.44 - 1.00 mg/dL 0.80   Calcium Latest Ref Range: 8.9 - 10.3 mg/dL 9.0   Anion gap Latest Ref Range: 5 - 15  8   Alkaline Phosphatase Latest Ref Range: 38 - 126 U/L 37 (L)   Albumin Latest Ref Range: 3.5 - 5.0 g/dL 3.3 (L)   AST Latest Ref Range: 15 - 41 U/L 34   ALT Latest Ref Range: 0 - 44 U/L 24   Total Protein Latest Ref Range: 6.5 - 8.1 g/dL 6.1 (L)   Total Bilirubin Latest Ref Range: 0.3 - 1.2 mg/dL 0.5   GFR, Est Non African American Latest Ref Range: >60 mL/min >60   GFR, Est African American Latest Ref Range: >60 mL/min >60   Troponin I (High Sensitivity) Latest Ref Range: <18 ng/L 5 4   Review of Systems  Constitution: Negative for decreased appetite, malaise/fatigue, weight gain and weight loss.  HENT: Negative for congestion.   Eyes: Negative for visual disturbance.  Cardiovascular: Negative for chest pain, dyspnea on exertion, leg swelling, palpitations and syncope.  Respiratory: Negative for cough.   Endocrine: Negative for cold intolerance.  Hematologic/Lymphatic: Does not bruise/bleed easily.  Skin: Negative for itching and rash.  Musculoskeletal: Negative for myalgias.  Gastrointestinal: Negative for abdominal pain, nausea and vomiting.  Genitourinary: Negative for dysuria.  Neurological: Negative for dizziness and weakness.  Psychiatric/Behavioral: The patient is not nervous/anxious.   All other systems  reviewed and are negative.       Vitals:   12/25/18 1558  BP: (!) 145/100  Pulse: 89    Body mass index is 23.69 kg/m. Filed Weights   12/25/18 1558  Weight: 138 lb (62.6 kg)     Objective:   Physical Exam  Constitutional: She is oriented to person, place, and time. She appears well-developed and well-nourished. No distress.  HENT:  Head: Normocephalic and atraumatic.  Eyes: Pupils are equal, round, and reactive to light. Conjunctivae are normal.  Neck: No JVD present.  Cardiovascular: Normal rate, regular rhythm and intact distal pulses.  Pulmonary/Chest: Effort normal and breath sounds normal. She has no wheezes. She has no rales.  Abdominal: Soft. Bowel sounds are normal. There is no rebound.  Musculoskeletal:        General: No edema.  Lymphadenopathy:    She has no cervical adenopathy.  Neurological: She is alert and oriented to person, place, and time. No cranial nerve deficit.  Skin: Skin is warm and dry.  Psychiatric: She has a normal mood and affect.  Nursing note and vitals reviewed.         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 MI was ruled out in the ED. Although she has h.o DVT, suspicion for PE is low. She has had a low risk stress test within past one year. Recommend watchhul monitoring for now. Added amlodipine 5 mg daily. Encouraged her to use SL NTG when needed. If symptoms persist, may consider coronary angiography in future.  F/u in 8 weeks.   Nigel Mormon, MD [facility] Pager: [phone] Office: [phone] If no answer Cell [phone]

## 2018-12-29 ENCOUNTER — Emergency Department (HOSPITAL_COMMUNITY)
Admission: EM | Admit: 2018-12-29 | Discharge: 2018-12-29 | Disposition: A | Discharge status: Home / Self Care | Payer: Medicare Other | Attending: Emergency Medicine | Admitting: Emergency Medicine

## 2018-12-29 ENCOUNTER — Other Ambulatory Visit: Payer: Self-pay

## 2018-12-29 DIAGNOSIS — Z7984 Long term (current) use of oral hypoglycemic drugs: Secondary | ICD-10-CM | POA: Diagnosis not present

## 2018-12-29 DIAGNOSIS — E039 Hypothyroidism, unspecified: Secondary | ICD-10-CM | POA: Insufficient documentation

## 2018-12-29 DIAGNOSIS — Z79899 Other long term (current) drug therapy: Secondary | ICD-10-CM | POA: Diagnosis not present

## 2018-12-29 DIAGNOSIS — E119 Type 2 diabetes mellitus without complications: Secondary | ICD-10-CM | POA: Insufficient documentation

## 2018-12-29 DIAGNOSIS — I10 Essential (primary) hypertension: Secondary | ICD-10-CM | POA: Insufficient documentation

## 2018-12-29 DIAGNOSIS — M5412 Radiculopathy, cervical region: Secondary | ICD-10-CM | POA: Diagnosis not present

## 2018-12-29 DIAGNOSIS — Z7901 Long term (current) use of anticoagulants: Secondary | ICD-10-CM | POA: Diagnosis not present

## 2018-12-29 DIAGNOSIS — Z853 Personal history of malignant neoplasm of breast: Secondary | ICD-10-CM | POA: Insufficient documentation

## 2018-12-29 DIAGNOSIS — M542 Cervicalgia: Secondary | ICD-10-CM | POA: Diagnosis present

## 2018-12-29 MED ORDER — PREDNISONE 20 MG PO TABS: 20.0000 mg | ORAL_TABLET | Freq: Every day | ORAL | 0 refills | Status: DC

## 2018-12-29 NOTE — ED Triage Notes (Signed)
Pt here for evaluation of right sided neck and shoulder pain that began 1 month ago, worsened over the past 2 weeks. Denies any recent injuries.  HX of hypertension.

## 2018-12-29 NOTE — ED Provider Notes (Signed)
[facility] EMERGENCY DEPARTMENT Provider Note   CSN: [id number] Arrival date & time: 12/29/18  1629     History   Chief Complaint Chief Complaint  Patient presents with  . Neck Pain    HPI Andrea Simmons is a 78 y.o. female.     HPI -Patient presents with neck pain.  Has been going for last 3 weeks.  Sharp and in the right side of her neck and shoulder.  Goes down the arm.  Occasionally has some tingling.  Has been dealing with neck pain for the last 42 years since she had a cervical fracture.  No recent fall.  No numbness or weakness.  No recent chest pain.  Not worse with exertion.  Worse with palpations and certain movements.  States she is had some difficulty sleeping.  She is on anticoagulation.  Remote history of breast cancer 26 years ago. Past Medical History:  Diagnosis Date  . Anemia   . Anxiety   . Breast cancer ([place])   . Depression   . Diabetes mellitus type II   . Fibromyalgia   . GERD (gastroesophageal reflux disease)   . Hyperlipidemia   . Hypertension   . Malignant neoplasm of breast (female), unspecified site 1993   L breast s/p mastectomy and tamoxifen x 102yrs  . 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 Unspecified hypothyroidism     Patient Active Problem List   Diagnosis Date Noted  . Chest pain of uncertain etiology 59/56/3875  . Hx of pulmonary embolus 03/30/2017  . Physical exam 11/30/2016  . Hyperlipidemia 12/26/2013  . Encounter for therapeutic drug monitoring 04/16/2013  . 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 Long term (current) use of anticoagulants 06/30/2010  . Exertional dyspnea 11/09/2008  . Diabetes type 2, controlled ([place]) 08/27/2008  . ANEMIA-NOS 08/27/2008  . Seasonal and perennial allergic rhinitis 08/27/2008  . PULMONARY NODULE 08/27/2008  . Fibromyalgia 08/27/2008  . ADENOCARCINOMA, BREAST 03/20/2007  . Hypothyroidism  03/20/2007  . Essential hypertension 03/20/2007  . Recurrent pulmonary emboli ([place]) 03/20/2007  . GERD 03/20/2007  . ESOPHAGEAL STRICTURE 03/08/2007    Past Surgical History:  Procedure Laterality Date  . APPENDECTOMY    . BREAST BIOPSY Left 1993  . BREAST BIOPSY Right 09/25/2014   stero. Benign  . BREAST RECONSTRUCTION Left   . CATARACT EXTRACTION    . MASTECTOMY Left   . ROTATOR CUFF REPAIR    . SPINAL FUSION      x 2  . TOTAL ABDOMINAL HYSTERECTOMY W/ BILATERAL SALPINGOOPHORECTOMY    . TUBAL LIGATION       OB History   No obstetric history on file.      Home Medications    Prior to Admission medications   Medication Sig Start Date End Date Taking? Authorizing Provider  amLODipine (NORVASC) 5 MG tablet Take 1 tablet (5 mg total) by mouth daily. 12/25/18 03/25/19  [name], Reynold Bowen, MD  BIOTIN PO Take by mouth.    [provider]  calcium carbonate (CALCIUM 600) 1500 (600 Ca) MG TABS tablet Take 600 mg of elemental calcium by mouth 2 (two) times daily with a meal.    [provider]  Cholecalciferol (VITAMIN D3) 3000 units TABS Take by mouth.    [provider]  Choline Fenofibrate 135 MG capsule Take 135 mg by mouth daily.      [provider]  DULoxetine (CYMBALTA) 60 MG capsule TAKE 1 CAPSULE BY MOUTH TWICE A DAY 11/15/18   [name], Billey Co., MD  Ferrous Sulfate (IRON) 325 (65 Fe) MG TABS Take by mouth.    [provider]  furosemide (LASIX) 20 MG tablet Take 20 mg by mouth as needed.  05/09/18   [provider]  levothyroxine (SYNTHROID, LEVOTHROID) 50 MCG tablet Take 50 mcg by mouth daily before breakfast.    [provider]  Magnesium 500 MG TABS Take by mouth.    [provider]  metFORMIN (GLUCOPHAGE-XR) 500 MG 24 hr tablet Take 1,000 mg by mouth 2 (two) times daily.  03/23/16   [provider]  methylphenidate (RITALIN) 20 MG tablet Take 1 tablet (20 mg total) by mouth 2 (two) times  daily. 12/04/18   [name], Billey Co., MD  Multiple Vitamin (MULTIVITAMIN) tablet Take 1 tablet by mouth daily.      [provider]  nitroGLYCERIN (NITROSTAT) 0.4 MG SL tablet Place 1 tablet (0.4 mg total) under the tongue every 5 (five) minutes as needed for chest pain. 10/31/18 01/29/19  [name], Reynold Bowen, MD  Omega-3 Fatty Acids (FISH OIL) 500 MG CAPS Take 1 capsule by mouth 2 (two) times daily.     [provider]  omeprazole (PRILOSEC) 20 MG capsule TAKE 1 CAPSULE BY MOUTH TWICE A DAY WITH MEALS 12/19/18   Tanda Rockers, MD  ONE TOUCH ULTRA TEST test strip CHECK BLOOD SUGAR TWICE DAILY 07/11/18   [name], MD  [facility] DELICA LANCETS 94B MISC USE AS DIRECTED TWICE DAILY 10/10/17   Brunetta Jeans, PA-C  predniSONE (DELTASONE) 20 MG tablet Take 1 tablet (20 mg total) by mouth daily. 12/29/18   Davonna Belling, MD  RESTASIS MULTIDOSE 0.05 % ophthalmic emulsion INSTILL 1 DROP BY OPHTHALMIC ROUTE EVERY 12 HOURS INTO AFFECTED EYE(S) 06/21/16   [provider]  rosuvastatin (CRESTOR) 20 MG tablet Take 20 mg by mouth daily.     [provider]  tiZANidine (ZANAFLEX) 2 MG tablet TAKE 1 TABLET BY MOUTH TWICE A DAY AS NEEDED Patient taking differently: Take 2 mg by mouth at bedtime.  11/18/18   [name], MD  valsartan-hydrochlorothiazide (DIOVAN-HCT) 320-25 MG tablet TAKE 1 TABLET BY MOUTH EVERY DAY 03/21/18   [name], MD  warfarin (COUMADIN) 5 MG tablet TAKE AS DIRECTED BY COUMADIN CLINIC. Patient taking differently: 5mg  on Sunday& wed, 2.5mg  All other days 07/04/18   [name], MD    Family History Family History  Problem Relation Age of Onset  . Depression Other        Parent  . Arthritis Other        Parent, Grandparent  . Hypertension Other        Grandparent  . Hyperlipidemia Other        [place]  . Miscarriages / Stillbirths Other        Grandparent  . Stroke Other        [facility]  . Cancer Maternal Uncle         prostate  . Hypertension Maternal Grandfather   . Breast cancer Cousin   . Breast cancer Cousin     Social History Social History   Tobacco Use  . Smoking status: Never Smoker  . Smokeless tobacco: Never Used  Substance Use Topics  . Alcohol use: No    Alcohol/week: 0.0 standard drinks  . Drug use: No     Allergies  Anaprox [naproxen sodium], Lipitor [atorvastatin], Morphine, Meperidine, and Naproxen sodium   Review of Systems Review of Systems  Constitutional: Negative for appetite change.  Respiratory: Negative for shortness of breath.   Cardiovascular: Negative for chest pain.  Gastrointestinal: Negative for abdominal pain.  Genitourinary: Negative for flank pain.  Musculoskeletal: Positive for neck pain.  Skin: Negative for rash.  Neurological: Negative for weakness and numbness.  Psychiatric/Behavioral: Negative for confusion.     Physical Exam Updated Vital Signs BP (!) 144/73 (BP Location: Right Arm)   Pulse 84   Temp 98.4 F (36.9 C) (Oral)   Resp 12   Ht 5\' 4"  (1.626 m)   Wt 62.1 kg   SpO2 100%   BMI 23.52 kg/m   Physical Exam Vitals signs and nursing note reviewed.  Eyes:     Pupils: Pupils are equal, round, and reactive to light.  Neck:     Musculoskeletal: Neck supple.     Comments: Tenderness over trapezius muscle on the right paraspinal area.  No midline tenderness. Cardiovascular:     Rate and Rhythm: Regular rhythm.  Pulmonary:     Breath sounds: No wheezing, rhonchi or rales.  Abdominal:     Tenderness: There is no abdominal tenderness.  Musculoskeletal:        General: Tenderness present.     Comments: Tenderness over right paraspinal area.  Skin:    Findings: No rash.  Neurological:     Mental Status: She is alert and oriented to person, place, and time.     Comments: Neurovascular intact on right upper extremity.  Psychiatric:        Mood and Affect: Mood normal.      ED Treatments / Results  Labs (all labs ordered  are listed, but only abnormal results are displayed) Labs Reviewed - No data to display  EKG None  Radiology No results found.  Procedures Procedures (including critical care time)  Medications Ordered in ED Medications - No data to display   Initial Impression / Assessment and Plan / ED Course  I have reviewed the triage vital signs and the nursing notes.  Pertinent labs & imaging results that were available during my care of the patient were reviewed by me and considered in my medical decision making (see chart for details).        Patient with neck pain.  I think most likely musculoskeletal versus cervical radiculopathy.  Has a few red flags and that she is on Coumadin and had a remote history of breast cancer.  However no recent trauma.  Do not think that we need imaging at this time.  Will give a short course of steroids.  Already on muscle relaxer.  Follow-up with PCP as needed  Final Clinical Impressions(s) / ED Diagnoses   Final diagnoses:  Cervical radiculopathy    ED Discharge Orders         Ordered    predniSONE (DELTASONE) 20 MG tablet  Daily     12/29/18 1840           Davonna Belling, MD 12/29/18 1851

## 2018-12-29 NOTE — ED Notes (Signed)
Pt verbalized understanding of discharge paperwork, prescriptions and follow-up care 

## 2018-12-29 NOTE — Discharge Instructions (Addendum)
The steroid should hopefully help with the pain.  Follow-up with your doctor.

## 2019-01-01 ENCOUNTER — Other Ambulatory Visit: Payer: Self-pay

## 2019-01-01 ENCOUNTER — Ambulatory Visit (INDEPENDENT_AMBULATORY_CARE_PROVIDER_SITE_OTHER): Payer: Medicare Other | Admitting: General Practice

## 2019-01-01 DIAGNOSIS — Z7901 Long term (current) use of anticoagulants: Secondary | ICD-10-CM

## 2019-01-01 DIAGNOSIS — I2699 Other pulmonary embolism without acute cor pulmonale: Secondary | ICD-10-CM

## 2019-01-01 LAB — POCT INR: INR: 5.1 — AB (ref 2.0–3.0)

## 2019-01-01 NOTE — Progress Notes (Signed)
I have reviewed the results and agree with this plan   

## 2019-01-01 NOTE — Patient Instructions (Signed)
Pre visit review using our clinic review tool, if applicable. No additional management support is needed unless otherwise documented below in the visit note.  Hold dosage today, tomorrow and Friday.  On Saturday start taking 1/2 tablet daily except 1 tablet on Sundays.  Re-check in 2 weeks.

## 2019-01-03 ENCOUNTER — Other Ambulatory Visit: Payer: Self-pay | Admitting: Psychiatry

## 2019-01-09 ENCOUNTER — Telehealth: Payer: Self-pay | Admitting: Family Medicine

## 2019-01-09 NOTE — Telephone Encounter (Signed)
Please advise 

## 2019-01-09 NOTE — Telephone Encounter (Signed)
Called and advised pt.

## 2019-01-09 NOTE — Telephone Encounter (Signed)
The only lab done was Vit D so we will be doing the usual blood work.  Fasting would be the most accurate but if it's too late in the morning, she can certainly eat if needed

## 2019-01-09 NOTE — Telephone Encounter (Signed)
The patient has an appointment tomorrow for her physical and was wanting to know if she needs to come fasting since she just had labs 4 weeks ago.  Please advise

## 2019-01-10 ENCOUNTER — Other Ambulatory Visit: Payer: Self-pay

## 2019-01-10 ENCOUNTER — Encounter: Payer: Self-pay | Admitting: Family Medicine

## 2019-01-10 ENCOUNTER — Encounter: Payer: Self-pay | Admitting: General Practice

## 2019-01-10 ENCOUNTER — Ambulatory Visit (INDEPENDENT_AMBULATORY_CARE_PROVIDER_SITE_OTHER): Payer: Medicare Other | Admitting: Family Medicine

## 2019-01-10 VITALS — BP 122/82 | HR 74 | Temp 97.9°F | Resp 16 | Ht 64.0 in | Wt 138.0 lb

## 2019-01-10 DIAGNOSIS — Z23 Encounter for immunization: Secondary | ICD-10-CM | POA: Diagnosis not present

## 2019-01-10 DIAGNOSIS — G8929 Other chronic pain: Secondary | ICD-10-CM

## 2019-01-10 DIAGNOSIS — E119 Type 2 diabetes mellitus without complications: Secondary | ICD-10-CM | POA: Diagnosis not present

## 2019-01-10 DIAGNOSIS — Z0000 Encounter for general adult medical examination without abnormal findings: Secondary | ICD-10-CM

## 2019-01-10 DIAGNOSIS — I10 Essential (primary) hypertension: Secondary | ICD-10-CM

## 2019-01-10 DIAGNOSIS — C50912 Malignant neoplasm of unspecified site of left female breast: Secondary | ICD-10-CM

## 2019-01-10 DIAGNOSIS — E039 Hypothyroidism, unspecified: Secondary | ICD-10-CM

## 2019-01-10 DIAGNOSIS — Z0001 Encounter for general adult medical examination with abnormal findings: Secondary | ICD-10-CM | POA: Diagnosis not present

## 2019-01-10 DIAGNOSIS — M25511 Pain in right shoulder: Secondary | ICD-10-CM

## 2019-01-10 DIAGNOSIS — E785 Hyperlipidemia, unspecified: Secondary | ICD-10-CM | POA: Diagnosis not present

## 2019-01-10 DIAGNOSIS — I2699 Other pulmonary embolism without acute cor pulmonale: Secondary | ICD-10-CM

## 2019-01-10 LAB — CBC WITH DIFFERENTIAL/PLATELET
Basophils Absolute: 0 10*3/uL (ref 0.0–0.1)
Basophils Relative: 0.4 % (ref 0.0–3.0)
Eosinophils Absolute: 0 10*3/uL (ref 0.0–0.7)
Eosinophils Relative: 0.9 % (ref 0.0–5.0)
HCT: 36.3 % (ref 36.0–46.0)
Hemoglobin: 12 g/dL (ref 12.0–15.0)
Lymphocytes Relative: 30.1 % (ref 12.0–46.0)
Lymphs Abs: 1.6 10*3/uL (ref 0.7–4.0)
MCHC: 33.1 g/dL (ref 30.0–36.0)
MCV: 91.8 fl (ref 78.0–100.0)
Monocytes Absolute: 0.4 10*3/uL (ref 0.1–1.0)
Monocytes Relative: 7.2 % (ref 3.0–12.0)
Neutro Abs: 3.3 10*3/uL (ref 1.4–7.7)
Neutrophils Relative %: 61.4 % (ref 43.0–77.0)
Platelets: 247 10*3/uL (ref 150.0–400.0)
RBC: 3.96 Mil/uL (ref 3.87–5.11)
RDW: 13.5 % (ref 11.5–15.5)
WBC: 5.4 10*3/uL (ref 4.0–10.5)

## 2019-01-10 LAB — HEPATIC FUNCTION PANEL
ALT: 25 U/L (ref 0–35)
AST: 40 U/L — ABNORMAL HIGH (ref 0–37)
Albumin: 4.3 g/dL (ref 3.5–5.2)
Alkaline Phosphatase: 41 U/L (ref 39–117)
Bilirubin, Direct: 0.1 mg/dL (ref 0.0–0.3)
Total Bilirubin: 0.5 mg/dL (ref 0.2–1.2)
Total Protein: 6.5 g/dL (ref 6.0–8.3)

## 2019-01-10 LAB — LIPID PANEL
Cholesterol: 133 mg/dL (ref 0–200)
HDL: 38.9 mg/dL — ABNORMAL LOW (ref >39.00)
LDL Cholesterol: 71 mg/dL (ref 0–99)
NonHDL: 94.43
Total CHOL/HDL Ratio: 3
Triglycerides: 119 mg/dL (ref 0.0–149.0)
VLDL: 23.8 mg/dL (ref 0.0–40.0)

## 2019-01-10 LAB — BASIC METABOLIC PANEL
BUN: 19 mg/dL (ref 6–23)
CO2: 26 mEq/L (ref 19–32)
Calcium: 9.4 mg/dL (ref 8.4–10.5)
Chloride: 106 mEq/L (ref 96–112)
Creatinine, Ser: 0.81 mg/dL (ref 0.40–1.20)
GFR: 68.61 mL/min (ref >60.00)
Glucose, Bld: 101 mg/dL — ABNORMAL HIGH (ref 70–99)
Potassium: 4.1 mEq/L (ref 3.5–5.1)
Sodium: 140 mEq/L (ref 135–145)

## 2019-01-10 LAB — MICROALBUMIN / CREATININE URINE RATIO
Creatinine,U: 127.3 mg/dL
Microalb Creat Ratio: 2.2 mg/g (ref 0.0–30.0)
Microalb, Ur: 2.8 mg/dL — ABNORMAL HIGH (ref 0.0–1.9)

## 2019-01-10 LAB — TSH: TSH: 0.99 u[IU]/mL (ref 0.35–4.50)

## 2019-01-10 NOTE — Assessment & Plan Note (Signed)
Chronic problem.  Following w/ Dr Chalmers Cater.  A1C UTD.  UTD on foot exam, eye exam.  Was on ARB until recently for renal protection so will get microalbumin.

## 2019-01-10 NOTE — Assessment & Plan Note (Signed)
Chronic problem.  Tolerating statin w/o difficulty.  Check labs.  Adjust meds prn  

## 2019-01-10 NOTE — Assessment & Plan Note (Signed)
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  UTD on mammogram, foot exam, eye exam, immunizations.  No longer doing colon cancer screening.  Flu given today.  Check labs.  Anticipatory guidance provided.

## 2019-01-10 NOTE — Assessment & Plan Note (Signed)
Chronic problem.  Well controlled.  Currently asymptomatic.  Check labs.  No anticipated med changes.  Will follow. 

## 2019-01-10 NOTE — Assessment & Plan Note (Signed)
Following w/ Dr Irene Limbo

## 2019-01-10 NOTE — Assessment & Plan Note (Signed)
Chronic problem.  Currently asymptomatic.  Check labs.  Adjust meds prn  

## 2019-01-10 NOTE — Progress Notes (Signed)
Subjective:    Patient ID: Andrea Simmons, female    DOB: 1940-09-06, 78 y.o.   MRN: [id number]  HPI Here today for CPE and MWV.  Risk Factors: DM- chronic problem, following w/ Dr Chalmers Cater.  UTD on foot exam, eye exam.  Was on ARB until recently for renal protection.  On Metformin XR 1000mg  BID.  Denies symptomatic lows.  No numbness/tingling of hands/feet. HTN- chronic problem, following w/ cardiology.  Well controlled today on Amlodipine 5 mg daily, Lasix 20mg  daily.  Denies CP, SOB, HAs, visual changes, edema. Hyperlipidemia- chronic problem, on Crestor 20mg  w/o difficulty.  Denies abd pain, N/V Hypothyroid- chronic problem, on Levothyroxine 12mcg.  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 Breast cancer- following w/ Dr Irene Limbo Physical Activity: no regular exercise Fall Risk: low Depression: currently medicated, following w/ psych Hearing: normal to conversational tones, decreased to whispered voice ADL's: independent Cognitive: normal linear thought process, memory and attention intact Home Safety: safe at home, lives w/ husband Height, Weight, BMI, Visual Acuity: see vitals, vision corrected to 20/20 w/ glasses Counseling: UTD on mammogram, foot exam, eye exam, immunizations (will get flu today) Labs Ordered: See A&P Care Plan: See A&P   Patient Care Team    Relationship Specialty Notifications Start End  [name], MD PCP - General Family Medicine  04/20/16   Irene Shipper, MD Consulting Physician Gastroenterology  11/14/10   Deneise Lever, MD Consulting Physician Pulmonary Disease  11/14/10   Jacelyn Pi, MD Consulting Physician Endocrinology  11/14/10   Valinda Party, MD Consulting Physician Rheumatology  04/20/16   Purnell Shoemaker., MD Attending Physician Psychiatry  04/20/16   Renie Ora, MD Referring Physician Anesthesiology  04/20/16   Brunetta Genera, MD Consulting Physician Hematology  11/30/16        Review of Systems Patient reports no vision/ hearing changes, adenopathy,fever, weight change,  persistant/recurrent hoarseness , swallowing issues, chest pain, palpitations, edema, persistant/recurrent cough, hemoptysis, dyspnea (rest/exertional/paroxysmal nocturnal), gastrointestinal bleeding (melena, rectal bleeding), abdominal pain, significant heartburn, bowel changes, GU symptoms (dysuria, hematuria, incontinence), Gyn symptoms (abnormal  bleeding, pain),  syncope, focal weakness, memory loss, numbness & tingling, skin/hair/nail changes, abnormal bruising or bleeding, anxiety, or depression.   + R shoulder/neck- pt reports episodic pain.  When episodes occur 'oh it hurts so bad'.  Has ended up in ER.  Much improved w/ Prednisone.  Has had injxns in the past.    Objective:   Physical Exam General Appearance:    Alert, cooperative, no distress, appears stated age  Head:    Normocephalic, without obvious abnormality, atraumatic  Eyes:    PERRL, conjunctiva/corneas clear, EOM's intact, fundi    benign, both eyes  Ears:    Normal TM's and external ear canals, both ears  Nose:   Deferred due to COVID  Throat:   Neck:   Supple, symmetrical, trachea midline, no adenopathy;    Thyroid: no enlargement/tenderness/nodules  Back:     Symmetric, no curvature, ROM normal, no CVA tenderness  Lungs:     Clear to auscultation bilaterally, respirations unlabored  Chest Wall:    No tenderness or deformity   Heart:    Regular rate and rhythm, S1 and S2 normal, no murmur, rub   or gallop  Breast Exam:    Deferred to mammo  Abdomen:     Soft, non-tender, bowel sounds active all four quadrants,    no masses, no organomegaly  Genitalia:  Deferred  Rectal:    Extremities:   Extremities normal, atraumatic, no cyanosis or edema  Pulses:   2+ and symmetric all extremities  Skin:   Skin color, texture, turgor normal, no rashes or lesions  Lymph nodes:   Cervical, supraclavicular, and axillary nodes normal   Neurologic:   CNII-XII intact, normal strength, sensation and reflexes    throughout          Assessment & Plan:

## 2019-01-10 NOTE — Patient Instructions (Addendum)
Follow up in 6 months to recheck BP and cholesterol We'll notify you of your lab results and make any changes if needed We'll call you with your Orthopedic appt for the shoulder Keep up the good work!  You look great! Call with any questions or concerns Stay Safe!!!   Preventive Care 76 Years and Older, Female Preventive care refers to lifestyle choices and visits with your health care provider that can promote health and wellness. This includes:  A yearly physical exam. This is also called an annual well check.  Regular dental and eye exams.  Immunizations.  Screening for certain conditions.  Healthy lifestyle choices, such as diet and exercise. What can I expect for my preventive care visit? Physical exam Your health care provider will check:  Height and weight. These may be used to calculate body mass index (BMI), which is a measurement that tells if you are at a healthy weight.  Heart rate and blood pressure.  Your skin for abnormal spots. Counseling Your health care provider may ask you questions about:  Alcohol, tobacco, and drug use.  Emotional well-being.  Home and relationship well-being.  Sexual activity.  Eating habits.  History of falls.  Memory and ability to understand (cognition).  Work and work Statistician.  Pregnancy and menstrual history. What immunizations do I need?  Influenza (flu) vaccine  This is recommended every year. Tetanus, diphtheria, and pertussis (Tdap) vaccine  You may need a Td booster every 10 years. Varicella (chickenpox) vaccine  You may need this vaccine if you have not already been vaccinated. Zoster (shingles) vaccine  You may need this after age 51. Pneumococcal conjugate (PCV13) vaccine  One dose is recommended after age 73. Pneumococcal polysaccharide (PPSV23) vaccine  One dose is recommended after age 31. Measles, mumps, and rubella (MMR) vaccine  You may need at least one dose of MMR if you were born in  1957 or later. You may also need a second dose. Meningococcal conjugate (MenACWY) vaccine  You may need this if you have certain conditions. Hepatitis A vaccine  You may need this if you have certain conditions or if you travel or work in places where you may be exposed to hepatitis A. Hepatitis B vaccine  You may need this if you have certain conditions or if you travel or work in places where you may be exposed to hepatitis B. Haemophilus influenzae type b (Hib) vaccine  You may need this if you have certain conditions. You may receive vaccines as individual doses or as more than one vaccine together in one shot (combination vaccines). Talk with your health care provider about the risks and benefits of combination vaccines. What tests do I need? Blood tests  Lipid and cholesterol levels. These may be checked every 5 years, or more frequently depending on your overall health.  Hepatitis C test.  Hepatitis B test. Screening  Lung cancer screening. You may have this screening every year starting at age 25 if you have a 30-pack-year history of smoking and currently smoke or have quit within the past 15 years.  Colorectal cancer screening. All adults should have this screening starting at age 58 and continuing until age 3. Your health care provider may recommend screening at age 29 if you are at increased risk. You will have tests every 1-10 years, depending on your results and the type of screening test.  Diabetes screening. This is done by checking your blood sugar (glucose) after you have not eaten for a while (fasting).  You may have this done every 1-3 years.  Mammogram. This may be done every 1-2 years. Talk with your health care provider about how often you should have regular mammograms.  BRCA-related cancer screening. This may be done if you have a family history of breast, ovarian, tubal, or peritoneal cancers. Other tests  Sexually transmitted disease (STD) testing.  Bone  density scan. This is done to screen for osteoporosis. You may have this done starting at age 81. Follow these instructions at home: Eating and drinking  Eat a diet that includes fresh fruits and vegetables, whole grains, lean protein, and low-fat dairy products. Limit your intake of foods with high amounts of sugar, saturated fats, and salt.  Take vitamin and mineral supplements as recommended by your health care provider.  Do not drink alcohol if your health care provider tells you not to drink.  If you drink alcohol: ? Limit how much you have to 0-1 drink a day. ? Be aware of how much alcohol is in your drink. In the [place] one drink equals one 12 oz bottle of beer (355 mL), one 5 oz glass of wine (148 mL), or one 1 oz glass of hard liquor (44 mL). Lifestyle  Take daily care of your teeth and gums.  Stay active. Exercise for at least 30 minutes on 5 or more days each week.  Do not use any products that contain nicotine or tobacco, such as cigarettes, e-cigarettes, and chewing tobacco. If you need help quitting, ask your health care provider.  If you are sexually active, practice safe sex. Use a condom or other form of protection in order to prevent STIs (sexually transmitted infections).  Talk with your health care provider about taking a low-dose aspirin or statin. What's next?  Go to your health care provider once a year for a well check visit.  Ask your health care provider how often you should have your eyes and teeth checked.  Stay up to date on all vaccines. This information is not intended to replace advice given to you by your health care provider. Make sure you discuss any questions you have with your health care provider. Document Released: 04/02/2015 Document Revised: 02/28/2018 Document Reviewed: 02/28/2018 Elsevier Patient Education  2020 [employer].

## 2019-01-10 NOTE — Assessment & Plan Note (Signed)
On lifelong anticoagulation.  Currently asymptomatic.  Following w/ coumadin clinic.

## 2019-01-13 ENCOUNTER — Encounter: Payer: Self-pay | Admitting: General Practice

## 2019-01-15 ENCOUNTER — Ambulatory Visit (INDEPENDENT_AMBULATORY_CARE_PROVIDER_SITE_OTHER): Payer: Medicare Other | Admitting: General Practice

## 2019-01-15 ENCOUNTER — Other Ambulatory Visit: Payer: Self-pay

## 2019-01-15 DIAGNOSIS — I2699 Other pulmonary embolism without acute cor pulmonale: Secondary | ICD-10-CM | POA: Diagnosis not present

## 2019-01-15 DIAGNOSIS — Z7901 Long term (current) use of anticoagulants: Secondary | ICD-10-CM | POA: Diagnosis not present

## 2019-01-15 LAB — POCT INR: INR: 1.5 — AB (ref 2.0–3.0)

## 2019-01-15 NOTE — Patient Instructions (Signed)
Pre visit review using our clinic review tool, if applicable. No additional management support is needed unless otherwise documented below in the visit note.  Take 1 tablet today and Thursday.  On Friday start taking 1/2 tablet daily except 1 tablet on Sunday and Thursdays.  Re-check in 2 weeks.

## 2019-01-15 NOTE — Progress Notes (Signed)
I have reviewed the results and agree with this plan   

## 2019-01-27 ENCOUNTER — Ambulatory Visit (INDEPENDENT_AMBULATORY_CARE_PROVIDER_SITE_OTHER): Payer: Medicare Other | Admitting: Orthopedic Surgery

## 2019-01-27 ENCOUNTER — Ambulatory Visit: Payer: Self-pay

## 2019-01-27 ENCOUNTER — Ambulatory Visit (INDEPENDENT_AMBULATORY_CARE_PROVIDER_SITE_OTHER): Payer: Medicare Other | Admitting: General Practice

## 2019-01-27 ENCOUNTER — Other Ambulatory Visit: Payer: Self-pay

## 2019-01-27 DIAGNOSIS — Z7901 Long term (current) use of anticoagulants: Secondary | ICD-10-CM

## 2019-01-27 DIAGNOSIS — M542 Cervicalgia: Secondary | ICD-10-CM | POA: Diagnosis not present

## 2019-01-27 DIAGNOSIS — M5416 Radiculopathy, lumbar region: Secondary | ICD-10-CM | POA: Diagnosis not present

## 2019-01-27 DIAGNOSIS — I2699 Other pulmonary embolism without acute cor pulmonale: Secondary | ICD-10-CM

## 2019-01-27 DIAGNOSIS — M79601 Pain in right arm: Secondary | ICD-10-CM | POA: Diagnosis not present

## 2019-01-27 LAB — POCT INR: INR: 3 (ref 2.0–3.0)

## 2019-01-27 MED ORDER — PREDNISONE 5 MG (21) PO TBPK: ORAL_TABLET | ORAL | 0 refills | Status: DC

## 2019-01-27 NOTE — Patient Instructions (Signed)
Pre visit review using our clinic review tool, if applicable. No additional management support is needed unless otherwise documented below in the visit note.  Skip dosage today and the then continue to take 1/2 tablet daily except 1 tablet on Sunday and Thursdays.  Re-check in 4 weeks.

## 2019-01-29 ENCOUNTER — Ambulatory Visit: Payer: Medicare Other

## 2019-01-29 ENCOUNTER — Encounter: Payer: Self-pay | Admitting: Orthopedic Surgery
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
## 2019-01-30 ENCOUNTER — Telehealth: Payer: Self-pay

## 2019-01-30 NOTE — Telephone Encounter (Signed)
Noted  

## 2019-01-30 NOTE — Telephone Encounter (Signed)
Patient LM on triage phone stating she had her MRI scheduled for 02/25/19 at 1220pm

## 2019-02-20 ENCOUNTER — Ambulatory Visit: Payer: Medicare Other | Admitting: Cardiology

## 2019-02-24 ENCOUNTER — Encounter: Payer: Self-pay | Admitting: Cardiology

## 2019-02-24 ENCOUNTER — Ambulatory Visit (INDEPENDENT_AMBULATORY_CARE_PROVIDER_SITE_OTHER): Payer: Medicare Other | Admitting: Cardiology

## 2019-02-24 ENCOUNTER — Other Ambulatory Visit: Payer: Self-pay

## 2019-02-24 ENCOUNTER — Ambulatory Visit: Payer: Medicare Other

## 2019-02-24 VITALS — BP 175/105 | HR 77 | Temp 97.6°F | Ht 64.0 in | Wt 135.0 lb

## 2019-02-24 DIAGNOSIS — I10 Essential (primary) hypertension: Secondary | ICD-10-CM | POA: Diagnosis not present

## 2019-02-24 MED ORDER — LOSARTAN POTASSIUM 50 MG PO TABS: 50.0000 mg | ORAL_TABLET | Freq: Every day | ORAL | 3 refills | Status: DC

## 2019-02-24 NOTE — Progress Notes (Signed)
Follow up visit  Subjective:   Andrea Simmons, female    DOB: 10/04/1940, 78 y.o.   MRN: [id number]   Chief Complaint  Patient presents with  . Chest Pain  . 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  Patient has not had any recurrence of chest pain since last visit. Blood pressure is uncontrolled.   Past Medical History:  Diagnosis Date  . Anemia   . Anxiety   . Breast cancer ([place])   . Depression   . Diabetes mellitus type II   . Fibromyalgia   . GERD (gastroesophageal reflux disease)   . Hyperlipidemia   . Hypertension   . Malignant neoplasm of breast (female), unspecified site 1993   L breast s/p mastectomy and tamoxifen x 42yrs  . 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 Unspecified hypothyroidism      Past Surgical History:  Procedure Laterality Date  . APPENDECTOMY    . BREAST BIOPSY Left 1993  . BREAST BIOPSY Right 09/25/2014   stero. Benign  . BREAST RECONSTRUCTION Left   . CATARACT EXTRACTION    . MASTECTOMY Left   . ROTATOR CUFF REPAIR    . SPINAL FUSION      x 2  . TOTAL ABDOMINAL HYSTERECTOMY W/ BILATERAL SALPINGOOPHORECTOMY    . TUBAL LIGATION      = Social History   Socioeconomic History  . Marital status: Married    Spouse name: Not on file  . Number of children: 1  . Years of education: Not on file  . Highest education level: Not on file  Occupational History  . Occupation: Armed forces technical officer: RETIRED  . Occupation: Emergency planning/management officer  . Occupation: school bus driver  Social Needs  . Financial resource strain: Not on file  . Food insecurity    Worry: Not on file    Inability: Not on file  . Transportation needs    Medical: Not on file    Non-medical: Not on file  Tobacco Use  . Smoking status: Never Smoker   . Smokeless tobacco: Never Used  Substance and Sexual Activity  . Alcohol use: No    Alcohol/week: 0.0 standard drinks  . Drug use: No  . Sexual activity: Not on file  Lifestyle  . Physical activity    Days per week: Not on file    Minutes per session: Not on file  . Stress: Not on file  Relationships  . Social Herbalist on phone: Not on file    Gets together: Not on file    Attends religious service: Not on file    Active member of club or organization: Not on file    Attends meetings of clubs or organizations: Not on file    Relationship status: Not on file  . Intimate partner violence    Fear of current or ex partner: Not on file    Emotionally abused: Not on file    Physically abused: Not on file    Forced sexual activity: Not on file  Other Topics Concern  . Not on file  Social History Narrative  . Not on file     Family History  Problem Relation Age of Onset  . Depression Other        Parent  .  Arthritis Other        Parent, Grandparent  . Hypertension Other        Grandparent  . Hyperlipidemia Other        [place]  . Miscarriages / Stillbirths Other        Grandparent  . Stroke Other        [facility]  . Cancer Maternal Uncle        prostate  . Hypertension Maternal Grandfather   . Breast cancer Cousin   . Breast cancer Cousin      Current Outpatient Medications on File Prior to Visit  Medication Sig Dispense Refill  . amLODipine (NORVASC) 5 MG tablet Take 1 tablet (5 mg total) by mouth daily. 30 tablet 3  . BIOTIN PO Take by mouth.    . calcium carbonate (CALCIUM 600) 1500 (600 Ca) MG TABS tablet Take 600 mg of elemental calcium by mouth 2 (two) times daily with a meal.    . Cholecalciferol (VITAMIN D3) 3000 units TABS Take by mouth.    . Choline Fenofibrate 135 MG capsule Take 135 mg by mouth daily.      . DULoxetine (CYMBALTA) 60 MG capsule TAKE 1 CAPSULE BY MOUTH TWICE A DAY 180 capsule 0  . Ferrous Sulfate (IRON) 325 (65 Fe) MG TABS Take by  mouth.    . furosemide (LASIX) 20 MG tablet Take 20 mg by mouth as needed.     Marland Kitchen levothyroxine (SYNTHROID, LEVOTHROID) 50 MCG tablet Take 50 mcg by mouth daily before breakfast.    . Magnesium 500 MG TABS Take by mouth.    . metFORMIN (GLUCOPHAGE-XR) 500 MG 24 hr tablet Take 1,000 mg by mouth 2 (two) times daily.   3  . methylphenidate (RITALIN) 20 MG tablet Take 1 tablet (20 mg total) by mouth 2 (two) times daily. 180 tablet 0  . Multiple Vitamin (MULTIVITAMIN) tablet Take 1 tablet by mouth daily.      . Omega-3 Fatty Acids (FISH OIL) 500 MG CAPS Take 1 capsule by mouth 2 (two) times daily.     Marland Kitchen omeprazole (PRILOSEC) 20 MG capsule TAKE 1 CAPSULE BY MOUTH TWICE A DAY WITH MEALS 180 capsule 0  . ONE TOUCH ULTRA TEST test strip CHECK BLOOD SUGAR TWICE DAILY 100 each 3  . ONETOUCH DELICA LANCETS 39Q MISC USE AS DIRECTED TWICE DAILY 200 each 3  . RESTASIS MULTIDOSE 0.05 % ophthalmic emulsion INSTILL 1 DROP BY OPHTHALMIC ROUTE EVERY 12 HOURS INTO AFFECTED EYE(S)  6  . rosuvastatin (CRESTOR) 20 MG tablet Take 20 mg by mouth daily.     Marland Kitchen tiZANidine (ZANAFLEX) 2 MG tablet TAKE 1 TABLET BY MOUTH TWICE A DAY AS NEEDED (Patient taking differently: Take 2 mg by mouth at bedtime. ) 180 tablet 0  . warfarin (COUMADIN) 5 MG tablet TAKE AS DIRECTED BY COUMADIN CLINIC. (Patient taking differently: 5mg  on Sunday& wed, 2.5mg  All other days) 90 tablet 1  . nitroGLYCERIN (NITROSTAT) 0.4 MG SL tablet Place 1 tablet (0.4 mg total) under the tongue every 5 (five) minutes as needed for chest pain. 30 tablet 3   No current facility-administered medications on file prior to visit.     Cardiovascular studies:  EKG 10/28/2018: Sinus rhythm 77 bpm. Right bundle branch block  Left anterior fascicular block. Consider RV hypertrophy. No change compared to previous EKG's.  EKG 10/17/2018: Sinus rhythm RBBB and LAFB  Lexiscan myoview stress test 01/30/2018: 1. The resting electrocardiogram demonstrated normal sinus  rhythm, LAD, LAFB,  RBBB and no resting arrhythmias.  The stress electrocardiogram was non-diagnostic due to pharmacologic stress. Stress symptoms included dyspnea. Resting BP 166/86 and peak BP 202/88 mm Hg.  2. Myocardial perfusion imaging is normal. Overall left ventricular systolic function was normal without regional wall motion abnormalities. The left ventricular ejection fraction was 56%.  This is a low risk study.  Echocardiogram 01/28/2018:  Left ventricle cavity is normal in size. Moderate concentric hypertrophy of the left ventricle. Normal global wall motion. Doppler evidence of grade I (impaired) diastolic dysfunction, normal LAP. Calculated EF 55%. Left atrial cavity is mildly dilated. Mild (Grade I) aortic regurgitation. Mild (Grade I) mitral regrgitation. Trace tricuspid regurgitation. Inadequate tricuspid regurgitation jet to estimate pulmonary artery pressure. Normal right atrial pressure.  Recent labs: Results for TALIANA, MERSEREAU (MRN [id number]) as of 10/29/2018 05:35  Ref. Range 10/17/2018 12:04 10/17/2018 13:43  COMPREHENSIVE METABOLIC PANEL Unknown Rpt (A)   Sodium Latest Ref Range: 135 - 145 mmol/L 140   Potassium Latest Ref Range: 3.5 - 5.1 mmol/L 4.2   Chloride Latest Ref Range: 98 - 111 mmol/L 109   CO2 Latest Ref Range: 22 - 32 mmol/L 23   Glucose Latest Ref Range: 70 - 99 mg/dL 98   BUN Latest Ref Range: 8 - 23 mg/dL 20   Creatinine Latest Ref Range: 0.44 - 1.00 mg/dL 0.80   Calcium Latest Ref Range: 8.9 - 10.3 mg/dL 9.0   Anion gap Latest Ref Range: 5 - 15  8   Alkaline Phosphatase Latest Ref Range: 38 - 126 U/L 37 (L)   Albumin Latest Ref Range: 3.5 - 5.0 g/dL 3.3 (L)   AST Latest Ref Range: 15 - 41 U/L 34   ALT Latest Ref Range: 0 - 44 U/L 24   Total Protein Latest Ref Range: 6.5 - 8.1 g/dL 6.1 (L)   Total Bilirubin Latest Ref Range: 0.3 - 1.2 mg/dL 0.5   GFR, Est Non African American Latest Ref Range: >60 mL/min >60   GFR, Est African American Latest Ref  Range: >60 mL/min >60   Troponin I (High Sensitivity) Latest Ref Range: <18 ng/L 5 4   Review of Systems  Constitution: Negative for decreased appetite, malaise/fatigue, weight gain and weight loss.  HENT: Negative for congestion.   Eyes: Negative for visual disturbance.  Cardiovascular: Negative for chest pain, dyspnea on exertion, leg swelling, palpitations and syncope.  Respiratory: Negative for cough.   Endocrine: Negative for cold intolerance.  Hematologic/Lymphatic: Does not bruise/bleed easily.  Skin: Negative for itching and rash.  Musculoskeletal: Negative for myalgias.  Gastrointestinal: Negative for abdominal pain, nausea and vomiting.  Genitourinary: Negative for dysuria.  Neurological: Negative for dizziness and weakness.  Psychiatric/Behavioral: The patient is not nervous/anxious.   All other systems reviewed and are negative.       Vitals:   02/24/19 1117 02/24/19 1142  BP: (!) 184/96 (!) 175/105  Pulse: 79 77  Temp: 97.6 F (36.4 C)   SpO2: 100%     Body mass index is 23.17 kg/m. Filed Weights   02/24/19 1117  Weight: 135 lb (61.2 kg)     Objective:   Physical Exam  Constitutional: She is oriented to person, place, and time. She appears well-developed and well-nourished. No distress.  HENT:  Head: Normocephalic and atraumatic.  Eyes: Pupils are equal, round, and reactive to light. Conjunctivae are normal.  Neck: No JVD present.  Cardiovascular: Normal rate, regular rhythm and intact distal pulses.  Pulmonary/Chest: Effort normal and breath sounds  normal. She has no wheezes. She has no rales.  Abdominal: Soft. Bowel sounds are normal. There is no rebound.  Musculoskeletal:        General: No edema.  Lymphadenopathy:    She has no cervical adenopathy.  Neurological: She is alert and oriented to person, place, and time. No cranial nerve deficit.  Skin: Skin is warm and dry.  Psychiatric: She has a normal mood and affect.  Nursing note and vitals  reviewed.         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  Hypertension: Uncontrolled. Currently on amlodipine 5 mg daily. Added lsoartan 50 mg daily. BMP in 1 week.  F/u in 4 weeks.  Nigel Mormon, MD [facility] [place] Pager: [phone] Office: [phone] If no answer Cell [phone]

## 2019-02-25 ENCOUNTER — Ambulatory Visit
Admission: RE | Admit: 2019-02-25 | Discharge: 2019-02-25 | Disposition: A | Discharge status: Home / Self Care | Payer: Medicare Other | Source: Ambulatory Visit | Attending: Orthopedic Surgery | Admitting: Orthopedic Surgery

## 2019-02-25 DIAGNOSIS — M5416 Radiculopathy, lumbar region: Secondary | ICD-10-CM

## 2019-02-25 DIAGNOSIS — M542 Cervicalgia: Secondary | ICD-10-CM

## 2019-03-03 ENCOUNTER — Ambulatory Visit: Payer: Medicare Other | Admitting: Orthopedic Surgery

## 2019-03-03 ENCOUNTER — Encounter: Payer: Self-pay | Admitting: Orthopedic Surgery

## 2019-03-03 ENCOUNTER — Other Ambulatory Visit: Payer: Self-pay

## 2019-03-03 VITALS — Ht 64.0 in | Wt 130.0 lb

## 2019-03-03 DIAGNOSIS — M5416 Radiculopathy, lumbar region: Secondary | ICD-10-CM

## 2019-03-04 ENCOUNTER — Encounter: Payer: Self-pay | Admitting: Orthopedic Surgery

## 2019-03-04 ENCOUNTER — Encounter: Payer: Self-pay | Admitting: Psychiatry

## 2019-03-04 ENCOUNTER — Ambulatory Visit (INDEPENDENT_AMBULATORY_CARE_PROVIDER_SITE_OTHER): Payer: Medicare Other | Admitting: General Practice

## 2019-03-04 ENCOUNTER — Ambulatory Visit (INDEPENDENT_AMBULATORY_CARE_PROVIDER_SITE_OTHER): Payer: Medicare Other | Admitting: Psychiatry

## 2019-03-04 DIAGNOSIS — F4001 Agoraphobia with panic disorder: Secondary | ICD-10-CM | POA: Diagnosis not present

## 2019-03-04 DIAGNOSIS — Z7901 Long term (current) use of anticoagulants: Secondary | ICD-10-CM

## 2019-03-04 DIAGNOSIS — F338 Other recurrent depressive disorders: Secondary | ICD-10-CM | POA: Diagnosis not present

## 2019-03-04 DIAGNOSIS — G4721 Circadian rhythm sleep disorder, delayed sleep phase type: Secondary | ICD-10-CM

## 2019-03-04 DIAGNOSIS — F411 Generalized anxiety disorder: Secondary | ICD-10-CM

## 2019-03-04 DIAGNOSIS — F331 Major depressive disorder, recurrent, moderate: Secondary | ICD-10-CM | POA: Diagnosis not present

## 2019-03-04 DIAGNOSIS — I2699 Other pulmonary embolism without acute cor pulmonale: Secondary | ICD-10-CM

## 2019-03-04 DIAGNOSIS — R7989 Other specified abnormal findings of blood chemistry: Secondary | ICD-10-CM

## 2019-03-04 LAB — BASIC METABOLIC PANEL
BUN/Creatinine Ratio: 27 (ref 12–28)
BUN: 20 mg/dL (ref 8–27)
CO2: 22 mmol/L (ref 20–29)
Calcium: 9.6 mg/dL (ref 8.7–10.3)
Chloride: 105 mmol/L (ref 96–106)
Creatinine, Ser: 0.74 mg/dL (ref 0.57–1.00)
GFR calc Af Amer: 91 mL/min/{1.73_m2} (ref >59)
GFR calc non Af Amer: 79 mL/min/{1.73_m2} (ref >59)
Glucose: 96 mg/dL (ref 65–99)
Potassium: 4.4 mmol/L (ref 3.5–5.2)
Sodium: 139 mmol/L (ref 134–144)

## 2019-03-04 LAB — POCT INR: INR: 2.8 (ref 2.0–3.0)

## 2019-03-04 MED ORDER — METHYLPHENIDATE HCL 20 MG PO TABS: 20.0000 mg | ORAL_TABLET | Freq: Two times a day (BID) | ORAL | 0 refills | Status: DC

## 2019-03-04 MED ORDER — CHOLECALCIFEROL 1.25 MG (50000 UT) PO TABS: 1.0000 | ORAL_TABLET | ORAL | 3 refills | Status: DC

## 2019-03-04 NOTE — Progress Notes (Signed)
[name] [id number] 03/03/1941 78 y.o.  Subjective:   Patient ID:  Andrea Simmons is a 78 y.o. (DOB 11-02-1940) female.  Chief Complaint:  Chief Complaint  Patient presents with  . Follow-up    Medication Management  . Depression    Medication Management    Depression        Associated symptoms include no decreased concentration and no suicidal ideas.   [name] presents  today for recent exacerbation of depression..    At visit was July 09, 2018.  Vraylar was discontinued for lack of response.  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  This was partially helpful.  When  seen Aug 15, 2018 and Ritalin was increased to 15 mg twice daily because of partial response at the lower dose.  At visit and of July 2020.  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  The Ritalin was increased again to 20 mg twice daily.  Last seen December 04, 2018.  There were no med changes.   Overall OK.  Doesn't like winter.  Seasonal depression and crying more.  Shoulder problems coming from neck problems.  ER with pain at times.  Chronic pain for 40 years after injury. Ritalin makes her talkative and gives some energy.  Now on it longer sees several benefits with Ritalin.  Wears off around supper time. Lost 10# on it.  At night cannot break habit of snacks at night.  Does not feel it kick in and wear off.  Back to old habits of staying up too late, now MN.  Needs 7-8 hours.  Always needed more than others.    I feel good taking it.  Most days feels pretty good.  Can have crying spells without reason even before the Ritalin.   Doing much more than I was.    Episode CP with ER visit in middle of night since here.  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   Past Psychiatric Medication Trials: Rozerem,  zolpidem. Duloxetine 120, Paxil 20 for years, Sertraline, fluoxetine, Wellbutrin,  Abilify weight gain and loss benefit, Was More talkative on the Abilify and the Wellbutrin. lithium 2004 SE tremor at 600mg  daily. Low dose nortriptyline, Vraylar NR, Buspar NR.  Review of Systems:  Review of Systems  Constitutional: Positive for unexpected weight change.  Musculoskeletal: Positive for arthralgias, back pain and joint swelling. Negative for neck stiffness.  Neurological: Negative for tremors, weakness and numbness.  Psychiatric/Behavioral: Positive for depression. Negative for agitation, behavioral problems, confusion, decreased concentration, dysphoric mood, hallucinations, self-injury, sleep disturbance and suicidal ideas. The patient is not nervous/anxious and is not hyperactive.    Hx anemia.    Cardiologist said she had a stiff heart.  History of pulmonary embolism and on Coumadin.  Medications: I have reviewed the patient's current medications.  Current Outpatient Medications  Medication Sig Dispense Refill  . amLODipine (NORVASC) 5 MG tablet Take 1 tablet (5 mg total) by mouth daily. 30 tablet 3  . BIOTIN PO Take by mouth.    . calcium carbonate (CALCIUM 600) 1500 (600 Ca) MG TABS tablet Take 600 mg of elemental calcium by mouth 2 (two) times daily with a meal.    . Cholecalciferol (VITAMIN D3) 3000 units TABS Take by mouth.    . Choline Fenofibrate  135 MG capsule Take 135 mg by mouth daily.      . DULoxetine (CYMBALTA) 60 MG capsule TAKE 1 CAPSULE BY MOUTH TWICE A DAY 180 capsule 0  . Ferrous Sulfate (IRON) 325 (65 Fe) MG TABS Take by mouth.    . furosemide (LASIX) 20 MG tablet Take 20 mg by mouth as needed.     Marland Kitchen levothyroxine (SYNTHROID, LEVOTHROID) 50 MCG tablet Take 50 mcg by mouth daily before breakfast.    . losartan (COZAAR) 50 MG tablet Take 1 tablet (50 mg total) by mouth daily. 30 tablet 3  . Magnesium 500 MG TABS Take by mouth.    . metFORMIN (GLUCOPHAGE-XR) 500 MG 24  hr tablet Take 1,000 mg by mouth 2 (two) times daily.   3  . methylphenidate (RITALIN) 20 MG tablet Take 1 tablet (20 mg total) by mouth 2 (two) times daily. 180 tablet 0  . Multiple Vitamin (MULTIVITAMIN) tablet Take 1 tablet by mouth daily.      . nitroGLYCERIN (NITROSTAT) 0.4 MG SL tablet Place 1 tablet (0.4 mg total) under the tongue every 5 (five) minutes as needed for chest pain. 30 tablet 3  . Omega-3 Fatty Acids (FISH OIL) 500 MG CAPS Take 1 capsule by mouth 2 (two) times daily.     Marland Kitchen omeprazole (PRILOSEC) 20 MG capsule TAKE 1 CAPSULE BY MOUTH TWICE A DAY WITH MEALS 180 capsule 0  . ONE TOUCH ULTRA TEST test strip CHECK BLOOD SUGAR TWICE DAILY 100 each 3  . ONETOUCH DELICA LANCETS 27O MISC USE AS DIRECTED TWICE DAILY 200 each 3  . RESTASIS MULTIDOSE 0.05 % ophthalmic emulsion INSTILL 1 DROP BY OPHTHALMIC ROUTE EVERY 12 HOURS INTO AFFECTED EYE(S)  6  . rosuvastatin (CRESTOR) 20 MG tablet Take 20 mg by mouth daily.     Marland Kitchen tiZANidine (ZANAFLEX) 2 MG tablet TAKE 1 TABLET BY MOUTH TWICE A DAY AS NEEDED (Patient taking differently: Take 2 mg by mouth at bedtime. ) 180 tablet 0  . warfarin (COUMADIN) 5 MG tablet TAKE AS DIRECTED BY COUMADIN CLINIC. (Patient taking differently: 5mg  on Sunday& wed, 2.5mg  All other days) 90 tablet 1   No current facility-administered medications for this visit.    Medication Side Effects: None talkative more the [facility]  Allergies:  Allergies  Allergen Reactions  . Anaprox [Naproxen Sodium]   . Lipitor [Atorvastatin]   . Morphine   . Meperidine Nausea And Vomiting  . Naproxen Sodium Nausea And Vomiting    Past Medical History:  Diagnosis Date  . Anemia   . Anxiety   . Breast cancer ([place])   . Depression   . Diabetes mellitus type II   . Fibromyalgia   . GERD (gastroesophageal reflux disease)   . Hyperlipidemia   . Hypertension   . Malignant neoplasm of breast (female), unspecified site 1993   L breast s/p mastectomy and tamoxifen x 64yrs  . Other  pulmonary embolism and infarction 2008 and 2009   chronic anticoag - LeB CC  . Unspecified hypothyroidism     Family History  Problem Relation Age of Onset  . Depression Other        Parent  . Arthritis Other        Parent, Grandparent  . Hypertension Other        Grandparent  . Hyperlipidemia Other        [place]  . Miscarriages / Stillbirths Other        Grandparent  . Stroke Other  [facility]  . Cancer Maternal Uncle        prostate  . Hypertension Maternal Grandfather   . Breast cancer Cousin   . Breast cancer Cousin     Social History   Socioeconomic History  . Marital status: Married    Spouse name: Not on file  . Number of children: 1  . Years of education: Not on file  . Highest education level: Not on file  Occupational History  . Occupation: Armed forces technical officer: RETIRED  . Occupation: Emergency planning/management officer  . Occupation: school bus driver  Tobacco Use  . Smoking status: Never Smoker  . Smokeless tobacco: Never Used  Substance and Sexual Activity  . Alcohol use: No    Alcohol/week: 0.0 standard drinks  . Drug use: No  . Sexual activity: Not on file  Other Topics Concern  . Not on file  Social History Narrative  . Not on file   Social Determinants of Health   Financial Resource Strain:   . Difficulty of Paying Living Expenses: Not on file  Food Insecurity:   . Worried About Charity fundraiser in the Last Year: Not on file  . Ran Out of Food in the Last Year: Not on file  Transportation Needs:   . Lack of Transportation (Medical): Not on file  . Lack of Transportation (Non-Medical): Not on file  Physical Activity:   . Days of Exercise per Week: Not on file  . Minutes of Exercise per Session: Not on file  Stress:   . Feeling of Stress : Not on file  Social Connections:   . Frequency of Communication with Friends and Family: Not on file  . Frequency of Social Gatherings with Friends and Family: Not on file  . Attends Religious Services: Not on  file  . Active Member of Clubs or Organizations: Not on file  . Attends Archivist Meetings: Not on file  . Marital Status: Not on file  Intimate Partner Violence:   . Fear of Current or Ex-Partner: Not on file  . Emotionally Abused: Not on file  . Physically Abused: Not on file  . Sexually Abused: Not on file    Past Medical History, Surgical history, Social history, and Family history were reviewed and updated as appropriate.   Please see review of systems for further details on the patient's review from today.   Objective:   Physical Exam:  There were no vitals taken for this visit.  Physical Exam Constitutional:      General: She is not in acute distress.    Appearance: She is well-developed.  Musculoskeletal:        General: No deformity.  Neurological:     Mental Status: She is alert and oriented to person, place, and time.     Cranial Nerves: No dysarthria.     Coordination: Coordination normal.  Psychiatric:        Attention and Perception: Attention normal. She is attentive.        Mood and Affect: Mood is depressed. Mood is not anxious. Affect is not labile, blunt, angry or inappropriate.        Speech: Speech normal.        Behavior: Behavior normal. Behavior is cooperative.        Thought Content: Thought content normal. Thought content is not paranoid or delusional. Thought content does not include homicidal or suicidal ideation. Thought content does not include homicidal or suicidal plan.  Cognition and Memory: Cognition and memory normal.        Judgment: Judgment normal.     Comments: Insight fair-good.  Better mood and close to baseline but not 100%.   Talkative     Lab Review:     Component Value Date/Time   NA 139 03/03/2019 1534   NA 141 08/01/2016 1527   K 4.4 03/03/2019 1534   K 4.6 08/01/2016 1527   CL 105 03/03/2019 1534   CO2 22 03/03/2019 1534   CO2 24 08/01/2016 1527   GLUCOSE 96 03/03/2019 1534   GLUCOSE 101 (H)  01/10/2019 1158   GLUCOSE 93 08/01/2016 1527   BUN 20 03/03/2019 1534   BUN 19.9 08/01/2016 1527   CREATININE 0.74 03/03/2019 1534   CREATININE 0.9 08/01/2016 1527   CALCIUM 9.6 03/03/2019 1534   CALCIUM 9.7 08/01/2016 1527   PROT 6.5 01/10/2019 1158   PROT 7.2 08/01/2016 1527   PROT 6.8 08/01/2016 1527   ALBUMIN 4.3 01/10/2019 1158   ALBUMIN 4.0 08/01/2016 1527   AST 40 (H) 01/10/2019 1158   AST 41 (H) 08/01/2016 1527   ALT 25 01/10/2019 1158   ALT 25 08/01/2016 1527   ALKPHOS 41 01/10/2019 1158   ALKPHOS 48 08/01/2016 1527   BILITOT 0.5 01/10/2019 1158   BILITOT 0.33 08/01/2016 1527   GFRNONAA 79 03/03/2019 1534   GFRAA 91 03/03/2019 1534       Component Value Date/Time   WBC 5.4 01/10/2019 1158   RBC 3.96 01/10/2019 1158   HGB 12.0 01/10/2019 1158   HGB 9.5 (L) 08/01/2016 1527   HCT 36.3 01/10/2019 1158   HCT 31.2 (L) 08/01/2016 1527   PLT 247.0 01/10/2019 1158   PLT 221 08/01/2016 1527   MCV 91.8 01/10/2019 1158   MCV 83.9 08/01/2016 1527   MCH 30.4 10/17/2018 1204   MCHC 33.1 01/10/2019 1158   RDW 13.5 01/10/2019 1158   RDW 17.2 (H) 08/01/2016 1527   LYMPHSABS 1.6 01/10/2019 1158   LYMPHSABS 2.0 08/01/2016 1527   MONOABS 0.4 01/10/2019 1158   MONOABS 0.4 08/01/2016 1527   EOSABS 0.0 01/10/2019 1158   EOSABS 0.1 08/01/2016 1527   BASOSABS 0.0 01/10/2019 1158   BASOSABS 0.0 08/01/2016 1527    No results found for: POCLITH, LITHIUM   No results found for: PHENYTOIN, PHENOBARB, VALPROATE, CBMZ   Endo checked thyroid lately.   Vitamin D level on April 01, 2018 reported as 40.  After that vitamin D 50,000 units weekly was prescribed with a goal of achieving levels in the 50s and 60s.  December 04, 2018 vitamin D 25 off supplement .res Assessment: Plan:    Faviola was seen today for follow-up and depression.  Diagnoses and all orders for this visit:  Major depressive disorder, recurrent episode, moderate (HCC)  Seasonal depression (HCC)  Panic  disorder with agoraphobia  Delayed sleep phase syndrome  Generalized anxiety disorder  Low vitamin D level     Greater than 50% of  30 min face to face time with patient was spent on counseling and coordination of care. We discussed several things.  She is not 100% free of depression she has residual low energy and motivation but her mood is substantially better than it was before. Clear benefit with Ritalin.  She is overall satisfied with the medication.  Discussed potential benefits, risks, and side effects of stimulants with patient to include increased heart rate, palpitations, insomnia, increased anxiety, increased irritability, or decreased appetite.  Instructed patient  to contact office if experiencing any significant tolerability issues. She does not appear manic and not sig different from the last visit. Still talkative on it but not manic.  Continue Ritalin 20 mg twice daily.  Discussed side effects again as noted in the last visit including cardiac risk Hesitate to increase further DT age, BP problems etc  Options pramipexole, Rexulti,  change to Trintellixl.  Disc SE and options.    Hesitate to change duloxetine bc likely to affect INR levels.  May ultimately have to do this if we cannot get a response from the stimulant.  Discussed that it is medically necessary to take the high dose of duloxetine.  She does not have any serotonergic side effects.  December 04, 2018 vitamin D 25 off supplement.  Restart 50K weekly.    FU 6 mos  Lynder Parents, MD, DFAPA   Please see After Visit Summary for patient specific instructions.  Future Appointments  Date Time Provider [place]  03/26/2019  2:30 PM Magnus Sinning, MD [facility] None  03/28/2019 10:30 AM Nigel Mormon, MD [facility] None  04/02/2019  1:30 PM [facility]    No orders of the defined types were placed in this encounter.     -------------------------------

## 2019-03-04 NOTE — Patient Instructions (Addendum)
Pre visit review using our clinic review tool, if applicable. No additional management support is needed unless otherwise documented below in the visit note.  Continue to take 1/2 tablet daily except 1 tablet on Sunday and Thursdays.  Re-check in 4 weeks.

## 2019-03-04 NOTE — Progress Notes (Signed)
Medical screening examination/treatment/procedure(s) were performed by non-physician practitioner and as supervising physician I was immediately available for consultation/collaboration. I agree with above. Hillel Card, MD   

## 2019-03-04 NOTE — Progress Notes (Signed)
Office Visit Note   Patient: Andrea Simmons           Date of Birth: 05-21-1940           MRN: [id number] Visit Date: 03/03/2019 Requested by: [name], MD [address] [place],  [place] [address] PCP: [name], MD  Subjective: Chief Complaint  Patient presents with  . Neck - Follow-up, Pain    MRI Cervical Spine review  . Lower Back - Pain, Follow-up    MRI Lumbar Review   . Follow-up    HPI: Andrea Simmons is a patient presents for follow-up of MRI scan of cervical spine and lumbar spine.  States at times her pain is worse.  Having more right leg pain than right arm pain.  Muscle relaxer is taken at night to help.  Her pain is definitely severe from the right hip and buttock region down to the posterior right knee.  MRI scan of the cervical spine is reviewed and it shows solid arthrodesis at C5-6 and C6-7.  At C4-5 the patient does have bilateral foraminal stenosis.  Shoulder generally is okay for now clinically.  Lumbar spine MRI shows severe central stenosis at L4-5.  Hurts her to sit and lay down.  She does have a right-sided L5-S1 foraminal disc as well.  She is on Coumadin.  Takes Tylenol for her symptoms.              ROS: All systems reviewed are negative as they relate to the chief complaint within the history of present illness.  Patient denies  fevers or chills.   Assessment & Plan: Visit Diagnoses:  1. Lumbar back pain with radiculopathy affecting left lower extremity     Plan: Impression is difficult axial spine problem for patient who is on Coumadin.  Upper extremity is manageable but lower extremity has fairly significant and progressive symptoms from spinal stenosis.  She also has a foraminal disc on the right which is likely contributing to her symptoms.  Needs to get see Dr. Zannie Kehr for lumbar spine ESI x2.  She will have to manage her Coumadin after that.  Follow-up with me as needed.  If she fails injections she should be referred for  orthopedic spine surgical or neurosurgical consultation or she would likely just have to live with this problem.  Follow-Up Instructions: No follow-ups on file.   Orders:  Orders Placed This Encounter  Procedures  . Ambulatory referral to Physical Medicine Rehab   No orders of the defined types were placed in this encounter.     Procedures: No procedures performed   Clinical Data: No additional findings.  Objective: Vital Signs: Ht 5\' 4"  (1.626 m)   Wt 130 lb (59 kg)   BMI 22.31 kg/m   Physical Exam:   Constitutional: Patient appears well-developed HEENT:  Head: Normocephalic Eyes:EOM are normal Neck: Normal range of motion Cardiovascular: Normal rate Pulmonary/chest: Effort normal Neurologic: Patient is alert Skin: Skin is warm Psychiatric: Patient has normal mood and affect    Ortho Exam: Ortho exam demonstrates slightly diminished neck range of motion with flexion extension rotation.  Patient has 5 out of 5 grip EPL FPL interosseous wrist flexion extension bicep triceps and deltoid strength.  Radial pulse intact bilaterally.  Bilateral lower extremities demonstrate positive nerve root tension sounds mildly on the right but negative on the left.  No real weakness to hip flexion bilaterally no groin pain with internal X rotation leg.  No  definite paresthesias C5-T1 or L1 S1 bilaterally.  Pedal pulses are palpable.  Specialty Comments:  No specialty comments available.  Imaging: No results found.   PMFS History: Patient Active Problem List   Diagnosis Date Noted  . Chest pain of uncertain etiology 08/65/7846  . Hx of pulmonary embolus 03/30/2017  . Physical exam 11/30/2016  . Hyperlipidemia 12/26/2013  . Encounter for therapeutic drug monitoring 04/16/2013  . 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 Long term (current) use of anticoagulants 06/30/2010  . Exertional dyspnea 11/09/2008  . Diabetes type 2, controlled ([place])  08/27/2008  . ANEMIA-NOS 08/27/2008  . Seasonal and perennial allergic rhinitis 08/27/2008  . PULMONARY NODULE 08/27/2008  . Fibromyalgia 08/27/2008  . 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 ([place]) 03/20/2007  . Hypothyroidism 03/20/2007  . Essential hypertension 03/20/2007  . Recurrent pulmonary emboli ([place]) 03/20/2007  . GERD 03/20/2007  . ESOPHAGEAL STRICTURE 03/08/2007   Past Medical History:  Diagnosis Date  . Anemia   . Anxiety   . Breast cancer ([place])   . Depression   . Diabetes mellitus type II   . Fibromyalgia   . GERD (gastroesophageal reflux disease)   . Hyperlipidemia   . Hypertension   . Malignant neoplasm of breast (female), unspecified site 1993   L breast s/p mastectomy and tamoxifen x 60yrs  . 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 Unspecified hypothyroidism     Family History  Problem Relation Age of Onset  . Depression Other        Parent  . Arthritis Other        Parent, Grandparent  . Hypertension Other        Grandparent  . Hyperlipidemia Other        [place]  . Miscarriages / Stillbirths Other        Grandparent  . Stroke Other        [facility]  . Cancer Maternal Uncle        prostate  . Hypertension Maternal Grandfather   . Breast cancer Cousin   . Breast cancer Cousin     Past Surgical History:  Procedure Laterality Date  . APPENDECTOMY    . BREAST BIOPSY Left 1993  . BREAST BIOPSY Right 09/25/2014   stero. Benign  . BREAST RECONSTRUCTION Left   . CATARACT EXTRACTION    . MASTECTOMY Left   . ROTATOR CUFF REPAIR    . SPINAL FUSION      x 2  . TOTAL ABDOMINAL HYSTERECTOMY W/ BILATERAL SALPINGOOPHORECTOMY    . TUBAL LIGATION     Social History   Occupational History  . Occupation: Armed forces technical officer: RETIRED  . Occupation: Emergency planning/management officer  . Occupation: school bus driver  Tobacco Use  . Smoking status: Never Smoker  . Smokeless tobacco: Never Used  Substance and Sexual Activity  .  Alcohol use: No    Alcohol/week: 0.0 standard drinks  . Drug use: No  . Sexual activity: Not on file

## 2019-03-05 ENCOUNTER — Ambulatory Visit: Payer: Medicare Other

## 2019-03-19 ENCOUNTER — Other Ambulatory Visit: Payer: Self-pay | Admitting: Physician Assistant

## 2019-03-26 ENCOUNTER — Ambulatory Visit: Payer: Medicare Other | Admitting: Physical Medicine and Rehabilitation

## 2019-03-26 ENCOUNTER — Ambulatory Visit: Payer: Self-pay

## 2019-03-26 ENCOUNTER — Encounter: Payer: Self-pay | Admitting: Physical Medicine and Rehabilitation

## 2019-03-26 ENCOUNTER — Other Ambulatory Visit: Payer: Self-pay

## 2019-03-26 VITALS — BP 154/84 | HR 80

## 2019-03-26 DIAGNOSIS — M48062 Spinal stenosis, lumbar region with neurogenic claudication: Secondary | ICD-10-CM

## 2019-03-26 DIAGNOSIS — M5416 Radiculopathy, lumbar region: Secondary | ICD-10-CM

## 2019-03-26 DIAGNOSIS — M5116 Intervertebral disc disorders with radiculopathy, lumbar region: Secondary | ICD-10-CM

## 2019-03-26 MED ORDER — METHYLPREDNISOLONE ACETATE 80 MG/ML IJ SUSP: 40.0000 mg | Freq: Once | INTRAMUSCULAR | Status: DC

## 2019-03-26 NOTE — Progress Notes (Signed)
Andrea Simmons - 79 y.o. female MRN [id number]  Date of birth: [birth year]  Office Visit Note: Visit Date: 03/26/2019 PCP: [name], MD Referred by: [name], MD  Subjective: Chief Complaint  Patient presents with  . Lower Back - Pain  . Right Thigh - Pain   HPI: Andrea Simmons is a 79 y.o. female who comes in today At the request of Dr. Anderson Malta for interventional spine procedure.  Patient's been having years of mostly right-sided lower back pain which refers into the right buttocks and right thigh.  This is more classically an L5 distribution.  She has had physical therapy and other conservative care without much relief.  Dr. Marlou Sa did obtain MRI of the lumbar spine which is reviewed with the patient today and reviewed below.  She has moderate to severe multifactorial stenosis at L4-5 with facet arthropathy and small listhesis.  She has lateral recess narrowing at this level as well which could cause an L5 radicular symptoms.  She also has a small right foraminal protrusion at L5 which could also give her similar symptoms.  We will complete right L5 transforaminal injection injection today to see how she does.  .  Depending on relief could look at repeat injection versus interlaminar approach in the future.  She may also benefit from lumbar facet/medial branch block and radiofrequency ablation for more of her back pain we briefly touched on that with her.  ROS Otherwise per HPI.  Assessment & Plan: Visit Diagnoses:  1. Lumbar radiculopathy   2. Radiculopathy due to lumbar intervertebral disc disorder   3. Spinal stenosis of lumbar region with neurogenic claudication     Plan: No additional findings.   Meds & Orders:  Meds ordered this encounter  Medications  . methylPREDNISolone acetate (DEPO-MEDROL) injection 40 mg    Orders Placed This Encounter  Procedures  . XR C-ARM NO REPORT  . Epidural Steroid injection    Follow-up: Return if symptoms worsen  or fail to improve.   Procedures: No procedures performed  Lumbosacral Transforaminal Epidural Steroid Injection - Sub-Pedicular Approach with Fluoroscopic Guidance  Patient: Andrea Simmons      Date of Birth: 04-09-1940 MRN: [id number] PCP: [name], MD      Visit Date: 03/26/2019   Universal Protocol:    Date/Time: 03/26/2019  Consent Given By: the patient  Position: PRONE  Additional Comments: Vital signs were monitored before and after the procedure. Patient was prepped and draped in the usual sterile fashion. The correct patient, procedure, and site was verified.   Injection Procedure Details:  Procedure Site One Meds Administered:  Meds ordered this encounter  Medications  . methylPREDNISolone acetate (DEPO-MEDROL) injection 40 mg    Laterality: Right  Location/Site:  L5-S1  Needle size: 22 G  Needle type: Spinal  Needle Placement: Transforaminal  Findings:    -Comments: Excellent flow of contrast along the nerve and into the epidural space.  Procedure Details: After squaring off the end-plates to get a true AP view, the C-arm was positioned so that an oblique view of the foramen as noted above was visualized. The target area is just inferior to the "nose of the scotty dog" or sub pedicular. The soft tissues overlying this structure were infiltrated with 2-3 ml. of 1% Lidocaine without Epinephrine.  The spinal needle was inserted toward the target using a "trajectory" view along the fluoroscope beam.  Under AP and lateral visualization, the needle was advanced so  it did not puncture dura and was located close the 6 O'Clock position of the pedical in AP tracterory. Biplanar projections were used to confirm position. Aspiration was confirmed to be negative for CSF and/or blood. A 1-2 ml. volume of Isovue-250 was injected and flow of contrast was noted at each level. Radiographs were obtained for documentation purposes.   After attaining the desired  flow of contrast documented above, a 0.5 to 1.0 ml test dose of 0.25% Marcaine was injected into each respective transforaminal space.  The patient was observed for 90 seconds post injection.  After no sensory deficits were reported, and normal lower extremity motor function was noted,   the above injectate was administered so that equal amounts of the injectate were placed at each foramen (level) into the transforaminal epidural space.   Additional Comments:  The patient tolerated the procedure well Dressing: 2 x 2 sterile gauze and Band-Aid    Post-procedure details: Patient was observed during the procedure. Post-procedure instructions were reviewed.  Patient left the clinic in stable condition.     Clinical History: MRI LUMBAR SPINE WITHOUT CONTRAST  TECHNIQUE: Multiplanar, multisequence MR imaging of the lumbar spine was performed. No intravenous contrast was administered.  COMPARISON:  Lumbar radiographs 01/27/2019  FINDINGS: Segmentation:  Normal  Alignment: Mild retrolisthesis L1-2 and L2-3. 6 mm anterolisthesis L4-5  Vertebrae:  Normal bone marrow.  Negative for fracture or mass.  Conus medullaris and cauda equina: Conus extends to the T12-L1 level. Conus and cauda equina appear normal.  Paraspinal and other soft tissues: Negative for paraspinous mass or adenopathy. Negative for soft tissue edema.  Disc levels:  L1-2: Mild disc and mild facet degeneration.  Negative for stenosis  L2-3: Mild disc bulging. Bilateral facet hypertrophy. Negative for stenosis  L3-4: Mild disc degeneration and moderate facet degeneration. No significant stenosis.  L4-5: 6 mm anterolisthesis with severe facet degeneration. Moderate to severe central canal stenosis. Moderate subarticular stenosis bilaterally  L5-S1: Bilateral facet hypertrophy. Small right foraminal disc protrusion with impingement of the right L5 nerve root.  IMPRESSION: Moderate to severe  spinal stenosis L4-5 with grade 1 anterolisthesis and severe facet degeneration  Small right foraminal disc protrusion L5-S1 with right L5 nerve root impingement.   Electronically Signed   By: Franchot Gallo M.D.   On: 02/25/2019 15:24   She reports that she has never smoked. She has never used smokeless tobacco.  Recent Labs    07/05/18 0949 10/14/18 0000  HGBA1C 6.5 6.7    Objective:  VS:  HT:    WT:   BMI:     BP:(!) 154/84  HR:80bpm  TEMP: ( )  RESP:  Physical Exam  Ortho Exam Imaging: XR C-ARM NO REPORT  Result Date: 03/26/2019 Please see Notes tab for imaging impression.   Past Medical/Family/Surgical/Social History: Medications & Allergies reviewed per EMR, new medications updated. Patient Active Problem List   Diagnosis Date Noted  . Chest pain of uncertain etiology 09/98/3382  . Hx of pulmonary embolus 03/30/2017  . Physical exam 11/30/2016  . Hyperlipidemia 12/26/2013  . Encounter for therapeutic drug monitoring 04/16/2013  . 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 Long term (current) use of anticoagulants 06/30/2010  . Exertional dyspnea 11/09/2008  . Diabetes type 2, controlled ([place]) 08/27/2008  . ANEMIA-NOS 08/27/2008  . Seasonal and perennial allergic rhinitis 08/27/2008  . PULMONARY NODULE 08/27/2008  . Fibromyalgia 08/27/2008  . 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 ([place]) 03/20/2007  . Hypothyroidism  03/20/2007  . Essential hypertension 03/20/2007  . Recurrent pulmonary emboli ([place]) 03/20/2007  . GERD 03/20/2007  . ESOPHAGEAL STRICTURE 03/08/2007   Past Medical History:  Diagnosis Date  . Anemia   . Anxiety   . Breast cancer ([place])   . Depression   . Diabetes mellitus type II   . Fibromyalgia   . GERD (gastroesophageal reflux disease)   . Hyperlipidemia   . Hypertension   . Malignant neoplasm of breast (female), unspecified site 1993   L breast s/p mastectomy and tamoxifen x 70yrs  . Other  pulmonary embolism and infarction 2008 and 2009   chronic anticoag - LeB CC  . Unspecified hypothyroidism    Family History  Problem Relation Age of Onset  . Depression Other        Parent  . Arthritis Other        Parent, Grandparent  . Hypertension Other        Grandparent  . Hyperlipidemia Other        [place]  . Miscarriages / Stillbirths Other        Grandparent  . Stroke Other        [facility]  . Cancer Maternal Uncle        prostate  . Hypertension Maternal Grandfather   . Breast cancer Cousin   . Breast cancer Cousin    Past Surgical History:  Procedure Laterality Date  . APPENDECTOMY    . BREAST BIOPSY Left 1993  . BREAST BIOPSY Right 09/25/2014   stero. Benign  . BREAST RECONSTRUCTION Left   . CATARACT EXTRACTION    . MASTECTOMY Left   . ROTATOR CUFF REPAIR    . SPINAL FUSION      x 2  . TOTAL ABDOMINAL HYSTERECTOMY W/ BILATERAL SALPINGOOPHORECTOMY    . TUBAL LIGATION     Social History   Occupational History  . Occupation: Armed forces technical officer: RETIRED  . Occupation: Emergency planning/management officer  . Occupation: school bus driver  Tobacco Use  . Smoking status: Never Smoker  . Smokeless tobacco: Never Used  Substance and Sexual Activity  . Alcohol use: No    Alcohol/week: 0.0 standard drinks  . Drug use: No  . Sexual activity: Not on file

## 2019-03-26 NOTE — Progress Notes (Signed)
.  Numeric Pain Rating Scale and Functional Assessment Average Pain 5   In the last MONTH (on 0-10 scale) has pain interfered with the following?  1. General activity like being  able to carry out your everyday physical activities such as walking, climbing stairs, carrying groceries, or moving a chair?  Rating(6)   +Driver, +BT(coumadin, ok for inj) , -Dye Allergies.

## 2019-03-27 NOTE — Procedures (Signed)
Lumbosacral Transforaminal Epidural Steroid Injection - Sub-Pedicular Approach with Fluoroscopic Guidance  Patient: Andrea Simmons      Date of Birth: September 14, 1940 MRN: [id number] PCP: [name], MD      Visit Date: 03/26/2019   Universal Protocol:    Date/Time: 03/26/2019  Consent Given By: the patient  Position: PRONE  Additional Comments: Vital signs were monitored before and after the procedure. Patient was prepped and draped in the usual sterile fashion. The correct patient, procedure, and site was verified.   Injection Procedure Details:  Procedure Site One Meds Administered:  Meds ordered this encounter  Medications  . methylPREDNISolone acetate (DEPO-MEDROL) injection 40 mg    Laterality: Right  Location/Site:  L5-S1  Needle size: 22 G  Needle type: Spinal  Needle Placement: Transforaminal  Findings:    -Comments: Excellent flow of contrast along the nerve and into the epidural space.  Procedure Details: After squaring off the end-plates to get a true AP view, the C-arm was positioned so that an oblique view of the foramen as noted above was visualized. The target area is just inferior to the "nose of the scotty dog" or sub pedicular. The soft tissues overlying this structure were infiltrated with 2-3 ml. of 1% Lidocaine without Epinephrine.  The spinal needle was inserted toward the target using a "trajectory" view along the fluoroscope beam.  Under AP and lateral visualization, the needle was advanced so it did not puncture dura and was located close the 6 O'Clock position of the pedical in AP tracterory. Biplanar projections were used to confirm position. Aspiration was confirmed to be negative for CSF and/or blood. A 1-2 ml. volume of Isovue-250 was injected and flow of contrast was noted at each level. Radiographs were obtained for documentation purposes.   After attaining the desired flow of contrast documented above, a 0.5 to 1.0 ml test dose of  0.25% Marcaine was injected into each respective transforaminal space.  The patient was observed for 90 seconds post injection.  After no sensory deficits were reported, and normal lower extremity motor function was noted,   the above injectate was administered so that equal amounts of the injectate were placed at each foramen (level) into the transforaminal epidural space.   Additional Comments:  The patient tolerated the procedure well Dressing: 2 x 2 sterile gauze and Band-Aid    Post-procedure details: Patient was observed during the procedure. Post-procedure instructions were reviewed.  Patient left the clinic in stable condition.

## 2019-03-28 ENCOUNTER — Ambulatory Visit: Payer: Medicare Other | Admitting: Cardiology

## 2019-04-02 ENCOUNTER — Other Ambulatory Visit: Payer: Self-pay

## 2019-04-02 ENCOUNTER — Ambulatory Visit: Payer: Medicare PPO | Admitting: General Practice

## 2019-04-02 DIAGNOSIS — Z7901 Long term (current) use of anticoagulants: Secondary | ICD-10-CM

## 2019-04-02 DIAGNOSIS — I2699 Other pulmonary embolism without acute cor pulmonale: Secondary | ICD-10-CM

## 2019-04-02 LAB — POCT INR: INR: 2.4 (ref 2.0–3.0)

## 2019-04-02 NOTE — Patient Instructions (Addendum)
Pre visit review using our clinic review tool, if applicable. No additional management support is needed unless otherwise documented below in the visit note.  Continue to take 1/2 tablet daily except 1 tablet on Sunday and Thursdays.  Re-check in 6 weeks.

## 2019-04-02 NOTE — Progress Notes (Signed)
I have reviewed the results and agree with this plan   

## 2019-04-09 ENCOUNTER — Encounter: Payer: Self-pay | Admitting: Cardiology

## 2019-04-09 ENCOUNTER — Other Ambulatory Visit: Payer: Self-pay

## 2019-04-09 ENCOUNTER — Ambulatory Visit: Payer: Medicare PPO | Admitting: Cardiology

## 2019-04-09 VITALS — BP 155/86 | HR 87 | Temp 98.5°F | Resp 16 | Ht 64.0 in | Wt 135.9 lb

## 2019-04-09 DIAGNOSIS — I10 Essential (primary) hypertension: Secondary | ICD-10-CM | POA: Diagnosis not present

## 2019-04-09 DIAGNOSIS — E7800 Pure hypercholesterolemia, unspecified: Secondary | ICD-10-CM | POA: Diagnosis not present

## 2019-04-09 DIAGNOSIS — E039 Hypothyroidism, unspecified: Secondary | ICD-10-CM | POA: Diagnosis not present

## 2019-04-09 DIAGNOSIS — E1165 Type 2 diabetes mellitus with hyperglycemia: Secondary | ICD-10-CM | POA: Diagnosis not present

## 2019-04-09 NOTE — Progress Notes (Signed)
Follow up visit  Subjective:   Andrea Simmons, female    DOB: [birth year], 79 y.o.   MRN: [id number]   Chief Complaint  Patient presents with  . 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  At last visit, I added losartan 50 mg. BP remains suboptimally controlled. It appears that she as previously taking Valsartan-HCTZ 320-25 mg daily. Contrary to my prior understanding, she is not on amlodipine.   Current Outpatient Medications on File Prior to Visit  Medication Sig Dispense Refill  . BIOTIN PO Take by mouth.    . calcium carbonate (CALCIUM 600) 1500 (600 Ca) MG TABS tablet Take 600 mg of elemental calcium by mouth 2 (two) times daily with a meal.    . Cholecalciferol 1.25 MG (50000 UT) TABS Take 1 tablet by mouth once a week. 10 tablet 3  . Choline Fenofibrate 135 MG capsule Take 135 mg by mouth daily.      . DULoxetine (CYMBALTA) 60 MG capsule TAKE 1 CAPSULE BY MOUTH TWICE A DAY 180 capsule 0  . Ferrous Sulfate (IRON) 325 (65 Fe) MG TABS Take by mouth.    . furosemide (LASIX) 20 MG tablet Take 20 mg by mouth as needed.     . Lancets (ONETOUCH DELICA PLUS [id number]) MISC USE AS DIRECTED TWICE DAILY 200 each 3  . levothyroxine (SYNTHROID, LEVOTHROID) 50 MCG tablet Take 50 mcg by mouth daily before breakfast.    . losartan (COZAAR) 50 MG tablet Take 1 tablet (50 mg total) by mouth daily. 30 tablet 3  . Magnesium 500 MG TABS Take by mouth.    . metFORMIN (GLUCOPHAGE-XR) 500 MG 24 hr tablet Take 1,000 mg by mouth 2 (two) times daily.   3  . methylphenidate (RITALIN) 20 MG tablet Take 1 tablet (20 mg total) by mouth 2 (two) times daily. 180 tablet 0  . Multiple Vitamin (MULTIVITAMIN) tablet Take 1 tablet by mouth daily.      . Omega-3 Fatty Acids (FISH OIL) 500 MG CAPS Take 1 capsule by mouth 2 (two)  times daily.     Marland Kitchen omeprazole (PRILOSEC) 20 MG capsule TAKE 1 CAPSULE BY MOUTH TWICE A DAY WITH MEALS (Patient taking differently: 20 mg daily. ) 180 capsule 0  . ONE TOUCH ULTRA TEST test strip CHECK BLOOD SUGAR TWICE DAILY 100 each 3  . RESTASIS MULTIDOSE 0.05 % ophthalmic emulsion INSTILL 1 DROP BY OPHTHALMIC ROUTE EVERY 12 HOURS INTO AFFECTED EYE(S)  6  . rosuvastatin (CRESTOR) 20 MG tablet Take 20 mg by mouth daily.     Marland Kitchen tiZANidine (ZANAFLEX) 2 MG tablet TAKE 1 TABLET BY MOUTH TWICE A DAY AS NEEDED (Patient taking differently: Take 2 mg by mouth at bedtime. ) 180 tablet 0  . warfarin (COUMADIN) 5 MG tablet TAKE AS DIRECTED BY COUMADIN CLINIC. (Patient taking differently: 46m on Sunday& wed, 2.516mAll other days) 90 tablet 1  . nitroGLYCERIN (NITROSTAT) 0.4 MG SL tablet Place 1 tablet (0.4 mg total) under the tongue every 5 (five) minutes as needed for chest pain. 30 tablet 3   Current Facility-Administered Medications on File Prior to Visit  Medication Dose Route Frequency Provider Last Rate Last Admin  . methylPREDNISolone acetate (DEPO-MEDROL) injection 40 mg  40 mg Other Once NeMagnus SinningMD        Cardiovascular studies:  EKG 10/28/2018: Sinus rhythm 77 bpm. Right bundle branch block  Left anterior fascicular block. Consider RV hypertrophy. No change compared to previous EKG's.  EKG 10/17/2018: Sinus rhythm RBBB and LAFB  Lexiscan myoview stress test 01/30/2018: 1. The resting electrocardiogram demonstrated normal sinus rhythm, LAD, LAFB, RBBB and no resting arrhythmias.  The stress electrocardiogram was non-diagnostic due to pharmacologic stress. Stress symptoms included dyspnea. Resting BP 166/86 and peak BP 202/88 mm Hg.  2. Myocardial perfusion imaging is normal. Overall left ventricular systolic function was normal without regional wall motion abnormalities. The left ventricular ejection fraction was 56%.  This is a low risk study.  Echocardiogram 01/28/2018:  Left  ventricle cavity is normal in size. Moderate concentric hypertrophy of the left ventricle. Normal global wall motion. Doppler evidence of grade I (impaired) diastolic dysfunction, normal LAP. Calculated EF 55%. Left atrial cavity is mildly dilated. Mild (Grade I) aortic regurgitation. Mild (Grade I) mitral regrgitation. Trace tricuspid regurgitation. Inadequate tricuspid regurgitation jet to estimate pulmonary artery pressure. Normal right atrial pressure.  Recent labs: 10-02/2019: Glucose 96, BUN/Cr 20/0.74. EGFR 79. Na/K 139/4.4. Rest of the CMP normal H/H 12/36. MCV 91. Platelets 247 Chol 133, TG 119, HDL 38, LDL 71 TSH 0.9 normal   10/17/2018: Glucose 98, BUN/Cr 20/0.8. EGFR >60. Na/K 140/4.2. Rest of the CMP normal   Review of Systems  Cardiovascular: Negative for chest pain, dyspnea on exertion, leg swelling, palpitations and syncope.        Vitals:   04/09/19 1425  BP: (!) 155/86  Pulse: 87  Resp: 16  Temp: 98.5 F (36.9 C)  SpO2: 96%    Body mass index is 23.33 kg/m. Filed Weights   04/09/19 1425  Weight: 135 lb 14.4 oz (61.6 kg)     Objective:   Physical Exam  Constitutional: She appears well-developed and well-nourished.  Neck: No JVD present.  Cardiovascular: Normal rate, regular rhythm, normal heart sounds and intact distal pulses.  No murmur heard. Pulmonary/Chest: Effort normal and breath sounds normal. She has no wheezes. She has no rales.  Musculoskeletal:        General: No edema.  Nursing note and vitals reviewed.       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 Stop losrtan 50. Start 1/2 tab of valsartan-HCTZ 32-25 mg (Patient has a bottle of this). F/u in 4 weeks. If BP remains uncontrolled, increase to 1 tab of 320-25  mg daily.  Nigel Mormon, MD [facility] Pager: [phone] Office: [phone] If no answer Cell [phone]

## 2019-04-17 DIAGNOSIS — E039 Hypothyroidism, unspecified: Secondary | ICD-10-CM | POA: Diagnosis not present

## 2019-04-17 DIAGNOSIS — G609 Hereditary and idiopathic neuropathy, unspecified: Secondary | ICD-10-CM | POA: Diagnosis not present

## 2019-04-17 DIAGNOSIS — E7800 Pure hypercholesterolemia, unspecified: Secondary | ICD-10-CM | POA: Diagnosis not present

## 2019-04-17 DIAGNOSIS — E1165 Type 2 diabetes mellitus with hyperglycemia: Secondary | ICD-10-CM | POA: Diagnosis not present

## 2019-04-17 DIAGNOSIS — F329 Major depressive disorder, single episode, unspecified: Secondary | ICD-10-CM | POA: Diagnosis not present

## 2019-04-17 DIAGNOSIS — I10 Essential (primary) hypertension: Secondary | ICD-10-CM | POA: Diagnosis not present

## 2019-04-17 DIAGNOSIS — M810 Age-related osteoporosis without current pathological fracture: Secondary | ICD-10-CM | POA: Diagnosis not present

## 2019-04-22 ENCOUNTER — Telehealth: Payer: Self-pay | Admitting: Physical Medicine and Rehabilitation

## 2019-04-22 NOTE — Telephone Encounter (Signed)
Called patient's home phone x2, but that number was "not accepting my call." Left message #1 on mobile.

## 2019-04-22 NOTE — Telephone Encounter (Signed)
We can repeat injection but f/up with Dr. Marlou Sa for hands

## 2019-05-07 ENCOUNTER — Telehealth: Payer: Medicare PPO | Admitting: Cardiology

## 2019-05-07 ENCOUNTER — Other Ambulatory Visit: Payer: Self-pay

## 2019-05-07 VITALS — BP 169/94 | HR 75

## 2019-05-07 DIAGNOSIS — I10 Essential (primary) hypertension: Secondary | ICD-10-CM | POA: Diagnosis not present

## 2019-05-07 MED ORDER — VALSARTAN-HYDROCHLOROTHIAZIDE 320-25 MG PO TABS: 1.0000 | ORAL_TABLET | Freq: Every day | ORAL | 3 refills | Status: DC

## 2019-05-07 NOTE — Progress Notes (Signed)
Follow up visit  Subjective:   Andrea Simmons, female    DOB: [birth year], 79 y.o.   MRN: [id number]   Chief Complaint  Patient presents with  . 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 Blood pressure remains elevated. No other complaints today.   Current Outpatient Medications on File Prior to Visit  Medication Sig Dispense Refill  . BIOTIN PO Take by mouth.    . calcium carbonate (CALCIUM 600) 1500 (600 Ca) MG TABS tablet Take 600 mg of elemental calcium by mouth 2 (two) times daily with a meal.    . Cholecalciferol 1.25 MG (50000 UT) TABS Take 1 tablet by mouth once a week. 10 tablet 3  . Choline Fenofibrate 135 MG capsule Take 135 mg by mouth daily.      . DULoxetine (CYMBALTA) 60 MG capsule TAKE 1 CAPSULE BY MOUTH TWICE A DAY 180 capsule 0  . Ferrous Sulfate (IRON) 325 (65 Fe) MG TABS Take by mouth.    . furosemide (LASIX) 20 MG tablet Take 20 mg by mouth as needed.     . Lancets (ONETOUCH DELICA PLUS [id number]) MISC USE AS DIRECTED TWICE DAILY 200 each 3  . levothyroxine (SYNTHROID, LEVOTHROID) 50 MCG tablet Take 50 mcg by mouth daily before breakfast.    . losartan (COZAAR) 50 MG tablet Take 1 tablet (50 mg total) by mouth daily. 30 tablet 3  . Magnesium 500 MG TABS Take by mouth.    . metFORMIN (GLUCOPHAGE-XR) 500 MG 24 hr tablet Take 1,000 mg by mouth 2 (two) times daily.   3  . methylphenidate (RITALIN) 20 MG tablet Take 1 tablet (20 mg total) by mouth 2 (two) times daily. 180 tablet 0  . Multiple Vitamin (MULTIVITAMIN) tablet Take 1 tablet by mouth daily.      . nitroGLYCERIN (NITROSTAT) 0.4 MG SL tablet Place 1 tablet (0.4 mg total) under the tongue every 5 (five) minutes as needed for chest pain. 30 tablet 3  . Omega-3 Fatty Acids (FISH OIL) 500 MG CAPS  Take 1 capsule by mouth 2 (two) times daily.     Marland Kitchen omeprazole (PRILOSEC) 20 MG capsule TAKE 1 CAPSULE BY MOUTH TWICE A DAY WITH MEALS (Patient taking differently: 20 mg daily. ) 180 capsule 0  . ONE TOUCH ULTRA TEST test strip CHECK BLOOD SUGAR TWICE DAILY 100 each 3  . RESTASIS MULTIDOSE 0.05 % ophthalmic emulsion INSTILL 1 DROP BY OPHTHALMIC ROUTE EVERY 12 HOURS INTO AFFECTED EYE(S)  6  . rosuvastatin (CRESTOR) 20 MG tablet Take 20 mg by mouth daily.     Marland Kitchen tiZANidine (ZANAFLEX) 2 MG tablet TAKE 1 TABLET BY MOUTH TWICE A DAY AS NEEDED (Patient taking differently: Take 2 mg by mouth at bedtime. ) 180 tablet 0  . warfarin (COUMADIN) 5 MG tablet TAKE AS DIRECTED BY COUMADIN CLINIC. (Patient taking differently: 26m on Sunday& wed, 2.564mAll other days) 90 tablet 1   Current Facility-Administered Medications on File Prior to Visit  Medication Dose Route Frequency Provider Last Rate Last Admin  . methylPREDNISolone acetate (DEPO-MEDROL) injection 40 mg  40 mg Other Once NeMagnus SinningMD        Cardiovascular studies:  EKG 10/28/2018: Sinus rhythm 77 bpm. Right bundle branch block  Left anterior fascicular block. Consider RV hypertrophy. No change compared to previous EKG's.  EKG 10/17/2018: Sinus rhythm RBBB and LAFB  Lexiscan myoview stress test 01/30/2018: 1. The resting electrocardiogram demonstrated normal sinus rhythm, LAD, LAFB, RBBB and no resting arrhythmias.  The stress electrocardiogram was non-diagnostic due to pharmacologic stress. Stress symptoms included dyspnea. Resting BP 166/86 and peak BP 202/88 mm Hg.  2. Myocardial perfusion imaging is normal. Overall left ventricular systolic function was normal without regional wall motion abnormalities. The left ventricular ejection fraction was 56%.  This is a low risk study.  Echocardiogram 01/28/2018:  Left ventricle cavity is normal in size. Moderate concentric hypertrophy of the left ventricle. Normal global wall motion.  Doppler evidence of grade I (impaired) diastolic dysfunction, normal LAP. Calculated EF 55%. Left atrial cavity is mildly dilated. Mild (Grade I) aortic regurgitation. Mild (Grade I) mitral regrgitation. Trace tricuspid regurgitation. Inadequate tricuspid regurgitation jet to estimate pulmonary artery pressure. Normal right atrial pressure.  Recent labs: 10-02/2019: Glucose 96, BUN/Cr 20/0.74. EGFR 79. Na/K 139/4.4. Rest of the CMP normal H/H 12/36. MCV 91. Platelets 247 Chol 133, TG 119, HDL 38, LDL 71 TSH 0.9 normal   10/17/2018: Glucose 98, BUN/Cr 20/0.8. EGFR >60. Na/K 140/4.2. Rest of the CMP normal   Review of Systems  Cardiovascular: Negative for chest pain, dyspnea on exertion, leg swelling, palpitations and syncope.        Vitals:   04/22/19 1343 05/07/19 1341  BP: (!) 150/73 (!) 169/94  Pulse:  75      Objective:   Physical Exam  Constitutional: She appears well-developed and well-nourished.  Neck: No JVD present.  Cardiovascular: Normal rate, regular rhythm, normal heart sounds and intact distal pulses.  No murmur heard. Pulmonary/Chest: Effort normal and breath sounds normal. She has no wheezes. She has no rales.  Musculoskeletal:        General: No edema.  Nursing note and vitals reviewed.       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 Increase valsartan-HCTZ 320-25 mg to 1 tab daily.  Check BMP in 1-2 weeks. F/u in 4 weeks. If BP remains uncontrolled at that time, will add amlodipine 5 mg daily.  Nigel Mormon, MD [facility] Pager: [phone] Office: [phone] If no answer Cell [phone]

## 2019-05-13 ENCOUNTER — Other Ambulatory Visit: Payer: Self-pay

## 2019-05-14 ENCOUNTER — Ambulatory Visit: Payer: Medicare PPO | Admitting: General Practice

## 2019-05-14 DIAGNOSIS — Z7901 Long term (current) use of anticoagulants: Secondary | ICD-10-CM

## 2019-05-14 DIAGNOSIS — I2699 Other pulmonary embolism without acute cor pulmonale: Secondary | ICD-10-CM

## 2019-05-14 LAB — POCT INR: INR: 2.1 (ref 2.0–3.0)

## 2019-05-14 NOTE — Patient Instructions (Signed)
Pre visit review using our clinic review tool, if applicable. No additional management support is needed unless otherwise documented below in the visit note.  Continue to take 1/2 tablet daily except 1 tablet on Sunday and Thursdays.  Re-check in 6 weeks.

## 2019-05-14 NOTE — Progress Notes (Signed)
I have reviewed the results and agree with this plan   

## 2019-05-21 NOTE — Telephone Encounter (Signed)
Patient has not returned call. Closing encounter. °

## 2019-05-25 ENCOUNTER — Other Ambulatory Visit: Payer: Self-pay | Admitting: Psychiatry

## 2019-05-26 ENCOUNTER — Other Ambulatory Visit: Payer: Medicare PPO

## 2019-05-26 ENCOUNTER — Ambulatory Visit (INDEPENDENT_AMBULATORY_CARE_PROVIDER_SITE_OTHER): Payer: Medicare PPO | Admitting: Family Medicine

## 2019-05-26 DIAGNOSIS — R309 Painful micturition, unspecified: Secondary | ICD-10-CM | POA: Diagnosis not present

## 2019-05-26 LAB — POC URINALSYSI DIPSTICK (AUTOMATED)
Bilirubin, UA: NEGATIVE
Blood, UA: NEGATIVE
Glucose, UA: NEGATIVE
Ketones, UA: NEGATIVE
Nitrite, UA: NEGATIVE
Protein, UA: NEGATIVE
Spec Grav, UA: 1.025 (ref 1.010–1.025)
Urobilinogen, UA: 0.2 E.U./dL
pH, UA: 5.5 (ref 5.0–8.0)

## 2019-05-26 NOTE — Progress Notes (Signed)
Patient was scheduled for a telephone visit today.  Patient's number was called numerous times however patient never picked up.  She was not seen or evaluated today.  Andrea Simmons. Jerline Pain, MD 05/26/2019 4:11 PM

## 2019-05-27 LAB — URINE CULTURE
MICRO NUMBER:: 10226071
SPECIMEN QUALITY:: ADEQUATE

## 2019-05-28 DIAGNOSIS — I10 Essential (primary) hypertension: Secondary | ICD-10-CM | POA: Diagnosis not present

## 2019-05-28 NOTE — Progress Notes (Signed)
Please inform patient of the following:  Urine culture negative.  Recommend she follow up with PCP if still having issues.  Algis Greenhouse. Jerline Pain, MD 05/28/2019 7:54 AM

## 2019-05-29 LAB — BASIC METABOLIC PANEL
BUN/Creatinine Ratio: 20 (ref 12–28)
BUN: 17 mg/dL (ref 8–27)
CO2: 22 mmol/L (ref 20–29)
Calcium: 9.8 mg/dL (ref 8.7–10.3)
Chloride: 103 mmol/L (ref 96–106)
Creatinine, Ser: 0.85 mg/dL (ref 0.57–1.00)
GFR calc Af Amer: 76 mL/min/{1.73_m2} (ref >59)
GFR calc non Af Amer: 66 mL/min/{1.73_m2} (ref >59)
Glucose: 119 mg/dL — ABNORMAL HIGH (ref 65–99)
Potassium: 4.5 mmol/L (ref 3.5–5.2)
Sodium: 140 mmol/L (ref 134–144)

## 2019-06-04 ENCOUNTER — Telehealth: Payer: Medicare PPO | Admitting: Cardiology

## 2019-06-04 ENCOUNTER — Other Ambulatory Visit: Payer: Self-pay

## 2019-06-04 ENCOUNTER — Encounter: Payer: Self-pay | Admitting: Cardiology

## 2019-06-04 VITALS — BP 159/76 | HR 73 | Ht 64.0 in | Wt 135.0 lb

## 2019-06-04 DIAGNOSIS — R600 Localized edema: Secondary | ICD-10-CM

## 2019-06-04 DIAGNOSIS — I10 Essential (primary) hypertension: Secondary | ICD-10-CM

## 2019-06-04 HISTORY — DX: Localized edema: R60.0

## 2019-06-04 MED ORDER — AMLODIPINE BESYLATE 5 MG PO TABS: 5.0000 mg | ORAL_TABLET | Freq: Every day | ORAL | 3 refills | Status: DC

## 2019-06-04 MED ORDER — FUROSEMIDE 20 MG PO TABS: 20.0000 mg | ORAL_TABLET | ORAL | 3 refills | Status: DC | PRN

## 2019-06-04 NOTE — Progress Notes (Signed)
Follow up visit  Subjective:   Andrea Simmons, female    DOB: December 22, 1940, 79 y.o.   MRN: [id number]   Chief Complaint  Patient presents with  . Hypertension  . 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 No complaints today. Below is the list of her recent blood pressure & HR readings.   Date: SBP/DBP  HR bpm 2/19: 165/82 102 2/22: 180/90 90 2/23: 176/83 84 2/25: 140/83 70 2/28: 165/85 67 3/1: 120/90 84 3/2: 173/84 94 3/9:  167/78 66 3/12: 170/81 77 3/13: 138/79 88 3/15: 143/80 77 3/16: 172/84 91 3/17: 159/73 79   Current Outpatient Medications on File Prior to Visit  Medication Sig Dispense Refill  . BIOTIN PO Take by mouth.    . calcium carbonate (CALCIUM 600) 1500 (600 Ca) MG TABS tablet Take 600 mg of elemental calcium by mouth 2 (two) times daily with a meal.    . Cholecalciferol 1.25 MG (50000 UT) TABS Take 1 tablet by mouth once a week. 10 tablet 3  . Choline Fenofibrate 135 MG capsule Take 135 mg by mouth daily.      . DULoxetine (CYMBALTA) 60 MG capsule TAKE 1 CAPSULE BY MOUTH TWICE A DAY 180 capsule 1  . Ferrous Sulfate (IRON) 325 (65 Fe) MG TABS Take by mouth.    . Lancets (ONETOUCH DELICA PLUS [id number]) MISC USE AS DIRECTED TWICE DAILY 200 each 3  . levothyroxine (SYNTHROID, LEVOTHROID) 50 MCG tablet Take 50 mcg by mouth daily before breakfast.    . Magnesium 500 MG TABS Take by mouth.    . metFORMIN (GLUCOPHAGE-XR) 500 MG 24 hr tablet Take 1,000 mg by mouth 2 (two) times daily.   3  . methylphenidate (RITALIN) 20 MG tablet Take 1 tablet (20 mg total) by mouth 2 (two) times daily. 180 tablet 0  . Multiple Vitamin (MULTIVITAMIN) tablet Take 1 tablet by mouth daily.      . nitroGLYCERIN (NITROSTAT) 0.4 MG SL tablet Place 1 tablet (0.4 mg total)  under the tongue every 5 (five) minutes as needed for chest pain. 30 tablet 3  . Omega-3 Fatty Acids (FISH OIL) 500 MG CAPS Take 1 capsule by mouth 2 (two) times daily.     Marland Kitchen omeprazole (PRILOSEC) 20 MG capsule TAKE 1 CAPSULE BY MOUTH TWICE A DAY WITH MEALS (Patient taking differently: Take 20 mg by mouth daily. ) 180 capsule 0  . ONE TOUCH ULTRA TEST test strip CHECK BLOOD SUGAR TWICE DAILY 100 each 3  . RESTASIS MULTIDOSE 0.05 % ophthalmic emulsion INSTILL 1 DROP BY OPHTHALMIC ROUTE EVERY 12 HOURS INTO AFFECTED EYE(S)  6  . rosuvastatin (CRESTOR) 20 MG tablet Take 20 mg by mouth daily.     Marland Kitchen tiZANidine (ZANAFLEX) 2 MG tablet TAKE 1 TABLET BY MOUTH TWICE A DAY AS NEEDED (Patient taking differently: Take 2 mg by mouth at bedtime. ) 180 tablet 0  . valsartan-hydrochlorothiazide (DIOVAN-HCT) 320-25 MG tablet Take 1 tablet by mouth daily. 30 tablet 3  . warfarin (COUMADIN) 5 MG tablet TAKE AS DIRECTED BY COUMADIN CLINIC. (Patient taking differently: 49m on Sunday& wed, 2.540mAll other days) 90 tablet 1  . furosemide (LASIX) 20 MG tablet Take 20 mg by mouth as needed.     . Vitamin D, Ergocalciferol, (DRISDOL) 1.25 MG (50000 UNIT)  CAPS capsule      Current Facility-Administered Medications on File Prior to Visit  Medication Dose Route Frequency Provider Last Rate Last Admin  . methylPREDNISolone acetate (DEPO-MEDROL) injection 40 mg  40 mg Other Once Magnus Sinning, MD        Cardiovascular studies:  EKG 10/28/2018: Sinus rhythm 77 bpm. Right bundle branch block  Left anterior fascicular block. Consider RV hypertrophy. No change compared to previous EKG's.  EKG 10/17/2018: Sinus rhythm RBBB and LAFB  Lexiscan myoview stress test 01/30/2018: 1. The resting electrocardiogram demonstrated normal sinus rhythm, LAD, LAFB, RBBB and no resting arrhythmias.  The stress electrocardiogram was non-diagnostic due to pharmacologic stress. Stress symptoms included dyspnea. Resting BP 166/86 and peak  BP 202/88 mm Hg.  2. Myocardial perfusion imaging is normal. Overall left ventricular systolic function was normal without regional wall motion abnormalities. The left ventricular ejection fraction was 56%.  This is a low risk study.  Echocardiogram 01/28/2018:  Left ventricle cavity is normal in size. Moderate concentric hypertrophy of the left ventricle. Normal global wall motion. Doppler evidence of grade I (impaired) diastolic dysfunction, normal LAP. Calculated EF 55%. Left atrial cavity is mildly dilated. Mild (Grade I) aortic regurgitation. Mild (Grade I) mitral regrgitation. Trace tricuspid regurgitation. Inadequate tricuspid regurgitation jet to estimate pulmonary artery pressure. Normal right atrial pressure.  Recent labs: 05/28/2019: Glucose 119, BUN/Cr 17/0.85. EGFR 66. Na/K 140/4.5.   10-02/2019: Glucose 96, BUN/Cr 20/0.74. EGFR 79. Na/K 139/4.4. Rest of the CMP normal H/H 12/36. MCV 91. Platelets 247 Chol 133, TG 119, HDL 38, LDL 71 TSH 0.9 normal   10/17/2018: Glucose 98, BUN/Cr 20/0.8. EGFR >60. Na/K 140/4.2. Rest of the CMP normal   Review of Systems  Cardiovascular: Negative for chest pain, dyspnea on exertion, leg swelling, palpitations and syncope.        Vitals:   06/04/19 1124  BP: (!) 159/76  Pulse: 73      Objective:   Physical Exam   Not performed. Telephone visit.      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 Conitnue valsartan-HCTZ 320-25 mg to 1 tab daily.  Added amlodipine 5 mg daily.  Referred to pharmacist guided remote patient monitoring.  Leg edema: Occasional. As needed lasix use.   F/u w/me in 3 months.   Nigel Mormon, MD [facility] Pager: [phone] Office: [phone] If no answer Cell  [phone]

## 2019-06-07 ENCOUNTER — Encounter (HOSPITAL_COMMUNITY): Payer: Self-pay

## 2019-06-07 ENCOUNTER — Emergency Department (HOSPITAL_COMMUNITY): Payer: Medicare PPO

## 2019-06-07 ENCOUNTER — Emergency Department (HOSPITAL_COMMUNITY)
Admission: EM | Admit: 2019-06-07 | Discharge: 2019-06-07 | Disposition: A | Discharge status: Home / Self Care | Payer: Medicare PPO | Attending: Emergency Medicine | Admitting: Emergency Medicine

## 2019-06-07 ENCOUNTER — Other Ambulatory Visit: Payer: Self-pay

## 2019-06-07 DIAGNOSIS — E119 Type 2 diabetes mellitus without complications: Secondary | ICD-10-CM | POA: Insufficient documentation

## 2019-06-07 DIAGNOSIS — S0990XA Unspecified injury of head, initial encounter: Secondary | ICD-10-CM | POA: Insufficient documentation

## 2019-06-07 DIAGNOSIS — M19012 Primary osteoarthritis, left shoulder: Secondary | ICD-10-CM | POA: Diagnosis not present

## 2019-06-07 DIAGNOSIS — S40012A Contusion of left shoulder, initial encounter: Secondary | ICD-10-CM | POA: Insufficient documentation

## 2019-06-07 DIAGNOSIS — S8992XA Unspecified injury of left lower leg, initial encounter: Secondary | ICD-10-CM | POA: Diagnosis not present

## 2019-06-07 DIAGNOSIS — M25562 Pain in left knee: Secondary | ICD-10-CM | POA: Diagnosis not present

## 2019-06-07 DIAGNOSIS — Z79899 Other long term (current) drug therapy: Secondary | ICD-10-CM | POA: Insufficient documentation

## 2019-06-07 DIAGNOSIS — Y93E9 Activity, other interior property and clothing maintenance: Secondary | ICD-10-CM | POA: Diagnosis not present

## 2019-06-07 DIAGNOSIS — Y92000 Kitchen of unspecified non-institutional (private) residence as  the place of occurrence of the external cause: Secondary | ICD-10-CM | POA: Diagnosis not present

## 2019-06-07 DIAGNOSIS — S8002XA Contusion of left knee, initial encounter: Secondary | ICD-10-CM | POA: Diagnosis not present

## 2019-06-07 DIAGNOSIS — S199XXA Unspecified injury of neck, initial encounter: Secondary | ICD-10-CM | POA: Diagnosis not present

## 2019-06-07 DIAGNOSIS — Y999 Unspecified external cause status: Secondary | ICD-10-CM | POA: Insufficient documentation

## 2019-06-07 DIAGNOSIS — W11XXXA Fall on and from ladder, initial encounter: Secondary | ICD-10-CM | POA: Insufficient documentation

## 2019-06-07 DIAGNOSIS — E039 Hypothyroidism, unspecified: Secondary | ICD-10-CM | POA: Insufficient documentation

## 2019-06-07 DIAGNOSIS — Z7984 Long term (current) use of oral hypoglycemic drugs: Secondary | ICD-10-CM | POA: Diagnosis not present

## 2019-06-07 DIAGNOSIS — Z7901 Long term (current) use of anticoagulants: Secondary | ICD-10-CM | POA: Insufficient documentation

## 2019-06-07 DIAGNOSIS — Z853 Personal history of malignant neoplasm of breast: Secondary | ICD-10-CM | POA: Insufficient documentation

## 2019-06-07 DIAGNOSIS — I10 Essential (primary) hypertension: Secondary | ICD-10-CM | POA: Insufficient documentation

## 2019-06-07 DIAGNOSIS — W19XXXA Unspecified fall, initial encounter: Secondary | ICD-10-CM

## 2019-06-07 NOTE — ED Notes (Signed)
Pt fell off step ladder and hit left side of body on stove. Pt c/o pain to neck, left shoulder, left knee.

## 2019-06-07 NOTE — Discharge Instructions (Addendum)
Tylenol for pain.  Follow-up with your doctor if any problem

## 2019-06-07 NOTE — ED Triage Notes (Signed)
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 Said all her weight hit her left leg but she did hit her head on the oven handle. Denies LOC . Husband pressured pt to come

## 2019-06-10 NOTE — ED Provider Notes (Signed)
[facility] EMERGENCY DEPARTMENT Provider Note   CSN: [id number] Arrival date & time: 06/07/19  1931     History Chief Complaint  Patient presents with  . Fall    Andrea Simmons is a 79 y.o. female.  Patient states she fell off a ladder and hit her left knee and left shoulder.  She does not think she hit her head.  Patient is taking blood thinners  The history is provided by the patient. No language interpreter was used.  Fall This is a new problem. The current episode started 12 to 24 hours ago. The problem occurs rarely. The problem has been resolved. Pertinent negatives include no chest pain, no abdominal pain and no headaches. Nothing aggravates the symptoms. Nothing relieves the symptoms. She has tried nothing for the symptoms. The treatment provided no relief.       Past Medical History:  Diagnosis Date  . Anemia   . Anxiety   . Breast cancer ([place])   . Depression   . Diabetes mellitus type II   . Fibromyalgia   . GERD (gastroesophageal reflux disease)   . Hyperlipidemia   . Hypertension   . Malignant neoplasm of breast (female), unspecified site 1993   L breast s/p mastectomy and tamoxifen x 19yrs  . 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 Unspecified hypothyroidism     Patient Active Problem List   Diagnosis Date Noted  . Leg edema 06/04/2019  . Chest pain of uncertain etiology 42/70/6237  . Hx of pulmonary embolus 03/30/2017  . Physical exam 11/30/2016  . Hyperlipidemia 12/26/2013  . Encounter for therapeutic drug monitoring 04/16/2013  . 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 Long term (current) use of anticoagulants 06/30/2010  . Exertional dyspnea 11/09/2008  . Diabetes type 2, controlled ([place]) 08/27/2008  . ANEMIA-NOS 08/27/2008  . Seasonal and perennial allergic rhinitis 08/27/2008  . PULMONARY NODULE 08/27/2008  . Fibromyalgia 08/27/2008  . Malignant neoplasm of  female breast ([place]) 03/20/2007  . Hypothyroidism 03/20/2007  . Essential hypertension 03/20/2007  . 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 GERD 03/20/2007  . ESOPHAGEAL STRICTURE 03/08/2007    Past Surgical History:  Procedure Laterality Date  . APPENDECTOMY    . BREAST BIOPSY Left 1993  . BREAST BIOPSY Right 09/25/2014   stero. Benign  . BREAST RECONSTRUCTION Left   . CATARACT EXTRACTION    . MASTECTOMY Left   . ROTATOR CUFF REPAIR    . SPINAL FUSION      x 2  . TOTAL ABDOMINAL HYSTERECTOMY W/ BILATERAL SALPINGOOPHORECTOMY    . TUBAL LIGATION       OB History   No obstetric history on file.     Family History  Problem Relation Age of Onset  . Depression Other        Parent  . Arthritis Other        Parent, Grandparent  . Hypertension Other        Grandparent  . Hyperlipidemia Other        [place]  . Miscarriages / Stillbirths Other        Grandparent  . Stroke Other        [facility]  . Cancer Maternal Uncle        prostate  . Hypertension Maternal Grandfather   . Breast cancer Cousin   . Breast cancer Cousin     Social History  Tobacco Use  . Smoking status: Never Smoker  . Smokeless tobacco: Never Used  Substance Use Topics  . Alcohol use: No    Alcohol/week: 0.0 standard drinks  . Drug use: No    Home Medications Prior to Admission medications   Medication Sig Start Date End Date Taking? Authorizing Provider  amLODipine (NORVASC) 5 MG tablet Take 1 tablet (5 mg total) by mouth daily. 06/04/19 09/02/19 Yes [name], [name], MD  BIOTIN PO Take by mouth.   Yes [provider]  calcium carbonate (CALCIUM 600) 1500 (600 Ca) MG TABS tablet Take 600 mg of elemental calcium by mouth 2 (two) times daily with a meal.   Yes [provider]  Cholecalciferol 1.25 MG (50000 UT) TABS Take 1 tablet by mouth once a week. 03/04/19  Yes [name], Billey Co., MD  Choline Fenofibrate 135 MG capsule Take 135 mg by mouth daily.     Yes [provider]  DULoxetine (CYMBALTA) 60 MG capsule TAKE 1 CAPSULE BY MOUTH TWICE A DAY 05/26/19  Yes [name], Billey Co., MD  Ferrous Sulfate (IRON) 325 (65 Fe) MG TABS Take by mouth.   Yes [provider]  furosemide (LASIX) 20 MG tablet Take 1 tablet (20 mg total) by mouth as needed. 06/04/19  Yes [name], Reynold Bowen, MD  Lancets (ONETOUCH DELICA PLUS [id number]) MISC USE AS DIRECTED TWICE DAILY 03/19/19  Yes [name], MD  levothyroxine (SYNTHROID, LEVOTHROID) 50 MCG tablet Take 50 mcg by mouth daily before breakfast.   Yes [provider]  Magnesium 500 MG TABS Take by mouth.   Yes [provider]  metFORMIN (GLUCOPHAGE-XR) 500 MG 24 hr tablet Take 1,000 mg by mouth 2 (two) times daily.  03/23/16  Yes [provider]  methylphenidate (RITALIN) 20 MG tablet Take 1 tablet (20 mg total) by mouth 2 (two) times daily. 03/04/19  Yes [name], Billey Co., MD  Multiple Vitamin (MULTIVITAMIN) tablet Take 1 tablet by mouth daily.     Yes [provider]  nitroGLYCERIN (NITROSTAT) 0.4 MG SL tablet Place 1 tablet (0.4 mg total) under the tongue every 5 (five) minutes as needed for chest pain. 10/31/18 03/03/21 Yes [name], Reynold Bowen, MD  Omega-3 Fatty Acids (FISH OIL) 500 MG CAPS Take 1 capsule by mouth 2 (two) times daily.    Yes [provider]  omeprazole (PRILOSEC) 20 MG capsule TAKE 1 CAPSULE BY MOUTH TWICE A DAY WITH MEALS Patient taking differently: Take 20 mg by mouth daily.  12/19/18  Yes Tanda Rockers, MD  ONE TOUCH ULTRA TEST test strip CHECK BLOOD SUGAR TWICE DAILY 07/11/18  Yes [name], MD  RESTASIS MULTIDOSE 0.05 % ophthalmic emulsion Place 1 drop into both eyes 2 (two) times daily.  06/21/16  Yes [provider]  rosuvastatin (CRESTOR) 20 MG tablet Take 20 mg by mouth daily.    Yes [provider]  tiZANidine (ZANAFLEX) 2 MG tablet TAKE 1 TABLET BY MOUTH TWICE A DAY AS NEEDED Patient taking differently:  Take 2 mg by mouth at bedtime.  11/18/18  Yes [name], MD  valsartan-hydrochlorothiazide (DIOVAN-HCT) 320-25 MG tablet Take 1 tablet by mouth daily. 05/07/19  Yes [name], [name], MD  warfarin (COUMADIN) 5 MG tablet TAKE AS DIRECTED BY COUMADIN CLINIC. Patient taking differently: 5mg  on Sunday& Thursday, 2.5mg  All other days 07/04/18  Yes [name], MD    Allergies    Anaprox [naproxen sodium], Lipitor [atorvastatin], Morphine,  Meperidine, and Naproxen sodium  Review of Systems   Review of Systems  Constitutional: Negative for appetite change and fatigue.  HENT: Negative for congestion, ear discharge and sinus pressure.   Eyes: Negative for discharge.  Respiratory: Negative for cough.   Cardiovascular: Negative for chest pain.  Gastrointestinal: Negative for abdominal pain and diarrhea.  Genitourinary: Negative for frequency and hematuria.  Musculoskeletal: Negative for back pain.       Left knee and left shoulder pain  Skin: Negative for rash.  Neurological: Negative for seizures and headaches.  Psychiatric/Behavioral: Negative for hallucinations.    Physical Exam Updated Vital Signs BP 131/64   Pulse 77   Temp 98.4 F (36.9 C)   Resp 16   Ht 5\' 4"  (1.626 m)   Wt 61.2 kg   SpO2 99%   BMI 23.17 kg/m   Physical Exam Vitals and nursing note reviewed.  Constitutional:      Appearance: She is well-developed.  HENT:     Head: Normocephalic.     Nose: Nose normal.  Eyes:     General: No scleral icterus.    Conjunctiva/sclera: Conjunctivae normal.  Neck:     Thyroid: No thyromegaly.  Cardiovascular:     Rate and Rhythm: Normal rate and regular rhythm.     Heart sounds: No murmur. No friction rub. No gallop.   Pulmonary:     Breath sounds: No stridor. No wheezing or rales.  Chest:     Chest wall: No tenderness.  Abdominal:     General: There is no distension.     Tenderness: There is no abdominal tenderness. There is no rebound.    Musculoskeletal:        General: Normal range of motion.     Cervical back: Neck supple.     Comments: Moderate tenderness left knee and left shoulder  Lymphadenopathy:     Cervical: No cervical adenopathy.  Skin:    Findings: No erythema or rash.  Neurological:     Mental Status: She is alert and oriented to person, place, and time.     Motor: No abnormal muscle tone.     Coordination: Coordination normal.  Psychiatric:        Behavior: Behavior normal.     ED Results / Procedures / Treatments   Labs (all labs ordered are listed, but only abnormal results are displayed) Labs Reviewed - No data to display  EKG None  Radiology No results found.  Procedures Procedures (including critical care time)  Medications Ordered in ED Medications - No data to display  ED Course  I have reviewed the triage vital signs and the nursing notes.  Pertinent labs & imaging results that were available during my care of the patient were reviewed by me and considered in my medical decision making (see chart for details).    MDM Rules/Calculators/A&P                      Fall with contusion to left knee and left shoulder.  CT head cervical spine negative.  Patient will follow-up with her doctor Final Clinical Impression(s) / ED Diagnoses Final diagnoses:  Fall, initial encounter    Rx / DC Orders ED Discharge Orders    None       Milton Ferguson, MD 06/10/19 1050

## 2019-06-14 ENCOUNTER — Other Ambulatory Visit: Payer: Self-pay | Admitting: Family Medicine

## 2019-06-17 DIAGNOSIS — I10 Essential (primary) hypertension: Secondary | ICD-10-CM | POA: Diagnosis not present

## 2019-06-18 DIAGNOSIS — I10 Essential (primary) hypertension: Secondary | ICD-10-CM | POA: Diagnosis not present

## 2019-06-19 ENCOUNTER — Other Ambulatory Visit: Payer: Self-pay | Admitting: Family Medicine

## 2019-06-23 ENCOUNTER — Other Ambulatory Visit: Payer: Self-pay | Admitting: General Practice

## 2019-06-23 MED ORDER — ACCU-CHEK SOFTCLIX LANCETS MISC: 12 refills | Status: DC

## 2019-06-23 MED ORDER — ACCU-CHEK GUIDE VI STRP: ORAL_STRIP | 12 refills | Status: DC

## 2019-06-23 MED ORDER — ACCU-CHEK GUIDE W/DEVICE KIT: 1.0000 | PACK | Freq: Two times a day (BID) | 1 refills | Status: AC

## 2019-06-24 ENCOUNTER — Other Ambulatory Visit: Payer: Self-pay

## 2019-06-25 ENCOUNTER — Ambulatory Visit (INDEPENDENT_AMBULATORY_CARE_PROVIDER_SITE_OTHER): Payer: Medicare PPO | Admitting: General Practice

## 2019-06-25 DIAGNOSIS — Z7901 Long term (current) use of anticoagulants: Secondary | ICD-10-CM

## 2019-06-25 LAB — POCT INR: INR: 3.1 — AB (ref 2.0–3.0)

## 2019-06-25 NOTE — Progress Notes (Signed)
I have reviewed the results and agree with this plan   

## 2019-06-25 NOTE — Patient Instructions (Incomplete)
Pre visit review using our clinic review tool, if applicable. No additional management support is needed unless otherwise documented below in the visit note. 

## 2019-07-03 ENCOUNTER — Other Ambulatory Visit: Payer: Self-pay | Admitting: Internal Medicine

## 2019-07-03 ENCOUNTER — Telehealth: Payer: Self-pay | Admitting: Psychiatry

## 2019-07-03 ENCOUNTER — Telehealth: Payer: Self-pay | Admitting: Pharmacist

## 2019-07-03 ENCOUNTER — Other Ambulatory Visit: Payer: Self-pay

## 2019-07-03 ENCOUNTER — Other Ambulatory Visit: Payer: Self-pay | Admitting: Family Medicine

## 2019-07-03 DIAGNOSIS — F331 Major depressive disorder, recurrent, moderate: Secondary | ICD-10-CM

## 2019-07-03 DIAGNOSIS — I10 Essential (primary) hypertension: Secondary | ICD-10-CM

## 2019-07-03 DIAGNOSIS — Z1231 Encounter for screening mammogram for malignant neoplasm of breast: Secondary | ICD-10-CM

## 2019-07-03 MED ORDER — METHYLPHENIDATE HCL 20 MG PO TABS: 20.0000 mg | ORAL_TABLET | Freq: Two times a day (BID) | ORAL | 0 refills | Status: DC

## 2019-07-03 NOTE — Telephone Encounter (Signed)
Last 90 day refill was 03/31/2019, pended for Dr. Clovis Pu to submit Patient due back in June 2021

## 2019-07-03 NOTE — Telephone Encounter (Signed)
Andrea Simmons called to request a refill of her Ritalin.  Appt 09/02/19.  Send to [facility] in [place]

## 2019-07-03 NOTE — Telephone Encounter (Signed)
Called patient to review BP readings. BP readings elevated and not at goal. SBP trending from 150-160s, DBP around 80-90s. HR 60s-70s. Reviewed and updated med list together. Pt reports to be tolerating her medications well and denies any ADRs. Reports to be active around the house. Discussed patient with Dr. Virgina Jock. Agreed with increasing amlodipine to 10 mg daily. Relayed the new dose change with the patient. Pt will take 2 tablets of amlodipine 5 mg till she runs out. Pt recently picked up a new refill. Pt reports improvement in lower extremity swelling since last OV and uses lasix PRN. Will continue to monitor and follow up as needed.

## 2019-07-07 ENCOUNTER — Other Ambulatory Visit: Payer: Self-pay | Admitting: Family Medicine

## 2019-07-22 ENCOUNTER — Other Ambulatory Visit: Payer: Self-pay

## 2019-07-23 ENCOUNTER — Ambulatory Visit: Payer: Medicare PPO | Admitting: General Practice

## 2019-07-23 DIAGNOSIS — Z7901 Long term (current) use of anticoagulants: Secondary | ICD-10-CM | POA: Diagnosis not present

## 2019-07-23 DIAGNOSIS — I2699 Other pulmonary embolism without acute cor pulmonale: Secondary | ICD-10-CM

## 2019-07-23 LAB — POCT INR: INR: 3.6 — AB (ref 2.0–3.0)

## 2019-07-23 NOTE — Progress Notes (Signed)
I have reviewed the results and agree with this plan   

## 2019-07-23 NOTE — Patient Instructions (Addendum)
Pre visit review using our clinic review tool, if applicable. No additional management support is needed unless otherwise documented below in the visit note.  Skip coumadin today (5/5) and then change dosage and take  1/2 tablet daily except 1 tablet on Sundays only.  Re-check in 4 weeks.

## 2019-08-05 ENCOUNTER — Other Ambulatory Visit: Payer: Self-pay

## 2019-08-06 ENCOUNTER — Encounter: Payer: Self-pay | Admitting: Family Medicine

## 2019-08-06 ENCOUNTER — Ambulatory Visit: Payer: Medicare PPO | Admitting: Family Medicine

## 2019-08-06 VITALS — BP 140/82 | HR 80 | Temp 97.9°F | Resp 16 | Ht 64.0 in | Wt 131.1 lb

## 2019-08-06 DIAGNOSIS — E785 Hyperlipidemia, unspecified: Secondary | ICD-10-CM | POA: Diagnosis not present

## 2019-08-06 DIAGNOSIS — E119 Type 2 diabetes mellitus without complications: Secondary | ICD-10-CM

## 2019-08-06 DIAGNOSIS — E039 Hypothyroidism, unspecified: Secondary | ICD-10-CM

## 2019-08-06 DIAGNOSIS — I2699 Other pulmonary embolism without acute cor pulmonale: Secondary | ICD-10-CM

## 2019-08-06 DIAGNOSIS — I10 Essential (primary) hypertension: Secondary | ICD-10-CM | POA: Diagnosis not present

## 2019-08-06 NOTE — Assessment & Plan Note (Signed)
Remains on Coumadin.  Will be lifelong.

## 2019-08-06 NOTE — Assessment & Plan Note (Signed)
Chronic problem.  Tolerating statin w/o difficulty.  Check labs.  Adjust meds prn  

## 2019-08-06 NOTE — Assessment & Plan Note (Signed)
Chronic problem.  Hx of good control.  UTD on foot exam.  On ARB for renal protection.  Due for eye exam- scheduled for June.  Check labs.  Adjust meds prn

## 2019-08-06 NOTE — Assessment & Plan Note (Signed)
BP is mildly elevated today but is better than previous.  She is also on Prednisone and we discussed this can raise her BP.  Currently asymptomatic.  Check labs.  No anticipated med changes.  Will follow.

## 2019-08-06 NOTE — Assessment & Plan Note (Signed)
Chronic problem.  Currently asymptomatic.  Check labs.  Adjust meds prn  

## 2019-08-06 NOTE — Patient Instructions (Addendum)
Follow up in 3-4 months to recheck diabetes We'll notify you of your lab results and make any changes if needed Continue to work on healthy diet and regular exercise- you look great! Have them send me a copy of your eye exam Call with any questions or concerns Have a great summer!!!

## 2019-08-06 NOTE — Progress Notes (Signed)
   Subjective:    Patient ID: Andrea Simmons, female    DOB: 11-28-1940, 79 y.o.   MRN: [id number]  HPI HTN- chronic problem, on Amlodipine 10mg  daily, Lasix 20mg , Valsartan HCTZ 320/25mg  daily.  BP is mildly elevated but this is consistent w/ previous readings.  Cards just increased Amlodipine to 10mg  daily.  Denies recent CP, SOB, HAs, visual changes, edema.  DM- chronic problem, Metformin XR.  On ARB for renal protection.  UTD on foot exam, has eye exam scheduled.  Denies symptomatic lows.  Sugars have been elevated recently due to prednisone.  Hyperlipidemia- chronic problem, on Crestor 20mg  daily and Fenofibrate 135mg  daily.  Denies abd pain, N/V.  Hypothyroid- chronic problem, on Levothyroxine 55mcg daily.  Denies excessive fatigue, changes to skin/hair/nails.   Review of Systems For ROS see HPI   This visit occurred during the SARS-CoV-2 public health emergency.  Safety protocols were in place, including screening questions prior to the visit, additional usage of staff PPE, and extensive cleaning of exam room while observing appropriate contact time as indicated for disinfecting solutions.       Objective:   Physical Exam Vitals reviewed.  Constitutional:      General: She is not in acute distress.    Appearance: Normal appearance. She is well-developed.  HENT:     Head: Normocephalic and atraumatic.  Eyes:     Conjunctiva/sclera: Conjunctivae normal.     Pupils: Pupils are equal, round, and reactive to light.  Neck:     Thyroid: No thyromegaly.  Cardiovascular:     Rate and Rhythm: Normal rate and regular rhythm.     Heart sounds: Normal heart sounds. No murmur.  Pulmonary:     Effort: Pulmonary effort is normal. No respiratory distress.     Breath sounds: Normal breath sounds.  Abdominal:     General: There is no distension.     Palpations: Abdomen is soft.     Tenderness: There is no abdominal tenderness.  Musculoskeletal:     Cervical back: Normal range of  motion and neck supple.  Lymphadenopathy:     Cervical: No cervical adenopathy.  Skin:    General: Skin is warm and dry.  Neurological:     Mental Status: She is alert and oriented to person, place, and time.  Psychiatric:        Behavior: Behavior normal.           Assessment & Plan:

## 2019-08-07 ENCOUNTER — Other Ambulatory Visit: Payer: Self-pay | Admitting: General Practice

## 2019-08-07 DIAGNOSIS — R7989 Other specified abnormal findings of blood chemistry: Secondary | ICD-10-CM

## 2019-08-07 DIAGNOSIS — E039 Hypothyroidism, unspecified: Secondary | ICD-10-CM

## 2019-08-07 LAB — LIPID PANEL
Cholesterol: 113 mg/dL (ref 0–200)
HDL: 41.8 mg/dL (ref >39.00)
LDL Cholesterol: 49 mg/dL (ref 0–99)
NonHDL: 70.89
Total CHOL/HDL Ratio: 3
Triglycerides: 110 mg/dL (ref 0.0–149.0)
VLDL: 22 mg/dL (ref 0.0–40.0)

## 2019-08-07 LAB — HEPATIC FUNCTION PANEL
ALT: 43 U/L — ABNORMAL HIGH (ref 0–35)
AST: 37 U/L (ref 0–37)
Albumin: 4.5 g/dL (ref 3.5–5.2)
Alkaline Phosphatase: 44 U/L (ref 39–117)
Bilirubin, Direct: 0.2 mg/dL (ref 0.0–0.3)
Total Bilirubin: 0.5 mg/dL (ref 0.2–1.2)
Total Protein: 6.7 g/dL (ref 6.0–8.3)

## 2019-08-07 LAB — BASIC METABOLIC PANEL
BUN: 29 mg/dL — ABNORMAL HIGH (ref 6–23)
CO2: 26 mEq/L (ref 19–32)
Calcium: 10.5 mg/dL (ref 8.4–10.5)
Chloride: 103 mEq/L (ref 96–112)
Creatinine, Ser: 1.02 mg/dL (ref 0.40–1.20)
GFR: 52.51 mL/min — ABNORMAL LOW (ref >60.00)
Glucose, Bld: 120 mg/dL — ABNORMAL HIGH (ref 70–99)
Potassium: 4.5 mEq/L (ref 3.5–5.1)
Sodium: 137 mEq/L (ref 135–145)

## 2019-08-07 LAB — TSH: TSH: 0.31 u[IU]/mL — ABNORMAL LOW (ref 0.35–4.50)

## 2019-08-07 LAB — CBC WITH DIFFERENTIAL/PLATELET
Basophils Absolute: 0 10*3/uL (ref 0.0–0.1)
Basophils Relative: 0.6 % (ref 0.0–3.0)
Eosinophils Absolute: 0 10*3/uL (ref 0.0–0.7)
Eosinophils Relative: 0.2 % (ref 0.0–5.0)
HCT: 37.8 % (ref 36.0–46.0)
Hemoglobin: 12.7 g/dL (ref 12.0–15.0)
Lymphocytes Relative: 16 % (ref 12.0–46.0)
Lymphs Abs: 1.4 10*3/uL (ref 0.7–4.0)
MCHC: 33.6 g/dL (ref 30.0–36.0)
MCV: 93.6 fl (ref 78.0–100.0)
Monocytes Absolute: 0.7 10*3/uL (ref 0.1–1.0)
Monocytes Relative: 8.2 % (ref 3.0–12.0)
Neutro Abs: 6.7 10*3/uL (ref 1.4–7.7)
Neutrophils Relative %: 75 % (ref 43.0–77.0)
Platelets: 257 10*3/uL (ref 150.0–400.0)
RBC: 4.04 Mil/uL (ref 3.87–5.11)
RDW: 13.8 % (ref 11.5–15.5)
WBC: 8.9 10*3/uL (ref 4.0–10.5)

## 2019-08-07 LAB — HEMOGLOBIN A1C: Hgb A1c MFr Bld: 6.2 % (ref 4.6–6.5)

## 2019-08-07 MED ORDER — LEVOTHYROXINE SODIUM 25 MCG PO TABS: 25.0000 ug | ORAL_TABLET | Freq: Every day | ORAL | 3 refills | Status: DC

## 2019-08-12 ENCOUNTER — Other Ambulatory Visit: Payer: Self-pay | Admitting: Pharmacist

## 2019-08-12 DIAGNOSIS — I10 Essential (primary) hypertension: Secondary | ICD-10-CM

## 2019-08-12 MED ORDER — AMLODIPINE BESYLATE 10 MG PO TABS: 10.0000 mg | ORAL_TABLET | Freq: Every day | ORAL | 2 refills | Status: DC

## 2019-08-20 ENCOUNTER — Ambulatory Visit (INDEPENDENT_AMBULATORY_CARE_PROVIDER_SITE_OTHER): Payer: Medicare PPO | Admitting: General Practice

## 2019-08-20 ENCOUNTER — Other Ambulatory Visit: Payer: Self-pay | Admitting: Cardiology

## 2019-08-20 ENCOUNTER — Other Ambulatory Visit: Payer: Self-pay | Admitting: Family Medicine

## 2019-08-20 ENCOUNTER — Other Ambulatory Visit: Payer: Self-pay

## 2019-08-20 DIAGNOSIS — I10 Essential (primary) hypertension: Secondary | ICD-10-CM

## 2019-08-20 DIAGNOSIS — Z7901 Long term (current) use of anticoagulants: Secondary | ICD-10-CM

## 2019-08-20 DIAGNOSIS — I2699 Other pulmonary embolism without acute cor pulmonale: Secondary | ICD-10-CM

## 2019-08-20 LAB — POCT INR: INR: 2.9 (ref 2.0–3.0)

## 2019-08-20 NOTE — Patient Instructions (Signed)
Pre visit review using our clinic review tool, if applicable. No additional management support is needed unless otherwise documented below in the visit note.  Continue to take  1/2 tablet daily except 1 tablet on Sundays only.  Re-check in 4 weeks.

## 2019-08-20 NOTE — Progress Notes (Signed)
I have reviewed the results and agree with this plan   

## 2019-08-27 ENCOUNTER — Other Ambulatory Visit: Payer: Self-pay | Admitting: Cardiology

## 2019-08-27 DIAGNOSIS — R600 Localized edema: Secondary | ICD-10-CM

## 2019-09-02 ENCOUNTER — Ambulatory Visit: Payer: Medicare Other | Admitting: Psychiatry

## 2019-09-05 ENCOUNTER — Ambulatory Visit: Payer: Medicare PPO | Admitting: Cardiology

## 2019-09-05 ENCOUNTER — Other Ambulatory Visit: Payer: Self-pay

## 2019-09-05 ENCOUNTER — Encounter: Payer: Self-pay | Admitting: Cardiology

## 2019-09-05 VITALS — BP 126/80 | HR 53 | Ht 64.0 in | Wt 135.0 lb

## 2019-09-05 DIAGNOSIS — I10 Essential (primary) hypertension: Secondary | ICD-10-CM

## 2019-09-05 DIAGNOSIS — R0609 Other forms of dyspnea: Secondary | ICD-10-CM

## 2019-09-05 DIAGNOSIS — R600 Localized edema: Secondary | ICD-10-CM

## 2019-09-05 NOTE — Progress Notes (Signed)
Follow up visit  Subjective:   Andrea Simmons, female    DOB: 01-Mar-1941, 79 y.o.   MRN: [id number]   Chief Complaint  Patient presents with  . Hypertension  . 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 BP readings have begun to improve to optimal level. She continues to have leg swelling towards end of the day, associated with dull pain.   Current Outpatient Medications on File Prior to Visit  Medication Sig Dispense Refill  . Accu-Chek Softclix Lancets lancets Use as instructed to test sugars BID 100 each 12  . amLODipine (NORVASC) 10 MG tablet Take 1 tablet (10 mg total) by mouth daily. 90 tablet 2  . BIOTIN PO Take by mouth.    . Blood Glucose Monitoring Suppl (ACCU-CHEK GUIDE) w/Device KIT 1 each by Does not apply route 2 (two) times daily. To test sugars. Dx. E11.9 1 kit 1  . calcium carbonate (CALCIUM 600) 1500 (600 Ca) MG TABS tablet Take 600 mg of elemental calcium by mouth 2 (two) times daily with a meal.    . Cholecalciferol 1.25 MG (50000 UT) TABS Take 1 tablet by mouth once a week. 10 tablet 3  . Choline Fenofibrate 135 MG capsule Take 135 mg by mouth daily.      . DULoxetine (CYMBALTA) 60 MG capsule TAKE 1 CAPSULE BY MOUTH TWICE A DAY 180 capsule 1  . Ferrous Sulfate (IRON) 325 (65 Fe) MG TABS Take by mouth.    . furosemide (LASIX) 20 MG tablet TAKE 1 TABLET BY MOUTH AS NEEDED (Patient taking differently: Take 20 mg by mouth daily. Only takes as needed) 90 tablet 1  . glucose blood (ACCU-CHEK GUIDE) test strip CHECK BLOOD SUGARS DAILY 100 strip 12  . levothyroxine (SYNTHROID) 25 MCG tablet Take 1 tablet (25 mcg total) by mouth daily before breakfast. 30 tablet 3  . Magnesium 500 MG TABS Take by mouth.    . metFORMIN (GLUCOPHAGE-XR) 500 MG 24 hr tablet Take 1,000 mg by  mouth 2 (two) times daily.   3  . methylphenidate (RITALIN) 20 MG tablet Take 1 tablet (20 mg total) by mouth 2 (two) times daily. 180 tablet 0  . Multiple Vitamin (MULTIVITAMIN) tablet Take 1 tablet by mouth daily.      . nitroGLYCERIN (NITROSTAT) 0.4 MG SL tablet Place 1 tablet (0.4 mg total) under the tongue every 5 (five) minutes as needed for chest pain. 30 tablet 3  . Omega-3 Fatty Acids (FISH OIL) 500 MG CAPS Take 1 capsule by mouth 2 (two) times daily.     Marland Kitchen omeprazole (PRILOSEC) 20 MG capsule TAKE 1 CAPSULE BY MOUTH TWICE A DAY WITH MEALS 180 capsule 0  . RESTASIS MULTIDOSE 0.05 % ophthalmic emulsion Place 1 drop into both eyes 2 (two) times daily.   6  . rosuvastatin (CRESTOR) 20 MG tablet Take 20 mg by mouth daily.     Marland Kitchen tiZANidine (ZANAFLEX) 2 MG tablet TAKE 1 TABLET BY MOUTH TWICE A DAY AS NEEDED (Patient taking differently: Take 2 mg by mouth at bedtime. ) 180 tablet 0  . valsartan-hydrochlorothiazide (DIOVAN-HCT) 320-25 MG tablet Take 1 tablet by mouth in the morning. 90 tablet 2  . warfarin (COUMADIN) 5 MG tablet 46m on Sunday& Thursday, 2.558mAll other days (Patient taking differently: 5m17mn Sunday, 2.5mg54ml other  days) 90 tablet 1   Current Facility-Administered Medications on File Prior to Visit  Medication Dose Route Frequency Provider Last Rate Last Admin  . methylPREDNISolone acetate (DEPO-MEDROL) injection 40 mg  40 mg Other Once Magnus Sinning, MD        Cardiovascular studies:  EKG 09/05/2019: Sinus rhythm 81 bpm  Right bundle branch block Left anterior fascicular block  Lexiscan myoview stress test 01/30/2018: 1. The resting electrocardiogram demonstrated normal sinus rhythm, LAD, LAFB, RBBB and no resting arrhythmias.  The stress electrocardiogram was non-diagnostic due to pharmacologic stress. Stress symptoms included dyspnea. Resting BP 166/86 and peak BP 202/88 mm Hg.  2. Myocardial perfusion imaging is normal. Overall left ventricular systolic function was  normal without regional wall motion abnormalities. The left ventricular ejection fraction was 56%.  This is a low risk study.  Echocardiogram 01/28/2018:  Left ventricle cavity is normal in size. Moderate concentric hypertrophy of the left ventricle. Normal global wall motion. Doppler evidence of grade I (impaired) diastolic dysfunction, normal LAP. Calculated EF 55%. Left atrial cavity is mildly dilated. Mild (Grade I) aortic regurgitation. Mild (Grade I) mitral regrgitation. Trace tricuspid regurgitation. Inadequate tricuspid regurgitation jet to estimate pulmonary artery pressure. Normal right atrial pressure.  Recent labs: 05/28/2019: Glucose 119, BUN/Cr 17/0.85. EGFR 66. Na/K 140/4.5.   10-02/2019: Glucose 96, BUN/Cr 20/0.74. EGFR 79. Na/K 139/4.4. Rest of the CMP normal H/H 12/36. MCV 91. Platelets 247 Chol 133, TG 119, HDL 38, LDL 71 TSH 0.9 normal   10/17/2018: Glucose 98, BUN/Cr 20/0.8. EGFR >60. Na/K 140/4.2. Rest of the CMP normal   Review of Systems  Cardiovascular: Negative for chest pain, dyspnea on exertion, leg swelling, palpitations and syncope.        Vitals:   09/05/19 1540  BP: 126/80  Pulse: (!) 53  SpO2: 95%      Objective:   Physical Exam Vitals and nursing note reviewed.  Constitutional:      General: She is not in acute distress. Neck:     Vascular: No JVD.  Cardiovascular:     Rate and Rhythm: Normal rate and regular rhythm.     Pulses: Intact distal pulses.     Heart sounds: Normal heart sounds. No murmur heard.      Comments: Bilateral LE varcocities Pulmonary:     Effort: Pulmonary effort is normal.     Breath sounds: Normal breath sounds. No wheezing or rales.         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 Conitnue  valsartan-HCTZ 320-25 mg to 1 tab daily, amlodipine 5 mg daily.  Continue pharmacist guided remote patient monitoring.  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 Recommend compression stockings.  F/u in 3 months.   Nigel Mormon, MD [facility] Pager: [phone] Office: [phone] If no answer Cell [phone]

## 2019-09-08 ENCOUNTER — Ambulatory Visit
Admission: RE | Admit: 2019-09-08 | Discharge: 2019-09-08 | Disposition: A | Discharge status: Home / Self Care | Payer: Medicare PPO | Source: Ambulatory Visit | Attending: Family Medicine | Admitting: Family Medicine

## 2019-09-08 ENCOUNTER — Other Ambulatory Visit: Payer: Self-pay

## 2019-09-08 ENCOUNTER — Other Ambulatory Visit: Payer: Self-pay | Admitting: Family Medicine

## 2019-09-08 DIAGNOSIS — Z1231 Encounter for screening mammogram for malignant neoplasm of breast: Secondary | ICD-10-CM

## 2019-09-10 ENCOUNTER — Ambulatory Visit: Payer: Medicare PPO

## 2019-09-10 ENCOUNTER — Ambulatory Visit: Payer: Medicare PPO | Admitting: Physician Assistant

## 2019-09-10 ENCOUNTER — Other Ambulatory Visit: Payer: Self-pay

## 2019-09-10 ENCOUNTER — Encounter: Payer: Self-pay | Admitting: Physician Assistant

## 2019-09-10 VITALS — BP 124/76 | HR 90 | Temp 97.3°F | Resp 18 | Ht 64.0 in | Wt 133.8 lb

## 2019-09-10 DIAGNOSIS — R945 Abnormal results of liver function studies: Secondary | ICD-10-CM | POA: Diagnosis not present

## 2019-09-10 DIAGNOSIS — R7989 Other specified abnormal findings of blood chemistry: Secondary | ICD-10-CM

## 2019-09-10 DIAGNOSIS — M79632 Pain in left forearm: Secondary | ICD-10-CM

## 2019-09-10 DIAGNOSIS — E039 Hypothyroidism, unspecified: Secondary | ICD-10-CM | POA: Diagnosis not present

## 2019-09-10 DIAGNOSIS — G5602 Carpal tunnel syndrome, left upper limb: Secondary | ICD-10-CM

## 2019-09-10 DIAGNOSIS — M79631 Pain in right forearm: Secondary | ICD-10-CM

## 2019-09-10 LAB — HEPATIC FUNCTION PANEL
ALT: 25 U/L (ref 0–35)
AST: 40 U/L — ABNORMAL HIGH (ref 0–37)
Albumin: 4.4 g/dL (ref 3.5–5.2)
Alkaline Phosphatase: 43 U/L (ref 39–117)
Bilirubin, Direct: 0.1 mg/dL (ref 0.0–0.3)
Total Bilirubin: 0.6 mg/dL (ref 0.2–1.2)
Total Protein: 6.6 g/dL (ref 6.0–8.3)

## 2019-09-10 LAB — TSH: TSH: 1.9 u[IU]/mL (ref 0.35–4.50)

## 2019-09-10 MED ORDER — PREDNISONE 10 MG PO TABS: ORAL_TABLET | ORAL | 0 refills | Status: AC

## 2019-09-10 NOTE — Patient Instructions (Signed)
Please restart using your wrist brace at home.  If you cannot find it, call me so I can give you one or you can get at the pharmacy.  I want you to wear this at night and when possible during the day over the next week or so.  You need to take it easy -- no heavy lifting, repetitive motions of the hand and wrists.   Elevate arms while resting.  Take the steroid taper as directed. Can use tylenol for breakthrough pain.  Follow-up with [place] in 7-10 days for reassessment.   Hang in there!

## 2019-09-10 NOTE — Progress Notes (Signed)
Patient presents to clinic today c/o 1 week of bilateral forearm pain with pain of left wrist. Denies any trauma or injury. Does note she has been doing a lot around the house and may have over worked her muscles. Patient with history of fibromyalgia and chronic arthritis, followed by rheumatology. If left wrist pain she has noted some occasional tingling in the first few fingers of that hand. Notes remote history of carpal tunnel decades ago. Has had no recent issue with this. Denies any weakness of proximal or distal upper extremities bilaterally. Denies decreased grip strength. Denies any noted numbness.  Past Medical History:  Diagnosis Date  . Anemia   . Anxiety   . Breast cancer ([place])   . Depression   . Diabetes mellitus type II   . Fibromyalgia   . GERD (gastroesophageal reflux disease)   . Hyperlipidemia   . Hypertension   . Malignant neoplasm of breast (female), unspecified site 1993   L breast s/p mastectomy and tamoxifen x 27yr  . 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 Unspecified hypothyroidism     Current Outpatient Medications on File Prior to Visit  Medication Sig Dispense Refill  . Accu-Chek Softclix Lancets lancets Use as instructed to test sugars BID 100 each 12  . amLODipine (NORVASC) 10 MG tablet Take 1 tablet (10 mg total) by mouth daily. 90 tablet 2  . BIOTIN PO Take by mouth.    . Blood Glucose Monitoring Suppl (ACCU-CHEK GUIDE) w/Device KIT 1 each by Does not apply route 2 (two) times daily. To test sugars. Dx. E11.9 1 kit 1  . calcium carbonate (CALCIUM 600) 1500 (600 Ca) MG TABS tablet Take 600 mg of elemental calcium by mouth 2 (two) times daily with a meal.    . Cholecalciferol 1.25 MG (50000 UT) TABS Take 1 tablet by mouth once a week. 10 tablet 3  . Choline Fenofibrate 135 MG capsule Take 135 mg by mouth daily.      . DULoxetine (CYMBALTA) 60 MG capsule TAKE 1 CAPSULE BY MOUTH TWICE A DAY 180 capsule 1  .  Ferrous Sulfate (IRON) 325 (65 Fe) MG TABS Take by mouth.    . furosemide (LASIX) 20 MG tablet TAKE 1 TABLET BY MOUTH AS NEEDED (Patient taking differently: Take 20 mg by mouth daily. Only takes as needed) 90 tablet 1  . glucose blood (ACCU-CHEK GUIDE) test strip CHECK BLOOD SUGARS DAILY 100 strip 12  . levothyroxine (SYNTHROID) 25 MCG tablet Take 1 tablet (25 mcg total) by mouth daily before breakfast. 30 tablet 3  . Magnesium 500 MG TABS Take by mouth.    . metFORMIN (GLUCOPHAGE-XR) 500 MG 24 hr tablet Take 1,000 mg by mouth 2 (two) times daily.   3  . methylphenidate (RITALIN) 20 MG tablet Take 1 tablet (20 mg total) by mouth 2 (two) times daily. 180 tablet 0  . Multiple Vitamin (MULTIVITAMIN) tablet Take 1 tablet by mouth daily.      . nitroGLYCERIN (NITROSTAT) 0.4 MG SL tablet Place 1 tablet (0.4 mg total) under the tongue every 5 (five) minutes as needed for chest pain. 30 tablet 3  . Omega-3 Fatty Acids (FISH OIL) 500 MG CAPS Take 1 capsule by mouth 2 (two) times daily.     .Marland Kitchenomeprazole (PRILOSEC) 20 MG capsule TAKE 1 CAPSULE BY MOUTH TWICE A DAY WITH MEALS (Patient taking differently: Take 20 mg by mouth at bedtime. ) 180  capsule 0  . RESTASIS MULTIDOSE 0.05 % ophthalmic emulsion Place 1 drop into both eyes 2 (two) times daily.   6  . rosuvastatin (CRESTOR) 20 MG tablet Take 20 mg by mouth daily.     Marland Kitchen tiZANidine (ZANAFLEX) 2 MG tablet TAKE 1 TABLET BY MOUTH TWICE A DAY AS NEEDED (Patient taking differently: Take 2 mg by mouth at bedtime. ) 180 tablet 0  . valsartan-hydrochlorothiazide (DIOVAN-HCT) 320-25 MG tablet Take 1 tablet by mouth in the morning. 90 tablet 2  . warfarin (COUMADIN) 5 MG tablet 30m on Sunday& Thursday, 2.542mAll other days (Patient taking differently: 82m11mn Sunday, 2.82mg102ml other days) 90 tablet 1   Current Facility-Administered Medications on File Prior to Visit  Medication Dose Route Frequency Provider Last Rate Last Admin  . methylPREDNISolone acetate  (DEPO-MEDROL) injection 40 mg  40 mg Other Once NewtMagnus Sinning        Allergies  Allergen Reactions  . Anaprox [Naproxen Sodium]   . Lipitor [Atorvastatin]   . Morphine   . Meperidine Nausea And Vomiting  . Naproxen Sodium Nausea And Vomiting    Family History  Problem Relation Age of Onset  . Depression Other        Parent  . Arthritis Other        Parent, Grandparent  . Hypertension Other        Grandparent  . Hyperlipidemia Other        [facility] / Still[facility] Other        Grandparent  . Stroke Other        [facility] Maternal Uncle        prostate  . Hypertension Maternal Grandfather   . Breast cancer Cousin   . Breast cancer Cousin     Social History   Socioeconomic History  . Marital status: Married    Spouse name: Not on file  . Number of children: 1  . Years of education: Not on file  . Highest education level: Not on file  Occupational History  . Occupation: teacArmed forces technical officerTIRED  . 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 Smoking status: Never Smoker  . Smokeless tobacco: Never Used  Vaping Use  . Vaping Use: Never used  Substance and Sexual Activity  . Alcohol use: No    Alcohol/week: 0.0 standard drinks  . Drug use: No  . Sexual activity: Not on file  Other Topics Concern  . Not on file  Social History Narrative  . Not on file   Social Determinants of Health   Financial Resource Strain:   . Difficulty of Paying Living Expenses:   Food Insecurity:   . Worried About RunnCharity fundraiserthe Last Year:   . Ran Arboriculturistthe Last Year:   Transportation Needs:   . LackFilm/video editordical):   . LaMarland Kitchenk of Transportation (Non-Medical):   Physical Activity:   . Days of Exercise per Week:   . Minutes of Exercise per Session:   Stress:   . Feeling of Stress :   Social Connections:   . Frequency of Communication with Friends and Family:   . Frequency of  Social Gatherings with Friends and Family:   . Attends Religious Services:   . Active Member of Clubs or Organizations:   . Attends ClubArchivisttings:   . MaMarland Kitchenital Status:  Review of Systems - See HPI.  All other ROS are negative.  BP 124/76   Pulse 90   Temp (!) 97.3 F (36.3 C) (Temporal)   Resp 18   Ht '5\' 4"'  (1.626 m)   Wt 133 lb 12.8 oz (60.7 kg)   SpO2 95%   BMI 22.97 kg/m   Physical Exam Vitals reviewed.  Constitutional:      Appearance: Normal appearance.  HENT:     Head: Normocephalic and atraumatic.  Cardiovascular:     Rate and Rhythm: Normal rate and regular rhythm.     Heart sounds: Normal heart sounds.  Musculoskeletal:     Right forearm: Tenderness present. No swelling, edema, deformity or bony tenderness.     Left forearm: Tenderness present. No swelling, edema, deformity or bony tenderness.     Right wrist: Normal.     Left wrist: Tenderness present. No swelling, deformity or bony tenderness. Normal range of motion.     Cervical back: Neck supple.     Comments: + Tinel sign of L hand. + Phalen sign of L hand. Negative testing on R.   Neurological:     General: No focal deficit present.     Mental Status: She is alert and oriented to person, place, and time.     Recent Results (from the past 2160 hour(s))  POCT INR     Status: Abnormal   Collection Time: 06/25/19 12:00 AM  Result Value Ref Range   INR 3.1 (A) 2.0 - 3.0  POCT INR     Status: Abnormal   Collection Time: 07/23/19 12:00 AM  Result Value Ref Range   INR 3.6 (A) 2.0 - 3.0  Hemoglobin A1c     Status: None   Collection Time: 08/06/19  1:56 PM  Result Value Ref Range   Hgb A1c MFr Bld 6.2 4.6 - 6.5 %    Comment: Glycemic Control Guidelines for People with Diabetes:Non Diabetic:  <6%Goal of Therapy: <7%Additional Action Suggested:  >8%   Lipid panel     Status: None   Collection Time: 08/06/19  1:56 PM  Result Value Ref Range   Cholesterol 113 0 - 200 mg/dL    Comment: ATP  III Classification       Desirable:  < 200 mg/dL               Borderline High:  200 - 239 mg/dL          High:  > = 240 mg/dL   Triglycerides 110.0 0 - 149 mg/dL    Comment: Normal:  <150 mg/dLBorderline High:  150 - 199 mg/dL   HDL 41.80 >39.00 mg/dL   VLDL 22.0 0.0 - 40.0 mg/dL   LDL Cholesterol 49 0 - 99 mg/dL   Total CHOL/HDL Ratio 3     Comment:                Men          Women1/2 Average Risk     3.4          3.3Average Risk          5.0          4.42X Average Risk          9.6          7.13X Average Risk          15.0          11.0  NonHDL 70.89     Comment: NOTE:  Non-HDL goal should be 30 mg/dL higher than patient's LDL goal (i.e. LDL goal of < 70 mg/dL, would have non-HDL goal of < 100 mg/dL)  Basic metabolic panel     Status: Abnormal   Collection Time: 08/06/19  1:56 PM  Result Value Ref Range   Sodium 137 135 - 145 mEq/L   Potassium 4.5 3.5 - 5.1 mEq/L   Chloride 103 96 - 112 mEq/L   CO2 26 19 - 32 mEq/L   Glucose, Bld 120 (H) 70 - 99 mg/dL   BUN 29 (H) 6 - 23 mg/dL   Creatinine, Ser 1.02 0.40 - 1.20 mg/dL   GFR 52.51 (L) >60.00 mL/min   Calcium 10.5 8.4 - 10.5 mg/dL  TSH     Status: Abnormal   Collection Time: 08/06/19  1:56 PM  Result Value Ref Range   TSH 0.31 (L) 0.35 - 4.50 uIU/mL  Hepatic function panel     Status: Abnormal   Collection Time: 08/06/19  1:56 PM  Result Value Ref Range   Total Bilirubin 0.5 0.2 - 1.2 mg/dL   Bilirubin, Direct 0.2 0.0 - 0.3 mg/dL   Alkaline Phosphatase 44 39 - 117 U/L   AST 37 0 - 37 U/L   ALT 43 (H) 0 - 35 U/L   Total Protein 6.7 6.0 - 8.3 g/dL   Albumin 4.5 3.5 - 5.2 g/dL  CBC with Differential/Platelet     Status: None   Collection Time: 08/06/19  1:56 PM  Result Value Ref Range   WBC 8.9 4.0 - 10.5 K/uL   RBC 4.04 3.87 - 5.11 Mil/uL   Hemoglobin 12.7 12.0 - 15.0 g/dL   HCT 37.8 36 - 46 %   MCV 93.6 78.0 - 100.0 fl   MCHC 33.6 30.0 - 36.0 g/dL   RDW 13.8 11.5 - 15.5 %   Platelets 257.0 Repeated  and verified X2. 150 - 400 K/uL    Comment: Giant Platelets seen on smear.   Neutrophils Relative % 75.0 43 - 77 %   Lymphocytes Relative 16.0 12 - 46 %   Monocytes Relative 8.2 3 - 12 %   Eosinophils Relative 0.2 0 - 5 %   Basophils Relative 0.6 0 - 3 %   Neutro Abs 6.7 1.4 - 7.7 K/uL   Lymphs Abs 1.4 0.7 - 4.0 K/uL   Monocytes Absolute 0.7 0 - 1 K/uL   Eosinophils Absolute 0.0 0 - 0 K/uL   Basophils Absolute 0.0 0 - 0 K/uL  POCT INR     Status: None   Collection Time: 08/20/19 12:00 AM  Result Value Ref Range   INR 2.9 2.0 - 3.0    Assessment/Plan: 1. Carpal tunnel syndrome on left Restart wrist brace nightly and throughout day when possible. Rx prednisone taper since she has to avoid NSAIDs (coumadin therapy) and tylenol (in process of workup for elevated LFTs). If not improving needs to follow-up with her specialists.  - predniSONE (DELTASONE) 10 MG tablet; Take 4 tablets (40 mg total) by mouth daily with breakfast for 2 days, THEN 3 tablets (30 mg total) daily with breakfast for 2 days, THEN 2 tablets (20 mg total) daily with breakfast for 2 days, THEN 1 tablet (10 mg total) daily with breakfast for 2 days.  Dispense: 20 tablet; Refill: 0  2. Pain in both forearms Suspect overuse injury. ROM within normal limits. Strength 5/5 bilaterally.  Cannot use NSAIDs as patient  is on Coumadin therapy.  Rest and elevation recommended.  Avoiding Tylenol use currently due to recent elevation in liver enzymes which is being rechecked today by her PCP at lab visit.  We will start steroid taper to calm symptoms down.  Follow-up if not resolving. - predniSONE (DELTASONE) 10 MG tablet; Take 4 tablets (40 mg total) by mouth daily with breakfast for 2 days, THEN 3 tablets (30 mg total) daily with breakfast for 2 days, THEN 2 tablets (20 mg total) daily with breakfast for 2 days, THEN 1 tablet (10 mg total) daily with breakfast for 2 days.  Dispense: 20 tablet; Refill: 0  3. Hypothyroidism, unspecified  type Due for repeat TSH. Orders in system from PCP. Will have drawn today. - TSH  4. Abnormal LFTs - Hepatic function panel    Leeanne Rio, PA-C

## 2019-09-11 ENCOUNTER — Encounter: Payer: Self-pay | Admitting: General Practice

## 2019-09-17 ENCOUNTER — Ambulatory Visit: Payer: Medicare PPO | Admitting: General Practice

## 2019-09-17 ENCOUNTER — Other Ambulatory Visit: Payer: Self-pay

## 2019-09-17 DIAGNOSIS — I2699 Other pulmonary embolism without acute cor pulmonale: Secondary | ICD-10-CM | POA: Diagnosis not present

## 2019-09-17 DIAGNOSIS — Z7901 Long term (current) use of anticoagulants: Secondary | ICD-10-CM

## 2019-09-17 LAB — POCT INR: INR: 1.4 — AB (ref 2.0–3.0)

## 2019-09-17 NOTE — Patient Instructions (Addendum)
Pre visit review using our clinic review tool, if applicable. No additional management support is needed unless otherwise documented below in the visit note.  Take 1 tablet today, tomorrow and Friday.  On Saturday continue to take  1/2 tablet daily except 1 tablet on Sundays only.  Re-check in 7 to 10 days.  Please stop drinking green tea.

## 2019-09-17 NOTE — Progress Notes (Signed)
I have reviewed the results and agree with this plan   

## 2019-09-19 ENCOUNTER — Ambulatory Visit: Payer: Medicare PPO | Admitting: Physician Assistant

## 2019-09-24 ENCOUNTER — Other Ambulatory Visit: Payer: Self-pay

## 2019-09-24 ENCOUNTER — Ambulatory Visit: Payer: Medicare PPO | Admitting: General Practice

## 2019-09-24 ENCOUNTER — Telehealth: Payer: Self-pay | Admitting: Pharmacist

## 2019-09-24 DIAGNOSIS — I2699 Other pulmonary embolism without acute cor pulmonale: Secondary | ICD-10-CM

## 2019-09-24 DIAGNOSIS — Z7901 Long term (current) use of anticoagulants: Secondary | ICD-10-CM

## 2019-09-24 LAB — POCT INR: INR: 3.8 — AB (ref 2.0–3.0)

## 2019-09-24 NOTE — Patient Instructions (Addendum)
Pre visit review using our clinic review tool, if applicable. No additional management support is needed unless otherwise documented below in the visit note.  Skip dosage today (7/7) and then take 1/2 tablet daily except 1 tablet on Sundays.  Re-check in 3 weeks.

## 2019-09-24 NOTE — Telephone Encounter (Signed)
Med list reviewed and reconciled.

## 2019-09-28 ENCOUNTER — Other Ambulatory Visit: Payer: Self-pay | Admitting: Family Medicine

## 2019-10-01 ENCOUNTER — Other Ambulatory Visit: Payer: Self-pay

## 2019-10-01 ENCOUNTER — Encounter: Payer: Self-pay | Admitting: Family Medicine

## 2019-10-01 ENCOUNTER — Ambulatory Visit (INDEPENDENT_AMBULATORY_CARE_PROVIDER_SITE_OTHER): Payer: Medicare PPO | Admitting: Family Medicine

## 2019-10-01 ENCOUNTER — Other Ambulatory Visit: Payer: Self-pay | Admitting: General Practice

## 2019-10-01 VITALS — BP 131/82 | HR 79 | Temp 97.9°F | Resp 16 | Ht 64.0 in | Wt 130.2 lb

## 2019-10-01 DIAGNOSIS — M797 Fibromyalgia: Secondary | ICD-10-CM | POA: Diagnosis not present

## 2019-10-01 DIAGNOSIS — Z79899 Other long term (current) drug therapy: Secondary | ICD-10-CM | POA: Insufficient documentation

## 2019-10-01 DIAGNOSIS — E559 Vitamin D deficiency, unspecified: Secondary | ICD-10-CM | POA: Diagnosis not present

## 2019-10-01 DIAGNOSIS — T452X1A Poisoning by vitamins, accidental (unintentional), initial encounter: Secondary | ICD-10-CM

## 2019-10-01 DIAGNOSIS — F331 Major depressive disorder, recurrent, moderate: Secondary | ICD-10-CM

## 2019-10-01 LAB — B12 AND FOLATE PANEL
Folate: 23.9 ng/mL (ref >5.9)
Vitamin B-12: 860 pg/mL (ref 211–911)

## 2019-10-01 LAB — VITAMIN D 25 HYDROXY (VIT D DEFICIENCY, FRACTURES): VITD: >120 ng/mL

## 2019-10-01 LAB — BASIC METABOLIC PANEL
BUN: 25 mg/dL — ABNORMAL HIGH (ref 6–23)
CO2: 25 mEq/L (ref 19–32)
Calcium: 9.7 mg/dL (ref 8.4–10.5)
Chloride: 106 mEq/L (ref 96–112)
Creatinine, Ser: 0.9 mg/dL (ref 0.40–1.20)
GFR: 60.64 mL/min (ref >60.00)
Glucose, Bld: 97 mg/dL (ref 70–99)
Potassium: 4 mEq/L (ref 3.5–5.1)
Sodium: 140 mEq/L (ref 135–145)

## 2019-10-01 NOTE — Progress Notes (Signed)
   Subjective:    Patient ID: Andrea Simmons, female    DOB: 1940/04/02, 79 y.o.   MRN: [id number]  HPI Polypharmacy- pt reports Cardiology Pharmacist told her she was on too many medications.  She wants to talk about coming off them.  She's not sure which ones and doesn't know which ones I prescribe.  Initially said that my name was on all the bottles but then admits she didn't look at the bottles so she's not sure which ones my name.  Daughter says the recommendation was to stop Fenofibrate and Omega 3.  Daughter wants her off the Omeprazole due to bone health.  Vit D deficiency- pt was told her Vit D levels 'were dangerously low'.  She is on 50,000 units weekly.  Daughter is worried about parathyroid disease given Ca of 10.5 and low Vit D.  Fibro- daughter wants B12 checked.  Depression- daughter is upset that she is on Ritalin 20mg  daily.  She is also on Cymbalta 60mg  BID.   Review of Systems For ROS see HPI   This visit occurred during the SARS-CoV-2 public health emergency.  Safety protocols were in place, including screening questions prior to the visit, additional usage of staff PPE, and extensive cleaning of exam room while observing appropriate contact time as indicated for disinfecting solutions.       Objective:   Physical Exam Vitals reviewed.  Constitutional:      General: She is not in acute distress.    Appearance: Normal appearance. She is well-developed.  HENT:     Head: Normocephalic and atraumatic.  Eyes:     Conjunctiva/sclera: Conjunctivae normal.     Pupils: Pupils are equal, round, and reactive to light.  Neck:     Thyroid: No thyromegaly.  Cardiovascular:     Rate and Rhythm: Normal rate and regular rhythm.     Heart sounds: Normal heart sounds. No murmur heard.   Pulmonary:     Effort: Pulmonary effort is normal. No respiratory distress.     Breath sounds: Normal breath sounds.  Abdominal:     General: There is no distension.     Palpations: Abdomen  is soft.     Tenderness: There is no abdominal tenderness.  Musculoskeletal:     Cervical back: Normal range of motion and neck supple.  Lymphadenopathy:     Cervical: No cervical adenopathy.  Skin:    General: Skin is warm and dry.  Neurological:     Mental Status: She is alert and oriented to person, place, and time.  Psychiatric:        Behavior: Behavior normal.           Assessment & Plan:

## 2019-10-01 NOTE — Patient Instructions (Signed)
Schedule your complete physical in November We'll notify you of your lab results and make any changes if needed You can stop the Omeprazole and if reflux symptoms don't return, there is no need to take it Make sure you ask Dr Clovis Pu for a therapy referral Call with any questions or concerns Have a great summer!

## 2019-10-02 ENCOUNTER — Ambulatory Visit (INDEPENDENT_AMBULATORY_CARE_PROVIDER_SITE_OTHER): Payer: Medicare PPO

## 2019-10-02 DIAGNOSIS — T452X1A Poisoning by vitamins, accidental (unintentional), initial encounter: Secondary | ICD-10-CM | POA: Diagnosis not present

## 2019-10-02 NOTE — Progress Notes (Addendum)
ekg  EKG w/o change since previous and no arrhythmia noted

## 2019-10-03 ENCOUNTER — Other Ambulatory Visit: Payer: Self-pay

## 2019-10-03 DIAGNOSIS — F331 Major depressive disorder, recurrent, moderate: Secondary | ICD-10-CM

## 2019-10-03 LAB — PTH, INTACT AND CALCIUM
Calcium: 9.7 mg/dL (ref 8.6–10.4)
PTH: 23 pg/mL (ref 14–64)

## 2019-10-03 LAB — EXTRA SPECIMEN

## 2019-10-03 MED ORDER — METHYLPHENIDATE HCL 20 MG PO TABS: 20.0000 mg | ORAL_TABLET | Freq: Two times a day (BID) | ORAL | 0 refills | Status: DC

## 2019-10-13 ENCOUNTER — Telehealth: Payer: Self-pay | Admitting: Pharmacist

## 2019-10-13 NOTE — Telephone Encounter (Signed)
Med list reviewed and updated. Recent Vit D overdose and pt has stopped all VitD/Calcium supplementation.Seeing a neurologist for gait issues.

## 2019-10-15 ENCOUNTER — Ambulatory Visit: Payer: Medicare PPO

## 2019-10-20 ENCOUNTER — Ambulatory Visit: Payer: Medicare PPO | Admitting: General Practice

## 2019-10-20 ENCOUNTER — Other Ambulatory Visit: Payer: Self-pay

## 2019-10-20 DIAGNOSIS — I2699 Other pulmonary embolism without acute cor pulmonale: Secondary | ICD-10-CM | POA: Diagnosis not present

## 2019-10-20 DIAGNOSIS — Z7901 Long term (current) use of anticoagulants: Secondary | ICD-10-CM

## 2019-10-20 LAB — POCT INR: INR: 1.3 — AB (ref 2.0–3.0)

## 2019-10-20 NOTE — Patient Instructions (Addendum)
Pre visit review using our clinic review tool, if applicable. No additional management support is needed unless otherwise documented below in the visit note.  Take 1 tablet today, tomorrow and Wednesday.  On Thurday start taking 1/2 tablet daily except 1 tablet on Monday Wed and Fridays.  Re-check in 7 to 10 days.

## 2019-10-24 ENCOUNTER — Encounter: Payer: Self-pay | Admitting: Family Medicine

## 2019-10-27 ENCOUNTER — Other Ambulatory Visit: Payer: Self-pay

## 2019-10-27 ENCOUNTER — Ambulatory Visit (INDEPENDENT_AMBULATORY_CARE_PROVIDER_SITE_OTHER): Payer: Medicare PPO | Admitting: General Practice

## 2019-10-27 DIAGNOSIS — I2699 Other pulmonary embolism without acute cor pulmonale: Secondary | ICD-10-CM | POA: Diagnosis not present

## 2019-10-27 DIAGNOSIS — Z7901 Long term (current) use of anticoagulants: Secondary | ICD-10-CM

## 2019-10-27 LAB — POCT INR: INR: 1.7 — AB (ref 2.0–3.0)

## 2019-10-27 NOTE — Progress Notes (Signed)
I have reviewed the results and agree with this plan   

## 2019-10-27 NOTE — Patient Instructions (Signed)
Pre visit review using our clinic review tool, if applicable. No additional management support is needed unless otherwise documented below in the visit note.  Take 1 1/2 tablets today (8/9) and then change dosage and take 1 tablet daily except 1/2 tablet on Sunday, Tuesday and Thursdays.  Re-check in 3 weeks.

## 2019-10-29 ENCOUNTER — Other Ambulatory Visit: Payer: Self-pay | Admitting: Psychiatry

## 2019-10-29 DIAGNOSIS — E119 Type 2 diabetes mellitus without complications: Secondary | ICD-10-CM | POA: Diagnosis not present

## 2019-10-30 NOTE — Telephone Encounter (Signed)
Should she still be taking?

## 2019-10-31 ENCOUNTER — Other Ambulatory Visit: Payer: Self-pay | Admitting: Family Medicine

## 2019-11-17 ENCOUNTER — Ambulatory Visit (INDEPENDENT_AMBULATORY_CARE_PROVIDER_SITE_OTHER): Payer: Medicare PPO | Admitting: Psychiatry

## 2019-11-17 ENCOUNTER — Ambulatory Visit: Payer: Medicare PPO | Admitting: General Practice

## 2019-11-17 ENCOUNTER — Encounter: Payer: Self-pay | Admitting: Psychiatry

## 2019-11-17 ENCOUNTER — Other Ambulatory Visit: Payer: Self-pay

## 2019-11-17 DIAGNOSIS — R7989 Other specified abnormal findings of blood chemistry: Secondary | ICD-10-CM

## 2019-11-17 DIAGNOSIS — F331 Major depressive disorder, recurrent, moderate: Secondary | ICD-10-CM | POA: Diagnosis not present

## 2019-11-17 DIAGNOSIS — F4001 Agoraphobia with panic disorder: Secondary | ICD-10-CM

## 2019-11-17 DIAGNOSIS — G4721 Circadian rhythm sleep disorder, delayed sleep phase type: Secondary | ICD-10-CM

## 2019-11-17 DIAGNOSIS — I2699 Other pulmonary embolism without acute cor pulmonale: Secondary | ICD-10-CM | POA: Diagnosis not present

## 2019-11-17 DIAGNOSIS — Z7901 Long term (current) use of anticoagulants: Secondary | ICD-10-CM

## 2019-11-17 DIAGNOSIS — F338 Other recurrent depressive disorders: Secondary | ICD-10-CM

## 2019-11-17 DIAGNOSIS — F411 Generalized anxiety disorder: Secondary | ICD-10-CM

## 2019-11-17 LAB — POCT INR: INR: 2.6 (ref 2.0–3.0)

## 2019-11-17 MED ORDER — DULOXETINE HCL 30 MG PO CPEP: ORAL_CAPSULE | ORAL | 0 refills | Status: DC

## 2019-11-17 MED ORDER — METHYLPHENIDATE HCL 10 MG PO TABS: 10.0000 mg | ORAL_TABLET | Freq: Two times a day (BID) | ORAL | 0 refills | Status: DC

## 2019-11-17 NOTE — Progress Notes (Signed)
Andrea Simmons [id number] [birth year] 79 y.o.  Subjective:   Patient ID:  Andrea Simmons is a 79 y.o. (DOB 20-Jun-1940) female.  Chief Complaint:  Chief Complaint  Patient presents with  . Follow-up  . Depression  . Fatigue    Depression        Associated symptoms include no decreased concentration and no suicidal ideas.   Andrea Simmons presents  today for recent exacerbation of depression..    At visit was July 09, 2018.  Vraylar was discontinued for lack of response.  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  This was partially helpful.  When  seen Aug 15, 2018 and Ritalin was increased to 15 mg twice daily because of partial response at the lower dose.  At visit and of July 2020.  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  The Ritalin was increased again to 20 mg twice daily.  seen December 04, 2018 & 02/2019.  Doesn't like winter.  Seasonal depression and crying more.  There were no med changes.   11/17/19 appt with the following noted: BP been higher and seeing Card and having med changes. Stopped Vit D bc level was high. Some discussion about the Ritalin over the BP control. Some increase depression in part over health issues.  No marital problems.  No SI.  Issues with daughter are a stressor.  D said she was mean when D was growing up and was too strict.  Felt D was ungrateful and that she and H did the best they could do.  I can't get over that and has told D about this.  Ritalin with less energizing benefit and less productive than she was..  Wears off around supper time.  At night cannot break habit of snacks at night.  Does not feel it kick in and wear off.  Back to old habits of staying up too late, now MN.  Needs 7-8 hours.  Always needed more than others.    I feel good taking it.  Most days feels pretty good.  Can have  crying spells without reason even before the Ritalin.   Doing much more than I was.    H still sick often too.  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   Past Psychiatric Medication Trials: Rozerem, zolpidem. Duloxetine 120, Paxil 20 for years, Sertraline, fluoxetine, Wellbutrin,  Abilify weight gain and loss benefit, Was More talkative on the Abilify and the Wellbutrin. lithium 2004 SE tremor at 666m daily.  Low dose nortriptyline, Vraylar NR, Buspar NR.  Review of Systems:  Review of Systems  Constitutional: Positive for unexpected weight change.  Cardiovascular: Negative for palpitations.  Musculoskeletal: Positive for arthralgias, back pain and joint swelling. Negative for neck stiffness.  Neurological: Negative for tremors, weakness and numbness.  Psychiatric/Behavioral: Positive for depression. Negative for agitation, behavioral problems, confusion, decreased concentration, dysphoric mood, hallucinations, self-injury, sleep disturbance and suicidal ideas. The patient is not nervous/anxious and is not hyperactive.    Hx anemia.    Cardiologist said she had a stiff heart.  History of pulmonary embolism and on Coumadin.  Medications: I have reviewed the patient's current medications.  Current Outpatient Medications  Medication Sig Dispense Refill  . Accu-Chek Softclix Lancets lancets Use as instructed to test sugars BID 100 each 12  . acetaminophen (TYLENOL)  500 MG tablet Take 500 mg by mouth every 6 (six) hours as needed.    Marland Kitchen amLODipine (NORVASC) 10 MG tablet Take 1 tablet (10 mg total) by mouth daily. (Patient taking differently: Take 10 mg by mouth daily. Taking it at night) 90 tablet 2  . BIOTIN PO Take by mouth. Calcium    . bisacodyl (DULCOLAX) 5 MG EC tablet Take 5 mg by mouth daily as needed for moderate constipation.    . Blood Glucose Monitoring Suppl (ACCU-CHEK GUIDE) w/Device KIT 1 each by Does not apply route 2 (two) times daily. To  test sugars. Dx. E11.9 1 kit 1  . DULoxetine (CYMBALTA) 30 MG capsule 3 capsules daily for 1 week, then 2 capsules daily for 1 week, then 1 capsule daily for 1 week, then stop duloxetine. 42 capsule 0  . fenofibrate (TRICOR) 48 MG tablet Take 1 tablet by mouth daily.    . furosemide (LASIX) 20 MG tablet TAKE 1 TABLET BY MOUTH AS NEEDED (Patient taking differently: Take 20 mg by mouth daily. Only takes as needed) 90 tablet 1  . glucose blood (ACCU-CHEK GUIDE) test strip CHECK BLOOD SUGARS DAILY 100 strip 12  . levothyroxine (SYNTHROID) 25 MCG tablet TAKE 1 TABLET BY MOUTH DAILY BEFORE BREAKFAST. 90 tablet 1  . Magnesium 500 MG TABS Take by mouth.    . metFORMIN (GLUCOPHAGE-XR) 500 MG 24 hr tablet Take 500 mg by mouth 2 (two) times daily.   3  . methylphenidate (RITALIN) 10 MG tablet Take 1 tablet (10 mg total) by mouth 2 (two) times daily. 180 tablet 0  . nitroGLYCERIN (NITROSTAT) 0.4 MG SL tablet Place 1 tablet (0.4 mg total) under the tongue every 5 (five) minutes as needed for chest pain. 30 tablet 3  . Omega-3 Fatty Acids (FISH OIL) 500 MG CAPS Take 1 capsule by mouth 2 (two) times daily.     . RESTASIS MULTIDOSE 0.05 % ophthalmic emulsion Place 1 drop into both eyes 2 (two) times daily.   6  . rosuvastatin (CRESTOR) 20 MG tablet Take 20 mg by mouth daily.     Marland Kitchen tiZANidine (ZANAFLEX) 2 MG tablet TAKE 1 TABLET BY MOUTH TWICE A DAY AS NEEDED (Patient taking differently: Take 1 mg by mouth at bedtime. Taking QOD. Pt weaning down.) 180 tablet 0  . valsartan-hydrochlorothiazide (DIOVAN-HCT) 320-25 MG tablet Take 1 tablet by mouth in the morning. 90 tablet 2  . warfarin (COUMADIN) 5 MG tablet 56m on Sunday& Thursday, 2.546mAll other days (Patient taking differently: 61m24mn Sunday, 2.61mg44ml other days) 90 tablet 1   No current facility-administered medications for this visit.    Medication Side Effects: None talkative more the [facility]  Allergies:  Allergies  Allergen Reactions  . Anaprox [Naproxen  Sodium]   . Lipitor [Atorvastatin]   . Morphine   . Meperidine Nausea And Vomiting  . Naproxen Sodium Nausea And Vomiting    Past Medical History:  Diagnosis Date  . Anemia   . Anxiety   . Breast cancer (HCC)[place]. Depression   . Diabetes mellitus type II   . Fibromyalgia   . GERD (gastroesophageal reflux disease)   . Hyperlipidemia   . Hypertension   . Malignant neoplasm of breast (female), unspecified site 1993   L breast s/p mastectomy and tamoxifen x 56yrs47yrOther pulmonary embolism and infarction 2008 and 2009   chronic anticoag - [facility]  . Unspecified hypothyroidism  Family History  Problem Relation Age of Onset  . Depression Other        Parent  . Arthritis Other        Parent, Grandparent  . Hypertension Other        Grandparent  . Hyperlipidemia Other        [place]  . Miscarriages / Stillbirths Other        Grandparent  . Stroke Other        [facility]  . Cancer Maternal Uncle        prostate  . Hypertension Maternal Grandfather   . Breast cancer Cousin   . Breast cancer Cousin     Social History   Socioeconomic History  . Marital status: Married    Spouse name: Not on file  . Number of children: 1  . Years of education: Not on file  . Highest education level: Not on file  Occupational History  . Occupation: Armed forces technical officer: RETIRED  . Occupation: Emergency planning/management officer  . Occupation: school bus driver  Tobacco Use  . Smoking status: Never Smoker  . Smokeless tobacco: Never Used  Vaping Use  . Vaping Use: Never used  Substance and Sexual Activity  . Alcohol use: No    Alcohol/week: 0.0 standard drinks  . Drug use: No  . Sexual activity: Not on file  Other Topics Concern  . Not on file  Social History Narrative  . Not on file   Social Determinants of Health   Financial Resource Strain:   . Difficulty of Paying Living Expenses: Not on file  Food Insecurity:   . Worried About Charity fundraiser in the Last Year: Not on file  . Ran  Out of Food in the Last Year: Not on file  Transportation Needs:   . Lack of Transportation (Medical): Not on file  . Lack of Transportation (Non-Medical): Not on file  Physical Activity:   . Days of Exercise per Week: Not on file  . Minutes of Exercise per Session: Not on file  Stress:   . Feeling of Stress : Not on file  Social Connections:   . Frequency of Communication with Friends and Family: Not on file  . Frequency of Social Gatherings with Friends and Family: Not on file  . Attends Religious Services: Not on file  . Active Member of Clubs or Organizations: Not on file  . Attends Archivist Meetings: Not on file  . Marital Status: Not on file  Intimate Partner Violence:   . Fear of Current or Ex-Partner: Not on file  . Emotionally Abused: Not on file  . Physically Abused: Not on file  . Sexually Abused: Not on file    Past Medical History, Surgical history, Social history, and Family history were reviewed and updated as appropriate.   Please see review of systems for further details on the patient's review from today.   Objective:   Physical Exam:  There were no vitals taken for this visit.  Physical Exam Constitutional:      General: She is not in acute distress.    Appearance: She is well-developed.  Musculoskeletal:        General: No deformity.  Neurological:     Mental Status: She is alert and oriented to person, place, and time.     Cranial Nerves: No dysarthria.     Coordination: Coordination normal.  Psychiatric:        Attention and Perception: Attention normal.  She is attentive.        Mood and Affect: Mood is depressed. Mood is not anxious. Affect is not labile, blunt, angry or inappropriate.        Speech: Speech normal.        Behavior: Behavior normal. Behavior is cooperative.        Thought Content: Thought content normal. Thought content is not paranoid or delusional. Thought content does not include homicidal or suicidal ideation.  Thought content does not include homicidal or suicidal plan.        Cognition and Memory: Cognition and memory normal.        Judgment: Judgment normal.     Comments: Insight fair-good.  Worse mood probably related to problems with daughter. Talkative     Lab Review:     Component Value Date/Time   NA 140 10/01/2019 1030   NA 140 05/28/2019 1609   NA 141 08/01/2016 1527   K 4.0 10/01/2019 1030   K 4.6 08/01/2016 1527   CL 106 10/01/2019 1030   CO2 25 10/01/2019 1030   CO2 24 08/01/2016 1527   GLUCOSE 97 10/01/2019 1030   GLUCOSE 93 08/01/2016 1527   BUN 25 (H) 10/01/2019 1030   BUN 17 05/28/2019 1609   BUN 19.9 08/01/2016 1527   CREATININE 0.90 10/01/2019 1030   CREATININE 0.9 08/01/2016 1527   CALCIUM 9.7 10/01/2019 1030   CALCIUM 9.7 10/01/2019 1030   CALCIUM 9.7 08/01/2016 1527   PROT 6.6 09/10/2019 1113   PROT 7.2 08/01/2016 1527   PROT 6.8 08/01/2016 1527   ALBUMIN 4.4 09/10/2019 1113   ALBUMIN 4.0 08/01/2016 1527   AST 40 (H) 09/10/2019 1113   AST 41 (H) 08/01/2016 1527   ALT 25 09/10/2019 1113   ALT 25 08/01/2016 1527   ALKPHOS 43 09/10/2019 1113   ALKPHOS 48 08/01/2016 1527   BILITOT 0.6 09/10/2019 1113   BILITOT 0.33 08/01/2016 1527   GFRNONAA 66 05/28/2019 1609   GFRAA 76 05/28/2019 1609       Component Value Date/Time   WBC 8.9 08/06/2019 1356   RBC 4.04 08/06/2019 1356   HGB 12.7 08/06/2019 1356   HGB 9.5 (L) 08/01/2016 1527   HCT 37.8 08/06/2019 1356   HCT 31.2 (L) 08/01/2016 1527   PLT 257.0 Repeated and verified X2. 08/06/2019 1356   PLT 221 08/01/2016 1527   MCV 93.6 08/06/2019 1356   MCV 83.9 08/01/2016 1527   MCH 30.4 10/17/2018 1204   MCHC 33.6 08/06/2019 1356   RDW 13.8 08/06/2019 1356   RDW 17.2 (H) 08/01/2016 1527   LYMPHSABS 1.4 08/06/2019 1356   LYMPHSABS 2.0 08/01/2016 1527   MONOABS 0.7 08/06/2019 1356   MONOABS 0.4 08/01/2016 1527   EOSABS 0.0 08/06/2019 1356   EOSABS 0.1 08/01/2016 1527   BASOSABS 0.0 08/06/2019 1356    BASOSABS 0.0 08/01/2016 1527    No results found for: POCLITH, LITHIUM   No results found for: PHENYTOIN, PHENOBARB, VALPROATE, CBMZ   Endo checked thyroid lately.   Vitamin D level on April 01, 2018 reported as 40.  After that vitamin D 50,000 units weekly was prescribed with a goal of achieving levels in the 50s and 60s.  December 04, 2018 vitamin D 25 off supplement .res Assessment: Plan:    Isobel was seen today for follow-up, depression and fatigue.  Diagnoses and all orders for this visit:  Major depressive disorder, recurrent episode, moderate (HCC) -     methylphenidate (RITALIN) 10 MG  tablet; Take 1 tablet (10 mg total) by mouth 2 (two) times daily. -     DULoxetine (CYMBALTA) 30 MG capsule; 3 capsules daily for 1 week, then 2 capsules daily for 1 week, then 1 capsule daily for 1 week, then stop duloxetine.  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  Panic disorder with agoraphobia -     DULoxetine (CYMBALTA) 30 MG capsule; 3 capsules daily for 1 week, then 2 capsules daily for 1 week, then 1 capsule daily for 1 week, then stop duloxetine.  Generalized anxiety disorder -     DULoxetine (CYMBALTA) 30 MG capsule; 3 capsules daily for 1 week, then 2 capsules daily for 1 week, then 1 capsule daily for 1 week, then stop duloxetine.  Delayed sleep phase syndrome  Low vitamin D level     Greater than 50% of  30 min face to face time with patient was spent on counseling and coordination of care. We discussed several things.  She is not 100% free of depression she has residual low energy and motivation but her mood is substantially better than it was before. Clear benefit with Ritalin.  She is overall satisfied with the medication.  Discussed potential benefits, risks, and side effects of stimulants with patient to include  increased heart rate, palpitations, insomnia, increased anxiety, increased irritability, or decreased appetite.  Instructed patient to contact office if experiencing any significant tolerability issues. She does not appear manic and not sig different from the last visit. Still talkative on it but not manic.  Start taper of  Ritalin to  10 mg twice daily.  Discussed side effects again as noted in the last visit including cardiac risk Hesitate to increase further DT age, BP problems etc  Options pramipexole, Rexulti,  change to Trintellixl.  Disc SE and options.    Reduce Ritalin to 10 mg twice daily due to loss of benefit and blood pressure  Start change from duloxetine to Trintellix to help depression: Reduce duloxetine to 3 of 30 mg capsules and start Trintellix 5 mg daily for 1 week, Then reduce duloxetine to 2 of the 30 mg capsules and increase Trintellix to 10 mg daily for 1 week, Then increase Trintellix to 10 + 5 mg tablets and reduce duloxetine to 1 of the 30 mg capsules for 1 week, Then stop Duloxetine and increase Trintellix to 20 mg daily (or 2 of the 10 mg tablets)  Option counseling but availability is limited for options. But on list for Rinaldo Cloud  FU 6 mos  Lynder Parents, MD, DFAPA   Please see After Visit Summary for patient specific instructions.  Future Appointments  Date Time Provider [place]  12/02/2019  2:00 PM Dohmeier, Asencion Partridge, MD [facility] None  12/08/2019  3:00 PM Nigel Mormon, MD [facility] None  12/15/2019  1:00 PM [facility]  01/26/2020 12:30 PM [name], MD [facility]    No orders of the defined types were placed in this encounter.     -------------------------------

## 2019-11-17 NOTE — Patient Instructions (Addendum)
Pre visit review using our clinic review tool, if applicable. No additional management support is needed unless otherwise documented below in the visit note.  Continue to take 1 tablet daily except 1/2 tablet on Sunday, Tuesday and Thursdays.  Re-check in 4 weeks.

## 2019-11-17 NOTE — Patient Instructions (Addendum)
Reduce Ritalin to 10 mg twice daily due to loss of benefit and blood pressure  Start change from duloxetine to Trintellix to help depression: Reduce duloxetine to 3 of 30 mg capsules and start Trintellix 5 mg daily for 1 week, Then reduce duloxetine to 2 of the 30 mg capsules and increase Trintellix to 10 mg daily for 1 week, Then increase Trintellix to 10 + 5 mg tablets and reduce duloxetine to 1 of the 30 mg capsules for 1 week, Then stop Duloxetine and increase Trintellix to 20 mg daily (or 2 of the 10 mg tablets)

## 2019-11-18 DIAGNOSIS — I10 Essential (primary) hypertension: Secondary | ICD-10-CM | POA: Diagnosis not present

## 2019-11-26 ENCOUNTER — Telehealth: Payer: Self-pay | Admitting: Psychiatry

## 2019-11-26 NOTE — Telephone Encounter (Signed)
Pt left a message stating that she would like to talk to dr. Casimiro Needle assistant. Please give her a call at [phone]

## 2019-11-27 NOTE — Telephone Encounter (Signed)
noted 

## 2019-11-27 NOTE — Telephone Encounter (Signed)
Spoke with patient and answered her questions about her med changes. No further action needed.

## 2019-11-27 NOTE — Telephone Encounter (Signed)
Tried to reach patient but no answer and unable to leave message.  Andrea Simmons can you please try as well.

## 2019-12-02 ENCOUNTER — Encounter: Payer: Self-pay | Admitting: Neurology

## 2019-12-02 ENCOUNTER — Ambulatory Visit: Payer: Medicare PPO | Admitting: Neurology

## 2019-12-02 VITALS — BP 158/84 | HR 77 | Ht 63.0 in | Wt 133.0 lb

## 2019-12-02 DIAGNOSIS — Z9181 History of falling: Secondary | ICD-10-CM | POA: Diagnosis not present

## 2019-12-02 DIAGNOSIS — Z7901 Long term (current) use of anticoagulants: Secondary | ICD-10-CM | POA: Diagnosis not present

## 2019-12-02 DIAGNOSIS — E119 Type 2 diabetes mellitus without complications: Secondary | ICD-10-CM | POA: Diagnosis not present

## 2019-12-02 DIAGNOSIS — Z79899 Other long term (current) drug therapy: Secondary | ICD-10-CM | POA: Diagnosis not present

## 2019-12-02 NOTE — Progress Notes (Signed)
Provider:  Larey Seat, MD  Primary Care Physician:  [name], MD [address] [place] [address]     Referring Provider: Dr. Titus Mould, MD endocrinologist          Chief Complaint according to patient   Patient presents with:    . New Patient (Initial Visit)     rheumatologist referred here because when they evaluated her she noted that she lost balance and almost fell. she states that this has been going off and on for a while. there are times where its worse then others. between March and now, she has fallen 4 times.       HISTORY OF PRESENT ILLNESS:  Andrea Simmons is a 79 year-  old Caucasian female patient and married, a retired Theme park manager , and seen here upon consultation request buy her rheumatologist/ endocrinologist , on 12/02/2019. Chief concern according to patient :     I have the pleasure of seeing Andrea Simmons today, a right -handed White or Caucasian female with balance problems , more frequent falls.  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 History of vertigo, many years ago- none in years.    First fall : no nausea, no dizziness, She was cleaning windows , stood on a ladder, fell to the floor and hit the left shoulder on the stoves bottom drawer.  2) she was outside and slipped on pine needles, left side of the face and body hit the ground , but not bruised.  3) night, at home in the bathroom standing in front of the vanity- cut her hair- and fell as she turned right- . She fell into the shower , hit the right.  4) July 2021- fourth fall in th bathroom again while cleaning the sink, stood at the sink and fell  again, this time fell on the forehead, hit the pedestal of the sink.      Review of Systems: Out of a complete 14 system review, the patient complains of only the following symptoms, and all other reviewed systems are negative.:    No recollection of the falls.- memory loss, logorrhoea, falls. History of vertigo, BP control is poor. She has ankle edema.      Social History   Socioeconomic History  . Marital status: Married    Spouse name: Not on file  . Number of children: 1  . Years of education: Not on file  . Highest education level: Not on file  Occupational History  . Occupation: Armed forces technical officer: RETIRED  . Occupation: Emergency planning/management officer  . Occupation: school bus driver  Tobacco Use  . Smoking status: Never Smoker  . Smokeless tobacco: Never Used  Vaping Use  . Vaping Use: Never used  Substance and Sexual Activity  . Alcohol use: No    Alcohol/week: 0.0 standard drinks  . Drug use: No  . Sexual activity: Not on file  Other Topics Concern  . Not on file  Social History Narrative  . Not on file   Social Determinants of Health   Financial Resource Strain:   . Difficulty of Paying Living Expenses: Not on file  Food Insecurity:   . Worried About  Running Out of Food in the Last Year: Not on file  . Ran Out of Food in the Last Year: Not on file  Transportation Needs:   . Lack of Transportation (Medical): Not on file  . Lack of Transportation (Non-Medical): Not on file  Physical Activity:   . Days of Exercise per Week: Not on file  . Minutes of Exercise per Session: Not on file  Stress:   . Feeling of Stress : Not on file  Social Connections:   . Frequency of Communication with Friends and Family: Not on file  . Frequency of Social Gatherings with Friends and Family: Not on file  . Attends Religious Services: Not on file  . Active Member of Clubs or Organizations: Not on file  . Attends Archivist Meetings: Not on file  . Marital Status: Not  on file    Family History  Problem Relation Age of Onset  . Depression Other        Parent  . Arthritis Other        Parent, Grandparent  . Hypertension Other        Grandparent  . Hyperlipidemia Other        [place]  . Miscarriages / Stillbirths Other        Grandparent  . Stroke Other        [facility]  . Cancer Maternal Uncle        prostate  . Hypertension Maternal Grandfather   . Breast cancer Cousin   . Breast cancer Cousin     Past Medical History:  Diagnosis Date  . Anemia   . Anxiety   . Breast cancer ([place])   . Depression   . Diabetes mellitus type II   . Fibromyalgia   . GERD (gastroesophageal reflux disease)   . Hyperlipidemia   . Hypertension   . Malignant neoplasm of breast (female), unspecified site 1993   L breast s/p mastectomy and tamoxifen x 47yr  . 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 Unspecified hypothyroidism     Past Surgical History:  Procedure Laterality Date  . APPENDECTOMY    . BREAST BIOPSY Left 1993  . BREAST BIOPSY Right 09/25/2014   stero. Benign  . BREAST RECONSTRUCTION Left   . CATARACT EXTRACTION    . MASTECTOMY Left   . ROTATOR CUFF REPAIR    . SPINAL FUSION      x 2  . TOTAL ABDOMINAL HYSTERECTOMY W/ BILATERAL SALPINGOOPHORECTOMY    . TUBAL LIGATION       Current Outpatient Medications on File Prior to Visit  Medication Sig Dispense Refill  . Accu-Chek Softclix Lancets lancets Use as instructed to test sugars BID 100 each 12  . acetaminophen (TYLENOL) 500 MG tablet Take 500 mg by mouth every 6 (six) hours as needed.    .Marland KitchenamLODipine (NORVASC) 10 MG tablet Take 1 tablet (10 mg total) by mouth daily. (Patient taking differently: Take 10 mg by mouth daily. Taking it at night) 90 tablet 2  . BIOTIN PO Take by mouth. [place]   . bisacodyl (DULCOLAX) 5 MG EC tablet Take 5 mg by mouth daily as needed for moderate constipation.    . Blood Glucose Monitoring Suppl (ACCU-CHEK  GUIDE) w/Device KIT 1 each by Does not apply route 2 (two) times daily. To test sugars. Dx. E11.9 1 kit 1  . DULoxetine (CYMBALTA) 30 MG capsule 3  capsules daily for 1 week, then 2 capsules daily for 1 week, then 1 capsule daily for 1 week, then stop duloxetine. 42 capsule 0  . fenofibrate (TRICOR) 48 MG tablet Take 1 tablet by mouth daily.    . furosemide (LASIX) 20 MG tablet TAKE 1 TABLET BY MOUTH AS NEEDED (Patient taking differently: Take 20 mg by mouth daily. Only takes as needed) 90 tablet 1  . glucose blood (ACCU-CHEK GUIDE) test strip CHECK BLOOD SUGARS DAILY 100 strip 12  . levothyroxine (SYNTHROID) 25 MCG tablet TAKE 1 TABLET BY MOUTH DAILY BEFORE BREAKFAST. 90 tablet 1  . Magnesium 500 MG TABS Take by mouth.    . metFORMIN (GLUCOPHAGE-XR) 500 MG 24 hr tablet Take 500 mg by mouth 2 (two) times daily.   3  . methylphenidate (RITALIN) 10 MG tablet Take 1 tablet (10 mg total) by mouth 2 (two) times daily. 180 tablet 0  . nitroGLYCERIN (NITROSTAT) 0.4 MG SL tablet Place 1 tablet (0.4 mg total) under the tongue every 5 (five) minutes as needed for chest pain. 30 tablet 3  . Omega-3 Fatty Acids (FISH OIL) 500 MG CAPS Take 1 capsule by mouth 2 (two) times daily.     . RESTASIS MULTIDOSE 0.05 % ophthalmic emulsion Place 1 drop into both eyes 2 (two) times daily.   6  . rosuvastatin (CRESTOR) 20 MG tablet Take 20 mg by mouth daily.     Marland Kitchen tiZANidine (ZANAFLEX) 2 MG tablet TAKE 1 TABLET BY MOUTH TWICE A DAY AS NEEDED (Patient taking differently: Take 1 mg by mouth at bedtime. Taking QOD. Pt weaning down.) 180 tablet 0  . valsartan-hydrochlorothiazide (DIOVAN-HCT) 320-25 MG tablet Take 1 tablet by mouth in the morning. 90 tablet 2  . Vitamin D, Ergocalciferol, (DRISDOL) 1.25 MG (50000 UNIT) CAPS capsule     . vortioxetine HBr (TRINTELLIX) 10 MG TABS tablet Take 10 mg by mouth daily.    Marland Kitchen warfarin (COUMADIN) 5 MG tablet 64m on Sunday& Thursday, 2.570mAll other days (Patient taking differently: 27m80mn  Sunday, 2.27mg71ml other days) 90 tablet 1   No current facility-administered medications on file prior to visit.    Allergies  Allergen Reactions  . Anaprox [Naproxen Sodium]   . Lipitor [Atorvastatin]   . Morphine   . Meperidine Nausea And Vomiting  . Naproxen Sodium Nausea And Vomiting    Physical exam:  Today's Vitals   12/02/19 1406  BP: (!) 158/84  Pulse: 77  Weight: 133 lb (60.3 kg)  Height: '5\' 3"'  (1.6 m)   Body mass index is 23.56 kg/m.   Wt Readings from Last 3 Encounters:  12/02/19 133 lb (60.3 kg)  10/01/19 130 lb 4 oz (59.1 kg)  09/10/19 133 lb 12.8 oz (60.7 kg)     Ht Readings from Last 3 Encounters:  12/02/19 '5\' 3"'  (1.6 m)  10/01/19 '5\' 4"'  (1.626 m)  09/10/19 '5\' 4"'  (1.626 m)      General: The patient is awake, alert and appears not in acute distress.  The patient is well groomed. Head: Normocephalic, atraumatic. Neck is supple.   Cardiovascular:  Regular rate and cardiac rhythm by pulse,  without distended neck veins. Respiratory: Lungs are clear to auscultation.  Skin:  Without evidence of ankle edema, or rash. Trunk: The patient's posture is erect.   Neurologic exam : The patient is awake and alert, oriented to place and time.   Memory subjective described as intact.  Attention span & concentration ability appears normal.  Speech is fluent,  without  dysarthria, dysphonia or aphasia.  Mood and affect are appropriate.   Cranial nerves: no loss of smell or taste reported  Pupils are equal and briskly reactive to light. Funduscopic exam deferred. Extraocular movements in vertical and horizontal planes were intact and without nystagmus. No Diplopia. Visual fields by finger perimetry are intact. Hearing was intact to soft voice and finger rubbing. Facial sensation intact to fine touch. Facial motor strength is symmetric and tongue and uvula move midline.  Neck ROM : rotation, tilt and flexion extension were normal for age and shoulder shrug was  symmetrical. At baseline she kept the right shoulder elevated .    Motor exam:  Symmetric bulk, tone and ROM.   Increased  tone without cog- wheeling on the left and more rigor on the right , symmetrically reduced grip strength .   Sensory:  Fine touch, pinprick and vibration were normal.  Proprioception tested in the upper extremities was normal. She wore compression stockings.    Coordination: Rapid alternating movements in the fingers/hands were of normal speed.  The Finger-to-nose maneuver was intact without evidence of ataxia, dysmetria or tremor. She had some r trouble following the instructions.    Gait and station: Patient could rise unassisted from a seated position, walked without assistive device.  Stance is of normal width/ base and the patient turned with 5-6  steps.  She took very small steps and had normal arm swing. It was noticeable that she was afraid of falling when turning.  Toe and heel walk were deferred.  Deep tendon reflexes: in the  upper and lower extremities are symmetric and intact.  Babinski response was downgoing.     CT HEAD WITHOUT CONTRAST  TECHNIQUE: Contiguous axial images were obtained from the base of the skull through the vertex without intravenous contrast.  COMPARISON:  09/01/2013  FINDINGS: Brain: There is atrophy and chronic small vessel disease changes. No acute intracranial abnormality. Specifically, no hemorrhage, hydrocephalus, mass lesion, acute infarction, or significant intracranial injury.  Vascular: No hyperdense vessel or unexpected calcification.  Skull: No acute calvarial abnormality.  Sinuses/Orbits: Visualized paranasal sinuses and mastoids clear. Orbital soft tissues unremarkable.  Other: None  IMPRESSION: Atrophy, chronic microvascular disease.  No acute intracranial abnormality.   Electronically Signed   By: Rolm Baptise M.D.    She is fully vaccinated against COVID-19, her Covid screening test  here was negative.  She has been treated for chronic fatigue hypothyroidism in 1993 she had suffered from breast cancer and narcotic contract was signed by the patient on 5-18 2015 with her primary care providers.  She had eyelid repair surgery in Simmons 2019.  She has ankle edema may have peripheral neuropathy.  I do wonder if her balance problems are related to an latent vertigo tendency forward to her recently uncontrolled blood pressure is certainly also the possibility of neuropathy contributing.  She does not have right-sided weakness.  Coordination is not an issue she was good at performing the finger-nose test there was no ataxia.  The only gait problem is when she turns and since she cannot recall all the details of each of her falls I do wonder if she may have turned her head or bend her head and lost traction this way.  She has normal reflexes.   CT neck Skull base and vertebrae: No acute fracture. No primary bone lesion or focal pathologic process.  Soft tissues and spinal canal: No prevertebral fluid or swelling. No visible  canal hematoma.  Disc levels: Prior fusion C5-6 and C6-7. Posterior cerclage wires present. Advanced diffuse degenerative facet disease.  IMPRESSION: Prior posterior fusion C5-6 and C6-7. Degenerative disc and facet disease diffusely. No acute bony abnormality. Electronically Signed By: Rolm Baptise M.D.  CT head Brain: There is atrophy and chronic small vessel disease changes. No acute intracranial abnormality. Specifically, no hemorrhage, hydrocephalus, mass lesion, acute infarction, or significant intracranial injury.  There seems to be more fluid on the right frontal pole, may be a result of atrophy.       After spending a total time of 45 minutes face to face and additional time for physical and neurologic examination, review of laboratory studies,  personal review of imaging studies, reports and results of other testing and review of referral  information / records as far as provided in visit, I have established the following assessments:  1) gait impairment , walks well on even surfaces and with walking a straight line, but walks slow,  -unsteady turns- this patient can benefit from using a cane , walking stick outside.  2) no fainting , no dizziness reported.  3) no hip, knee or lumbar disk surgery. It was at the very end of our visit that she finally reported a histoyr of back pain.    Dear Dr. Chalmers Cater,   1) I recommend to continue with PT , gait stabilisation and medication reduction, hopefully achiving better BP control. She may need training on grass, gravel and sandy surfaces.   She is a former pain managed patient, and she is treated for mayor depression. She has been seen by Dr Andree Elk for back pain. Her stiffness may be a result of low back DDD.   Fairly non focal neurological evaluation - even neuropathy was not clearly evident.   I would not need MRI , the head CT was clearly showing white matter microvascular disease for which HTN is a main cause.     I would like to thank [name], MD and [name], Md [address] [place],  [place] [address] for allowing me to meet with and to take care of this pleasant patient.   In short, Andrea Simmons is presenting with 4 falls, all unexplained , and a normal neurological examination.  I do not plan to follow up either personally or through our NP .   CC: I will share my notes with PCP .  Electronically signed by: Larey Seat, MD 12/02/2019 2:23 PM  [facility] Board certified by The [employer] of Sleep Medicine and Diplomate of the [employer] of Sleep Medicine. Board certified In Neurology through the [place], Fellow of the [employer] of Neurology. Medical Director of [employer].

## 2019-12-02 NOTE — Patient Instructions (Signed)

## 2019-12-08 ENCOUNTER — Encounter: Payer: Self-pay | Admitting: Cardiology

## 2019-12-08 ENCOUNTER — Ambulatory Visit: Payer: Medicare PPO | Admitting: Cardiology

## 2019-12-08 ENCOUNTER — Other Ambulatory Visit: Payer: Self-pay

## 2019-12-08 VITALS — BP 136/81 | HR 78 | Wt 133.0 lb

## 2019-12-08 DIAGNOSIS — I10 Essential (primary) hypertension: Secondary | ICD-10-CM

## 2019-12-08 DIAGNOSIS — R600 Localized edema: Secondary | ICD-10-CM

## 2019-12-08 DIAGNOSIS — R0600 Dyspnea, unspecified: Secondary | ICD-10-CM | POA: Diagnosis not present

## 2019-12-08 DIAGNOSIS — R0609 Other forms of dyspnea: Secondary | ICD-10-CM

## 2019-12-08 NOTE — Progress Notes (Signed)
Follow up visit  Subjective:   Andrea Simmons, female    DOB: June 19, 1940, 79 y.o.   MRN: [id number]   Chief Complaint  Patient presents with  . 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 Avg BP 156/92 mmHg, HR 64/min. BP is improving with down titration of methylphenidate.   Current Outpatient Medications on File Prior to Visit  Medication Sig Dispense Refill  . Accu-Chek Softclix Lancets lancets Use as instructed to test sugars BID 100 each 12  . acetaminophen (TYLENOL) 500 MG tablet Take 500 mg by mouth every 6 (six) hours as needed.    Marland Kitchen amLODipine (NORVASC) 10 MG tablet Take 1 tablet (10 mg total) by mouth daily. (Patient taking differently: Take 10 mg by mouth daily. Taking it at night) 90 tablet 2  . BIOTIN PO Take by mouth. [place]    . bisacodyl (DULCOLAX) 5 MG EC tablet Take 5 mg by mouth daily as needed for moderate constipation.    . Blood Glucose Monitoring Suppl (ACCU-CHEK GUIDE) w/Device KIT 1 each by Does not apply route 2 (two) times daily. To test sugars. Dx. E11.9 1 kit 1  . DULoxetine (CYMBALTA) 30 MG capsule 3 capsules daily for 1 week, then 2 capsules daily for 1 week, then 1 capsule daily for 1 week, then stop duloxetine. 42 capsule 0  . fenofibrate (TRICOR) 48 MG tablet Take 1 tablet by mouth daily.    . furosemide (LASIX) 20 MG tablet TAKE 1 TABLET BY MOUTH AS NEEDED (Patient taking differently: Take 20 mg by mouth daily. Only takes as needed) 90 tablet 1  . glucose blood (ACCU-CHEK GUIDE) test strip CHECK BLOOD SUGARS DAILY 100 strip 12  . levothyroxine (SYNTHROID) 25 MCG tablet TAKE 1 TABLET BY MOUTH DAILY BEFORE BREAKFAST. 90 tablet 1  . Magnesium 500 MG TABS Take by mouth.    . metFORMIN (GLUCOPHAGE-XR) 500 MG 24 hr tablet Take 500 mg by mouth 2 (two) times  daily.   3  . methylphenidate (RITALIN) 10 MG tablet Take 1 tablet (10 mg total) by mouth 2 (two) times daily. 180 tablet 0  . nitroGLYCERIN (NITROSTAT) 0.4 MG SL tablet Place 1 tablet (0.4 mg total) under the tongue every 5 (five) minutes as needed for chest pain. 30 tablet 3  . Omega-3 Fatty Acids (FISH OIL) 500 MG CAPS Take 1 capsule by mouth 2 (two) times daily.     . RESTASIS MULTIDOSE 0.05 % ophthalmic emulsion Place 1 drop into both eyes 2 (two) times daily.   6  . rosuvastatin (CRESTOR) 20 MG tablet Take 20 mg by mouth daily.     Marland Kitchen tiZANidine (ZANAFLEX) 2 MG tablet TAKE 1 TABLET BY MOUTH TWICE A DAY AS NEEDED (Patient taking differently: Take 1 mg by mouth at bedtime. Taking QOD. Pt weaning down.) 180 tablet 0  . valsartan-hydrochlorothiazide (DIOVAN-HCT) 320-25 MG tablet Take 1 tablet by mouth in the morning. 90 tablet 2  . Vitamin D, Ergocalciferol, (DRISDOL) 1.25 MG (50000 UNIT) CAPS capsule     . vortioxetine HBr (TRINTELLIX) 10 MG TABS tablet Take 10 mg by mouth daily.    Marland Kitchen warfarin (COUMADIN) 5 MG tablet 133m on Sunday& Thursday, 2.579mAll other days (Patient taking differently: 33m9mn Sunday, 2.33mg51ml other days) 90 tablet 1   No current facility-administered medications on file  prior to visit.    Cardiovascular studies:  EKG 09/05/2019: Sinus rhythm 81 bpm  Right bundle branch block Left anterior fascicular block  Lexiscan myoview stress test 01/30/2018: 1. The resting electrocardiogram demonstrated normal sinus rhythm, LAD, LAFB, RBBB and no resting arrhythmias.  The stress electrocardiogram was non-diagnostic due to pharmacologic stress. Stress symptoms included dyspnea. Resting BP 166/86 and peak BP 202/88 mm Hg.  2. Myocardial perfusion imaging is normal. Overall left ventricular systolic function was normal without regional wall motion abnormalities. The left ventricular ejection fraction was 56%.  This is a low risk study.  Echocardiogram 01/28/2018:  Left ventricle  cavity is normal in size. Moderate concentric hypertrophy of the left ventricle. Normal global wall motion. Doppler evidence of grade I (impaired) diastolic dysfunction, normal LAP. Calculated EF 55%. Left atrial cavity is mildly dilated. Mild (Grade I) aortic regurgitation. Mild (Grade I) mitral regrgitation. Trace tricuspid regurgitation. Inadequate tricuspid regurgitation jet to estimate pulmonary artery pressure. Normal right atrial pressure.  Recent labs: 05/28/2019: Glucose 119, BUN/Cr 17/0.85. EGFR 66. Na/K 140/4.5.   10-02/2019: Glucose 96, BUN/Cr 20/0.74. EGFR 79. Na/K 139/4.4. Rest of the CMP normal H/H 12/36. MCV 91. Platelets 247 Chol 133, TG 119, HDL 38, LDL 71 TSH 0.9 normal   10/17/2018: Glucose 98, BUN/Cr 20/0.8. EGFR >60. Na/K 140/4.2. Rest of the CMP normal   Review of Systems  Cardiovascular: Negative for chest pain, dyspnea on exertion, leg swelling, palpitations and syncope.        Vitals:   12/08/19 2002  BP: 136/81  Pulse: 78  SpO2: 96%      Objective:   Physical Exam Vitals and nursing note reviewed.  Constitutional:      General: She is not in acute distress. Neck:     Vascular: No JVD.  Cardiovascular:     Rate and Rhythm: Normal rate and regular rhythm.     Pulses: Intact distal pulses.     Heart sounds: Normal heart sounds. No murmur heard.      Comments: Bilateral LE varcocities Pulmonary:     Effort: Pulmonary effort is normal.     Breath sounds: Normal breath sounds. No wheezing or rales.         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 No change made today. Conitnue valsartan-HCTZ 320-25 mg to 1 tab daily, amlodipine 5 mg daily.  Continue pharmacist guided remote patient monitoring.  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 Improved with compression stockings.  F/u in 3 months.   Nigel Mormon, MD [facility] Pager: [phone] Office: [phone] If no answer Cell [phone]

## 2019-12-15 ENCOUNTER — Ambulatory Visit: Payer: Medicare PPO | Admitting: General Practice

## 2019-12-15 ENCOUNTER — Other Ambulatory Visit: Payer: Self-pay

## 2019-12-15 DIAGNOSIS — Z7901 Long term (current) use of anticoagulants: Secondary | ICD-10-CM

## 2019-12-15 DIAGNOSIS — I2699 Other pulmonary embolism without acute cor pulmonale: Secondary | ICD-10-CM | POA: Diagnosis not present

## 2019-12-15 LAB — POCT INR: INR: 1.8 — AB (ref 2.0–3.0)

## 2019-12-15 NOTE — Patient Instructions (Signed)
Pre visit review using our clinic review tool, if applicable. No additional management support is needed unless otherwise documented below in the visit note.  Take 1 1/2 tablets today and and then continue to take 1 tablet daily except 1/2 tablet on Sunday, Tuesday and Thursdays.  Re-check in 3 weeks.

## 2019-12-16 ENCOUNTER — Telehealth: Payer: Self-pay | Admitting: Psychiatry

## 2019-12-16 ENCOUNTER — Other Ambulatory Visit: Payer: Self-pay

## 2019-12-16 MED ORDER — VORTIOXETINE HBR 20 MG PO TABS: 20.0000 mg | ORAL_TABLET | Freq: Every day | ORAL | 2 refills | Status: DC

## 2019-12-16 NOTE — Assessment & Plan Note (Signed)
Reviewed pt's medications and who is prescribing them w/ both pt and daughter.  We discussed at length who is responsible for prescribing each medication.  At this point, the only medication I can stop is Omeprazole as this is the only 'superfluous' medication I prescribe.  Encouraged her to speak w/ her other providers to see if there is a way to trim her med list.  Pt expressed understanding and is in agreement w/ plan.

## 2019-12-16 NOTE — Assessment & Plan Note (Signed)
Ongoing issue for pt.  She is following w/ Pyschiatry.  Daughter is not pleased w/ her current med regimen.  Encouraged them to schedule an appt and have the daughter accompany her so that possible changes could be made.

## 2019-12-16 NOTE — Assessment & Plan Note (Signed)
Check labs and replete prn.  Will also check a PTH and calcium level as daughter is worried about parathyroid disease.  Reviewed recent Vit D levels w/ family and reassured them that none were 'dangerously low'.  Will follow.

## 2019-12-16 NOTE — Telephone Encounter (Signed)
Rx sent to [facility]

## 2019-12-16 NOTE — Telephone Encounter (Signed)
Pt needs a script of the trintellix 20 mg to be sent to the pharmacy [facility] in [place]. She has been taking samples and now needs a prescription

## 2019-12-16 NOTE — Assessment & Plan Note (Signed)
Ongoing issue for pt.  Daughter would like B12 levels checked.  Will replete prn.

## 2019-12-17 ENCOUNTER — Telehealth: Payer: Self-pay | Admitting: Pharmacist

## 2019-12-17 ENCOUNTER — Telehealth: Payer: Self-pay | Admitting: Psychiatry

## 2019-12-17 ENCOUNTER — Ambulatory Visit (HOSPITAL_COMMUNITY): Payer: Medicare PPO | Admitting: Physical Therapy

## 2019-12-17 NOTE — Telephone Encounter (Signed)
Med list reviewed and updated 

## 2019-12-17 NOTE — Telephone Encounter (Signed)
Andrea Simmons called to report that just doesn't think she can handle to medication you're wanting her to take.  She said a lot is going on and she is at her wits ends.  Please call to discuss everything and review medications

## 2019-12-18 DIAGNOSIS — I10 Essential (primary) hypertension: Secondary | ICD-10-CM | POA: Diagnosis not present

## 2019-12-18 NOTE — Telephone Encounter (Signed)
Tried to reach [place] but it just kept ringing, will try again later

## 2019-12-19 ENCOUNTER — Encounter: Payer: Self-pay | Admitting: Family Medicine

## 2019-12-19 NOTE — Telephone Encounter (Signed)
Please try to reach her

## 2019-12-19 NOTE — Telephone Encounter (Signed)
Tried reaching patient. No answer and unable to leave a message.

## 2019-12-22 ENCOUNTER — Telehealth: Payer: Self-pay | Admitting: Family Medicine

## 2019-12-22 NOTE — Telephone Encounter (Signed)
If she is having depression symptoms, she would need an appointment to discuss with PCP.

## 2019-12-22 NOTE — Telephone Encounter (Signed)
Pt called in stating that she is going through depression, she states that hurts all the time, she states that she is not able to do much around the house. She states that she is in "bad shape". Pt can be reached at the home #    She wanted to know if she needs bw.

## 2019-12-22 NOTE — Telephone Encounter (Signed)
Pt is scheduled for a video visit 

## 2019-12-22 NOTE — Telephone Encounter (Signed)
Tried to reach patient again, phone keeps ringing but no answer

## 2019-12-23 ENCOUNTER — Encounter: Payer: Self-pay | Admitting: Family Medicine

## 2019-12-23 ENCOUNTER — Other Ambulatory Visit: Payer: Self-pay

## 2019-12-23 ENCOUNTER — Ambulatory Visit (HOSPITAL_COMMUNITY): Payer: Medicare PPO | Admitting: Physical Therapy

## 2019-12-23 ENCOUNTER — Telehealth (INDEPENDENT_AMBULATORY_CARE_PROVIDER_SITE_OTHER): Payer: Medicare PPO | Admitting: Family Medicine

## 2019-12-23 DIAGNOSIS — Z20822 Contact with and (suspected) exposure to covid-19: Secondary | ICD-10-CM | POA: Diagnosis not present

## 2019-12-23 DIAGNOSIS — F331 Major depressive disorder, recurrent, moderate: Secondary | ICD-10-CM | POA: Diagnosis not present

## 2019-12-23 NOTE — Progress Notes (Signed)
Virtual Visit via Video   I connected with patient on 12/23/19 at  1:30 PM EDT by a video enabled telemedicine application and verified that I am speaking with the correct person using two identifiers.  Location patient: Home Location provider: Acupuncturist, Office Persons participating in the virtual visit: Patient, Provider, [place] (Jess B)  I discussed the limitations of evaluation and management by telemedicine and the availability of in person appointments. The patient expressed understanding and agreed to proceed.  Subjective:   HPI:   [place] started ~1 week ago.  Pt reports 'I thought I was going to die'.  + fatigue- having a hard time getting out of bed.  + body aches- 'much much more intense than my usual pain'.  + HA- 'It was so tight I felt like it might explode'.  Denies chills.  + subjective low grade temps.  + nausea.  Decreased appetite.  Inability to taste.  Taste started to return yesterday.  'this is the sickest i've ever been in my life'.  Continues to feel 'pretty weak and exhausted'.    Depression- pt scored 17 on PHQ9.  Has psychiatrist and have discussed with family previously that any medication adjustments would need to come from Dr Clovis Pu or 1 of his colleagues.  She is in the process of adjusting medications but states that when she was feeling so poorly last week she called psych and did not hear back.  This is very upsetting to her.  She wants to find a new psychiatrist.  ROS:   See pertinent positives and negatives per HPI.  Patient Active Problem List   Diagnosis Date Noted  . Vitamin D deficiency 10/01/2019  . Polypharmacy 10/01/2019  . Major depressive disorder, recurrent episode, moderate ([place]) 10/01/2019  . Leg edema 06/04/2019  . Chest pain of uncertain etiology 04/54/0981  . Hx of pulmonary embolus 03/30/2017  . Physical exam 11/30/2016  . Hyperlipidemia 12/26/2013  . Encounter for therapeutic drug monitoring 04/16/2013  . Right  bundle branch block and left anterior fascicular block 11/27/2011  . Long term (current) use of anticoagulants 06/30/2010  . Exertional dyspnea 11/09/2008  . Diabetes type 2, controlled ([place]) 08/27/2008  . ANEMIA-NOS 08/27/2008  . Seasonal and perennial allergic rhinitis 08/27/2008  . PULMONARY NODULE 08/27/2008  . Fibromyalgia 08/27/2008  . 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 ([place]) 03/20/2007  . Hypothyroidism 03/20/2007  . Essential hypertension 03/20/2007  . Recurrent pulmonary emboli ([place]) 03/20/2007  . GERD 03/20/2007  . ESOPHAGEAL STRICTURE 03/08/2007    Social History   Tobacco Use  . Smoking status: Never Smoker  . Smokeless tobacco: Never Used  Substance Use Topics  . Alcohol use: No    Alcohol/week: 0.0 standard drinks    Current Outpatient Medications:  .  Accu-Chek Softclix Lancets lancets, Use as instructed to test sugars BID, Disp: 100 each, Rfl: 12 .  acetaminophen (TYLENOL) 500 MG tablet, Take 500 mg by mouth every 6 (six) hours as needed., Disp: , Rfl:  .  amLODipine (NORVASC) 10 MG tablet, Take 1 tablet (10 mg total) by mouth daily. (Patient taking differently: Take 10 mg by mouth daily. Taking it at night), Disp: 90 tablet, Rfl: 2 .  BIOTIN PO, Take by mouth. Luster & lum - Sheen, Disp: , Rfl:  .  Blood Glucose Monitoring Suppl (ACCU-CHEK GUIDE) w/Device KIT, 1 each by Does not apply route 2 (two) times daily. To test sugars. Dx. E11.9, Disp: 1 kit, Rfl: 1 .  fenofibrate (  TRICOR) 48 MG tablet, Take 1 tablet by mouth daily., Disp: , Rfl:  .  furosemide (LASIX) 20 MG tablet, TAKE 1 TABLET BY MOUTH AS NEEDED (Patient taking differently: Take 20 mg by mouth daily. Only takes as needed), Disp: 90 tablet, Rfl: 1 .  glucose blood (ACCU-CHEK GUIDE) test strip, CHECK BLOOD SUGARS DAILY, Disp: 100 strip, Rfl: 12 .  levothyroxine (SYNTHROID) 25 MCG tablet, TAKE 1 TABLET BY MOUTH DAILY BEFORE BREAKFAST., Disp: 90 tablet, Rfl: 1 .  Magnesium 500 MG TABS, Take by mouth., Disp:  , Rfl:  .  metFORMIN (GLUCOPHAGE-XR) 500 MG 24 hr tablet, Take 500 mg by mouth 2 (two) times daily. , Disp: , Rfl: 3 .  methylphenidate (RITALIN) 10 MG tablet, Take 1 tablet (10 mg total) by mouth 2 (two) times daily. (Patient taking differently: Take 5 mg by mouth 2 (two) times daily. ), Disp: 180 tablet, Rfl: 0 .  Omega-3 Fatty Acids (FISH OIL) 500 MG CAPS, Take 1 capsule by mouth 2 (two) times daily. , Disp: , Rfl:  .  RESTASIS MULTIDOSE 0.05 % ophthalmic emulsion, Place 1 drop into both eyes 2 (two) times daily. , Disp: , Rfl: 6 .  rosuvastatin (CRESTOR) 20 MG tablet, Take 20 mg by mouth daily. , Disp: , Rfl:  .  valsartan-hydrochlorothiazide (DIOVAN-HCT) 320-25 MG tablet, Take 1 tablet by mouth in the morning., Disp: 90 tablet, Rfl: 2 .  vortioxetine HBr (TRINTELLIX) 20 MG TABS tablet, Take 1 tablet (20 mg total) by mouth daily., Disp: 30 tablet, Rfl: 2 .  warfarin (COUMADIN) 5 MG tablet, 53m on Sunday& Thursday, 2.524mAll other days (Patient taking differently: 83m42mn Sunday, 2.83mg60ml other days), Disp: 90 tablet, Rfl: 1 .  nitroGLYCERIN (NITROSTAT) 0.4 MG SL tablet, Place 1 tablet (0.4 mg total) under the tongue every 5 (five) minutes as needed for chest pain. (Patient not taking: Reported on 12/23/2019), Disp: 30 tablet, Rfl: 3  Allergies  Allergen Reactions  . Anaprox [Naproxen Sodium]   . Lipitor [Atorvastatin]   . Morphine   . Meperidine Nausea And Vomiting  . Naproxen Sodium Nausea And Vomiting    Objective:   There were no vitals taken for this visit.  AAOx3, NAD, lying on bed NCAT, EOMI No obvious CN deficits Pale Pt is able to speak clearly, coherently without shortness of breath or increased work of breathing.  Thought process is tangential.  Mood is appropriate.   Assessment and Plan:   Major depressive disorder- pt is having a very hard time right now.  Indicates she is not willing or ready to give up but is definitely worried that she isn't ever going to feel  better.  She is very upset with her psychiatrist for not returning her call last week when she felt so poorly.  She says she wants to find a new psychiatrist but she is in a difficult position b/c they are adjusting her medication.  Encouraged her to continue her current meds until she is physically feeling better.  Suspected COVID- pt reports husband was very ill 1-2 weeks prior to her sxs starting.  She reports feeling 'like I was going to die' and 'that's the sickest i've ever been'.  Thankfully pt is fully vaccinated.  Given her nausea, HA, body aches, fatigue, and lost of taste/smell I strongly suspect she had COVID.  Got her scheduled for testing at [facility] tomorrow at 11:30am.  Pt is aware and agreeable to go.  Reviewed supportive care and red flags  that should prompt return.  Pt expressed understanding and is in agreement w/ plan.   Annye Asa, MD 12/23/2019

## 2019-12-23 NOTE — Progress Notes (Signed)
I have discussed the procedure for the virtual visit with the patient who has given consent to proceed with assessment and treatment.   Lurlene Ronda L Vernie Vinciguerra, CMA     

## 2019-12-24 DIAGNOSIS — Z20822 Contact with and (suspected) exposure to covid-19: Secondary | ICD-10-CM | POA: Diagnosis not present

## 2019-12-26 ENCOUNTER — Telehealth: Payer: Self-pay | Admitting: Family Medicine

## 2019-12-26 NOTE — Telephone Encounter (Signed)
Spoke with pt. She advised that her COVID test was negative.   She is still having extreme tiredness. She said that she is still very fatigued. She tried putting new sheets on the bed, which involved her going into the basement and it took her all day. She said that she just aches, her muscles, joints, eyes, and the top of her head. The "only thing that doesn't hurt is my toenails".    Pt kept repeating I don't know where to start. She does admit that she is having some crying fits, exhaustion and then other days she is fine.

## 2019-12-26 NOTE — Telephone Encounter (Signed)
No faxed results and they may not yet be available

## 2019-12-26 NOTE — Telephone Encounter (Signed)
Patients husband called and wanted to know if his wife's covid test results are in yet. Please advise

## 2019-12-26 NOTE — Telephone Encounter (Signed)
I am concerned that pt's depression is worsening- which is contributing to excessive fatigue, body aches, and tearfulness.  I know psych was adjusting her medication and she definitely needs to get in contact with either her old psychiatrist or consider [facility] or [place].

## 2019-12-26 NOTE — Telephone Encounter (Signed)
I do not see where we have received results electronically. Have you received anything through fax?

## 2019-12-26 NOTE — Telephone Encounter (Signed)
Called and spoke with pt. Spent 25 minutes on the phone. Ended up giving the pt the number for the [facility]. And the 24 hour number.

## 2019-12-29 ENCOUNTER — Other Ambulatory Visit: Payer: Self-pay

## 2019-12-29 ENCOUNTER — Ambulatory Visit (INDEPENDENT_AMBULATORY_CARE_PROVIDER_SITE_OTHER): Payer: Medicare PPO | Admitting: Psychiatry

## 2019-12-29 DIAGNOSIS — F331 Major depressive disorder, recurrent, moderate: Secondary | ICD-10-CM | POA: Diagnosis not present

## 2019-12-29 NOTE — Progress Notes (Signed)
[facility] Counsel[facility] Initial Adult Exam  Name: Andrea Simmons Date: 12/29/2019 MRN: [id number] DOB: [birth year] PCP: [name], MD  Time spent: 60 minutes  1:00pm to 2:00pm   Guardian/Payee:  patient    Paperwork requested:  No   Reason for Visit /Presenting Problem:  Depression, anxiety, sad, loss of interest in things I used to be interested in, tearfulness, angry at times, "not motivated" Mental Status Exam:   Appearance:   Well Groomed     Behavior:  Appropriate and Sharing  Motor:  uses th help of a cane  Speech/Language:   Clear and Coherent  Affect:  anxious, depressed, tearful  Mood:  anxious, depressed and sad  Thought process:  goal directed  Thought content:    WNL  Sensory/Perceptual disturbances:    WNL  Orientation:  oriented to person, place, time/date, situation, day of week, month of year and year  Attention:  Fair  Concentration:  Fair and Poor  Memory:  "definitely forgetful" and "I think it may be part of my meds."  Fund of knowledge:   Good  Insight:    Fair  Judgment:   Fair  Impulse Control:  Fair and Poor   Reported Symptoms:  See symptoms above.  Risk Assessment: Danger to Self:  No Self-injurious Behavior: No Danger to Others: No Duty to Warn:no Physical Aggression / Violence:No  Access to Firearms a concern: No  Gang Involvement:No  Patient / guardian was educated about steps to take if suicide or homicide risk level increases between visits: Patient denies any SI. While future psychiatric events cannot be accurately predicted, the patient does not currently require acute inpatient psychiatric care and does not currently meet [facility] involuntary commitment criteria.  Substance Abuse History: Current substance abuse: No     Past Psychiatric History:   Previous psychological history is significant for anxiety and depression Outpatient Providers: Dr. Clovis Pu and a therapist whose name is not recalled. History of Psych  Hospitalization: No  Psychological Testing: none   Abuse History: Victim of No., n/a   Report needed: No. Victim of Neglect:No. Perpetrator of n/a  Witness / Exposure to Domestic Violence: No   Protective Services Involvement: No  Witness to [employer] Violence:  No   Family History:  Patient confirms info below. Family History  Problem Relation Age of Onset  . Depression Other        Parent  . Arthritis Other        Parent, Grandparent  . Hypertension Other        Grandparent  . Hyperlipidemia Other        [place]  . Miscarriages / Stillbirths Other        Grandparent  . Stroke Other        [facility]  . Cancer Maternal Uncle        prostate  . Hypertension Maternal Grandfather   . Breast cancer Cousin   . Breast cancer Cousin     Living situation: the patient lives with their spouse; married for 72yr.  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 History of legal issue / charges: n/a  Medical History/Surgical History:  Patient confirms info below:  Past Medical History:  Diagnosis Date  . Anemia   . Anxiety   . Breast cancer ([place])   . Depression   . Diabetes  mellitus type II   . Fibromyalgia   . GERD (gastroesophageal reflux disease)   . Hyperlipidemia   . Hypertension   . Malignant neoplasm of breast (female), unspecified site 1993   L breast s/p mastectomy and tamoxifen x 3yr  . 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 Unspecified hypothyroidism     Past Surgical History:  Procedure Laterality Date  . APPENDECTOMY    . BREAST BIOPSY Left 1993  . BREAST BIOPSY Right 09/25/2014   stero. Benign  . BREAST RECONSTRUCTION Left   . CATARACT EXTRACTION    . MASTECTOMY Left   . ROTATOR CUFF REPAIR    . SPINAL FUSION      x 2  . TOTAL ABDOMINAL HYSTERECTOMY W/ BILATERAL SALPINGOOPHORECTOMY    . TUBAL LIGATION      Medications: Current Outpatient Medications  Medication Sig Dispense Refill  . Accu-Chek Softclix Lancets lancets Use as instructed to test sugars BID 100 each 12  . acetaminophen (TYLENOL) 500 MG tablet Take 500 mg by mouth every 6 (six) hours as needed.    .Marland KitchenamLODipine (NORVASC) 10 MG tablet Take 1 tablet (10 mg total) by mouth daily. (Patient taking differently: Take 10 mg by mouth daily. Taking it at night) 90 tablet 2  . BIOTIN PO Take by mouth. LProtection   . Blood Glucose Monitoring Suppl (ACCU-CHEK GUIDE) w/Device KIT 1 each by Does not apply route 2 (two) times daily. To test sugars. Dx. E11.9 1 kit 1  . fenofibrate (TRICOR) 48 MG tablet Take 1 tablet by mouth daily.    . furosemide (LASIX) 20 MG tablet TAKE 1 TABLET BY MOUTH AS NEEDED (Patient taking differently: Take 20 mg by mouth daily. Only takes as needed) 90 tablet 1  . glucose blood (ACCU-CHEK GUIDE) test strip CHECK BLOOD SUGARS DAILY 100 strip 12  . levothyroxine (SYNTHROID) 25 MCG tablet TAKE 1 TABLET BY MOUTH DAILY BEFORE BREAKFAST. 90 tablet 1  . Magnesium 500 MG TABS Take by mouth.    . metFORMIN (GLUCOPHAGE-XR) 500 MG 24 hr tablet Take 500 mg by mouth 2 (two) times daily.   3  . methylphenidate  (RITALIN) 10 MG tablet Take 1 tablet (10 mg total) by mouth 2 (two) times daily. (Patient taking differently: Take 5 mg by mouth 2 (two) times daily. ) 180 tablet 0  . nitroGLYCERIN (NITROSTAT) 0.4 MG SL tablet Place 1 tablet (0.4 mg total) under the tongue every 5 (five) minutes as needed for chest pain. (Patient not taking: Reported on 12/23/2019) 30 tablet 3  . Omega-3 Fatty Acids (FISH OIL) 500 MG CAPS Take 1 capsule by mouth 2 (two) times daily.     . RESTASIS MULTIDOSE 0.05 % ophthalmic emulsion Place 1 drop into both eyes  2 (two) times daily.   6  . rosuvastatin (CRESTOR) 20 MG tablet Take 20 mg by mouth daily.     . valsartan-hydrochlorothiazide (DIOVAN-HCT) 320-25 MG tablet Take 1 tablet by mouth in the morning. 90 tablet 2  . vortioxetine HBr (TRINTELLIX) 20 MG TABS tablet Take 1 tablet (20 mg total) by mouth daily. 30 tablet 2  . warfarin (COUMADIN) 5 MG tablet 60m on Sunday& Thursday, 2.560mAll other days (Patient taking differently: 60m59mn Sunday, 2.60mg60ml other days) 90 tablet 1   No current facility-administered medications for this visit.    Allergies  Allergen Reactions  . Anaprox [Naproxen Sodium]   . Lipitor [Atorvastatin]   . Morphine   . Meperidine Nausea And Vomiting  . Naproxen Sodium Nausea And Vomiting    Diagnoses:    ICD-10-CM   1. Major depressive disorder, recurrent episode, moderate (H[facility])[place]33.1     Plan of Care:  Patient not signing treatment plan on computer screen due to Covid.  Treatment Goals: Goals remain on tx plan as patient works with strategies to achieve her goals.  Long term goal: Develop healthy cognitive beliefs about self and the world that lead to alleviation of depression and help prevent relapse of depression.  Short term goal: Identify and replace depressive thinking that leads to depressive feelings and actions.  Strategies: Replace negative self-defeating self-talk with verbalization of realistic and positive messages.    Progress: Today is patient's initial therapy session and we completed her initial evaluation. Also completed her initial treatment goal plan based on her needs and symptoms.  Patient is a 80yr33yrfemale, married for 57 yr63 with 1 daughter (agd 55) a22 1 grandson (age 69). 35story of being hairdresser, and also a teacher's asst.  Husband worked at a bre[employer]omatic Data also has some "not life threatening"  health problems and is 84 yr79old. Both patient and husband still are able to drive. No history of any substance abuse. Today is very consumed with getting old. Still has several good friends "in the community", but a lot of our previous friends have died. Has history of being on meds and seeing another therapist previously.  Goal review with patient and she agrees they appropriate for her needs at this time.  Next appt within 3 weeks.    DeborShanon AceW

## 2020-01-05 ENCOUNTER — Ambulatory Visit (INDEPENDENT_AMBULATORY_CARE_PROVIDER_SITE_OTHER): Payer: Medicare PPO | Admitting: General Practice

## 2020-01-05 ENCOUNTER — Other Ambulatory Visit: Payer: Self-pay

## 2020-01-05 DIAGNOSIS — I2699 Other pulmonary embolism without acute cor pulmonale: Secondary | ICD-10-CM

## 2020-01-05 DIAGNOSIS — Z7901 Long term (current) use of anticoagulants: Secondary | ICD-10-CM

## 2020-01-05 LAB — POCT INR: INR: 2 (ref 2.0–3.0)

## 2020-01-05 NOTE — Patient Instructions (Signed)
Pre visit review using our clinic review tool, if applicable. No additional management support is needed unless otherwise documented below in the visit note.  Change dosage and take 1 tablet daily except 1/2 tablet on Sunday and Thursdays.  Re-check in 4 weeks.

## 2020-01-07 ENCOUNTER — Ambulatory Visit (HOSPITAL_COMMUNITY): Payer: Medicare PPO | Attending: Neurology | Admitting: Physical Therapy

## 2020-01-07 ENCOUNTER — Other Ambulatory Visit: Payer: Self-pay

## 2020-01-07 ENCOUNTER — Encounter (HOSPITAL_COMMUNITY): Payer: Self-pay | Admitting: Physical Therapy

## 2020-01-07 DIAGNOSIS — R2689 Other abnormalities of gait and mobility: Secondary | ICD-10-CM | POA: Diagnosis not present

## 2020-01-07 DIAGNOSIS — M6281 Muscle weakness (generalized): Secondary | ICD-10-CM | POA: Diagnosis not present

## 2020-01-07 DIAGNOSIS — R29898 Other symptoms and signs involving the musculoskeletal system: Secondary | ICD-10-CM
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
## 2020-01-12 ENCOUNTER — Encounter (HOSPITAL_COMMUNITY): Payer: Self-pay | Admitting: Physical Therapy

## 2020-01-12 ENCOUNTER — Ambulatory Visit (HOSPITAL_COMMUNITY): Payer: Medicare PPO | Admitting: Physical Therapy

## 2020-01-12 ENCOUNTER — Other Ambulatory Visit: Payer: Self-pay

## 2020-01-12 DIAGNOSIS — R2689 Other abnormalities of gait and mobility: Secondary | ICD-10-CM

## 2020-01-12 DIAGNOSIS — R29898 Other symptoms and signs involving the musculoskeletal system: Secondary | ICD-10-CM | POA: Diagnosis not present

## 2020-01-12 DIAGNOSIS — M6281 Muscle weakness (generalized): Secondary | ICD-10-CM

## 2020-01-12 NOTE — Patient Instructions (Signed)
Access Code: [email or web address] URL: [email or web address] Date: 01/12/2020 Prepared by: Mitzi Hansen Cooper Stamp  Exercises Heel rises with counter support - 1 x daily - 7 x weekly - 1 sets - 15 reps Standing March with Counter Support - 1 x daily - 7 x weekly - 2 sets - 10 reps Sit to Stand with Arms Crossed - 1 x daily - 7 x weekly - 2 sets - 10 reps
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
## 2020-01-14 ENCOUNTER — Other Ambulatory Visit: Payer: Self-pay

## 2020-01-14 ENCOUNTER — Encounter: Payer: Self-pay | Admitting: Psychiatry

## 2020-01-14 ENCOUNTER — Ambulatory Visit (INDEPENDENT_AMBULATORY_CARE_PROVIDER_SITE_OTHER): Payer: Medicare PPO | Admitting: Psychiatry

## 2020-01-14 DIAGNOSIS — F4001 Agoraphobia with panic disorder: Secondary | ICD-10-CM

## 2020-01-14 DIAGNOSIS — F338 Other recurrent depressive disorders: Secondary | ICD-10-CM

## 2020-01-14 DIAGNOSIS — F411 Generalized anxiety disorder: Secondary | ICD-10-CM

## 2020-01-14 DIAGNOSIS — R7989 Other specified abnormal findings of blood chemistry: Secondary | ICD-10-CM | POA: Diagnosis not present

## 2020-01-14 DIAGNOSIS — F331 Major depressive disorder, recurrent, moderate: Secondary | ICD-10-CM | POA: Diagnosis not present

## 2020-01-14 MED ORDER — VILAZODONE HCL 20 MG PO TABS: 20.0000 mg | ORAL_TABLET | Freq: Every day | ORAL | 1 refills | Status: DC

## 2020-01-14 NOTE — Patient Instructions (Addendum)
Reduce Trintellix to 10 mg 1 daily and start viibryd 10 mg tablet 1 daily for a week, then  Stop Trintellix and increase Viibryd to 1 of the 20 mg daily with food.

## 2020-01-14 NOTE — Progress Notes (Signed)
Andrea Simmons [id number] 1941-01-22 79 y.o.  Subjective:   Patient ID:  Andrea Simmons is a 79 y.o. (DOB 06/08/1940) female.  Chief Complaint:  Chief Complaint  Patient presents with  . Follow-up  . Depression  . Anxiety    Depression        Associated symptoms include no decreased concentration and no suicidal ideas.   Andrea Simmons presents  today for recent exacerbation of depression..    At visit was July 09, 2018.  Vraylar was discontinued for lack of response.  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  This was partially helpful.  When  seen Aug 15, 2018 and Ritalin was increased to 15 mg twice daily because of partial response at the lower dose.  At visit and of July 2020.  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  The Ritalin was increased again to 20 mg twice daily.  seen December 04, 2018 & 02/2019.  Doesn't like winter.  Seasonal depression and crying more.  There were no med changes.   11/17/19 appt with the following noted: BP been higher and seeing Card and having med changes. Stopped Vit D bc level was high. Some discussion about the Ritalin over the BP control. Some increase depression in part over health issues.  No marital problems.  No SI.  Issues with daughter are a stressor.  D said she was mean when D was growing up and was too strict.  Felt D was ungrateful and that she and H did the best they could do.  I can't get over that and has told D about this. Ritalin with less energizing benefit and less productive than she was..  Wears off around supper time.  At night cannot break habit of snacks at night. Does not feel it kick in and wear off.  Back to old habits of staying up too late, now MN.  Needs 7-8 hours.  Always needed more than others.    I feel good taking it.  Most days feels pretty good.  Can have crying  spells without reason even before the Ritalin.   Doing much more than I was.   H still sick often too. Plan: Reduce Ritalin to 10 mg twice daily due to loss of benefit and blood pressure  Start change from duloxetine to Trintellix to help depression: Reduce duloxetine to 3 of 30 mg capsules and start Trintellix 5 mg daily for 1 week, Then reduce duloxetine to 2 of the 30 mg capsules and increase Trintellix to 10 mg daily for 1 week, Then increase Trintellix to 10 + 5 mg tablets and reduce duloxetine to 1 of the 30 mg capsules for 1 week, Then stop Duloxetine and increase Trintellix to 20 mg daily (or 2 of the 10 mg tablets)  01/14/2020 appointment with the following noted: Is on Trintellix 20 mg daily since 12/08/19 approximately. Was really hard with the switch.  Got really low and now some better.  Had a lot of aches and fatigue for awhile for 5 weeks.  Wonders if she was sick.  She doesn't feel like Trintellix is the right med. Gained 14# and still on low dose Ritalin.  Still having crying spells and poor energy but some days feels pretty good. No nausea or other SE noticed. A lot of worry over her health and  BP and H's cellulitis again.   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   Past Psychiatric Medication Trials: Rozerem, zolpidem. Duloxetine 120, Paxil 20 for years, Sertraline, fluoxetine, Wellbutrin,  Trintellix 20 5 weeks NR. Abilify weight gain and loss benefit, Was More talkative on the Abilify and the Wellbutrin. lithium 2004 SE tremor at 634m daily.  Low dose nortriptyline, Vraylar NR, Buspar NR.  Review of Systems:  Review of Systems  Constitutional: Positive for unexpected weight change.  Cardiovascular: Negative for palpitations.  Musculoskeletal: Positive for arthralgias, back pain and joint swelling. Negative for neck stiffness.  Neurological: Negative for tremors, weakness and numbness.  Psychiatric/Behavioral: Positive for depression and dysphoric  mood. Negative for agitation, behavioral problems, confusion, decreased concentration, hallucinations, self-injury, sleep disturbance and suicidal ideas. The patient is nervous/anxious. The patient is not hyperactive.    Hx anemia.    Cardiologist said she had a stiff heart.  History of pulmonary embolism and on Coumadin.  Medications: I have reviewed the patient's current medications.  Current Outpatient Medications  Medication Sig Dispense Refill  . Accu-Chek Softclix Lancets lancets Use as instructed to test sugars BID 100 each 12  . acetaminophen (TYLENOL) 500 MG tablet Take 500 mg by mouth every 6 (six) hours as needed.    .Marland KitchenamLODipine (NORVASC) 10 MG tablet Take 1 tablet (10 mg total) by mouth daily. (Patient taking differently: Take 10 mg by mouth daily. Taking it at night) 90 tablet 2  . BIOTIN PO Take by mouth. LEvergreen   . Blood Glucose Monitoring Suppl (ACCU-CHEK GUIDE) w/Device KIT 1 each by Does not apply route 2 (two) times daily. To test sugars. Dx. E11.9 1 kit 1  . fenofibrate (TRICOR) 48 MG tablet Take 1 tablet by mouth daily.    . furosemide (LASIX) 20 MG tablet TAKE 1 TABLET BY MOUTH AS NEEDED (Patient taking differently: Take 20 mg by mouth daily. Only takes as needed) 90 tablet 1  . glucose blood (ACCU-CHEK GUIDE) test strip CHECK BLOOD SUGARS DAILY 100 strip 12  . levothyroxine (SYNTHROID) 25 MCG tablet TAKE 1 TABLET BY MOUTH DAILY BEFORE BREAKFAST. 90 tablet 1  . Magnesium 500 MG TABS Take by mouth.    . metFORMIN (GLUCOPHAGE-XR) 500 MG 24 hr tablet Take 500 mg by mouth 2 (two) times daily.   3  . methylphenidate (RITALIN) 10 MG tablet Take 1 tablet (10 mg total) by mouth 2 (two) times daily. (Patient taking differently: Take 5 mg by mouth 2 (two) times daily. ) 180 tablet 0  . nitroGLYCERIN (NITROSTAT) 0.4 MG SL tablet Place 1 tablet (0.4 mg total) under the tongue every 5 (five) minutes as needed for chest pain. 30 tablet 3  . Omega-3 Fatty Acids (FISH  OIL) 500 MG CAPS Take 1 capsule by mouth 2 (two) times daily.     . RESTASIS MULTIDOSE 0.05 % ophthalmic emulsion Place 1 drop into both eyes 2 (two) times daily.   6  . rosuvastatin (CRESTOR) 20 MG tablet Take 20 mg by mouth daily.     . valsartan-hydrochlorothiazide (DIOVAN-HCT) 320-25 MG tablet Take 1 tablet by mouth in the morning. 90 tablet 2  . vortioxetine HBr (TRINTELLIX) 20 MG TABS tablet Take 1 tablet (20 mg total) by mouth daily. 30 tablet 2  . warfarin (COUMADIN) 5 MG tablet 516mon Sunday& Thursday, 2.28m51mll other days (Patient taking differently: 28mg29m Sunday, 2.28mg 38m other days) 90 tablet 1  No current facility-administered medications for this visit.    Medication Side Effects: None talkative more the [facility]  Allergies:  Allergies  Allergen Reactions  . Anaprox [Naproxen Sodium]   . Lipitor [Atorvastatin]   . Morphine   . Meperidine Nausea And Vomiting  . Naproxen Sodium Nausea And Vomiting    Past Medical History:  Diagnosis Date  . Anemia   . Anxiety   . Breast cancer ([place])   . Depression   . Diabetes mellitus type II   . Fibromyalgia   . GERD (gastroesophageal reflux disease)   . Hyperlipidemia   . Hypertension   . Malignant neoplasm of breast (female), unspecified site 1993   L breast s/p mastectomy and tamoxifen x 62yr  . 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 Unspecified hypothyroidism     Family History  Problem Relation Age of Onset  . Depression Other        Parent  . Arthritis Other        Parent, Grandparent  . Hypertension Other        Grandparent  . Hyperlipidemia Other        [place] . Miscarriages / Stillbirths Other        Grandparent  . Stroke Other        [facility] Maternal Uncle        prostate  . Hypertension Maternal Grandfather   . Breast cancer Cousin   . Breast cancer Cousin     Social History   Socioeconomic History  . Marital status: Married    Spouse name: Not on  file  . Number of children: 1  . Years of education: Not on file  . Highest education level: Not on file  Occupational History  . Occupation: tArmed forces technical officer RETIRED  . 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 Smoking status: Never Smoker  . Smokeless tobacco: Never Used  Vaping Use  . Vaping Use: Never used  Substance and Sexual Activity  . Alcohol use: No    Alcohol/week: 0.0 standard drinks  . Drug use: No  . Sexual activity: Not on file  Other Topics Concern  . Not on file  Social History Narrative  . Not on file   Social Determinants of Health   Financial Resource Strain:   . Difficulty of Paying Living Expenses: Not on file  Food Insecurity:   . Worried About RCharity fundraiserin the Last Year: Not on file  . Ran Out of Food in the Last Year: Not on file  Transportation Needs:   . Lack of Transportation (Medical): Not on file  . Lack of Transportation (Non-Medical): Not on file  Physical Activity:   . Days of Exercise per Week: Not on file  . Minutes of Exercise per Session: Not on file  Stress:   . Feeling of Stress : Not on file  Social Connections:   . Frequency of Communication with Friends and Family: Not on file  . Frequency of Social Gatherings with Friends and Family: Not on file  . Attends Religious Services: Not on file  . Active Member of Clubs or Organizations: Not on file  . Attends CArchivistMeetings: Not on file  . Marital Status: Not on file  Intimate Partner Violence:   . Fear of Current or Ex-Partner: Not on file  . Emotionally  Abused: Not on file  . Physically Abused: Not on file  . Sexually Abused: Not on file    Past Medical History, Surgical history, Social history, and Family history were reviewed and updated as appropriate.   Please see review of systems for further details on the patient's review from today.   Objective:   Physical Exam:  There were no vitals taken  for this visit.  Physical Exam Constitutional:      General: She is not in acute distress.    Appearance: She is well-developed.  Musculoskeletal:        General: No deformity.  Neurological:     Mental Status: She is alert and oriented to person, place, and time.     Cranial Nerves: No dysarthria.     Coordination: Coordination normal.  Psychiatric:        Attention and Perception: Attention normal. She is attentive.        Mood and Affect: Mood is anxious and depressed. Affect is tearful. Affect is not labile, blunt, angry or inappropriate.        Speech: Speech normal.        Behavior: Behavior normal. Behavior is cooperative.        Thought Content: Thought content normal. Thought content is not paranoid or delusional. Thought content does not include homicidal or suicidal ideation. Thought content does not include homicidal or suicidal plan.        Cognition and Memory: Cognition and memory normal.        Judgment: Judgment normal.     Comments: Insight fair-good.  Worse mood probably related to problems with daughter. Talkative     Lab Review:     Component Value Date/Time   NA 140 10/01/2019 1030   NA 140 05/28/2019 1609   NA 141 08/01/2016 1527   K 4.0 10/01/2019 1030   K 4.6 08/01/2016 1527   CL 106 10/01/2019 1030   CO2 25 10/01/2019 1030   CO2 24 08/01/2016 1527   GLUCOSE 97 10/01/2019 1030   GLUCOSE 93 08/01/2016 1527   BUN 25 (H) 10/01/2019 1030   BUN 17 05/28/2019 1609   BUN 19.9 08/01/2016 1527   CREATININE 0.90 10/01/2019 1030   CREATININE 0.9 08/01/2016 1527   CALCIUM 9.7 10/01/2019 1030   CALCIUM 9.7 10/01/2019 1030   CALCIUM 9.7 08/01/2016 1527   PROT 6.6 09/10/2019 1113   PROT 7.2 08/01/2016 1527   PROT 6.8 08/01/2016 1527   ALBUMIN 4.4 09/10/2019 1113   ALBUMIN 4.0 08/01/2016 1527   AST 40 (H) 09/10/2019 1113   AST 41 (H) 08/01/2016 1527   ALT 25 09/10/2019 1113   ALT 25 08/01/2016 1527   ALKPHOS 43 09/10/2019 1113   ALKPHOS 48 08/01/2016  1527   BILITOT 0.6 09/10/2019 1113   BILITOT 0.33 08/01/2016 1527   GFRNONAA 66 05/28/2019 1609   GFRAA 76 05/28/2019 1609       Component Value Date/Time   WBC 8.9 08/06/2019 1356   RBC 4.04 08/06/2019 1356   HGB 12.7 08/06/2019 1356   HGB 9.5 (L) 08/01/2016 1527   HCT 37.8 08/06/2019 1356   HCT 31.2 (L) 08/01/2016 1527   PLT 257.0 Repeated and verified X2. 08/06/2019 1356   PLT 221 08/01/2016 1527   MCV 93.6 08/06/2019 1356   MCV 83.9 08/01/2016 1527   MCH 30.4 10/17/2018 1204   MCHC 33.6 08/06/2019 1356   RDW 13.8 08/06/2019 1356   RDW 17.2 (H) 08/01/2016 1527   LYMPHSABS  1.4 08/06/2019 1356   LYMPHSABS 2.0 08/01/2016 1527   MONOABS 0.7 08/06/2019 1356   MONOABS 0.4 08/01/2016 1527   EOSABS 0.0 08/06/2019 1356   EOSABS 0.1 08/01/2016 1527   BASOSABS 0.0 08/06/2019 1356   BASOSABS 0.0 08/01/2016 1527    No results found for: POCLITH, LITHIUM   No results found for: PHENYTOIN, PHENOBARB, VALPROATE, CBMZ   Endo checked thyroid lately.   Vitamin D level on April 01, 2018 reported as 40.  After that vitamin D 50,000 units weekly was prescribed with a goal of achieving levels in the 50s and 60s.  December 04, 2018 vitamin D 25 off supplement .res Assessment: Plan:    Andrea Simmons was seen today for follow-up, depression and anxiety.  Diagnoses and all orders for this visit:  Major depressive disorder, recurrent episode, moderate ([facility])  Seasonal depression ([facility])  Panic disorder with agoraphobia  Generalized anxiety disorder  Low vitamin D level     Greater than 50% of  30 min face to face time with patient was spent on counseling and coordination of care. We discussed several things.  She is not 100% free of depression she has residual low energy and motivation but her mood is substantially better than it was before. Clear benefit with Ritalin.  She is overall satisfied with the medication.  Discussed potential benefits, risks, and side effects of stimulants  with patient to include increased heart rate, palpitations, insomnia, increased anxiety, increased irritability, or decreased appetite.  Instructed patient to contact office if experiencing any significant tolerability issues. She does not appear manic and not sig different from the last visit. Still talkative on it but not manic.  Options pramipexole, Rexulti, Viibryd.  Disc SE and options.    Continue Ritalin to 10 mg twice daily due to loss of benefit and blood pressure  Poor response so far with Trintellix but it might get better with time.  She has a good bit of anxiety and may need a med with better anxiety efffects.  Option counseling but availability is limited for options. But on list for [facility]  Repeat vitamin D level.   Been off supplement since July 2021  Overall patient has not improved with switch from duloxetine to Trintellix.  Her energy is not better and her mood is not better.  She is also quite anxious. Reduce Trintellix to 10 mg 1 daily and start viibryd 10 mg daily for a week, then  Stop Trintellix and increase Viibryd to 20 mg daily with food. Disc SE  FU 6 weeks  Lynder Parents, MD, DFAPA   Please see After Visit Summary for patient specific instructions.  Future Appointments  Date Time Provider [place]  01/20/2020  1:00 PM Shanon Ace, LCSW [facility] None  01/26/2020 12:30 PM [name], MD [facility]  01/27/2020  2:00 PM Zaunegger, Vianne Bulls, PT [facility] None  01/29/2020  3:30 PM Susy Frizzle, PTA [facility] None  02/02/2020  1:15 PM [facility] COUMADIN [facility]  02/03/2020  1:15 PM Zaunegger, Vianne Bulls, PT [facility] None  02/05/2020  2:00 PM Susy Frizzle, PTA [facility] None  02/10/2020  3:00 PM Shanon Ace, LCSW [facility] None  02/11/2020  1:15 PM Zaunegger, Vianne Bulls, PT [facility] None  06/11/2020  1:30 PM [name], Reynold Bowen, MD [facility] None    No orders of the defined types were placed in this encounter.      -------------------------------

## 2020-01-15 ENCOUNTER — Telehealth (HOSPITAL_COMMUNITY): Payer: Self-pay | Admitting: Physical Therapy

## 2020-01-15 NOTE — Telephone Encounter (Signed)
pt cancelled appt for 10/29 because she has dizziness

## 2020-01-16 ENCOUNTER — Encounter (HOSPITAL_COMMUNITY): Payer: Medicare PPO | Admitting: Physical Therapy

## 2020-01-18 DIAGNOSIS — I10 Essential (primary) hypertension: Secondary | ICD-10-CM | POA: Diagnosis not present

## 2020-01-20 ENCOUNTER — Other Ambulatory Visit: Payer: Self-pay | Admitting: Psychiatry

## 2020-01-20 ENCOUNTER — Other Ambulatory Visit: Payer: Self-pay

## 2020-01-20 ENCOUNTER — Ambulatory Visit (INDEPENDENT_AMBULATORY_CARE_PROVIDER_SITE_OTHER): Payer: Medicare PPO | Admitting: Psychiatry

## 2020-01-20 DIAGNOSIS — F331 Major depressive disorder, recurrent, moderate: Secondary | ICD-10-CM | POA: Diagnosis not present

## 2020-01-20 DIAGNOSIS — F338 Other recurrent depressive disorders: Secondary | ICD-10-CM | POA: Diagnosis not present

## 2020-01-20 DIAGNOSIS — R7989 Other specified abnormal findings of blood chemistry: Secondary | ICD-10-CM | POA: Diagnosis not present

## 2020-01-20 NOTE — Progress Notes (Signed)
[facility] Counselor/Therapist Progress Note  Patient ID: Andrea Simmons, MRN: [id number],    Date: 01/20/2020  Time Spent: 50 minutes  1:00pm to 1:50pm    Treatment Type: Individual Therapy  Reported Symptoms: depression, anxiety, "some better days and some not so good days with my depression"  Mental Status Exam:  Appearance:   Well Groomed     Behavior:  Appropriate, Sharing and Motivated  Motor:  Normal  Speech/Language:   Clear and Coherent  Affect:  depressed, anxious  Mood:  anxious and depressed  Thought process:  normal  Thought content:    WNL  Sensory/Perceptual disturbances:    WNL  Orientation:  oriented to person, place, time/date, situation, day of week, month of year and year  Attention:  Good  Concentration:  Fair  Memory:  "sometimes forgetful and sometimes not"  Fund of knowledge:   Good  Insight:    Good and Fair  Judgment:   Good  Impulse Control:  Good   Risk Assessment: Danger to Self:  No Self-injurious Behavior: No Danger to Others: No Duty to Warn:no Physical Aggression / Violence:No  Access to Firearms a concern: No  Gang Involvement:No   Subjective: Patient today reports depression and anxiety.  States she feels she is managing some better, and on a 1-10 scale "maybe a 4 or 5".  Some days I feel like I'm normal and not too depressed and other days when I am depressed, probably more days that I am depressed but not as bad.   Interventions: Solution-Oriented/Positive Psychology and Ego-Supportive  Diagnosis:   ICD-10-CM   1. Major depressive disorder, recurrent episode, moderate ([place])  F33.1     Plan of Care:  Patient not signing treatment plan on computer screen due to Covid.  Treatment Goals: Goals remain on tx plan as patient works with strategies to achieve her goals.  Long term goal: Develop healthy cognitive beliefs about self and the world that lead to alleviation of depression and help prevent relapse of  depression.  Short term goal: Identify and replace depressive thinking that leads to depressive feelings and actions.  Strategies: Replace negative self-defeating self-talk with verbalization of realistic and positive messages.   Progress: Patient in today reporting anxiety and depression, with depression being the main symptom, "but it is some better." On "a more depressed day for me, I think about: being older, health concerns, fear something happening to me or my husband."  Admits she "tends to think more negatively on a worse day" and "can be more positive on a better day." Shares that family is very important to her. Over recent years "we've lost a number of friends who died."  "Worries about her age, explaining that she's concerned that eventually I might not be able to take care of my self, and worries about having more aches and pains that are age-related."  States that seasonal changes seem to impact her mood as she describes how" things sometime are harder in the darker times of winter."  Encouraged her to use more lights in her home, which she says she does alre "I'm thankful to be doing things I enjoy at home and work with my flowers, etc. Encouraged patient in session, using her short term goal and strategy in Tx Plan above, to more quickly identify and replace depressive and anxious thinking that tends to feed her depression, which she reports is stronger than her anxiety.  Mood seems some brighter today as she appropriately smiled  and laughed several times. To continue    Goal review and progress/challenges noted with patient.  Next appt. Within 3-4 weeks.   Shanon Ace, LCSW

## 2020-01-21 ENCOUNTER — Encounter (HOSPITAL_COMMUNITY): Payer: Medicare PPO

## 2020-01-21 ENCOUNTER — Encounter (HOSPITAL_COMMUNITY): Payer: Self-pay | Admitting: Physical Therapy

## 2020-01-21 ENCOUNTER — Ambulatory Visit (HOSPITAL_COMMUNITY): Payer: Medicare PPO | Attending: Neurology | Admitting: Physical Therapy

## 2020-01-21 DIAGNOSIS — R29898 Other symptoms and signs involving the musculoskeletal system: Secondary | ICD-10-CM

## 2020-01-21 DIAGNOSIS — M6281 Muscle weakness (generalized): Secondary | ICD-10-CM | POA: Insufficient documentation

## 2020-01-21 DIAGNOSIS — R2689 Other abnormalities of gait and mobility: Secondary | ICD-10-CM

## 2020-01-21 LAB — VITAMIN D 25 HYDROXY (VIT D DEFICIENCY, FRACTURES): Vit D, 25-Hydroxy: 34.6 ng/mL (ref 30.0–100.0)
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
## 2020-01-22 NOTE — Progress Notes (Signed)
Patient had a high vitamin D level on supplementation previously but has been off supplementation since July and now her level is 61 which is lower than desired.  Have patient restart vitamin D supplementation 2000 units daily.  She can get that over-the-counter.

## 2020-01-23 ENCOUNTER — Telehealth: Payer: Self-pay | Admitting: Psychiatry

## 2020-01-23 NOTE — Telephone Encounter (Signed)
Please review

## 2020-01-23 NOTE — Telephone Encounter (Signed)
Rtc to patient and she had tapered down to only 1/2 tablet of Ritalin 10 mg for 7 days. She didn't take any today. Advised her she may have some sleepiness but since she tapered down slowly she may not notice much.  Instructed her to see how she's feeling in about 7-14 days and if she notices a significant change with worsening depression, energy, or focus in stopping it, she could restart it. She agreed and will call back if needing anything else or has concerns.

## 2020-01-23 NOTE — Telephone Encounter (Signed)
If the Ritalin is no longer helping her energy, depression or focus she can stop it.  Do not be surprised if she feels a little more lethargic or sleepy for a few days after stopping it that would be normal and would resolve.

## 2020-01-23 NOTE — Telephone Encounter (Signed)
Pt would like to talk about changing her rx for ritalin. Pt has already cut back but wants to know if she can stop it completely. Please call.

## 2020-01-26 ENCOUNTER — Ambulatory Visit (INDEPENDENT_AMBULATORY_CARE_PROVIDER_SITE_OTHER): Payer: Medicare PPO | Admitting: Family Medicine

## 2020-01-26 ENCOUNTER — Encounter: Payer: Self-pay | Admitting: Family Medicine

## 2020-01-26 ENCOUNTER — Other Ambulatory Visit: Payer: Self-pay

## 2020-01-26 VITALS — BP 130/72 | HR 66 | Temp 98.0°F | Resp 19 | Ht 62.0 in | Wt 139.4 lb

## 2020-01-26 DIAGNOSIS — Z0000 Encounter for general adult medical examination without abnormal findings: Secondary | ICD-10-CM

## 2020-01-26 DIAGNOSIS — C50912 Malignant neoplasm of unspecified site of left female breast: Secondary | ICD-10-CM

## 2020-01-26 DIAGNOSIS — E559 Vitamin D deficiency, unspecified: Secondary | ICD-10-CM

## 2020-01-26 DIAGNOSIS — E119 Type 2 diabetes mellitus without complications: Secondary | ICD-10-CM

## 2020-01-26 DIAGNOSIS — Z23 Encounter for immunization: Secondary | ICD-10-CM

## 2020-01-26 LAB — BASIC METABOLIC PANEL
BUN: 25 mg/dL — ABNORMAL HIGH (ref 6–23)
CO2: 26 mEq/L (ref 19–32)
Calcium: 9.5 mg/dL (ref 8.4–10.5)
Chloride: 105 mEq/L (ref 96–112)
Creatinine, Ser: 0.93 mg/dL (ref 0.40–1.20)
GFR: 59.17 mL/min — ABNORMAL LOW (ref >60.00)
Glucose, Bld: 99 mg/dL (ref 70–99)
Potassium: 4.3 mEq/L (ref 3.5–5.1)
Sodium: 139 mEq/L (ref 135–145)

## 2020-01-26 LAB — CBC WITH DIFFERENTIAL/PLATELET
Basophils Absolute: 0 10*3/uL (ref 0.0–0.1)
Basophils Relative: 0.7 % (ref 0.0–3.0)
Eosinophils Absolute: 0.1 10*3/uL (ref 0.0–0.7)
Eosinophils Relative: 1.2 % (ref 0.0–5.0)
HCT: 37.2 % (ref 36.0–46.0)
Hemoglobin: 12.5 g/dL (ref 12.0–15.0)
Lymphocytes Relative: 29.9 % (ref 12.0–46.0)
Lymphs Abs: 1.5 10*3/uL (ref 0.7–4.0)
MCHC: 33.7 g/dL (ref 30.0–36.0)
MCV: 92.4 fl (ref 78.0–100.0)
Monocytes Absolute: 0.4 10*3/uL (ref 0.1–1.0)
Monocytes Relative: 8.3 % (ref 3.0–12.0)
Neutro Abs: 3.1 10*3/uL (ref 1.4–7.7)
Neutrophils Relative %: 59.9 % (ref 43.0–77.0)
Platelets: 195 10*3/uL (ref 150.0–400.0)
RBC: 4.03 Mil/uL (ref 3.87–5.11)
RDW: 13.2 % (ref 11.5–15.5)
WBC: 5.1 10*3/uL (ref 4.0–10.5)

## 2020-01-26 LAB — LIPID PANEL
Cholesterol: 140 mg/dL (ref 0–200)
HDL: 51.9 mg/dL (ref >39.00)
LDL Cholesterol: 73 mg/dL (ref 0–99)
NonHDL: 88.4
Total CHOL/HDL Ratio: 3
Triglycerides: 77 mg/dL (ref 0.0–149.0)
VLDL: 15.4 mg/dL (ref 0.0–40.0)

## 2020-01-26 LAB — HEPATIC FUNCTION PANEL
ALT: 30 U/L (ref 0–35)
AST: 38 U/L — ABNORMAL HIGH (ref 0–37)
Albumin: 4.5 g/dL (ref 3.5–5.2)
Alkaline Phosphatase: 44 U/L (ref 39–117)
Bilirubin, Direct: 0.1 mg/dL (ref 0.0–0.3)
Total Bilirubin: 0.6 mg/dL (ref 0.2–1.2)
Total Protein: 6.8 g/dL (ref 6.0–8.3)

## 2020-01-26 LAB — TSH: TSH: 1.5 u[IU]/mL (ref 0.35–4.50)

## 2020-01-26 LAB — HEMOGLOBIN A1C: Hgb A1c MFr Bld: 6.7 % — ABNORMAL HIGH (ref 4.6–6.5)

## 2020-01-26 LAB — VITAMIN D 25 HYDROXY (VIT D DEFICIENCY, FRACTURES): VITD: 47.32 ng/mL (ref 30.00–100.00)

## 2020-01-26 NOTE — Assessment & Plan Note (Signed)
Chronic problem.  Following w/ Dr Chalmers Cater but due for A1C.  UTD on foot exam, eye exam, and on ARB for renal protection.

## 2020-01-26 NOTE — Assessment & Plan Note (Signed)
Check labs and replete prn. 

## 2020-01-26 NOTE — Progress Notes (Signed)
   Subjective:    Patient ID: SWARA DONZE, female    DOB: 10/01/1940, 79 y.o.   MRN: [id number]  HPI CPE- UTD on mammo, COVID, pneumonia vaccines.  Due for flu shot.  No longer doing colon cancer screening.  UTD on foot exam, eye exam.  On ARB for renal protection.  Breast cancer is followed by Dr Irene Limbo.  Reviewed past medical, surgical, family and social histories.   Patient Care Team    Relationship Specialty Notifications Start End  [name], MD PCP - General Family Medicine  04/20/16   Irene Shipper, MD Consulting Physician Gastroenterology  11/14/10   Deneise Lever, MD Consulting Physician Pulmonary Disease  11/14/10   Jacelyn Pi, MD Consulting Physician Endocrinology  11/14/10   Valinda Party, MD Consulting Physician Rheumatology  04/20/16   Clovis Pu, Billey Co., MD Attending Physician Psychiatry  04/20/16   Renie Ora, MD Referring Physician Anesthesiology  04/20/16   Brunetta Genera, MD Consulting Physician Hematology  11/30/16     Health Maintenance  Topic Date Due  . INFLUENZA VACCINE  10/19/2019  . TETANUS/TDAP  09/30/2020 (Originally 08/28/2018)  . Hepatitis C Screening  09/30/2020 (Originally [birth year])  . HEMOGLOBIN A1C  02/06/2020  . FOOT EXAM  09/22/2020  . OPHTHALMOLOGY EXAM  10/29/2020  . DEXA SCAN  Completed  . COVID-19 Vaccine  Completed  . PNA vac Low Risk Adult  Completed      Review of Systems Patient reports no vision/ hearing changes, adenopathy,fever, weight change,  persistant/recurrent hoarseness , swallowing issues, chest pain, palpitations, edema, persistant/recurrent cough, hemoptysis, dyspnea (rest/exertional/paroxysmal nocturnal), gastrointestinal bleeding (melena, rectal bleeding), abdominal pain, significant heartburn, bowel changes, GU symptoms (dysuria, hematuria, incontinence), Gyn symptoms (abnormal  bleeding, pain),  syncope, memory loss, numbness & tingling, skin/hair/nail changes, abnormal bruising or bleeding.   +  weakness- now ambulating w/ a cane  This visit occurred during the SARS-CoV-2 public health emergency.  Safety protocols were in place, including screening questions prior to the visit, additional usage of staff PPE, and extensive cleaning of exam room while observing appropriate contact time as indicated for disinfecting solutions.       Objective:   Physical Exam        Assessment & Plan:

## 2020-01-26 NOTE — Assessment & Plan Note (Signed)
Following w/ Dr Irene Limbo.  UTD on mammogram.

## 2020-01-26 NOTE — Patient Instructions (Addendum)
Follow up in 6 months to recheck BP and cholesterol We'll notify you of your lab results and make any changes if needed Keep up the good work!  You look great!!! Call with any questions or concerns Stay Safe!  Stay Healthy! Happy Holidays!!! 

## 2020-01-26 NOTE — Assessment & Plan Note (Signed)
Pt's PE unchanged from previous.  UTD on mammo, COVID, pneumonia vaccines.  Flu given today.  Check labs.  Anticipatory guidance provided.

## 2020-01-27 ENCOUNTER — Ambulatory Visit (HOSPITAL_COMMUNITY): Payer: Medicare PPO | Admitting: Physical Therapy

## 2020-01-28 DIAGNOSIS — M549 Dorsalgia, unspecified: Secondary | ICD-10-CM | POA: Diagnosis not present

## 2020-01-28 DIAGNOSIS — M150 Primary generalized (osteo)arthritis: Secondary | ICD-10-CM | POA: Diagnosis not present

## 2020-01-28 DIAGNOSIS — M810 Age-related osteoporosis without current pathological fracture: Secondary | ICD-10-CM | POA: Diagnosis not present

## 2020-01-28 DIAGNOSIS — F329 Major depressive disorder, single episode, unspecified: Secondary | ICD-10-CM | POA: Diagnosis not present

## 2020-01-28 DIAGNOSIS — M25539 Pain in unspecified wrist: Secondary | ICD-10-CM | POA: Diagnosis not present

## 2020-01-28 DIAGNOSIS — M797 Fibromyalgia: Secondary | ICD-10-CM | POA: Diagnosis not present

## 2020-01-28 DIAGNOSIS — M79671 Pain in right foot: Secondary | ICD-10-CM | POA: Diagnosis not present

## 2020-01-28 DIAGNOSIS — R2689 Other abnormalities of gait and mobility: Secondary | ICD-10-CM | POA: Diagnosis not present

## 2020-01-28 DIAGNOSIS — Z791 Long term (current) use of non-steroidal anti-inflammatories (NSAID): Secondary | ICD-10-CM | POA: Diagnosis not present

## 2020-01-29 ENCOUNTER — Ambulatory Visit (HOSPITAL_COMMUNITY): Payer: Medicare PPO

## 2020-01-29 ENCOUNTER — Other Ambulatory Visit: Payer: Self-pay

## 2020-01-29 ENCOUNTER — Encounter (HOSPITAL_COMMUNITY): Payer: Self-pay

## 2020-01-29 DIAGNOSIS — R29898 Other symptoms and signs involving the musculoskeletal system: Secondary | ICD-10-CM | POA: Diagnosis not present

## 2020-01-29 DIAGNOSIS — R2689 Other abnormalities of gait and mobility: Secondary | ICD-10-CM | POA: Diagnosis not present

## 2020-01-29 DIAGNOSIS — M6281 Muscle weakness (generalized): Secondary | ICD-10-CM | POA: Diagnosis not present
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
## 2020-01-30 ENCOUNTER — Telehealth: Payer: Self-pay | Admitting: Psychiatry

## 2020-01-30 NOTE — Telephone Encounter (Signed)
Patient can come pick up samples for Viibryd 20 mg, then Dr. Clovis Pu can review and give recommendation.

## 2020-01-30 NOTE — Telephone Encounter (Signed)
Pt called & advised to pick up samples of Viibryd 40 mg . Pt understood to take 1/2 pill of 40 mg = 20 mg daily as advised

## 2020-01-30 NOTE — Telephone Encounter (Signed)
Pt LM on VM asking Traci to return call @ [phone]. She received message 11/11 late. Related to blood work Pt stated.

## 2020-01-30 NOTE — Telephone Encounter (Signed)
Andrea Simmons called to report that she has just finished the samples she had for Viibryd 20mg .  She needs a prescription sent in so she can get some today or she needs to come get samples.  The 20mg  is working ok but perhaps could use a little more.  She feels she felt a little better when she was on Cymbalta and Ritilan.  She is not taking Ritilan any more and doesn't feel that there has been any difference since she stopped taking it.  The main she needs right now is at least the 20mg  Viibryd in samples or prescription.  She doesn't want to go all weekend without.

## 2020-01-30 NOTE — Telephone Encounter (Signed)
Please review first message, will also contact her again and discuss her low Vitamin D level

## 2020-02-02 ENCOUNTER — Other Ambulatory Visit: Payer: Self-pay

## 2020-02-02 ENCOUNTER — Ambulatory Visit: Payer: Medicare PPO | Admitting: General Practice

## 2020-02-02 DIAGNOSIS — I2699 Other pulmonary embolism without acute cor pulmonale: Secondary | ICD-10-CM

## 2020-02-02 DIAGNOSIS — Z7901 Long term (current) use of anticoagulants: Secondary | ICD-10-CM

## 2020-02-02 LAB — POCT INR: INR: 2.4 (ref 2.0–3.0)

## 2020-02-02 NOTE — Patient Instructions (Addendum)
Pre visit review using our clinic review tool, if applicable. No additional management support is needed unless otherwise documented below in the visit note.  Continue to take 1 tablet daily except 1/2 tablet on Sunday and Thursdays.  Re-check in 5 weeks.

## 2020-02-02 NOTE — Telephone Encounter (Signed)
FYI, called and had to leave message.  Suggested she restart vitamin D 2000 units daily.  Her vitamin D level is a little low and we would like to see it in the 50s.  It is hoped that dose will push back into the 50s and if there is a question we will recheck it in 3 months.  She has not been on Viibryd 20 mg long enough to see the full benefit of that.  She needs to continue Viibryd 20 mg for at least a full 4 weeks at that dosage so another 3 weeks or so.  At that point we could consider increasing it if needed.

## 2020-02-03 ENCOUNTER — Encounter (HOSPITAL_COMMUNITY): Payer: Self-pay | Admitting: Physical Therapy

## 2020-02-03 ENCOUNTER — Ambulatory Visit (HOSPITAL_COMMUNITY): Payer: Medicare PPO | Admitting: Physical Therapy

## 2020-02-03 DIAGNOSIS — R2689 Other abnormalities of gait and mobility: Secondary | ICD-10-CM | POA: Diagnosis not present

## 2020-02-03 DIAGNOSIS — R29898 Other symptoms and signs involving the musculoskeletal system: Secondary | ICD-10-CM | POA: Diagnosis not present

## 2020-02-03 DIAGNOSIS — M6281 Muscle weakness (generalized): Secondary | ICD-10-CM | POA: Diagnosis not present

## 2020-02-03 NOTE — Patient Instructions (Signed)
Access Code: [email or web address] URL: [email or web address] Date: 02/03/2020 Prepared by: Mitzi Hansen Candence Sease  Exercises Heel rises with counter support - 1 x daily - 7 x weekly - 2 sets - 10 reps Standing March with Counter Support - 1 x daily - 7 x weekly - 2 sets - 10 reps Step Up - 1 x daily - 7 x weekly - 2 sets - 10 reps Standing Tandem Balance with Counter Support - 1 x daily - 7 x weekly - 2 reps - 30 seconds hold
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
## 2020-02-05 ENCOUNTER — Ambulatory Visit (HOSPITAL_COMMUNITY): Payer: Medicare PPO

## 2020-02-05 ENCOUNTER — Other Ambulatory Visit: Payer: Self-pay | Admitting: Family Medicine

## 2020-02-10 ENCOUNTER — Ambulatory Visit (INDEPENDENT_AMBULATORY_CARE_PROVIDER_SITE_OTHER): Payer: Medicare PPO | Admitting: Psychiatry

## 2020-02-10 ENCOUNTER — Other Ambulatory Visit: Payer: Self-pay

## 2020-02-10 ENCOUNTER — Telehealth: Payer: Self-pay

## 2020-02-10 DIAGNOSIS — F331 Major depressive disorder, recurrent, moderate: Secondary | ICD-10-CM

## 2020-02-10 NOTE — Progress Notes (Signed)
[facility] Counselor/Therapist Progress Note  Patient ID: Andrea Simmons, MRN: [id number],    Date: 02/10/2020  Time Spent: 50 minutes   3:00pm to 3:50pm    Treatment Type: Individual Therapy  Reported Symptoms: depression, anxiety "but depression is better at times, I think the medicine is helping better too"  Mental Status Exam:  Appearance:   Neat     Behavior:  Appropriate, Sharing and Motivated  Motor:  walks a bit slower with a cane, but does well  Speech/Language:   Clear and Coherent  Affect:  Appropriate  Mood:  anxious  Thought process:  goal directed  Thought content:    WNL  Sensory/Perceptual disturbances:    WNL  Orientation:  oriented to person, place, time/date, situation, day of week, month of year and year  Attention:  Good  Concentration:  Fair  Memory:  denies memory issues  Fund of knowledge:   Good  Insight:    Good and Fair  Judgment:   Good and Fair  Impulse Control:  Fair   Risk Assessment: Danger to Self:  No Self-injurious Behavior: No Danger to Others: No Duty to Warn:no Physical Aggression / Violence:No  Access to Firearms a concern: No  Gang Involvement:No   Subjective: Patient report decreased depression and anxiety. Patient states "I'm better than I was. I am some better and am not as depressed.  My medicine is helping better."   Interventions: Solution-Oriented/Positive Psychology and Ego-Supportive  Diagnosis:   ICD-10-CM   1. Major depressive disorder, recurrent episode, moderate ([place])  F33.1      Plan of Care: Patient not signing treatment plan on computer screen due to Covid.  Treatment Goals: Goals remain on tx plan as patient works with strategies to achieve her goals.  Long term goal: Develop healthy cognitive beliefs about self and the world that lead to alleviation of depression and help prevent relapse of depression.  Short term goal: Identify and replace depressive thinking that leads to depressive  feelings and actions.  Strategies: Replace negative self-defeating self-talk with verbalization of realistic and positive messages.   Progress: Patient today reports anxiety and that her depression has decreased some.  Feels medicines "are working better." States she hasn't had much success in trying the "thought replacement, replacing negatives with positives", per discussion from last session.  Is pleasant today, and admits it's sometimes difficult for her to follow up on strategies discussed in session. "I don't always remember." Shares about her physical problems with OA and Fibromyalgia and how those contribute sometimes to her mood. Very concerned about aging "because we've had a lot of friends die already and I'm worried that I could end up not being able to care for myself but hoping that won't happen."  Concerned for her husband and his health and processes her concerns today, adding that it makes her worry more that he is 7 yrs older than she is."  Family remains very important to patient. (Has daughter but doesn't have much time with patient and husband.  States that it does help her to come and talk about her concerns as she does not have many people "to discuss these type of things with".  She is very talkative in sessions and I think that is an indicator of her needing to talk and not having a lot of outside resources.  Picking up from last session when she mentioned the seasonal changes and how they seem to impact her mood.  We reviewed strategies  that might help her during the darker winter months such as adding more light inside her home and leaving the blinds open more during the day as well as increasing her contact with other people via phone calls which she seems to enjoy.  Also stated she had some craft work at home that she might pull back out and work on some during fall and winter.   Goal review and progress/challenges noted with patient.  Next appointment within 3  weeks.   Shanon Ace, LCSW

## 2020-02-10 NOTE — Telephone Encounter (Signed)
Prior authorization submitted and approved for VIIBRYD 20 MG TABLET effective 02/02/2020-03/19/2021 with [facility] ID# [id number]

## 2020-02-11 ENCOUNTER — Ambulatory Visit (HOSPITAL_COMMUNITY): Payer: Medicare PPO | Admitting: Physical Therapy

## 2020-02-13 NOTE — Progress Notes (Deleted)
Subjective:   Andrea Simmons is a 79 y.o. female who presents for Medicare Annual (Subsequent) preventive examination.  I connected with [name] today by telephone and verified that I am speaking with the correct person using two identifiers. Location patient: home Location provider: work Persons participating in the virtual visit: patient, Marine scientist.    I discussed the limitations, risks, security and privacy concerns of performing an evaluation and management service by telephone and the availability of in person appointments. I also discussed with the patient that there may be a patient responsible charge related to this service. The patient expressed understanding and verbally consented to this telephonic visit.    Interactive audio and video telecommunications were attempted between this provider and patient, however failed, due to patient having technical difficulties OR patient did not have access to video capability.  We continued and completed visit with audio only.  Some vital signs may be absent or patient reported.   Time Spent with patient on telephone encounter: *** minutes   Review of Systems    ***       Objective:    There were no vitals filed for this visit. There is no height or weight on file to calculate BMI.  Advanced Directives 01/07/2020 12/29/2018 10/17/2018 07/04/2018 03/27/2018 11/30/2016 09/11/2016  Does Patient Have a Medical Advance Directive? Yes Yes No Yes Yes Yes No  Type of Advance Directive - Living will;Healthcare Power of [place];Living will Living will [place];Living will -  Does patient want to make changes to medical advance directive? No - Patient declined - - No - Patient declined No - Patient declined - -  Copy of [place] in Chart? - - - - No - copy requested No - copy requested -  Would patient like information on creating a medical advance directive? - - No - Patient  declined - No - Patient declined - No - Patient declined    Current Medications (verified) Outpatient Encounter Medications as of 02/16/2020  Medication Sig  . Accu-Chek Softclix Lancets lancets Use as instructed to test sugars BID  . acetaminophen (TYLENOL) 500 MG tablet Take 500 mg by mouth every 6 (six) hours as needed.  Marland Kitchen amLODipine (NORVASC) 10 MG tablet Take 1 tablet (10 mg total) by mouth daily. (Patient taking differently: Take 10 mg by mouth daily. Taking it at night)  . BIOTIN PO Take by mouth. [place]  . Blood Glucose Monitoring Suppl (ACCU-CHEK GUIDE) w/Device KIT 1 each by Does not apply route 2 (two) times daily. To test sugars. Dx. E11.9  . Choline Fenofibrate (FENOFIBRIC ACID) 135 MG CPDR   . fenofibrate (TRICOR) 48 MG tablet Take 1 tablet by mouth daily.  . furosemide (LASIX) 20 MG tablet TAKE 1 TABLET BY MOUTH AS NEEDED (Patient taking differently: Take 20 mg by mouth daily. Only takes as needed)  . glucose blood (ACCU-CHEK GUIDE) test strip CHECK BLOOD SUGARS DAILY  . levothyroxine (SYNTHROID) 25 MCG tablet TAKE 1 TABLET BY MOUTH DAILY BEFORE BREAKFAST.  . Magnesium 500 MG TABS Take by mouth.  . metFORMIN (GLUCOPHAGE-XR) 500 MG 24 hr tablet Take 500 mg by mouth 2 (two) times daily.   . methylphenidate (RITALIN) 10 MG tablet Take 1 tablet (10 mg total) by mouth 2 (two) times daily.  . nitroGLYCERIN (NITROSTAT) 0.4 MG SL tablet Place 1 tablet (0.4 mg total) under the tongue every 5 (five) minutes as needed for  chest pain.  . Omega-3 Fatty Acids (FISH OIL) 500 MG CAPS Take 1 capsule by mouth 2 (two) times daily.   . RESTASIS MULTIDOSE 0.05 % ophthalmic emulsion Place 1 drop into both eyes 2 (two) times daily.   . rosuvastatin (CRESTOR) 20 MG tablet Take 20 mg by mouth daily.   . valsartan-hydrochlorothiazide (DIOVAN-HCT) 320-25 MG tablet Take 1 tablet by mouth in the morning.  . Vilazodone HCl 20 MG TABS Take 1 tablet (20 mg total) by mouth daily.  Marland Kitchen warfarin  (COUMADIN) 5 MG tablet 51m on Sunday, 2.575mAll other days   No facility-administered encounter medications on file as of 02/16/2020.    Allergies (verified) Anaprox [naproxen sodium], Lipitor [atorvastatin], Morphine, Meperidine, and Naproxen sodium   History: Past Medical History:  Diagnosis Date  . Anemia   . Anxiety   . Breast cancer ([place]  . Depression   . Diabetes mellitus type II   . Fibromyalgia   . GERD (gastroesophageal reflux disease)   . Hyperlipidemia   . Hypertension   . Malignant neoplasm of breast (female), unspecified site 1993   L breast s/p mastectomy and tamoxifen x 5y43yr. 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 Unspecified hypothyroidism    Past Surgical History:  Procedure Laterality Date  . APPENDECTOMY    . BREAST BIOPSY Left 1993  . BREAST BIOPSY Right 09/25/2014   stero. Benign  . BREAST RECONSTRUCTION Left   . CATARACT EXTRACTION    . MASTECTOMY Left   . ROTATOR CUFF REPAIR    . SPINAL FUSION      x 2  . TOTAL ABDOMINAL HYSTERECTOMY W/ BILATERAL SALPINGOOPHORECTOMY    . TUBAL LIGATION     Family History  Problem Relation Age of Onset  . Depression Other        Parent  . Arthritis Other        Parent, Grandparent  . Hypertension Other        Grandparent  . Hyperlipidemia Other        [facility] Miscarriages / Stillbirths Other        Grandparent  . Stroke Other        [facility] Maternal Uncle        prostate  . Hypertension Maternal Grandfather   . Breast cancer Cousin   . Breast cancer Cousin    Social History   Socioeconomic History  . Marital status: Married    Spouse name: Not on file  . Number of children: 1  . Years of education: Not on file  . Highest education level: Not on file  Occupational History  . Occupation: teaArmed forces technical officerETIRED  . 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 Smoking status: Never Smoker  .  Smokeless tobacco: Never Used  Vaping Use  . Vaping Use: Never used  Substance and Sexual Activity  . Alcohol use: No    Alcohol/week: 0.0 standard drinks  . Drug use: No  . Sexual activity: Not on file  Other Topics Concern  . Not on file  Social History Narrative  . Not on file   Social Determinants of Health   Financial Resource Strain:   . Difficulty of Paying Living Expenses: Not on file  Food Insecurity:   . Worried About RunCharity fundraiser the Last Year: Not on file  .  Ran Out of Food in the Last Year: Not on file  Transportation Needs:   . Lack of Transportation (Medical): Not on file  . Lack of Transportation (Non-Medical): Not on file  Physical Activity:   . Days of Exercise per Week: Not on file  . Minutes of Exercise per Session: Not on file  Stress:   . Feeling of Stress : Not on file  Social Connections:   . Frequency of Communication with Friends and Family: Not on file  . Frequency of Social Gatherings with Friends and Family: Not on file  . Attends Religious Services: Not on file  . Active Member of Clubs or Organizations: Not on file  . Attends Archivist Meetings: Not on file  . Marital Status: Not on file    Tobacco Counseling Counseling given: Not Answered   Clinical Intake:                 Diabetes:  Is the patient diabetic?  Yes  If diabetic, was a CBG obtained today?  No  Did the patient bring in their glucometer from home?  No phone visit How often do you monitor your CBG's? ***.   Financial Strains and Diabetes Management:  Are you having any financial strains with the device, your supplies or your medication? {YES/NO:21197}.  Does the patient want to be seen by Chronic Care Management for management of their diabetes?  {YES/NO:21197} Would the patient like to be referred to a Nutritionist or for Diabetic Management?  {YES/NO:21197}  Diabetic Exams:  Diabetic Eye Exam: Completed 10/30/2019.   Diabetic Foot  Exam: Completed 09/23/2019.            Activities of Daily Living In your present state of health, do you have any difficulty performing the following activities: 01/26/2020 12/23/2019  Hearing? N N  Vision? N N  Difficulty concentrating or making decisions? N N  Walking or climbing stairs? N N  Dressing or bathing? N N  Doing errands, shopping? N N  Some recent data might be hidden    Patient Care Team: [name], MD as PCP - General (Family Medicine) Irene Shipper, MD as Consulting Physician (Gastroenterology) Deneise Lever, MD as Consulting Physician (Pulmonary Disease) Jacelyn Pi, MD as Consulting Physician (Endocrinology) Valinda Party, MD as Consulting Physician (Rheumatology) [name], Billey Co., MD as Attending Physician (Psychiatry) Renie Ora, MD as Referring Physician (Anesthesiology) Brunetta Genera, MD as Consulting Physician (Hematology)  Indicate any recent Medical Services you may have received from other than [facility] providers in the past year (date may be approximate).     Assessment:   This is a routine wellness examination for Dorma.  Hearing/Vision screen No exam data present  Dietary issues and exercise activities discussed:    Goals      Patient Stated   .  patient (pt-stated)      Continue to be active.       Depression Screen PHQ 2/9 Scores 01/26/2020 12/23/2019 10/01/2019 08/06/2019 01/10/2019 01/10/2019 10/21/2018  PHQ - 2 Score 0 _0 0 0 0  PHQ- 9 Score 0 _1 0 0 0    Fall Risk Fall Risk  01/26/2020 12/23/2019 10/01/2019 08/06/2019 01/10/2019  Falls in the past year? _2 0 0  Number falls in past yr: _3 0 0  Comment 5 since march 2021 - - - -  Injury with Fall? 0 1 0 0 0  Risk for  fall due to : History of fall(s) Impaired mobility;Impaired balance/gait Impaired balance/gait;Impaired mobility - -  Follow up - Falls evaluation completed Falls evaluation completed Falls evaluation completed Falls evaluation  completed    Any stairs in or around the home? {YES/NO:21197} If so, are there any without handrails? {YES/NO:21197} Home free of loose throw rugs in walkways, pet beds, electrical cords, etc? {YES/NO:21197} Adequate lighting in your home to reduce risk of falls? {YES/NO:21197}  ASSISTIVE DEVICES UTILIZED TO PREVENT FALLS:  Life alert? {YES/NO:21197} Use of a cane, walker or w/c? {YES/NO:21197} Grab bars in the bathroom? {YES/NO:21197} Shower chair or bench in shower? {YES/NO:21197} Elevated toilet seat or a handicapped toilet? {YES/NO:21197}  TIMED UP AND GO:  Was the test performed? {YES/NO:21197}.  Length of time to ambulate 10 feet: *** sec.   {Appearance of Gait:2101803}  Cognitive Function: MMSE - Mini Mental State Exam 11/20/2014  Orientation to time 5  Orientation to Place 5  Registration 3  Attention/ Calculation 5  Recall 2  Language- name 2 objects 2  Language- repeat 1  Language- follow 3 step command 3  Language- read & follow direction 1  Write a sentence 1  Copy design 1  Total score 29        Immunizations Immunization History  Administered Date(s) Administered  . Fluad Quad(high Dose 65+) 01/10/2019, 01/26/2020  . Influenza Whole 03/21/2007, 12/18/2008, 01/20/2010  . Influenza, High Dose Seasonal PF 01/19/2015, 02/21/2016, 01/22/2017  . Influenza,inj,Quad PF,6+ Mos 01/22/2013  . Influenza-Unspecified 02/18/2012  . Moderna SARS-COVID-2 Vaccination 05/19/2019, 06/16/2019, 02/03/2020  . Pneumococcal Conjugate-13 04/20/2016  . Pneumococcal Polysaccharide-23 03/20/2006, 12/19/2007  . Td 08/27/2008  . Zoster 12/11/2012  . Zoster Recombinat (Shingrix) 12/20/2016, 05/09/2017    TDAP status: Due, Education has been provided regarding the importance of this vaccine. Advised may receive this vaccine at local pharmacy or Health Dept. Aware to provide a copy of the vaccination record if obtained from local pharmacy or Health Dept. Verbalized acceptance and  understanding.   Flu Vaccine status: Up to date   Pneumococcal vaccine status: Up to date   Covid-19 vaccine status: Completed vaccines  Qualifies for Shingles Vaccine? No   Zostavax completed Yes   Shingrix Completed?: Yes  Screening Tests Health Maintenance  Topic Date Due  . TETANUS/TDAP  09/30/2020 (Originally 08/28/2018)  . Hepatitis C Screening  09/30/2020 (Originally 03/03/1941)  . HEMOGLOBIN A1C  07/25/2020  . FOOT EXAM  09/22/2020  . OPHTHALMOLOGY EXAM  10/29/2020  . INFLUENZA VACCINE  Completed  . DEXA SCAN  Completed  . COVID-19 Vaccine  Completed  . PNA vac Low Risk Adult  Completed    Health Maintenance  There are no preventive care reminders to display for this patient.  Colorectal cancer screening: No longer required.    Mammogram status: Completed Unilateral-Right-08/31/2019. Repeat every year  Left Mastectomy  {Bone Density status:21018021}  Lung Cancer Screening: (Low Dose CT Chest recommended if Age 33-80 years, 30 pack-year currently smoking OR have quit w/in 15years.) does not qualify.     Additional Screening:  Hepatitis C Screening: does not qualify  Vision Screening: Recommended annual ophthalmology exams for early detection of glaucoma and other disorders of the eye. Is the patient up to date with their annual eye exam?  Yes  Who is the provider or what is the name of the office in which the patient attends annual eye exams? ***   Dental Screening: Recommended annual dental exams for proper oral hygiene  Community Resource Referral / Chronic  Care Management: CRR required this visit?  {YES/NO:21197}  CCM required this visit?  {YES/NO:21197}     Plan:     I have personally reviewed and noted the following in the patient's chart:   . Medical and social history . Use of alcohol, tobacco or illicit drugs  . Current medications and supplements . Functional ability and status . Nutritional status . Physical activity . Advanced  directives . List of other physicians . Hospitalizations, surgeries, and ER visits in previous 12 months . Vitals . Screenings to include cognitive, depression, and falls . Referrals and appointments  In addition, I have reviewed and discussed with patient certain preventive protocols, quality metrics, and best practice recommendations. A written personalized care plan for preventive services as well as general preventive health recommendations were provided to patient.   Due to this being a telephonic visit, the after visit summary with patients personalized plan was offered to patient via mail or my-chart.  Patient would like to access on my-chart.   Marta Antu, LPN   [id number]  Nurse Health Advisor  Nurse Notes: ***

## 2020-02-16 ENCOUNTER — Other Ambulatory Visit: Payer: Self-pay

## 2020-02-16 ENCOUNTER — Ambulatory Visit (INDEPENDENT_AMBULATORY_CARE_PROVIDER_SITE_OTHER): Payer: Medicare PPO

## 2020-02-16 ENCOUNTER — Ambulatory Visit: Payer: Medicare PPO

## 2020-02-16 ENCOUNTER — Ambulatory Visit: Payer: Medicare PPO | Admitting: Podiatry

## 2020-02-16 DIAGNOSIS — M2022 Hallux rigidus, left foot: Secondary | ICD-10-CM | POA: Diagnosis not present

## 2020-02-16 DIAGNOSIS — E1149 Type 2 diabetes mellitus with other diabetic neurological complication: Secondary | ICD-10-CM

## 2020-02-16 DIAGNOSIS — M778 Other enthesopathies, not elsewhere classified: Secondary | ICD-10-CM

## 2020-02-16 DIAGNOSIS — M79672 Pain in left foot: Secondary | ICD-10-CM | POA: Diagnosis not present

## 2020-02-16 NOTE — Patient Instructions (Signed)
You can use Biofreeze or Aspercreme with lidocaine

## 2020-02-17 DIAGNOSIS — I10 Essential (primary) hypertension: Secondary | ICD-10-CM | POA: Diagnosis not present

## 2020-02-18 NOTE — Progress Notes (Signed)
Subjective:   Patient ID: Andrea Simmons, female   DOB: 79 y.o.   MRN: [id number]   HPI 79 year old female presents the office for concerns of left foot pain most of the big toe joint.  She is not sure if she had a bunion but she states that joint will hurt and swell at times.  She describes a throbbing sensation.  She states it hurts more but she is on her foot.  Previously was recommended that she needed diabetic/orthopedic shoes.  She did have orthotics previously but she does not wear them.  She denies any recent injury or trauma to her foot.  Last A1c was 6.7.  Review of Systems  All other systems reviewed and are negative.       Objective:  Physical Exam  General: AAO x3, NAD  Dermatological:  There are no open sores, no preulcerative lesions, no rash or signs of infection present.  Vascular: Dorsalis Pedis artery and Posterior Tibial artery pedal pulses are 2/4 bilateral with immedate capillary fill time.There is no pain with calf compression, swelling, warmth, erythema.   Neruologic: Sensation minimally decreased with Semmes Weinstein monofilament.  Musculoskeletal: There is decreased range of motion of the left first MPJ and there is minimal edema there is no erythema or warmth.  There is no open lesions.  No other areas of discomfort.  Muscular strength 5/5 in all groups tested bilateral.  Gait: Unassisted, Nonantalgic.       Assessment:   Left foot hallux rigidus    Plan:  -Treatment options discussed including all alternatives, risks, and complications -Etiology of symptoms were discussed -Arthritic changes present the first MPJ.  There is no evidence of acute fracture. -We discussed steroid injection we held off on this.  Discussed wearing a stiff soled shoe.  She will use over-the-counter Biofreeze or Aspercreme with lidocaine as needed.  She was interested in getting diabetic/orthopedic shoes.  I will have her follow-up with our pedorthitst for this.   Trula Slade DPM

## 2020-02-20 NOTE — Progress Notes (Signed)
Subjective:   Andrea Simmons is a 79 y.o. female who presents for Medicare Annual (Subsequent) preventive examination.  I connected with Cyndee today by telephone and verified that I am speaking with the correct person using two identifiers. Location patient: home Location provider: work Persons participating in the virtual visit: patient, Marine scientist.    I discussed the limitations, risks, security and privacy concerns of performing an evaluation and management service by telephone and the availability of in person appointments. I also discussed with the patient that there may be a patient responsible charge related to this service. The patient expressed understanding and verbally consented to this telephonic visit.    Interactive audio and video telecommunications were attempted between this provider and patient, however failed, due to patient having technical difficulties OR patient did not have access to video capability.  We continued and completed visit with audio only.  Some vital signs may be absent or patient reported.   Time Spent with patient on telephone encounter: 45 minutes   Review of Systems     Cardiac Risk Factors include: advanced age (>72mn, >>18women);diabetes mellitus;dyslipidemia;hypertension     Objective:    Today's Vitals   02/23/20 1501 02/23/20 1502  Weight: 139 lb (63 kg)   Height: _0  (1.575 m)   PainSc:  4    Body mass index is 25.42 kg/m.  Advanced Directives 02/23/2020 01/07/2020 12/29/2018 10/17/2018 07/04/2018 03/27/2018 11/30/2016  Does Patient Have a Medical Advance Directive? Yes Yes Yes No Yes Yes Yes  Type of AParamed[place]of [place] will - Living will;Healthcare Power of [place]iving will Living will [place] Hope[place] will  Does patient want to make changes to medical advance directive? No - Patient declined No - Patient declined - - No - Patient declined No -  Patient declined -  Copy of [place] Chart? No - copy requested - - - - No - copy requested No - copy requested  Would patient like information on creating a medical advance directive? - - - No - Patient declined - No - Patient declined -    Current Medications (verified) Outpatient Encounter Medications as of 02/23/2020  Medication Sig  . Accu-Chek Softclix Lancets lancets Use as instructed to test sugars BID  . acetaminophen (TYLENOL) 500 MG tablet Take 500 mg by mouth every 6 (six) hours as needed.  .Marland KitchenamLODipine (NORVASC) 10 MG tablet Take 1 tablet (10 mg total) by mouth daily. (Patient taking differently: Take 10 mg by mouth daily. Taking it at night)  . BIOTIN PO Take by mouth. [place] . Blood Glucose Monitoring Suppl (ACCU-CHEK GUIDE) w/Device KIT 1 each by Does not apply route 2 (two) times daily. To test sugars. Dx. E11.9  . Choline Fenofibrate (FENOFIBRIC ACID) 135 MG CPDR   . fenofibrate (TRICOR) 48 MG tablet Take 1 tablet by mouth daily.  . furosemide (LASIX) 20 MG tablet TAKE 1 TABLET BY MOUTH AS NEEDED (Patient taking differently: Take 20 mg by mouth daily. Only takes as needed)  . glucose blood (ACCU-CHEK GUIDE) test strip CHECK BLOOD SUGARS DAILY  . levothyroxine (SYNTHROID) 25 MCG tablet TAKE 1 TABLET BY MOUTH DAILY BEFORE BREAKFAST.  . Magnesium 500 MG TABS Take by mouth.  . metFORMIN (GLUCOPHAGE-XR) 500 MG 24 hr tablet Take 500 mg by mouth 2 (two) times daily.   . methylphenidate (RITALIN) 10 MG tablet Take 1 tablet (10 mg  total) by mouth 2 (two) times daily.  . nitroGLYCERIN (NITROSTAT) 0.4 MG SL tablet Place 1 tablet (0.4 mg total) under the tongue every 5 (five) minutes as needed for chest pain.  . Omega-3 Fatty Acids (FISH OIL) 500 MG CAPS Take 1 capsule by mouth 2 (two) times daily.   . RESTASIS MULTIDOSE 0.05 % ophthalmic emulsion Place 1 drop into both eyes 2 (two) times daily.   . rosuvastatin (CRESTOR) 20 MG tablet Take 20 mg by  mouth daily.   . valsartan-hydrochlorothiazide (DIOVAN-HCT) 320-25 MG tablet Take 1 tablet by mouth in the morning.  . Vilazodone HCl 20 MG TABS Take 1 tablet (20 mg total) by mouth daily.  Marland Kitchen warfarin (COUMADIN) 5 MG tablet 81m on Sunday, 2.556mAll other days   No facility-administered encounter medications on file as of 02/23/2020.    Allergies (verified) Anaprox [naproxen sodium], Lipitor [atorvastatin], Morphine, Meperidine, and Naproxen sodium   History: Past Medical History:  Diagnosis Date  . Anemia   . Anxiety   . Breast cancer ([facility]  . Depression   . Diabetes mellitus type II   . Fibromyalgia   . GERD (gastroesophageal reflux disease)   . Hyperlipidemia   . Hypertension   . Malignant neoplasm of breast (female), unspecified site 1993   L breast s/p mastectomy and tamoxifen x 5y20yr. 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 Unspecified hypothyroidism    Past Surgical History:  Procedure Laterality Date  . APPENDECTOMY    . BREAST BIOPSY Left 1993  . BREAST BIOPSY Right 09/25/2014   stero. Benign  . BREAST RECONSTRUCTION Left   . CATARACT EXTRACTION    . MASTECTOMY Left   . ROTATOR CUFF REPAIR    . SPINAL FUSION      x 2  . TOTAL ABDOMINAL HYSTERECTOMY W/ BILATERAL SALPINGOOPHORECTOMY    . TUBAL LIGATION     Family History  Problem Relation Age of Onset  . Depression Other        Parent  . Arthritis Other        Parent, Grandparent  . Hypertension Other        Grandparent  . Hyperlipidemia Other        FMHPort Royal Miscarriages / Stillbirths Other        Grandparent  . Stroke Other        [facility] Cancer Maternal Uncle        prostate  . Hypertension Maternal Grandfather   . Breast cancer Cousin   . Breast cancer Cousin    Social History   Socioeconomic History  . Marital status: Married    Spouse name: Not on file  . Number of children: 1  . Years of education: Not on file  . Highest education level: Not  on file  Occupational History  . Occupation: teaArmed forces technical officerETIRED  . 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 Smoking status: Never Smoker  . Smokeless tobacco: Never Used  Vaping Use  . Vaping Use: Never used  Substance and Sexual Activity  . Alcohol use: No    Alcohol/week: 0.0 standard drinks  . Drug use: No  . Sexual activity: Not on file  Other Topics Concern  . Not on file  Social History Narrative  . Not on file   Social Determinants of Health   Financial Resource Strain:  Low Risk   . Difficulty of Paying Living Expenses: Not hard at all  Food Insecurity: No Food Insecurity  . Worried About Charity fundraiser in the Last Year: Never true  . Ran Out of Food in the Last Year: Never true  Transportation Needs: No Transportation Needs  . Lack of Transportation (Medical): No  . Lack of Transportation (Non-Medical): No  Physical Activity: Inactive  . Days of Exercise per Week: 0 days  . Minutes of Exercise per Session: 0 min  Stress: No Stress Concern Present  . Feeling of Stress : Only a little  Social Connections: Moderately Integrated  . Frequency of Communication with Friends and Family: More than three times a week  . Frequency of Social Gatherings with Friends and Family: Once a week  . Attends Religious Services: 1 to 4 times per year  . Active Member of Clubs or Organizations: No  . Attends Archivist Meetings: Never  . Marital Status: Married    Tobacco Counseling Counseling given: Not Answered   Clinical Intake:  Pre-visit preparation completed: Yes  Pain : 0-10 Pain Score: 4  Pain Type: Chronic pain Pain Location: Generalized (fibromyalgia) Pain Onset: More than a month ago Pain Frequency: Constant     Nutritional Status: BMI 25 -29 Overweight Nutritional Risks: None Diabetes: Yes CBG done?: No Did pt. bring in CBG monitor from home?: No (phone visit)  How often do you need  to have someone help you when you read instructions, pamphlets, or other written materials from your doctor or pharmacy?: 1 - Never What is the last grade level you completed in school?: 12th grade & beauty school  Diabetes:  Is the patient diabetic?  Yes  If diabetic, was a CBG obtained today?  No  Did the patient bring in their glucometer from home?  No phone visit How often do you monitor your CBG's? daily.   Financial Strains and Diabetes Management:  Are you having any financial strains with the device, your supplies or your medication? No .  Does the patient want to be seen by Chronic Care Management for management of their diabetes?  No  Would the patient like to be referred to a Nutritionist or for Diabetic Management?  No   Diabetic Exams:  Diabetic Eye Exam: Completed 10/30/2019.   Diabetic Foot Exam: Completed 09/23/2019.    Interpreter Needed?: No  Information entered by :: Caroleen Hamman LPN   Activities of Daily Living In your present state of health, do you have any difficulty performing the following activities: 02/23/2020 01/26/2020  Hearing? N N  Vision? N N  Difficulty concentrating or making decisions? N N  Walking or climbing stairs? N N  Dressing or bathing? N N  Doing errands, shopping? N N  Preparing Food and eating ? N -  Using the Toilet? N -  In the past six months, have you accidently leaked urine? N -  Do you have problems with loss of bowel control? N -  Managing your Medications? N -  Managing your Finances? N -  Housekeeping or managing your Housekeeping? N -  Some recent data might be hidden    Patient Care Team: [name], MD as PCP - General (Family Medicine) Irene Shipper, MD as Consulting Physician (Gastroenterology) Deneise Lever, MD as Consulting Physician (Pulmonary Disease) Jacelyn Pi, MD as Consulting Physician (Endocrinology) Valinda Party, MD as Consulting Physician (Rheumatology) [name], Billey Co., MD as  Attending Physician (Psychiatry)  Renie Ora, MD as Referring Physician (Anesthesiology) Brunetta Genera, MD as Consulting Physician (Hematology)  Indicate any recent Medical Services you may have received from other than [facility] providers in the past year (date may be approximate).     Assessment:   This is a routine wellness examination for My.  Hearing/Vision screen  Hearing Screening   _0  _1  _2  _3  _4  _5  _6  _7  _8   Right ear:           Left ear:           Comments: No issues  Vision Screening Comments: Wears glasses Last eye exam-10/2019-[facility]  Dietary issues and exercise activities discussed: Current Exercise Habits: The patient does not participate in regular exercise at present (pt states she stays active around the house), Exercise limited by: None identified  Goals      Patient Stated   .  patient (pt-stated)      Continue to be active.       Depression Screen PHQ 2/9 Scores 02/23/2020 01/26/2020 12/23/2019 10/01/2019 08/06/2019 01/10/2019 01/10/2019  PHQ - 2 Score 1 0 _9 0 0  PHQ- 9 Score - 0 _10 0 0    Fall Risk Fall Risk  02/23/2020 01/26/2020 12/23/2019 10/01/2019 08/06/2019  Falls in the past year? _11 0  Number falls in past yr: _12 0  Comment - 5 since march 2021 - - -  Injury with Fall? 1 0 1 0 0  Risk for fall due to : History of fall(s) History of fall(s) Impaired mobility;Impaired balance/gait Impaired balance/gait;Impaired mobility -  Follow up Falls prevention discussed - Falls evaluation completed Falls evaluation completed Falls evaluation completed    Any stairs in or around the home? Yes  If so, are there any without handrails? No  Home free of loose throw rugs in walkways, pet beds, electrical cords, etc? Yes  Adequate lighting in your home to reduce risk of falls? Yes   ASSISTIVE DEVICES UTILIZED TO PREVENT FALLS:  Life alert? No  Use of a cane, walker or w/c? No  Grab bars in the  bathroom? Yes  Shower chair or bench in shower? No  Elevated toilet seat or a handicapped toilet? No   TIMED UP AND GO:  Was the test performed? No . Phone visit   Cognitive Function:No cognitive impairment noted MMSE - Mini Mental State Exam 11/20/2014  Orientation to time 5  Orientation to Place 5  Registration 3  Attention/ Calculation 5  Recall 2  Language- name 2 objects 2  Language- repeat 1  Language- follow 3 step command 3  Language- read & follow direction 1  Write a sentence 1  Copy design 1  Total score 29        Immunizations Immunization History  Administered Date(s) Administered  . Fluad Quad(high Dose 65+) 01/10/2019, 01/26/2020  . Influenza Whole 03/21/2007, 12/18/2008, 01/20/2010  . Influenza, High Dose Seasonal PF 01/19/2015, 02/21/2016, 01/22/2017  . Influenza,inj,Quad PF,6+ Mos 01/22/2013  . Influenza-Unspecified 02/18/2012  . Moderna SARS-COVID-2 Vaccination 05/19/2019, 06/16/2019, 02/03/2020  . Pneumococcal Conjugate-13 04/20/2016  . Pneumococcal Polysaccharide-23 03/20/2006, 12/19/2007  . Td 08/27/2008  . Zoster 12/11/2012  . Zoster Recombinat (Shingrix) 12/20/2016, 05/09/2017    TDAP status: Due, Education has been provided regarding the importance of this vaccine. Advised may receive this vaccine at local pharmacy or Health Dept. Aware to provide a copy of the vaccination record if obtained from local pharmacy or  Health Dept. Verbalized acceptance and understanding.   Flu Vaccine status: Up to date   Pneumococcal vaccine status: Up to date   Covid-19 vaccine status: Completed vaccines  Qualifies for Shingles Vaccine? No   Zostavax completed Yes   Shingrix Completed?: Yes  Screening Tests Health Maintenance  Topic Date Due  . TETANUS/TDAP  09/30/2020 (Originally 08/28/2018)  . Hepatitis C Screening  09/30/2020 (Originally 1940/04/07)  . HEMOGLOBIN A1C  07/25/2020  . FOOT EXAM  09/22/2020  . OPHTHALMOLOGY EXAM  10/29/2020  . INFLUENZA  VACCINE  Completed  . DEXA SCAN  Completed  . COVID-19 Vaccine  Completed  . PNA vac Low Risk Adult  Completed    Health Maintenance  There are no preventive care reminders to display for this patient.  Colorectal cancer screening: No longer required.    Mammogram status: Completed Unilateral-Right-09/08/2019. Repeat every year   Bone Density status: Ordered today. Pt provided with contact info and advised to call to schedule appt.  Lung Cancer Screening: (Low Dose CT Chest recommended if Age 1-80 years, 30 pack-year currently smoking OR have quit w/in 15years.) does not qualify.     Additional Screening:  Hepatitis C Screening: does not qualify  Vision Screening: Recommended annual ophthalmology exams for early detection of glaucoma and other disorders of the eye. Is the patient up to date with their annual eye exam?  Yes  Who is the provider or what is the name of the office in which the patient attends annual eye exams? [place] Screening: Recommended annual dental exams for proper oral hygiene  Community Resource Referral / Chronic Care Management: CRR required this visit?  No   CCM required this visit?  No      Plan:     I have personally reviewed and noted the following in the patient's chart:   . Medical and social history . Use of alcohol, tobacco or illicit drugs  . Current medications and supplements . Functional ability and status . Nutritional status . Physical activity . Advanced directives . List of other physicians . Hospitalizations, surgeries, and ER visits in previous 12 months . Vitals . Screenings to include cognitive, depression, and falls . Referrals and appointments  In addition, I have reviewed and discussed with patient certain preventive protocols, quality metrics, and best practice recommendations. A written personalized care plan for preventive services as well as general preventive health recommendations were provided  to patient.   Due to this being a telephonic visit, the after visit summary with patients personalized plan was offered to patient via mail or my-chart.per request, patient was mailed a copy of [place], LPN   76/09/2092  Nurse Health Advisor  Nurse Notes: None

## 2020-02-23 ENCOUNTER — Ambulatory Visit (INDEPENDENT_AMBULATORY_CARE_PROVIDER_SITE_OTHER): Payer: Medicare PPO

## 2020-02-23 VITALS — Ht 62.0 in | Wt 139.0 lb

## 2020-02-23 DIAGNOSIS — Z0000 Encounter for general adult medical examination without abnormal findings: Secondary | ICD-10-CM | POA: Diagnosis not present

## 2020-02-23 DIAGNOSIS — Z780 Asymptomatic menopausal state: Secondary | ICD-10-CM | POA: Diagnosis not present

## 2020-02-23 NOTE — Patient Instructions (Signed)
Andrea Simmons , Thank you for taking time to complete your Medicare Wellness Visit. I appreciate your ongoing commitment to your health goals. Please review the following plan we discussed and let me know if I can assist you in the future.   Screening recommendations/referrals: Colonoscopy: No longer required Mammogram: Completed right-09/08/2019-Due 09/07/2020 Bone Density: Ordered today. Someone will be calling you to schedule. Recommended yearly ophthalmology/optometry visit for glaucoma screening and checkup Recommended yearly dental visit for hygiene and checkup  Vaccinations: Influenza vaccine: Up to date Pneumococcal vaccine: Completed vaccines Tdap vaccine: Discuss with pharmacy Shingles vaccine: Completed vaccines Covid-19:Completed vaccines  Advanced directives: Please bring a copy for your chart  Conditions/risks identified: See problem list  Next appointment: Follow up in one year for your annual wellness visit    Preventive Care 65 Years and Older, Female Preventive care refers to lifestyle choices and visits with your health care provider that can promote health and wellness. What does preventive care include?  A yearly physical exam. This is also called an annual well check.  Dental exams once or twice a year.  Routine eye exams. Ask your health care provider how often you should have your eyes checked.  Personal lifestyle choices, including:  Daily care of your teeth and gums.  Regular physical activity.  Eating a healthy diet.  Avoiding tobacco and drug use.  Limiting alcohol use.  Practicing safe sex.  Taking low-dose aspirin every day.  Taking vitamin and mineral supplements as recommended by your health care provider. What happens during an annual well check? The services and screenings done by your health care provider during your annual well check will depend on your age, overall health, lifestyle risk factors, and family history of  disease. Counseling  Your health care provider may ask you questions about your:  Alcohol use.  Tobacco use.  Drug use.  Emotional well-being.  Home and relationship well-being.  Sexual activity.  Eating habits.  History of falls.  Memory and ability to understand (cognition).  Work and work Statistician.  Reproductive health. Screening  You may have the following tests or measurements:  Height, weight, and BMI.  Blood pressure.  Lipid and cholesterol levels. These may be checked every 5 years, or more frequently if you are over 70 years old.  Skin check.  Lung cancer screening. You may have this screening every year starting at age 34 if you have a 30-pack-year history of smoking and currently smoke or have quit within the past 15 years.  Fecal occult blood test (FOBT) of the stool. You may have this test every year starting at age 73.  Flexible sigmoidoscopy or colonoscopy. You may have a sigmoidoscopy every 5 years or a colonoscopy every 10 years starting at age 63.  Hepatitis C blood test.  Hepatitis B blood test.  Sexually transmitted disease (STD) testing.  Diabetes screening. This is done by checking your blood sugar (glucose) after you have not eaten for a while (fasting). You may have this done every 1-3 years.  Bone density scan. This is done to screen for osteoporosis. You may have this done starting at age 26.  Mammogram. This may be done every 1-2 years. Talk to your health care provider about how often you should have regular mammograms. Talk with your health care provider about your test results, treatment options, and if necessary, the need for more tests. Vaccines  Your health care provider may recommend certain vaccines, such as:  Influenza vaccine. This is recommended every year.  Tetanus, diphtheria, and acellular pertussis (Tdap, Td) vaccine. You may need a Td booster every 10 years.  Zoster vaccine. You may need this after age  4.  Pneumococcal 13-valent conjugate (PCV13) vaccine. One dose is recommended after age 106.  Pneumococcal polysaccharide (PPSV23) vaccine. One dose is recommended after age 30. Talk to your health care provider about which screenings and vaccines you need and how often you need them. This information is not intended to replace advice given to you by your health care provider. Make sure you discuss any questions you have with your health care provider. Document Released: 04/02/2015 Document Revised: 11/24/2015 Document Reviewed: 01/05/2015 Elsevier Interactive Patient Education  2017 [employer] Prevention in the Home Falls can cause injuries. They can happen to people of all ages. There are many things you can do to make your home safe and to help prevent falls. What can I do on the outside of my home?  Regularly fix the edges of walkways and driveways and fix any cracks.  Remove anything that might make you trip as you walk through a door, such as a raised step or threshold.  Trim any bushes or trees on the path to your home.  Use bright outdoor lighting.  Clear any walking paths of anything that might make someone trip, such as rocks or tools.  Regularly check to see if handrails are loose or broken. Make sure that both sides of any steps have handrails.  Any raised decks and porches should have guardrails on the edges.  Have any leaves, snow, or ice cleared regularly.  Use sand or salt on walking paths during winter.  Clean up any spills in your garage right away. This includes oil or grease spills. What can I do in the bathroom?  Use night lights.  Install grab bars by the toilet and in the tub and shower. Do not use towel bars as grab bars.  Use non-skid mats or decals in the tub or shower.  If you need to sit down in the shower, use a plastic, non-slip stool.  Keep the floor dry. Clean up any water that spills on the floor as soon as it happens.  Remove  soap buildup in the tub or shower regularly.  Attach bath mats securely with double-sided non-slip rug tape.  Do not have throw rugs and other things on the floor that can make you trip. What can I do in the bedroom?  Use night lights.  Make sure that you have a light by your bed that is easy to reach.  Do not use any sheets or blankets that are too big for your bed. They should not hang down onto the floor.  Have a firm chair that has side arms. You can use this for support while you get dressed.  Do not have throw rugs and other things on the floor that can make you trip. What can I do in the kitchen?  Clean up any spills right away.  Avoid walking on wet floors.  Keep items that you use a lot in easy-to-reach places.  If you need to reach something above you, use a strong step stool that has a grab bar.  Keep electrical cords out of the way.  Do not use floor polish or wax that makes floors slippery. If you must use wax, use non-skid floor wax.  Do not have throw rugs and other things on the floor that can make you trip. What can I do  with my stairs?  Do not leave any items on the stairs.  Make sure that there are handrails on both sides of the stairs and use them. Fix handrails that are broken or loose. Make sure that handrails are as long as the stairways.  Check any carpeting to make sure that it is firmly attached to the stairs. Fix any carpet that is loose or worn.  Avoid having throw rugs at the top or bottom of the stairs. If you do have throw rugs, attach them to the floor with carpet tape.  Make sure that you have a light switch at the top of the stairs and the bottom of the stairs. If you do not have them, ask someone to add them for you. What else can I do to help prevent falls?  Wear shoes that:  Do not have high heels.  Have rubber bottoms.  Are comfortable and fit you well.  Are closed at the toe. Do not wear sandals.  If you use a  stepladder:  Make sure that it is fully opened. Do not climb a closed stepladder.  Make sure that both sides of the stepladder are locked into place.  Ask someone to hold it for you, if possible.  Clearly mark and make sure that you can see:  Any grab bars or handrails.  First and last steps.  Where the edge of each step is.  Use tools that help you move around (mobility aids) if they are needed. These include:  Canes.  Walkers.  Scooters.  Crutches.  Turn on the lights when you go into a dark area. Replace any light bulbs as soon as they burn out.  Set up your furniture so you have a clear path. Avoid moving your furniture around.  If any of your floors are uneven, fix them.  If there are any pets around you, be aware of where they are.  Review your medicines with your doctor. Some medicines can make you feel dizzy. This can increase your chance of falling. Ask your doctor what other things that you can do to help prevent falls. This information is not intended to replace advice given to you by your health care provider. Make sure you discuss any questions you have with your health care provider. Document Released: 12/31/2008 Document Revised: 08/12/2015 Document Reviewed: 04/10/2014 Elsevier Interactive Patient Education  2017 [employer].

## 2020-03-02 ENCOUNTER — Ambulatory Visit: Payer: Medicare PPO | Admitting: Psychiatry

## 2020-03-09 ENCOUNTER — Ambulatory Visit: Payer: Medicare PPO

## 2020-03-10 ENCOUNTER — Ambulatory Visit (INDEPENDENT_AMBULATORY_CARE_PROVIDER_SITE_OTHER): Payer: Medicare PPO | Admitting: Psychiatry

## 2020-03-10 ENCOUNTER — Encounter: Payer: Self-pay | Admitting: Psychiatry

## 2020-03-10 ENCOUNTER — Ambulatory Visit: Payer: Medicare PPO | Admitting: General Practice

## 2020-03-10 ENCOUNTER — Other Ambulatory Visit: Payer: Self-pay

## 2020-03-10 DIAGNOSIS — I2699 Other pulmonary embolism without acute cor pulmonale: Secondary | ICD-10-CM

## 2020-03-10 DIAGNOSIS — F338 Other recurrent depressive disorders: Secondary | ICD-10-CM

## 2020-03-10 DIAGNOSIS — F4001 Agoraphobia with panic disorder: Secondary | ICD-10-CM

## 2020-03-10 DIAGNOSIS — F411 Generalized anxiety disorder: Secondary | ICD-10-CM

## 2020-03-10 DIAGNOSIS — F331 Major depressive disorder, recurrent, moderate: Secondary | ICD-10-CM | POA: Diagnosis not present

## 2020-03-10 DIAGNOSIS — Z7901 Long term (current) use of anticoagulants: Secondary | ICD-10-CM

## 2020-03-10 DIAGNOSIS — R7989 Other specified abnormal findings of blood chemistry: Secondary | ICD-10-CM

## 2020-03-10 LAB — POCT INR: INR: 3.8 — AB (ref 2.0–3.0)

## 2020-03-10 MED ORDER — VIIBRYD 40 MG PO TABS: 40.0000 mg | ORAL_TABLET | Freq: Every day | ORAL | 1 refills | Status: DC

## 2020-03-10 NOTE — Patient Instructions (Addendum)
Pre visit review using our clinic review tool, if applicable. No additional management support is needed unless otherwise documented below in the visit note.  Skip dosage today (12/22) and then change dosage and take 1 tablet daily except 1/2 tablet on Sunday , Thursday and Saturdays.   Re-check in 3 weeks.

## 2020-03-10 NOTE — Progress Notes (Signed)
[name] [id number] 03/11/1941 79 y.o.  Subjective:   Patient ID:  Andrea Simmons is a 79 y.o. (DOB 04-Oct-1940) female.  Chief Complaint:  Chief Complaint  Patient presents with  . Follow-up  . Depression  . Fatigue    Depression        Associated symptoms include appetite change.  Associated symptoms include no decreased concentration and no suicidal ideas.   Andrea Simmons presents  today for recent exacerbation of depression..    At visit was July 09, 2018.  Vraylar was discontinued for lack of response.  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  This was partially helpful.  When  seen Aug 15, 2018 and Ritalin was increased to 15 mg twice daily because of partial response at the lower dose.  At visit and of July 2020.  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  The Ritalin was increased again to 20 mg twice daily.  seen December 04, 2018 & 02/2019.  Doesn't like winter.  Seasonal depression and crying more.  There were no med changes.   11/17/19 appt with the following noted: BP been higher and seeing Card and having med changes. Stopped Vit D bc level was high. Some discussion about the Ritalin over the BP control. Some increase depression in part over health issues.  No marital problems.  No SI.  Issues with daughter are a stressor.  D said she was mean when D was growing up and was too strict.  Felt D was ungrateful and that she and H did the best they could do.  I can't get over that and has told D about this. Ritalin with less energizing benefit and less productive than she was..  Wears off around supper time.  At night cannot break habit of snacks at night. Does not feel it kick in and wear off.  Back to old habits of staying up too late, now MN.  Needs 7-8 hours.  Always needed more than others.    I feel good taking it.   Most days feels pretty good.  Can have crying spells without reason even before the Ritalin.   Doing much more than I was.   H still sick often too. Plan: Reduce Ritalin to 10 mg twice daily due to loss of benefit and blood pressure  Start change from duloxetine to Trintellix to help depression: Reduce duloxetine to 3 of 30 mg capsules and start Trintellix 5 mg daily for 1 week, Then reduce duloxetine to 2 of the 30 mg capsules and increase Trintellix to 10 mg daily for 1 week, Then increase Trintellix to 10 + 5 mg tablets and reduce duloxetine to 1 of the 30 mg capsules for 1 week, Then stop Duloxetine and increase Trintellix to 20 mg daily (or 2 of the 10 mg tablets)  01/14/2020 appointment with the following noted: Is on Trintellix 20 mg daily since 12/08/19 approximately. Was really hard with the switch.  Got really low and now some better.  Had a lot of aches and fatigue for awhile for 5 weeks.  Wonders if she was sick.  She doesn't feel like Trintellix is the right med. Gained 14# and still on low dose Ritalin.  Still having crying spells and poor energy but some days feels pretty good. No nausea or other SE noticed. A lot  of worry over her health and BP and H's cellulitis again. Plan: Overall patient has not improved with switch from duloxetine to Trintellix.  Her energy is not better and her mood is not better.  She is also quite anxious. Reduce Trintellix to 10 mg 1 daily and start viibryd 10 mg daily for a week, then  Stop Trintellix and increase Viibryd to 20 mg daily with food.  [place] 01/23/20 wanting to stop Ritalin so she did.  01/30/20 TC : LM:  Suggested she restart vitamin D 2000 units daily.  Her vitamin D level is a little low and we would like to see it in the 50s.  It is hoped that dose will push back into the 50s and if there is a question we will recheck it in 3 months. She has not been on Viibryd 20 mg long enough to see the full benefit of that.  She needs to continue  Viibryd 20 mg for at least a full 4 weeks at that dosage so another 3 weeks or so.  At that point we could consider increasing it if needed.   03/10/20 appt with following noted: Ritalin didn't help and might have increased BP per card. On Viibryd about 6 weeks at 20 mg. Read on viibryd  And concerned about taking with coumadin. Gotten where she can't sleep well.  Last week only averaging 3 hours per night.  Also irritable a lot in last 10 days. Gained 17#. Hungry. Worries about things more than in the past and some of it is age. [name] and Monday diarrhea.  Otherwise just at times.  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   Past Psychiatric Medication Trials: Rozerem, zolpidem. Duloxetine 120, Paxil 20 for years, Sertraline, fluoxetine, Wellbutrin,  Trintellix 20 5 weeks NR. Viibryd 20 Abilify weight gain and loss benefit, Was More talkative on the Abilify and the Wellbutrin. lithium 2004 SE tremor at 648m daily.  Low dose nortriptyline, Vraylar NR, Buspar NR.  Review of Systems:  Review of Systems  Constitutional: Positive for appetite change and unexpected weight change.  Cardiovascular: Negative for palpitations.  Gastrointestinal: Positive for diarrhea.  Musculoskeletal: Positive for arthralgias, back pain and joint swelling. Negative for neck stiffness.  Neurological: Negative for tremors, weakness and numbness.  Psychiatric/Behavioral: Positive for depression and dysphoric mood. Negative for agitation, behavioral problems, confusion, decreased concentration, hallucinations, self-injury, sleep disturbance and suicidal ideas. The patient is nervous/anxious. The patient is not hyperactive.    Hx anemia.    Cardiologist said she had a stiff heart.  History of pulmonary embolism and on Coumadin.  Medications: I have reviewed the patient's current medications.  Current Outpatient Medications  Medication Sig Dispense Refill  . Accu-Chek Softclix Lancets lancets Use  as instructed to test sugars BID 100 each 12  . acetaminophen (TYLENOL) 500 MG tablet Take 500 mg by mouth every 6 (six) hours as needed.    .Marland KitchenamLODipine (NORVASC) 10 MG tablet Take 1 tablet (10 mg total) by mouth daily. (Patient taking differently: Take 10 mg by mouth daily. Taking it at night) 90 tablet 2  . BIOTIN PO Take by mouth. LHarney   . Blood Glucose Monitoring Suppl (ACCU-CHEK GUIDE) w/Device KIT 1 each by Does not apply route 2 (two) times daily. To test sugars. Dx. E11.9 1 kit 1  . Choline Fenofibrate (FENOFIBRIC ACID) 135 MG CPDR     . fenofibrate (TRICOR) 48 MG tablet Take 1 tablet  by mouth daily.    . furosemide (LASIX) 20 MG tablet TAKE 1 TABLET BY MOUTH AS NEEDED (Patient taking differently: Take 20 mg by mouth daily. Only takes as needed) 90 tablet 1  . glucose blood (ACCU-CHEK GUIDE) test strip CHECK BLOOD SUGARS DAILY 100 strip 12  . levothyroxine (SYNTHROID) 25 MCG tablet TAKE 1 TABLET BY MOUTH DAILY BEFORE BREAKFAST. 90 tablet 1  . Magnesium 500 MG TABS Take by mouth.    . metFORMIN (GLUCOPHAGE-XR) 500 MG 24 hr tablet Take 500 mg by mouth 2 (two) times daily.   3  . nitroGLYCERIN (NITROSTAT) 0.4 MG SL tablet Place 1 tablet (0.4 mg total) under the tongue every 5 (five) minutes as needed for chest pain. 30 tablet 3  . Omega-3 Fatty Acids (FISH OIL) 500 MG CAPS Take 1 capsule by mouth 2 (two) times daily.    . RESTASIS MULTIDOSE 0.05 % ophthalmic emulsion Place 1 drop into both eyes 2 (two) times daily.   6  . rosuvastatin (CRESTOR) 20 MG tablet Take 20 mg by mouth daily.    . valsartan-hydrochlorothiazide (DIOVAN-HCT) 320-25 MG tablet Take 1 tablet by mouth in the morning. 90 tablet 2  . Vilazodone HCl 20 MG TABS Take 1 tablet (20 mg total) by mouth daily. 30 tablet 1  . warfarin (COUMADIN) 5 MG tablet 43m on Sunday, 2.534mAll other days 70 tablet 2  . methylphenidate (RITALIN) 10 MG tablet Take 1 tablet (10 mg total) by mouth 2 (two) times daily. (Patient not  taking: Reported on 03/10/2020) 180 tablet 0   No current facility-administered medications for this visit.    Medication Side Effects: None talkative more the [facility]  Allergies:  Allergies  Allergen Reactions  . Anaprox [Naproxen Sodium]   . Lipitor [Atorvastatin]   . Morphine   . Meperidine Nausea And Vomiting  . Naproxen Sodium Nausea And Vomiting    Past Medical History:  Diagnosis Date  . Anemia   . Anxiety   . Breast cancer ([facility]  . Depression   . Diabetes mellitus type II   . Fibromyalgia   . GERD (gastroesophageal reflux disease)   . Hyperlipidemia   . Hypertension   . Malignant neoplasm of breast (female), unspecified site 1993   L breast s/p mastectomy and tamoxifen x 5y35yr. 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 Unspecified hypothyroidism     Family History  Problem Relation Age of Onset  . Depression Other        Parent  . Arthritis Other        Parent, Grandparent  . Hypertension Other        Grandparent  . Hyperlipidemia Other        [facility] Miscarriages / Stillbirths Other        Grandparent  . Stroke Other        [facility] Maternal Uncle        prostate  . Hypertension Maternal Grandfather   . Breast cancer Cousin   . Breast cancer Cousin     Social History   Socioeconomic History  . Marital status: Married    Spouse name: Not on file  . Number of children: 1  . Years of education: Not on file  . Highest education level: Not on file  Occupational History  . Occupation: teaArmed forces technical officerETIRED  . Occupation: haiEmergency planning/management officer  Occupation: school bus driver  Tobacco Use  . Smoking status: Never Smoker  . Smokeless tobacco: Never Used  Vaping Use  . Vaping Use: Never used  Substance and Sexual Activity  . Alcohol use: No    Alcohol/week: 0.0 standard drinks  . Drug use: No  . Sexual activity: Not on file  Other Topics Concern  . Not on file  Social History  Narrative  . Not on file   Social Determinants of Health   Financial Resource Strain: Low Risk   . Difficulty of Paying Living Expenses: Not hard at all  Food Insecurity: No Food Insecurity  . Worried About Charity fundraiser in the Last Year: Never true  . Ran Out of Food in the Last Year: Never true  Transportation Needs: No Transportation Needs  . Lack of Transportation (Medical): No  . Lack of Transportation (Non-Medical): No  Physical Activity: Inactive  . Days of Exercise per Week: 0 days  . Minutes of Exercise per Session: 0 min  Stress: No Stress Concern Present  . Feeling of Stress : Only a little  Social Connections: Moderately Integrated  . Frequency of Communication with Friends and Family: More than three times a week  . Frequency of Social Gatherings with Friends and Family: Once a week  . Attends Religious Services: 1 to 4 times per year  . Active Member of Clubs or Organizations: No  . Attends Archivist Meetings: Never  . Marital Status: Married  Human resources officer Violence: Not At Risk  . Fear of Current or Ex-Partner: No  . Emotionally Abused: No  . Physically Abused: No  . Sexually Abused: No    Past Medical History, Surgical history, Social history, and Family history were reviewed and updated as appropriate.   Please see review of systems for further details on the patient's review from today.   Objective:   Physical Exam:  There were no vitals taken for this visit.  Physical Exam Constitutional:      General: She is not in acute distress.    Appearance: She is well-developed.  Musculoskeletal:        General: No deformity.  Neurological:     Mental Status: She is alert and oriented to person, place, and time.     Cranial Nerves: No dysarthria.     Coordination: Coordination normal.  Psychiatric:        Attention and Perception: Attention normal. She is attentive.        Mood and Affect: Mood is anxious and depressed. Affect is  tearful. Affect is not labile, blunt, angry or inappropriate.        Speech: Speech normal.        Behavior: Behavior normal. Behavior is cooperative.        Thought Content: Thought content normal. Thought content is not paranoid or delusional. Thought content does not include homicidal or suicidal ideation. Thought content does not include homicidal or suicidal plan.        Cognition and Memory: Cognition and memory normal.        Judgment: Judgment normal.     Comments: Insight fair-good.    irritability worse and depression Talkative     Lab Review:     Component Value Date/Time   NA 139 01/26/2020 1326   NA 140 05/28/2019 1609   NA 141 08/01/2016 1527   K 4.3 01/26/2020 1326   K 4.6 08/01/2016 1527   CL 105 01/26/2020 1326   CO2  26 01/26/2020 1326   CO2 24 08/01/2016 1527   GLUCOSE 99 01/26/2020 1326   GLUCOSE 93 08/01/2016 1527   BUN 25 (H) 01/26/2020 1326   BUN 17 05/28/2019 1609   BUN 19.9 08/01/2016 1527   CREATININE 0.93 01/26/2020 1326   CREATININE 0.9 08/01/2016 1527   CALCIUM 9.5 01/26/2020 1326   CALCIUM 9.7 08/01/2016 1527   PROT 6.8 01/26/2020 1326   PROT 7.2 08/01/2016 1527   PROT 6.8 08/01/2016 1527   ALBUMIN 4.5 01/26/2020 1326   ALBUMIN 4.0 08/01/2016 1527   AST 38 (H) 01/26/2020 1326   AST 41 (H) 08/01/2016 1527   ALT 30 01/26/2020 1326   ALT 25 08/01/2016 1527   ALKPHOS 44 01/26/2020 1326   ALKPHOS 48 08/01/2016 1527   BILITOT 0.6 01/26/2020 1326   BILITOT 0.33 08/01/2016 1527   GFRNONAA 66 05/28/2019 1609   GFRAA 76 05/28/2019 1609       Component Value Date/Time   WBC 5.1 01/26/2020 1326   RBC 4.03 01/26/2020 1326   HGB 12.5 01/26/2020 1326   HGB 9.5 (L) 08/01/2016 1527   HCT 37.2 01/26/2020 1326   HCT 31.2 (L) 08/01/2016 1527   PLT 195.0 01/26/2020 1326   PLT 221 08/01/2016 1527   MCV 92.4 01/26/2020 1326   MCV 83.9 08/01/2016 1527   MCH 30.4 10/17/2018 1204   MCHC 33.7 01/26/2020 1326   RDW 13.2 01/26/2020 1326   RDW 17.2 (H)  08/01/2016 1527   LYMPHSABS 1.5 01/26/2020 1326   LYMPHSABS 2.0 08/01/2016 1527   MONOABS 0.4 01/26/2020 1326   MONOABS 0.4 08/01/2016 1527   EOSABS 0.1 01/26/2020 1326   EOSABS 0.1 08/01/2016 1527   BASOSABS 0.0 01/26/2020 1326   BASOSABS 0.0 08/01/2016 1527    No results found for: POCLITH, LITHIUM   No results found for: PHENYTOIN, PHENOBARB, VALPROATE, CBMZ   Endo checked thyroid lately.   Vitamin D level on April 01, 2018 reported as 40.  After that vitamin D 50,000 units weekly was prescribed with a goal of achieving levels in the 50s and 60s.  December 04, 2018 vitamin D 25 off supplement .res Assessment: Plan:    Sui was seen today for follow-up, depression and fatigue.  Diagnoses and all orders for this visit:  Major depressive disorder, recurrent episode, moderate (HCC)  Seasonal depression (HCC)  Panic disorder with agoraphobia  Generalized anxiety disorder  Low vitamin D level     Greater than 50% of  30 min face to face time with patient was spent on counseling and coordination of care. We discussed several things.  She is not 100% free of depression she has residual low energy and motivation but her mood is substantially better than it was before. Clear benefit with Ritalin.  She is overall satisfied with the medication.  Discussed potential benefits, risks, and side effects of stimulants with patient to include increased heart rate, palpitations, insomnia, increased anxiety, increased irritability, or decreased appetite.  Instructed patient to contact office if experiencing any significant tolerability issues. She does not appear manic and not sig different from the last visit. Still talkative on it but not manic.  Options pramipexole, Rexulti, Viibryd.  Disc SE and options.    DC Ritalin per her request.  Poor response so far with Viibryd. Increase Viibryd to 30 mg for 1 week, then 40 mg daily.  Option counseling but availability is limited  for options. But on list for [facility]  Repeat vitamin D level was  47.  Previously high.  Restarted 2000 units daily.   Been off supplement since July 2021   FU 6 weeks  Lynder Parents, MD, DFAPA   Please see After Visit Summary for patient specific instructions.  Future Appointments  Date Time Provider [place]  03/10/2020  1:45 PM [facility]  03/18/2020 12:00 PM Shanon Ace, LCSW [facility] None  03/29/2020  1:15 PM Velora Heckler [facility]  06/11/2020  1:30 PM [name], Reynold Bowen, MD [facility] None    No orders of the defined types were placed in this encounter.     -------------------------------

## 2020-03-10 NOTE — Patient Instructions (Signed)
Increase Viibryd to 20 + 10 mg daily for 1 week then increase to 1 of the 40 mg tablets

## 2020-03-13 ENCOUNTER — Emergency Department (HOSPITAL_COMMUNITY)
Admission: EM | Admit: 2020-03-13 | Discharge: 2020-03-14 | Disposition: A | Discharge status: Home / Self Care | Payer: Medicare PPO | Attending: Emergency Medicine | Admitting: Emergency Medicine

## 2020-03-13 ENCOUNTER — Other Ambulatory Visit: Payer: Self-pay

## 2020-03-13 ENCOUNTER — Encounter (HOSPITAL_COMMUNITY): Payer: Self-pay | Admitting: *Deleted

## 2020-03-13 DIAGNOSIS — Z5321 Procedure and treatment not carried out due to patient leaving prior to being seen by health care provider: Secondary | ICD-10-CM | POA: Insufficient documentation

## 2020-03-13 DIAGNOSIS — M542 Cervicalgia: Secondary | ICD-10-CM | POA: Insufficient documentation

## 2020-03-13 NOTE — ED Triage Notes (Signed)
Pt with neck pain on left since yesterday morning, pt with hx of neck issues for years.   Pt has seen ortho for it and has had shots.

## 2020-03-14 ENCOUNTER — Other Ambulatory Visit: Payer: Self-pay

## 2020-03-14 ENCOUNTER — Emergency Department (HOSPITAL_COMMUNITY): Payer: Medicare PPO

## 2020-03-14 ENCOUNTER — Encounter (HOSPITAL_COMMUNITY): Payer: Self-pay | Admitting: Emergency Medicine

## 2020-03-14 ENCOUNTER — Emergency Department (HOSPITAL_COMMUNITY)
Admission: EM | Admit: 2020-03-14 | Discharge: 2020-03-14 | Disposition: A | Discharge status: Home / Self Care | Payer: Medicare PPO | Source: Home / Self Care

## 2020-03-14 DIAGNOSIS — M25512 Pain in left shoulder: Secondary | ICD-10-CM | POA: Insufficient documentation

## 2020-03-14 DIAGNOSIS — Z9889 Other specified postprocedural states: Secondary | ICD-10-CM | POA: Diagnosis not present

## 2020-03-14 DIAGNOSIS — M25522 Pain in left elbow: Secondary | ICD-10-CM | POA: Insufficient documentation

## 2020-03-14 DIAGNOSIS — Z6824 Body mass index (BMI) 24.0-24.9, adult: Secondary | ICD-10-CM | POA: Diagnosis not present

## 2020-03-14 DIAGNOSIS — M542 Cervicalgia: Secondary | ICD-10-CM | POA: Diagnosis not present

## 2020-03-14 DIAGNOSIS — Z5321 Procedure and treatment not carried out due to patient leaving prior to being seen by health care provider: Secondary | ICD-10-CM | POA: Insufficient documentation

## 2020-03-14 DIAGNOSIS — M436 Torticollis: Secondary | ICD-10-CM | POA: Diagnosis not present

## 2020-03-14 DIAGNOSIS — M6289 Other specified disorders of muscle: Secondary | ICD-10-CM | POA: Diagnosis not present

## 2020-03-14 LAB — CBG MONITORING, ED: Glucose-Capillary: 95 mg/dL (ref 70–99)

## 2020-03-14 NOTE — ED Notes (Signed)
This RN was informed that patient left and is at another hospital. D/t pt leaving, I was unable to get a final set of vitals on this patient.

## 2020-03-14 NOTE — ED Notes (Signed)
Went to round on patient and she was not in her room. Pt has removed BP cuff and detached herself from the monitor. Will check to see if patient returns.

## 2020-03-14 NOTE — ED Notes (Signed)
Pt callled 3x no answer

## 2020-03-14 NOTE — ED Triage Notes (Signed)
Patient reports left lateral neck pain radiating to left shoulder/left elbow this evening , no injury , denies chest pain or SOB .

## 2020-03-18 ENCOUNTER — Ambulatory Visit: Payer: Medicare PPO | Admitting: Psychiatry

## 2020-03-19 DIAGNOSIS — I10 Essential (primary) hypertension: Secondary | ICD-10-CM | POA: Diagnosis not present

## 2020-03-23 ENCOUNTER — Other Ambulatory Visit: Payer: Self-pay | Admitting: Psychiatry

## 2020-03-29 ENCOUNTER — Other Ambulatory Visit: Payer: Self-pay

## 2020-03-29 ENCOUNTER — Ambulatory Visit (INDEPENDENT_AMBULATORY_CARE_PROVIDER_SITE_OTHER): Payer: Medicare PPO | Admitting: Orthotics

## 2020-03-29 DIAGNOSIS — M778 Other enthesopathies, not elsewhere classified: Secondary | ICD-10-CM

## 2020-03-29 DIAGNOSIS — M2022 Hallux rigidus, left foot: Secondary | ICD-10-CM

## 2020-03-29 DIAGNOSIS — E1149 Type 2 diabetes mellitus with other diabetic neurological complication: Secondary | ICD-10-CM | POA: Diagnosis not present

## 2020-03-29 NOTE — Progress Notes (Signed)

## 2020-03-30 ENCOUNTER — Ambulatory Visit (INDEPENDENT_AMBULATORY_CARE_PROVIDER_SITE_OTHER): Payer: Medicare PPO | Admitting: Psychiatry

## 2020-03-30 DIAGNOSIS — F331 Major depressive disorder, recurrent, moderate: Secondary | ICD-10-CM | POA: Diagnosis not present

## 2020-03-30 NOTE — Progress Notes (Signed)
[facility] Counselor/Therapist Progress Note  Patient ID: Andrea Simmons, MRN: [id number],    Date: 03/30/2020  Time Spent: 50 minutes   4:55pm to 5:45pm  Treatment Type: Individual Therapy  Reported Symptoms: depression "and I had a hard time during the holidays" including tearfulness and feeling sad. Issues with her daughter that are hurtful as she has "pulled away" some. Some anxiety.Very talkative, with some ruminating.   Mental Status Exam:  Appearance:   Neat     Behavior:  Appropriate, Sharing and Motivated  Motor:  Normal  Speech/Language:   Clear and Coherent  Affect:  depressed, some tearfulness  Mood:  depressed, sad and some anxiety  Thought process:  some tangentiality  Thought content:    some ruminating, and very talkative  Sensory/Perceptual disturbances:    WNL  Orientation:  oriented to person, place, time/date, situation, day of week, month of year and year  Attention:  Good  Concentration:  Good and Fair  Memory:  some forgetfulness and worse under stress  Fund of knowledge:   Good  Insight:    Fair  Judgment:   Good  Impulse Control:  Good   Risk Assessment: Danger to Self:  No Self-injurious Behavior: No Danger to Others: No Duty to Warn:no Physical Aggression / Violence:No  Access to Firearms a concern: No  Gang Involvement:No   Subjective: Patient today reporting symptoms of depression and some anxiety. Had a hard time during the holidays due to sadness and depression.    Interventions: Solution-Oriented/Positive Psychology and Ego-Supportive  Diagnosis:   ICD-10-CM   1. Major depressive disorder, recurrent episode, moderate ([facility])  F33.1     Plan of Care: Patient not signing treatment plan on computer screen due to Covid.  Treatment Goals: Goals remain on tx plan as patient works with strategies to achieve her goals.  Long term goal: Develop healthy cognitive beliefs about self and the world that lead to alleviation of  depression and help prevent relapse of depression.  Short term goal: Identify and replace depressive thinking that leads to depressive feelings and actions.  Strategies: Replace negative self-defeating self-talk with verbalization of realistic and positive messages.   Progress: Patient in today reporting increased depression mostly during the holidays and "is still happening some now but holidays were worse.  Talkative. Tearful. Referring to multiple different circumstances and events from her past that "remind me of hard times and sadness" including "hard times and my mama died close to Christmas years ago but my grandma raised me." Shared her mom had "mental health issues" so grandmother raised her. Tearfully processed some of the issues that have caused her sadness over time and "it's hard not to think about it especially at holidays." States" I have always tended to struggle with the late Fall and Winter and do some better in Spring and Summer, and having a lot of emotional problems during the holidays and it's that way every year." Describes how she has struggled with her OA and Fibromyalgia recently "and that affects my mood sometimes." Very difficult for patient to focus on the present and anything that is more positive for any length of time, before reverting back to various things that happened in the past re: deaths and other sad events. Reviewed short term goal and strategy in tx plan above in patient's need to recognize depressive thought patterns and replace with more realistic and positive thoughts, and also reduce her negative self-defeating self-talk. Due to her issues with "darker months",  patient was encouraged again to add more light inside her home and leaving blinds open during the daytime, also having frequent contact with other via phone or visits as she seems to enjoy having contact with others.   Goal review and progress/challenges noted with patient.  Next appointment  within 3 weeks.   Shanon Ace, LCSW

## 2020-03-31 ENCOUNTER — Other Ambulatory Visit: Payer: Self-pay

## 2020-03-31 ENCOUNTER — Ambulatory Visit: Payer: Medicare PPO | Admitting: General Practice

## 2020-03-31 DIAGNOSIS — Z7901 Long term (current) use of anticoagulants: Secondary | ICD-10-CM

## 2020-03-31 DIAGNOSIS — E1165 Type 2 diabetes mellitus with hyperglycemia: Secondary | ICD-10-CM | POA: Diagnosis not present

## 2020-03-31 DIAGNOSIS — I2699 Other pulmonary embolism without acute cor pulmonale: Secondary | ICD-10-CM | POA: Diagnosis not present

## 2020-03-31 DIAGNOSIS — E7800 Pure hypercholesterolemia, unspecified: Secondary | ICD-10-CM | POA: Diagnosis not present

## 2020-03-31 DIAGNOSIS — E039 Hypothyroidism, unspecified: Secondary | ICD-10-CM | POA: Diagnosis not present

## 2020-03-31 LAB — POCT INR: INR: 2.7 (ref 2.0–3.0)

## 2020-03-31 NOTE — Patient Instructions (Addendum)
Pre visit review using our clinic review tool, if applicable. No additional management support is needed unless otherwise documented below in the visit note.  Continue to take 1 tablet daily except 1/2 tablet on Sunday , Thursday and Saturdays.   Re-check in 4 weeks.

## 2020-03-31 NOTE — Progress Notes (Signed)
I have reviewed the results and agree with this plan   

## 2020-04-08 DIAGNOSIS — E1165 Type 2 diabetes mellitus with hyperglycemia: Secondary | ICD-10-CM | POA: Diagnosis not present

## 2020-04-08 DIAGNOSIS — G609 Hereditary and idiopathic neuropathy, unspecified: Secondary | ICD-10-CM | POA: Diagnosis not present

## 2020-04-08 DIAGNOSIS — I10 Essential (primary) hypertension: Secondary | ICD-10-CM | POA: Diagnosis not present

## 2020-04-08 DIAGNOSIS — E7800 Pure hypercholesterolemia, unspecified: Secondary | ICD-10-CM | POA: Diagnosis not present

## 2020-04-08 DIAGNOSIS — E039 Hypothyroidism, unspecified: Secondary | ICD-10-CM | POA: Diagnosis not present

## 2020-04-08 DIAGNOSIS — M810 Age-related osteoporosis without current pathological fracture: Secondary | ICD-10-CM | POA: Diagnosis not present

## 2020-04-19 DIAGNOSIS — I10 Essential (primary) hypertension: Secondary | ICD-10-CM | POA: Diagnosis not present

## 2020-04-27 ENCOUNTER — Other Ambulatory Visit: Payer: Self-pay | Admitting: Family Medicine

## 2020-04-28 ENCOUNTER — Ambulatory Visit: Payer: Medicare PPO | Admitting: General Practice

## 2020-04-28 ENCOUNTER — Other Ambulatory Visit: Payer: Self-pay

## 2020-04-28 ENCOUNTER — Encounter: Payer: Self-pay | Admitting: Psychiatry

## 2020-04-28 ENCOUNTER — Ambulatory Visit (INDEPENDENT_AMBULATORY_CARE_PROVIDER_SITE_OTHER): Payer: Medicare PPO | Admitting: Psychiatry

## 2020-04-28 DIAGNOSIS — F4001 Agoraphobia with panic disorder: Secondary | ICD-10-CM | POA: Diagnosis not present

## 2020-04-28 DIAGNOSIS — F411 Generalized anxiety disorder: Secondary | ICD-10-CM

## 2020-04-28 DIAGNOSIS — F331 Major depressive disorder, recurrent, moderate: Secondary | ICD-10-CM

## 2020-04-28 DIAGNOSIS — F338 Other recurrent depressive disorders: Secondary | ICD-10-CM | POA: Diagnosis not present

## 2020-04-28 DIAGNOSIS — Z7901 Long term (current) use of anticoagulants: Secondary | ICD-10-CM

## 2020-04-28 DIAGNOSIS — I2699 Other pulmonary embolism without acute cor pulmonale: Secondary | ICD-10-CM

## 2020-04-28 DIAGNOSIS — F5105 Insomnia due to other mental disorder: Secondary | ICD-10-CM | POA: Diagnosis not present

## 2020-04-28 LAB — POCT INR: INR: 2.8 (ref 2.0–3.0)

## 2020-04-28 MED ORDER — TRAZODONE HCL 50 MG PO TABS: ORAL_TABLET | ORAL | 1 refills | Status: DC

## 2020-04-28 MED ORDER — VIIBRYD 40 MG PO TABS: 40.0000 mg | ORAL_TABLET | Freq: Every day | ORAL | 1 refills | Status: DC

## 2020-04-28 NOTE — Patient Instructions (Signed)
Pre visit review using our clinic review tool, if applicable. No additional management support is needed unless otherwise documented below in the visit note.  Continue to take 1 tablet daily except 1/2 tablet on Sunday , Thursday and Saturdays.   Re-check in 6 weeks.

## 2020-04-28 NOTE — Progress Notes (Signed)
Andrea Simmons [id number] February 03, 1941 80 y.o.  Subjective:   Patient ID:  Andrea Simmons is a 80 y.o. (DOB [birth year]) female.  Chief Complaint:  Chief Complaint  Patient presents with  . Follow-up  . Major depressive disorder, recurrent episode, moderate (HCC)    Depression        Associated symptoms include appetite change.  Associated symptoms include no decreased concentration and no suicidal ideas.   Andrea Simmons presents  today for recent exacerbation of depression..    At visit was July 09, 2018.  Vraylar was discontinued for lack of response.  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  This was partially helpful.  When  seen Aug 15, 2018 and Ritalin was increased to 15 mg twice daily because of partial response at the lower dose.  At visit and of July 2020.  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  The Ritalin was increased again to 20 mg twice daily.  seen December 04, 2018 & 02/2019.  Doesn't like winter.  Seasonal depression and crying more.  There were no med changes.   11/17/19 appt with the following noted: BP been higher and seeing Card and having med changes. Stopped Vit D bc level was high. Some discussion about the Ritalin over the BP control. Some increase depression in part over health issues.  No marital problems.  No SI.  Issues with daughter are a stressor.  D said she was mean when D was growing up and was too strict.  Felt D was ungrateful and that she and H did the best they could do.  I can't get over that and has told D about this. Ritalin with less energizing benefit and less productive than she was..  Wears off around supper time.  At night cannot break habit of snacks at night. Does not feel it kick in and wear off.  Back to old habits of staying up too late, now MN.  Needs 7-8 hours.  Always needed more than  others.    I feel good taking it.  Most days feels pretty good.  Can have crying spells without reason even before the Ritalin.   Doing much more than I was.   H still sick often too. Plan: Reduce Ritalin to 10 mg twice daily due to loss of benefit and blood pressure  Start change from duloxetine to Trintellix to help depression: Reduce duloxetine to 3 of 30 mg capsules and start Trintellix 5 mg daily for 1 week, Then reduce duloxetine to 2 of the 30 mg capsules and increase Trintellix to 10 mg daily for 1 week, Then increase Trintellix to 10 + 5 mg tablets and reduce duloxetine to 1 of the 30 mg capsules for 1 week, Then stop Duloxetine and increase Trintellix to 20 mg daily (or 2 of the 10 mg tablets)  01/14/2020 appointment with the following noted: Is on Trintellix 20 mg daily since 12/08/19 approximately. Was really hard with the switch.  Got really low and now some better.  Had a lot of aches and fatigue for awhile for 5 weeks.  Wonders if she was sick.  She doesn't feel like Trintellix is the right med. Gained 14# and still on low dose Ritalin.  Still having crying spells and poor energy but some days feels pretty good. No nausea or other SE  noticed. A lot of worry over her health and BP and H's cellulitis again. Plan: Overall patient has not improved with switch from duloxetine to Trintellix.  Her energy is not better and her mood is not better.  She is also quite anxious. Reduce Trintellix to 10 mg 1 daily and start viibryd 10 mg daily for a week, then  Stop Trintellix and increase Viibryd to 20 mg daily with food.  Barry 01/23/20 wanting to stop Ritalin so she did.  01/30/20 TC : LM:  Suggested she restart vitamin D 2000 units daily.  Her vitamin D level is a little low and we would like to see it in the 50s.  It is hoped that dose will push back into the 50s and if there is a question we will recheck it in 3 months. She has not been on Viibryd 20 mg long enough to see the full benefit of  that.  She needs to continue Viibryd 20 mg for at least a full 4 weeks at that dosage so another 3 weeks or so.  At that point we could consider increasing it if needed.   03/10/20 appt with following noted: Ritalin didn't help and might have increased BP per card. On Viibryd about 6 weeks at 20 mg. Read on viibryd  And concerned about taking with coumadin. Gotten where she can't sleep well.  Last week only averaging 3 hours per night.  Also irritable a lot in last 10 days. Gained 17#. Hungry. Worries about things more than in the past and some of it is age. [name] and Monday diarrhea.  Otherwise just at times. Plan: DC Ritalin per her request. Poor response so far with Viibryd. Increase Viibryd to 30 mg for 1 week, then 40 mg daily.  04/28/2020 appt with following noted: A lot better but far from where I need to be. Still depressed. Can't sleep.  Catnap.  Taking viibryd in morning 40 mg for 5 weeks. Increase Viibryd to 40 mg daily.   3-4 hours sleep and no naps.  Not drowsy daytime. Ongoing tiredness chronically. Feels more tense and irritable from not sleeping.  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   Past Psychiatric Medication Trials: Rozerem, zolpidem,  Duloxetine 120, Paxil 20 for years, Sertraline, fluoxetine, Wellbutrin,  Trintellix 20 5 weeks NR. Viibryd 20 Abilify weight gain and loss benefit, Was More talkative on the Abilify and the Wellbutrin. lithium 2004 SE tremor at 6101m daily.  Low dose nortriptyline, Vraylar NR, Buspar NR.  Review of Systems:  Review of Systems  Constitutional: Positive for appetite change and unexpected weight change.  Cardiovascular: Negative for palpitations.  Gastrointestinal: Negative for diarrhea.  Musculoskeletal: Positive for arthralgias, back pain and joint swelling. Negative for neck stiffness.  Neurological: Negative for tremors, weakness and numbness.  Psychiatric/Behavioral: Positive for depression and dysphoric mood.  Negative for agitation, behavioral problems, confusion, decreased concentration, hallucinations, self-injury, sleep disturbance and suicidal ideas. The patient is nervous/anxious. The patient is not hyperactive.    Hx anemia.    Cardiologist said she had a stiff heart.  History of pulmonary embolism and on Coumadin.  Medications: I have reviewed the patient's current medications.  Current Outpatient Medications  Medication Sig Dispense Refill  . Accu-Chek Softclix Lancets lancets Use as instructed to test sugars BID 100 each 12  . acetaminophen (TYLENOL) 500 MG tablet Take 500 mg by mouth every 6 (six) hours as needed.    .Marland KitchenamLODipine (NORVASC) 10 MG tablet  Take 1 tablet (10 mg total) by mouth daily. (Patient taking differently: Take 10 mg by mouth daily. Taking it at night) 90 tablet 2  . BIOTIN PO Take by mouth. [place]    . Blood Glucose Monitoring Suppl (ACCU-CHEK GUIDE) w/Device KIT 1 each by Does not apply route 2 (two) times daily. To test sugars. Dx. E11.9 1 kit 1  . Choline Fenofibrate (FENOFIBRIC ACID) 135 MG CPDR     . fenofibrate (TRICOR) 48 MG tablet Take 1 tablet by mouth daily.    . furosemide (LASIX) 20 MG tablet TAKE 1 TABLET BY MOUTH AS NEEDED (Patient taking differently: Take 20 mg by mouth daily. Only takes as needed) 90 tablet 1  . glucose blood (ACCU-CHEK GUIDE) test strip CHECK BLOOD SUGARS DAILY 100 strip 12  . levothyroxine (SYNTHROID) 25 MCG tablet TAKE 1 TABLET BY MOUTH EVERY DAY BEFORE BREAKFAST 90 tablet 1  . Magnesium 500 MG TABS Take by mouth.    . metFORMIN (GLUCOPHAGE-XR) 500 MG 24 hr tablet Take 500 mg by mouth 2 (two) times daily.   3  . nitroGLYCERIN (NITROSTAT) 0.4 MG SL tablet Place 1 tablet (0.4 mg total) under the tongue every 5 (five) minutes as needed for chest pain. 30 tablet 3  . Omega-3 Fatty Acids (FISH OIL) 500 MG CAPS Take 1 capsule by mouth 2 (two) times daily.    . RESTASIS MULTIDOSE 0.05 % ophthalmic emulsion Place 1 drop into  both eyes 2 (two) times daily.   6  . rosuvastatin (CRESTOR) 20 MG tablet Take 20 mg by mouth daily.    . traZODone (DESYREL) 50 MG tablet 1-2 tablets at night for sleep 60 tablet 1  . valsartan-hydrochlorothiazide (DIOVAN-HCT) 320-25 MG tablet Take 1 tablet by mouth in the morning. 90 tablet 2  . Vilazodone HCl (VIIBRYD) 40 MG TABS Take 1 tablet (40 mg total) by mouth daily. 30 tablet 1  . Vitamin D, Ergocalciferol, (DRISDOL) 1.25 MG (50000 UNIT) CAPS capsule TAKE 1 CAPSULE BY MOUTH EVERY 7 DAYS. 15 capsule 0  . warfarin (COUMADIN) 5 MG tablet 42m on Sunday, 2.582mAll other days 70 tablet 2   No current facility-administered medications for this visit.    Medication Side Effects: None talkative more the [facility]  Allergies:  Allergies  Allergen Reactions  . Anaprox [Naproxen Sodium]   . Lipitor [Atorvastatin]   . Morphine   . Meperidine Nausea And Vomiting  . Naproxen Sodium Nausea And Vomiting    Past Medical History:  Diagnosis Date  . Anemia   . Anxiety   . Breast cancer ([place]  . Depression   . Diabetes mellitus type II   . Fibromyalgia   . GERD (gastroesophageal reflux disease)   . Hyperlipidemia   . Hypertension   . Malignant neoplasm of breast (female), unspecified site 1993   L breast s/p mastectomy and tamoxifen x 5y63yr. 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 Unspecified hypothyroidism     Family History  Problem Relation Age of Onset  . Depression Other        Parent  . Arthritis Other        Parent, Grandparent  . Hypertension Other        Grandparent  . Hyperlipidemia Other        [facility] Miscarriages / Stillbirths Other        Grandparent  . Stroke Other  [facility]  . Cancer Maternal Uncle        prostate  . Hypertension Maternal Grandfather   . Breast cancer Cousin   . Breast cancer Cousin     Social History   Socioeconomic History  . Marital status: Married    Spouse name: Not on file  . Number  of children: 1  . Years of education: Not on file  . Highest education level: Not on file  Occupational History  . Occupation: Armed forces technical officer: RETIRED  . Occupation: Emergency planning/management officer  . Occupation: school bus driver  Tobacco Use  . Smoking status: Never Smoker  . Smokeless tobacco: Never Used  Vaping Use  . Vaping Use: Never used  Substance and Sexual Activity  . Alcohol use: No    Alcohol/week: 0.0 standard drinks  . Drug use: No  . Sexual activity: Not on file  Other Topics Concern  . Not on file  Social History Narrative  . Not on file   Social Determinants of Health   Financial Resource Strain: Low Risk   . Difficulty of Paying Living Expenses: Not hard at all  Food Insecurity: No Food Insecurity  . Worried About Charity fundraiser in the Last Year: Never true  . Ran Out of Food in the Last Year: Never true  Transportation Needs: No Transportation Needs  . Lack of Transportation (Medical): No  . Lack of Transportation (Non-Medical): No  Physical Activity: Inactive  . Days of Exercise per Week: 0 days  . Minutes of Exercise per Session: 0 min  Stress: No Stress Concern Present  . Feeling of Stress : Only a little  Social Connections: Moderately Integrated  . Frequency of Communication with Friends and Family: More than three times a week  . Frequency of Social Gatherings with Friends and Family: Once a week  . Attends Religious Services: 1 to 4 times per year  . Active Member of Clubs or Organizations: No  . Attends Archivist Meetings: Never  . Marital Status: Married  Human resources officer Violence: Not At Risk  . Fear of Current or Ex-Partner: No  . Emotionally Abused: No  . Physically Abused: No  . Sexually Abused: No    Past Medical History, Surgical history, Social history, and Family history were reviewed and updated as appropriate.   Please see review of systems for further details on the patient's review from today.   Objective:    Physical Exam:  There were no vitals taken for this visit.  Physical Exam Constitutional:      General: She is not in acute distress.    Appearance: She is well-developed.  Musculoskeletal:        General: No deformity.  Neurological:     Mental Status: She is alert and oriented to person, place, and time.     Cranial Nerves: No dysarthria.     Coordination: Coordination normal.  Psychiatric:        Attention and Perception: Attention normal. She is attentive.        Mood and Affect: Mood is anxious and depressed. Affect is not labile, blunt, angry, tearful or inappropriate.        Speech: Speech normal.        Behavior: Behavior normal. Behavior is cooperative.        Thought Content: Thought content normal. Thought content is not paranoid or delusional. Thought content does not include homicidal or suicidal ideation. Thought content does not include homicidal  or suicidal plan.        Cognition and Memory: Cognition and memory normal.        Judgment: Judgment normal.     Comments: Insight fair-good.   Depression is improved but not resolved Talkative     Lab Review:     Component Value Date/Time   NA 139 01/26/2020 1326   NA 140 05/28/2019 1609   NA 141 08/01/2016 1527   K 4.3 01/26/2020 1326   K 4.6 08/01/2016 1527   CL 105 01/26/2020 1326   CO2 26 01/26/2020 1326   CO2 24 08/01/2016 1527   GLUCOSE 99 01/26/2020 1326   GLUCOSE 93 08/01/2016 1527   BUN 25 (H) 01/26/2020 1326   BUN 17 05/28/2019 1609   BUN 19.9 08/01/2016 1527   CREATININE 0.93 01/26/2020 1326   CREATININE 0.9 08/01/2016 1527   CALCIUM 9.5 01/26/2020 1326   CALCIUM 9.7 08/01/2016 1527   PROT 6.8 01/26/2020 1326   PROT 7.2 08/01/2016 1527   PROT 6.8 08/01/2016 1527   ALBUMIN 4.5 01/26/2020 1326   ALBUMIN 4.0 08/01/2016 1527   AST 38 (H) 01/26/2020 1326   AST 41 (H) 08/01/2016 1527   ALT 30 01/26/2020 1326   ALT 25 08/01/2016 1527   ALKPHOS 44 01/26/2020 1326   ALKPHOS 48 08/01/2016 1527    BILITOT 0.6 01/26/2020 1326   BILITOT 0.33 08/01/2016 1527   GFRNONAA 66 05/28/2019 1609   GFRAA 76 05/28/2019 1609       Component Value Date/Time   WBC 5.1 01/26/2020 1326   RBC 4.03 01/26/2020 1326   HGB 12.5 01/26/2020 1326   HGB 9.5 (L) 08/01/2016 1527   HCT 37.2 01/26/2020 1326   HCT 31.2 (L) 08/01/2016 1527   PLT 195.0 01/26/2020 1326   PLT 221 08/01/2016 1527   MCV 92.4 01/26/2020 1326   MCV 83.9 08/01/2016 1527   MCH 30.4 10/17/2018 1204   MCHC 33.7 01/26/2020 1326   RDW 13.2 01/26/2020 1326   RDW 17.2 (H) 08/01/2016 1527   LYMPHSABS 1.5 01/26/2020 1326   LYMPHSABS 2.0 08/01/2016 1527   MONOABS 0.4 01/26/2020 1326   MONOABS 0.4 08/01/2016 1527   EOSABS 0.1 01/26/2020 1326   EOSABS 0.1 08/01/2016 1527   BASOSABS 0.0 01/26/2020 1326   BASOSABS 0.0 08/01/2016 1527    No results found for: POCLITH, LITHIUM   No results found for: PHENYTOIN, PHENOBARB, VALPROATE, CBMZ   Endo checked thyroid lately.   Vitamin D level on April 01, 2018 reported as 40.  After that vitamin D 50,000 units weekly was prescribed with a goal of achieving levels in the 50s and 60s.  December 04, 2018 vitamin D 25 off supplement .res Assessment: Plan:    Ailin was seen today for follow-up and major depressive disorder, recurrent episode, moderate (hcc).  Diagnoses and all orders for this visit:  Major depressive disorder, recurrent episode, moderate (HCC)  Seasonal depression ([place])  Insomnia due to mental condition -     traZODone (DESYREL) 50 MG tablet; 1-2 tablets at night for sleep     Greater than 50% of  30 min face to face time with patient was spent on counseling and coordination of care. We discussed several things.  She is seeing some improvement in the depression on Viibryd 40 mg but is still depressed and having a great deal of insomnia.  We discussed options for dealing with this.  She is tolerating the Viibryd.  It really needs longer to have full  effect.  Discussed potential benefits, risks, and side effects of stimulants with patient to include increased heart rate, palpitations, insomnia, increased anxiety, increased irritability, or decreased appetite.  Instructed patient to contact office if experiencing any significant tolerability issues. She does not appear manic and not sig different from the last visit. Still talkative on it but not manic.  Options pramipexole, Rexulti, Viibryd.  Disc SE and options.   Will add Rexulti next time if not better.  Better partial response so far with Viibryd. Viibryd 40 mg daily.  Trazodone 50-100 mg HS added for insomnia  If fails then mirtazapine  Option counseling but availability is limited for options. But on list for [facility]  Repeat vitamin D level was 47.  Previously high.  Restarted 2000 units daily.   Been off supplement since July 2021   FU 6 weeks  Lynder Parents, MD, DFAPA   Please see After Visit Summary for patient specific instructions.  Future Appointments  Date Time Provider [place]  04/28/2020  2:00 PM [facility] COUMADIN [facility] PEC  05/06/2020  1:00 PM Shanon Ace, LCSW CP-CP None  06/11/2020  1:30 PM [name], Reynold Bowen, MD PCV-PCV None  08/06/2020  1:30 PM [facility] DX DEXA 1 [facility]    No orders of the defined types were placed in this encounter.     -------------------------------

## 2020-04-28 NOTE — Progress Notes (Signed)
I have reviewed the results and agree with this plan  ° °Roxie Kreeger, NP ° °

## 2020-05-06 ENCOUNTER — Ambulatory Visit: Payer: Medicare PPO | Admitting: Psychiatry

## 2020-05-10 DIAGNOSIS — L570 Actinic keratosis: Secondary | ICD-10-CM | POA: Diagnosis not present

## 2020-05-10 DIAGNOSIS — D485 Neoplasm of uncertain behavior of skin: Secondary | ICD-10-CM | POA: Diagnosis not present

## 2020-05-10 DIAGNOSIS — B078 Other viral warts: Secondary | ICD-10-CM | POA: Diagnosis not present

## 2020-05-10 DIAGNOSIS — L218 Other seborrheic dermatitis: Secondary | ICD-10-CM | POA: Diagnosis not present

## 2020-05-10 DIAGNOSIS — C44219 Basal cell carcinoma of skin of left ear and external auricular canal: Secondary | ICD-10-CM | POA: Diagnosis not present

## 2020-05-10 DIAGNOSIS — Z85828 Personal history of other malignant neoplasm of skin: Secondary | ICD-10-CM | POA: Diagnosis not present

## 2020-05-13 ENCOUNTER — Other Ambulatory Visit: Payer: Self-pay | Admitting: Cardiology

## 2020-05-13 DIAGNOSIS — I10 Essential (primary) hypertension: Secondary | ICD-10-CM

## 2020-05-15 ENCOUNTER — Other Ambulatory Visit: Payer: Self-pay | Admitting: Cardiology

## 2020-05-15 DIAGNOSIS — I10 Essential (primary) hypertension: Secondary | ICD-10-CM

## 2020-05-20 DIAGNOSIS — I10 Essential (primary) hypertension: Secondary | ICD-10-CM | POA: Diagnosis not present

## 2020-05-27 ENCOUNTER — Ambulatory Visit: Payer: Medicare PPO | Admitting: Psychiatry

## 2020-06-09 ENCOUNTER — Ambulatory Visit: Payer: Medicare PPO | Admitting: General Practice

## 2020-06-09 ENCOUNTER — Other Ambulatory Visit: Payer: Self-pay

## 2020-06-09 DIAGNOSIS — I2699 Other pulmonary embolism without acute cor pulmonale: Secondary | ICD-10-CM

## 2020-06-09 DIAGNOSIS — Z7901 Long term (current) use of anticoagulants: Secondary | ICD-10-CM

## 2020-06-09 LAB — POCT INR: INR: 2.6 (ref 2.0–3.0)

## 2020-06-09 NOTE — Progress Notes (Signed)
I have reviewed the results and agree with this plan  ° °Loriana Samad, NP ° °

## 2020-06-09 NOTE — Patient Instructions (Addendum)
Pre visit review using our clinic review tool, if applicable. No additional management support is needed unless otherwise documented below in the visit note.  Continue to take 1 tablet daily except 1/2 tablet on Sunday , Thursday and Saturdays.   Re-check in 6 weeks.

## 2020-06-11 ENCOUNTER — Encounter: Payer: Self-pay | Admitting: Cardiology

## 2020-06-11 ENCOUNTER — Other Ambulatory Visit: Payer: Self-pay

## 2020-06-11 ENCOUNTER — Ambulatory Visit: Payer: Medicare PPO | Admitting: Cardiology

## 2020-06-11 VITALS — BP 128/74 | HR 78 | Temp 98.7°F | Resp 16 | Ht 64.0 in | Wt 147.0 lb

## 2020-06-11 DIAGNOSIS — R600 Localized edema: Secondary | ICD-10-CM

## 2020-06-11 DIAGNOSIS — R0600 Dyspnea, unspecified: Secondary | ICD-10-CM

## 2020-06-11 DIAGNOSIS — I10 Essential (primary) hypertension: Secondary | ICD-10-CM | POA: Diagnosis not present

## 2020-06-11 MED ORDER — METOPROLOL SUCCINATE ER 25 MG PO TB24: 25.0000 mg | ORAL_TABLET | Freq: Every day | ORAL | 2 refills | Status: DC

## 2020-06-11 NOTE — Progress Notes (Signed)
Follow up visit  Subjective:   Andrea Simmons, female    DOB: 10/28/1940, 80 y.o.   MRN: [id number]   Chief Complaint  Patient presents with  . Shortness of Breath  . Hypertension  . 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 Blood pressure controlled today, but remains elevated, as noted on home monitoring log. Patient is dealing with depression, sees a psychiatrist regularly.   Current Outpatient Medications on File Prior to Visit  Medication Sig Dispense Refill  . Accu-Chek Softclix Lancets lancets Use as instructed to test sugars BID 100 each 12  . acetaminophen (TYLENOL) 500 MG tablet Take 500 mg by mouth every 6 (six) hours as needed.    Marland Kitchen amLODipine (NORVASC) 10 MG tablet TAKE 1 TABLET BY MOUTH EVERY DAY 90 tablet 2  . BIOTIN PO Take by mouth. [place]    . Blood Glucose Monitoring Suppl (ACCU-CHEK GUIDE) w/Device KIT 1 each by Does not apply route 2 (two) times daily. To test sugars. Dx. E11.9 1 kit 1  . Choline Fenofibrate (FENOFIBRIC ACID) 135 MG CPDR     . fenofibrate (TRICOR) 48 MG tablet Take 1 tablet by mouth daily.    . furosemide (LASIX) 20 MG tablet TAKE 1 TABLET BY MOUTH AS NEEDED (Patient taking differently: Take 20 mg by mouth daily. Only takes as needed) 90 tablet 1  . glucose blood (ACCU-CHEK GUIDE) test strip CHECK BLOOD SUGARS DAILY 100 strip 12  . levothyroxine (SYNTHROID) 25 MCG tablet TAKE 1 TABLET BY MOUTH EVERY DAY BEFORE BREAKFAST 90 tablet 1  . Magnesium 500 MG TABS Take by mouth.    . metFORMIN (GLUCOPHAGE-XR) 500 MG 24 hr tablet Take 500 mg by mouth 2 (two) times daily.   3  . nitroGLYCERIN (NITROSTAT) 0.4 MG SL tablet Place 1 tablet (0.4 mg total) under the tongue every 5 (five) minutes as needed for  chest pain. 30 tablet 3  . Omega-3 Fatty Acids (FISH OIL) 500 MG CAPS Take 1 capsule by mouth 2 (two) times daily.    . RESTASIS MULTIDOSE 0.05 % ophthalmic emulsion Place 1 drop into both eyes 2 (two) times daily.   6  . rosuvastatin (CRESTOR) 20 MG tablet Take 20 mg by mouth daily.    . traZODone (DESYREL) 50 MG tablet 1-2 tablets at night for sleep 60 tablet 1  . valsartan-hydrochlorothiazide (DIOVAN-HCT) 320-25 MG tablet TAKE 1 TABLET BY MOUTH EVERY DAY IN THE MORNING 90 tablet 2  . Vilazodone HCl (VIIBRYD) 40 MG TABS Take 1 tablet (40 mg total) by mouth daily. 30 tablet 1  . Vitamin D, Ergocalciferol, (DRISDOL) 1.25 MG (50000 UNIT) CAPS capsule TAKE 1 CAPSULE BY MOUTH EVERY 7 DAYS. 15 capsule 0  . warfarin (COUMADIN) 5 MG tablet 2m on Sunday, 2.540mAll other days 70 tablet 2   No current facility-administered medications on file prior to visit.    Cardiovascular studies:  EKG 03/14/2020: Sinus rhythm Right bundle branch block Left anterior fascicular block  Lexiscan myoview stress test 01/30/2018: 1. The resting electrocardiogram demonstrated normal sinus rhythm, LAD, LAFB, RBBB and no resting arrhythmias.  The stress electrocardiogram was non-diagnostic due to pharmacologic stress. Stress symptoms included dyspnea. Resting BP 166/86 and peak BP 202/88 mm Hg.  2. Myocardial perfusion imaging is  normal. Overall left ventricular systolic function was normal without regional wall motion abnormalities. The left ventricular ejection fraction was 56%.  This is a low risk study.  Echocardiogram 01/28/2018:  Left ventricle cavity is normal in size. Moderate concentric hypertrophy of the left ventricle. Normal global wall motion. Doppler evidence of grade I (impaired) diastolic dysfunction, normal LAP. Calculated EF 55%. Left atrial cavity is mildly dilated. Mild (Grade I) aortic regurgitation. Mild (Grade I) mitral regrgitation. Trace tricuspid regurgitation. Inadequate tricuspid  regurgitation jet to estimate pulmonary artery pressure. Normal right atrial pressure.  Recent labs: 01/26/2020: Glucose 99, BUN/Cr 25/0.93. EGFR 59. Na/K 139/4.3. AST 38. Rest of the CMP normal H/H 12/37. MCV 92. Platelets 195 HbA1C 6.7% Chol 140, TG 77, HDL 51, LDL 73 TSH 1.5 normal  05/28/2019: Glucose 119, BUN/Cr 17/0.85. EGFR 66. Na/K 140/4.5.   10-02/2019: Glucose 96, BUN/Cr 20/0.74. EGFR 79. Na/K 139/4.4. Rest of the CMP normal H/H 12/36. MCV 91. Platelets 247 Chol 133, TG 119, HDL 38, LDL 71 TSH 0.9 normal   10/17/2018: Glucose 98, BUN/Cr 20/0.8. EGFR >60. Na/K 140/4.2. Rest of the CMP normal   Review of Systems  Cardiovascular: Negative for chest pain, dyspnea on exertion, leg swelling, palpitations and syncope.        Vitals:   06/11/20 1329  BP: 128/74  Pulse: 78  Resp: 16  Temp: 98.7 F (37.1 C)  SpO2: 96%      Objective:   Physical Exam Vitals and nursing note reviewed.  Constitutional:      General: She is not in acute distress. Neck:     Vascular: No JVD.  Cardiovascular:     Rate and Rhythm: Normal rate and regular rhythm.     Pulses: Intact distal pulses.     Heart sounds: Normal heart sounds. No murmur heard.     Comments: Bilateral LE varcocities Pulmonary:     Effort: Pulmonary effort is normal.     Breath sounds: Normal breath sounds. No wheezing or rales.         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 Conitnue valsartan-HCTZ 320-25 mg  daily, amlodipine 5 mg daily.   Leg edema: Improved with compression stockings.  F/u in 3 months.   Nigel Mormon, MD [facility] [place] Pager: [phone] Office: [phone] If no answer Cell [phone]

## 2020-06-12 ENCOUNTER — Encounter: Payer: Self-pay | Admitting: Cardiology

## 2020-06-16 ENCOUNTER — Other Ambulatory Visit: Payer: Self-pay | Admitting: Psychiatry

## 2020-06-17 ENCOUNTER — Other Ambulatory Visit: Payer: Self-pay

## 2020-06-17 ENCOUNTER — Encounter: Payer: Self-pay | Admitting: Psychiatry

## 2020-06-17 ENCOUNTER — Ambulatory Visit (INDEPENDENT_AMBULATORY_CARE_PROVIDER_SITE_OTHER): Payer: Medicare PPO | Admitting: Psychiatry

## 2020-06-17 DIAGNOSIS — F4001 Agoraphobia with panic disorder: Secondary | ICD-10-CM | POA: Diagnosis not present

## 2020-06-17 DIAGNOSIS — F5105 Insomnia due to other mental disorder: Secondary | ICD-10-CM

## 2020-06-17 DIAGNOSIS — F338 Other recurrent depressive disorders: Secondary | ICD-10-CM | POA: Diagnosis not present

## 2020-06-17 DIAGNOSIS — F331 Major depressive disorder, recurrent, moderate: Secondary | ICD-10-CM | POA: Diagnosis not present

## 2020-06-17 DIAGNOSIS — F411 Generalized anxiety disorder: Secondary | ICD-10-CM | POA: Diagnosis not present

## 2020-06-17 MED ORDER — MIRTAZAPINE 7.5 MG PO TABS: 7.5000 mg | ORAL_TABLET | Freq: Every day | ORAL | 1 refills | Status: DC

## 2020-06-17 MED ORDER — DULOXETINE HCL 30 MG PO CPEP: 90.0000 mg | ORAL_CAPSULE | Freq: Every day | ORAL | 1 refills | Status: DC

## 2020-06-17 NOTE — Patient Instructions (Signed)
Reduce Viibryd to 20 mg daily and start 1 of the duloxetine 30 mg daily for 5 days, Then continue Viibryd 20 mg daily and increase duloxetine to 2 of the 30 mg daily for 5 days, Then reduce Viibryd to 10 mg daily and increase duloxetine to 3 of the 30 mg daily for 5 days, Then stop Viibryd and continue duloxetine 3 daily.

## 2020-06-17 NOTE — Progress Notes (Signed)
Andrea Simmons [id number] 12-Apr-1940 81 y.o.  Subjective:   Patient ID:  Andrea Simmons is a 80 y.o. (DOB 1940/10/06) female.  Chief Complaint:  Chief Complaint  Patient presents with  . Follow-up  . Major depressive disorder, recurrent episode, moderate ([place])  . Sleeping Problem  . Depression    Depression        Associated symptoms include appetite change and myalgias.  Associated symptoms include no decreased concentration and no suicidal ideas.   Andrea Simmons presents  today for recent exacerbation of depression..    At visit was July 09, 2018.  Vraylar was discontinued for lack of response.  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  This was partially helpful.  When  seen Aug 15, 2018 and Ritalin was increased to 15 mg twice daily because of partial response at the lower dose.  At visit and of July 2020.  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  The Ritalin was increased again to 20 mg twice daily.  seen December 04, 2018 & 02/2019.  Doesn't like winter.  Seasonal depression and crying more.  There were no med changes.   11/17/19 appt with the following noted: BP been higher and seeing Card and having med changes. Stopped Vit D bc level was high. Some discussion about the Ritalin over the BP control. Some increase depression in part over health issues.  No marital problems.  No SI.  Issues with daughter are a stressor.  D said she was mean when D was growing up and was too strict.  Felt D was ungrateful and that she and H did the best they could do.  I can't get over that and has told D about this. Ritalin with less energizing benefit and less productive than she was..  Wears off around supper time.  At night cannot break habit of snacks at night. Does not feel it kick in and wear off.  Back to old habits of staying up too late,  now MN.  Needs 7-8 hours.  Always needed more than others.    I feel good taking it.  Most days feels pretty good.  Can have crying spells without reason even before the Ritalin.   Doing much more than I was.   H still sick often too. Plan: Reduce Ritalin to 10 mg twice daily due to loss of benefit and blood pressure  Start change from duloxetine to Trintellix to help depression: Reduce duloxetine to 3 of 30 mg capsules and start Trintellix 5 mg daily for 1 week, Then reduce duloxetine to 2 of the 30 mg capsules and increase Trintellix to 10 mg daily for 1 week, Then increase Trintellix to 10 + 5 mg tablets and reduce duloxetine to 1 of the 30 mg capsules for 1 week, Then stop Duloxetine and increase Trintellix to 20 mg daily (or 2 of the 10 mg tablets)  01/14/2020 appointment with the following noted: Is on Trintellix 20 mg daily since 12/08/19 approximately. Was really hard with the switch.  Got really low and now some better.  Had a lot of aches and fatigue for awhile for 5 weeks.  Wonders if she was sick.  She doesn't feel like Trintellix is the right med. Gained 14# and still on low dose Ritalin.  Still having crying spells and poor energy but some  days feels pretty good. No nausea or other SE noticed. A lot of worry over her health and BP and H's cellulitis again. Plan: Overall patient has not improved with switch from duloxetine to Trintellix.  Her energy is not better and her mood is not better.  She is also quite anxious. Reduce Trintellix to 10 mg 1 daily and start viibryd 10 mg daily for a week, then  Stop Trintellix and increase Viibryd to 20 mg daily with food.  [place] 01/23/20 wanting to stop Ritalin so she did.  01/30/20 TC : LM:  Suggested she restart vitamin D 2000 units daily.  Her vitamin D level is a little low and we would like to see it in the 50s.  It is hoped that dose will push back into the 50s and if there is a question we will recheck it in 3 months. She has not been on  Viibryd 20 mg long enough to see the full benefit of that.  She needs to continue Viibryd 20 mg for at least a full 4 weeks at that dosage so another 3 weeks or so.  At that point we could consider increasing it if needed.   03/10/20 appt with following noted: Ritalin didn't help and might have increased BP per card. On Viibryd about 6 weeks at 20 mg. Read on viibryd  And concerned about taking with coumadin. Gotten where she can't sleep well.  Last week only averaging 3 hours per night.  Also irritable a lot in last 10 days. Gained 17#. Hungry. Worries about things more than in the past and some of it is age. Andrea Simmons and Monday diarrhea.  Otherwise just at times. Plan: DC Ritalin per her request. Poor response so far with Viibryd. Increase Viibryd to 30 mg for 1 week, then 40 mg daily.  04/28/2020 appt with following noted: A lot better but far from where I need to be. Still depressed. Can't sleep.  Catnap.  Taking viibryd in morning 40 mg for 5 weeks. Increase Viibryd to 40 mg daily.   3-4 hours sleep and no naps.  Not drowsy daytime. Ongoing tiredness chronically. Feels more tense and irritable from not sleeping.  Plan: continue Viibryd 40  06/17/2020 appt with following noted: vIIBRYD not helping. Trazodone 25 mg hallucinated bugs and screamed. H not doing well and that hasn't helped mood. Body aches and hurts all over. Asks about return to Cymbalta. Still gaining weight. 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   Past Psychiatric Medication Trials: Rozerem, zolpidem,  Trazodone severe SE Duloxetine 120, Paxil 20 for years, Sertraline, fluoxetine, Wellbutrin,  Trintellix 20 5 weeks NR. Viibryd 40  NR, Low dose nortriptyline, Abilify weight gain and loss benefit, Was More talkative on the Abilify and the Wellbutrin.  lithium 2004 SE tremor at 688m daily.   Vraylar NR, Buspar NR.  Review of Systems:  Review of Systems   Constitutional: Positive for appetite change and unexpected weight change.  Cardiovascular: Negative for palpitations.  Gastrointestinal: Negative for diarrhea.  Musculoskeletal: Positive for arthralgias, back pain, joint swelling and myalgias. Negative for neck stiffness.  Neurological: Negative for tremors, weakness and numbness.  Psychiatric/Behavioral: Positive for depression and dysphoric mood. Negative for agitation, behavioral problems, confusion, decreased concentration, hallucinations, self-injury, sleep disturbance and suicidal ideas. The patient is nervous/anxious. The patient is not hyperactive.    Hx anemia.    Cardiologist said she had a stiff heart.  History of pulmonary embolism and on Coumadin.  Medications: I have reviewed the patient's current medications.  Current Outpatient Medications  Medication Sig Dispense Refill  . Accu-Chek Softclix Lancets lancets Use as instructed to test sugars BID 100 each 12  . acetaminophen (TYLENOL) 500 MG tablet Take 500 mg by mouth every 6 (six) hours as needed.    Marland Kitchen amLODipine (NORVASC) 10 MG tablet TAKE 1 TABLET BY MOUTH EVERY DAY 90 tablet 2  . BIOTIN PO Take by mouth. [place]    . Blood Glucose Monitoring Suppl (ACCU-CHEK GUIDE) w/Device KIT 1 each by Does not apply route 2 (two) times daily. To test sugars. Dx. E11.9 1 kit 1  . DULoxetine (CYMBALTA) 30 MG capsule Take 3 capsules (90 mg total) by mouth daily. 90 capsule 1  . fenofibrate (TRICOR) 48 MG tablet Take 1 tablet by mouth daily.    . furosemide (LASIX) 20 MG tablet TAKE 1 TABLET BY MOUTH AS NEEDED (Patient taking differently: Take 20 mg by mouth daily. Only takes as needed) 90 tablet 1  . glucose blood (ACCU-CHEK GUIDE) test strip CHECK BLOOD SUGARS DAILY 100 strip 12  . levothyroxine (SYNTHROID) 25 MCG tablet TAKE 1 TABLET BY MOUTH EVERY DAY BEFORE BREAKFAST 90 tablet 1  . Magnesium 500 MG TABS Take by mouth.    . metFORMIN (GLUCOPHAGE-XR) 500 MG 24 hr tablet  Take 500 mg by mouth daily with breakfast.  3  . metoprolol succinate (TOPROL-XL) 25 MG 24 hr tablet Take 1 tablet (25 mg total) by mouth daily. Take with or immediately following a meal. 90 tablet 2  . mirtazapine (REMERON) 7.5 MG tablet Take 1 tablet (7.5 mg total) by mouth at bedtime. 30 tablet 1  . nitroGLYCERIN (NITROSTAT) 0.4 MG SL tablet Place 1 tablet (0.4 mg total) under the tongue every 5 (five) minutes as needed for chest pain. 30 tablet 3  . RESTASIS MULTIDOSE 0.05 % ophthalmic emulsion Place 1 drop into both eyes 2 (two) times daily.   6  . rosuvastatin (CRESTOR) 20 MG tablet Take 20 mg by mouth daily.    . valsartan-hydrochlorothiazide (DIOVAN-HCT) 320-25 MG tablet TAKE 1 TABLET BY MOUTH EVERY DAY IN THE MORNING 90 tablet 2  . Vitamin D, Ergocalciferol, (DRISDOL) 1.25 MG (50000 UNIT) CAPS capsule TAKE 1 CAPSULE BY MOUTH EVERY 7 DAYS. 15 capsule 0  . warfarin (COUMADIN) 5 MG tablet 27m on Sunday, 2.536mAll other days 70 tablet 2  . Choline Fenofibrate (FENOFIBRIC ACID) 135 MG CPDR  (Patient not taking: Reported on 06/17/2020)     No current facility-administered medications for this visit.    Medication Side Effects: None talkative more the [facility]  Allergies:  Allergies  Allergen Reactions  . Anaprox [Naproxen Sodium]   . Lipitor [Atorvastatin]   . Morphine   . Meperidine Nausea And Vomiting  . Naproxen Sodium Nausea And Vomiting    Past Medical History:  Diagnosis Date  . Anemia   . Anxiety   . [place]  . Depression   . Diabetes mellitus type II   . Fibromyalgia   . GERD (gastroesophageal reflux disease)   . Hyperlipidemia   . Hypertension   . Malignant neoplasm of breast (female), unspecified site 1993   L breast s/p mastectomy and tamoxifen x 5y77yr. 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 Unspecified hypothyroidism     Family History  Problem Relation  Age of Onset  . Depression Other        Parent   . Arthritis Other        Parent, Grandparent  . Hypertension Other        Grandparent  . Hyperlipidemia Other        [place]  . Miscarriages / Stillbirths Other        Grandparent  . Stroke Other        [facility]  . Cancer Maternal Uncle        prostate  . Hypertension Maternal Grandfather   . Breast cancer Cousin   . Breast cancer Cousin     Social History   Socioeconomic History  . Marital status: Married    Spouse name: Not on file  . Number of children: 1  . Years of education: Not on file  . Highest education level: Not on file  Occupational History  . Occupation: Armed forces technical officer: RETIRED  . Occupation: Emergency planning/management officer  . Occupation: school bus driver  Tobacco Use  . Smoking status: Never Smoker  . Smokeless tobacco: Never Used  Vaping Use  . Vaping Use: Never used  Substance and Sexual Activity  . Alcohol use: No    Alcohol/week: 0.0 standard drinks  . Drug use: No  . Sexual activity: Not on file  Other Topics Concern  . Not on file  Social History Narrative  . Not on file   Social Determinants of Health   Financial Resource Strain: Low Risk   . Difficulty of Paying Living Expenses: Not hard at all  Food Insecurity: No Food Insecurity  . Worried About Charity fundraiser in the Last Year: Never true  . Ran Out of Food in the Last Year: Never true  Transportation Needs: No Transportation Needs  . Lack of Transportation (Medical): No  . Lack of Transportation (Non-Medical): No  Physical Activity: Inactive  . Days of Exercise per Week: 0 days  . Minutes of Exercise per Session: 0 min  Stress: No Stress Concern Present  . Feeling of Stress : Only a little  Social Connections: Moderately Integrated  . Frequency of Communication with Friends and Family: More than three times a week  . Frequency of Social Gatherings with Friends and Family: Once a week  . Attends Religious Services: 1 to 4 times per year  . Active Member of Clubs or Organizations:  No  . Attends Archivist Meetings: Never  . Marital Status: Married  Human resources officer Violence: Not At Risk  . Fear of Current or Ex-Partner: No  . Emotionally Abused: No  . Physically Abused: No  . Sexually Abused: No    Past Medical History, Surgical history, Social history, and Family history were reviewed and updated as appropriate.   Please see review of systems for further details on the patient's review from today.   Objective:   Physical Exam:  There were no vitals taken for this visit.  Physical Exam Constitutional:      General: She is not in acute distress.    Appearance: She is well-developed.  Musculoskeletal:        General: No deformity.  Neurological:     Mental Status: She is alert and oriented to person, place, and time.     Cranial Nerves: No dysarthria.     Coordination: Coordination normal.  Psychiatric:        Attention and Perception: Attention normal. She is attentive.  Mood and Affect: Mood is anxious and depressed. Affect is not labile, blunt, angry, tearful or inappropriate.        Speech: Speech normal.        Behavior: Behavior normal. Behavior is cooperative.        Thought Content: Thought content normal. Thought content is not paranoid or delusional. Thought content does not include homicidal or suicidal ideation. Thought content does not include homicidal or suicidal plan.        Cognition and Memory: Cognition and memory normal.        Judgment: Judgment normal.     Comments: Insight fair-good.   Depression is worse again Talkative     Lab Review:     Component Value Date/Time   NA 139 01/26/2020 1326   NA 140 05/28/2019 1609   NA 141 08/01/2016 1527   K 4.3 01/26/2020 1326   K 4.6 08/01/2016 1527   CL 105 01/26/2020 1326   CO2 26 01/26/2020 1326   CO2 24 08/01/2016 1527   GLUCOSE 99 01/26/2020 1326   GLUCOSE 93 08/01/2016 1527   BUN 25 (H) 01/26/2020 1326   BUN 17 05/28/2019 1609   BUN 19.9 08/01/2016 1527    CREATININE 0.93 01/26/2020 1326   CREATININE 0.9 08/01/2016 1527   CALCIUM 9.5 01/26/2020 1326   CALCIUM 9.7 08/01/2016 1527   PROT 6.8 01/26/2020 1326   PROT 7.2 08/01/2016 1527   PROT 6.8 08/01/2016 1527   ALBUMIN 4.5 01/26/2020 1326   ALBUMIN 4.0 08/01/2016 1527   AST 38 (H) 01/26/2020 1326   AST 41 (H) 08/01/2016 1527   ALT 30 01/26/2020 1326   ALT 25 08/01/2016 1527   ALKPHOS 44 01/26/2020 1326   ALKPHOS 48 08/01/2016 1527   BILITOT 0.6 01/26/2020 1326   BILITOT 0.33 08/01/2016 1527   GFRNONAA 66 05/28/2019 1609   GFRAA 76 05/28/2019 1609       Component Value Date/Time   WBC 5.1 01/26/2020 1326   RBC 4.03 01/26/2020 1326   HGB 12.5 01/26/2020 1326   HGB 9.5 (L) 08/01/2016 1527   HCT 37.2 01/26/2020 1326   HCT 31.2 (L) 08/01/2016 1527   PLT 195.0 01/26/2020 1326   PLT 221 08/01/2016 1527   MCV 92.4 01/26/2020 1326   MCV 83.9 08/01/2016 1527   MCH 30.4 10/17/2018 1204   MCHC 33.7 01/26/2020 1326   RDW 13.2 01/26/2020 1326   RDW 17.2 (H) 08/01/2016 1527   LYMPHSABS 1.5 01/26/2020 1326   LYMPHSABS 2.0 08/01/2016 1527   MONOABS 0.4 01/26/2020 1326   MONOABS 0.4 08/01/2016 1527   EOSABS 0.1 01/26/2020 1326   EOSABS 0.1 08/01/2016 1527   BASOSABS 0.0 01/26/2020 1326   BASOSABS 0.0 08/01/2016 1527    No results found for: POCLITH, LITHIUM   No results found for: PHENYTOIN, PHENOBARB, VALPROATE, CBMZ   Endo checked thyroid lately.   Vitamin D level on April 01, 2018 reported as 40.  After that vitamin D 50,000 units weekly was prescribed with a goal of achieving levels in the 50s and 60s.  December 04, 2018 vitamin D 25 off supplement .res Assessment: Plan:    Doreather was seen today for follow-up, major depressive disorder, recurrent episode, moderate ([facility]), sleeping problem and depression.  Diagnoses and all orders for this visit:  Major depressive disorder, recurrent episode, moderate ([facility]) -     DULoxetine (CYMBALTA) 30 MG capsule; Take 3 capsules  (90 mg total) by mouth daily.  Seasonal depression ([place]) -  DULoxetine (CYMBALTA) 30 MG capsule; Take 3 capsules (90 mg total) by mouth daily.  Panic disorder with agoraphobia -     DULoxetine (CYMBALTA) 30 MG capsule; Take 3 capsules (90 mg total) by mouth daily.  Generalized anxiety disorder -     DULoxetine (CYMBALTA) 30 MG capsule; Take 3 capsules (90 mg total) by mouth daily.  Insomnia due to mental condition -     mirtazapine (REMERON) 7.5 MG tablet; Take 1 tablet (7.5 mg total) by mouth at bedtime.     Greater than 50% of  30 min face to face time with patient was spent on counseling and coordination of care. We discussed several things.TRD and wants change.  We discussed options for dealing with this. Pt has mixed sx so consider unconventional alternatives.  Discussed potential benefits, risks, and side effects of stimulants with patient to include increased heart rate, palpitations, insomnia, increased anxiety, increased irritability, or decreased appetite.  Instructed patient to contact office if experiencing any significant tolerability issues. She does not appear manic and not sig different from the last visit. Still talkative on it but not manic.  Options pramipexole, Rexulti.  Disc SE and options.   Will add Rexulti next time if not better.  CO poor effect with Viibryd and duloxetine did help FM more and pain. Wean Viibryd and restart duloxetine and increase to 90 mg daily (*took 120 mg before) Reduce Viibryd to 20 mg daily and start 1 of the duloxetine 30 mg daily for 5 days, Then continue Viibryd 20 mg daily and increase duloxetine to 2 of the 30 mg daily for 5 days, Then reduce Viibryd to 10 mg daily and increase duloxetine to 3 of the 30 mg daily for 5 days, Then stop Viibryd and continue duloxetine 3 daily.  Rec trial mirtazapine 7.5 mg HS. Educated that she cannot expect to sleep solid through the night.  Explained.    Option counseling but availability is  limited for options. But on list for Rinaldo Cloud  Repeat vitamin D level was 47.  Previously high.  Restarted 2000 units daily.   Been off supplement since July 2021   FU 6 weeks  Lynder Parents, MD, DFAPA   Please see After Visit Summary for patient specific instructions.  Future Appointments  Date Time Provider [place]  06/24/2020  1:00 PM Shanon Ace, LCSW CP-CP None  07/21/2020  1:00 PM [facility] COUMADIN [facility] PEC  08/06/2020  1:30 PM GI[facility]G DX DEXA 1 GI-BCGDG GI-BREAST CE  09/13/2020  1:00 PM [name], [name], MD PCV-PCV None    No orders of the defined types were placed in this encounter.     -------------------------------

## 2020-06-19 DIAGNOSIS — I10 Essential (primary) hypertension: Secondary | ICD-10-CM | POA: Diagnosis not present

## 2020-06-24 ENCOUNTER — Ambulatory Visit (INDEPENDENT_AMBULATORY_CARE_PROVIDER_SITE_OTHER): Payer: Medicare PPO | Admitting: Psychiatry

## 2020-06-24 ENCOUNTER — Other Ambulatory Visit: Payer: Self-pay

## 2020-06-24 DIAGNOSIS — F331 Major depressive disorder, recurrent, moderate: Secondary | ICD-10-CM

## 2020-06-24 NOTE — Progress Notes (Signed)
      [facility] Counselor/Therapist Progress Note  Patient ID: TYRICA AFZAL, MRN: [id number],    Date: 06/24/2020  Time Spent: 50 minutes  1:00pm to 1:50pm  Treatment Type: Individual Therapy  Reported Symptoms: depression, anxiety  Mental Status Exam:  Appearance:   Neat     Behavior:  Appropriate, Sharing and Motivated  Motor:  some slowed but walks on her own  Speech/Language:   Clear and Coherent  Affect:  depressed, anxious  Mood:  anxious and depressed  Thought process:  some ruminating  Thought content:    some ruminating  Sensory/Perceptual disturbances:    WNL  Orientation:  oriented to person, place, time/date, situation, day of week, month of year and year  Attention:  Good  Concentration:  Fair  Memory:  "sometimes forget but not real bad"  Fund of knowledge:   Good  Insight:    Good and Fair  Judgment:   Good  Impulse Control:  Good   Risk Assessment: Danger to Self:  No Self-injurious Behavior: No Danger to Others: No Duty to Warn:no Physical Aggression / Violence:No  Access to Firearms a concern: No  Gang Involvement:No   Subjective: Patient today reports depression "but improving" and "anxiety sometimes". Medicine change at her last appt with Dr. Clovis Pu and patient reports that is helping. Patient reports feeing that she is managing better with her thoughts and "how I feel every day."   Interventions: Solution-Oriented/Positive Psychology and Ego-Supportive  Diagnosis:   ICD-10-CM   1. Major depressive disorder, recurrent episode, moderate ([place])  F33.1      Plan of Care: Patient not signing treatment plan on computer screen due to Covid.  Treatment Goals: Goals remain on tx plan as patient works with strategies to achieve her goals.  Long term goal: Develop healthy cognitive beliefs about self and the world that lead to alleviation of depression and help prevent relapse of depression.  Short term goal: Identify and replace  depressive thinking that leads to depressive feelings and actions.  Strategies: Replace negative self-defeating self-talk with verbalization of realistic and positive messages.   Progress: Patient in today reporting some "improvement in her depression and has had some anxiety about her husband's hematoma". Very talkative and seemed to be having a better day today. Reports less tearfulness recently. States this time of year is usually better for her, versus the late Fall and Winter months when she tends to struggle more with depression. Still bothered by pain with her OA and Fibromyalgia "but not any worse". Husband currently confined to home and having to stay off his feet a lot due to a" hematoma on his hip." Discussed with patient, what contributes to her being less depressed and encouraged to continue those things including: staying on her meds as prescribed, staying in contact with people who are supportive of her, get outside some daily, getting enough sleep, intentionally look for "positives" daily,and practice positive self-talk and self-care.   Goal review and progress/challenges noted with patient.  Doing better currrently Next appt within 4-6 weeks. Will call if for sooner appt if needed.   Shanon Ace, LCSW

## 2020-07-19 DIAGNOSIS — I10 Essential (primary) hypertension: Secondary | ICD-10-CM | POA: Diagnosis not present

## 2020-07-21 ENCOUNTER — Ambulatory Visit (INDEPENDENT_AMBULATORY_CARE_PROVIDER_SITE_OTHER): Payer: Medicare PPO | Admitting: General Practice

## 2020-07-21 ENCOUNTER — Other Ambulatory Visit: Payer: Self-pay

## 2020-07-21 DIAGNOSIS — Z7901 Long term (current) use of anticoagulants: Secondary | ICD-10-CM

## 2020-07-21 DIAGNOSIS — I2699 Other pulmonary embolism without acute cor pulmonale: Secondary | ICD-10-CM

## 2020-07-21 LAB — POCT INR: INR: 3.5 — AB (ref 2.0–3.0)

## 2020-07-21 NOTE — Patient Instructions (Addendum)
Pre visit review using our clinic review tool, if applicable. No additional management support is needed unless otherwise documented below in the visit note. ° °Continue to take 1 tablet daily except 1/2 tablet on Monday Wed and Fridays.  Re-check in 4 weeks.   °

## 2020-07-29 DIAGNOSIS — M25439 Effusion, unspecified wrist: Secondary | ICD-10-CM | POA: Diagnosis not present

## 2020-07-29 DIAGNOSIS — M25539 Pain in unspecified wrist: Secondary | ICD-10-CM | POA: Diagnosis not present

## 2020-07-29 DIAGNOSIS — Z791 Long term (current) use of non-steroidal anti-inflammatories (NSAID): Secondary | ICD-10-CM | POA: Diagnosis not present

## 2020-07-29 DIAGNOSIS — M810 Age-related osteoporosis without current pathological fracture: Secondary | ICD-10-CM | POA: Diagnosis not present

## 2020-07-29 DIAGNOSIS — M150 Primary generalized (osteo)arthritis: Secondary | ICD-10-CM | POA: Diagnosis not present

## 2020-07-29 DIAGNOSIS — M79643 Pain in unspecified hand: Secondary | ICD-10-CM | POA: Diagnosis not present

## 2020-07-29 DIAGNOSIS — F329 Major depressive disorder, single episode, unspecified: Secondary | ICD-10-CM | POA: Diagnosis not present

## 2020-07-29 DIAGNOSIS — M797 Fibromyalgia: Secondary | ICD-10-CM | POA: Diagnosis not present

## 2020-08-05 ENCOUNTER — Other Ambulatory Visit: Payer: Self-pay | Admitting: Family Medicine

## 2020-08-05 DIAGNOSIS — Z1231 Encounter for screening mammogram for malignant neoplasm of breast: Secondary | ICD-10-CM

## 2020-08-06 ENCOUNTER — Ambulatory Visit
Admission: RE | Admit: 2020-08-06 | Discharge: 2020-08-06 | Disposition: A | Discharge status: Home / Self Care | Payer: Medicare PPO | Source: Ambulatory Visit | Attending: Family Medicine | Admitting: Family Medicine

## 2020-08-06 ENCOUNTER — Other Ambulatory Visit: Payer: Self-pay

## 2020-08-06 DIAGNOSIS — Z780 Asymptomatic menopausal state: Secondary | ICD-10-CM | POA: Diagnosis not present

## 2020-08-06 DIAGNOSIS — M85851 Other specified disorders of bone density and structure, right thigh: Secondary | ICD-10-CM | POA: Diagnosis not present

## 2020-08-12 ENCOUNTER — Ambulatory Visit (INDEPENDENT_AMBULATORY_CARE_PROVIDER_SITE_OTHER): Payer: Medicare PPO | Admitting: Psychiatry

## 2020-08-12 ENCOUNTER — Encounter: Payer: Self-pay | Admitting: Psychiatry

## 2020-08-12 ENCOUNTER — Other Ambulatory Visit: Payer: Self-pay

## 2020-08-12 DIAGNOSIS — F411 Generalized anxiety disorder: Secondary | ICD-10-CM

## 2020-08-12 DIAGNOSIS — G4721 Circadian rhythm sleep disorder, delayed sleep phase type: Secondary | ICD-10-CM | POA: Diagnosis not present

## 2020-08-12 DIAGNOSIS — F338 Other recurrent depressive disorders: Secondary | ICD-10-CM

## 2020-08-12 DIAGNOSIS — F5105 Insomnia due to other mental disorder: Secondary | ICD-10-CM

## 2020-08-12 DIAGNOSIS — F4001 Agoraphobia with panic disorder: Secondary | ICD-10-CM

## 2020-08-12 DIAGNOSIS — F331 Major depressive disorder, recurrent, moderate: Secondary | ICD-10-CM

## 2020-08-12 MED ORDER — DULOXETINE HCL 30 MG PO CPEP: 90.0000 mg | ORAL_CAPSULE | Freq: Every day | ORAL | 1 refills | Status: DC

## 2020-08-12 MED ORDER — MIRTAZAPINE 7.5 MG PO TABS: 7.5000 mg | ORAL_TABLET | Freq: Every day | ORAL | 1 refills | Status: DC

## 2020-08-12 NOTE — Progress Notes (Signed)
Andrea Simmons [id number] [birth year] 80 y.o.  Subjective:   Patient ID:  Andrea Simmons is a 80 y.o. (DOB 1941-02-01) female.  Chief Complaint:  Chief Complaint  Patient presents with  . Major depressive disorder, recurrent episode, moderate ([place])  . Follow-up  . Sleeping Problem    Depression        Associated symptoms include appetite change and myalgias.  Associated symptoms include no decreased concentration and no suicidal ideas.   Andrea Simmons presents  today for recent exacerbation of depression..    At visit was July 09, 2018.  Vraylar was discontinued for lack of response.  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  This was partially helpful.  When  seen Aug 15, 2018 and Ritalin was increased to 15 mg twice daily because of partial response at the lower dose.  At visit and of July 2020.  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  The Ritalin was increased again to 20 mg twice daily.  seen December 04, 2018 & 02/2019.  Doesn't like winter.  Seasonal depression and crying more.  There were no med changes.   11/17/19 appt with the following noted: BP been higher and seeing Card and having med changes. Stopped Vit D bc level was high. Some discussion about the Ritalin over the BP control. Some increase depression in part over health issues.  No marital problems.  No SI.  Issues with daughter are a stressor.  D said she was mean when D was growing up and was too strict.  Felt D was ungrateful and that she and H did the best they could do.  I can't get over that and has told D about this. Ritalin with less energizing benefit and less productive than she was..  Wears off around supper time.  At night cannot break habit of snacks at night. Does not feel it kick in and wear off.  Back to old habits of staying up too late, now MN.  Needs  7-8 hours.  Always needed more than others.    I feel good taking it.  Most days feels pretty good.  Can have crying spells without reason even before the Ritalin.   Doing much more than I was.   H still sick often too. Plan: Reduce Ritalin to 10 mg twice daily due to loss of benefit and blood pressure  Start change from duloxetine to Trintellix to help depression: Reduce duloxetine to 3 of 30 mg capsules and start Trintellix 5 mg daily for 1 week, Then reduce duloxetine to 2 of the 30 mg capsules and increase Trintellix to 10 mg daily for 1 week, Then increase Trintellix to 10 + 5 mg tablets and reduce duloxetine to 1 of the 30 mg capsules for 1 week, Then stop Duloxetine and increase Trintellix to 20 mg daily (or 2 of the 10 mg tablets)  01/14/2020 appointment with the following noted: Is on Trintellix 20 mg daily since 12/08/19 approximately. Was really hard with the switch.  Got really low and now some better.  Had a lot of aches and fatigue for awhile for 5 weeks.  Wonders if she was sick.  She doesn't feel like Trintellix is the right med. Gained 14# and still on low dose Ritalin.  Still having crying spells and poor energy but some days feels pretty  good. No nausea or other SE noticed. A lot of worry over her health and BP and H's cellulitis again. Plan: Overall patient has not improved with switch from duloxetine to Trintellix.  Her energy is not better and her mood is not better.  She is also quite anxious. Reduce Trintellix to 10 mg 1 daily and start viibryd 10 mg daily for a week, then  Stop Trintellix and increase Viibryd to 20 mg daily with food.  Lewis 01/23/20 wanting to stop Ritalin so she did.  01/30/20 TC : LM:  Suggested she restart vitamin D 2000 units daily.  Her vitamin D level is a little low and we would like to see it in the 50s.  It is hoped that dose will push back into the 50s and if there is a question we will recheck it in 3 months. She has not been on Viibryd 20 mg  long enough to see the full benefit of that.  She needs to continue Viibryd 20 mg for at least a full 4 weeks at that dosage so another 3 weeks or so.  At that point we could consider increasing it if needed.   03/10/20 appt with following noted: Ritalin didn't help and might have increased BP per card. On Viibryd about 6 weeks at 20 mg. Read on viibryd  And concerned about taking with coumadin. Gotten where she can't sleep well.  Last week only averaging 3 hours per night.  Also irritable a lot in last 10 days. Gained 17#. Hungry. Worries about things more than in the past and some of it is age. [name] and Monday diarrhea.  Otherwise just at times. Plan: DC Ritalin per her request. Poor response so far with Viibryd. Increase Viibryd to 30 mg for 1 week, then 40 mg daily.  04/28/2020 appt with following noted: A lot better but far from where I need to be. Still depressed. Can't sleep.  Catnap.  Taking viibryd in morning 40 mg for 5 weeks. Increase Viibryd to 40 mg daily.   3-4 hours sleep and no naps.  Not drowsy daytime. Ongoing tiredness chronically. Feels more tense and irritable from not sleeping.  Plan: continue Viibryd 40  06/17/2020 appt with following noted: vIIBRYD not helping. Trazodone 25 mg hallucinated bugs and screamed. H not doing well and that hasn't helped mood. Body aches and hurts all over. Asks about return to Cymbalta. Still gaining weight. Broken sleep with total of 6-8 hours Plan: CO poor effect with Viibryd and duloxetine did help FM more and pain. Wean Viibryd and restart duloxetine and increase to 90 mg daily (*took 120 mg before) Reduce Viibryd to 20 mg daily and start 1 of the duloxetine 30 mg daily for 5 days, Then continue Viibryd 20 mg daily and increase duloxetine to 2 of the 30 mg daily for 5 days, Then reduce Viibryd to 10 mg daily and increase duloxetine to 3 of the 30 mg daily for 5 days, Then stop Viibryd and continue duloxetine 3 daily.  Rec trial  mirtazapine 7.5 mg HS.  08/12/2020 appointment with the following noted: I'm doing better with Cymbalta 90. "much, much better" Mirtazapine didn't work for [employer] but is sleeping now.  Laid down at MN and to sleep 2 A and up 845. Arthritis is bad and dx gout.  Joints in hands are bad.  Anxiety manageable. Depression got really bad before the switch in meds.  Pleased with result.  Seasonal depression starts in fall and until spring. Busy  and worked with flowers. Pretty good now.  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   Past Psychiatric Medication Trials: Rozerem, zolpidem,  Trazodone severe SE, mirtazapine 7.5  Duloxetine 120, Paxil 20 for years, Sertraline, fluoxetine, Wellbutrin,   Trintellix 20 5 weeks NR.  Viibryd 40  NR,  Low dose nortriptyline, Abilify weight gain and loss benefit, Was More talkative on the Abilify and the Wellbutrin.  lithium 2004 SE tremor at 68m daily.   Vraylar NR, Buspar NR.  Review of Systems:  Review of Systems  Constitutional: Positive for appetite change. Negative for unexpected weight change.  Cardiovascular: Negative for palpitations.  Gastrointestinal: Negative for diarrhea.  Musculoskeletal: Positive for arthralgias, back pain, joint swelling and myalgias. Negative for neck stiffness.  Neurological: Negative for tremors, weakness and numbness.  Psychiatric/Behavioral: Positive for depression and dysphoric mood. Negative for agitation, behavioral problems, confusion, decreased concentration, hallucinations, self-injury, sleep disturbance and suicidal ideas. The patient is nervous/anxious. The patient is not hyperactive.    Hx anemia.    Cardiologist said she had a stiff heart.  History of pulmonary embolism and on Coumadin.  Medications: I have reviewed the patient's current medications.  Current Outpatient Medications  Medication Sig Dispense Refill  . Accu-Chek Softclix Lancets lancets Use as instructed to test sugars BID  100 each 12  . acetaminophen (TYLENOL) 500 MG tablet Take 500 mg by mouth every 6 (six) hours as needed.    .Marland KitchenamLODipine (NORVASC) 10 MG tablet TAKE 1 TABLET BY MOUTH EVERY DAY 90 tablet 2  . BIOTIN PO Take by mouth. [facility]   . Blood Glucose Monitoring Suppl (ACCU-CHEK GUIDE) w/Device KIT 1 each by Does not apply route 2 (two) times daily. To test sugars. Dx. E11.9 1 kit 1  . fenofibrate (TRICOR) 48 MG tablet Take 1 tablet by mouth daily.    . furosemide (LASIX) 20 MG tablet TAKE 1 TABLET BY MOUTH AS NEEDED (Patient taking differently: Take 20 mg by mouth daily. Only takes as needed) 90 tablet 1  . glucose blood (ACCU-CHEK GUIDE) test strip CHECK BLOOD SUGARS DAILY 100 strip 12  . levothyroxine (SYNTHROID) 25 MCG tablet TAKE 1 TABLET BY MOUTH EVERY DAY BEFORE BREAKFAST 90 tablet 1  . Magnesium 500 MG TABS Take by mouth.    . metFORMIN (GLUCOPHAGE-XR) 500 MG 24 hr tablet Take 500 mg by mouth daily with breakfast.  3  . metoprolol succinate (TOPROL-XL) 25 MG 24 hr tablet Take 1 tablet (25 mg total) by mouth daily. Take with or immediately following a meal. 90 tablet 2  . nitroGLYCERIN (NITROSTAT) 0.4 MG SL tablet Place 1 tablet (0.4 mg total) under the tongue every 5 (five) minutes as needed for chest pain. 30 tablet 3  . RESTASIS MULTIDOSE 0.05 % ophthalmic emulsion Place 1 drop into both eyes 2 (two) times daily.   6  . rosuvastatin (CRESTOR) 20 MG tablet Take 20 mg by mouth daily.    . valsartan-hydrochlorothiazide (DIOVAN-HCT) 320-25 MG tablet TAKE 1 TABLET BY MOUTH EVERY DAY IN THE MORNING 90 tablet 2  . Vitamin D, Ergocalciferol, (DRISDOL) 1.25 MG (50000 UNIT) CAPS capsule TAKE 1 CAPSULE BY MOUTH ONE TIME PER WEEK 12 capsule 1  . warfarin (COUMADIN) 5 MG tablet 537mon Sunday, 2.31m16mll other days 70 tablet 2  . DULoxetine (CYMBALTA) 30 MG capsule Take 3 capsules (90 mg total) by mouth daily. 270 capsule 1  . mirtazapine (REMERON) 7.5 MG tablet  Take 1 tablet (7.5 mg total) by  mouth at bedtime. 90 tablet 1   No current facility-administered medications for this visit.    Medication Side Effects: None talkative more the [facility]  Allergies:  Allergies  Allergen Reactions  . Anaprox [Naproxen Sodium]   . Lipitor [Atorvastatin]   . Morphine   . Meperidine Nausea And Vomiting  . Naproxen Sodium Nausea And Vomiting    Past Medical History:  Diagnosis Date  . Anemia   . Anxiety   . Breast cancer ([place])   . Depression   . Diabetes mellitus type II   . Fibromyalgia   . GERD (gastroesophageal reflux disease)   . Hyperlipidemia   . Hypertension   . Malignant neoplasm of breast (female), unspecified site 1993   L breast s/p mastectomy and tamoxifen x 59yr  . 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 Unspecified hypothyroidism     Family History  Problem Relation Age of Onset  . Depression Other        Parent  . Arthritis Other        Parent, Grandparent  . Hypertension Other        Grandparent  . Hyperlipidemia Other        [place] . Miscarriages / Stillbirths Other        Grandparent  . Stroke Other        [facility] . Cancer Maternal Uncle        prostate  . Hypertension Maternal Grandfather   . Breast cancer Cousin   . Breast cancer Cousin     Social History   Socioeconomic History  . Marital status: Married    Spouse name: Not on file  . Number of children: 1  . Years of education: Not on file  . Highest education level: Not on file  Occupational History  . Occupation: tArmed forces technical officer RETIRED  . 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 Smoking status: Never Smoker  . Smokeless tobacco: Never Used  Vaping Use  . Vaping Use: Never used  Substance and Sexual Activity  . Alcohol use: No    Alcohol/week: 0.0 standard drinks  . Drug use: No  . Sexual activity: Not on file  Other Topics Concern  . Not on file  Social History Narrative  . Not on  file   Social Determinants of Health   Financial Resource Strain: Low Risk   . Difficulty of Paying Living Expenses: Not hard at all  Food Insecurity: No Food Insecurity  . Worried About RCharity fundraiserin the Last Year: Never true  . Ran Out of Food in the Last Year: Never true  Transportation Needs: No Transportation Needs  . Lack of Transportation (Medical): No  . Lack of Transportation (Non-Medical): No  Physical Activity: Inactive  . Days of Exercise per Week: 0 days  . Minutes of Exercise per Session: 0 min  Stress: No Stress Concern Present  . Feeling of Stress : Only a little  Social Connections: Moderately Integrated  . Frequency of Communication with Friends and Family: More than three times a week  . Frequency of Social Gatherings with Friends and Family: Once a week  . Attends Religious Services: 1 to 4 times per year  . Active Member of Clubs or Organizations: No  . Attends CArchivistMeetings: Never  . Marital Status:  Married  Intimate Partner Violence: Not At Risk  . Fear of Current or Ex-Partner: No  . Emotionally Abused: No  . Physically Abused: No  . Sexually Abused: No    Past Medical History, Surgical history, Social history, and Family history were reviewed and updated as appropriate.   Please see review of systems for further details on the patient's review from today.   Objective:   Physical Exam:  There were no vitals taken for this visit.  Physical Exam Constitutional:      General: She is not in acute distress.    Appearance: She is well-developed.  Musculoskeletal:        General: No deformity.  Neurological:     Mental Status: She is alert and oriented to person, place, and time.     Cranial Nerves: No dysarthria.     Coordination: Coordination normal.  Psychiatric:        Attention and Perception: Attention normal. She is attentive.        Mood and Affect: Mood is anxious and depressed. Affect is not labile, blunt, angry,  tearful or inappropriate.        Speech: Speech normal.        Behavior: Behavior normal. Behavior is cooperative.        Thought Content: Thought content normal. Thought content is not paranoid or delusional. Thought content does not include homicidal or suicidal ideation. Thought content does not include homicidal or suicidal plan.        Cognition and Memory: Cognition and memory normal.        Judgment: Judgment normal.     Comments: Insight fair-good.   Depression is better with duloxetine Talkative     Lab Review:     Component Value Date/Time   NA 139 01/26/2020 1326   NA 140 05/28/2019 1609   NA 141 08/01/2016 1527   K 4.3 01/26/2020 1326   K 4.6 08/01/2016 1527   CL 105 01/26/2020 1326   CO2 26 01/26/2020 1326   CO2 24 08/01/2016 1527   GLUCOSE 99 01/26/2020 1326   GLUCOSE 93 08/01/2016 1527   BUN 25 (H) 01/26/2020 1326   BUN 17 05/28/2019 1609   BUN 19.9 08/01/2016 1527   CREATININE 0.93 01/26/2020 1326   CREATININE 0.9 08/01/2016 1527   CALCIUM 9.5 01/26/2020 1326   CALCIUM 9.7 08/01/2016 1527   PROT 6.8 01/26/2020 1326   PROT 7.2 08/01/2016 1527   PROT 6.8 08/01/2016 1527   ALBUMIN 4.5 01/26/2020 1326   ALBUMIN 4.0 08/01/2016 1527   AST 38 (H) 01/26/2020 1326   AST 41 (H) 08/01/2016 1527   ALT 30 01/26/2020 1326   ALT 25 08/01/2016 1527   ALKPHOS 44 01/26/2020 1326   ALKPHOS 48 08/01/2016 1527   BILITOT 0.6 01/26/2020 1326   BILITOT 0.33 08/01/2016 1527   GFRNONAA 66 05/28/2019 1609   GFRAA 76 05/28/2019 1609       Component Value Date/Time   WBC 5.1 01/26/2020 1326   RBC 4.03 01/26/2020 1326   HGB 12.5 01/26/2020 1326   HGB 9.5 (L) 08/01/2016 1527   HCT 37.2 01/26/2020 1326   HCT 31.2 (L) 08/01/2016 1527   PLT 195.0 01/26/2020 1326   PLT 221 08/01/2016 1527   MCV 92.4 01/26/2020 1326   MCV 83.9 08/01/2016 1527   MCH 30.4 10/17/2018 1204   MCHC 33.7 01/26/2020 1326   RDW 13.2 01/26/2020 1326   RDW 17.2 (H) 08/01/2016 1527   LYMPHSABS 1.5  01/26/2020 1326   LYMPHSABS 2.0 08/01/2016 1527   MONOABS 0.4 01/26/2020 1326   MONOABS 0.4 08/01/2016 1527   EOSABS 0.1 01/26/2020 1326   EOSABS 0.1 08/01/2016 1527   BASOSABS 0.0 01/26/2020 1326   BASOSABS 0.0 08/01/2016 1527    No results found for: POCLITH, LITHIUM   No results found for: PHENYTOIN, PHENOBARB, VALPROATE, CBMZ   Endo checked thyroid lately.   Vitamin D level on April 01, 2018 reported as 40.  After that vitamin D 50,000 units weekly was prescribed with a goal of achieving levels in the 50s and 60s.  December 04, 2018 vitamin D 25 off supplement .res Assessment: Plan:    Amirah was seen today for major depressive disorder, recurrent episode, moderate (hcc), follow-up and sleeping problem.  Diagnoses and all orders for this visit:  Major depressive disorder, recurrent episode, moderate (HCC) -     DULoxetine (CYMBALTA) 30 MG capsule; Take 3 capsules (90 mg total) by mouth daily.  Seasonal depression ([place]) -     DULoxetine (CYMBALTA) 30 MG capsule; Take 3 capsules (90 mg total) by mouth daily.  Panic disorder with agoraphobia -     DULoxetine (CYMBALTA) 30 MG capsule; Take 3 capsules (90 mg total) by mouth daily.  Generalized anxiety disorder -     DULoxetine (CYMBALTA) 30 MG capsule; Take 3 capsules (90 mg total) by mouth daily.  Insomnia due to mental condition -     mirtazapine (REMERON) 7.5 MG tablet; Take 1 tablet (7.5 mg total) by mouth at bedtime.  Delayed sleep phase syndrome     Greater than 50% of  30 min face to face time with patient was spent on counseling and coordination of care. We discussed several things.TRD and wants change.  We discussed options for dealing with this. Pt has mixed sx so consider unconventional alternatives.  Discussed potential benefits, risks, and side effects of stimulants with patient to include increased heart rate, palpitations, insomnia, increased anxiety, increased irritability, or decreased appetite.   Instructed patient to contact office if experiencing any significant tolerability issues. She does not appear manic and not sig different from the last visit. Still talkative on it but not manic.  Options pramipexole, Rexulti.  Disc SE and options.     CO poor effect with Viibryd and duloxetine did help FM more and pain. Better with switch back to duloxetine 90 mg daily (*took 120 mg before)  Continue mirtazapine 7.5 mg HS. Educated that she cannot expect to sleep solid through the night.  Explained.   Also disc sleep hygiene.    Option counseling but availability is limited for options. But on list for [facility]inal[facility] Cloud  Repeat vitamin D level was 47.  Previously high.  Restarted 2000 units daily.   Been off supplement since July 2021    FU 3-4 mos  Needs to be seen in fall sometime  Lynder Parents, MD, DFAPA   Please see After Visit Summary for patient specific instructions.  Future Appointments  Date Time Provider [place]  08/18/2020  2:00 PM [facility]  08/18/2020  3:00 PM Shanon Ace, LCSW [facility] None  09/13/2020  1:00 PM [name], Reynold Bowen, MD [facility]-PC[facility] None  10/07/2020  1:30 PM [facility] MM 2 [facility]    No orders of the defined types were placed in this encounter.     -------------------------------

## 2020-08-18 ENCOUNTER — Other Ambulatory Visit: Payer: Self-pay | Admitting: Family Medicine

## 2020-08-18 ENCOUNTER — Ambulatory Visit (INDEPENDENT_AMBULATORY_CARE_PROVIDER_SITE_OTHER): Payer: Medicare PPO | Admitting: General Practice

## 2020-08-18 ENCOUNTER — Other Ambulatory Visit: Payer: Self-pay

## 2020-08-18 ENCOUNTER — Ambulatory Visit (INDEPENDENT_AMBULATORY_CARE_PROVIDER_SITE_OTHER): Payer: Medicare PPO | Admitting: Psychiatry

## 2020-08-18 DIAGNOSIS — F331 Major depressive disorder, recurrent, moderate: Secondary | ICD-10-CM

## 2020-08-18 DIAGNOSIS — I2699 Other pulmonary embolism without acute cor pulmonale: Secondary | ICD-10-CM | POA: Diagnosis not present

## 2020-08-18 DIAGNOSIS — I10 Essential (primary) hypertension: Secondary | ICD-10-CM | POA: Diagnosis not present

## 2020-08-18 DIAGNOSIS — Z7901 Long term (current) use of anticoagulants: Secondary | ICD-10-CM

## 2020-08-18 LAB — POCT INR: INR: 4.3 — AB (ref 2.0–3.0)

## 2020-08-18 NOTE — Patient Instructions (Addendum)
Pre visit review using our clinic review tool, if applicable. No additional management support is needed unless otherwise documented below in the visit note.  Continue to take 1/2 tablet on Sunday  Monday Wed and Fridays and take 1 tablet on Tues, Thurs and Saturday.   Re-check in 3 weeks.

## 2020-08-18 NOTE — Progress Notes (Signed)
I have reviewed and agree with this plan   Lalia Loudon, NP  

## 2020-08-18 NOTE — Progress Notes (Signed)
[facility] Counselor/Therapist Progress Note  Patient ID: Andrea Simmons, MRN: [id number],    Date: 08/18/2020  Time Spent:  50 minutes  Treatment Type: Individual Therapy  Reported Symptoms: anxiety, "my depression isn't giving me problems now"  Mental Status Exam:  Appearance:   Well Groomed     Behavior:  Appropriate, Sharing and Motivated  Motor:  Normal  Speech/Language:   Clear and Coherent  Affect:  anxious  Mood:  anxious  Thought process:  goal directed  Thought content:    some ruminating  Sensory/Perceptual disturbances:    WNL  Orientation:  oriented to person, place, time/date, situation, day of week, month of year and year  Attention:  Good  Concentration:  Good and Fair  Memory:  WNL  Fund of knowledge:   Good  Insight:    Good  Judgment:   Good  Impulse Control:  Good   Risk Assessment: Danger to Self:  No Self-injurious Behavior: No Danger to Others: No Duty to Warn:no Physical Aggression / Violence:No  Access to Firearms a concern: No  Gang Involvement:No   Subjective:  Patient in today reporting that her depression has decreased a lot and she mostly "just has anxiety now".  Anxiety daily and sometimes is worsened by circumstances. Affect is more full and "happier" today.  With her Seasonal Affective Disorder, she experiences more depression in the fall and winter months, with the spring and summer months having less depressive symptoms. Husband is still recovering from his hematoma on his hip. States she stays inside a lot and to herself.  Talked almost non-stop today, and I got the impression that she doesn't spend much time with others.  Has lost a lot of her friends who died. Suggested that she might find ways to be around people more and she agreed.  States they've stayed inside more during Covid. States she and her husband don't talk much as "he is a Nurse, adult and is always watching sports or whatever." Bothered with some pain with her OA,  Fibromyalgia, and recently diagnosed gout in hand joints. (See Progress/Plan below.)   Interventions: Solution-Oriented/Positive Psychology and Ego-Supportive  Diagnosis:   ICD-10-CM   1. Major depressive disorder, recurrent episode, moderate (HCC)  F33.1      Treatment Goal Plan: Patient not signing treatment plan on computer screen due to Covid. Treatment Goals: Goals remain on tx plan as patient works with strategies to achieve her goals. Long term goal: Develop healthy cognitive beliefs about self and the world that lead to alleviation of depression and help prevent relapse of depression. Short term goal: Identify and replace depressive thinking that leads to depressive feelings and actions. Strategies: Replace negative self-defeating self-talk with verbalization of realistic and positive messages.    Progress / Plan:  Patient today very talkative, pleasant, and reporting anxiety, with her depression having decreased. Due to her Seasonal Affective Disorder, her depression is more prominent in the fall and winter months. States she is staying on her meds as prescribed. Also encouraged her to get outside some each day and walk as she is able, stay in the present and focus on what she can control, allow for healthy pattern of sleep, stay in contact with people who are supportive of her, look for more positives each day, consistently practicing positive self-talk and self-care, find more opportunities to be in contact with other people, and realize the strength she shows as she works with her goal-directed behavior to alleviate anxiety and  help prevent relapse of depression.  Goal review and progress/challenges noted with patient.  Next appt within 1 month.   Shanon Ace, LCSW

## 2020-09-06 ENCOUNTER — Other Ambulatory Visit: Payer: Self-pay

## 2020-09-06 ENCOUNTER — Ambulatory Visit (INDEPENDENT_AMBULATORY_CARE_PROVIDER_SITE_OTHER): Payer: Medicare PPO | Admitting: General Practice

## 2020-09-06 DIAGNOSIS — Z85828 Personal history of other malignant neoplasm of skin: Secondary | ICD-10-CM | POA: Diagnosis not present

## 2020-09-06 DIAGNOSIS — I2699 Other pulmonary embolism without acute cor pulmonale: Secondary | ICD-10-CM | POA: Diagnosis not present

## 2020-09-06 DIAGNOSIS — Z7901 Long term (current) use of anticoagulants: Secondary | ICD-10-CM

## 2020-09-06 DIAGNOSIS — L218 Other seborrheic dermatitis: Secondary | ICD-10-CM | POA: Diagnosis not present

## 2020-09-06 DIAGNOSIS — D0462 Carcinoma in situ of skin of left upper limb, including shoulder: Secondary | ICD-10-CM | POA: Diagnosis not present

## 2020-09-06 DIAGNOSIS — L570 Actinic keratosis: Secondary | ICD-10-CM | POA: Diagnosis not present

## 2020-09-06 LAB — POCT INR: INR: 3.1 — AB (ref 2.0–3.0)

## 2020-09-06 NOTE — Patient Instructions (Addendum)
Pre visit review using our clinic review tool, if applicable. No additional management support is needed unless otherwise documented below in the visit note.  Skip dosage today and then continue to take 1/2 tablet on Sunday  Monday Wed and Fridays and take 1 tablet on Tues, Thurs and Saturday.   Re-check in 4 weeks.

## 2020-09-12 ENCOUNTER — Other Ambulatory Visit: Payer: Self-pay

## 2020-09-12 ENCOUNTER — Emergency Department (HOSPITAL_COMMUNITY)
Admission: EM | Admit: 2020-09-12 | Discharge: 2020-09-12 | Disposition: A | Discharge status: Home / Self Care | Payer: Medicare PPO | Attending: Emergency Medicine | Admitting: Emergency Medicine

## 2020-09-12 ENCOUNTER — Emergency Department (HOSPITAL_COMMUNITY): Payer: Medicare PPO

## 2020-09-12 ENCOUNTER — Encounter (HOSPITAL_COMMUNITY): Payer: Self-pay

## 2020-09-12 DIAGNOSIS — R1110 Vomiting, unspecified: Secondary | ICD-10-CM | POA: Diagnosis not present

## 2020-09-12 DIAGNOSIS — Z853 Personal history of malignant neoplasm of breast: Secondary | ICD-10-CM | POA: Insufficient documentation

## 2020-09-12 DIAGNOSIS — M25431 Effusion, right wrist: Secondary | ICD-10-CM

## 2020-09-12 DIAGNOSIS — Z7901 Long term (current) use of anticoagulants: Secondary | ICD-10-CM | POA: Diagnosis not present

## 2020-09-12 DIAGNOSIS — E039 Hypothyroidism, unspecified: Secondary | ICD-10-CM | POA: Insufficient documentation

## 2020-09-12 DIAGNOSIS — Z7984 Long term (current) use of oral hypoglycemic drugs: Secondary | ICD-10-CM | POA: Diagnosis not present

## 2020-09-12 DIAGNOSIS — M25531 Pain in right wrist: Secondary | ICD-10-CM

## 2020-09-12 DIAGNOSIS — R2231 Localized swelling, mass and lump, right upper limb: Secondary | ICD-10-CM | POA: Diagnosis not present

## 2020-09-12 DIAGNOSIS — I10 Essential (primary) hypertension: Secondary | ICD-10-CM | POA: Diagnosis not present

## 2020-09-12 DIAGNOSIS — Z79899 Other long term (current) drug therapy: Secondary | ICD-10-CM | POA: Diagnosis not present

## 2020-09-12 DIAGNOSIS — L539 Erythematous condition, unspecified: Secondary | ICD-10-CM | POA: Insufficient documentation

## 2020-09-12 DIAGNOSIS — E119 Type 2 diabetes mellitus without complications: Secondary | ICD-10-CM | POA: Insufficient documentation

## 2020-09-12 DIAGNOSIS — M79641 Pain in right hand: Secondary | ICD-10-CM | POA: Diagnosis not present

## 2020-09-12 MED ORDER — PREDNISONE 10 MG PO TABS: 40.0000 mg | ORAL_TABLET | Freq: Every day | ORAL | 0 refills | Status: DC

## 2020-09-12 MED ORDER — HYDROCODONE-ACETAMINOPHEN 5-325 MG PO TABS
1.0000 | ORAL_TABLET | Freq: Once | ORAL | Status: AC
  Administered 2020-09-12: 1 via ORAL
  Filled 2020-09-12: qty 1

## 2020-09-12 MED ORDER — HYDROCODONE-ACETAMINOPHEN 5-325 MG PO TABS: 1.0000 | ORAL_TABLET | Freq: Four times a day (QID) | ORAL | 0 refills | Status: DC | PRN

## 2020-09-12 NOTE — ED Triage Notes (Signed)
Pov from home with cc of gout to the right hand since Thursday

## 2020-09-12 NOTE — Discharge Instructions (Addendum)
I am prescribing you a strong steroid medication called prednisone.  Please only take this as prescribed.  Please take it early in the morning, as this medication can be stimulating and make it difficult to sleep at night.  I have prescribed you a strong narcotic called Vicodin. Please only take this as prescribed. This medication also has tylenol in it, so please be sure you are not taking more than 3000 mg of tylenol per day. Do not drive or operate heavy machinery after taking this medication. Do not mix it with alcohol.   Please return to the emergency department if you develop any new or worsening symptoms.  I would also recommend you follow-up with your orthopedic doctor regarding your wrist symptoms.  It was a pleasure to meet you.

## 2020-09-12 NOTE — ED Provider Notes (Signed)
[facility] EMERGENCY DEPARTMENT Provider Note   CSN: [id number] Arrival date & time: 09/12/20  1905     History Chief Complaint  Patient presents with   Gout    R hand     Andrea Simmons is a 80 y.o. female.  HPI  Patient is a 80 year old female with a history of gout who presents to the emergency department due to right wrist pain and swelling that started about 3 days ago.  No trauma to the region.  Patient states that she has a history of gout and notes previous gout in the right wrist and hand.  Reports increased warmth in the joint.  Pain worsens with palpation and movement.  Has been taking acetaminophen with little relief.  She states that she was prescribed prednisone by her rheumatologist last month with provided relief for symptoms.  She reports an episode of vomiting but no current nausea.  No fevers or chills.  No other complaints.     Past Medical History:  Diagnosis Date   Anemia    Anxiety    Breast cancer ([address])    Depression    Diabetes mellitus type II    Fibromyalgia    GERD (gastroesophageal reflux disease)    Hyperlipidemia    Hypertension    Malignant neoplasm of breast (female), unspecified site 1993   L breast s/p mastectomy and tamoxifen x 38yr   Other pulmonary embolism and infarction 2008 and 2009   chronic anticoag - [facility]   Unspecified hypothyroidism     Patient Active Problem List   Diagnosis Date Noted   Vitamin D deficiency 10/01/2019   Polypharmacy 10/01/2019   Major depressive disorder, recurrent episode, moderate ([place] 10/01/2019   Leg edema 06/04/2019   Chest pain of uncertain etiology 141/93/7902  Hx of pulmonary embolus 03/30/2017   Physical exam 11/30/2016   Hyperlipidemia 12/26/2013   Encounter for therapeutic drug monitoring 04/16/2013   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   Long term (current) use of anticoagulants 06/30/2010   Exertional dyspnea 11/09/2008   Diabetes type 2,  controlled ([place] 08/27/2008   ANEMIA-NOS 08/27/2008   Seasonal and perennial allergic rhinitis 08/27/2008   PULMONARY NODULE 08/27/2008   Fibromyalgia 08/27/2008   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 ([place] 03/20/2007   Hypothyroidism 03/20/2007   Essential hypertension 03/20/2007   Recurrent pulmonary emboli ([place] 03/20/2007   GERD 03/20/2007   ESOPHAGEAL STRICTURE 03/08/2007    Past Surgical History:  Procedure Laterality Date   APPENDECTOMY     BREAST BIOPSY Left 1993   BREAST BIOPSY Right 09/25/2014   stero. Benign   BREAST RECONSTRUCTION Left    CATARACT EXTRACTION     MASTECTOMY Left    ROTATOR CUFF REPAIR     SPINAL FUSION      x 2   TOTAL ABDOMINAL HYSTERECTOMY W/ BILATERAL SALPINGOOPHORECTOMY     TUBAL LIGATION       OB History   No obstetric history on file.     Family History  Problem Relation Age of Onset   Depression Other        Parent   Arthritis Other        Parent, Grandparent   Hypertension Other        Grandparent   [place]  Miscarriages / Stillbirths Other        Grandparent   Stroke Other  [place]   Cancer Maternal Uncle        prostate   Hypertension Maternal Grandfather    Breast cancer Cousin    Breast cancer Cousin     Social History   Tobacco Use   Smoking status: Never   Smokeless tobacco: Never  Vaping Use   Vaping Use: Never used  Substance Use Topics   Alcohol use: No    Alcohol/week: 0.0 standard drinks   Drug use: No    Home Medications Prior to Admission medications   Medication Sig Start Date End Date Taking? Authorizing Provider  HYDROcodone-acetaminophen (NORCO/VICODIN) 5-325 MG tablet Take 1 tablet by mouth every 6 (six) hours as needed. 09/12/20  Yes Rayna Sexton, PA-C  predniSONE (DELTASONE) 10 MG tablet Take 4 tablets (40 mg total) by mouth daily with breakfast for 5 days. 09/12/20 09/17/20 Yes Rayna Sexton, PA-C  Accu-Chek Softclix Lancets lancets USE AS INSTRUCTED TO TEST  SUGARS TWICE A DAY 08/18/20   [name], MD  amLODipine (NORVASC) 10 MG tablet TAKE 1 TABLET BY MOUTH EVERY DAY 05/17/20   [name], Reynold Bowen, MD  BIOTIN PO Take by mouth. [place]    [provider]  Blood Glucose Monitoring Suppl (ACCU-CHEK GUIDE) w/Device KIT 1 each by Does not apply route 2 (two) times daily. To test sugars. Dx. E11.9 06/23/19   [name], MD  DULoxetine (CYMBALTA) 30 MG capsule Take 3 capsules (90 mg total) by mouth daily. 08/12/20   [name], Billey Co., MD  fenofibrate (TRICOR) 48 MG tablet Take 1 tablet by mouth daily. 10/06/19   [provider]  furosemide (LASIX) 20 MG tablet TAKE 1 TABLET BY MOUTH AS NEEDED Patient taking differently: Take 20 mg by mouth daily. Only takes as needed 08/27/19   [name], [name], MD  glucose blood (ACCU-CHEK GUIDE) test strip CHECK BLOOD SUGARS DAILY 08/20/19   [name], MD  levothyroxine (SYNTHROID) 25 MCG tablet TAKE 1 TABLET BY MOUTH EVERY DAY BEFORE BREAKFAST 04/28/20   [name], MD  Magnesium 500 MG TABS Take by mouth.    [provider]  metFORMIN (GLUCOPHAGE-XR) 500 MG 24 hr tablet Take 500 mg by mouth daily with breakfast. 03/23/16   [provider]  metoprolol succinate (TOPROL-XL) 25 MG 24 hr tablet Take 1 tablet (25 mg total) by mouth daily. Take with or immediately following a meal. 06/11/20 09/09/20  [name], Reynold Bowen, MD  mirtazapine (REMERON) 7.5 MG tablet Take 1 tablet (7.5 mg total) by mouth at bedtime. 08/12/20   [name], Billey Co., MD  nitroGLYCERIN (NITROSTAT) 0.4 MG SL tablet Place 1 tablet (0.4 mg total) under the tongue every 5 (five) minutes as needed for chest pain. 10/31/18 03/03/21  [name], Reynold Bowen, MD  RESTASIS MULTIDOSE 0.05 % ophthalmic emulsion Place 1 drop into both eyes 2 (two) times daily.  06/21/16   [provider]  rosuvastatin (CRESTOR) 20 MG tablet Take 20 mg by mouth daily.    [provider]   valsartan-hydrochlorothiazide (DIOVAN-HCT) 320-25 MG tablet TAKE 1 TABLET BY MOUTH EVERY DAY IN THE MORNING 05/14/20   [name], Reynold Bowen, MD  Vitamin D, Ergocalciferol, (DRISDOL) 1.25 MG (50000 UNIT) CAPS capsule TAKE 1 CAPSULE BY MOUTH ONE TIME PER WEEK 06/17/20   [name], Billey Co., MD  warfarin (COUMADIN) 5 MG tablet 62m on Sunday, 2.56mAll other days 02/06/20   TaMidge MiniumMD    Allergies  Anaprox [naproxen sodium], Lipitor [atorvastatin], Morphine, Meperidine, and Naproxen sodium  Review of Systems   Review of Systems  Constitutional:  Negative for fever.  Gastrointestinal:  Negative for nausea.  Musculoskeletal:  Positive for arthralgias and joint swelling.  Skin:  Positive for color change. Negative for wound.   Physical Exam Updated Vital Signs BP (!) 149/69 (BP Location: Right Arm)   Pulse 79   Temp 97.7 F (36.5 C) (Oral)   Resp 17   Ht '5\' 4"'  (1.626 m)   Wt 68 kg   SpO2 98%   BMI 25.75 kg/m   Physical Exam Vitals and nursing note reviewed.  Constitutional:      General: She is not in acute distress.    Appearance: Normal appearance. She is not ill-appearing, toxic-appearing or diaphoretic.  HENT:     Head: Normocephalic and atraumatic.     Right Ear: External ear normal.     Left Ear: External ear normal.     Nose: Nose normal.     Mouth/Throat:     Mouth: Mucous membranes are moist.     Pharynx: Oropharynx is clear. No oropharyngeal exudate or posterior oropharyngeal erythema.  Eyes:     Extraocular Movements: Extraocular movements intact.  Cardiovascular:     Rate and Rhythm: Normal rate.     Pulses: Normal pulses.  Pulmonary:     Effort: Pulmonary effort is normal.  Abdominal:     General: Abdomen is flat.     Tenderness: There is no abdominal tenderness.  Musculoskeletal:        General: Swelling and tenderness present. Normal range of motion.     Cervical back: Normal range of motion and neck supple. No tenderness.     Comments:  Moderate TTP noted diffusely along the right wrist.  Mild swelling in the joint with erythema and increased warmth.  2+ radial pulses.  Grip strength intact.  Good cap refill in all 5 fingers.  Distal sensation intact.  Skin:    General: Skin is warm and dry.     Findings: Erythema present.  Neurological:     General: No focal deficit present.     Mental Status: She is alert and oriented to person, place, and time.  Psychiatric:        Mood and Affect: Mood normal.        Behavior: Behavior normal.    ED Results / Procedures / Treatments   Labs (all labs ordered are listed, but only abnormal results are displayed) Labs Reviewed - No data to display  EKG None  Radiology DG Wrist Complete Right  Result Date: 09/12/2020 CLINICAL DATA:  Right wrist pain.  No known injury. EXAM: RIGHT WRIST - COMPLETE 3+ VIEW COMPARISON:  None. FINDINGS: Arthritic changes throughout the right wrist, most pronounced at the 1st carpometacarpal joint with joint space narrowing and spurring. No acute bony abnormality. Specifically, no fracture, subluxation, or dislocation. 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 No acute bony abnormality. Electronically Signed   By: Rolm Baptise M.D.   On: 09/12/2020 20:58    Procedures Procedures   Medications Ordered in ED Medications  HYDROcodone-acetaminophen (NORCO/VICODIN) 5-325 MG per tablet 1 tablet (1 tablet Oral Given 09/12/20 2033)    ED Course  I have reviewed the triage vital signs and the nursing notes.  Pertinent labs & imaging results that were available during my care of the patient were reviewed by me and considered in my medical decision making (see  chart for details).    MDM Rules/Calculators/A&P                          Patient is a 80 year old female with a history of gout who presents to the emergency department due to atraumatic right wrist pain and swelling that started 3 days ago.  History of gout in the right  wrist and hand.  I obtained x-ray showing moderate to advanced arthritic changes in the right wrist but no acute bony abnormalities.  Feel that this is likely another gout exacerbation.  No fevers or tachycardia.  No red streaking.  Patient is anticoagulated so cannot prescribe NSAIDs.  Will discharge on a short course of Vicodin for breakthrough pain.  She has taken this in the past.  We discussed safety regarding this medication.  Will provide a short course of prednisone.  She is a diabetic and understands that this could likely affect her blood sugars.    Patient is planning on following up with her regular doctor next week.  We discussed return precautions.  Her questions were answered and she was amicable at the time of discharge.  Final Clinical Impression(s) / ED Diagnoses Final diagnoses:  Pain and swelling of right wrist   Rx / DC Orders ED Discharge Orders          Ordered    HYDROcodone-acetaminophen (NORCO/VICODIN) 5-325 MG tablet  Every 6 hours PRN        09/12/20 2141    predniSONE (DELTASONE) 10 MG tablet  Daily with breakfast        09/12/20 2141             Rayna Sexton, PA-C 09/12/20 2144    Davonna Belling, MD 09/12/20 2351

## 2020-09-12 NOTE — Progress Notes (Signed)
 Follow up visit  Subjective:   Andrea Simmons, female    DOB: 09/07/1940, 80 y.o.   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 Blood pressure is elevated home, around SBP 140s, better here today.  Current Outpatient Medications on File Prior to Visit  Medication Sig Dispense Refill   Accu-Chek Softclix Lancets lancets USE AS INSTRUCTED TO TEST SUGARS TWICE A DAY 100 each 12   acetaminophen (TYLENOL) 500 MG tablet Take 500 mg by mouth every 6 (six) hours as needed.     amLODipine (NORVASC) 10 MG tablet TAKE 1 TABLET BY MOUTH EVERY DAY 90 tablet 2   BIOTIN PO Take by mouth. [facility] [employer] [facility] [place]     Blood Glucose Monitoring Suppl (ACCU-CHEK GUIDE) w/Device KIT 1 each by Does not apply route 2 (two) times daily. To test sugars. Dx. E11.9 1 kit 1   DULoxetine (CYMBALTA) 30 MG capsule Take 3 capsules (90 mg total) by mouth daily. 270 capsule 1   fenofibrate (TRICOR) 48 MG tablet Take 1 tablet by mouth daily.     furosemide (LASIX) 20 MG tablet TAKE 1 TABLET BY MOUTH AS NEEDED (Patient taking differently: Take 20 mg by mouth daily. Only takes as needed) 90 tablet 1   glucose blood (ACCU-CHEK GUIDE) test strip CHECK BLOOD SUGARS DAILY 100 strip 12   levothyroxine (SYNTHROID) 25 MCG tablet TAKE 1 TABLET BY MOUTH EVERY DAY BEFORE BREAKFAST 90 tablet 1   Magnesium 500 MG TABS Take by mouth.     metFORMIN (GLUCOPHAGE-XR) 500 MG 24 hr tablet Take 500 mg by mouth daily with breakfast.  3   metoprolol succinate (TOPROL-XL) 25 MG 24 hr tablet Take 1 tablet (25 mg total) by mouth daily. Take with or immediately following a meal. 90 tablet 2   mirtazapine  (REMERON) 7.5 MG tablet Take 1 tablet (7.5 mg total) by mouth at bedtime. 90 tablet 1   nitroGLYCERIN (NITROSTAT) 0.4 MG SL tablet Place 1 tablet (0.4 mg total) under the tongue every 5 (five) minutes as needed for chest pain. 30 tablet 3   RESTASIS MULTIDOSE 0.05 % ophthalmic emulsion Place 1 drop into both eyes 2 (two) times daily.   6   rosuvastatin (CRESTOR) 20 MG tablet Take 20 mg by mouth daily.     valsartan-hydrochlorothiazide (DIOVAN-HCT) 320-25 MG tablet TAKE 1 TABLET BY MOUTH EVERY DAY IN THE MORNING 90 tablet 2   Vitamin D, Ergocalciferol, (DRISDOL) 1.25 MG (50000 UNIT) CAPS capsule TAKE 1 CAPSULE BY MOUTH ONE TIME PER WEEK 12 capsule 1   warfarin (COUMADIN) 5 MG tablet 5mg on Sunday, 2.5mg All other days 70 tablet 2   No current facility-administered medications on file prior to visit.    Cardiovascular studies:  EKG 03/14/2020: Sinus rhythm Right bundle branch block Left anterior fascicular block  Lexiscan myoview stress test 01/30/2018: 1. The resting electrocardiogram demonstrated normal sinus rhythm, LAD, LAFB, RBBB and no resting arrhythmias.  The stress electrocardiogram was non-diagnostic due to pharmacologic stress. Stress symptoms included dyspnea. Resting BP 166/86 and peak BP 202/88 mm Hg.  2. Myocardial perfusion imaging is normal. Overall left ventricular systolic function   was normal without regional wall motion abnormalities. The left ventricular ejection fraction was 56%.  This is a low risk study.  Echocardiogram 01/28/2018:  Left ventricle cavity is normal in size. Moderate concentric hypertrophy of the left ventricle. Normal global wall motion. Doppler evidence of grade I (impaired) diastolic dysfunction, normal LAP. Calculated EF 55%. Left atrial cavity is mildly dilated. Mild (Grade I) aortic regurgitation. Mild (Grade I) mitral regrgitation. Trace tricuspid regurgitation. Inadequate tricuspid regurgitation jet to estimate pulmonary artery pressure. Normal  right atrial pressure.  Recent labs: 01/26/2020: Glucose 99, BUN/Cr 25/0.93. EGFR 59. Na/K 139/4.3. AST 38. Rest of the CMP normal H/H 12/37. MCV 92. Platelets 195 HbA1C 6.7% Chol 140, TG 77, HDL 51, LDL 73 TSH 1.5 normal  05/28/2019: Glucose 119, BUN/Cr 17/0.85. EGFR 66. Na/K 140/4.5.   10-02/2019: Glucose 96, BUN/Cr 20/0.74. EGFR 79. Na/K 139/4.4. Rest of the CMP normal H/H 12/36. MCV 91. Platelets 247 Chol 133, TG 119, HDL 38, LDL 71 TSH 0.9 normal   10/17/2018: Glucose 98, BUN/Cr 20/0.8. EGFR >60. Na/K 140/4.2. Rest of the CMP normal   Review of Systems  Cardiovascular:  Negative for chest pain, dyspnea on exertion, leg swelling, palpitations and syncope.       Vitals:   09/13/20 1305  BP: 125/67  Pulse: 70  Resp: 16  Temp: 98.2 F (36.8 C)  SpO2: 96%      Objective:   Physical Exam Vitals and nursing note reviewed.  Constitutional:      General: She is not in acute distress. Neck:     Vascular: No JVD.  Cardiovascular:     Rate and Rhythm: Normal rate and regular rhythm.     Pulses: Intact distal pulses.     Heart sounds: Normal heart sounds. No murmur heard.    Comments: Bilateral LE varcocities Pulmonary:     Effort: Pulmonary effort is normal.     Breath sounds: Normal breath sounds. No wheezing or rales.        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 Change metoprolol succinate 25 mg daily to labetalol 100 mg bid. Continue amlodipine 5 mg daily.   Leg edema: Improved with compression stockings.  F/u in 3 months.   Nigel Mormon, MD [facility] Pager: [phone] Office: [phone] If no answer Cell [phone]

## 2020-09-13 ENCOUNTER — Ambulatory Visit: Payer: Medicare PPO | Admitting: Cardiology

## 2020-09-13 ENCOUNTER — Telehealth: Payer: Self-pay

## 2020-09-13 ENCOUNTER — Encounter: Payer: Self-pay | Admitting: Cardiology

## 2020-09-13 VITALS — BP 125/67 | HR 70 | Temp 98.2°F | Resp 16 | Ht 64.0 in | Wt 155.0 lb

## 2020-09-13 DIAGNOSIS — I10 Essential (primary) hypertension: Secondary | ICD-10-CM

## 2020-09-13 DIAGNOSIS — R600 Localized edema: Secondary | ICD-10-CM

## 2020-09-13 MED ORDER — LABETALOL HCL 100 MG PO TABS
100.0000 mg | ORAL_TABLET | Freq: Two times a day (BID) | ORAL | 2 refills | Status: DC
Start: 2020-09-13 — End: 2021-01-20

## 2020-09-13 MED ORDER — VALSARTAN 320 MG PO TABS: 320.0000 mg | ORAL_TABLET | Freq: Every day | ORAL | 2 refills | Status: DC

## 2020-09-15 ENCOUNTER — Encounter: Payer: Self-pay | Admitting: *Deleted

## 2020-09-16 ENCOUNTER — Emergency Department (HOSPITAL_COMMUNITY): Payer: Medicare PPO

## 2020-09-16 ENCOUNTER — Inpatient Hospital Stay (HOSPITAL_COMMUNITY)
Admission: EM | Admit: 2020-09-16 | Discharge: 2020-09-18 | DRG: 392 | Disposition: A | Discharge status: Home / Self Care | Payer: Medicare PPO | Attending: Internal Medicine | Admitting: Internal Medicine

## 2020-09-16 ENCOUNTER — Ambulatory Visit (INDEPENDENT_AMBULATORY_CARE_PROVIDER_SITE_OTHER): Payer: Medicare PPO | Admitting: Family Medicine

## 2020-09-16 ENCOUNTER — Ambulatory Visit: Payer: Self-pay

## 2020-09-16 ENCOUNTER — Encounter (HOSPITAL_COMMUNITY): Payer: Self-pay | Admitting: *Deleted

## 2020-09-16 ENCOUNTER — Encounter: Payer: Self-pay | Admitting: Family Medicine

## 2020-09-16 ENCOUNTER — Other Ambulatory Visit: Payer: Self-pay

## 2020-09-16 DIAGNOSIS — R748 Abnormal levels of other serum enzymes: Secondary | ICD-10-CM | POA: Diagnosis not present

## 2020-09-16 DIAGNOSIS — Z823 Family history of stroke: Secondary | ICD-10-CM | POA: Diagnosis not present

## 2020-09-16 DIAGNOSIS — E1165 Type 2 diabetes mellitus with hyperglycemia: Secondary | ICD-10-CM | POA: Diagnosis present

## 2020-09-16 DIAGNOSIS — Z79899 Other long term (current) drug therapy: Secondary | ICD-10-CM

## 2020-09-16 DIAGNOSIS — R7401 Elevation of levels of liver transaminase levels: Secondary | ICD-10-CM | POA: Diagnosis present

## 2020-09-16 DIAGNOSIS — Z79890 Hormone replacement therapy (postmenopausal): Secondary | ICD-10-CM

## 2020-09-16 DIAGNOSIS — E785 Hyperlipidemia, unspecified: Secondary | ICD-10-CM | POA: Diagnosis not present

## 2020-09-16 DIAGNOSIS — R001 Bradycardia, unspecified: Secondary | ICD-10-CM | POA: Diagnosis present

## 2020-09-16 DIAGNOSIS — D7389 Other diseases of spleen: Secondary | ICD-10-CM | POA: Diagnosis not present

## 2020-09-16 DIAGNOSIS — Z8249 Family history of ischemic heart disease and other diseases of the circulatory system: Secondary | ICD-10-CM

## 2020-09-16 DIAGNOSIS — R1310 Dysphagia, unspecified: Secondary | ICD-10-CM | POA: Diagnosis not present

## 2020-09-16 DIAGNOSIS — Z7984 Long term (current) use of oral hypoglycemic drugs: Secondary | ICD-10-CM

## 2020-09-16 DIAGNOSIS — Z9889 Other specified postprocedural states: Secondary | ICD-10-CM | POA: Diagnosis not present

## 2020-09-16 DIAGNOSIS — D649 Anemia, unspecified: Secondary | ICD-10-CM | POA: Diagnosis present

## 2020-09-16 DIAGNOSIS — M25531 Pain in right wrist: Secondary | ICD-10-CM

## 2020-09-16 DIAGNOSIS — E860 Dehydration: Secondary | ICD-10-CM

## 2020-09-16 DIAGNOSIS — R1314 Dysphagia, pharyngoesophageal phase: Principal | ICD-10-CM | POA: Diagnosis present

## 2020-09-16 DIAGNOSIS — N179 Acute kidney failure, unspecified: Secondary | ICD-10-CM | POA: Diagnosis present

## 2020-09-16 DIAGNOSIS — Z20822 Contact with and (suspected) exposure to covid-19: Secondary | ICD-10-CM | POA: Diagnosis present

## 2020-09-16 DIAGNOSIS — I2699 Other pulmonary embolism without acute cor pulmonale: Secondary | ICD-10-CM | POA: Diagnosis not present

## 2020-09-16 DIAGNOSIS — Z9012 Acquired absence of left breast and nipple: Secondary | ICD-10-CM

## 2020-09-16 DIAGNOSIS — R1319 Other dysphagia: Secondary | ICD-10-CM | POA: Diagnosis present

## 2020-09-16 DIAGNOSIS — I10 Essential (primary) hypertension: Secondary | ICD-10-CM | POA: Diagnosis not present

## 2020-09-16 DIAGNOSIS — Z7901 Long term (current) use of anticoagulants: Secondary | ICD-10-CM

## 2020-09-16 DIAGNOSIS — F32A Depression, unspecified: Secondary | ICD-10-CM | POA: Diagnosis not present

## 2020-09-16 DIAGNOSIS — I700 Atherosclerosis of aorta: Secondary | ICD-10-CM | POA: Diagnosis not present

## 2020-09-16 DIAGNOSIS — Z7952 Long term (current) use of systemic steroids: Secondary | ICD-10-CM

## 2020-09-16 DIAGNOSIS — E782 Mixed hyperlipidemia: Secondary | ICD-10-CM

## 2020-09-16 DIAGNOSIS — Z981 Arthrodesis status: Secondary | ICD-10-CM

## 2020-09-16 DIAGNOSIS — E039 Hypothyroidism, unspecified: Secondary | ICD-10-CM | POA: Diagnosis present

## 2020-09-16 DIAGNOSIS — Z853 Personal history of malignant neoplasm of breast: Secondary | ICD-10-CM | POA: Diagnosis not present

## 2020-09-16 DIAGNOSIS — Z803 Family history of malignant neoplasm of breast: Secondary | ICD-10-CM

## 2020-09-16 DIAGNOSIS — R0602 Shortness of breath: Secondary | ICD-10-CM | POA: Diagnosis not present

## 2020-09-16 DIAGNOSIS — Z86711 Personal history of pulmonary embolism: Secondary | ICD-10-CM

## 2020-09-16 DIAGNOSIS — D734 Cyst of spleen: Secondary | ICD-10-CM | POA: Diagnosis not present

## 2020-09-16 HISTORY — DX: Dehydration: E86.0

## 2020-09-16 HISTORY — DX: Abnormal levels of other serum enzymes: R74.8

## 2020-09-16 LAB — COMPREHENSIVE METABOLIC PANEL
ALT: 48 U/L — ABNORMAL HIGH (ref 0–44)
AST: 52 U/L — ABNORMAL HIGH (ref 15–41)
Albumin: 3.7 g/dL (ref 3.5–5.0)
Alkaline Phosphatase: 43 U/L (ref 38–126)
Anion gap: 9 (ref 5–15)
BUN: 71 mg/dL — ABNORMAL HIGH (ref 8–23)
CO2: 21 mmol/L — ABNORMAL LOW (ref 22–32)
Calcium: 9.9 mg/dL (ref 8.9–10.3)
Chloride: 106 mmol/L (ref 98–111)
Creatinine, Ser: 1.72 mg/dL — ABNORMAL HIGH (ref 0.44–1.00)
GFR, Estimated: 30 mL/min — ABNORMAL LOW (ref >60)
Glucose, Bld: 218 mg/dL — ABNORMAL HIGH (ref 70–99)
Potassium: 5 mmol/L (ref 3.5–5.1)
Sodium: 136 mmol/L (ref 135–145)
Total Bilirubin: 0.5 mg/dL (ref 0.3–1.2)
Total Protein: 7 g/dL (ref 6.5–8.1)

## 2020-09-16 LAB — CBC WITH DIFFERENTIAL/PLATELET
Abs Immature Granulocytes: 0.04 10*3/uL (ref 0.00–0.07)
Basophils Absolute: 0 10*3/uL (ref 0.0–0.1)
Basophils Relative: 0 %
Eosinophils Absolute: 0 10*3/uL (ref 0.0–0.5)
Eosinophils Relative: 0 %
HCT: 32.8 % — ABNORMAL LOW (ref 36.0–46.0)
Hemoglobin: 10.9 g/dL — ABNORMAL LOW (ref 12.0–15.0)
Immature Granulocytes: 1 %
Lymphocytes Relative: 10 %
Lymphs Abs: 0.6 10*3/uL — ABNORMAL LOW (ref 0.7–4.0)
MCH: 31.5 pg (ref 26.0–34.0)
MCHC: 33.2 g/dL (ref 30.0–36.0)
MCV: 94.8 fL (ref 80.0–100.0)
Monocytes Absolute: 0.1 10*3/uL (ref 0.1–1.0)
Monocytes Relative: 2 %
Neutro Abs: 5.4 10*3/uL (ref 1.7–7.7)
Neutrophils Relative %: 87 %
Platelets: 238 10*3/uL (ref 150–400)
RBC: 3.46 MIL/uL — ABNORMAL LOW (ref 3.87–5.11)
RDW: 13.1 % (ref 11.5–15.5)
WBC: 6.2 10*3/uL (ref 4.0–10.5)
nRBC: 0 % (ref 0.0–0.2)

## 2020-09-16 LAB — LIPASE, BLOOD: Lipase: 62 U/L — ABNORMAL HIGH (ref 11–51)

## 2020-09-16 LAB — D-DIMER, QUANTITATIVE: D-Dimer, Quant: 0.39 ug/mL-FEU (ref 0.00–0.50)

## 2020-09-16 LAB — RESP PANEL BY RT-PCR (FLU A&B, COVID) ARPGX2
Influenza A by PCR: NEGATIVE
Influenza B by PCR: NEGATIVE
SARS Coronavirus 2 by RT PCR: NEGATIVE

## 2020-09-16 LAB — TROPONIN I (HIGH SENSITIVITY)
Troponin I (High Sensitivity): 5 ng/L (ref <18)
Troponin I (High Sensitivity): 4 ng/L (ref <18)

## 2020-09-16 LAB — PROTIME-INR
INR: 2.3 — ABNORMAL HIGH (ref 0.8–1.2)
Prothrombin Time: 25.3 seconds — ABNORMAL HIGH (ref 11.4–15.2)

## 2020-09-16 MED ORDER — INSULIN ASPART 100 UNIT/ML IJ SOLN
0.0000 [IU] | INTRAMUSCULAR | Status: DC
  Administered 2020-09-18: 1 [IU] via SUBCUTANEOUS

## 2020-09-16 MED ORDER — SODIUM CHLORIDE 0.9 % IV BOLUS
250.0000 mL | Freq: Once | INTRAVENOUS | Status: AC
  Administered 2020-09-16: 250 mL via INTRAVENOUS

## 2020-09-16 MED ORDER — SODIUM CHLORIDE 0.9 % IV SOLN: Freq: Once | INTRAVENOUS | Status: AC

## 2020-09-16 MED ORDER — PANTOPRAZOLE SODIUM 40 MG IV SOLR
40.0000 mg | Freq: Once | INTRAVENOUS | Status: AC
  Administered 2020-09-16: 40 mg via INTRAVENOUS
  Filled 2020-09-16: qty 40

## 2020-09-16 MED ORDER — COLCHICINE 0.6 MG PO CAPS: 1.0000 | ORAL_CAPSULE | Freq: Two times a day (BID) | ORAL | 3 refills | Status: DC | PRN

## 2020-09-16 NOTE — ED Provider Notes (Signed)
[facility] EMERGENCY DEPARTMENT Provider Note   CSN: [id number] Arrival date & time: 09/16/20  1354     History Chief Complaint  Patient presents with   Emesis    Andrea Simmons is a 80 y.o. female with past medical history of breast cancer, PE anticoagulated with warfarin, DM2, hypertension, hyperlipidemia, who presents today for evaluation of shortness of breath and difficulty swallowing. She states that over the past few weeks she has had worsening shortness of breath.  This is worse when she is exerting herself and attempting to talk at the same time.  She states that also over this time she has had intermittent feelings of throat fullness and difficulty swallowing.  She states that at point she is able to swallow solid foods and then at other point she is unable to swallow sweet tea or similar.  She cannot pinpoint any specific alleviating or exacerbating factors. She does report occasionally she has had pain in both of her shoulders posteriorly.    She reports compliance with all of her medicines including her warfarin.  She has been having episodes of vomiting over the past 2 weeks.    She denies any abdominal pain.  She has not been taking her PPI for about a year and not had any difficulties.   HPI     Past Medical History:  Diagnosis Date   Anemia    Anxiety    Breast cancer ([place])    Depression    Diabetes mellitus type II    Fibromyalgia    GERD (gastroesophageal reflux disease)    Hyperlipidemia    Hypertension    Malignant neoplasm of breast (female), unspecified site 1993   L breast s/p mastectomy and tamoxifen x 81yr   Other pulmonary embolism and infarction 2008 and 2009   chronic anticoag - [facility] CC   Unspecified hypothyroidism     Patient Active Problem List   Diagnosis Date Noted   Esophageal dysphagia 09/16/2020   Dehydration 09/16/2020   AKI (acute kidney injury) ([place] 09/16/2020   Elevated lipase 09/16/2020   Hyperglycemia due to diabetes  mellitus ([place] 09/16/2020   Vitamin D deficiency 10/01/2019   Polypharmacy 10/01/2019   Major depressive disorder, recurrent episode, moderate ([facility] 10/01/2019   Leg edema 06/04/2019   Chest pain of uncertain etiology 165/46/5035  Hx of pulmonary embolus 03/30/2017   Physical exam 11/30/2016   Hyperlipidemia 12/26/2013   Encounter for therapeutic drug monitoring 04/16/2013   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   Long term (current) use of anticoagulants 06/30/2010   Exertional dyspnea 11/09/2008   Diabetes type 2, controlled ([place] 08/27/2008   ANEMIA-NOS 08/27/2008   Seasonal and perennial allergic rhinitis 08/27/2008   PULMONARY NODULE 08/27/2008   Fibromyalgia 08/27/2008   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 ([place] 03/20/2007   Hypothyroidism 03/20/2007   Essential hypertension 03/20/2007   Recurrent pulmonary emboli ([place] 03/20/2007   GERD 03/20/2007   ESOPHAGEAL STRICTURE 03/08/2007    Past Surgical History:  Procedure Laterality Date   APPENDECTOMY     BREAST BIOPSY Left 1993   BREAST BIOPSY Right 09/25/2014   stero. Benign   BREAST RECONSTRUCTION Left    CATARACT EXTRACTION     MASTECTOMY Left    ROTATOR CUFF REPAIR     SPINAL FUSION      x 2   TOTAL ABDOMINAL HYSTERECTOMY W/ BILATERAL SALPINGOOPHORECTOMY     TUBAL LIGATION       OB History  No obstetric history on file.     Family History  Problem Relation Age of Onset   Depression Other        Parent   Arthritis Other        Parent, Grandparent   Hypertension Other        Grandparent   Hyperlipidemia Other        [place]   Miscarriages / Stillbirths Other        Grandparent   Stroke Other        [place]   Cancer Maternal Uncle        prostate   Hypertension Maternal Grandfather    Breast cancer Cousin    Breast cancer Cousin     Social History   Tobacco Use   Smoking status: Never   Smokeless tobacco: Never  Vaping Use   Vaping Use: Never used  Substance  Use Topics   Alcohol use: No    Alcohol/week: 0.0 standard drinks   Drug use: No    Home Medications Prior to Admission medications   Medication Sig Start Date End Date Taking? Authorizing Provider  amLODipine (NORVASC) 10 MG tablet TAKE 1 TABLET BY MOUTH EVERY DAY 05/17/20  Yes [name], [name], MD  BIOTIN PO Take 1 tablet by mouth 2 (two) times daily. [place]   Yes [provider]  DULoxetine (CYMBALTA) 30 MG capsule Take 3 capsules (90 mg total) by mouth daily. 08/12/20  Yes [name], Billey Co., MD  fenofibrate (TRICOR) 48 MG tablet Take 48 mg by mouth daily.   Yes [provider]  furosemide (LASIX) 20 MG tablet TAKE 1 TABLET BY MOUTH AS NEEDED Patient taking differently: Take 20 mg by mouth daily. Only takes as needed 08/27/19  Yes [name], Reynold Bowen, MD  HYDROcodone-acetaminophen (NORCO/VICODIN) 5-325 MG tablet Take 1 tablet by mouth every 6 (six) hours as needed. 09/12/20  Yes Rayna Sexton, PA-C  labetalol (NORMODYNE) 100 MG tablet Take 1 tablet (100 mg total) by mouth 2 (two) times daily. 09/13/20  Yes [name], [name], MD  levothyroxine (SYNTHROID) 25 MCG tablet TAKE 1 TABLET BY MOUTH EVERY DAY BEFORE BREAKFAST Patient taking differently: Take 25 mcg by mouth daily before breakfast. 04/28/20  Yes [name], MD  Magnesium 500 MG TABS Take 1 tablet by mouth daily.   Yes [provider]  mirtazapine (REMERON) 7.5 MG tablet Take 1 tablet (7.5 mg total) by mouth at bedtime. 08/12/20  Yes [name], Billey Co., MD  nitroGLYCERIN (NITROSTAT) 0.4 MG SL tablet Place 1 tablet (0.4 mg total) under the tongue every 5 (five) minutes as needed for chest pain. 10/31/18 03/03/21 Yes [name], [name], MD  predniSONE (DELTASONE) 10 MG tablet Take 10 mg by mouth daily with breakfast.   Yes [provider]  RESTASIS MULTIDOSE 0.05 % ophthalmic emulsion Place 1 drop into both eyes 2 (two) times daily.  06/21/16  Yes [provider]   rosuvastatin (CRESTOR) 20 MG tablet Take 20 mg by mouth daily.   Yes [provider]  valsartan (DIOVAN) 320 MG tablet Take 1 tablet (320 mg total) by mouth daily. 09/13/20  Yes [name], Reynold Bowen, MD  warfarin (COUMADIN) 5 MG tablet 48m on Sunday, 2.537mAll other days Patient taking differently: Take 5 mg by mouth See admin instructions. Take 1 tablet on Sunday, Monday and Tuesday and all other days 2.5 tablet daily 02/06/20  Yes TaMidge MiniumMD  Accu-Chek Softclix Lancets lancets  USE AS INSTRUCTED TO TEST SUGARS TWICE A DAY 08/18/20   [name], MD  Blood Glucose Monitoring Suppl (ACCU-CHEK GUIDE) w/Device KIT 1 each by Does not apply route 2 (two) times daily. To test sugars. Dx. E11.9 06/23/19   [name], MD  Colchicine (MITIGARE) 0.6 MG CAPS Take 1 capsule by mouth 2 (two) times daily as needed. Patient not taking: No sig reported 09/16/20   [name], Michael, MD  glucose blood (ACCU-CHEK GUIDE) test strip CHECK BLOOD SUGARS DAILY 08/20/19   [name], MD  metFORMIN (GLUCOPHAGE-XR) 500 MG 24 hr tablet Take 500 mg by mouth daily with breakfast. Patient not taking: Reported on 09/16/2020 03/23/16   [provider]  Vitamin D, Ergocalciferol, (DRISDOL) 1.25 MG (50000 UNIT) CAPS capsule TAKE 1 CAPSULE BY MOUTH ONE TIME PER WEEK Patient not taking: No sig reported 06/17/20   Purnell Shoemaker., MD    Allergies    Anaprox [naproxen sodium], Lipitor [atorvastatin], Morphine, Meperidine, and Naproxen sodium  Review of Systems   Review of Systems  Constitutional:  Negative for chills and fever.  HENT:  Positive for trouble swallowing. Negative for dental problem and sore throat.   Respiratory:  Positive for chest tightness and shortness of breath. Negative for choking (Not airway, more of difficulty swallowing.).   Gastrointestinal:  Negative for abdominal pain.  Musculoskeletal:  Positive for back pain.  Neurological:  Negative for headaches.   Psychiatric/Behavioral:  Negative for confusion.   All other systems reviewed and are negative.  Physical Exam Updated Vital Signs BP 123/60   Pulse (!) 51   Temp 97.7 F (36.5 C) (Oral)   Resp 18   SpO2 98%   Physical Exam Vitals and nursing note reviewed.  Constitutional:      General: She is not in acute distress.    Appearance: She is not diaphoretic.  HENT:     Head: Normocephalic and atraumatic.  Eyes:     General: No scleral icterus.       Right eye: No discharge.        Left eye: No discharge.     Conjunctiva/sclera: Conjunctivae normal.  Cardiovascular:     Rate and Rhythm: Regular rhythm. Bradycardia present.     Pulses: Normal pulses.     Heart sounds: Normal heart sounds.  Pulmonary:     Effort: Pulmonary effort is normal. No respiratory distress.     Breath sounds: Normal breath sounds. No stridor.  Abdominal:     General: There is no distension.     Tenderness: There is abdominal tenderness. There is no guarding.  Musculoskeletal:        General: No deformity.     Cervical back: Normal range of motion and neck supple.     Right lower leg: No edema.     Left lower leg: No edema.  Skin:    General: Skin is warm and dry.  Neurological:     General: No focal deficit present.     Mental Status: She is alert and oriented to person, place, and time.     Motor: No abnormal muscle tone.  Psychiatric:        Mood and Affect: Mood normal.        Behavior: Behavior normal.    ED Results / Procedures / Treatments   Labs (all labs ordered are listed, but only abnormal results are displayed) Labs Reviewed  COMPREHENSIVE METABOLIC PANEL - Abnormal; Notable for the following components:  Result Value   CO2 21 (*)    Glucose, Bld 218 (*)    BUN 71 (*)    Creatinine, Ser 1.72 (*)    AST 52 (*)    ALT 48 (*)    GFR, Estimated 30 (*)    All other components within normal limits  LIPASE, BLOOD - Abnormal; Notable for the following components:   Lipase 62  (*)    All other components within normal limits  CBC WITH DIFFERENTIAL/PLATELET - Abnormal; Notable for the following components:   RBC 3.46 (*)    Hemoglobin 10.9 (*)    HCT 32.8 (*)    Lymphs Abs 0.6 (*)    All other components within normal limits  PROTIME-INR - Abnormal; Notable for the following components:   Prothrombin Time 25.3 (*)    INR 2.3 (*)    All other components within normal limits  RESP PANEL BY RT-PCR (FLU A&B, COVID) ARPGX2  D-DIMER, QUANTITATIVE  COMPREHENSIVE METABOLIC PANEL  CBC  PROTIME-INR  APTT  MAGNESIUM  PHOSPHORUS  HEMOGLOBIN A1C  TROPONIN I (HIGH SENSITIVITY)  TROPONIN I (HIGH SENSITIVITY)    EKG None  Radiology CT ABDOMEN PELVIS WO CONTRAST  Result Date: 09/16/2020 CLINICAL DATA:  Nonlocalized abdominal pain emesis after eating EXAM: CT ABDOMEN AND PELVIS WITHOUT CONTRAST TECHNIQUE: Multidetector CT imaging of the abdomen and pelvis was performed following the standard protocol without IV contrast. COMPARISON:  CT 08/22/2010 FINDINGS: Lower chest: Lung bases demonstrate no acute consolidation or effusion. Cardiomediastinal silhouette within normal limits Hepatobiliary: No focal liver abnormality is seen. No gallstones, gallbladder wall thickening, or biliary dilatation. Pancreas: Unremarkable. No pancreatic ductal dilatation or surrounding inflammatory changes. Spleen: Peripherally calcified cyst in the posterior spleen. Adrenals/Urinary Tract: Adrenal glands are unremarkable. Kidneys are normal, without renal calculi, focal lesion, or hydronephrosis. Bladder is unremarkable. Stomach/Bowel: Stomach is within normal limits. History of appendectomy. No evidence of bowel wall thickening, distention, or inflammatory changes. Scattered colon diverticula without acute wall thickening. Vascular/Lymphatic: Moderate aortic atherosclerosis. No aneurysm. No suspicious nodes Reproductive: Status post hysterectomy. No adnexal masses. Other: Negative for free air or  free fluid. Small metallic densities within the anterior abdominal wall. Musculoskeletal: No acute or significant osseous findings. IMPRESSION: No CT evidence for acute intra-abdominal or pelvic abnormality Electronically Signed   By: Donavan Foil M.D.   On: 09/16/2020 20:47   DG Chest 2 View  Result Date: 09/16/2020 CLINICAL DATA:  Shortness of breath. EXAM: CHEST - 2 VIEW COMPARISON:  Chest x-ray dated October 17, 2018. FINDINGS: The heart size and mediastinal contours are within normal limits. Both lungs are clear. The visualized skeletal structures are unremarkable. IMPRESSION: No active cardiopulmonary disease. Electronically Signed   By: Titus Dubin M.D.   On: 09/16/2020 16:44    Procedures Procedures   Medications Ordered in ED Medications  insulin aspart (novoLOG) injection 0-9 Units (0 Units Subcutaneous Not Given 09/17/20 0002)  sodium chloride 0.9 % bolus 250 mL (0 mLs Intravenous Stopped 09/16/20 1757)  0.9 %  sodium chloride infusion ( Intravenous Infusion Verify 09/16/20 2255)  pantoprazole (PROTONIX) injection 40 mg (40 mg Intravenous Given 09/16/20 2138)    ED Course  I have reviewed the triage vital signs and the nursing notes.  Pertinent labs & imaging results that were available during my care of the patient were reviewed by me and considered in my medical decision making (see chart for details).  Clinical Course as of 09/17/20 0003  Thu Sep 16, 2020  2146  I spoke with Dr. Gala Romney who requested anticoagulants be held tonight, ok for sips of clears until midnight and NPO after incase wish to scope in AM.  I spoke with Hospitalist who will admit.  [EH]    Clinical Course User Index [EH] Ollen Gross   MDM Rules/Calculators/A&P                          Patient is a 80 year old woman who presents today for evaluation of abnormal feelings in her chest.  These are primarily associated with eating however sometimes or even with drinking. Based on her history  originally had concern for referred cardiopulmonary source of her symptoms, especially as she also reported shortness of breath over this time.    X2 is not elevated, EKG is without ischemia.  D-dimer is obtained given her history of PE/DVT and that is not elevated.  COVID and flu test is negative, she is anticoagulated with warfarin at baseline, INR is 2.3.  Her lipase is slightly elevated at 62.  Hemoglobin is slightly anemic at 10.9.  CMP shows her Cr is slightly elevated at 1.72 , prior labs 7 months ago show Cr of 0.93.   Given that she has AKI with difficulty swallowing she will require admission for dehydration. I spoke with Dr. Gala Romney of GI who requested that her anticoagulants be held tonight, is okay of sips of clears until midnight with n.p.o. after midnight in case wish to scope in the morning.  Her chart does show she has a history of a esophageal stricture.    I spoke with hospitalist who will see patient for admission. Patient is started on IV Protonix while in the ER and given gentle IV hydration.  The patient appears reasonably stabilized for admission considering the current resources, flow, and capabilities available in the ED at this time, and I doubt any other [facility] requiring further screening and/or treatment in the ED prior to admission assuming timely admission and bed placement.  Note: Portions of this report may have been transcribed using voice recognition software. Every effort was made to ensure accuracy; however, inadvertent computerized transcription errors may be present   Final Clinical Impression(s) / ED Diagnoses Final diagnoses:  Dysphagia, unspecified type  AKI (acute kidney injury) [facility])  Dehydration    Rx / DC Orders ED Discharge Orders     None        Lorin Glass, PA-C 09/17/20 0011    Milton Ferguson, MD 09/17/20 1040

## 2020-09-16 NOTE — ED Triage Notes (Signed)
States she feels like she needs to vomit after each meal and when she  does her pain is relieved

## 2020-09-16 NOTE — ED Notes (Signed)
Pt tolerating PO fluids at this time.

## 2020-09-16 NOTE — Progress Notes (Signed)
Office Visit Note   Patient: Andrea Simmons           Date of Birth: 1940/09/16           MRN: [id number] Visit Date: 09/16/2020 Requested by: [name], MD [address] [place],  [place] [address] PCP: [name], MD  Subjective: Chief Complaint  Patient presents with   Right Hand - Pain    Pain and swelling in the right hand around the thumb and across the hand. She saw her rheumatologist in May and was given a prednisone taper for her hand -- this did help her. But the pain and swelling returned. Went to ED 09/12/20 and was given pain medication and prednisone. Right-hand dominant.    Left Hand - Pain    Pain in the left hand, but not as severely as on the right.     HPI: She is here with right wrist pain.  She has a history of osteoarthritis, and has been followed by rheumatology for many years.  This year she has had several flareups of wrist pain requiring oral prednisone.  Recently the pain was so severe that she went to the ER.  Her wrist was swollen and they recommended coming to [place] for possible aspiration and synovial fluid analysis.  Her pain has improved since then.  No history of gout, although she says that her rheumatologist has been suspicious of that.              ROS: No fevers or chills.  All other systems were reviewed and are negative.  Objective: Vital Signs: There were no vitals taken for this visit.  Physical Exam:  General:  Alert and oriented, in no acute distress. Pulm:  Breathing unlabored. Psy:  Normal mood, congruent affect. Skin: No erythema Right wrist: No significant synovitis this morning.  No warmth.  She has mild tenderness across the dorsum of her wrist.  Fairly good range of motion.  Imaging: Limited diagnostic ultrasound of the right wrist reveals no significant joint effusion, no hyperemia on power Doppler imaging.    Assessment & Plan: Resolving right wrist pain, etiology uncertain.  Could be inflammatory  arthritis such as gout. -We will try colchicine.  If she develops another effusion, she will come back in for attempted aspiration.     Procedures: No procedures performed        PMFS History: Patient Active Problem List   Diagnosis Date Noted   Vitamin D deficiency 10/01/2019   Polypharmacy 10/01/2019   Major depressive disorder, recurrent episode, moderate (HCC) 10/01/2019   Leg edema 06/04/2019   Chest pain of uncertain etiology 19/41/7408   Hx of pulmonary embolus 03/30/2017   Physical exam 11/30/2016   Hyperlipidemia 12/26/2013   Encounter for therapeutic drug monitoring 04/16/2013   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   Long term (current) use of anticoagulants 06/30/2010   Exertional dyspnea 11/09/2008   Diabetes type 2, controlled ([place]) 08/27/2008   ANEMIA-NOS 08/27/2008   Seasonal and perennial allergic rhinitis 08/27/2008   PULMONARY NODULE 08/27/2008   Fibromyalgia 08/27/2008   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 ([place]) 03/20/2007   Hypothyroidism 03/20/2007   Essential hypertension 03/20/2007   Recurrent pulmonary emboli ([place]) 03/20/2007   GERD 03/20/2007   ESOPHAGEAL STRICTURE 03/08/2007   Past Medical History:  Diagnosis Date   Anemia    Anxiety    Breast cancer ([place])    Depression  Diabetes mellitus type II    Fibromyalgia    GERD (gastroesophageal reflux disease)    Hyperlipidemia    Hypertension    Malignant neoplasm of breast (female), unspecified site 1993   L breast s/p mastectomy and tamoxifen x 8yrs   Other pulmonary embolism and infarction 2008 and 2009   chronic anticoag - LeB CC   Unspecified hypothyroidism     Family History  Problem Relation Age of Onset   Depression Other        Parent   Arthritis Other        Parent, Grandparent   Hypertension Other        Grandparent   Hyperlipidemia Other        [place]   Miscarriages / Stillbirths Other        Grandparent   Stroke Other        [facility]    Cancer Maternal Uncle        prostate   Hypertension Maternal Grandfather    Breast cancer Cousin    Breast cancer Cousin     Past Surgical History:  Procedure Laterality Date   APPENDECTOMY     BREAST BIOPSY Left 1993   BREAST BIOPSY Right 09/25/2014   stero. Benign   BREAST RECONSTRUCTION Left    CATARACT EXTRACTION     MASTECTOMY Left    ROTATOR CUFF REPAIR     SPINAL FUSION      x 2   TOTAL ABDOMINAL HYSTERECTOMY W/ BILATERAL SALPINGOOPHORECTOMY     TUBAL LIGATION     Social History   Occupational History   Occupation: Armed forces technical officer: RETIRED   Occupation: Emergency planning/management officer   Occupation: school bus driver  Tobacco Use   Smoking status: Never   Smokeless tobacco: Never  Scientific laboratory technician Use: Never used  Substance and Sexual Activity   Alcohol use: No    Alcohol/week: 0.0 standard drinks   Drug use: No   Sexual activity: Not on file

## 2020-09-16 NOTE — H&P (Signed)
History and Physical  FRANCE LUSTY QMG:[id number] DOB: 25-Jan-1941 DOA: 09/16/2020  Referring physician: Lorin Glass, PA-C  PCP: [name], MD  Patient coming from: Home  Chief Complaint: Swallowing difficulty  HPI: Andrea Simmons is a 80 y.o. female with medical history significant for hypertension, PE on warfarin, depression, anxiety who presents to the emergency department due to 2-week onset of difficulty swallowing.  Patient complained of intermittent sensation of not being able to swallow food (this applies to solid and liquid), oftentimes, she vomits consumed food with consequent improvement in throat and esophageal discomfort.  Patient states that she was once diagnosed to have GERD and was placed on Prilosec, but after a while, her PCP stopped the medication and was told that she did not need it anymore.  No known alleviating/aggravating factors none.  She denies fever, chills, headache, blurry vision or recent weight loss.  ED Course:  In the emergency department, she was bradycardic, but other vital signs were within normal range.  Work-up in the ED showed normocytic anemia, BUN/creatinine 71/1.72 (baseline creatinine at 0.9-1.0), elevated transaminitis, hyperglycemia.  Influenza A, B, SARS coronavirus 2 was negative. CT abdomen pelvis without contrast showed no CT evidence for acute intra-abdominal or pelvic abnormality Chest x-ray showed no active cardiopulmonary disease She was treated with Protonix and IV hydration was provided.  Gastroenterologist was consulted and will see patient in the morning per ED PA.  Hospitalist was asked to admit.  For further evaluation and management.  Review of Systems: Constitutional: Negative for chills and fever.  HENT: Negative for ear pain and sore throat.   Eyes: Negative for pain and visual disturbance.  Respiratory: Negative for cough, chest tightness and shortness of breath.   Cardiovascular: Negative for chest pain and  palpitations.  Gastrointestinal: Positive for swallowing difficulty.  Negative for abdominal pain and vomiting.  Endocrine: Negative for polyphagia and polyuria.  Genitourinary: Negative for decreased urine volume, dysuria, enuresis Musculoskeletal: Negative for arthralgias and back pain.  Skin: Negative for color change and rash.  Allergic/Immunologic: Negative for immunocompromised state.  Neurological: Negative for tremors, syncope, speech difficulty Hematological: Does not bruise/bleed easily.  All other systems reviewed and are negative  Review of systems as noted in the HPI. All other systems reviewed and are negative.   Past Medical History:  Diagnosis Date   Anemia    Anxiety    Breast cancer ([place])    Depression    Diabetes mellitus type II    Fibromyalgia    GERD (gastroesophageal reflux disease)    Hyperlipidemia    Hypertension    Malignant neoplasm of breast (female), unspecified site 1993   L breast s/p mastectomy and tamoxifen x 71yr   Other pulmonary embolism and infarction 2008 and 2009   chronic anticoag - [facility]   Unspecified hypothyroidism    Past Surgical History:  Procedure Laterality Date   APPENDECTOMY     BREAST BIOPSY Left 1993   BREAST BIOPSY Right 09/25/2014   stero. Benign   BREAST RECONSTRUCTION Left    CATARACT EXTRACTION     MASTECTOMY Left    ROTATOR CUFF REPAIR     SPINAL FUSION      x 2   TOTAL ABDOMINAL HYSTERECTOMY W/ BILATERAL SALPINGOOPHORECTOMY     TUBAL LIGATION      Social History:  reports that she has never smoked. She has never used smokeless tobacco. She reports that she does not drink alcohol and does not use drugs.  Allergies  Allergen Reactions   Anaprox [Naproxen Sodium]    Lipitor [Atorvastatin]    Morphine    Meperidine Nausea And Vomiting   Naproxen Sodium Nausea And Vomiting    Family History  Problem Relation Age of Onset   Depression Other        Parent   Arthritis Other        Parent, Grandparent    Hypertension Other        Grandparent   Hyperlipidemia Other        [place]   Miscarriages / Stillbirths Other        Grandparent   Stroke Other        [place]   Cancer Maternal Uncle        prostate   Hypertension Maternal Grandfather    Breast cancer Cousin    Breast cancer Cousin      Prior to Admission medications   Medication Sig Start Date End Date Taking? Authorizing Provider  amLODipine (NORVASC) 10 MG tablet TAKE 1 TABLET BY MOUTH EVERY DAY 05/17/20  Yes [name], [name], MD  BIOTIN PO Take 1 tablet by mouth 2 (two) times daily. [place]   Yes [provider]  DULoxetine (CYMBALTA) 30 MG capsule Take 3 capsules (90 mg total) by mouth daily. 08/12/20  Yes [name], Billey Co., MD  fenofibrate (TRICOR) 48 MG tablet Take 48 mg by mouth daily.   Yes [provider]  furosemide (LASIX) 20 MG tablet TAKE 1 TABLET BY MOUTH AS NEEDED Patient taking differently: Take 20 mg by mouth daily. Only takes as needed 08/27/19  Yes [name], Reynold Bowen, MD  HYDROcodone-acetaminophen (NORCO/VICODIN) 5-325 MG tablet Take 1 tablet by mouth every 6 (six) hours as needed. 09/12/20  Yes Rayna Sexton, PA-C  labetalol (NORMODYNE) 100 MG tablet Take 1 tablet (100 mg total) by mouth 2 (two) times daily. 09/13/20  Yes [name], [name], MD  levothyroxine (SYNTHROID) 25 MCG tablet TAKE 1 TABLET BY MOUTH EVERY DAY BEFORE BREAKFAST Patient taking differently: Take 25 mcg by mouth daily before breakfast. 04/28/20  Yes [name], MD  Magnesium 500 MG TABS Take 1 tablet by mouth daily.   Yes [provider]  mirtazapine (REMERON) 7.5 MG tablet Take 1 tablet (7.5 mg total) by mouth at bedtime. 08/12/20  Yes [name], Billey Co., MD  nitroGLYCERIN (NITROSTAT) 0.4 MG SL tablet Place 1 tablet (0.4 mg total) under the tongue every 5 (five) minutes as needed for chest pain. 10/31/18 03/03/21 Yes [name], [name], MD  predniSONE (DELTASONE) 10 MG tablet Take 10 mg by mouth  daily with breakfast.   Yes [provider]  RESTASIS MULTIDOSE 0.05 % ophthalmic emulsion Place 1 drop into both eyes 2 (two) times daily.  06/21/16  Yes [provider]  rosuvastatin (CRESTOR) 20 MG tablet Take 20 mg by mouth daily.   Yes [provider]  valsartan (DIOVAN) 320 MG tablet Take 1 tablet (320 mg total) by mouth daily. 09/13/20  Yes [name], Reynold Bowen, MD  warfarin (COUMADIN) 5 MG tablet 20m on Sunday, 2.583mAll other days Patient taking differently: Take 5 mg by mouth See admin instructions. Take 1 tablet on Sunday, Monday and Tuesday and all other days 2.5 tablet daily 02/06/20  Yes TaMidge MiniumMD  Accu-Chek Softclix Lancets lancets USE AS INSTRUCTED TO TEST SUGARS TWICE A DAY 08/18/20   TaMidge MiniumMD  Blood Glucose Monitoring Suppl (ACCU-CHEK GUIDE) w/Device  KIT 1 each by Does not apply route 2 (two) times daily. To test sugars. Dx. E11.9 06/23/19   [name], MD  Colchicine (MITIGARE) 0.6 MG CAPS Take 1 capsule by mouth 2 (two) times daily as needed. Patient not taking: No sig reported 09/16/20   [name], Michael, MD  glucose blood (ACCU-CHEK GUIDE) test strip CHECK BLOOD SUGARS DAILY 08/20/19   [name], MD  metFORMIN (GLUCOPHAGE-XR) 500 MG 24 hr tablet Take 500 mg by mouth daily with breakfast. Patient not taking: Reported on 09/16/2020 03/23/16   [provider]  Vitamin D, Ergocalciferol, (DRISDOL) 1.25 MG (50000 UNIT) CAPS capsule TAKE 1 CAPSULE BY MOUTH ONE TIME PER WEEK Patient not taking: No sig reported 06/17/20   [name], Billey Co., MD    Physical Exam: BP 123/60   Pulse (!) 51   Temp 97.7 F (36.5 C) (Oral)   Resp 18   SpO2 98%   General: 80 y.o. year-old female well developed well nourished in no acute distress.  Alert and oriented x3. HEENT: NCAT, EOMI Neck: Supple, trachea medial Cardiovascular: Regular rate and rhythm with no rubs or gallops.  No thyromegaly or JVD noted.  No lower extremity  edema. 2/4 pulses in all 4 extremities. Respiratory: Clear to auscultation with no wheezes or rales. Good inspiratory effort. Abdomen: Soft, nontender nondistended with normal bowel sounds x4 quadrants. Muskuloskeletal: No cyanosis, clubbing or edema noted bilaterally Neuro: CN II-XII intact, strength 5/5 x 4, sensation, reflexes intact Skin: No ulcerative lesions noted or rashes Psychiatry: Judgement and insight appear normal. Mood is appropriate for condition and setting          Labs on Admission:  Basic Metabolic Panel: Recent Labs  Lab 09/16/20 1552  NA 136  K 5.0  CL 106  CO2 21*  GLUCOSE 218*  BUN 71*  CREATININE 1.72*  CALCIUM 9.9   Liver Function Tests: Recent Labs  Lab 09/16/20 1552  AST 52*  ALT 48*  ALKPHOS 43  BILITOT 0.5  PROT 7.0  ALBUMIN 3.7   Recent Labs  Lab 09/16/20 1552  LIPASE 62*   No results for input(s): AMMONIA in the last 168 hours. CBC: Recent Labs  Lab 09/16/20 1552  WBC 6.2  NEUTROABS 5.4  HGB 10.9*  HCT 32.8*  MCV 94.8  PLT 238   Cardiac Enzymes: No results for input(s): CKTOTAL, CKMB, CKMBINDEX, TROPONINI in the last 168 hours.  BNP (last 3 results) No results for input(s): BNP in the last 8760 hours.  ProBNP (last 3 results) No results for input(s): PROBNP in the last 8760 hours.  CBG: No results for input(s): GLUCAP in the last 168 hours.  Radiological Exams on Admission: CT ABDOMEN PELVIS WO CONTRAST  Result Date: 09/16/2020 CLINICAL DATA:  Nonlocalized abdominal pain emesis after eating EXAM: CT ABDOMEN AND PELVIS WITHOUT CONTRAST TECHNIQUE: Multidetector CT imaging of the abdomen and pelvis was performed following the standard protocol without IV contrast. COMPARISON:  CT 08/22/2010 FINDINGS: Lower chest: Lung bases demonstrate no acute consolidation or effusion. Cardiomediastinal silhouette within normal limits Hepatobiliary: No focal liver abnormality is seen. No gallstones, gallbladder wall thickening, or  biliary dilatation. Pancreas: Unremarkable. No pancreatic ductal dilatation or surrounding inflammatory changes. Spleen: Peripherally calcified cyst in the posterior spleen. Adrenals/Urinary Tract: Adrenal glands are unremarkable. Kidneys are normal, without renal calculi, focal lesion, or hydronephrosis. Bladder is unremarkable. Stomach/Bowel: Stomach is within normal limits. History of appendectomy. No evidence of bowel wall thickening, distention, or inflammatory changes. Scattered  colon diverticula without acute wall thickening. Vascular/Lymphatic: Moderate aortic atherosclerosis. No aneurysm. No suspicious nodes Reproductive: Status post hysterectomy. No adnexal masses. Other: Negative for free air or free fluid. Small metallic densities within the anterior abdominal wall. Musculoskeletal: No acute or significant osseous findings. IMPRESSION: No CT evidence for acute intra-abdominal or pelvic abnormality Electronically Signed   By: Donavan Foil M.D.   On: 09/16/2020 20:47   DG Chest 2 View  Result Date: 09/16/2020 CLINICAL DATA:  Shortness of breath. EXAM: CHEST - 2 VIEW COMPARISON:  Chest x-ray dated October 17, 2018. FINDINGS: The heart size and mediastinal contours are within normal limits. Both lungs are clear. The visualized skeletal structures are unremarkable. IMPRESSION: No active cardiopulmonary disease. Electronically Signed   By: Titus Dubin M.D.   On: 09/16/2020 16:44    EKG: I independently viewed the EKG done and my findings are as followed: Normal sinus rhythm at a rate of 62 bpm with RBBB and LAFB  Assessment/Plan Present on Admission:  Esophageal dysphagia  Essential hypertension  Hyperlipidemia  Hypothyroidism  Recurrent pulmonary emboli (HCC)  Principal Problem:   Esophageal dysphagia Active Problems:   Hypothyroidism   Essential hypertension   Recurrent pulmonary emboli (HCC)   Hyperlipidemia   Dehydration   AKI (acute kidney injury) ([place])   Elevated lipase    Hyperglycemia due to diabetes mellitus ([place])  Esophageal dysphagia Patient endorsed 2-week onset of trouble with swallowing which applies to solids and liquids Patient may need an EGD, gastroenterology was consulted and will see patient in the morning per ED PA and we shall await their recommendation. Continue aspiration precaution  Acute kidney injury/dehydration BUN/creatinine 71/1.72 (baseline creatinine at 0.9-1.0) Continue gentle hydration Renally adjust medications, avoid nephrotoxic agents/dehydration/hypotension  Elevated lipase level Lipase 62, CT abdomen and pelvis showed no acute abdominal abnormality Patient denies abdominal pain Patient's clinical presentation does not appear to be due to to pancreatitis  Hyperglycemia secondary to T2DM Continue ISS and hypoglycemic protocol  Hypothyroidism Continue Synthroid  Essential hypertension Continue Norvasc and labetalol  Hyperlipidemia Statin held due to transaminitis  History of recurrent pulmonary emboli Warfarin will be held at this time due to possible GI intervention in the morning INR 2.3; continue to monitor INR  Depression Continue Remeron per home regimen when patient resumes oral intake   DVT prophylaxis: SCDs  Code Status: Full code  Family Communication: None at bedside  Disposition Plan:  Patient is from:                        home Anticipated DC to:                   SNF or family members home Anticipated DC date:               2-3 days Anticipated DC barriers:          Patient requires inpatient management due to esophageal dysphagia pending possible GI intervention  Consults called: Gastroenterology  Admission status: Inpatient    Bernadette Hoit MD [facility]  09/16/2020, 11:44 PM

## 2020-09-17 DIAGNOSIS — R1319 Other dysphagia: Secondary | ICD-10-CM

## 2020-09-17 LAB — IRON AND TIBC
Iron: 48 ug/dL (ref 28–170)
Saturation Ratios: 11 % (ref 10.4–31.8)
TIBC: 437 ug/dL (ref 250–450)
UIBC: 389 ug/dL

## 2020-09-17 LAB — COMPREHENSIVE METABOLIC PANEL
ALT: 35 U/L (ref 0–44)
AST: 35 U/L (ref 15–41)
Albumin: 3.1 g/dL — ABNORMAL LOW (ref 3.5–5.0)
Alkaline Phosphatase: 33 U/L — ABNORMAL LOW (ref 38–126)
Anion gap: 6 (ref 5–15)
BUN: 52 mg/dL — ABNORMAL HIGH (ref 8–23)
CO2: 21 mmol/L — ABNORMAL LOW (ref 22–32)
Calcium: 8.9 mg/dL (ref 8.9–10.3)
Chloride: 112 mmol/L — ABNORMAL HIGH (ref 98–111)
Creatinine, Ser: 1.22 mg/dL — ABNORMAL HIGH (ref 0.44–1.00)
GFR, Estimated: 45 mL/min — ABNORMAL LOW (ref >60)
Glucose, Bld: 111 mg/dL — ABNORMAL HIGH (ref 70–99)
Potassium: 4.2 mmol/L (ref 3.5–5.1)
Sodium: 139 mmol/L (ref 135–145)
Total Bilirubin: 0.3 mg/dL (ref 0.3–1.2)
Total Protein: 5.8 g/dL — ABNORMAL LOW (ref 6.5–8.1)

## 2020-09-17 LAB — CBC
HCT: 29.8 % — ABNORMAL LOW (ref 36.0–46.0)
Hemoglobin: 9.8 g/dL — ABNORMAL LOW (ref 12.0–15.0)
MCH: 31.1 pg (ref 26.0–34.0)
MCHC: 32.9 g/dL (ref 30.0–36.0)
MCV: 94.6 fL (ref 80.0–100.0)
Platelets: 197 10*3/uL (ref 150–400)
RBC: 3.15 MIL/uL — ABNORMAL LOW (ref 3.87–5.11)
RDW: 12.7 % (ref 11.5–15.5)
WBC: 7.6 10*3/uL (ref 4.0–10.5)
nRBC: 0 % (ref 0.0–0.2)

## 2020-09-17 LAB — GLUCOSE, CAPILLARY
Glucose-Capillary: 100 mg/dL — ABNORMAL HIGH (ref 70–99)
Glucose-Capillary: 113 mg/dL — ABNORMAL HIGH (ref 70–99)
Glucose-Capillary: 130 mg/dL — ABNORMAL HIGH (ref 70–99)

## 2020-09-17 LAB — CBG MONITORING, ED: Glucose-Capillary: 98 mg/dL (ref 70–99)

## 2020-09-17 LAB — PHOSPHORUS: Phosphorus: 3 mg/dL (ref 2.5–4.6)

## 2020-09-17 LAB — APTT: aPTT: 29 seconds (ref 24–36)

## 2020-09-17 LAB — PROTIME-INR
INR: 2.4 — ABNORMAL HIGH (ref 0.8–1.2)
Prothrombin Time: 26.3 seconds — ABNORMAL HIGH (ref 11.4–15.2)

## 2020-09-17 LAB — FERRITIN: Ferritin: 23 ng/mL (ref 11–307)

## 2020-09-17 LAB — MAGNESIUM: Magnesium: 1.7 mg/dL (ref 1.7–2.4)

## 2020-09-17 MED ORDER — SODIUM CHLORIDE 0.9 % IV SOLN: INTRAVENOUS | Status: AC

## 2020-09-17 MED ORDER — PREDNISONE 20 MG PO TABS
10.0000 mg | ORAL_TABLET | Freq: Every day | ORAL | Status: DC
  Administered 2020-09-18: 10 mg via ORAL
  Filled 2020-09-17: qty 1

## 2020-09-17 MED ORDER — LEVOTHYROXINE SODIUM 25 MCG PO TABS
25.0000 ug | ORAL_TABLET | Freq: Every day | ORAL | Status: DC
  Administered 2020-09-18: 25 ug via ORAL
  Filled 2020-09-17: qty 1

## 2020-09-17 MED ORDER — HYDROCODONE-ACETAMINOPHEN 5-325 MG PO TABS
1.0000 | ORAL_TABLET | Freq: Four times a day (QID) | ORAL | Status: DC | PRN
  Administered 2020-09-18: 1 via ORAL
  Filled 2020-09-17: qty 1

## 2020-09-17 MED ORDER — IRBESARTAN 150 MG PO TABS
300.0000 mg | ORAL_TABLET | Freq: Every day | ORAL | Status: DC
  Administered 2020-09-17 – 2020-09-18 (×2): 300 mg via ORAL
  Filled 2020-09-17 (×2): qty 2

## 2020-09-17 MED ORDER — MIRTAZAPINE 15 MG PO TABS
7.5000 mg | ORAL_TABLET | Freq: Every day | ORAL | Status: DC
  Administered 2020-09-17: 7.5 mg via ORAL
  Filled 2020-09-17: qty 1

## 2020-09-17 MED ORDER — LABETALOL HCL 200 MG PO TABS
100.0000 mg | ORAL_TABLET | Freq: Two times a day (BID) | ORAL | Status: DC
  Administered 2020-09-17: 100 mg via ORAL
  Filled 2020-09-17: qty 1

## 2020-09-17 MED ORDER — PANTOPRAZOLE SODIUM 40 MG PO TBEC
40.0000 mg | DELAYED_RELEASE_TABLET | Freq: Two times a day (BID) | ORAL | Status: DC
  Administered 2020-09-17 – 2020-09-18 (×2): 40 mg via ORAL
  Filled 2020-09-17 (×2): qty 1

## 2020-09-17 MED ORDER — AMLODIPINE BESYLATE 5 MG PO TABS
10.0000 mg | ORAL_TABLET | Freq: Every day | ORAL | Status: DC
  Administered 2020-09-17 – 2020-09-18 (×2): 10 mg via ORAL
  Filled 2020-09-17 (×2): qty 2

## 2020-09-17 MED ORDER — CYCLOSPORINE 0.05 % OP EMUL
1.0000 [drp] | Freq: Two times a day (BID) | OPHTHALMIC | Status: DC
  Administered 2020-09-17 – 2020-09-18 (×3): 1 [drp] via OPHTHALMIC
  Filled 2020-09-17 (×3): qty 1

## 2020-09-17 MED ORDER — DULOXETINE HCL 30 MG PO CPEP
90.0000 mg | ORAL_CAPSULE | Freq: Every day | ORAL | Status: DC
Start: 2020-09-17 — End: 2020-09-18
  Administered 2020-09-17 – 2020-09-18 (×2): 90 mg via ORAL
  Filled 2020-09-17 (×2): qty 1

## 2020-09-17 NOTE — TOC Initial Note (Signed)
Transition of Care [facility]) - Initial/Assessment Note    Patient Details  Name: Andrea Simmons MRN: [id number] Date of Birth: 1940-06-07  Transition of [facility]) CM/SW Contact:    Boneta Lucks, RN Phone Number: 09/17/2020, 1:57 PM  Clinical Narrative:    Patient admitted with esophageal dysphagia. Patient has a high risk for readmission. TOC spoke to her husband, he is at the bedside. States patient drives and is independent with ADL's. No equipment is needed in the home. Patient will be have a procedure this admission, followed by GI. TOC to will follow for any discharge needs, none identified at this time.              Expected Discharge Plan: Home/Self Care Barriers to Discharge: Continued Medical Work up   Patient Goals and CMS Choice Patient states their goals for this hospitalization and ongoing recovery are:: to return home. CMS Medicare.gov Compare Post Acute Care list provided to:: Patient Represenative (must comment) Choice offered to / list presented to : Spouse  Expected Discharge Plan and Services Expected Discharge Plan: Home/Self Care       Living arrangements for the past 2 months: Single Family Home                                      Prior Living Arrangements/Services Living arrangements for the past 2 months: Single Family Home Lives with:: Spouse Patient language and need for interpreter reviewed:: Yes Do you feel safe going back to the place where you live?: Yes      Need for Family Participation in Patient Care: Yes (Comment) Care giver support system in place?: Yes (comment)   Criminal Activity/Legal Involvement Pertinent to Current Situation/Hospitalization: No - Comment as needed  Activities of Daily Living Home Assistive Devices/Equipment: Eyeglasses, CBG Meter ADL Screening (condition at time of admission) Patient's cognitive ability adequate to safely complete daily activities?: Yes Is the patient deaf or have difficulty hearing?:  No Does the patient have difficulty seeing, even when wearing glasses/contacts?: No Does the patient have difficulty concentrating, remembering, or making decisions?: No Patient able to express need for assistance with ADLs?: Yes Does the patient have difficulty dressing or bathing?: No Independently performs ADLs?: Yes (appropriate for developmental age) Does the patient have difficulty walking or climbing stairs?: No Weakness of Legs: None Weakness of Arms/Hands: None  Permission Sought/Granted            Permission granted to share info w Relationship: Husband     Emotional Assessment       Orientation: : Oriented to Self, Oriented to Place, Oriented to  Time, Oriented to Situation Alcohol / Substance Use: Not Applicable Psych Involvement: No (comment)  Admission diagnosis:  Dehydration [E86.0] Esophageal dysphagia [R13.19] AKI (acute kidney injury) ([place]) [N17.9] Dysphagia, unspecified type [R13.10] Patient Active Problem List   Diagnosis Date Noted   Esophageal dysphagia 09/16/2020   Dehydration 09/16/2020   AKI (acute kidney injury) ([place]) 09/16/2020   Elevated lipase 09/16/2020   Hyperglycemia due to diabetes mellitus ([place]) 09/16/2020   Vitamin D deficiency 10/01/2019   Polypharmacy 10/01/2019   Major depressive disorder, recurrent episode, moderate ([place]) 10/01/2019   Leg edema 06/04/2019   Chest pain of uncertain etiology 37/62/8315   Hx of pulmonary embolus 03/30/2017   Physical exam 11/30/2016   Hyperlipidemia 12/26/2013   Encounter for therapeutic drug monitoring 04/16/2013   Right bundle branch block and  left anterior fascicular block 11/27/2011   Long term (current) use of anticoagulants 06/30/2010   Exertional dyspnea 11/09/2008   Diabetes type 2, controlled ([place]) 08/27/2008   ANEMIA-NOS 08/27/2008   Seasonal and perennial allergic rhinitis 08/27/2008   PULMONARY NODULE 08/27/2008   Fibromyalgia 08/27/2008   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 ([place])  03/20/2007   Hypothyroidism 03/20/2007   Essential hypertension 03/20/2007   Recurrent pulmonary emboli ([place]) 03/20/2007   GERD 03/20/2007   ESOPHAGEAL STRICTURE 03/08/2007   PCP:  [name], MD Pharmacy:   [id number] - [place], [place] - [address] [place] [address] Phone: [phone] Fax: [phone]     Social Determinants of Health (SDOH) Interventions    Readmission Risk Interventions Readmission Risk Prevention Plan 09/17/2020  Transportation Screening Complete  HRI or Home Care Consult Complete  Social Work Consult for [place] Planning/Counseling Complete  Palliative Care Screening Not Applicable  Medication Review Press photographer) Complete  Some recent data might be hidden

## 2020-09-17 NOTE — ED Notes (Signed)
Pt called out stating her bed needs adjusting; pt adjusted and given pillow for under her knees

## 2020-09-17 NOTE — ED Notes (Signed)
Pt up to bathroom and back to bed; pt given blankets and soap upon request

## 2020-09-17 NOTE — Consult Note (Signed)
Referring Provider: Rodena Goldmann, DO Primary Care Physician:  [name], MD Primary Gastroenterologist:  remotely saw Dr. Henrene Pastor  Reason for Consultation:  esophageal dysphagia  HPI: Andrea Simmons is a 80 y.o. female with past medical history significant for hypertension, PE on warfarin, depression, anxiety, history of breast cancer, diabetes presenting to the emergency department with several week history of dysphagia.  Patient states over the past several weeks she has had progressive dysphagia to solids and liquids.  Often feels like food is stuck in her chest, becomes uncomfortable and feels like she cannot breathe when it happens.  Over the past 1 week symptoms have gotten significantly worse and has developed vomiting postprandially.  Denies a lot of heartburn type symptoms. No abdominal pain. Struggles with constipation. Takes stools softeners or occasional dulcolax as needed. No melena, brbpr.  Patient states she used to be on omeprazole years ago but has not been on it lately.  She has had a lot of issues with arthritic pain and has used prednisone a couple different times in the past 6 months.  Denies any weight loss.  Avoids NSAIDs.  On Coumadin for history of pulmonary embolus, patient states she developed her first PE years ago and was placed on Coumadin.  When she came off Coumadin for her EGD and colonoscopy in December 2008, she developed another blood clot within 10 days off of therapy.  She has been on Coumadin since that time.  In the ED: Glucose 218, BUN 71, creatinine 1.72 (normal November 2021), AST 52, ALT 48, white blood cell count 6200, hemoglobin 10.9 (normal hemoglobin November 2021), hematocrit 32.8, INR 2.3, SARS coronavirus 2 negative, influenza A+B negative, lipase 62.  D-dimer 0.39.  CT abdomen pelvis without contrast yesterday essentially unremarkable.  Chest x-ray yesterday with no active cardiopulmonary disease.   Today: Hemoglobin down to 9.8, AST  and ALT normal, BUN down to 52, creatinine down to 1.22.  INR 2.4.  EGD/colonoscopy December 2008: Esophageal stricture status post dilation, gastritis (no H. pylori on path), normal colonoscopy.  Prior to Admission medications   Medication Sig Start Date End Date Taking? Authorizing Provider  amLODipine (NORVASC) 10 MG tablet TAKE 1 TABLET BY MOUTH EVERY DAY 05/17/20  Yes [name], [name], MD  BIOTIN PO Take 1 tablet by mouth 2 (two) times daily. [place]   Yes [provider]  DULoxetine (CYMBALTA) 30 MG capsule Take 3 capsules (90 mg total) by mouth daily. 08/12/20  Yes [name], Billey Co., MD  fenofibrate (TRICOR) 48 MG tablet Take 48 mg by mouth daily.   Yes [provider]  furosemide (LASIX) 20 MG tablet TAKE 1 TABLET BY MOUTH AS NEEDED Patient taking differently: Take 20 mg by mouth daily. Only takes as needed 08/27/19  Yes [name], Reynold Bowen, MD  HYDROcodone-acetaminophen (NORCO/VICODIN) 5-325 MG tablet Take 1 tablet by mouth every 6 (six) hours as needed. 09/12/20  Yes Rayna Sexton, PA-C  labetalol (NORMODYNE) 100 MG tablet Take 1 tablet (100 mg total) by mouth 2 (two) times daily. 09/13/20  Yes [name], [name], MD  levothyroxine (SYNTHROID) 25 MCG tablet TAKE 1 TABLET BY MOUTH EVERY DAY BEFORE BREAKFAST Patient taking differently: Take 25 mcg by mouth daily before breakfast. 04/28/20  Yes [name], MD  Magnesium 500 MG TABS Take 1 tablet by mouth daily.   Yes [provider]  mirtazapine (REMERON) 7.5 MG tablet Take 1 tablet (7.5 mg total) by mouth at bedtime.  08/12/20  Yes [name], Billey Co., MD  nitroGLYCERIN (NITROSTAT) 0.4 MG SL tablet Place 1 tablet (0.4 mg total) under the tongue every 5 (five) minutes as needed for chest pain. 10/31/18 03/03/21 Yes [name], [name], MD  predniSONE (DELTASONE) 10 MG tablet Take 10 mg by mouth daily with breakfast.   Yes [provider]  RESTASIS MULTIDOSE 0.05 % ophthalmic  emulsion Place 1 drop into both eyes 2 (two) times daily.  06/21/16  Yes [provider]  rosuvastatin (CRESTOR) 20 MG tablet Take 20 mg by mouth daily.   Yes [provider]  valsartan (DIOVAN) 320 MG tablet Take 1 tablet (320 mg total) by mouth daily. 09/13/20  Yes [name], Reynold Bowen, MD  warfarin (COUMADIN) 5 MG tablet 68m on Sunday, 2.582mAll other days Patient taking differently: Take 5 mg by mouth See admin instructions. Take 1 tablet on Sunday, Monday and Tuesday and all other days 2.5 tablet daily 02/06/20  Yes TaMidge MiniumMD  Accu-Chek Softclix Lancets lancets USE AS INSTRUCTED TO TEST SUGARS TWICE A DAY 08/18/20   TaMidge MiniumMD  Blood Glucose Monitoring Suppl (ACCU-CHEK GUIDE) w/Device KIT 1 each by Does not apply route 2 (two) times daily. To test sugars. Dx. E11.9 06/23/19   TaMidge MiniumMD  Colchicine (MITIGARE) 0.6 MG CAPS Take 1 capsule by mouth 2 (two) times daily as needed. Patient not taking: No sig reported 09/16/20   [name], Michael, MD  glucose blood (ACCU-CHEK GUIDE) test strip CHECK BLOOD SUGARS DAILY 08/20/19   TaMidge MiniumMD  metFORMIN (GLUCOPHAGE-XR) 500 MG 24 hr tablet Take 500 mg by mouth daily with breakfast. Patient not taking: Reported on 09/16/2020 03/23/16   [provider]  Vitamin D, Ergocalciferol, (DRISDOL) 1.25 MG (50000 UNIT) CAPS capsule TAKE 1 CAPSULE BY MOUTH ONE TIME PER WEEK Patient not taking: No sig reported 06/17/20   [name], CaBilley Co MD    Current Facility-Administered Medications  Medication Dose Route Frequency Provider Last Rate Last Admin   insulin aspart (novoLOG) injection 0-9 Units  0-9 Units Subcutaneous Q4H Adefeso, Oladapo, DO       Current Outpatient Medications  Medication Sig Dispense Refill   amLODipine (NORVASC) 10 MG tablet TAKE 1 TABLET BY MOUTH EVERY DAY 90 tablet 2   BIOTIN PO Take 1 tablet by mouth 2 (two) times daily. [employer] [employer] [facility]     DULoxetine (CYMBALTA) 30 MG  capsule Take 3 capsules (90 mg total) by mouth daily. 270 capsule 1   fenofibrate (TRICOR) 48 MG tablet Take 48 mg by mouth daily.     furosemide (LASIX) 20 MG tablet TAKE 1 TABLET BY MOUTH AS NEEDED (Patient taking differently: Take 20 mg by mouth daily. Only takes as needed) 90 tablet 1   HYDROcodone-acetaminophen (NORCO/VICODIN) 5-325 MG tablet Take 1 tablet by mouth every 6 (six) hours as needed. 6 tablet 0   labetalol (NORMODYNE) 100 MG tablet Take 1 tablet (100 mg total) by mouth 2 (two) times daily. 180 tablet 2   levothyroxine (SYNTHROID) 25 MCG tablet TAKE 1 TABLET BY MOUTH EVERY DAY BEFORE BREAKFAST (Patient taking differently: Take 25 mcg by mouth daily before breakfast.) 90 tablet 1   Magnesium 500 MG TABS Take 1 tablet by mouth daily.     mirtazapine (REMERON) 7.5 MG tablet Take 1 tablet (7.5 mg total) by mouth at bedtime. 90 tablet 1   nitroGLYCERIN (NITROSTAT) 0.4 MG SL tablet Place 1 tablet (0.4  mg total) under the tongue every 5 (five) minutes as needed for chest pain. 30 tablet 3   predniSONE (DELTASONE) 10 MG tablet Take 10 mg by mouth daily with breakfast.     RESTASIS MULTIDOSE 0.05 % ophthalmic emulsion Place 1 drop into both eyes 2 (two) times daily.   6   rosuvastatin (CRESTOR) 20 MG tablet Take 20 mg by mouth daily.     valsartan (DIOVAN) 320 MG tablet Take 1 tablet (320 mg total) by mouth daily. 90 tablet 2   warfarin (COUMADIN) 5 MG tablet 45m on Sunday, 2.530mAll other days (Patient taking differently: Take 5 mg by mouth See admin instructions. Take 1 tablet on Sunday, Monday and Tuesday and all other days 2.5 tablet daily) 70 tablet 2   Accu-Chek Softclix Lancets lancets USE AS INSTRUCTED TO TEST SUGARS TWICE A DAY 100 each 12   Blood Glucose Monitoring Suppl (ACCU-CHEK GUIDE) w/Device KIT 1 each by Does not apply route 2 (two) times daily. To test sugars. Dx. E11.9 1 kit 1   Colchicine (MITIGARE) 0.6 MG CAPS Take 1 capsule by mouth 2 (two) times daily as needed. (Patient  not taking: No sig reported) 60 capsule 3   glucose blood (ACCU-CHEK GUIDE) test strip CHECK BLOOD SUGARS DAILY 100 strip 12   metFORMIN (GLUCOPHAGE-XR) 500 MG 24 hr tablet Take 500 mg by mouth daily with breakfast. (Patient not taking: Reported on 09/16/2020)  3   Vitamin D, Ergocalciferol, (DRISDOL) 1.25 MG (50000 UNIT) CAPS capsule TAKE 1 CAPSULE BY MOUTH ONE TIME PER WEEK (Patient not taking: No sig reported) 12 capsule 1    Allergies as of 09/16/2020 - Review Complete 09/16/2020  Allergen Reaction Noted   Anaprox [naproxen sodium]  11/14/2010   Lipitor [atorvastatin]  03/01/2014   Morphine  08/27/2008   Meperidine Nausea And Vomiting 10/05/2014   Naproxen sodium Nausea And Vomiting 10/05/2014    Past Medical History:  Diagnosis Date   Anemia    Anxiety    [place]   Depression    Diabetes mellitus type II    Fibromyalgia    GERD (gastroesophageal reflux disease)    Hyperlipidemia    Hypertension    Malignant neoplasm of breast (female), unspecified site 1993   L breast s/p mastectomy and tamoxifen x [id number] Other pulmonary embolism and infarction 2008 and 2009   chronic anticoag - [facility]   Unspecified hypothyroidism     Past Surgical History:  Procedure Laterality Date   APPENDECTOMY     BREAST BIOPSY Left 1993   BREAST BIOPSY Right 09/25/2014   stero. Benign   BREAST RECONSTRUCTION Left    CATARACT EXTRACTION     MASTECTOMY Left    ROTATOR CUFF REPAIR     SPINAL FUSION      x 2   TOTAL ABDOMINAL HYSTERECTOMY W/ BILATERAL SALPINGOOPHORECTOMY     TUBAL LIGATION      Family History  Problem Relation Age of Onset   Depression Other        Parent   Arthritis Other        Parent, Grandparent   Hypertension Other        Grandparent   Hyperlipidemia Other        [facility]riages / Stillbirths Other        Grandparent   Stroke Other        [facility] Maternal Uncle  prostate   Hypertension Maternal Grandfather    Breast cancer Cousin     Breast cancer Cousin     Social History   Socioeconomic History   Marital status: Married    Spouse name: Not on file   Number of children: 1   Years of education: Not on file   Highest education level: Not on file  Occupational History   Occupation: Armed forces technical officer: RETIRED   Occupation: Emergency planning/management officer   Occupation: school bus driver  Tobacco Use   Smoking status: Never   Smokeless tobacco: Never  Scientific laboratory technician Use: Never used  Substance and Sexual Activity   Alcohol use: No    Alcohol/week: 0.0 standard drinks   Drug use: No   Sexual activity: Not on file  Other Topics Concern   Not on file  Social History Narrative   Not on file   Social Determinants of Health   Financial Resource Strain: Low Risk    Difficulty of Paying Living Expenses: Not hard at all  Food Insecurity: No Food Insecurity   Worried About Charity fundraiser in the Last Year: Never true   [place] in the Last Year: Never true  Transportation Needs: No Transportation Needs   Lack of Transportation (Medical): No   Lack of Transportation (Non-Medical): No  Physical Activity: Inactive   Days of Exercise per Week: 0 days   Minutes of Exercise per Session: 0 min  Stress: No Stress Concern Present   Feeling of Stress : Only a little  Social Connections: Moderately Integrated   Frequency of Communication with Friends and Family: More than three times a week   Frequency of Social Gatherings with Friends and Family: Once a week   Attends Religious Services: 1 to 4 times per year   Active Member of [employer] or Organizations: No   Attends Music therapist: Never   Marital Status: Married  Human resources officer Violence: Not At Risk   Fear of Current or Ex-Partner: No   Emotionally Abused: No   Physically Abused: No   Sexually Abused: No     ROS:  General: Negative for anorexia, weight loss, fever, chills, fatigue, weakness. Eyes: Negative for vision changes.   ENT: Negative for hoarseness, difficulty swallowing , nasal congestion. CV: Negative for chest pain, angina, palpitations, dyspnea on exertion, peripheral edema.  Respiratory: Negative for dyspnea at rest, dyspnea on exertion, cough, sputum, wheezing.  GI: See history of present illness. GU:  Negative for dysuria, hematuria, urinary incontinence, urinary frequency, nocturnal urination.  MS: Negative for  low back pain.  Chronic neck pain.  Chronic wrist pain. Derm: Negative for rash or itching.  Neuro: Negative for weakness, abnormal sensation, seizure, frequent headaches, memory loss, confusion.  Psych: Negative for anxiety, depression, suicidal ideation, hallucinations.  Endo: Negative for unusual weight change.  Heme: Negative for bruising or bleeding. Allergy: Negative for rash or hives.       Physical Examination: Vital signs in last 24 hours: Temp:  [97.7 F (36.5 C)] 97.7 F (36.5 C) (06/30 1402) Pulse Rate:  [44-65] 53 (07/01 0700) Resp:  [11-22] 18 (07/01 0700) BP: (120-151)/(55-64) 148/62 (07/01 0700) SpO2:  [94 %-100 %] 94 % (07/01 0700)    General: Well-nourished, well-developed in no acute distress.  Head: Normocephalic, atraumatic.   Eyes: Conjunctiva pink, no icterus. Mouth: Oropharyngeal mucosa moist and pink , no lesions erythema or exudate. Neck: Supple without thyromegaly, masses, or lymphadenopathy.  Lungs: Clear to auscultation bilaterally.  Heart: Regular rate and rhythm, no murmurs rubs or gallops.  Abdomen: Bowel sounds are normal, nontender, nondistended, no hepatosplenomegaly or masses, no abdominal bruits or    hernia , no rebound or guarding.   Rectal: Not performed Extremities: No lower extremity edema, clubbing, deformity.  Neuro: Alert and oriented x 4 , grossly normal neurologically.  Skin: Warm and dry, no rash or jaundice.   Psych: Alert and cooperative, normal mood and affect.        Intake/Output from previous day: 06/30 0701 - 07/01  0700 In: 1438.8 [I.V.:1188.8; IV Piggyback:250] Out: -  Intake/Output this shift: No intake/output data recorded.  Lab Results: CBC Recent Labs    09/16/20 1552 09/17/20 0345  WBC 6.2 7.6  HGB 10.9* 9.8*  HCT 32.8* 29.8*  MCV 94.8 94.6  PLT 238 197   BMET Recent Labs    09/16/20 1552 09/17/20 0345  NA 136 139  K 5.0 4.2  CL 106 112*  CO2 21* 21*  GLUCOSE 218* 111*  BUN 71* 52*  CREATININE 1.72* 1.22*  CALCIUM 9.9 8.9   LFT Recent Labs    09/16/20 1552 09/17/20 0345  BILITOT 0.5 0.3  ALKPHOS 43 33*  AST 52* 35  ALT 48* 35  PROT 7.0 5.8*  ALBUMIN 3.7 3.1*    Lipase Recent Labs    09/16/20 1552  LIPASE 62*    PT/INR Recent Labs    09/16/20 1552 09/17/20 0345  LABPROT 25.3* 26.3*  INR 2.3* 2.4*      Imaging Studies: CT ABDOMEN PELVIS WO CONTRAST  Result Date: 09/16/2020 CLINICAL DATA:  Nonlocalized abdominal pain emesis after eating EXAM: CT ABDOMEN AND PELVIS WITHOUT CONTRAST TECHNIQUE: Multidetector CT imaging of the abdomen and pelvis was performed following the standard protocol without IV contrast. COMPARISON:  CT 08/22/2010 FINDINGS: Lower chest: Lung bases demonstrate no acute consolidation or effusion. Cardiomediastinal silhouette within normal limits Hepatobiliary: No focal liver abnormality is seen. No gallstones, gallbladder wall thickening, or biliary dilatation. Pancreas: Unremarkable. No pancreatic ductal dilatation or surrounding inflammatory changes. Spleen: Peripherally calcified cyst in the posterior spleen. Adrenals/Urinary Tract: Adrenal glands are unremarkable. Kidneys are normal, without renal calculi, focal lesion, or hydronephrosis. Bladder is unremarkable. Stomach/Bowel: Stomach is within normal limits. History of appendectomy. No evidence of bowel wall thickening, distention, or inflammatory changes. Scattered colon diverticula without acute wall thickening. Vascular/Lymphatic: Moderate aortic atherosclerosis. No aneurysm. No  suspicious nodes Reproductive: Status post hysterectomy. No adnexal masses. Other: Negative for free air or free fluid. Small metallic densities within the anterior abdominal wall. Musculoskeletal: No acute or significant osseous findings. IMPRESSION: No CT evidence for acute intra-abdominal or pelvic abnormality Electronically Signed   By: Donavan Foil M.D.   On: 09/16/2020 20:47   DG Chest 2 View  Result Date: 09/16/2020 CLINICAL DATA:  Shortness of breath. EXAM: CHEST - 2 VIEW COMPARISON:  Chest x-ray dated October 17, 2018. FINDINGS: The heart size and mediastinal contours are within normal limits. Both lungs are clear. The visualized skeletal structures are unremarkable. IMPRESSION: No active cardiopulmonary disease. Electronically Signed   By: Titus Dubin M.D.   On: 09/16/2020 16:44   DG Wrist Complete Right  Result Date: 09/12/2020 CLINICAL DATA:  Right wrist pain.  No known injury. EXAM: RIGHT WRIST - COMPLETE 3+ VIEW COMPARISON:  None. FINDINGS: Arthritic changes throughout the right wrist, most pronounced at the 1st carpometacarpal joint with joint space narrowing and spurring. No acute bony abnormality. Specifically, no  fracture, subluxation, or dislocation. 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 No acute bony abnormality. Electronically Signed   By: Rolm Baptise M.D.   On: 09/12/2020 20:58  [4 week]   Impression: 80 year old female with past medical history significant for anemia, PEs chronically anticoagulated with Coumadin, breast cancer, diabetes, hypertension, anxiety/depression, hypothyroidism presenting to the emergency department with complaints of difficulty swallowing.  In the ED she was noted to have normocytic anemia, acute renal failure, mildly elevated transaminases.  CT abdomen pelvis without contrast was unremarkable.  Chest x-ray unremarkable.  Dysphagia: History of esophageal stricture status post dilation in 2008.  Over the past 5 weeks  she has had progressive dysphagia, worse the past 1 week.  Now with vomiting postprandially.  Denies typical heartburn type symptoms.  She has been on a couple courses of oral prednisone in the past several months.  Suspect benign esophageal stricture but cannot exclude Candida esophagitis, reflux esophagitis, less likely malignancy or motility disorder.  Normocytic anemia: Mild anemia with baseline in the 10-11 range 2018 through 2020.  She did have a normal hemoglobin November 2020 of 12.5.  No overt GI bleeding.  B12 and folate normal July 2021.  In 2018 her ferritin and iron were both low.  Check iron studies.  Transaminitis: Mild elevation of AST/ALT on admission, resolved.  CT unremarkable.   Plan: Clear liquid diet today.  N.p.o. after midnight. Pantoprazole 40 mg p.o. twice daily. Iron studies. Upper endoscopy with esophageal dilation once INR less than 1.8.  Given her history of PEs developing when she stopped Coumadin for short period of time, would consider bridging with heparin if/when needed.  Currently she is adequately anticoagulated, will recheck INR in the morning.  I have discussed the risks, alternatives, benefits with regards to but not limited to the risk of reaction to medication, bleeding, infection, perforation and the patient is agreeable to proceed. Written consent to be obtained. INR in AM.   We would like to thank you for the opportunity to participate in the care of Andrea Simmons.    LOS: 1 day

## 2020-09-17 NOTE — Progress Notes (Signed)
PROGRESS NOTE    Andrea Simmons  YJE:[id number] DOB: November 11, 1940 DOA: 09/16/2020 PCP: [name], MD   Brief Narrative:   Andrea Simmons is a 80 y.o. female with medical history significant for hypertension, PE on warfarin, depression, anxiety who presents to the emergency department due to 2-week onset of difficulty swallowing.  Patient has been admitted with esophageal dysphagia and further plans for evaluation will be ordered per GI.  Assessment & Plan:   Principal Problem:   Esophageal dysphagia Active Problems:   Hypothyroidism   Essential hypertension   Recurrent pulmonary emboli (HCC)   Hyperlipidemia   Dehydration   AKI (acute kidney injury) (HCC)   Elevated lipase   Hyperglycemia due to diabetes mellitus (HCC)   Esophageal dysphagia -No significant weight loss -Endorses 2 weeks of trouble swallowing with solids and liquids -Likely EGD with potential dilation per GI -Continue aspiration precautions  AKI-improving -Likely due to dehydration, continue gentle hydration -Avoid nephrotoxic agents -Monitor repeat labs  Hyperglycemia secondary to type 2 diabetes-improved -Sliding scale insulin  Hypothyroidism -Continue Synthroid  Essential hypertension-controlled -Continue Norvasc and labetalol  Dyslipidemia -Holding statin for now due to transaminitis  History of pulmonary emboli -Warfarin currently held -INR 2.3, continue to monitor  Depression -Remeron  DVT prophylaxis: SCDs Code Status: Full Family Communication: None at bedside Disposition Plan:  Status is: Inpatient  Remains inpatient appropriate because:Ongoing diagnostic testing needed not appropriate for outpatient work up  Dispo: The patient is from: Home              Anticipated d/c is to: Home              Patient currently is not medically stable to d/c.   Difficult to place patient No  Consultants:  GI  Procedures:  See below  Antimicrobials:   None   Subjective: Patient seen and evaluated today with no new acute complaints or concerns. No acute concerns or events noted overnight.  Objective: Vitals:   09/17/20 0345 09/17/20 0400 09/17/20 0700 09/17/20 0800  BP: (!) 131/59 (!) 151/64 (!) 148/62 (!) 150/65  Pulse: (!) 51 (!) 47 (!) 53 60  Resp: 12 14 18  (!) 21  Temp:      TempSrc:      SpO2: 97% 94% 94% 98%    Intake/Output Summary (Last 24 hours) at 09/17/2020 0950 Last data filed at 09/17/2020 0458 Gross per 24 hour  Intake 1438.81 ml  Output --  Net 1438.81 ml   There were no vitals filed for this visit.  Examination:  General exam: Appears calm and comfortable  Respiratory system: Clear to auscultation. Respiratory effort normal. Cardiovascular system: S1 & S2 heard, RRR.  Gastrointestinal system: Abdomen is soft Central nervous system: Alert and awake Extremities: No edema Skin: No significant lesions noted Psychiatry: Flat affect.    Data Reviewed: I have personally reviewed following labs and imaging studies  CBC: Recent Labs  Lab 09/16/20 1552 09/17/20 0345  WBC 6.2 7.6  NEUTROABS 5.4  --   HGB 10.9* 9.8*  HCT 32.8* 29.8*  MCV 94.8 94.6  PLT 238 637   Basic Metabolic Panel: Recent Labs  Lab 09/16/20 1552 09/17/20 0345  NA 136 139  K 5.0 4.2  CL 106 112*  CO2 21* 21*  GLUCOSE 218* 111*  BUN 71* 52*  CREATININE 1.72* 1.22*  CALCIUM 9.9 8.9  MG  --  1.7  PHOS  --  3.0   GFR: Estimated Creatinine Clearance: 36.5 mL/min (A) (  by C-G formula based on SCr of 1.22 mg/dL (H)). Liver Function Tests: Recent Labs  Lab 09/16/20 1552 09/17/20 0345  AST 52* 35  ALT 48* 35  ALKPHOS 43 33*  BILITOT 0.5 0.3  PROT 7.0 5.8*  ALBUMIN 3.7 3.1*   Recent Labs  Lab 09/16/20 1552  LIPASE 62*   No results for input(s): AMMONIA in the last 168 hours. Coagulation Profile: Recent Labs  Lab 09/16/20 1552 09/17/20 0345  INR 2.3* 2.4*   Cardiac Enzymes: No results for input(s): CKTOTAL,  CKMB, CKMBINDEX, TROPONINI in the last 168 hours. BNP (last 3 results) No results for input(s): PROBNP in the last 8760 hours. HbA1C: No results for input(s): HGBA1C in the last 72 hours. CBG: Recent Labs  Lab 09/17/20 0404  GLUCAP 98   Lipid Profile: No results for input(s): CHOL, HDL, LDLCALC, TRIG, CHOLHDL, LDLDIRECT in the last 72 hours. Thyroid Function Tests: No results for input(s): TSH, T4TOTAL, FREET4, T3FREE, THYROIDAB in the last 72 hours. Anemia Panel: No results for input(s): VITAMINB12, FOLATE, FERRITIN, TIBC, IRON, RETICCTPCT in the last 72 hours. Sepsis Labs: No results for input(s): PROCALCITON, LATICACIDVEN in the last 168 hours.  Recent Results (from the past 240 hour(s))  Resp Panel by RT-PCR (Flu A&B, Covid) Nasopharyngeal Swab     Status: None   Collection Time: 09/16/20  9:29 PM   Specimen: Nasopharyngeal Swab; Nasopharyngeal(NP) swabs in vial transport medium  Result Value Ref Range Status   SARS Coronavirus 2 by RT PCR NEGATIVE NEGATIVE Final    Comment: (NOTE) SARS-CoV-2 target nucleic acids are NOT DETECTED.  The SARS-CoV-2 RNA is generally detectable in upper respiratory specimens during the acute phase of infection. The lowest concentration of SARS-CoV-2 viral copies this assay can detect is 138 copies/mL. A negative result does not preclude SARS-Cov-2 infection and should not be used as the sole basis for treatment or other patient management decisions. A negative result may occur with  improper specimen collection/handling, submission of specimen other than nasopharyngeal swab, presence of viral mutation(s) within the areas targeted by this assay, and inadequate number of viral copies(<138 copies/mL). A negative result must be combined with clinical observations, patient history, and epidemiological information. The expected result is Negative.  Fact Sheet for Patients:  EntrepreneurPulse.com.au  Fact Sheet for Healthcare  Providers:  IncredibleEmployment.be  This test is no t yet approved or cleared by the [place] FDA and  has been authorized for detection and/or diagnosis of SARS-CoV-2 by FDA under an Emergency Use Authorization (EUA). This EUA will remain  in effect (meaning this test can be used) for the duration of the COVID-19 declaration under Section 564(b)(1) of the Act, 21 U.S.C.section 360bbb-3(b)(1), unless the authorization is terminated  or revoked sooner.       Influenza A by PCR NEGATIVE NEGATIVE Final   Influenza B by PCR NEGATIVE NEGATIVE Final    Comment: (NOTE) The Xpert Xpress SARS-CoV-2/FLU/RSV plus assay is intended as an aid in the diagnosis of influenza from Nasopharyngeal swab specimens and should not be used as a sole basis for treatment. Nasal washings and aspirates are unacceptable for Xpert Xpress SARS-CoV-2/FLU/RSV testing.  Fact Sheet for Patients: EntrepreneurPulse.com.au  Fact Sheet for Healthcare Providers: IncredibleEmployment.be  This test is not yet approved or cleared by the [place] FDA and has been authorized for detection and/or diagnosis of SARS-CoV-2 by FDA under an Emergency Use Authorization (EUA). This EUA will remain in effect (meaning this test can be used) for the duration of  the COVID-19 declaration under Section 564(b)(1) of the Act, 21 U.S.C. section 360bbb-3(b)(1), unless the authorization is terminated or revoked.  Performed at [facility], [address], [place], [place] [address]          Radiology Studies: CT ABDOMEN PELVIS WO CONTRAST  Result Date: 09/16/2020 CLINICAL DATA:  Nonlocalized abdominal pain emesis after eating EXAM: CT ABDOMEN AND PELVIS WITHOUT CONTRAST TECHNIQUE: Multidetector CT imaging of the abdomen and pelvis was performed following the standard protocol without IV contrast. COMPARISON:  CT 08/22/2010 FINDINGS: Lower chest: Lung bases demonstrate no  acute consolidation or effusion. Cardiomediastinal silhouette within normal limits Hepatobiliary: No focal liver abnormality is seen. No gallstones, gallbladder wall thickening, or biliary dilatation. Pancreas: Unremarkable. No pancreatic ductal dilatation or surrounding inflammatory changes. Spleen: Peripherally calcified cyst in the posterior spleen. Adrenals/Urinary Tract: Adrenal glands are unremarkable. Kidneys are normal, without renal calculi, focal lesion, or hydronephrosis. Bladder is unremarkable. Stomach/Bowel: Stomach is within normal limits. History of appendectomy. No evidence of bowel wall thickening, distention, or inflammatory changes. Scattered colon diverticula without acute wall thickening. Vascular/Lymphatic: Moderate aortic atherosclerosis. No aneurysm. No suspicious nodes Reproductive: Status post hysterectomy. No adnexal masses. Other: Negative for free air or free fluid. Small metallic densities within the anterior abdominal wall. Musculoskeletal: No acute or significant osseous findings. IMPRESSION: No CT evidence for acute intra-abdominal or pelvic abnormality Electronically Signed   By: Donavan Foil M.D.   On: 09/16/2020 20:47   DG Chest 2 View  Result Date: 09/16/2020 CLINICAL DATA:  Shortness of breath. EXAM: CHEST - 2 VIEW COMPARISON:  Chest x-ray dated October 17, 2018. FINDINGS: The heart size and mediastinal contours are within normal limits. Both lungs are clear. The visualized skeletal structures are unremarkable. IMPRESSION: No active cardiopulmonary disease. Electronically Signed   By: Titus Dubin M.D.   On: 09/16/2020 16:44        Scheduled Meds:  insulin aspart  0-9 Units Subcutaneous Q4H     LOS: 1 day    Time spent: 35 minutes    Andrea Seivert Darleen Crocker, DO [facility]  If 7PM-7AM, please contact night-coverage [email or web address] 09/17/2020, 9:50 AM

## 2020-09-17 NOTE — Care Management Important Message (Signed)
Important Message  Patient Details  Name: Andrea Simmons MRN: [id number] Date of Birth: 1940/07/21   Medicare Important Message Given:  Yes     Andrea Simmons 09/17/2020, 4:02 PM

## 2020-09-18 LAB — CBC
HCT: 33.9 % — ABNORMAL LOW (ref 36.0–46.0)
Hemoglobin: 10.8 g/dL — ABNORMAL LOW (ref 12.0–15.0)
MCH: 30.6 pg (ref 26.0–34.0)
MCHC: 31.9 g/dL (ref 30.0–36.0)
MCV: 96 fL (ref 80.0–100.0)
Platelets: 215 10*3/uL (ref 150–400)
RBC: 3.53 MIL/uL — ABNORMAL LOW (ref 3.87–5.11)
RDW: 12.9 % (ref 11.5–15.5)
WBC: 6.2 10*3/uL (ref 4.0–10.5)
nRBC: 0 % (ref 0.0–0.2)

## 2020-09-18 LAB — COMPREHENSIVE METABOLIC PANEL
ALT: 34 U/L (ref 0–44)
AST: 34 U/L (ref 15–41)
Albumin: 3.2 g/dL — ABNORMAL LOW (ref 3.5–5.0)
Alkaline Phosphatase: 36 U/L — ABNORMAL LOW (ref 38–126)
Anion gap: 9 (ref 5–15)
BUN: 25 mg/dL — ABNORMAL HIGH (ref 8–23)
CO2: 26 mmol/L (ref 22–32)
Calcium: 9 mg/dL (ref 8.9–10.3)
Chloride: 109 mmol/L (ref 98–111)
Creatinine, Ser: 1.09 mg/dL — ABNORMAL HIGH (ref 0.44–1.00)
GFR, Estimated: 52 mL/min — ABNORMAL LOW (ref >60)
Glucose, Bld: 94 mg/dL (ref 70–99)
Potassium: 4.2 mmol/L (ref 3.5–5.1)
Sodium: 144 mmol/L (ref 135–145)
Total Bilirubin: 0.6 mg/dL (ref 0.3–1.2)
Total Protein: 6 g/dL — ABNORMAL LOW (ref 6.5–8.1)

## 2020-09-18 LAB — HEMOGLOBIN A1C
Hgb A1c MFr Bld: 6.5 % — ABNORMAL HIGH (ref 4.8–5.6)
Mean Plasma Glucose: 140 mg/dL

## 2020-09-18 LAB — PROTIME-INR
INR: 2.1 — ABNORMAL HIGH (ref 0.8–1.2)
Prothrombin Time: 23.1 seconds — ABNORMAL HIGH (ref 11.4–15.2)

## 2020-09-18 LAB — GLUCOSE, CAPILLARY
Glucose-Capillary: 88 mg/dL (ref 70–99)
Glucose-Capillary: 103 mg/dL — ABNORMAL HIGH (ref 70–99)
Glucose-Capillary: 112 mg/dL — ABNORMAL HIGH (ref 70–99)

## 2020-09-18 LAB — MAGNESIUM: Magnesium: 1.3 mg/dL — ABNORMAL LOW (ref 1.7–2.4)

## 2020-09-18 MED ORDER — PANTOPRAZOLE SODIUM 40 MG PO TBEC: 40.0000 mg | DELAYED_RELEASE_TABLET | Freq: Two times a day (BID) | ORAL | 2 refills | Status: DC

## 2020-09-18 MED ORDER — LACTATED RINGERS IV SOLN: INTRAVENOUS | Status: DC

## 2020-09-18 MED ORDER — MAGNESIUM SULFATE 2 GM/50ML IV SOLN
2.0000 g | Freq: Once | INTRAVENOUS | Status: AC
  Administered 2020-09-18: 2 g via INTRAVENOUS
  Filled 2020-09-18: qty 50

## 2020-09-18 NOTE — Progress Notes (Signed)
Subjective: Patient doing well today.  Not have any issues with liquids is from a swallowing standpoint.  Epigastric pain improved.  Tolerating twice daily PPI.  INR 2.1.  Objective: Vital signs in last 24 hours: Temp:  [97.6 F (36.4 C)-98.7 F (37.1 C)] 97.9 F (36.6 C) (07/02 1014) Pulse Rate:  [47-60] 47 (07/02 1014) Resp:  [16-20] 18 (07/02 1014) BP: (113-157)/(47-85) 157/68 (07/02 1014) SpO2:  [96 %-100 %] 99 % (07/02 1014) Weight:  [71.7 kg] 71.7 kg (07/01 1127) Last BM Date: 09/15/20 General:   Alert and oriented, pleasant Head:  Normocephalic and atraumatic. Eyes:  No icterus, sclera clear. Conjuctiva pink.  Mouth:  Without lesions, mucosa pink and moist.  Neck:  Supple, without thyromegaly or masses.  Abdomen:  Bowel sounds present, soft, non-tender, non-distended. No HSM or hernias noted. No rebound or guarding. No masses appreciated  Msk:  Symmetrical without gross deformities. Normal posture. Extremities:  Without clubbing or edema. Neurologic:  Alert and  oriented x4;  grossly normal neurologically. Skin:  Warm and dry, intact without significant lesions.  Cervical Nodes:  No significant cervical adenopathy. Psych:  Alert and cooperative. Normal mood and affect.  Intake/Output from previous day: 07/01 0701 - 07/02 0700 In: 247.9 [I.V.:247.9] Out: -  Intake/Output this shift: No intake/output data recorded.  Lab Results: Recent Labs    09/16/20 1552 09/17/20 0345 09/18/20 0627  WBC 6.2 7.6 6.2  HGB 10.9* 9.8* 10.8*  HCT 32.8* 29.8* 33.9*  PLT 238 197 215   BMET Recent Labs    09/16/20 1552 09/17/20 0345 09/18/20 0627  NA 136 139 144  K 5.0 4.2 4.2  CL 106 112* 109  CO2 21* 21* 26  GLUCOSE 218* 111* 94  BUN 71* 52* 25*  CREATININE 1.72* 1.22* 1.09*  CALCIUM 9.9 8.9 9.0   LFT Recent Labs    09/16/20 1552 09/17/20 0345 09/18/20 0627  PROT 7.0 5.8* 6.0*  ALBUMIN 3.7 3.1* 3.2*  AST 52* 35 34  ALT 48* 35 34  ALKPHOS 43 33* 36*  BILITOT  0.5 0.3 0.6   PT/INR Recent Labs    09/17/20 0345 09/18/20 0627  LABPROT 26.3* 23.1*  INR 2.4* 2.1*   Hepatitis Panel No results for input(s): HEPBSAG, HCVAB, HEPAIGM, HEPBIGM in the last 72 hours.   Studies/Results: CT ABDOMEN PELVIS WO CONTRAST  Result Date: 09/16/2020 CLINICAL DATA:  Nonlocalized abdominal pain emesis after eating EXAM: CT ABDOMEN AND PELVIS WITHOUT CONTRAST TECHNIQUE: Multidetector CT imaging of the abdomen and pelvis was performed following the standard protocol without IV contrast. COMPARISON:  CT 08/22/2010 FINDINGS: Lower chest: Lung bases demonstrate no acute consolidation or effusion. Cardiomediastinal silhouette within normal limits Hepatobiliary: No focal liver abnormality is seen. No gallstones, gallbladder wall thickening, or biliary dilatation. Pancreas: Unremarkable. No pancreatic ductal dilatation or surrounding inflammatory changes. Spleen: Peripherally calcified cyst in the posterior spleen. Adrenals/Urinary Tract: Adrenal glands are unremarkable. Kidneys are normal, without renal calculi, focal lesion, or hydronephrosis. Bladder is unremarkable. Stomach/Bowel: Stomach is within normal limits. History of appendectomy. No evidence of bowel wall thickening, distention, or inflammatory changes. Scattered colon diverticula without acute wall thickening. Vascular/Lymphatic: Moderate aortic atherosclerosis. No aneurysm. No suspicious nodes Reproductive: Status post hysterectomy. No adnexal masses. Other: Negative for free air or free fluid. Small metallic densities within the anterior abdominal wall. Musculoskeletal: No acute or significant osseous findings. IMPRESSION: No CT evidence for acute intra-abdominal or pelvic abnormality Electronically Signed   By: Donavan Foil M.D.   On: 09/16/2020  20:47   DG Chest 2 View  Result Date: 09/16/2020 CLINICAL DATA:  Shortness of breath. EXAM: CHEST - 2 VIEW COMPARISON:  Chest x-ray dated October 17, 2018. FINDINGS: The heart  size and mediastinal contours are within normal limits. Both lungs are clear. The visualized skeletal structures are unremarkable. IMPRESSION: No active cardiopulmonary disease. Electronically Signed   By: Titus Dubin M.D.   On: 09/16/2020 16:44    Assessment: *Dysphagia/odynophagia *Epigastric pain *Chronic anticoagulation with Coumadin  Plan: Patient's INR 2.1 today which places patient at increased risk of bleeding for esophageal dilation.  She states she is tolerating liquids without any issues.  I will advance her to a soft diet today and see how she does.  If she continues to have dysphagia/odynophagia this afternoon then we will plan on EGD with likely dilation in the a.m. once INR is 1.8 or lower.  Continue on twice daily PPI.  Given her recent prednisone use, she is at risk for candidal esophagitis.  We will make n.p.o. after midnight in case of endoscopy in the a.m.  Please check INR first thing in the morning.  GI to continue to follow.  If patient tolerating soft diet today then she may be able to be discharged home this afternoon with outpatient endoscopy arranged, we will reevaluate this afternoon.  Elon Alas. Abbey Chatters, D.O. Gastroenterology and Hepatology [facility]   LOS: 2 days    09/18/2020, 10:43 AM

## 2020-09-18 NOTE — Discharge Summary (Signed)
Physician Discharge Summary  Andrea Simmons QIW:[id number] DOB: [birth year] DOA: 09/16/2020  PCP: [name], MD  Admit date: 09/16/2020  Discharge date: 09/18/2020  Admitted From:Home  Disposition:  Home  Recommendations for Outpatient Follow-up:  Follow up with PCP in 1-2 weeks Follow-up with outpatient EGD will be set up by GI since soft diet is currently tolerated Continue PPI twice daily Continue other home medications as prior  Home Health: None  Equipment/Devices: None  Discharge Condition:Stable  CODE STATUS: Full  Diet recommendation: Soft diet  Brief/Interim Summary:  Andrea Simmons is a 80 y.o. female with medical history significant for hypertension, PE on warfarin, depression, anxiety who presents to the emergency department due to 2-week onset of difficulty swallowing.  Patient has been admitted with esophageal dysphagia with plans for EGD evaluation and dilation as needed.  Since she is on warfarin, this was held during the course of the admission with plans to pursue EGD if her INR was less than 1.8.  On the morning of 7/2 her INR was 2.1 and therefore decision was made to see whether or not she would tolerate a soft diet.  She is able to tolerate a soft diet at this time as well as some chopped foods with no pain or discomfort with swallowing.  GI plans to follow-up in the outpatient setting to arrange for endoscopy at that time and she is otherwise stable for discharge.  Recommendations are to have her continue on PPI twice daily.  Discharge Diagnoses:  Principal Problem:   Esophageal dysphagia Active Problems:   Hypothyroidism   Essential hypertension   Recurrent pulmonary emboli (HCC)   Hyperlipidemia   Dehydration   AKI (acute kidney injury) (HCC)   Elevated lipase   Hyperglycemia due to diabetes mellitus ([place])  Principal discharge diagnosis: Esophageal dysphagia with plans to pursue further testing outpatient.  Discharge  Instructions  Discharge Instructions     Diet - low sodium heart healthy   Complete by: As directed    Increase activity slowly   Complete by: As directed       Allergies as of 09/18/2020       Reactions   Anaprox [naproxen Sodium]    Lipitor [atorvastatin]    Morphine    Meperidine Nausea And Vomiting   Naproxen Sodium Nausea And Vomiting        Medication List     TAKE these medications    Accu-Chek Guide test strip Generic drug: glucose blood CHECK BLOOD SUGARS DAILY   Accu-Chek Guide w/Device Kit 1 each by Does not apply route 2 (two) times daily. To test sugars. Dx. E11.9   Accu-Chek Softclix Lancets lancets USE AS INSTRUCTED TO TEST SUGARS TWICE A DAY   amLODipine 10 MG tablet Commonly known as: NORVASC TAKE 1 TABLET BY MOUTH EVERY DAY   BIOTIN PO Take 1 tablet by mouth 2 (two) times daily. [employer] - [facility]   Colchicine 0.6 MG Caps Commonly known as: Mitigare Take 1 capsule by mouth 2 (two) times daily as needed.   DULoxetine 30 MG capsule Commonly known as: CYMBALTA Take 3 capsules (90 mg total) by mouth daily.   fenofibrate 48 MG tablet Commonly known as: TRICOR Take 48 mg by mouth daily.   furosemide 20 MG tablet Commonly known as: LASIX TAKE 1 TABLET BY MOUTH AS NEEDED What changed:  when to take this additional instructions   HYDROcodone-acetaminophen 5-325 MG tablet Commonly known as: NORCO/VICODIN Take 1 tablet by mouth every  6 (six) hours as needed.   labetalol 100 MG tablet Commonly known as: NORMODYNE Take 1 tablet (100 mg total) by mouth 2 (two) times daily.   levothyroxine 25 MCG tablet Commonly known as: SYNTHROID TAKE 1 TABLET BY MOUTH EVERY DAY BEFORE BREAKFAST What changed: See the new instructions.   Magnesium 500 MG Tabs Take 1 tablet by mouth daily.   metFORMIN 500 MG 24 hr tablet Commonly known as: GLUCOPHAGE-XR Take 500 mg by mouth daily with breakfast.   mirtazapine 7.5 MG tablet Commonly known as:  REMERON Take 1 tablet (7.5 mg total) by mouth at bedtime.   nitroGLYCERIN 0.4 MG SL tablet Commonly known as: NITROSTAT Place 1 tablet (0.4 mg total) under the tongue every 5 (five) minutes as needed for chest pain.   pantoprazole 40 MG tablet Commonly known as: PROTONIX Take 1 tablet (40 mg total) by mouth 2 (two) times daily before a meal.   predniSONE 10 MG tablet Commonly known as: DELTASONE Take 10 mg by mouth daily with breakfast.   Restasis Multidose 0.05 % ophthalmic emulsion Generic drug: cycloSPORINE Place 1 drop into both eyes 2 (two) times daily.   rosuvastatin 20 MG tablet Commonly known as: CRESTOR Take 20 mg by mouth daily.   valsartan 320 MG tablet Commonly known as: DIOVAN Take 1 tablet (320 mg total) by mouth daily.   Vitamin D (Ergocalciferol) 1.25 MG (50000 UNIT) Caps capsule Commonly known as: DRISDOL TAKE 1 CAPSULE BY MOUTH ONE TIME PER WEEK   warfarin 5 MG tablet Commonly known as: COUMADIN Take as directed. If you are unsure how to take this medication, talk to your nurse or doctor. Original instructions: 8m on Sunday, 2.53mAll other days What changed:  how much to take how to take this when to take this additional instructions        Follow-up Information     TaMidge MiniumMD. Schedule an appointment as soon as possible for a visit in 1 week(s).   Specialty: Family Medicine Contact information: [phone]       [place].   Contact information: [address] [phone]             Allergies  Allergen Reactions   Anaprox [Naproxen Sodium]    Lipitor [Atorvastatin]    Morphine    Meperidine Nausea And Vomiting   Naproxen Sodium Nausea And Vomiting    Consultations: GI   Procedures/Studies: CT ABDOMEN PELVIS WO CONTRAST  Result Date: 09/16/2020 CLINICAL DATA:  Nonlocalized abdominal  pain emesis after eating EXAM: CT ABDOMEN AND PELVIS WITHOUT CONTRAST TECHNIQUE: Multidetector CT imaging of the abdomen and pelvis was performed following the standard protocol without IV contrast. COMPARISON:  CT 08/22/2010 FINDINGS: Lower chest: Lung bases demonstrate no acute consolidation or effusion. Cardiomediastinal silhouette within normal limits Hepatobiliary: No focal liver abnormality is seen. No gallstones, gallbladder wall thickening, or biliary dilatation. Pancreas: Unremarkable. No pancreatic ductal dilatation or surrounding inflammatory changes. Spleen: Peripherally calcified cyst in the posterior spleen. Adrenals/Urinary Tract: Adrenal glands are unremarkable. Kidneys are normal, without renal calculi, focal lesion, or hydronephrosis. Bladder is unremarkable. Stomach/Bowel: Stomach is within normal limits. History of appendectomy. No evidence of bowel wall thickening, distention, or inflammatory changes. Scattered colon diverticula without acute wall thickening. Vascular/Lymphatic: Moderate aortic atherosclerosis. No aneurysm. No suspicious nodes Reproductive: Status post hysterectomy. No adnexal masses. Other: Negative for free air  or free fluid. Small metallic densities within the anterior abdominal wall. Musculoskeletal: No acute or significant osseous findings. IMPRESSION: No CT evidence for acute intra-abdominal or pelvic abnormality Electronically Signed   By: Donavan Foil M.D.   On: 09/16/2020 20:47   DG Chest 2 View  Result Date: 09/16/2020 CLINICAL DATA:  Shortness of breath. EXAM: CHEST - 2 VIEW COMPARISON:  Chest x-ray dated October 17, 2018. FINDINGS: The heart size and mediastinal contours are within normal limits. Both lungs are clear. The visualized skeletal structures are unremarkable. IMPRESSION: No active cardiopulmonary disease. Electronically Signed   By: Titus Dubin M.D.   On: 09/16/2020 16:44   DG Wrist Complete Right  Result Date: 09/12/2020 CLINICAL DATA:  Right  wrist pain.  No known injury. EXAM: RIGHT WRIST - COMPLETE 3+ VIEW COMPARISON:  None. FINDINGS: Arthritic changes throughout the right wrist, most pronounced at the 1st carpometacarpal joint with joint space narrowing and spurring. No acute bony abnormality. Specifically, no fracture, subluxation, or dislocation. 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 No acute bony abnormality. Electronically Signed   By: Rolm Baptise M.D.   On: 09/12/2020 20:58     Discharge Exam: Vitals:   09/18/20 1014 09/18/20 1418  BP: (!) 157/68 134/62  Pulse: (!) 47 (!) 52  Resp: 18 18  Temp: 97.9 F (36.6 C) 98.1 F (36.7 C)  SpO2: 99% 98%   Vitals:   09/17/20 2241 09/18/20 0415 09/18/20 1014 09/18/20 1418  BP:  (!) 113/47 (!) 157/68 134/62  Pulse: 60 (!) 51 (!) 47 (!) 52  Resp:  '18 18 18  ' Temp:  97.6 F (36.4 C) 97.9 F (36.6 C) 98.1 F (36.7 C)  TempSrc:  Oral Oral Oral  SpO2:  96% 99% 98%  Weight:      Height:        General: Pt is alert, awake, not in acute distress Cardiovascular: RRR, S1/S2 +, no rubs, no gallops Respiratory: CTA bilaterally, no wheezing, no rhonchi Abdominal: Soft, NT, ND, bowel sounds + Extremities: no edema, no cyanosis    The results of significant diagnostics from this hospitalization (including imaging, microbiology, ancillary and laboratory) are listed below for reference.     Microbiology: Recent Results (from the past 240 hour(s))  Resp Panel by RT-PCR (Flu A&B, Covid) Nasopharyngeal Swab     Status: None   Collection Time: 09/16/20  9:29 PM   Specimen: Nasopharyngeal Swab; Nasopharyngeal(NP) swabs in vial transport medium  Result Value Ref Range Status   SARS Coronavirus 2 by RT PCR NEGATIVE NEGATIVE Final    Comment: (NOTE) SARS-CoV-2 target nucleic acids are NOT DETECTED.  The SARS-CoV-2 RNA is generally detectable in upper respiratory specimens during the acute phase of infection. The lowest concentration of SARS-CoV-2  viral copies this assay can detect is 138 copies/mL. A negative result does not preclude SARS-Cov-2 infection and should not be used as the sole basis for treatment or other patient management decisions. A negative result may occur with  improper specimen collection/handling, submission of specimen other than nasopharyngeal swab, presence of viral mutation(s) within the areas targeted by this assay, and inadequate number of viral copies(<138 copies/mL). A negative result must be combined with clinical observations, patient history, and epidemiological information. The expected result is Negative.  Fact Sheet for Patients:  EntrepreneurPulse.com.au  Fact Sheet for Healthcare Providers:  IncredibleEmployment.be  This test is no t yet approved or cleared by the [place] and  has been authorized for  detection and/or diagnosis of SARS-CoV-2 by FDA under an Emergency Use Authorization (EUA). This EUA will remain  in effect (meaning this test can be used) for the duration of the COVID-19 declaration under Section 564(b)(1) of the Act, 21 U.S.C.section 360bbb-3(b)(1), unless the authorization is terminated  or revoked sooner.       Influenza A by PCR NEGATIVE NEGATIVE Final   Influenza B by PCR NEGATIVE NEGATIVE Final    Comment: (NOTE) The Xpert Xpress SARS-CoV-2/FLU/RSV plus assay is intended as an aid in the diagnosis of influenza from Nasopharyngeal swab specimens and should not be used as a sole basis for treatment. Nasal washings and aspirates are unacceptable for Xpert Xpress SARS-CoV-2/FLU/RSV testing.  Fact Sheet for Patients: EntrepreneurPulse.com.au  Fact Sheet for Healthcare Providers: IncredibleEmployment.be  This test is not yet approved or cleared by the [place] FDA and has been authorized for detection and/or diagnosis of SARS-CoV-2 by FDA under an Emergency Use Authorization  (EUA). This EUA will remain in effect (meaning this test can be used) for the duration of the COVID-19 declaration under Section 564(b)(1) of the Act, 21 U.S.C. section 360bbb-3(b)(1), unless the authorization is terminated or revoked.  Performed at [facility], [address] [place], [place] [address]      Labs: BNP (last 3 results) No results for input(s): BNP in the last 8760 hours. Basic Metabolic Panel: Recent Labs  Lab 09/16/20 1552 09/17/20 0345 09/18/20 0627  NA 136 139 144  K 5.0 4.2 4.2  CL 106 112* 109  CO2 21* 21* 26  GLUCOSE 218* 111* 94  BUN 71* 52* 25*  CREATININE 1.72* 1.22* 1.09*  CALCIUM 9.9 8.9 9.0  MG  --  1.7 1.3*  PHOS  --  3.0  --    Liver Function Tests: Recent Labs  Lab 09/16/20 1552 09/17/20 0345 09/18/20 0627  AST 52* 35 34  ALT 48* 35 34  ALKPHOS 43 33* 36*  BILITOT 0.5 0.3 0.6  PROT 7.0 5.8* 6.0*  ALBUMIN 3.7 3.1* 3.2*   Recent Labs  Lab 09/16/20 1552  LIPASE 62*   No results for input(s): AMMONIA in the last 168 hours. CBC: Recent Labs  Lab 09/16/20 1552 09/17/20 0345 09/18/20 0627  WBC 6.2 7.6 6.2  NEUTROABS 5.4  --   --   HGB 10.9* 9.8* 10.8*  HCT 32.8* 29.8* 33.9*  MCV 94.8 94.6 96.0  PLT 238 197 215   Cardiac Enzymes: No results for input(s): CKTOTAL, CKMB, CKMBINDEX, TROPONINI in the last 168 hours. BNP: Invalid input(s): POCBNP CBG: Recent Labs  Lab 09/17/20 1720 09/17/20 2355 09/18/20 0412 09/18/20 0739 09/18/20 1202  GLUCAP 113* 130* 88 103* 112*   D-Dimer Recent Labs    09/16/20 1552  DDIMER 0.39   Hgb A1c Recent Labs    09/16/20 1552  HGBA1C 6.5*   Lipid Profile No results for input(s): CHOL, HDL, LDLCALC, TRIG, CHOLHDL, LDLDIRECT in the last 72 hours. Thyroid function studies No results for input(s): TSH, T4TOTAL, T3FREE, THYROIDAB in the last 72 hours.  Invalid input(s): FREET3 Anemia work up Recent Labs    09/16/20 1552  FERRITIN 23  TIBC 437  IRON 48   Urinalysis     Component Value Date/Time   COLORURINE LT. YELLOW 01/27/2011 0935   APPEARANCEUR CLEAR 01/27/2011 0935   LABSPEC 1.015 01/27/2011 0935   PHURINE 6.0 01/27/2011 0935   GLUCOSEU NEGATIVE 01/27/2011 0935   HGBUR NEGATIVE 01/27/2011 0935   BILIRUBINUR Negative 05/26/2019 1336   KETONESUR  NEGATIVE 01/27/2011 0935   PROTEINUR Negative 05/26/2019 1336   PROTEINUR 30 (A) 12/16/2010 1810   UROBILINOGEN 0.2 05/26/2019 1336   UROBILINOGEN 0.2 01/27/2011 0935   NITRITE Negative 05/26/2019 1336   NITRITE NEGATIVE 01/27/2011 0935   LEUKOCYTESUR Trace (A) 05/26/2019 1336   Sepsis Labs Invalid input(s): PROCALCITONIN,  WBC,  LACTICIDVEN Microbiology Recent Results (from the past 240 hour(s))  Resp Panel by RT-PCR (Flu A&B, Covid) Nasopharyngeal Swab     Status: None   Collection Time: 09/16/20  9:29 PM   Specimen: Nasopharyngeal Swab; Nasopharyngeal(NP) swabs in vial transport medium  Result Value Ref Range Status   SARS Coronavirus 2 by RT PCR NEGATIVE NEGATIVE Final    Comment: (NOTE) SARS-CoV-2 target nucleic acids are NOT DETECTED.  The SARS-CoV-2 RNA is generally detectable in upper respiratory specimens during the acute phase of infection. The lowest concentration of SARS-CoV-2 viral copies this assay can detect is 138 copies/mL. A negative result does not preclude SARS-Cov-2 infection and should not be used as the sole basis for treatment or other patient management decisions. A negative result may occur with  improper specimen collection/handling, submission of specimen other than nasopharyngeal swab, presence of viral mutation(s) within the areas targeted by this assay, and inadequate number of viral copies(<138 copies/mL). A negative result must be combined with clinical observations, patient history, and epidemiological information. The expected result is Negative.  Fact Sheet for Patients:  EntrepreneurPulse.com.au  Fact Sheet for Healthcare Providers:   IncredibleEmployment.be  This test is no t yet approved or cleared by the [place] FDA and  has been authorized for detection and/or diagnosis of SARS-CoV-2 by FDA under an Emergency Use Authorization (EUA). This EUA will remain  in effect (meaning this test can be used) for the duration of the COVID-19 declaration under Section 564(b)(1) of the Act, 21 U.S.C.section 360bbb-3(b)(1), unless the authorization is terminated  or revoked sooner.       Influenza A by PCR NEGATIVE NEGATIVE Final   Influenza B by PCR NEGATIVE NEGATIVE Final    Comment: (NOTE) The Xpert Xpress SARS-CoV-2/FLU/RSV plus assay is intended as an aid in the diagnosis of influenza from Nasopharyngeal swab specimens and should not be used as a sole basis for treatment. Nasal washings and aspirates are unacceptable for Xpert Xpress SARS-CoV-2/FLU/RSV testing.  Fact Sheet for Patients: EntrepreneurPulse.com.au  Fact Sheet for Healthcare Providers: IncredibleEmployment.be  This test is not yet approved or cleared by the [place] FDA and has been authorized for detection and/or diagnosis of SARS-CoV-2 by FDA under an Emergency Use Authorization (EUA). This EUA will remain in effect (meaning this test can be used) for the duration of the COVID-19 declaration under Section 564(b)(1) of the Act, 21 U.S.C. section 360bbb-3(b)(1), unless the authorization is terminated or revoked.  Performed at [facility], [address] [place], [place] [address]      Time coordinating discharge: 35 minutes  SIGNED:   Rodena Goldmann, DO [facility] 09/18/2020, 2:39 PM  If 7PM-7AM, please contact night-coverage [email or web address]

## 2020-09-18 NOTE — Progress Notes (Signed)
Patient denies any pain, discomfort or trouble swallowing on the soft diet. Patient states she was able to cut her chicken into small pieces and eat it with a small amount of carrots and potatoes.

## 2020-09-21 ENCOUNTER — Telehealth: Payer: Self-pay

## 2020-09-21 ENCOUNTER — Other Ambulatory Visit: Payer: Self-pay

## 2020-09-21 ENCOUNTER — Emergency Department (HOSPITAL_COMMUNITY)
Admission: EM | Admit: 2020-09-21 | Discharge: 2020-09-21 | Disposition: A | Discharge status: Home / Self Care | Payer: Medicare PPO | Attending: Emergency Medicine | Admitting: Emergency Medicine

## 2020-09-21 ENCOUNTER — Telehealth: Payer: Self-pay | Admitting: *Deleted

## 2020-09-21 ENCOUNTER — Emergency Department (HOSPITAL_COMMUNITY): Payer: Medicare PPO

## 2020-09-21 ENCOUNTER — Encounter (HOSPITAL_COMMUNITY): Payer: Self-pay | Admitting: *Deleted

## 2020-09-21 DIAGNOSIS — R5383 Other fatigue: Secondary | ICD-10-CM

## 2020-09-21 DIAGNOSIS — Z7901 Long term (current) use of anticoagulants: Secondary | ICD-10-CM | POA: Insufficient documentation

## 2020-09-21 DIAGNOSIS — E119 Type 2 diabetes mellitus without complications: Secondary | ICD-10-CM | POA: Diagnosis not present

## 2020-09-21 DIAGNOSIS — R531 Weakness: Secondary | ICD-10-CM | POA: Diagnosis not present

## 2020-09-21 DIAGNOSIS — I10 Essential (primary) hypertension: Secondary | ICD-10-CM | POA: Diagnosis not present

## 2020-09-21 DIAGNOSIS — Z853 Personal history of malignant neoplasm of breast: Secondary | ICD-10-CM | POA: Insufficient documentation

## 2020-09-21 DIAGNOSIS — Z20822 Contact with and (suspected) exposure to covid-19: Secondary | ICD-10-CM | POA: Insufficient documentation

## 2020-09-21 DIAGNOSIS — E039 Hypothyroidism, unspecified: Secondary | ICD-10-CM | POA: Diagnosis not present

## 2020-09-21 DIAGNOSIS — R001 Bradycardia, unspecified: Secondary | ICD-10-CM | POA: Insufficient documentation

## 2020-09-21 DIAGNOSIS — Z79899 Other long term (current) drug therapy: Secondary | ICD-10-CM | POA: Insufficient documentation

## 2020-09-21 DIAGNOSIS — Z7984 Long term (current) use of oral hypoglycemic drugs: Secondary | ICD-10-CM | POA: Insufficient documentation

## 2020-09-21 LAB — CBC WITH DIFFERENTIAL/PLATELET
Abs Immature Granulocytes: 0.02 10*3/uL (ref 0.00–0.07)
Basophils Absolute: 0 10*3/uL (ref 0.0–0.1)
Basophils Relative: 0 %
Eosinophils Absolute: 0.2 10*3/uL (ref 0.0–0.5)
Eosinophils Relative: 3 %
HCT: 31.1 % — ABNORMAL LOW (ref 36.0–46.0)
Hemoglobin: 10.2 g/dL — ABNORMAL LOW (ref 12.0–15.0)
Immature Granulocytes: 0 %
Lymphocytes Relative: 30 %
Lymphs Abs: 1.8 10*3/uL (ref 0.7–4.0)
MCH: 31.7 pg (ref 26.0–34.0)
MCHC: 32.8 g/dL (ref 30.0–36.0)
MCV: 96.6 fL (ref 80.0–100.0)
Monocytes Absolute: 0.6 10*3/uL (ref 0.1–1.0)
Monocytes Relative: 10 %
Neutro Abs: 3.4 10*3/uL (ref 1.7–7.7)
Neutrophils Relative %: 57 %
Platelets: 203 10*3/uL (ref 150–400)
RBC: 3.22 MIL/uL — ABNORMAL LOW (ref 3.87–5.11)
RDW: 13 % (ref 11.5–15.5)
WBC: 6 10*3/uL (ref 4.0–10.5)
nRBC: 0 % (ref 0.0–0.2)

## 2020-09-21 LAB — TROPONIN I (HIGH SENSITIVITY): Troponin I (High Sensitivity): 3 ng/L (ref <18)

## 2020-09-21 LAB — COMPREHENSIVE METABOLIC PANEL
ALT: 36 U/L (ref 0–44)
AST: 38 U/L (ref 15–41)
Albumin: 3.5 g/dL (ref 3.5–5.0)
Alkaline Phosphatase: 36 U/L — ABNORMAL LOW (ref 38–126)
Anion gap: 7 (ref 5–15)
BUN: 36 mg/dL — ABNORMAL HIGH (ref 8–23)
CO2: 22 mmol/L (ref 22–32)
Calcium: 8.7 mg/dL — ABNORMAL LOW (ref 8.9–10.3)
Chloride: 108 mmol/L (ref 98–111)
Creatinine, Ser: 1.55 mg/dL — ABNORMAL HIGH (ref 0.44–1.00)
GFR, Estimated: 34 mL/min — ABNORMAL LOW (ref >60)
Glucose, Bld: 97 mg/dL (ref 70–99)
Potassium: 4.6 mmol/L (ref 3.5–5.1)
Sodium: 137 mmol/L (ref 135–145)
Total Bilirubin: 0.6 mg/dL (ref 0.3–1.2)
Total Protein: 6.4 g/dL — ABNORMAL LOW (ref 6.5–8.1)

## 2020-09-21 LAB — PROTIME-INR
INR: 2 — ABNORMAL HIGH (ref 0.8–1.2)
Prothrombin Time: 22.8 seconds — ABNORMAL HIGH (ref 11.4–15.2)

## 2020-09-21 LAB — RESP PANEL BY RT-PCR (FLU A&B, COVID) ARPGX2
Influenza A by PCR: NEGATIVE
Influenza B by PCR: NEGATIVE
SARS Coronavirus 2 by RT PCR: NEGATIVE

## 2020-09-21 MED ORDER — LACTATED RINGERS IV BOLUS: 1000.0000 mL | Freq: Once | INTRAVENOUS | Status: DC

## 2020-09-21 MED ORDER — LACTATED RINGERS IV BOLUS
500.0000 mL | Freq: Once | INTRAVENOUS | Status: AC
  Administered 2020-09-21: 500 mL via INTRAVENOUS

## 2020-09-21 NOTE — ED Provider Notes (Signed)
[facility] EMERGENCY DEPARTMENT Provider Note   CSN: [id number] Arrival date & time: 09/21/20  1716     History Chief Complaint  Patient presents with   Generalized Body Aches    Andrea Simmons is a 80 y.o. female.  HPI Patient is a 80 year old female with past medical history significant for DM2, myalgia, reflux, HLD, HTN, anxiety, anemia, depression, pulmonary embolisms on chronic anticoagulation in the form of Coumadin.  Patient states that she woke up this morning feeling achy all over and states that she has had some tingling in both her hands and feet.  She denies any numbness or weakness no slurred speech or confusion.  She denies any lightheadedness or dizziness.  Denies any fevers or chills cough or congestion.  She states that she is primarily concerned about her overall fatigue and also states that she has poor appetite and that when she eats it tastes abnormal and not to her liking.  She states she has no difficulty swallowing or dysphagia and has been eating normally.  She states that she does not eat much however.     Past Medical History:  Diagnosis Date   Anemia    Anxiety    Breast cancer ([place])    Depression    Diabetes mellitus type II    Fibromyalgia    GERD (gastroesophageal reflux disease)    Hyperlipidemia    Hypertension    Malignant neoplasm of breast (female), unspecified site 1993   L breast s/p mastectomy and tamoxifen x 64yr   Other pulmonary embolism and infarction 2008 and 2009   chronic anticoag - [facility]   Unspecified hypothyroidism     Patient Active Problem List   Diagnosis Date Noted   Esophageal dysphagia 09/16/2020   Dehydration 09/16/2020   AKI (acute kidney injury) ([place] 09/16/2020   Elevated lipase 09/16/2020   Hyperglycemia due to diabetes mellitus ([place] 09/16/2020   Vitamin D deficiency 10/01/2019   Polypharmacy 10/01/2019   Major depressive disorder, recurrent episode, moderate ([place] 10/01/2019   Leg edema 06/04/2019    Chest pain of uncertain etiology 135/59/7416  Hx of pulmonary embolus 03/30/2017   Physical exam 11/30/2016   Hyperlipidemia 12/26/2013   Encounter for therapeutic drug monitoring 04/16/2013   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   Long term (current) use of anticoagulants 06/30/2010   Exertional dyspnea 11/09/2008   Diabetes type 2, controlled ([place] 08/27/2008   ANEMIA-NOS 08/27/2008   Seasonal and perennial allergic rhinitis 08/27/2008   PULMONARY NODULE 08/27/2008   Fibromyalgia 08/27/2008   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 ([place] 03/20/2007   Hypothyroidism 03/20/2007   Essential hypertension 03/20/2007   Recurrent pulmonary emboli ([place] 03/20/2007   GERD 03/20/2007   ESOPHAGEAL STRICTURE 03/08/2007    Past Surgical History:  Procedure Laterality Date   APPENDECTOMY     BREAST BIOPSY Left 1993   BREAST BIOPSY Right 09/25/2014   stero. Benign   BREAST RECONSTRUCTION Left    CATARACT EXTRACTION     MASTECTOMY Left    ROTATOR CUFF REPAIR     SPINAL FUSION      x 2   TOTAL ABDOMINAL HYSTERECTOMY W/ BILATERAL SALPINGOOPHORECTOMY     TUBAL LIGATION       OB History   No obstetric history on file.     Family History  Problem Relation Age of Onset   Depression Other        Parent   Arthritis Other  Parent, Grandparent   Hypertension Other        Grandparent   Hyperlipidemia Other        [place]   Miscarriages / Stillbirths Other        Grandparent   Stroke Other        St[place]   Cancer Maternal Uncle        prostate   Hypertension Maternal Grandfather    Breast cancer Cousin    Breast cancer Cousin     Social History   Tobacco Use   Smoking status: Never   Smokeless tobacco: Never  Vaping Use   Vaping Use: Never used  Substance Use Topics   Alcohol use: No    Alcohol/week: 0.0 standard drinks   Drug use: No    Home Medications Prior to Admission medications   Medication Sig Start Date End Date Taking?  Authorizing Provider  amLODipine (NORVASC) 10 MG tablet TAKE 1 TABLET BY MOUTH EVERY DAY 05/17/20  Yes [name], [name], MD  Colchicine (MITIGARE) 0.6 MG CAPS Take 1 capsule by mouth 2 (two) times daily as needed. 09/16/20  Yes [name], Legrand Como, MD  DULoxetine (CYMBALTA) 30 MG capsule Take 3 capsules (90 mg total) by mouth daily. 08/12/20  Yes [name], Billey Co., MD  furosemide (LASIX) 20 MG tablet TAKE 1 TABLET BY MOUTH AS NEEDED 08/27/19  Yes [name], [name], MD  labetalol (NORMODYNE) 100 MG tablet Take 1 tablet (100 mg total) by mouth 2 (two) times daily. 09/13/20  Yes [name], [name], MD  levothyroxine (SYNTHROID) 25 MCG tablet TAKE 1 TABLET BY MOUTH EVERY DAY BEFORE BREAKFAST Patient taking differently: Take 25 mcg by mouth daily before breakfast. 04/28/20  Yes [name], MD  Magnesium 500 MG TABS Take 1 tablet by mouth daily.   Yes [provider]  metFORMIN (GLUCOPHAGE-XR) 500 MG 24 hr tablet Take 500 mg by mouth daily with breakfast. 03/23/16  Yes [provider]  mirtazapine (REMERON) 7.5 MG tablet Take 1 tablet (7.5 mg total) by mouth at bedtime. 08/12/20  Yes [name], Billey Co., MD  nitroGLYCERIN (NITROSTAT) 0.4 MG SL tablet Place 1 tablet (0.4 mg total) under the tongue every 5 (five) minutes as needed for chest pain. 10/31/18 03/03/21 Yes [name], [name], MD  pantoprazole (PROTONIX) 40 MG tablet Take 1 tablet (40 mg total) by mouth 2 (two) times daily before a meal. 09/18/20 10/18/20 Yes [name], [name], DO  RESTASIS MULTIDOSE 0.05 % ophthalmic emulsion Place 1 drop into both eyes 2 (two) times daily.  06/21/16  Yes [provider]  rosuvastatin (CRESTOR) 20 MG tablet Take 20 mg by mouth daily.   Yes [provider]  valsartan (DIOVAN) 320 MG tablet Take 1 tablet (320 mg total) by mouth daily. 09/13/20  Yes [name], [name], MD  Vitamin D, Ergocalciferol, (DRISDOL) 1.25 MG (50000 UNIT) CAPS capsule TAKE 1 CAPSULE BY MOUTH ONE TIME PER  WEEK Patient taking differently: Take 50,000 Units by mouth every 7 (seven) days. 06/17/20  Yes [name], Billey Co., MD  warfarin (COUMADIN) 5 MG tablet 42m on Sunday, 2.514mAll other days Patient taking differently: Take 5 mg by mouth See admin instructions. 74m774mn Sunday,Tuesday and Wednesday then take  2.74mg27ml other days 02/06/20  Yes [name]  Accu-Chek Softclix Lancets lancets USE AS INSTRUCTED TO TEST SUGARS TWICE A DAY 08/18/20   [name]  BIOTIN PO Take 1 tablet by mouth 2 (two) times  daily. Lolita Cram & lum - Sheen Patient not taking: Reported on 09/21/2020    [provider]  Blood Glucose Monitoring Suppl (ACCU-CHEK GUIDE) w/Device KIT 1 each by Does not apply route 2 (two) times daily. To test sugars. Dx. E11.9 06/23/19   [name], MD  fenofibrate (TRICOR) 48 MG tablet Take 48 mg by mouth daily. Patient not taking: Reported on 09/21/2020    [provider]  glucose blood (ACCU-CHEK GUIDE) test strip CHECK BLOOD SUGARS DAILY 08/20/19   [name], MD  HYDROcodone-acetaminophen (NORCO/VICODIN) 5-325 MG tablet Take 1 tablet by mouth every 6 (six) hours as needed. Patient not taking: Reported on 09/21/2020 09/12/20   Rayna Sexton, PA-C  predniSONE (DELTASONE) 10 MG tablet Take 10 mg by mouth daily with breakfast. Patient not taking: Reported on 09/21/2020    [provider]    Allergies    Anaprox [naproxen sodium], Lipitor [atorvastatin], Morphine, Meperidine, and Naproxen sodium  Review of Systems   Review of Systems  Constitutional:  Positive for fatigue. Negative for chills and fever.  HENT:  Negative for congestion.   Eyes:  Negative for pain.  Respiratory:  Negative for cough and shortness of breath.   Cardiovascular:  Negative for chest pain and leg swelling.  Gastrointestinal:  Negative for abdominal pain, diarrhea, nausea and vomiting.  Genitourinary:  Negative for dysuria.  Musculoskeletal:  Negative for  myalgias.  Skin:  Negative for rash.  Neurological:  Negative for dizziness, numbness and headaches.       Paresthesias of hands and feet   Physical Exam Updated Vital Signs BP 121/65 (BP Location: Right Arm)   Pulse (!) 57   Temp 98.4 F (36.9 C) (Oral)   Resp 19   SpO2 93%   Physical Exam Vitals and nursing note reviewed.  Constitutional:      General: She is not in acute distress.    Comments: Pleasant well-appearing 80 year old.  In no acute distress.  Sitting comfortably in bed.  Able answer questions appropriately follow commands. No increased work of breathing. Speaking in full sentences.   HENT:     Head: Normocephalic and atraumatic.     Nose: Nose normal.  Eyes:     General: No scleral icterus. Cardiovascular:     Rate and Rhythm: Normal rate and regular rhythm.     Pulses: Normal pulses.     Heart sounds: Normal heart sounds.  Pulmonary:     Effort: Pulmonary effort is normal. No respiratory distress.     Breath sounds: No wheezing.  Abdominal:     Palpations: Abdomen is soft.     Tenderness: There is no abdominal tenderness.     Comments: Abdomen is soft nontender.  No guarding or rebound.  Musculoskeletal:     Cervical back: Normal range of motion.     Right lower leg: No edema.     Left lower leg: No edema.  Skin:    General: Skin is warm and dry.     Capillary Refill: Capillary refill takes less than 2 seconds.  Neurological:     Mental Status: She is alert. Mental status is at baseline.     Comments: Moves all 4 extremities.  Has good strength in all 4 extremities.  Sensation intact in all 4 extremities in all fingertips and on the plantar and dorsal aspect of both feet.  Ambulatory without difficulty.  Mentating well ANO x3.  Psychiatric:        Mood and Affect: Mood  normal.        Behavior: Behavior normal.    ED Results / Procedures / Treatments   Labs (all labs ordered are listed, but only abnormal results are displayed) Labs Reviewed   COMPREHENSIVE METABOLIC PANEL - Abnormal; Notable for the following components:      Result Value   BUN 36 (*)    Creatinine, Ser 1.55 (*)    Calcium 8.7 (*)    Total Protein 6.4 (*)    Alkaline Phosphatase 36 (*)    GFR, Estimated 34 (*)    All other components within normal limits  PROTIME-INR - Abnormal; Notable for the following components:   Prothrombin Time 22.8 (*)    INR 2.0 (*)    All other components within normal limits  CBC WITH DIFFERENTIAL/PLATELET - Abnormal; Notable for the following components:   RBC 3.22 (*)    Hemoglobin 10.2 (*)    HCT 31.1 (*)    All other components within normal limits  RESP PANEL BY RT-PCR (FLU A&B, COVID) ARPGX2  TROPONIN I (HIGH SENSITIVITY)    EKG EKG Interpretation  Date/Time:  Tuesday September 21 2020 19:10:06 EDT Ventricular Rate:  49 PR Interval:  173 QRS Duration: 167 QT Interval:  496 QTC Calculation: 448 R Axis:   -56 Text Interpretation: Sinus bradycardia RBBB and LAFB since last tracing no significant change Confirmed by Noemi Chapel [phone]) on 09/21/2020 7:24:48 PM  Radiology DG Chest Port 1 View  Result Date: 09/21/2020 CLINICAL DATA:  Weakness, fatigue EXAM: PORTABLE CHEST 1 VIEW COMPARISON:  Radiograph 09/16/2020, CT 09/01/2008 FINDINGS: Unchanged cardiomediastinal silhouette. There is no new focal airspace consolidation. There is no large pleural effusion or visible pneumothorax. Multiple surgical clips noted and cervical spine cerclage wire noted. No acute osseous abnormality. IMPRESSION: No new focal airspace consolidation. Electronically Signed   By: Maurine Simmering   On: 09/21/2020 18:39    Procedures Procedures   Medications Ordered in ED Medications  lactated ringers bolus 500 mL (500 mLs Intravenous New Bag/Given 09/21/20 2000)    ED Course  I have reviewed the triage vital signs and the nursing notes.  Pertinent labs & imaging results that were available during my care of the patient were reviewed by me and  considered in my medical decision making (see chart for details).  Clinical Course as of 09/21/20 2345  Tue Sep 21, 2020  1847 Hemoglobin is at baseline for patient.  INR is 2 (has been between 2-2.5 historically) [WF]  1847 Chest x-ray unremarkable. [WF]    Clinical Course User Index [WF] Tedd Sias, PA   MDM Rules/Calculators/A&P                          Patient is a 80 year old female with past medical history detailed in HPI.  She is presented today for fatigue.  She was admitted to the hospital and discharged 3 days ago for complaints of dysphagia with an AKI.  He has no complaints of dysphagia at this time.  Obtained basic lab work to evaluate for AKI or resolution of such.  Her INR is within normal limits for her goal which is 2-3.  Troponin within normal limits.  CMP unremarkable apart from her creatinine is 1.55 which is very mildly elevated from her discharge.  She will need to have this rechecked with her PCP within the week.  CBC with stable anemia.  Chest x-ray unremarkable no infiltrate.  EKG with no ST-T  wave abnormalities of note.  Normal sinus rhythm.  I discussed this case with my attending physician who cosigned this note including patient's presenting symptoms, physical exam, and planned diagnostics and interventions. Attending physician stated agreement with plan or made changes to plan which were implemented.   Discussed results with patient and she is understanding of need for close follow-up.  Will discharge home at this time.  Final Clinical Impression(s) / ED Diagnoses Final diagnoses:  Fatigue, unspecified type    Rx / DC Orders ED Discharge Orders     None        Tedd Sias, [place] 09/21/20 2349    Noemi Chapel, MD 09/23/20 1455

## 2020-09-21 NOTE — ED Triage Notes (Signed)
States she is aching all over and has tingling in both hands and feet

## 2020-09-21 NOTE — Discharge Instructions (Addendum)
Please follow-up with your primary care provider within the next 7 days and have your labs rechecked.  Your kidney function is better than it was when you were admitted to the hospital however it needs to be closely monitored.  Otherwise her work-up is reassuring.  Please return to the ER for any new or concerning symptoms.  Please drink plenty of water.  Please continue to eat 3 meals per day this is very important you may use boost or Ensure or other product to maintain adequate protein in your diet.

## 2020-09-21 NOTE — Telephone Encounter (Signed)
Noted   Attempted to fax cardiac clearance to [facility] x 2 and didn't go through. Will attempt again in the morning.

## 2020-09-21 NOTE — Telephone Encounter (Signed)
Called pt, EGD/DIL scheduled for 10/18/20 at 11:00am. Pt aware our office will reach out to cardiology re: Lovenox bridge. Orders entered. PA submitted via [employer]. [employer]# [id number], valid 10/18/20-11/17/20.  D[place], Dr. Abbey Chatters requests for whomever is managing her Coumadin to handle Lovenox bridge.

## 2020-09-21 NOTE — Telephone Encounter (Signed)
Our office received a clearance request though I do not see that [place] follows this pt. Looks to be that primary cardiologist is Dr. Vernell Leep with [facility]. I will fax the clearance note back to the requesting office, pt not followed by [place].

## 2020-09-21 NOTE — Telephone Encounter (Signed)
-----   Message from Eloise Harman, DO sent at 09/18/2020  2:36 PM EDT ----- Please arrange EGD with dilation for this patient. ASA 3, needs lovenox bridging. Would reach out to whomever is managing her Coumadin for this. Thanks!

## 2020-09-22 NOTE — Telephone Encounter (Signed)
Faxed clearance letter to [facility] for this pt regarding her Lovenox bridge. Fax went through.

## 2020-09-23 ENCOUNTER — Ambulatory Visit: Payer: Medicare PPO | Admitting: Family Medicine

## 2020-09-23 ENCOUNTER — Encounter: Payer: Self-pay | Admitting: Family Medicine

## 2020-09-23 ENCOUNTER — Telehealth: Payer: Self-pay

## 2020-09-23 ENCOUNTER — Other Ambulatory Visit: Payer: Self-pay

## 2020-09-23 VITALS — BP 105/70 | HR 70 | Temp 98.0°F | Resp 16 | Ht 64.0 in | Wt 155.0 lb

## 2020-09-23 DIAGNOSIS — R1319 Other dysphagia: Secondary | ICD-10-CM

## 2020-09-23 DIAGNOSIS — D649 Anemia, unspecified: Secondary | ICD-10-CM

## 2020-09-23 DIAGNOSIS — N179 Acute kidney failure, unspecified: Secondary | ICD-10-CM

## 2020-09-23 DIAGNOSIS — E039 Hypothyroidism, unspecified: Secondary | ICD-10-CM | POA: Diagnosis not present

## 2020-09-23 LAB — BASIC METABOLIC PANEL
BUN: 24 mg/dL — ABNORMAL HIGH (ref 6–23)
CO2: 22 mEq/L (ref 19–32)
Calcium: 9.4 mg/dL (ref 8.4–10.5)
Chloride: 108 mEq/L (ref 96–112)
Creatinine, Ser: 1.36 mg/dL — ABNORMAL HIGH (ref 0.40–1.20)
GFR: 37.33 mL/min — ABNORMAL LOW (ref >60.00)
Glucose, Bld: 92 mg/dL (ref 70–99)
Potassium: 4.8 mEq/L (ref 3.5–5.1)
Sodium: 140 mEq/L (ref 135–145)

## 2020-09-23 LAB — HEPATIC FUNCTION PANEL
ALT: 27 U/L (ref 0–35)
AST: 31 U/L (ref 0–37)
Albumin: 4.2 g/dL (ref 3.5–5.2)
Alkaline Phosphatase: 40 U/L (ref 39–117)
Bilirubin, Direct: 0.1 mg/dL (ref 0.0–0.3)
Total Bilirubin: 0.4 mg/dL (ref 0.2–1.2)
Total Protein: 6.4 g/dL (ref 6.0–8.3)

## 2020-09-23 LAB — CBC WITH DIFFERENTIAL/PLATELET
Basophils Absolute: 0 10*3/uL (ref 0.0–0.1)
Basophils Relative: 0.7 % (ref 0.0–3.0)
Eosinophils Absolute: 0.1 10*3/uL (ref 0.0–0.7)
Eosinophils Relative: 2.2 % (ref 0.0–5.0)
HCT: 31.2 % — ABNORMAL LOW (ref 36.0–46.0)
Hemoglobin: 10.7 g/dL — ABNORMAL LOW (ref 12.0–15.0)
Lymphocytes Relative: 27.8 % (ref 12.0–46.0)
Lymphs Abs: 1.8 10*3/uL (ref 0.7–4.0)
MCHC: 34.3 g/dL (ref 30.0–36.0)
MCV: 91.9 fl (ref 78.0–100.0)
Monocytes Absolute: 0.7 10*3/uL (ref 0.1–1.0)
Monocytes Relative: 10.7 % (ref 3.0–12.0)
Neutro Abs: 3.9 10*3/uL (ref 1.4–7.7)
Neutrophils Relative %: 58.6 % (ref 43.0–77.0)
Platelets: 201 10*3/uL (ref 150.0–400.0)
RBC: 3.39 Mil/uL — ABNORMAL LOW (ref 3.87–5.11)
RDW: 13.2 % (ref 11.5–15.5)
WBC: 6.6 10*3/uL (ref 4.0–10.5)

## 2020-09-23 LAB — TSH: TSH: 2.19 u[IU]/mL (ref 0.35–5.50)

## 2020-09-23 NOTE — Progress Notes (Signed)
error 

## 2020-09-23 NOTE — Progress Notes (Signed)
   Subjective:    Patient ID: Andrea Simmons, female    DOB: 15-Jan-1941, 80 y.o.   MRN: [id number]  [facility] f/u- pt was admitted 6/30-7/2 due to progressively worsening trouble w/ swallowing.  B/c she was on coumadin, her EGD was postponed and she was able to tolerate a soft diet.  She was d/c'd w/ outpt GI f/u and PPI BID.  She then went to ER on 7/5 c/o body aches and tingling in hands/feet.  She was negative for COVID and flu.  Cr was mildly elevated at 1.55 but other labs were stable, although Hgb 10.2.  Today, pt reports swallowing is better but she continues to feel 'lousy'.  'i just feel so tired'.  Pt reports she is not sleeping well.  Pt reports she is drinking adequate fluids.  No CP, SOB above baseline.   Review of Systems For ROS see HPI   This visit occurred during the SARS-CoV-2 public health emergency.  Safety protocols were in place, including screening questions prior to the visit, additional usage of staff PPE, and extensive cleaning of exam room while observing appropriate contact time as indicated for disinfecting solutions.      Objective:   Physical Exam Vitals reviewed.  Constitutional:      General: She is not in acute distress.    Appearance: She is not ill-appearing (appears tired today).  HENT:     Head: Normocephalic and atraumatic.  Cardiovascular:     Rate and Rhythm: Normal rate and regular rhythm.  Pulmonary:     Effort: Pulmonary effort is normal. No respiratory distress.     Breath sounds: Normal breath sounds.  Abdominal:     General: Abdomen is flat. There is no distension.     Palpations: Abdomen is soft.     Tenderness: There is no abdominal tenderness.  Neurological:     Mental Status: She is alert and oriented to person, place, and time. Mental status is at baseline.  Psychiatric:     Comments: Sad, withdrawn          Assessment & Plan:

## 2020-09-23 NOTE — Telephone Encounter (Signed)
Sent a letter to Dr. Nigel Mormon for clearance to have a EGD/DIL and the pt will need a Lovenox bridge because she is on Coumadin. (This is the correct Dr the pt is seeing).

## 2020-09-23 NOTE — Patient Instructions (Addendum)
Please schedule your complete physical for November We'll notify you of your lab results and make any changes if needed Allow yourself time to recover from your recent hospitalization Try and rest! Continue to drink plenty of fluids Call with any questions or concerns Hang in there!

## 2020-09-24 ENCOUNTER — Other Ambulatory Visit: Payer: Self-pay

## 2020-09-24 ENCOUNTER — Telehealth: Payer: Self-pay

## 2020-09-24 DIAGNOSIS — R1319 Other dysphagia: Secondary | ICD-10-CM

## 2020-09-24 DIAGNOSIS — Z7901 Long term (current) use of anticoagulants: Secondary | ICD-10-CM

## 2020-09-24 NOTE — Telephone Encounter (Signed)
-----   Message from Eloise Harman, DO sent at 09/24/2020  9:50 AM EDT ----- Regarding: FW: Coumadin-lovenox Please disregard previous message. Dr. Birdie Riddle already responded. Can we get patient to see Dr. Irene Limbo? Thanks  ----- Message ----- From: [name], MD Sent: 09/24/2020   9:06 AM EDT To: Nigel Mormon, MD, # Subject: RE: Coumadin-lovenox                           Given that the last time she stopped her Coumadin for a procedure she developed a repeat PE, I would prefer she see Dr Irene Limbo again (last seen 2018) prior to changing anticoagulation regimen.  He would best know how to get her through this transition with the least amount of risk.  Respectfully, Dimple Nanas ----- Message ----- From: Nigel Mormon, MD Sent: 09/24/2020   9:00 AM EDT To: [name], MD, Eloise Harman, DO Subject: Coumadin-lovenox                               Dear Dr. Jerline Pain,  Received a letter regarding management of Coumadin-Lovenox.-Around the time of patient's EGD.  Patient has been on Coumadin for recurrent DVT. Since this is managed by her PCP Dr. Virgil Benedict office, I will defer it to Dr. Virgil Benedict office.  Regards, Dr. Virgina Jock

## 2020-09-24 NOTE — Progress Notes (Signed)
am

## 2020-09-24 NOTE — Telephone Encounter (Signed)
Urgent referral sent to Dr. Irene Limbo via [employer].   Called and informed pt of recommendation from Dr. Kandis Mannan. Andrea Simmons. Pt aware referral was sent to Dr. Irene Limbo.

## 2020-09-27 ENCOUNTER — Telehealth: Payer: Self-pay | Admitting: Hematology

## 2020-09-27 NOTE — Telephone Encounter (Signed)
Received a new hem reerralf rom Dr. Abbey Simmons for chronic anticoagulation. [name] has been cld and scheduled to see [name] on 7/25 at 1pm.

## 2020-09-28 NOTE — Progress Notes (Signed)
Tried calling twice. Recording states call cannot be completed.

## 2020-09-30 ENCOUNTER — Ambulatory Visit: Payer: Medicare PPO | Admitting: Psychiatry

## 2020-09-30 ENCOUNTER — Telehealth: Payer: Self-pay

## 2020-09-30 NOTE — Telephone Encounter (Signed)
Patient returned call about lab results. Patient stated that she was not receiving good signal at her home and that was the reason I could not get through. Patient voiced understanding about her labs. No concerns at this time.

## 2020-10-04 ENCOUNTER — Ambulatory Visit: Payer: Medicare PPO

## 2020-10-06 ENCOUNTER — Ambulatory Visit (INDEPENDENT_AMBULATORY_CARE_PROVIDER_SITE_OTHER): Payer: Medicare PPO | Admitting: General Practice

## 2020-10-06 ENCOUNTER — Other Ambulatory Visit: Payer: Self-pay

## 2020-10-06 DIAGNOSIS — I2699 Other pulmonary embolism without acute cor pulmonale: Secondary | ICD-10-CM

## 2020-10-06 DIAGNOSIS — Z7901 Long term (current) use of anticoagulants: Secondary | ICD-10-CM

## 2020-10-06 LAB — POCT INR: INR: 3 (ref 2.0–3.0)

## 2020-10-06 NOTE — Patient Instructions (Signed)
Pre visit review using our clinic review tool, if applicable. No additional management support is needed unless otherwise documented below in the visit note.  Please change dosage and take 1/2 tablet all days except 1 tablet on Tuesdays and Saturdays.  Re-check in 4 weeks.  Call [place], RN @ [phone] about holding warfarin for endoscopy.

## 2020-10-07 ENCOUNTER — Ambulatory Visit: Payer: Medicare PPO

## 2020-10-07 NOTE — Progress Notes (Signed)
I have reviewed and agree with this plan   Tu Bayle, NP  

## 2020-10-09 NOTE — Progress Notes (Signed)
Marland Kitchen    HEMATOLOGY/ONCOLOGY CONSULTATION NOTE  Date of Service: 10/11/2020   Patient Care Team: [name], MD as PCP - General (Family Medicine) Irene Shipper, MD as Consulting Physician (Gastroenterology) Deneise Lever, MD as Consulting Physician (Pulmonary Disease) Jacelyn Pi, MD as Consulting Physician (Endocrinology) Valinda Party, MD as Consulting Physician (Rheumatology) [name], Billey Co., MD as Attending Physician (Psychiatry) Renie Ora, MD as Referring Physician (Anesthesiology) Brunetta Genera, MD as Consulting Physician (Hematology)  CHIEF COMPLAINTS/PURPOSE OF CONSULTATION:  Hypercoagulable state with recurrent Pulmonary Embolism on indefinite anticoagulation   HISTORY OF PRESENTING ILLNESS:   Andrea Simmons is a wonderful 80 y.o. female who has been referred to [place] by Dr .Birdie Riddle, Aundra Millet, MD  for evaluation and recommendations regarding anticoagulation in the context of possible elective blepharoplasty/eye lift surgery.  Patient is a history of hypertension, diabetes, fibromyalgia, remote history of breast cancer in 1993, hypothyroidism who had a pulmonary embolism in 2008 while in [place] after long distance driving to [place] from [place]. She had presented with shortness of breath and dyspnea on exertion. She was on anticoagulation for 5-1/2 months and his anticoagulation was held for EGD and colonoscopy. Patient had recurrent bilateral pulmonary embolisms off Coumadin and was recommended indefinite anticoagulation for her unprovoked bilateral pulmonary emboli. Patient has been on Coumadin since 2009.  She notes no overt bleeding. She notes that she is also on baby aspirin in addition to Coumadin, takes Celebrex for her osteoarthritis/fibromyalgia and is also on Cymbalta. We noted that this combination of medications consistently increase her risk of GI bleeding.  Her recent labs with her primary care physician on  04/20/2016 showed that her hemoglobin was down to 9.1 with MCV of 79 and elevated RDW of 15.6 with the normal WBC count and platelet count. This was concerning for possible slow GI bleeding with iron deficiency related to her anticoagulation and other medications. Patient notes no overt melena or hematochezia or hematemesis. No hematuria or other overt signs of bleeding. She has been taking an over-the-counter iron supplement but does not know which one. Has been on chronic PPI therapy.  She notes that her upper eyelids have somewhat lax skin which she has seen her eye doctor for and is contemplating possible eyelid/eye lift surgery. She is not decided that she would go for surgery.  No fevers no chills no night sweats no unexpected weight loss no abdominal pain. No issues with stability of anticoagulation with Coumadin.   INTERVAL HISTORY  Andrea Simmons was last seen in 2018.  She has a history of recurrent pulmonary embolism and has been recommended to be on chronic anticoagulation with Coumadin.  She continues to follow with her primary care physician for Coumadin monitoring. She notes that the second out of her 2 PEs occurred when her anticoagulation was previously held more than 10 years ago for an EGD.  At the time she did not have any bridging anticoagulation. She is scheduled for an endoscopy on 10/18/2020 and she was referred back to [place] for recommendations regarding anticoagulation management. Patient notes no issues with bleeding on Coumadin. No new VTE events while on Coumadin. No other acute new symptoms.   MEDICAL HISTORY:  Past Medical History:  Diagnosis Date   Anemia    Anxiety    Breast cancer ([place])    Depression    Diabetes mellitus type II    Fibromyalgia    GERD (gastroesophageal reflux disease)    Hyperlipidemia  Hypertension    Malignant neoplasm of breast (female), unspecified site 1993   L breast s/p mastectomy and tamoxifen x 14yr   Other pulmonary  embolism and infarction 2008 and 2009   chronic anticoag - [facility]   Unspecified hypothyroidism     SURGICAL HISTORY: Past Surgical History:  Procedure Laterality Date   APPENDECTOMY     BREAST BIOPSY Left 1993   BREAST BIOPSY Right 09/25/2014   stero. Benign   BREAST RECONSTRUCTION Left    CATARACT EXTRACTION     MASTECTOMY Left    ROTATOR CUFF REPAIR     SPINAL FUSION      x 2   TOTAL ABDOMINAL HYSTERECTOMY W/ BILATERAL SALPINGOOPHORECTOMY     TUBAL LIGATION      SOCIAL HISTORY: Social History   Socioeconomic History   Marital status: Married    Spouse name: Not on file   Number of children: 1   Years of education: Not on file   Highest education level: Not on file  Occupational History   Occupation: tArmed forces technical officer RETIRED   Occupation: hEmergency planning/management officer  Occupation: school bus driver  Tobacco Use   Smoking status: Never   Smokeless tobacco: Never  VScientific laboratory technicianUse: Never used  Substance and Sexual Activity   Alcohol use: No    Alcohol/week: 0.0 standard drinks   Drug use: No   Sexual activity: Not on file  Other Topics Concern   Not on file  Social History Narrative   Not on file   Social Determinants of Health   Financial Resource Strain: Low Risk    Difficulty of Paying Living Expenses: Not hard at all  Food Insecurity: No Food Insecurity   Worried About RCharity fundraiserin the Last Year: Never true   RLincolnin the Last Year: Never true  Transportation Needs: No Transportation Needs   Lack of Transportation (Medical): No   Lack of Transportation (Non-Medical): No  Physical Activity: Inactive   Days of Exercise per Week: 0 days   Minutes of Exercise per Session: 0 min  Stress: No Stress Concern Present   Feeling of Stress : Only a little  Social Connections: Moderately Integrated   Frequency of Communication with Friends and Family: More than three times a week   Frequency of Social Gatherings with Friends and  Family: Once a week   Attends Religious Services: 1 to 4 times per year   Active Member of [employer] Organizations: No   Attends CMusic therapist Never   Marital Status: Married  IHuman resources officerViolence: Not At Risk   Fear of Current or Ex-Partner: No   Emotionally Abused: No   Physically Abused: No   Sexually Abused: No    FAMILY HISTORY: Family History  Problem Relation Age of Onset   Depression Other        Parent   Arthritis Other        Parent, Grandparent   Hypertension Other        Grandparent   Hyperlipidemia Other        [place]  Miscarriages / Stillbirths Other        Grandparent   Stroke Other        [place]  Cancer Maternal Uncle        prostate   Hypertension Maternal Grandfather    Breast cancer Cousin    Breast cancer Cousin  ALLERGIES:  is allergic to lipitor [atorvastatin], morphine, meperidine, and naproxen sodium.  MEDICATIONS:  Current Outpatient Medications  Medication Sig Dispense Refill   enoxaparin (LOVENOX) 60 MG/0.6ML injection Inject 0.6 mLs (60 mg total) into the skin daily. 12 mL 0   Accu-Chek Softclix Lancets lancets USE AS INSTRUCTED TO TEST SUGARS TWICE A DAY 100 each 12   amLODipine (NORVASC) 10 MG tablet TAKE 1 TABLET BY MOUTH EVERY DAY (Patient taking differently: Take 10 mg by mouth daily.) 90 tablet 2   Biotin 1000 MCG tablet Take 1,000 mcg by mouth daily.     Blood Glucose Monitoring Suppl (ACCU-CHEK GUIDE) w/Device KIT 1 each by Does not apply route 2 (two) times daily. To test sugars. Dx. E11.9 1 kit 1   Colchicine (MITIGARE) 0.6 MG CAPS Take 1 capsule by mouth 2 (two) times daily as needed. (Patient taking differently: Take 1 capsule by mouth 2 (two) times daily as needed (gout).) 60 capsule 3   DULoxetine (CYMBALTA) 30 MG capsule Take 3 capsules (90 mg total) by mouth daily. 270 capsule 1   furosemide (LASIX) 20 MG tablet TAKE 1 TABLET BY MOUTH AS NEEDED (Patient taking differently: Take 20 mg by mouth daily as needed  for edema.) 90 tablet 1   glucose blood (ACCU-CHEK GUIDE) test strip CHECK BLOOD SUGARS DAILY 100 strip 12   labetalol (NORMODYNE) 100 MG tablet Take 1 tablet (100 mg total) by mouth 2 (two) times daily. 180 tablet 2   levothyroxine (SYNTHROID) 25 MCG tablet TAKE 1 TABLET BY MOUTH EVERY DAY BEFORE BREAKFAST (Patient taking differently: Take 25 mcg by mouth daily before breakfast.) 90 tablet 1   Magnesium 500 MG TABS Take 500 mg by mouth daily.     metFORMIN (GLUCOPHAGE-XR) 500 MG 24 hr tablet Take 500 mg by mouth daily with breakfast.  3   mirtazapine (REMERON) 7.5 MG tablet Take 1 tablet (7.5 mg total) by mouth at bedtime. 90 tablet 1   nitroGLYCERIN (NITROSTAT) 0.4 MG SL tablet Place 1 tablet (0.4 mg total) under the tongue every 5 (five) minutes as needed for chest pain. 30 tablet 3   pantoprazole (PROTONIX) 40 MG tablet Take 1 tablet (40 mg total) by mouth 2 (two) times daily before a meal. 60 tablet 2   RESTASIS MULTIDOSE 0.05 % ophthalmic emulsion Place 1 drop into both eyes 2 (two) times daily.   6   rosuvastatin (CRESTOR) 20 MG tablet Take 20 mg by mouth daily.     valsartan (DIOVAN) 320 MG tablet Take 1 tablet (320 mg total) by mouth daily. 90 tablet 2   Vitamin D, Ergocalciferol, (DRISDOL) 1.25 MG (50000 UNIT) CAPS capsule TAKE 1 CAPSULE BY MOUTH ONE TIME PER WEEK (Patient taking differently: Take 50,000 Units by mouth every Sunday.) 12 capsule 1   warfarin (COUMADIN) 5 MG tablet 68m on Sunday, 2.560mAll other days (Patient taking differently: Take 2.5-5 mg by mouth See admin instructions. 73m74mn Saturday,Tuesday then take  2.73mg59ml other days) 70 tablet 2   No current facility-administered medications for this visit.    REVIEW OF SYSTEMS:   .10 Point review of Systems was done is negative except as noted above.   PHYSICAL EXAMINATION: ECOG PERFORMANCE STATUS:1  . Vitals:   10/11/20 1301  BP: 137/75  Pulse: 69  Resp: 17  Temp: 98.2 F (36.8 C)  SpO2: 100%   Filed Weights    10/11/20 1301  Weight: 158 lb (71.7 kg)   .Body mass index is  27.12 kg/m.  Marland Kitchen GENERAL:alert, in no acute distress and comfortable SKIN: no acute rashes, no significant lesions EYES: conjunctiva are pink and non-injected, sclera anicteric OROPHARYNX: MMM, no exudates, no oropharyngeal erythema or ulceration NECK: supple, no JVD LYMPH:  no palpable lymphadenopathy in the cervical, axillary or inguinal regions LUNGS: clear to auscultation b/l with normal respiratory effort HEART: regular rate & rhythm ABDOMEN:  normoactive bowel sounds , non tender, not distended. Extremity: no pedal edema PSYCH: alert & oriented x 3 with fluent speech NEURO: no focal motor/sensory deficits   LABORATORY DATA:  I have reviewed the data as listed  . CBC Latest Ref Rng & Units 09/23/2020 09/21/2020 09/18/2020  WBC 4.0 - 10.5 K/uL 6.6 6.0 6.2  Hemoglobin 12.0 - 15.0 g/dL 10.7(L) 10.2(L) 10.8(L)  Hematocrit 36.0 - 46.0 % 31.2(L) 31.1(L) 33.9(L)  Platelets 150.0 - 400.0 K/uL 201.0 203 215   . CBC    Component Value Date/Time   WBC 6.6 09/23/2020 1143   RBC 3.39 (L) 09/23/2020 1143   HGB 10.7 (L) 09/23/2020 1143   HGB 9.5 (L) 08/01/2016 1527   HCT 31.2 (L) 09/23/2020 1143   HCT 31.2 (L) 08/01/2016 1527   PLT 201.0 09/23/2020 1143   PLT 221 08/01/2016 1527   MCV 91.9 09/23/2020 1143   MCV 83.9 08/01/2016 1527   MCH 31.7 09/21/2020 1820   MCHC 34.3 09/23/2020 1143   RDW 13.2 09/23/2020 1143   RDW 17.2 (H) 08/01/2016 1527   LYMPHSABS 1.8 09/23/2020 1143   LYMPHSABS 2.0 08/01/2016 1527   MONOABS 0.7 09/23/2020 1143   MONOABS 0.4 08/01/2016 1527   EOSABS 0.1 09/23/2020 1143   EOSABS 0.1 08/01/2016 1527   BASOSABS 0.0 09/23/2020 1143   BASOSABS 0.0 08/01/2016 1527    . CMP Latest Ref Rng & Units 09/23/2020 09/21/2020 09/18/2020  Glucose 70 - 99 mg/dL 92 97 94  BUN 6 - 23 mg/dL 24(H) 36(H) 25(H)  Creatinine 0.40 - 1.20 mg/dL 1.36(H) 1.55(H) 1.09(H)  Sodium 135 - 145 mEq/L 140 137 144  Potassium  3.5 - 5.1 mEq/L 4.8 4.6 4.2  Chloride 96 - 112 mEq/L 108 108 109  CO2 19 - 32 mEq/L '22 22 26  ' Calcium 8.4 - 10.5 mg/dL 9.4 8.7(L) 9.0  Total Protein 6.0 - 8.3 g/dL 6.4 6.4(L) 6.0(L)  Total Bilirubin 0.2 - 1.2 mg/dL 0.4 0.6 0.6  Alkaline Phos 39 - 117 U/L 40 36(L) 36(L)  AST 0 - 37 U/L 31 38 34  ALT 0 - 35 U/L 27 36 34    RADIOGRAPHIC STUDIES: I have personally reviewed the radiological images as listed and agreed with the findings in the report. DG Chest Port 1 View  Result Date: 09/21/2020 CLINICAL DATA:  Weakness, fatigue EXAM: PORTABLE CHEST 1 VIEW COMPARISON:  Radiograph 09/16/2020, CT 09/01/2008 FINDINGS: Unchanged cardiomediastinal silhouette. There is no new focal airspace consolidation. There is no large pleural effusion or visible pneumothorax. Multiple surgical clips noted and cervical spine cerclage wire noted. No acute osseous abnormality. IMPRESSION: No new focal airspace consolidation. Electronically Signed   By: Maurine Simmering   On: 09/21/2020 18:39    ASSESSMENT & PLAN:   80 year old female with multiple medical comorbidities with  #1 history of recurrent pulmonary embolism. First event in 2008 was possibly provoked by long-distance Robina Ade journey from [place] to [place]. However the patient had a second event in 2009 soon after being off Coumadin and this was apparently unprovoked. She has already been recommended indefinite anticoagulation by her  pulmonologist and has been on Coumadin without any acute issues. PLan -Patient is here for follow-up and has not had any new VTE events since her previous consultation with [place] in 2018. She is scheduled for an EGD on 10/18/2020 and is here for recommendations regarding anticoagulation management for this. -Her last INR measurement on 10/06/2020 showed an INR of 3.  She is in the therapeutic range. -She was recommended to she will hold her coumadin from 7/26 i.e. at least 5 days prior to procedure. -Start prophylaxis dose  of Lovenox @ 30m [place] daily from 7/27 until 24 hours prior to procedure. -Restart lovenox and coumadin at the discretion of the gastroenterologist post procedure based on hemostasis and risk of bleeding.   #2 mild normocytic anemia . this appears to be likely related to iron deficiency or anemia of chronic kidney disease. Patient has multiple risk factors for iron deficiency including possible slow GI losses on anticoagulation with Coumadin/aspirin/SSRI/Cox 2 inhibitors. She is also on chronic PPI therapy which may interfere with iron absorption. Plan Continue follow-up with primary care physician. Would recommend p.o. iron replacement to maintain ferritin more than 100 in the context of chronic kidney disease.  All of the patients questions were answered with apparent satisfaction. The patient knows to call the clinic with any problems, questions or concerns.  I spent 30 minutes counseling the patient face to face. The total time spent in the appointment was 40 minutes and more than 50% was on counseling and direct patient cares.    GSullivan LoneMD MMarshfield HillsAAHIVMS SCentral Jersey Surgery Center LLCCVa [facility] Care System SeattleHematology/Oncology Physician CMontana State Hospital (Office):       [phone](Work cell):  [phone](Fax):           [phone]

## 2020-10-11 ENCOUNTER — Inpatient Hospital Stay: Payer: Medicare PPO | Attending: Hematology | Admitting: Hematology

## 2020-10-11 ENCOUNTER — Other Ambulatory Visit: Payer: Self-pay

## 2020-10-11 VITALS — BP 137/75 | HR 69 | Temp 98.2°F | Resp 17 | Wt 158.0 lb

## 2020-10-11 DIAGNOSIS — Z86711 Personal history of pulmonary embolism: Secondary | ICD-10-CM | POA: Diagnosis not present

## 2020-10-11 DIAGNOSIS — Z01818 Encounter for other preprocedural examination: Secondary | ICD-10-CM

## 2020-10-11 DIAGNOSIS — Z7901 Long term (current) use of anticoagulants: Secondary | ICD-10-CM | POA: Diagnosis not present

## 2020-10-11 MED ORDER — ENOXAPARIN SODIUM 60 MG/0.6ML IJ SOSY: 60.0000 mg | PREFILLED_SYRINGE | INTRAMUSCULAR | 0 refills | Status: DC

## 2020-10-11 NOTE — Patient Instructions (Signed)
Patient scheduled for EGD on 8/1 She will hold her coumadin from 7/26 Start prophylaxis dose of Lovenox @ 60mg  [place] daily from 7/27 until 24 hours prior to procedure. Restart lovenox and coumadin at the discretion of the gastroenterologist post procedure based on hemostasis and risk of bleeding.

## 2020-10-12 NOTE — Patient Instructions (Signed)
Andrea Simmons  10/12/2020     @PREFPERIOPPHARMACY @   Your procedure is scheduled on  10/18/2020.   Report to [facility] at   [place].   Call this number if you have problems the morning of surgery:  [phone]   Remember:  Follow the diet instructions given to you by the office.     Take these medicines the morning of surgery with A SIP OF WATER         amlodipine, cymbalta, labetolol, levothyroxine, protonix.     Your last dose of coumadin should be 10/13/2020.        You should start your Lovenox bridge on 7/27-7/30.    Do not wear jewelry, make-up or nail polish.  Do not wear lotions, powders, or perfumes, or deodorant.  Do not shave 48 hours prior to surgery.  Men may shave face and neck.  Do not bring valuables to the hospital.  [facility] is not responsible for any belongings or valuables.  Contacts, dentures or bridgework may not be worn into surgery.  Leave your suitcase in the car.  After surgery it may be brought to your room.  For patients admitted to the hospital, discharge time will be determined by your treatment team.  Patients discharged the day of surgery will not be allowed to drive home and must have someone with them for 24 hours.    Special instructions:     DO NOT smoke tobacco or vape for 24 hours before your procedure.   Please read over the following fact sheets that you were given. Anesthesia Post-op Instructions and Care and Recovery After Surgery      Upper Endoscopy, Adult, Care After This sheet gives you information about how to care for yourself after your procedure. Your health care provider may also give you more specific instructions. If you have problems or questions, contact your health careprovider. What can I expect after the procedure? After the procedure, it is common to have: A sore throat. Mild stomach pain or discomfort. Bloating. Nausea. Follow these instructions at home:  Follow instructions from  your health care provider about what to eat or drink after your procedure. Return to your normal activities as told by your health care provider. Ask your health care provider what activities are safe for you. Take over-the-counter and prescription medicines only as told by your health care provider. If you were given a sedative during the procedure, it can affect you for several hours. Do not drive or operate machinery until your health care provider says that it is safe. Keep all follow-up visits as told by your health care provider. This is important. Contact a health care provider if you have: A sore throat that lasts longer than one day. Trouble swallowing. Get help right away if: You vomit blood or your vomit looks like coffee grounds. You have: A fever. Bloody, black, or tarry stools. A severe sore throat or you cannot swallow. Difficulty breathing. Severe pain in your chest or abdomen. Summary After the procedure, it is common to have a sore throat, mild stomach discomfort, bloating, and nausea. If you were given a sedative during the procedure, it can affect you for several hours. Do not drive or operate machinery until your health care provider says that it is safe. Follow instructions from your health care provider about what to eat or drink after your procedure. Return to your normal activities as told by your health  care provider. This information is not intended to replace advice given to you by your health care provider. Make sure you discuss any questions you have with your healthcare provider. Document Revised: 03/04/2019 Document Reviewed: 08/06/2017 Elsevier Patient Education  2022 [employer]. https://www.asge.org/home/for-patients/patient-information/understanding-eso-dilation-updated">  Esophageal Dilatation Esophageal dilatation, also called esophageal dilation, is a procedure to widen or open a blocked or narrowed part of the esophagus. The esophagus is the part of  the body that moves food and liquid from the mouth to the stomach. You may need this procedure if: You have a buildup of scar tissue in your esophagus that makes it difficult, painful, or impossible to swallow. This can be caused by gastroesophageal reflux disease (GERD). You have cancer of the esophagus. There is a problem with how food moves through your esophagus. In some cases, you may need this procedure repeated at a later time to dilatethe esophagus gradually. Tell a health care provider about: Any allergies you have. All medicines you are taking, including vitamins, herbs, eye drops, creams, and over-the-counter medicines. Any problems you or family members have had with anesthetic medicines. Any blood disorders you have. Any surgeries you have had. Any medical conditions you have. Any antibiotic medicines you are required to take before dental procedures. Whether you are pregnant or may be pregnant. What are the risks? Generally, this is a safe procedure. However, problems may occur, including: Bleeding due to a tear in the lining of the esophagus. A hole, or perforation, in the esophagus. What happens before the procedure? Ask your health care provider about: Changing or stopping your regular medicines. This is especially important if you are taking diabetes medicines or blood thinners. Taking medicines such as aspirin and ibuprofen. These medicines can thin your blood. Do not take these medicines unless your health care provider tells you to take them. Taking over-the-counter medicines, vitamins, herbs, and supplements. Follow instructions from your health care provider about eating or drinking restrictions. Plan to have a responsible adult take you home from the hospital or clinic. Plan to have a responsible adult care for you for the time you are told after you leave the hospital or clinic. This is important. What happens during the procedure? You may be given a medicine to  help you relax (sedative). A numbing medicine may be sprayed into the back of your throat, or you may gargle the medicine. Your health care provider may perform the dilatation using various surgical instruments, such as: Simple dilators. This instrument is carefully placed in the esophagus to stretch it. Guided wire bougies. This involves using an endoscope to insert a wire into the esophagus. A dilator is passed over this wire to enlarge the esophagus. Then the wire is removed. Balloon dilators. An endoscope with a small balloon is inserted into the esophagus. The balloon is inflated to stretch the esophagus and open it up. The procedure may vary among health care providers and hospitals. What can I expect after the procedure? Your blood pressure, heart rate, breathing rate, and blood oxygen level will be monitored until you leave the hospital or clinic. Your throat may feel slightly sore and numb. This will get better over time. You will not be allowed to eat or drink until your throat is no longer numb. When you are able to drink, urinate, and sit on the edge of the bed without nausea or dizziness, you may be able to return home. Follow these instructions at home: Take over-the-counter and prescription medicines only as told by  your health care provider. If you were given a sedative during the procedure, it can affect you for several hours. Do not drive or operate machinery until your health care provider says that it is safe. Plan to have a responsible adult care for you for the time you are told. This is important. Follow instructions from your health care provider about any eating or drinking restrictions. Do not use any products that contain nicotine or tobacco, such as cigarettes, e-cigarettes, and chewing tobacco. If you need help quitting, ask your health care provider. Keep all follow-up visits. This is important. Contact a health care provider if: You have a fever. You have pain that  is not relieved by medicine. Get help right away if: You have chest pain. You have trouble breathing. You have trouble swallowing. You vomit blood. You have black, tarry, or bloody stools. These symptoms may represent a serious problem that is an emergency. Do not wait to see if the symptoms will go away. Get medical help right away. Call your local emergency services (911 in the [place] Do not drive yourself to the hospital. Summary Esophageal dilatation, also called esophageal dilation, is a procedure to widen or open a blocked or narrowed part of the esophagus. Plan to have a responsible adult take you home from the hospital or clinic. For this procedure, a numbing medicine may be sprayed into the back of your throat, or you may gargle the medicine. Do not drive or operate machinery until your health care provider says that it is safe. This information is not intended to replace advice given to you by your health care provider. Make sure you discuss any questions you have with your healthcare provider. Document Revised: 07/23/2019 Document Reviewed: 07/23/2019 Elsevier Patient Education  [place] After This sheet gives you information about how to care for yourself after your procedure. Your health care provider may also give you more specific instructions. If you have problems or questions, contact your health careprovider. What can I expect after the procedure? After the procedure, it is common to have: Tiredness. Forgetfulness about what happened after the procedure. Impaired judgment for important decisions. Nausea or vomiting. Some difficulty with balance. Follow these instructions at home: For the time period you were told by your health care provider:     Rest as needed. Do not participate in activities where you could fall or become injured. Do not drive or use machinery. Do not drink alcohol. Do not take sleeping pills or  medicines that cause drowsiness. Do not make important decisions or sign legal documents. Do not take care of children on your own. Eating and drinking Follow the diet that is recommended by your health care provider. Drink enough fluid to keep your urine pale yellow. If you vomit: Drink water, juice, or soup when you can drink without vomiting. Make sure you have little or no nausea before eating solid foods. General instructions Have a responsible adult stay with you for the time you are told. It is important to have someone help care for you until you are awake and alert. Take over-the-counter and prescription medicines only as told by your health care provider. If you have sleep apnea, surgery and certain medicines can increase your risk for breathing problems. Follow instructions from your health care provider about wearing your sleep device: Anytime you are sleeping, including during daytime naps. While taking prescription pain medicines, sleeping medicines, or medicines that make you drowsy. Avoid smoking. Keep  all follow-up visits as told by your health care provider. This is important. Contact a health care provider if: You keep feeling nauseous or you keep vomiting. You feel light-headed. You are still sleepy or having trouble with balance after 24 hours. You develop a rash. You have a fever. You have redness or swelling around the IV site. Get help right away if: You have trouble breathing. You have new-onset confusion at home. Summary For several hours after your procedure, you may feel tired. You may also be forgetful and have poor judgment. Have a responsible adult stay with you for the time you are told. It is important to have someone help care for you until you are awake and alert. Rest as told. Do not drive or operate machinery. Do not drink alcohol or take sleeping pills. Get help right away if you have trouble breathing, or if you suddenly become confused. This  information is not intended to replace advice given to you by your health care provider. Make sure you discuss any questions you have with your healthcare provider. Document Revised: 11/20/2019 Document Reviewed: 02/06/2019 Elsevier Patient Education  2022 [employer].

## 2020-10-13 ENCOUNTER — Other Ambulatory Visit: Payer: Self-pay

## 2020-10-13 ENCOUNTER — Encounter (HOSPITAL_COMMUNITY)
Admission: RE | Admit: 2020-10-13 | Discharge: 2020-10-13 | Disposition: A | Discharge status: Home / Self Care | Payer: Medicare PPO | Source: Ambulatory Visit | Attending: Internal Medicine | Admitting: Internal Medicine

## 2020-10-13 ENCOUNTER — Other Ambulatory Visit: Payer: Self-pay | Admitting: Hematology

## 2020-10-13 ENCOUNTER — Encounter (HOSPITAL_COMMUNITY): Payer: Self-pay

## 2020-10-13 NOTE — Progress Notes (Signed)
Contacted pt per her request, Pt wanted to verify how to give Lovenox. Reviewed injection instructions with pt. Pt verbalized understanding.

## 2020-10-18 ENCOUNTER — Ambulatory Visit (HOSPITAL_COMMUNITY)
Admission: RE | Admit: 2020-10-18 | Discharge: 2020-10-18 | Disposition: A | Discharge status: Home / Self Care | Payer: Medicare PPO | Attending: Internal Medicine | Admitting: Internal Medicine

## 2020-10-18 ENCOUNTER — Other Ambulatory Visit: Payer: Self-pay

## 2020-10-18 ENCOUNTER — Telehealth: Payer: Self-pay | Admitting: Family Medicine

## 2020-10-18 ENCOUNTER — Encounter (HOSPITAL_COMMUNITY): Payer: Self-pay

## 2020-10-18 ENCOUNTER — Encounter (HOSPITAL_COMMUNITY)
Admission: RE | Disposition: A | Discharge status: Home / Self Care | Payer: Self-pay | Source: Home / Self Care | Attending: Internal Medicine

## 2020-10-18 ENCOUNTER — Ambulatory Visit (HOSPITAL_COMMUNITY): Payer: Medicare PPO | Admitting: Anesthesiology

## 2020-10-18 ENCOUNTER — Telehealth: Payer: Self-pay | Admitting: General Practice

## 2020-10-18 DIAGNOSIS — K319 Disease of stomach and duodenum, unspecified: Secondary | ICD-10-CM | POA: Insufficient documentation

## 2020-10-18 DIAGNOSIS — K2970 Gastritis, unspecified, without bleeding: Secondary | ICD-10-CM

## 2020-10-18 DIAGNOSIS — Z86711 Personal history of pulmonary embolism: Secondary | ICD-10-CM | POA: Diagnosis not present

## 2020-10-18 DIAGNOSIS — E039 Hypothyroidism, unspecified: Secondary | ICD-10-CM | POA: Diagnosis not present

## 2020-10-18 DIAGNOSIS — R1013 Epigastric pain: Secondary | ICD-10-CM | POA: Diagnosis not present

## 2020-10-18 DIAGNOSIS — Z7901 Long term (current) use of anticoagulants: Secondary | ICD-10-CM | POA: Diagnosis not present

## 2020-10-18 DIAGNOSIS — Z7984 Long term (current) use of oral hypoglycemic drugs: Secondary | ICD-10-CM | POA: Insufficient documentation

## 2020-10-18 DIAGNOSIS — Z885 Allergy status to narcotic agent status: Secondary | ICD-10-CM | POA: Insufficient documentation

## 2020-10-18 DIAGNOSIS — K222 Esophageal obstruction: Secondary | ICD-10-CM | POA: Diagnosis not present

## 2020-10-18 DIAGNOSIS — Z886 Allergy status to analgesic agent status: Secondary | ICD-10-CM | POA: Insufficient documentation

## 2020-10-18 DIAGNOSIS — Z79890 Hormone replacement therapy (postmenopausal): Secondary | ICD-10-CM | POA: Diagnosis not present

## 2020-10-18 DIAGNOSIS — R1310 Dysphagia, unspecified: Secondary | ICD-10-CM | POA: Insufficient documentation

## 2020-10-18 DIAGNOSIS — Z853 Personal history of malignant neoplasm of breast: Secondary | ICD-10-CM | POA: Diagnosis not present

## 2020-10-18 DIAGNOSIS — Z79899 Other long term (current) drug therapy: Secondary | ICD-10-CM | POA: Diagnosis not present

## 2020-10-18 DIAGNOSIS — Z888 Allergy status to other drugs, medicaments and biological substances status: Secondary | ICD-10-CM | POA: Insufficient documentation

## 2020-10-18 DIAGNOSIS — D649 Anemia, unspecified: Secondary | ICD-10-CM | POA: Diagnosis not present

## 2020-10-18 HISTORY — PX: BALLOON DILATION: SHX5330

## 2020-10-18 HISTORY — PX: BIOPSY: SHX5522

## 2020-10-18 HISTORY — PX: ESOPHAGOGASTRODUODENOSCOPY (EGD) WITH PROPOFOL: SHX5813

## 2020-10-18 LAB — GLUCOSE, CAPILLARY: Glucose-Capillary: 98 mg/dL (ref 70–99)

## 2020-10-18 SURGERY — ESOPHAGOGASTRODUODENOSCOPY (EGD) WITH PROPOFOL
Anesthesia: General

## 2020-10-18 MED ORDER — PROPOFOL 10 MG/ML IV BOLUS
INTRAVENOUS | Status: DC | PRN
  Administered 2020-10-18 (×2): 50 mg via INTRAVENOUS
  Administered 2020-10-18: 20 mg via INTRAVENOUS

## 2020-10-18 MED ORDER — STERILE WATER FOR IRRIGATION IR SOLN
Status: DC | PRN
  Administered 2020-10-18: 200 mL

## 2020-10-18 MED ORDER — LACTATED RINGERS IV SOLN: INTRAVENOUS | Status: DC

## 2020-10-18 NOTE — Telephone Encounter (Signed)
Pt call and stated she want Andrea Simmons to give her a  call.

## 2020-10-18 NOTE — Op Note (Signed)
[facility] Patient Name: Andrea Simmons Procedure Date: 10/18/2020 10:45 AM MRN: [id number] Date of Birth: 11-Oct-1940 Attending MD: Elon Alas. Abbey Chatters DO CSN: [id number] Age: 80 Admit Type: Outpatient Procedure:                Upper GI endoscopy Indications:              Dysphagia Providers:                Elon Alas. Abbey Chatters, DO, Jessica Boudreaux, Randa Spike, Technician Referring MD:              Medicines:                See the Anesthesia note for documentation of the                            administered medications Complications:            No immediate complications. Estimated Blood Loss:     Estimated blood loss was minimal. Procedure:                Pre-Anesthesia Assessment:                           - The anesthesia plan was to use monitored                            anesthesia care (MAC).                           After obtaining informed consent, the endoscope was                            passed under direct vision. Throughout the                            procedure, the patient's blood pressure, pulse, and                            oxygen saturations were monitored continuously. The                            [id number]-H190 ([id number]) scope was introduced through the                            mouth, and advanced to the second part of duodenum.                            The upper GI endoscopy was accomplished without                            difficulty. The patient tolerated the procedure                            well. Scope In: 11:00:48 AM Scope Out: 10:27:25  AM Total Procedure Duration: 0 hours 5 minutes 24 seconds  Findings:      A mild Schatzki ring was found in the lower third of the esophagus. A       TTS dilator was passed through the scope. Dilation with an 18-19-20 mm       balloon dilator was performed to 20 mm. The dilation site was examined       and showed moderate improvement in luminal narrowing.      Diffuse  mild inflammation characterized by erythema was found in the       entire examined stomach. Biopsies were taken with a cold forceps for       Helicobacter pylori testing.      The duodenal bulb, first portion of the duodenum and second portion of       the duodenum were normal. Impression:               - Mild Schatzki ring. Dilated.                           - Gastritis. Biopsied.                           - Normal duodenal bulb, first portion of the                            duodenum and second portion of the duodenum. Moderate Sedation:      Per Anesthesia Care Recommendation:           - Patient has a contact number available for                            emergencies. The signs and symptoms of potential                            delayed complications were discussed with the                            patient. Return to normal activities tomorrow.                            Written discharge instructions were provided to the                            patient.                           - Resume previous diet.                           - Continue present medications.                           - Await pathology results.                           - Repeat upper endoscopy PRN for retreatment.                           -  Return to GI clinic PRN.                           - Continue on pantoprazole                           - Avoid NSAIDS                           - Resume coumadin tomorow Procedure Code(s):        --- Professional ---                           [phone], Esophagogastroduodenoscopy, flexible,                            transoral; with transendoscopic balloon dilation of                            esophagus (less than 30 mm diameter)                           [id number], 59, Esophagogastroduodenoscopy, flexible,                            transoral; with biopsy, single or multiple Diagnosis Code(s):        --- Professional ---                           K22.2, Esophageal  obstruction                           K29.70, Gastritis, unspecified, without bleeding                           R13.10, Dysphagia, unspecified CPT copyright 2019 American Medical Association. All rights reserved. The codes documented in this report are preliminary and upon coder review may  be revised to meet current compliance requirements. Elon Alas. Abbey Chatters, DO [place] Abbey Chatters, DO 10/18/2020 11:09:23 AM This report has been signed electronically. Number of Addenda: 0

## 2020-10-18 NOTE — Anesthesia Preprocedure Evaluation (Addendum)
Anesthesia Evaluation  Patient identified by MRN, date of birth, ID band Patient awake    Reviewed: Allergy & Precautions, NPO status , Patient's Chart, lab work & pertinent test results, reviewed documented beta blocker date and time   History of Anesthesia Complications Negative for: history of anesthetic complications  Airway Mallampati: II  TM Distance: >3 FB Neck ROM: Full    Dental  (+) Dental Advisory Given, Missing   Pulmonary PE   Pulmonary exam normal breath sounds clear to auscultation       Cardiovascular Exercise Tolerance: Good hypertension, Pt. on home beta blockers and Pt. on medications Normal cardiovascular exam+ dysrhythmias  Rhythm:Regular Rate:Normal     Neuro/Psych PSYCHIATRIC DISORDERS Anxiety Depression  Neuromuscular disease    GI/Hepatic GERD  Medicated,  Endo/Other  diabetes, Well Controlled, Type 2, Oral Hypoglycemic AgentsHypothyroidism   Renal/GU Renal InsufficiencyRenal disease     Musculoskeletal  (+) Fibromyalgia -  Abdominal   Peds  Hematology  (+) anemia ,   Anesthesia Other Findings   Reproductive/Obstetrics                          Anesthesia Physical Anesthesia Plan  ASA: 3  Anesthesia Plan: General   Post-op Pain Management:    Induction:   PONV Risk Score and Plan: Propofol infusion  Airway Management Planned: Nasal Cannula and Natural Airway  Additional Equipment:   Intra-op Plan:   Post-operative Plan:   Informed Consent: I have reviewed the patients History and Physical, chart, labs and discussed the procedure including the risks, benefits and alternatives for the proposed anesthesia with the patient or authorized representative who has indicated his/her understanding and acceptance.     Dental advisory given  Plan Discussed with: CRNA and Surgeon  Anesthesia Plan Comments:        Anesthesia Quick Evaluation

## 2020-10-18 NOTE — Anesthesia Postprocedure Evaluation (Signed)
Anesthesia Post Note  Patient: Andrea Simmons  Procedure(s) Performed: ESOPHAGOGASTRODUODENOSCOPY (EGD) WITH PROPOFOL BALLOON DILATION  Patient location during evaluation: Short Stay Anesthesia Type: General Level of consciousness: awake and alert Pain management: pain level controlled Vital Signs Assessment: post-procedure vital signs reviewed and stable Respiratory status: spontaneous breathing Cardiovascular status: blood pressure returned to baseline and stable Postop Assessment: no apparent nausea or vomiting Anesthetic complications: no   No notable events documented.   Last Vitals:  Vitals:   10/18/20 0934  BP: (!) 146/71  Pulse: 70  Resp: 20  Temp: 36.6 C  SpO2: 100%    Last Pain:  Vitals:   10/18/20 0934  TempSrc: Oral  PainSc:                  [name]

## 2020-10-18 NOTE — H&P (Signed)
Primary Care Physician:  [name], MD Primary Gastroenterologist:  Dr. Abbey Chatters  Pre-Procedure History & Physical: HPI:  Andrea Simmons is a 80 y.o. female is here for an EGD with dilation for dysphagia, epigastric pain, anemia. Patient notes her dysphagia is chronic primarily with solids but occasionally liquids as well.  Also notes occasional vomiting as well.  She has been on a couple courses of oral prednisone in the past months.  Past Medical History:  Diagnosis Date   Anemia    Anxiety    Breast cancer ([place])    Depression    Diabetes mellitus type II    Fibromyalgia    GERD (gastroesophageal reflux disease)    Hyperlipidemia    Hypertension    Malignant neoplasm of breast (female), unspecified site 1993   L breast s/p mastectomy and tamoxifen x 79yr   Other pulmonary embolism and infarction 2008 and 2009   chronic anticoag - LeB CC   Unspecified hypothyroidism     Past Surgical History:  Procedure Laterality Date   APPENDECTOMY     BREAST BIOPSY Left 1993   BREAST BIOPSY Right 09/25/2014   stero. Benign   BREAST RECONSTRUCTION Left    CATARACT EXTRACTION     MASTECTOMY Left    ROTATOR CUFF REPAIR     SPINAL FUSION      x 2   TOTAL ABDOMINAL HYSTERECTOMY W/ BILATERAL SALPINGOOPHORECTOMY     TUBAL LIGATION      Prior to Admission medications   Medication Sig Start Date End Date Taking? Authorizing Provider  amLODipine (NORVASC) 10 MG tablet TAKE 1 TABLET BY MOUTH EVERY DAY Patient taking differently: Take 10 mg by mouth daily. 05/17/20  Yes [name], [name] MD  Colchicine (MITIGARE) 0.6 MG CAPS Take 1 capsule by mouth 2 (two) times daily as needed. Patient taking differently: Take 1 capsule by mouth 2 (two) times daily as needed (gout). 09/16/20  Yes [name], [name] MD  DULoxetine (CYMBALTA) 30 MG capsule Take 3 capsules (90 mg total) by mouth daily. 08/12/20  Yes [name], [name], MD  enoxaparin (LOVENOX) 60 MG/0.6ML injection Inject 0.6 mLs (60 mg  total) into the skin daily. 10/11/20  Yes KBrunetta Genera MD  labetalol (NORMODYNE) 100 MG tablet Take 1 tablet (100 mg total) by mouth 2 (two) times daily. 09/13/20  Yes [name], [name], MD  levothyroxine (SYNTHROID) 25 MCG tablet TAKE 1 TABLET BY MOUTH EVERY DAY BEFORE BREAKFAST Patient taking differently: Take 25 mcg by mouth daily before breakfast. 04/28/20  Yes TMidge Minium MD  Magnesium 500 MG TABS Take 500 mg by mouth daily.   Yes [provider]  metFORMIN (GLUCOPHAGE-XR) 500 MG 24 hr tablet Take 500 mg by mouth daily with breakfast. 03/23/16  Yes [provider]  mirtazapine (REMERON) 7.5 MG tablet Take 1 tablet (7.5 mg total) by mouth at bedtime. 08/12/20  Yes [name], [name], MD  nitroGLYCERIN (NITROSTAT) 0.4 MG SL tablet Place 1 tablet (0.4 mg total) under the tongue every 5 (five) minutes as needed for chest pain. 10/31/18 03/03/21 Yes [name], [name], MD  pantoprazole (PROTONIX) 40 MG tablet Take 1 tablet (40 mg total) by mouth 2 (two) times daily before a meal. 09/18/20 10/18/20 Yes [name], [name], DO  RESTASIS MULTIDOSE 0.05 % ophthalmic emulsion Place 1 drop into both eyes 2 (two) times daily.  06/21/16  Yes [provider]  rosuvastatin (CRESTOR) 20 MG tablet Take 20 mg by mouth daily.  Yes [provider]  valsartan (DIOVAN) 320 MG tablet Take 1 tablet (320 mg total) by mouth daily. 09/13/20  Yes [name], [name], MD  Vitamin D, Ergocalciferol, (DRISDOL) 1.25 MG (50000 UNIT) CAPS capsule TAKE 1 CAPSULE BY MOUTH ONE TIME PER WEEK Patient taking differently: Take 50,000 Units by mouth every Sunday. 06/17/20  Yes [name], Billey Co., MD  warfarin (COUMADIN) 5 MG tablet 36m on Sunday, 2.516mAll other days Patient taking differently: Take 2.5-5 mg by mouth See admin instructions. 42m88mn Saturday,Tuesday then take  2.42mg54ml other days 02/06/20  Yes [name]  Accu-Chek Softclix Lancets lancets USE AS INSTRUCTED TO TEST  SUGARS TWICE A DAY 08/18/20   [name]  Biotin 1000 MCG tablet Take 1,000 mcg by mouth daily.    [provider]  Blood Glucose Monitoring Suppl (ACCU-CHEK GUIDE) w/Device KIT 1 each by Does not apply route 2 (two) times daily. To test sugars. Dx. E11.9 06/23/19   [name]  furosemide (LASIX) 20 MG tablet TAKE 1 TABLET BY MOUTH AS NEEDED Patient taking differently: Take 20 mg by mouth daily as needed for edema. 08/27/19   [name], ManiReynold Bowen  glucose blood (ACCU-CHEK GUIDE) test strip CHECK BLOOD SUGARS DAILY 08/20/19   [name]    Allergies as of 09/21/2020 - Review Complete 09/21/2020  Allergen Reaction Noted   Anaprox [naproxen sodium]  11/14/2010   Lipitor [atorvastatin]  03/01/2014   Morphine  08/27/2008   Meperidine Nausea And Vomiting 10/05/2014   Naproxen sodium Nausea And Vomiting 10/05/2014    Family History  Problem Relation Age of Onset   Depression Other        Parent   Arthritis Other        Parent, Grandparent   Hypertension Other        Grandparent   Hyperlipidemia Other        FMH [place]arriages / Stillbirths Other        Grandparent   Stroke Other        [facility] [place] Maternal Uncle        prostate   Hypertension Maternal Grandfather    Breast cancer Cousin    Breast cancer Cousin     Social History   Socioeconomic History   Marital status: Married    Spouse name: Not on file   Number of children: 1   Years of education: Not on file   Highest education level: Not on file  Occupational History   Occupation: teacArmed forces technical officerTIRED   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  Tobacco Use   Smoking status: Never   Smokeless tobacco: Never  VapiScientific laboratory technician: Never used  Substance and Sexual Activity   Alcohol use: No    Alcohol/week: 0.0 standard drinks   Drug use: No   Sexual activity: Not on file  Other Topics Concern   Not on file  Social History  Narrative   Not on file   Social Determinants of Health   Financial Resource Strain: Low Risk    Difficulty of Paying Living Expenses: Not hard at all  Food Insecurity: No Food Insecurity   Worried About RunnCharity fundraiserthe Last Year: Never true   Ran Taliaferrothe Last Year: Never true  Transportation Needs: No Transportation Needs   Lack of Transportation (Medical): No  Lack of Transportation (Non-Medical): No  Physical Activity: Inactive   Days of Exercise per Week: 0 days   Minutes of Exercise per Session: 0 min  Stress: No Stress Concern Present   Feeling of Stress : Only a little  Social Connections: Moderately Integrated   Frequency of Communication with Friends and Family: More than three times a week   Frequency of Social Gatherings with Friends and Family: Once a week   Attends Religious Services: 1 to 4 times per year   Active Member of [employer] or Organizations: No   Attends Music therapist: Never   Marital Status: Married  Human resources officer Violence: Not At Risk   Fear of Current or Ex-Partner: No   Emotionally Abused: No   Physically Abused: No   Sexually Abused: No    Review of Systems: See HPI, otherwise negative ROS  Physical Exam: Vital signs in last 24 hours: Temp:  [97.9 F (36.6 C)] 97.9 F (36.6 C) (08/01 0934) Pulse Rate:  [70] 70 (08/01 0934) Resp:  [20] 20 (08/01 0934) BP: (146)/(71) 146/71 (08/01 0934) SpO2:  [100 %] 100 % (08/01 0934) Weight:  [71.7 kg] 71.7 kg (08/01 0919)   General:   Alert,  Well-developed, well-nourished, pleasant and cooperative in NAD Head:  Normocephalic and atraumatic. Eyes:  Sclera clear, no icterus.   Conjunctiva pink. Ears:  Normal auditory acuity. Nose:  No deformity, discharge,  or lesions. Mouth:  No deformity or lesions, dentition normal. Neck:  Supple; no masses or thyromegaly. Lungs:  Clear throughout to auscultation.   No wheezes, crackles, or rhonchi. No acute distress. Heart:   Regular rate and rhythm; no murmurs, clicks, rubs,  or gallops. Abdomen:  Soft, nontender and nondistended. No masses, hepatosplenomegaly or hernias noted. Normal bowel sounds, without guarding, and without rebound.   Msk:  Symmetrical without gross deformities. Normal posture. Extremities:  Without clubbing or edema. Neurologic:  Alert and  oriented x4;  grossly normal neurologically. Skin:  Intact without significant lesions or rashes. Cervical Nodes:  No significant cervical adenopathy. Psych:  Alert and cooperative. Normal mood and affect.  Impression/Plan: VOLANDA MANGINE is here for an EGD with dilation for dysphagia, epigastric pain, anemia.   The risks of the procedure including infection, bleed, or perforation as well as benefits, limitations, alternatives and imponderables have been reviewed with the patient. Questions have been answered. All parties agreeable.

## 2020-10-18 NOTE — Transfer of Care (Signed)
Immediate Anesthesia Transfer of Care Note  Patient: Andrea Simmons  Procedure(s) Performed: ESOPHAGOGASTRODUODENOSCOPY (EGD) WITH PROPOFOL BALLOON DILATION  Patient Location: Short Stay  Anesthesia Type:General  Level of Consciousness: awake  Airway & Oxygen Therapy: Patient Spontanous Breathing  Post-op Assessment: Report given to RN  Post vital signs: Reviewed  Last Vitals:  Vitals Value Taken Time  BP    Temp    Pulse    Resp    SpO2      Last Pain:  Vitals:   10/18/20 0934  TempSrc: Oral  PainSc:       Patients Stated Pain Goal: 4 (83/16/74 2552)  Complications: No notable events documented.

## 2020-10-18 NOTE — Progress Notes (Signed)
Per Dr. Abbey Chatters, instructed pt and spouse to resume blood thinners today per prescribing doctor.  Both verbalized understanding stating they will call the doctor who manages her coumadin/lovenox today.

## 2020-10-18 NOTE — Discharge Instructions (Addendum)
EGD Discharge instructions Please read the instructions outlined below and refer to this sheet in the next few weeks. These discharge instructions provide you with general information on caring for yourself after you leave the hospital. Your doctor may also give you specific instructions. While your treatment has been planned according to the most current medical practices available, unavoidable complications occasionally occur. If you have any problems or questions after discharge, please call your doctor. ACTIVITY You may resume your regular activity but move at a slower pace for the next 24 hours.  Take frequent rest periods for the next 24 hours.  Walking will help expel (get rid of) the air and reduce the bloated feeling in your abdomen.  No driving for 24 hours (because of the anesthesia (medicine) used during the test).  You may shower.  Do not sign any important legal documents or operate any machinery for 24 hours (because of the anesthesia used during the test).  NUTRITION Drink plenty of fluids.  You may resume your normal diet.  Begin with a light meal and progress to your normal diet.  Avoid alcoholic beverages for 24 hours or as instructed by your caregiver.  MEDICATIONS You may resume your normal medications unless your caregiver tells you otherwise.  WHAT YOU CAN EXPECT TODAY You may experience abdominal discomfort such as a feeling of fullness or "gas" pains.  FOLLOW-UP Your doctor will discuss the results of your test with you.  SEEK IMMEDIATE MEDICAL ATTENTION IF ANY OF THE FOLLOWING OCCUR: Excessive nausea (feeling sick to your stomach) and/or vomiting.  Severe abdominal pain and distention (swelling).  Trouble swallowing.  Temperature over 101 F (37.8 C).  Rectal bleeding or vomiting of blood.   You had a mild amount inflammation in your stomach.  I took biopsies of this to rule infection with a bacteria called H. pylori.  Await pathology results, my office will  contact you.  You also had a benign fibrous ring called a Schatzki's ring in your distal esophagus.  Likely this caused your difficulty swallowing.  I did stretch it with a balloon today.  Hopefully this helps.  Follow-up with GI as needed if symptoms return or worsen.  Resume Coumadin today and follow-up with prescribing doctor for Lovenox/Coumadin bridge.    I hope you have a great rest of your week!  Elon Alas. Abbey Chatters, D.O. Gastroenterology and Hepatology [facility]

## 2020-10-18 NOTE — Telephone Encounter (Signed)
Spoke with patient today about resuming warfarin and Lovenox after procedure today (8/1.)  8/1 - Take 1 1/2 tablets of warfarin this evening (Per GI).          (No Lovenox) today.  8/2 - Take 1 1/2 tablets of warfarin AND take Lovenox in          The AM and PM  8/3 - Take 1 tablet of warfarin AND take Lovenox in the          AM and PM  8/4 - Take Lovenox in the AM and check INR at the [facility] office at 10:00.  Patient verbalized understanding and read back all instructions.  [name], RN

## 2020-10-19 ENCOUNTER — Other Ambulatory Visit: Payer: Self-pay | Admitting: Family Medicine

## 2020-10-19 LAB — SURGICAL PATHOLOGY

## 2020-10-20 NOTE — Telephone Encounter (Signed)
Error

## 2020-10-21 ENCOUNTER — Ambulatory Visit
Admission: RE | Admit: 2020-10-21 | Discharge: 2020-10-21 | Disposition: A | Discharge status: Home / Self Care | Payer: Medicare PPO | Source: Ambulatory Visit | Attending: Family Medicine | Admitting: Family Medicine

## 2020-10-21 ENCOUNTER — Ambulatory Visit (INDEPENDENT_AMBULATORY_CARE_PROVIDER_SITE_OTHER): Payer: Medicare PPO | Admitting: General Practice

## 2020-10-21 ENCOUNTER — Other Ambulatory Visit: Payer: Self-pay

## 2020-10-21 DIAGNOSIS — I2699 Other pulmonary embolism without acute cor pulmonale: Secondary | ICD-10-CM | POA: Diagnosis not present

## 2020-10-21 DIAGNOSIS — Z1231 Encounter for screening mammogram for malignant neoplasm of breast: Secondary | ICD-10-CM | POA: Diagnosis not present

## 2020-10-21 DIAGNOSIS — Z7901 Long term (current) use of anticoagulants: Secondary | ICD-10-CM | POA: Diagnosis not present

## 2020-10-21 LAB — POCT INR: INR: 1.8 — AB (ref 2.0–3.0)

## 2020-10-21 NOTE — Progress Notes (Signed)
Medical screening examination/treatment/procedure(s) were performed by non-physician practitioner and as supervising physician I was immediately available for consultation/collaboration. I agree with above. Linsie Lupo, MD   

## 2020-10-21 NOTE — Patient Instructions (Addendum)
Pre visit review using our clinic review tool, if applicable. No additional management support is needed unless otherwise documented below in the visit note.  Take 1 tablet today (8/4) AND take Lovenox shot today.  On Friday continue to take 1/2 tablet all days except 1 tablet on Tuesdays and Saturdays.  Re-check in 4 weeks.  Stop Lovenox after today.

## 2020-10-22 DIAGNOSIS — I10 Essential (primary) hypertension: Secondary | ICD-10-CM | POA: Diagnosis not present

## 2020-10-26 ENCOUNTER — Encounter (HOSPITAL_COMMUNITY): Payer: Self-pay | Admitting: Internal Medicine

## 2020-10-26 ENCOUNTER — Telehealth: Payer: Self-pay | Admitting: Internal Medicine

## 2020-10-26 DIAGNOSIS — M7022 Olecranon bursitis, left elbow: Secondary | ICD-10-CM | POA: Diagnosis not present

## 2020-10-26 DIAGNOSIS — E039 Hypothyroidism, unspecified: Secondary | ICD-10-CM | POA: Diagnosis not present

## 2020-10-26 DIAGNOSIS — D649 Anemia, unspecified: Secondary | ICD-10-CM | POA: Diagnosis not present

## 2020-10-26 NOTE — Telephone Encounter (Signed)
PLEASE CALL PATIENT IF THE RESULTS FROM THE PROCEDURE ARE READY

## 2020-10-29 ENCOUNTER — Encounter: Payer: Self-pay | Admitting: Surgical

## 2020-10-29 ENCOUNTER — Ambulatory Visit (INDEPENDENT_AMBULATORY_CARE_PROVIDER_SITE_OTHER): Payer: Medicare PPO

## 2020-10-29 ENCOUNTER — Other Ambulatory Visit: Payer: Self-pay

## 2020-10-29 ENCOUNTER — Ambulatory Visit (INDEPENDENT_AMBULATORY_CARE_PROVIDER_SITE_OTHER): Payer: Medicare PPO | Admitting: Surgical

## 2020-10-29 DIAGNOSIS — M25512 Pain in left shoulder: Secondary | ICD-10-CM | POA: Diagnosis not present

## 2020-10-29 DIAGNOSIS — M25522 Pain in left elbow: Secondary | ICD-10-CM | POA: Diagnosis not present

## 2020-10-29 DIAGNOSIS — M7022 Olecranon bursitis, left elbow: Secondary | ICD-10-CM | POA: Diagnosis not present

## 2020-10-31 MED ORDER — LIDOCAINE HCL 1 % IJ SOLN
3.0000 mL | INTRAMUSCULAR | Status: AC | PRN
  Administered 2020-10-29: 3 mL

## 2020-10-31 NOTE — Progress Notes (Addendum)
Office Visit Note   Patient: Andrea Simmons           Date of Birth: 1940/09/01           MRN: [id number] Visit Date: 10/29/2020 Requested by: [name], MD [address] [place],  [place] [address] PCP: [name], MD  Subjective: Chief Complaint  Patient presents with   Left Elbow - Follow-up    HPI: Andrea Simmons is a 80 y.o. female who presents to the office complaining of left elbow and left scapular pain.  Patient states that she has been having left elbow pain with swelling around the olecranon for about 1 week.  She has had pain over the last 4 weeks but only swelling for 1 week.  Denies any fevers, chills, night sweats, malaise.  Denies any history of redness around the swelling.  She does note a history of gout but she has only had gout attacks in her foot before.  She also reports shoulder blade pain in the left shoulder blade and posterior left shoulder.  No history of recent shoulder injury.  She does have history of cervical spine fusion from C5-C7.  Denies any difficulty lifting her shoulder or lifting with the shoulder but does note increased pain in the neck and in the left scapula with moving her neck around.  She has not taken any medication for pain.  Denies any radicular pain down the arm or any numbness/tingling that is new for her..                ROS: All systems reviewed are negative as they relate to the chief complaint within the history of present illness.  Patient denies fevers or chills.  Assessment & Plan: Visit Diagnoses:  1. Pain in left elbow   2. Left shoulder pain, unspecified chronicity   3. Olecranon bursitis of left elbow     Plan: Patient is a 80 year old female who presents complaining of left elbow pain and left shoulder blade pain.  She has what appears to be nonseptic olecranon bursitis with radiographs of the left elbow demonstrating an olecranon spur.  She does have history of gout but this is not really causing her  much in the way of pain.  Regardless, plan to aspirate to rule out gout and relieve the swelling, per patient's wishes.  She will use compression after the initial aspiration and she was counseled on the signs and symptoms of infection if her swelling returns.  Patient tolerated the aspiration well and about 10 cc of sanguinous fluid was aspirated.  No concern for septic bursitis based on appearance of fluid.  She also reports left scapular pain that has been bothering her for about the last month.  She does have history of cervical spine fusion and this was from C5-C7.  She has physical exam findings concerning for referred pain from the cervical spine with no significant positive findings on left shoulder exam that would be responsible for posterior pain.  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  If no improvement at that time, recommend proceeding with MRI of the cervical spine if exam at that time still is consistent with referred pain from cervical spine.  May be occult shoulder arthritis that  is not showing up on radiographs apparently, based on patient's lack of range of motion left shoulder compared with the right.  Follow-Up Instructions: No follow-ups on file.   Orders:  Orders Placed This Encounter  Procedures   Anaerobic and Aerobic Culture   Gram stain   XR Elbow Complete Left (3+View)   XR Shoulder Left   Synovial Fluid Analysis, Complete   Cell count + diff,  w/ cryst-synvl fld   No orders of the defined types were placed in this encounter.     Procedures: Hand/UE Inj for (Olecranon bursitis) on 10/29/2020 3:30 PM Indications: therapeutic, diagnostic and joint swelling Details: 22 G needle (Olecranon) approach Medications: 3 mL lidocaine 1 % Aspirate: 10 mL Outcome:  tolerated well, no immediate complications Procedure, treatment alternatives, risks and benefits explained, specific risks discussed. Immediately prior to procedure a time out was called to verify the correct patient, procedure, equipment, support staff and site/side marked as required. Patient was prepped and draped in the usual sterile fashion.      Clinical Data: No additional findings.  Objective: Vital Signs: There were no vitals taken for this visit.  Physical Exam:  Constitutional: Patient appears well-developed HEENT:  Head: Normocephalic Eyes:EOM are normal Neck: Normal range of motion Cardiovascular: Normal rate Pulmonary/chest: Effort normal Neurologic: Patient is alert Skin: Skin is warm Psychiatric: Patient has normal mood and affect  Ortho Exam: Ortho exam demonstrates left elbow with large amount of swelling and fluctuance.  There is no warmth to the swelling which is located over the olecranon.  No redness noted.  Not really much in the way of tenderness with only mild discomfort to palpation.  She is able to move her elbow joint without pain or difficulty.  0 degrees of elbow extension and greater than 90 degrees of elbow flexion.  Tenderness throughout the axial cervical spine.  She has increased pain in the neck with cervical spine range of motion actively.  She also has positive Spurling sign with radiation of pain from her neck to the left shoulder blade where most of her pain is.  Left shoulder with 4 degrees external rotation, 90 degrees abduction, 150 degrees forward flexion.  This compared with the right shoulder with 50 degrees external rotation, 120 degrees abduction, 175 degrees forward flexion.  Excellent rotator cuff strength of bilateral shoulders with no weakness of supra, infra, subscap.  Specialty Comments:  No specialty comments available.  Imaging: No results found.   PMFS History: Patient Active Problem List   Diagnosis Date Noted    Esophageal dysphagia 09/16/2020   Dehydration 09/16/2020   AKI (acute kidney injury) () 09/16/2020   Elevated lipase 09/16/2020   Hyperglycemia due to diabetes mellitus ([place]) 09/16/2020   Vitamin D deficiency 10/01/2019   Polypharmacy 10/01/2019   Major depressive disorder, recurrent episode, moderate ([place]) 10/01/2019   Leg edema 06/04/2019   Chest pain of uncertain etiology 85/27/7824   Hx of pulmonary embolus 03/30/2017   Physical exam 11/30/2016   Hyperlipidemia 12/26/2013   Encounter for therapeutic drug monitoring 04/16/2013   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   Long term (current) use of anticoagulants 06/30/2010   Exertional dyspnea 11/09/2008   Diabetes type 2, controlled ([place]) 08/27/2008   ANEMIA-NOS 08/27/2008   Seasonal and perennial allergic rhinitis 08/27/2008   PULMONARY NODULE 08/27/2008   Fibromyalgia 08/27/2008   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 ([place]) 03/20/2007   Hypothyroidism 03/20/2007   Essential hypertension 03/20/2007   Recurrent pulmonary  emboli ([place]) 03/20/2007   GERD 03/20/2007   ESOPHAGEAL STRICTURE 03/08/2007   Past Medical History:  Diagnosis Date   Anemia    Anxiety    Breast cancer ([place])    Depression    Diabetes mellitus type II    Fibromyalgia    GERD (gastroesophageal reflux disease)    Hyperlipidemia    Hypertension    Malignant neoplasm of breast (female), unspecified site 1993   L breast s/p mastectomy and tamoxifen x 25yrs   Other pulmonary embolism and infarction 2008 and 2009   chronic anticoag - LeB CC   Unspecified hypothyroidism     Family History  Problem Relation Age of Onset   Depression Other        Parent   Arthritis Other        Parent, Grandparent   Hypertension Other        Grandparent   Hyperlipidemia Other        [place]   Miscarriages / Stillbirths Other        Grandparent   Stroke Other        [place]   Cancer Maternal Uncle        prostate   Hypertension Maternal  Grandfather    Breast cancer Cousin    Breast cancer Cousin     Past Surgical History:  Procedure Laterality Date   APPENDECTOMY     BALLOON DILATION N/A 10/18/2020   Procedure: BALLOON DILATION;  Surgeon: Eloise Harman, DO;  Location: [facility];  Service: Endoscopy;  Laterality: N/A;   BIOPSY  10/18/2020   Procedure: BIOPSY;  Surgeon: Eloise Harman, DO;  Location: [facility];  Service: Endoscopy;;  gastric    BREAST BIOPSY Left 1993   BREAST BIOPSY Right 09/25/2014   stero. Benign   BREAST RECONSTRUCTION Left    CATARACT EXTRACTION     ESOPHAGOGASTRODUODENOSCOPY (EGD) WITH PROPOFOL N/A 10/18/2020   Procedure: ESOPHAGOGASTRODUODENOSCOPY (EGD) WITH PROPOFOL;  Surgeon: Eloise Harman, DO;  Location: [facility];  Service: Endoscopy;  Laterality: N/A;  11:00am   MASTECTOMY Left    ROTATOR CUFF REPAIR     SPINAL FUSION      x 2   TOTAL ABDOMINAL HYSTERECTOMY W/ BILATERAL SALPINGOOPHORECTOMY     TUBAL LIGATION     Social History   Occupational History   Occupation: Armed forces technical officer: RETIRED   Occupation: Emergency planning/management officer   Occupation: school bus driver  Tobacco Use   Smoking status: Never   Smokeless tobacco: Never  Scientific laboratory technician Use: Never used  Substance and Sexual Activity   Alcohol use: No    Alcohol/week: 0.0 standard drinks   Drug use: No   Sexual activity: Not on file

## 2020-11-03 ENCOUNTER — Other Ambulatory Visit: Payer: Self-pay

## 2020-11-03 ENCOUNTER — Ambulatory Visit: Payer: Medicare PPO

## 2020-11-03 DIAGNOSIS — M5416 Radiculopathy, lumbar region: Secondary | ICD-10-CM

## 2020-11-03 DIAGNOSIS — E039 Hypothyroidism, unspecified: Secondary | ICD-10-CM | POA: Diagnosis not present

## 2020-11-03 DIAGNOSIS — M7020 Olecranon bursitis, unspecified elbow: Secondary | ICD-10-CM | POA: Diagnosis not present

## 2020-11-03 DIAGNOSIS — M542 Cervicalgia: Secondary | ICD-10-CM

## 2020-11-03 DIAGNOSIS — N183 Chronic kidney disease, stage 3 unspecified: Secondary | ICD-10-CM | POA: Diagnosis not present

## 2020-11-03 DIAGNOSIS — E611 Iron deficiency: Secondary | ICD-10-CM | POA: Diagnosis not present

## 2020-11-04 ENCOUNTER — Encounter (HOSPITAL_COMMUNITY): Payer: Self-pay | Admitting: *Deleted

## 2020-11-04 ENCOUNTER — Emergency Department (HOSPITAL_COMMUNITY)
Admission: EM | Admit: 2020-11-04 | Discharge: 2020-11-04 | Disposition: A | Discharge status: Home / Self Care | Payer: Medicare PPO | Attending: Emergency Medicine | Admitting: Emergency Medicine

## 2020-11-04 ENCOUNTER — Other Ambulatory Visit: Payer: Self-pay

## 2020-11-04 DIAGNOSIS — T63441A Toxic effect of venom of bees, accidental (unintentional), initial encounter: Secondary | ICD-10-CM

## 2020-11-04 DIAGNOSIS — Z7901 Long term (current) use of anticoagulants: Secondary | ICD-10-CM | POA: Insufficient documentation

## 2020-11-04 DIAGNOSIS — S80869A Insect bite (nonvenomous), unspecified lower leg, initial encounter: Secondary | ICD-10-CM | POA: Insufficient documentation

## 2020-11-04 DIAGNOSIS — Z7984 Long term (current) use of oral hypoglycemic drugs: Secondary | ICD-10-CM | POA: Diagnosis not present

## 2020-11-04 DIAGNOSIS — E119 Type 2 diabetes mellitus without complications: Secondary | ICD-10-CM | POA: Diagnosis not present

## 2020-11-04 DIAGNOSIS — W57XXXA Bitten or stung by nonvenomous insect and other nonvenomous arthropods, initial encounter: Secondary | ICD-10-CM | POA: Insufficient documentation

## 2020-11-04 LAB — ANAEROBIC AND AEROBIC CULTURE
AER RESULT:: NO GROWTH
MICRO NUMBER:: 12237436
MICRO NUMBER:: 12237437
SPECIMEN QUALITY:: ADEQUATE
SPECIMEN QUALITY:: ADEQUATE

## 2020-11-04 LAB — SYNOVIAL FLUID ANALYSIS, COMPLETE
Basophils, %: 0 %
Eosinophils-Synovial: 0 % (ref 0–2)
Lymphocytes-Synovial Fld: 44 % (ref 0–74)
Monocyte/Macrophage: 30 % (ref 0–69)
Neutrophil, Synovial: 23 % (ref 0–24)
Synoviocytes, %: 3 % (ref 0–15)
WBC, Synovial: 517 cells/uL — ABNORMAL HIGH (ref <150)

## 2020-11-04 MED ORDER — HYDROCORTISONE 1 % EX CREA
TOPICAL_CREAM | Freq: Once | CUTANEOUS | Status: DC
  Filled 2020-11-04: qty 28

## 2020-11-04 MED ORDER — DIPHENHYDRAMINE HCL 25 MG PO CAPS
50.0000 mg | ORAL_CAPSULE | Freq: Once | ORAL | Status: AC
  Administered 2020-11-04: 50 mg via ORAL
  Filled 2020-11-04: qty 2

## 2020-11-04 NOTE — ED Triage Notes (Signed)
Pain in both legs bitten by yellow jackets a couple of hours ago

## 2020-11-04 NOTE — Discharge Instructions (Addendum)
Take Benadryl as needed as directed.  Apply cortisone cream to legs as directed.  Recheck with your primary care provider.

## 2020-11-04 NOTE — ED Provider Notes (Signed)
[facility] EMERGENCY DEPARTMENT Provider Note   CSN: [id number] Arrival date & time: 11/04/20  1649     History Chief Complaint  Patient presents with   Insect Bite    Andrea Simmons is a 80 y.o. female.  80 year old female with history of diabetes, PE (on Coumadin, recent INR 1.8), presents with irritation to her legs following yellowjacket sting today.  Patient states she was push mowing her yard when she was stung multiple times legs by yellow jackets.  EMS was called and several bees were removed from her legs.  Patient has tried icing her legs as well as applying Dermoplast spray without relief.  She denies vomiting, shortness of breath, wheezing or rash.  Reports irritation to her legs bilaterally without skin changes.  No other complaints or concerns.  Last tetanus less than 5 years ago.      Past Medical History:  Diagnosis Date   Anemia    Anxiety    Breast cancer ([place])    Depression    Diabetes mellitus type II    Fibromyalgia    GERD (gastroesophageal reflux disease)    Hyperlipidemia    Hypertension    Malignant neoplasm of breast (female), unspecified site 1993   L breast s/p mastectomy and tamoxifen x 74yr   Other pulmonary embolism and infarction 2008 and 2009   chronic anticoag - LeB CC   Unspecified hypothyroidism     Patient Active Problem List   Diagnosis Date Noted   Esophageal dysphagia 09/16/2020   Dehydration 09/16/2020   AKI (acute kidney injury) ([place] 09/16/2020   Elevated lipase 09/16/2020   Hyperglycemia due to diabetes mellitus ([place] 09/16/2020   Vitamin D deficiency 10/01/2019   Polypharmacy 10/01/2019   Major depressive disorder, recurrent episode, moderate ([place] 10/01/2019   Leg edema 06/04/2019   Chest pain of uncertain etiology 175/12/2583  Hx of pulmonary embolus 03/30/2017   Physical exam 11/30/2016   Hyperlipidemia 12/26/2013   Encounter for therapeutic drug monitoring 04/16/2013   Right bundle branch block and left anterior  fascicular block 11/27/2011   Long term (current) use of anticoagulants 06/30/2010   Exertional dyspnea 11/09/2008   Diabetes type 2, controlled ([place] 08/27/2008   ANEMIA-NOS 08/27/2008   Seasonal and perennial allergic rhinitis 08/27/2008   PULMONARY NODULE 08/27/2008   Fibromyalgia 08/27/2008   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 ([place] 03/20/2007   Hypothyroidism 03/20/2007   Essential hypertension 03/20/2007   Recurrent pulmonary emboli ([place] 03/20/2007   GERD 03/20/2007   ESOPHAGEAL STRICTURE 03/08/2007    Past Surgical History:  Procedure Laterality Date   APPENDECTOMY     BALLOON DILATION N/A 10/18/2020   Procedure: BALLOON DILATION;  Surgeon: CEloise Harman DO;  Location: [facility];  Service: Endoscopy;  Laterality: N/A;   BIOPSY  10/18/2020   Procedure: BIOPSY;  Surgeon: CEloise Harman DO;  Location: [facility] ENDO SUITE;  Service: Endoscopy;;  gastric    BREAST BIOPSY Left 1993   BREAST BIOPSY Right 09/25/2014   stero. Benign   BREAST RECONSTRUCTION Left    CATARACT EXTRACTION     ESOPHAGOGASTRODUODENOSCOPY (EGD) WITH PROPOFOL N/A 10/18/2020   Procedure: ESOPHAGOGASTRODUODENOSCOPY (EGD) WITH PROPOFOL;  Surgeon: CEloise Harman DO;  Location: [facility] ENDO SUITE;  Service: Endoscopy;  Laterality: N/A;  11:00am   MASTECTOMY Left    ROTATOR CUFF REPAIR     SPINAL FUSION      x 2   TOTAL ABDOMINAL HYSTERECTOMY W/ BILATERAL SALPINGOOPHORECTOMY     TUBAL  LIGATION       OB History   No obstetric history on file.     Family History  Problem Relation Age of Onset   Depression Other        Parent   Arthritis Other        Parent, Grandparent   Hypertension Other        Grandparent   Hyperlipidemia Other        [place]   Miscarriages / Stillbirths Other        Grandparent   Stroke Other        [place]   Cancer Maternal Uncle        prostate   Hypertension Maternal Grandfather    Breast cancer Cousin    Breast cancer Cousin     Social History   Tobacco Use    Smoking status: Never   Smokeless tobacco: Never  Vaping Use   Vaping Use: Never used  Substance Use Topics   Alcohol use: No    Alcohol/week: 0.0 standard drinks   Drug use: No    Home Medications Prior to Admission medications   Medication Sig Start Date End Date Taking? Authorizing Provider  ACCU-CHEK GUIDE test strip CHECK BLOOD SUGARS DAILY 10/20/20   [name], MD  Accu-Chek Softclix Lancets lancets USE AS INSTRUCTED TO TEST SUGARS TWICE A DAY 08/18/20   [name], MD  amLODipine (NORVASC) 10 MG tablet TAKE 1 TABLET BY MOUTH EVERY DAY Patient taking differently: Take 10 mg by mouth daily. 05/17/20   [name], Reynold Bowen, MD  Biotin 1000 MCG tablet Take 1,000 mcg by mouth daily.    [provider]  Blood Glucose Monitoring Suppl (ACCU-CHEK GUIDE) w/Device KIT 1 each by Does not apply route 2 (two) times daily. To test sugars. Dx. E11.9 06/23/19   [name], MD  Colchicine (MITIGARE) 0.6 MG CAPS Take 1 capsule by mouth 2 (two) times daily as needed. Patient taking differently: Take 1 capsule by mouth 2 (two) times daily as needed (gout). 09/16/20   [name], Legrand Como, MD  DULoxetine (CYMBALTA) 30 MG capsule Take 3 capsules (90 mg total) by mouth daily. 08/12/20   [name], Billey Co., MD  enoxaparin (LOVENOX) 60 MG/0.6ML injection Inject 0.6 mLs (60 mg total) into the skin daily. 10/11/20   Brunetta Genera, MD  furosemide (LASIX) 20 MG tablet TAKE 1 TABLET BY MOUTH AS NEEDED Patient taking differently: Take 20 mg by mouth daily as needed for edema. 08/27/19   [name], Reynold Bowen, MD  labetalol (NORMODYNE) 100 MG tablet Take 1 tablet (100 mg total) by mouth 2 (two) times daily. 09/13/20   [name], Reynold Bowen, MD  levothyroxine (SYNTHROID) 25 MCG tablet TAKE 1 TABLET BY MOUTH EVERY DAY BEFORE BREAKFAST 10/20/20   [name], MD  Magnesium 500 MG TABS Take 500 mg by mouth daily.    [provider]  metFORMIN (GLUCOPHAGE-XR) 500 MG 24 hr tablet  Take 500 mg by mouth daily with breakfast. 03/23/16   [provider]  mirtazapine (REMERON) 7.5 MG tablet Take 1 tablet (7.5 mg total) by mouth at bedtime. 08/12/20   [name], Billey Co., MD  nitroGLYCERIN (NITROSTAT) 0.4 MG SL tablet Place 1 tablet (0.4 mg total) under the tongue every 5 (five) minutes as needed for chest pain. 10/31/18 03/03/21  [name], Reynold Bowen, MD  pantoprazole (PROTONIX) 40 MG tablet Take 1 tablet (40 mg total) by mouth 2 (two) times daily before a meal.  09/18/20 10/18/20  Manuella Ghazi, [name], DO  RESTASIS MULTIDOSE 0.05 % ophthalmic emulsion Place 1 drop into both eyes 2 (two) times daily.  06/21/16   [provider]  rosuvastatin (CRESTOR) 20 MG tablet Take 20 mg by mouth daily.    [provider]  valsartan (DIOVAN) 320 MG tablet Take 1 tablet (320 mg total) by mouth daily. 09/13/20   [name], Reynold Bowen, MD  Vitamin D, Ergocalciferol, (DRISDOL) 1.25 MG (50000 UNIT) CAPS capsule TAKE 1 CAPSULE BY MOUTH ONE TIME PER WEEK Patient taking differently: Take 50,000 Units by mouth every Sunday. 06/17/20   [name], Billey Co., MD  warfarin (COUMADIN) 5 MG tablet 42m on Sunday, 2.572mAll other days Patient taking differently: Take 2.5-5 mg by mouth See admin instructions. 79m38mn Saturday,Tuesday then take  2.79mg26ml other days 02/06/20   [name]    Allergies    Lipitor [atorvastatin], Morphine, Meperidine, and Naproxen sodium  Review of Systems   Review of Systems  Constitutional:  Negative for fever.  HENT:  Negative for trouble swallowing and voice change.   Respiratory:  Negative for cough, shortness of breath and wheezing.   Gastrointestinal:  Negative for vomiting.  Musculoskeletal:  Positive for myalgias.  Skin:  Negative for rash and wound.  Allergic/Immunologic: Positive for immunocompromised state.  Neurological:  Negative for weakness and numbness.  Hematological:  Bruises/bleeds easily.  All other systems reviewed and are  negative.  Physical Exam Updated Vital Signs BP (!) 152/84 (BP Location: Right Arm)   Pulse 87   Temp 98.3 F (36.8 C) (Oral)   Resp 15   SpO2 98%   Physical Exam Vitals and nursing note reviewed.  Constitutional:      General: She is not in acute distress.    Appearance: She is well-developed. She is not diaphoretic.  HENT:     Head: Normocephalic and atraumatic.  Cardiovascular:     Pulses: Normal pulses.  Pulmonary:     Effort: Pulmonary effort is normal.  Musculoskeletal:        General: No swelling or tenderness.     Right lower leg: No edema.     Left lower leg: No edema.  Skin:    General: Skin is warm and dry.     Findings: No erythema or rash.  Neurological:     Mental Status: She is alert and oriented to person, place, and time.     Sensory: No sensory deficit.     Motor: No weakness.     Gait: Gait normal.  Psychiatric:        Behavior: Behavior normal.    ED Results / Procedures / Treatments   Labs (all labs ordered are listed, but only abnormal results are displayed) Labs Reviewed - No data to display  EKG None  Radiology No results found.  Procedures Procedures   Medications Ordered in ED Medications  diphenhydrAMINE (BENADRYL) capsule 50 mg (has no administration in time range)  hydrocortisone cream 1 % (has no administration in time range)    ED Course  I have reviewed the triage vital signs and the nursing notes.  Pertinent labs & imaging results that were available during my care of the patient were reviewed by me and considered in my medical decision making (see chart for details).  Clinical Course as of 11/04/20 1833  Thu Aug 18, [id number] old female with irritation to her legs following yellowjacket stings which occurred several hours ago.  Has  tried home management without relief, has not taken anything but she is on Coumadin. Exam is reassuring, O2 sat 98% on room air without respiratory complaints.  Does not appear to be  anaphylaxis.  Plan is to apply cortisone cream and give patient Benadryl in the ER.  We will avoid steroids at this time as she has not having a significant reaction and is a diabetic.  Recommend recheck with her primary care provider. [LM]    Clinical Course User Index [LM] Roque Lias   MDM Rules/Calculators/A&P                           Final Clinical Impression(s) / ED Diagnoses Final diagnoses:  Bee sting, accidental or unintentional, initial encounter    Rx / DC Orders ED Discharge Orders     None        Tacy Learn, PA-C 11/04/20 1833    Wyvonnia Dusky, MD 11/05/20 [phone]

## 2020-11-10 ENCOUNTER — Ambulatory Visit: Payer: Medicare PPO | Admitting: Family Medicine

## 2020-11-10 ENCOUNTER — Other Ambulatory Visit: Payer: Self-pay

## 2020-11-10 ENCOUNTER — Ambulatory Visit: Payer: Medicare PPO | Admitting: Orthopedic Surgery

## 2020-11-10 DIAGNOSIS — M542 Cervicalgia: Secondary | ICD-10-CM

## 2020-11-11 DIAGNOSIS — E119 Type 2 diabetes mellitus without complications: Secondary | ICD-10-CM | POA: Diagnosis not present

## 2020-11-11 DIAGNOSIS — H04123 Dry eye syndrome of bilateral lacrimal glands: Secondary | ICD-10-CM | POA: Diagnosis not present

## 2020-11-13 ENCOUNTER — Encounter: Payer: Self-pay | Admitting: Orthopedic Surgery

## 2020-11-13 NOTE — Progress Notes (Signed)
Office Visit Note   Patient: Andrea Simmons           Date of Birth: 13-Jul-1940           MRN: [id number] Visit Date: 11/10/2020 Requested by: [name], MD [address] [place],  [place] [address] PCP: [name], MD  Subjective: Chief Complaint  Patient presents with   Other     F/u neck and elbow pain    HPI: Andrea Simmons is a 80 year old patient with left elbow pain and neck pain.  She had left elbow drained last clinic visit which showed no growth and no crystals.  She reports constant neck pain daily.  This has been going on for many months.  Has a history of prior injections in her neck.  Has a history of C-spine fusions x2 in the past.  Describes episodic radicular symptoms in both arms but no constant radicular symptoms.  r              ROS: All systems reviewed are negative as they relate to the chief complaint within the history of present illness.  Patient denies  fevers or chills.   Assessment & Plan: Visit Diagnoses:  1. Cervicalgia     Plan: Impression is left elbow and neck pain.  The elbow looks pretty reasonable.  Fluid has returned but no evidence of infection.  She does have diminished range of motion in the neck with history of 2 fusions.  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  Primarily in the facet joints.  Plan MRI cervical spine with possible ESI to follow..  Follow-Up Instructions: Return for after MRI.   Orders:  Orders Placed This Encounter  Procedures   MR Cervical Spine w/o contrast   No orders of the defined types were placed in this encounter.     Procedures: No procedures performed   Clinical Data: No additional findings.  Objective: Vital Signs: There were no vitals taken for this visit.  Physical Exam:   Constitutional: Patient appears well-developed HEENT:  Head: Normocephalic Eyes:EOM are normal Neck: Normal range of  motion Cardiovascular: Normal rate Pulmonary/chest: Effort normal Neurologic: Patient is alert Skin: Skin is warm Psychiatric: Patient has normal mood and affect   Ortho Exam: Ortho exam demonstrates 10 degrees of neck extension 30 degrees of flexion 30 degrees of rotation to the right 40 degrees rotation to the left.  Patient has 5 out of 5 grip EPL FPL interosseous wrist flexion wrist extension bicep triceps and deltoid strength.  Radial pulse intact bilaterally.  No muscle atrophy in either arm.  Reflexes symmetric bilateral biceps triceps.  Specialty Comments:  No specialty comments available.  Imaging: No results found.   PMFS History: Patient Active Problem List   Diagnosis Date Noted   Esophageal dysphagia 09/16/2020   Dehydration 09/16/2020   AKI (acute kidney injury) ([place]) 09/16/2020   Elevated lipase 09/16/2020   Hyperglycemia due to diabetes mellitus ([place]) 09/16/2020   Vitamin D deficiency 10/01/2019   Polypharmacy 10/01/2019   Major depressive disorder, recurrent episode, moderate ([place]) 10/01/2019   Leg edema 06/04/2019   Chest pain of uncertain etiology 81/82/9937   Hx of pulmonary embolus 03/30/2017   Physical exam 11/30/2016   Hyperlipidemia 12/26/2013   Encounter for therapeutic drug monitoring 04/16/2013   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   Long term (current) use  of anticoagulants 06/30/2010   Exertional dyspnea 11/09/2008   Diabetes type 2, controlled ([place]) 08/27/2008   ANEMIA-NOS 08/27/2008   Seasonal and perennial allergic rhinitis 08/27/2008   PULMONARY NODULE 08/27/2008   Fibromyalgia 08/27/2008   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 ([place]) 03/20/2007   Hypothyroidism 03/20/2007   Essential hypertension 03/20/2007   Recurrent pulmonary emboli ([place]) 03/20/2007   GERD 03/20/2007   ESOPHAGEAL STRICTURE 03/08/2007   Past Medical History:  Diagnosis Date   Anemia    Anxiety    Breast cancer ([place])     Depression    Diabetes mellitus type II    Fibromyalgia    GERD (gastroesophageal reflux disease)    Hyperlipidemia    Hypertension    Malignant neoplasm of breast (female), unspecified site 1993   L breast s/p mastectomy and tamoxifen x 48yrs   Other pulmonary embolism and infarction 2008 and 2009   chronic anticoag - LeB CC   Unspecified hypothyroidism     Family History  Problem Relation Age of Onset   Depression Other        Parent   Arthritis Other        Parent, Grandparent   Hypertension Other        Grandparent   Hyperlipidemia Other        [place]   Miscarriages / Stillbirths Other        Grandparent   Stroke Other        [place]   Cancer Maternal Uncle        prostate   Hypertension Maternal Grandfather    Breast cancer Cousin    Breast cancer Cousin     Past Surgical History:  Procedure Laterality Date   APPENDECTOMY     BALLOON DILATION N/A 10/18/2020   Procedure: BALLOON DILATION;  Surgeon: Eloise Harman, DO;  Location[facility];  Service: Endoscopy;  Laterality: N/A;   BIOPSY  10/18/2020   Procedure: BIOPSY;  Surgeon: Eloise Harman, DO;  Location[facility];  Service: Endoscopy;;  gastric    BREAST BIOPSY Left 1993   BREAST BIOPSY Right 09/25/2014   stero. Benign   BREAST RECONSTRUCTION Left    CATARACT EXTRACTION     ESOPHAGOGASTRODUODENOSCOPY (EGD) WITH PROPOFOL N/A 10/18/2020   Procedure: ESOPHAGOGASTRODUODENOSCOPY (EGD) WITH PROPOFOL;  Surgeon: Eloise Harman, DO;  Location[facility];  Service: Endoscopy;  Laterality: N/A;  11:00am   MASTECTOMY Left    ROTATOR CUFF REPAIR     SPINAL FUSION      x 2   TOTAL ABDOMINAL HYSTERECTOMY W/ BILATERAL SALPINGOOPHORECTOMY     TUBAL LIGATION     Social History   Occupational History   Occupation: Armed forces technical officer: RETIRED   Occupation: Emergency planning/management officer   Occupation: school bus driver  Tobacco Use   Smoking status: Never   Smokeless tobacco: Never  Scientific laboratory technician Use:  Never used  Substance and Sexual Activity   Alcohol use: No    Alcohol/week: 0.0 standard drinks   Drug use: No   Sexual activity: Not on file

## 2020-11-16 DIAGNOSIS — E7800 Pure hypercholesterolemia, unspecified: Secondary | ICD-10-CM | POA: Diagnosis not present

## 2020-11-16 DIAGNOSIS — M810 Age-related osteoporosis without current pathological fracture: Secondary | ICD-10-CM | POA: Diagnosis not present

## 2020-11-16 DIAGNOSIS — I10 Essential (primary) hypertension: Secondary | ICD-10-CM | POA: Diagnosis not present

## 2020-11-16 DIAGNOSIS — E039 Hypothyroidism, unspecified: Secondary | ICD-10-CM | POA: Diagnosis not present

## 2020-11-16 DIAGNOSIS — E1165 Type 2 diabetes mellitus with hyperglycemia: Secondary | ICD-10-CM | POA: Diagnosis not present

## 2020-11-16 DIAGNOSIS — G609 Hereditary and idiopathic neuropathy, unspecified: Secondary | ICD-10-CM | POA: Diagnosis not present

## 2020-11-17 ENCOUNTER — Other Ambulatory Visit: Payer: Self-pay | Admitting: Family Medicine

## 2020-11-17 DIAGNOSIS — Z791 Long term (current) use of non-steroidal anti-inflammatories (NSAID): Secondary | ICD-10-CM | POA: Diagnosis not present

## 2020-11-17 DIAGNOSIS — M810 Age-related osteoporosis without current pathological fracture: Secondary | ICD-10-CM | POA: Diagnosis not present

## 2020-11-17 DIAGNOSIS — M797 Fibromyalgia: Secondary | ICD-10-CM | POA: Diagnosis not present

## 2020-11-17 DIAGNOSIS — M150 Primary generalized (osteo)arthritis: Secondary | ICD-10-CM | POA: Diagnosis not present

## 2020-11-17 DIAGNOSIS — M79643 Pain in unspecified hand: Secondary | ICD-10-CM | POA: Diagnosis not present

## 2020-11-17 DIAGNOSIS — M25539 Pain in unspecified wrist: Secondary | ICD-10-CM | POA: Diagnosis not present

## 2020-11-17 DIAGNOSIS — M25439 Effusion, unspecified wrist: Secondary | ICD-10-CM | POA: Diagnosis not present

## 2020-11-17 DIAGNOSIS — F329 Major depressive disorder, single episode, unspecified: Secondary | ICD-10-CM | POA: Diagnosis not present

## 2020-11-17 DIAGNOSIS — M79644 Pain in right finger(s): Secondary | ICD-10-CM | POA: Diagnosis not present

## 2020-11-17 NOTE — Telephone Encounter (Signed)
Last INR performed on 10/21/20- 1.8  Any changes in medications. Please advise.

## 2020-11-22 DIAGNOSIS — I10 Essential (primary) hypertension: Secondary | ICD-10-CM | POA: Diagnosis not present

## 2020-11-25 ENCOUNTER — Ambulatory Visit (HOSPITAL_COMMUNITY): Payer: Medicare PPO | Attending: Surgical | Admitting: Physical Therapy

## 2020-11-25 ENCOUNTER — Other Ambulatory Visit: Payer: Self-pay

## 2020-11-25 ENCOUNTER — Encounter (HOSPITAL_COMMUNITY): Payer: Self-pay | Admitting: Physical Therapy

## 2020-11-25 DIAGNOSIS — R29898 Other symptoms and signs involving the musculoskeletal system: Secondary | ICD-10-CM | POA: Insufficient documentation

## 2020-11-25 DIAGNOSIS — R2689 Other abnormalities of gait and mobility: Secondary | ICD-10-CM | POA: Insufficient documentation

## 2020-11-25 DIAGNOSIS — M5416 Radiculopathy, lumbar region: Secondary | ICD-10-CM | POA: Diagnosis not present

## 2020-11-25 DIAGNOSIS — M542 Cervicalgia: Secondary | ICD-10-CM | POA: Diagnosis not present

## 2020-11-25 DIAGNOSIS — M6281 Muscle weakness (generalized): Secondary | ICD-10-CM | POA: Diagnosis not present

## 2020-11-25 NOTE — Assessment & Plan Note (Signed)
Cr was mildly elevated at 1.55 during admission.  She reports good fluid intake.  Check labs to assess if back to baseline.

## 2020-11-25 NOTE — Assessment & Plan Note (Signed)
Pt has hx of anemia.  Hgb was 10.2 at d/c.  Will repeat today to ensure this isn't falling and the cause of her fatigue.
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
## 2020-11-25 NOTE — Assessment & Plan Note (Signed)
Given pt's fatigue, will check TSH and adjust meds if needed.

## 2020-11-25 NOTE — Assessment & Plan Note (Signed)
EGD was postponed due to her coumadin use.  She has GI f/u pending and is on PPI BID.  She is tolerating a soft diet at this time.

## 2020-11-29 ENCOUNTER — Other Ambulatory Visit: Payer: Self-pay

## 2020-11-29 ENCOUNTER — Ambulatory Visit (INDEPENDENT_AMBULATORY_CARE_PROVIDER_SITE_OTHER): Payer: Medicare PPO

## 2020-11-29 ENCOUNTER — Ambulatory Visit
Admission: RE | Admit: 2020-11-29 | Discharge: 2020-11-29 | Disposition: A | Discharge status: Home / Self Care | Payer: Medicare PPO | Source: Ambulatory Visit | Attending: Orthopedic Surgery | Admitting: Orthopedic Surgery

## 2020-11-29 DIAGNOSIS — Z7901 Long term (current) use of anticoagulants: Secondary | ICD-10-CM | POA: Diagnosis not present

## 2020-11-29 DIAGNOSIS — M79645 Pain in left finger(s): Secondary | ICD-10-CM | POA: Diagnosis not present

## 2020-11-29 DIAGNOSIS — M797 Fibromyalgia: Secondary | ICD-10-CM | POA: Diagnosis not present

## 2020-11-29 DIAGNOSIS — M150 Primary generalized (osteo)arthritis: Secondary | ICD-10-CM | POA: Diagnosis not present

## 2020-11-29 DIAGNOSIS — F329 Major depressive disorder, single episode, unspecified: Secondary | ICD-10-CM | POA: Diagnosis not present

## 2020-11-29 DIAGNOSIS — M1990 Unspecified osteoarthritis, unspecified site: Secondary | ICD-10-CM | POA: Diagnosis not present

## 2020-11-29 DIAGNOSIS — M79643 Pain in unspecified hand: Secondary | ICD-10-CM | POA: Diagnosis not present

## 2020-11-29 DIAGNOSIS — M542 Cervicalgia: Secondary | ICD-10-CM | POA: Diagnosis not present

## 2020-11-29 DIAGNOSIS — M25539 Pain in unspecified wrist: Secondary | ICD-10-CM | POA: Diagnosis not present

## 2020-11-29 DIAGNOSIS — Z791 Long term (current) use of non-steroidal anti-inflammatories (NSAID): Secondary | ICD-10-CM | POA: Diagnosis not present

## 2020-11-29 DIAGNOSIS — M810 Age-related osteoporosis without current pathological fracture: Secondary | ICD-10-CM | POA: Diagnosis not present

## 2020-11-29 DIAGNOSIS — M4802 Spinal stenosis, cervical region: Secondary | ICD-10-CM | POA: Diagnosis not present

## 2020-11-29 LAB — POCT INR: INR: 1.9 — AB (ref 2.0–3.0)

## 2020-11-29 NOTE — Patient Instructions (Signed)
Pre visit review using our clinic review tool, if applicable. No additional management support is needed unless otherwise documented below in the visit note.  Increase dose today to 1 tablet and then change weekly dose to take 1/2 tablet daily except take 1 tablet on Mon, Wed, and Fri.  Re-check in 4 weeks.

## 2020-11-30 ENCOUNTER — Ambulatory Visit (HOSPITAL_COMMUNITY): Payer: Medicare PPO | Admitting: Physical Therapy

## 2020-11-30 DIAGNOSIS — R29898 Other symptoms and signs involving the musculoskeletal system: Secondary | ICD-10-CM | POA: Diagnosis not present

## 2020-11-30 DIAGNOSIS — M6281 Muscle weakness (generalized): Secondary | ICD-10-CM | POA: Diagnosis not present

## 2020-11-30 DIAGNOSIS — M542 Cervicalgia: Secondary | ICD-10-CM | POA: Diagnosis not present

## 2020-11-30 DIAGNOSIS — M5416 Radiculopathy, lumbar region: Secondary | ICD-10-CM | POA: Diagnosis not present

## 2020-11-30 DIAGNOSIS — R2689 Other abnormalities of gait and mobility: Secondary | ICD-10-CM | POA: Diagnosis not present

## 2020-11-30 NOTE — Therapy (Signed)
[place], [place], [address] Phone: [phone]   Fax:  [phone]  Physical Therapy Treatment  Patient Details  Name: Andrea Simmons MRN: [id number] Date of Birth: 1940/12/17 Referring Provider (PT): Gloriann Loan   Encounter Date: 11/30/2020   PT End of Session - 11/30/20 1656     Visit Number 2    Number of Visits 12    Date for PT Re-Evaluation 01/06/21    Authorization Type [employer]  medicare -auth put in    Progress Note Due on Visit 10    PT Start Time 1450    PT Stop Time 3532    PT Time Calculation (min) 40 min    Activity Tolerance Patient tolerated treatment well             Past Medical History:  Diagnosis Date   Anemia    Anxiety    Breast cancer ([place])    Depression    Diabetes mellitus type II    Fibromyalgia    GERD (gastroesophageal reflux disease)    Hyperlipidemia    Hypertension    Malignant neoplasm of breast (female), unspecified site 1993   L breast s/p mastectomy and tamoxifen x 37yrs   Other pulmonary embolism and infarction 2008 and 2009   chronic anticoag - [facility]   Unspecified hypothyroidism     Past Surgical History:  Procedure Laterality Date   APPENDECTOMY     BALLOON DILATION N/A 10/18/2020   Procedure: BALLOON DILATION;  Surgeon: Eloise Harman, DO;  [facility];  Service: Endoscopy;  Laterality: N/A;   BIOPSY  10/18/2020   Procedure: BIOPSY;  Surgeon: Eloise Harman, DO;  [facility];  Service: Endoscopy;;  gastric    BREAST BIOPSY Left 1993   BREAST BIOPSY Right 09/25/2014   stero. Benign   BREAST RECONSTRUCTION Left    CATARACT EXTRACTION     ESOPHAGOGASTRODUODENOSCOPY (EGD) WITH PROPOFOL N/A 10/18/2020   Procedure: ESOPHAGOGASTRODUODENOSCOPY (EGD) WITH PROPOFOL;  Surgeon: Eloise Harman, DO;  [facility];  Service: Endoscopy;  Laterality: N/A;  11:00am   MASTECTOMY Left    ROTATOR CUFF REPAIR     SPINAL FUSION       x 2   TOTAL ABDOMINAL HYSTERECTOMY W/ BILATERAL SALPINGOOPHORECTOMY     TUBAL LIGATION      There were no vitals filed for this visit.   Subjective Assessment - 11/30/20 1457     Subjective Pt states her neck creeks and hurts with cervical rotation and she stopped doing it because of this.    Currently in Pain? Yes    Pain Score 6     Pain Location Neck    Pain Orientation Left    Pain Descriptors / Indicators Aching    Pain Radiating Towards into shoulders, sometimes into forearms but beleives this is from fibromyalgia.                               [facility] Adult PT Treatment/Exercise - 11/30/20 0001       Neck Exercises: Seated   Neck Retraction 10 reps    Cervical Rotation Limitations painful, held    W Back 10 reps    Shoulder Rolls Backwards;15 reps                     PT Education - 11/30/20 1658  Education Details goal review and POC moving forward    Person(s) Educated Patient    Methods Explanation    Comprehension Verbalized understanding              PT Short Term Goals - 11/30/20 1658       PT SHORT TERM GOAL #1   Title Patient will be independent with HEP in order to improve functional outcomes.    Time 3    Period Weeks    Status On-going    Target Date 12/16/20      PT SHORT TERM GOAL #2   Title Pt to be able to turn her head at least 40 degrees in each direction to observe the environment she is in    Time 3    Period Weeks    Status On-going               PT Long Term Goals - 11/30/20 1658       PT LONG TERM GOAL #1   Title Patient will report at least 50% improvement in symptoms to allow her to get 4 hours of sleep on a consistant basis    Time 6    Period Weeks    Status On-going      PT LONG TERM GOAL #2   Title PT to be I in an advanced HEP to allow pt to be able to turn her head at least 50 degrees in each direction to be able to hold conversations without difficulty    Time 6     Period Weeks    Status On-going      PT LONG TERM GOAL #3   Title PT cervical pain to be no greater than a 6/10    Time 6    Period Weeks    Status On-going      PT LONG TERM GOAL #4   Title PT cervical strength to be increased one grade to allow pt to be able to put her dishes up without increased pain    Time 6    Period Weeks    Status On-going                   Plan - 11/30/20 1657     Clinical Impression Statement Reviewed goals and POC moving forward.  Added scapular retractions, cervical retractions, and shoulder rolls in seated. Instructed to hold off on rotations for now as those were increasing discomfort. Manual completed in sitting position to bil cervical mm.  Lt much tighter than Rt with multiple spasms.  Pt reported overall reduction in pain to 3/10.    Personal Factors and Comorbidities Age;Comorbidity 3+;Fitness;Time since onset of injury/illness/exacerbation    Comorbidities OA, fibromyalgia, fusions,    Examination-Activity Limitations Carry;Dressing;Lift;Sit    Examination-Participation Restrictions Cleaning;Community Activity;Laundry;Shop;Yard Work    [employer] officer Evolving/Moderate complexity    Rehab Potential Good    PT Frequency 2x / week    PT Duration 6 weeks    PT Treatment/Interventions Therapeutic exercise;Moist Heat;Patient/family education;Manual techniques;Passive range of motion    PT Next Visit Plan continue to promote cervical ROM while decreasing pain progress to strengthening and stability exercies.    PT Home Exercise Plan eval:  supine, cervical and scapular retraction, cervical rotation.             Patient will benefit from skilled therapeutic intervention in order to improve the following deficits and impairments:  Decreased activity tolerance, Decreased mobility, Decreased range of  motion, Decreased strength, Hypomobility, Increased fascial restricitons, Increased muscle spasms, Pain  Visit  Diagnosis: Cervicalgia  Muscle weakness (generalized)  Other symptoms and signs involving the musculoskeletal system  Other abnormalities of gait and mobility     Problem List Patient Active Problem List   Diagnosis Date Noted   Esophageal dysphagia 09/16/2020   Dehydration 09/16/2020   AKI (acute kidney injury) ([place]) 09/16/2020   Elevated lipase 09/16/2020   Hyperglycemia due to diabetes mellitus ([place]) 09/16/2020   Vitamin D deficiency 10/01/2019   Polypharmacy 10/01/2019   Major depressive disorder, recurrent episode, moderate ([place]) 10/01/2019   Leg edema 06/04/2019   Chest pain of uncertain etiology 41/05/129   Hx of pulmonary embolus 03/30/2017   Physical exam 11/30/2016   Hyperlipidemia 12/26/2013   Encounter for therapeutic drug monitoring 04/16/2013   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   Long term (current) use of anticoagulants 06/30/2010   Exertional dyspnea 11/09/2008   Diabetes type 2, controlled ([place]) 08/27/2008   ANEMIA-NOS 08/27/2008   Seasonal and perennial allergic rhinitis 08/27/2008   PULMONARY NODULE 08/27/2008   Fibromyalgia 08/27/2008   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 ([place]) 03/20/2007   Hypothyroidism 03/20/2007   Essential hypertension 03/20/2007   Recurrent pulmonary emboli ([place]) 03/20/2007   GERD 03/20/2007   ESOPHAGEAL STRICTURE 03/08/2007   Teena Irani, PTA/CLT [phone]  Teena Irani, PTA 11/30/2020, 5:00 PM  [place] [address] [place], [place], [address] Phone: [phone]   Fax:  [phone]  Name: Andrea Simmons MRN: [id number] Date of Birth: 10-28-1940

## 2020-12-02 ENCOUNTER — Telehealth: Payer: Self-pay | Admitting: Orthopedic Surgery

## 2020-12-02 DIAGNOSIS — M542 Cervicalgia: Secondary | ICD-10-CM

## 2020-12-02 NOTE — Telephone Encounter (Signed)
Patient called advised she had the MRI on Monday 11/29/2020 and would like the results called to her. Patient said someone called her but she didn't get to the phone in time. Patient said there was not a message left on voicemail.  The number to contact patient is [phone]

## 2020-12-03 ENCOUNTER — Ambulatory Visit (HOSPITAL_COMMUNITY): Payer: Medicare PPO

## 2020-12-03 ENCOUNTER — Other Ambulatory Visit: Payer: Self-pay

## 2020-12-03 DIAGNOSIS — R2689 Other abnormalities of gait and mobility: Secondary | ICD-10-CM | POA: Diagnosis not present

## 2020-12-03 DIAGNOSIS — M542 Cervicalgia: Secondary | ICD-10-CM | POA: Diagnosis not present

## 2020-12-03 DIAGNOSIS — R29898 Other symptoms and signs involving the musculoskeletal system: Secondary | ICD-10-CM

## 2020-12-03 DIAGNOSIS — M6281 Muscle weakness (generalized): Secondary | ICD-10-CM | POA: Diagnosis not present

## 2020-12-03 DIAGNOSIS — M5416 Radiculopathy, lumbar region: Secondary | ICD-10-CM | POA: Diagnosis not present
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
## 2020-12-03 NOTE — Telephone Encounter (Signed)
I called patient.  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  Unchanged from prior scan.  Fusion looks good.  Please set her up for  cervical spine ESI with Dr. Ernestina Patches thanks

## 2020-12-03 NOTE — Telephone Encounter (Signed)
Referral has been placed. 

## 2020-12-07 ENCOUNTER — Encounter (HOSPITAL_COMMUNITY): Payer: Medicare PPO

## 2020-12-07 ENCOUNTER — Encounter (HOSPITAL_COMMUNITY): Payer: Self-pay

## 2020-12-07 ENCOUNTER — Telehealth: Payer: Self-pay

## 2020-12-07 NOTE — Therapy (Signed)
[facility] [place], [place], [address] Phone: [phone]   Fax:  [phone]  Patient Details  Name: Andrea Simmons MRN: [id number] Date of Birth: 01/26/1941 Referring Provider:  No ref. provider found  Encounter Date: 12/07/2020 PHYSICAL THERAPY DISCHARGE SUMMARY  Visits from Start of Care: 3  Current functional level related to goals / functional outcomes: Pt unable to tolerate tx sessions due to c/o difficulty with fibromyalgia   Remaining deficits: Unable to determine   Education / Equipment: HEP initiated   Patient agrees to discharge. Patient goals were not met. Patient is being discharged due to the patient's request.  12:41 PM, 12/07/20 M. Sherlyn Lees, PT, DPT Physical Therapist- [facility] Office Number: [phone]   [facility] [address] [place], [place], [address] Phone: [phone]   Fax:  [phone]

## 2020-12-07 NOTE — Telephone Encounter (Signed)
Requesting for pt to stop her BT for five days for her appt  to receive an lumbar ESI.

## 2020-12-08 ENCOUNTER — Telehealth: Payer: Self-pay | Admitting: Physical Medicine and Rehabilitation

## 2020-12-08 NOTE — Telephone Encounter (Signed)
Pt called returning a missed for an appt from a referral. Pt won't be home this afternoon but will be free all day tomorrow.   [phone]

## 2020-12-09 ENCOUNTER — Encounter (HOSPITAL_COMMUNITY): Payer: Medicare PPO

## 2020-12-10 ENCOUNTER — Encounter: Payer: Self-pay | Admitting: Family Medicine

## 2020-12-10 ENCOUNTER — Ambulatory Visit: Payer: Medicare PPO | Admitting: Family Medicine

## 2020-12-10 ENCOUNTER — Other Ambulatory Visit: Payer: Self-pay

## 2020-12-10 VITALS — BP 122/82 | HR 82 | Temp 97.5°F | Resp 16 | Ht 64.0 in | Wt 159.2 lb

## 2020-12-10 DIAGNOSIS — R5383 Other fatigue: Secondary | ICD-10-CM

## 2020-12-10 DIAGNOSIS — R269 Unspecified abnormalities of gait and mobility: Secondary | ICD-10-CM

## 2020-12-10 DIAGNOSIS — F329 Major depressive disorder, single episode, unspecified: Secondary | ICD-10-CM | POA: Insufficient documentation

## 2020-12-10 DIAGNOSIS — G609 Hereditary and idiopathic neuropathy, unspecified: Secondary | ICD-10-CM | POA: Insufficient documentation

## 2020-12-10 DIAGNOSIS — M1990 Unspecified osteoarthritis, unspecified site: Secondary | ICD-10-CM | POA: Insufficient documentation

## 2020-12-10 HISTORY — DX: Unspecified abnormalities of gait and mobility: R26.9

## 2020-12-10 LAB — BASIC METABOLIC PANEL
BUN: 37 mg/dL — ABNORMAL HIGH (ref 6–23)
CO2: 23 mEq/L (ref 19–32)
Calcium: 9 mg/dL (ref 8.4–10.5)
Chloride: 108 mEq/L (ref 96–112)
Creatinine, Ser: 1.54 mg/dL — ABNORMAL HIGH (ref 0.40–1.20)
GFR: 32.11 mL/min — ABNORMAL LOW (ref >60.00)
Glucose, Bld: 92 mg/dL (ref 70–99)
Potassium: 5 mEq/L (ref 3.5–5.1)
Sodium: 138 mEq/L (ref 135–145)

## 2020-12-10 LAB — HEPATIC FUNCTION PANEL
ALT: 23 U/L (ref 0–35)
AST: 34 U/L (ref 0–37)
Albumin: 4.5 g/dL (ref 3.5–5.2)
Alkaline Phosphatase: 39 U/L (ref 39–117)
Bilirubin, Direct: 0.1 mg/dL (ref 0.0–0.3)
Total Bilirubin: 0.5 mg/dL (ref 0.2–1.2)
Total Protein: 6.9 g/dL (ref 6.0–8.3)

## 2020-12-10 LAB — CBC WITH DIFFERENTIAL/PLATELET
Basophils Absolute: 0 10*3/uL (ref 0.0–0.1)
Basophils Relative: 0.6 % (ref 0.0–3.0)
Eosinophils Absolute: 0.1 10*3/uL (ref 0.0–0.7)
Eosinophils Relative: 1.3 % (ref 0.0–5.0)
HCT: 34 % — ABNORMAL LOW (ref 36.0–46.0)
Hemoglobin: 11.3 g/dL — ABNORMAL LOW (ref 12.0–15.0)
Lymphocytes Relative: 20.3 % (ref 12.0–46.0)
Lymphs Abs: 1.4 10*3/uL (ref 0.7–4.0)
MCHC: 33.3 g/dL (ref 30.0–36.0)
MCV: 92.3 fl (ref 78.0–100.0)
Monocytes Absolute: 0.5 10*3/uL (ref 0.1–1.0)
Monocytes Relative: 7.4 % (ref 3.0–12.0)
Neutro Abs: 4.8 10*3/uL (ref 1.4–7.7)
Neutrophils Relative %: 70.4 % (ref 43.0–77.0)
Platelets: 186 10*3/uL (ref 150.0–400.0)
RBC: 3.68 Mil/uL — ABNORMAL LOW (ref 3.87–5.11)
RDW: 14.5 % (ref 11.5–15.5)
WBC: 6.8 10*3/uL (ref 4.0–10.5)

## 2020-12-10 LAB — HEMOGLOBIN A1C: Hgb A1c MFr Bld: 6.7 % — ABNORMAL HIGH (ref 4.6–6.5)

## 2020-12-10 LAB — B12 AND FOLATE PANEL
Folate: 5.8 ng/mL — ABNORMAL LOW (ref >5.9)
Vitamin B-12: 449 pg/mL (ref 211–911)

## 2020-12-10 LAB — VITAMIN D 25 HYDROXY (VIT D DEFICIENCY, FRACTURES): VITD: 54.33 ng/mL (ref 30.00–100.00)

## 2020-12-10 LAB — TSH: TSH: 3.88 u[IU]/mL (ref 0.35–5.50)

## 2020-12-10 NOTE — Patient Instructions (Addendum)
Follow up as needed or as scheduled We'll notify you of your lab results and make any changes if needed Increase your water intake Make sure you are eating regularly to avoid low sugars Use Voltaren Gel for neck and shoulder pain to provide relief (available OTC) Call with any questions or concerns Hang in there!!

## 2020-12-10 NOTE — Progress Notes (Signed)
   Subjective:    Patient ID: Andrea Simmons, female    DOB: 10-13-1940, 80 y.o.   MRN: [id number]  HPI Fatigue- pt's husband called and scheduled her this appt b/c 'i'm just so tired'.  Pt is concerned b/c Hgb was 10.7 in July.  She also has hypothyroid and is on Levothyroxine 53mcg daily.  Pt has gained 20 lbs since November.  Pt has hx of diabetes and A1C was well controlled at 6.5% on 6/30 on Metformin XR 500mg  daily.  Pt is not sleeping well due to chronic neck pain- ortho has recommended injxns.  Reports mood is good and feels her medications are appropriate (see Dr Clovis Pu).  Admits that she is very stressed about her husband and his medical issues.   Review of Systems For ROS see HPI   This visit occurred during the SARS-CoV-2 public health emergency.  Safety protocols were in place, including screening questions prior to the visit, additional usage of staff PPE, and extensive cleaning of exam room while observing appropriate contact time as indicated for disinfecting solutions.      Objective:   Physical Exam Vitals reviewed.  Constitutional:      General: She is not in acute distress.    Appearance: Normal appearance. She is well-developed. She is not ill-appearing.  HENT:     Head: Normocephalic and atraumatic.  Eyes:     Conjunctiva/sclera: Conjunctivae normal.     Pupils: Pupils are equal, round, and reactive to light.  Neck:     Thyroid: No thyromegaly.  Cardiovascular:     Rate and Rhythm: Normal rate and regular rhythm.     Pulses: Normal pulses.     Heart sounds: Normal heart sounds. No murmur heard. Pulmonary:     Effort: Pulmonary effort is normal. No respiratory distress.     Breath sounds: Normal breath sounds.  Abdominal:     General: There is no distension.     Palpations: Abdomen is soft.     Tenderness: There is no abdominal tenderness.  Musculoskeletal:     Cervical back: Normal range of motion and neck supple.     Right lower leg: No edema.     Left  lower leg: No edema.  Lymphadenopathy:     Cervical: No cervical adenopathy.  Skin:    General: Skin is warm and dry.  Neurological:     Mental Status: She is alert and oriented to person, place, and time. Mental status is at baseline.  Psychiatric:        Mood and Affect: Mood normal.        Behavior: Behavior normal.          Assessment & Plan:  Fatigue- likely multifactorial at this point.  She has multiple chronic health conditions including diabetes, hypothyroid, HTN, and major depression.  1 or all of those may be playing a role.  She is also very concerned about her husband's medical issues and is 'worrying herself to death'.  She is unable to sleep well due to chronic neck and back pain.  She has seen Ortho and they recommended injections but pt isn't sure she wants to proceed.  Encouraged Voltaren gel since she is unable to take NSAIDs due to her coumadin use.  Will check labs to determine if there is an actionable cause of her fatigue.  Will continue to follow closely to ensure improvement rather than deterioration.  Pt expressed understanding and is in agreement w/ plan.

## 2020-12-13 ENCOUNTER — Other Ambulatory Visit: Payer: Self-pay

## 2020-12-13 ENCOUNTER — Telehealth: Payer: Self-pay | Admitting: Physical Medicine and Rehabilitation

## 2020-12-13 DIAGNOSIS — N179 Acute kidney failure, unspecified: Secondary | ICD-10-CM

## 2020-12-13 NOTE — Telephone Encounter (Signed)
Patient called. Would like Shena to call her back. She missed a call from her.

## 2020-12-13 NOTE — Progress Notes (Signed)
Mp

## 2020-12-14 ENCOUNTER — Encounter (HOSPITAL_COMMUNITY): Payer: Medicare PPO | Admitting: Physical Therapy

## 2020-12-15 ENCOUNTER — Ambulatory Visit: Payer: Medicare PPO | Admitting: Psychiatry

## 2020-12-15 ENCOUNTER — Other Ambulatory Visit: Payer: Self-pay

## 2020-12-15 ENCOUNTER — Encounter: Payer: Self-pay | Admitting: Psychiatry

## 2020-12-15 DIAGNOSIS — F4001 Agoraphobia with panic disorder: Secondary | ICD-10-CM | POA: Diagnosis not present

## 2020-12-15 DIAGNOSIS — F331 Major depressive disorder, recurrent, moderate: Secondary | ICD-10-CM | POA: Diagnosis not present

## 2020-12-15 DIAGNOSIS — F411 Generalized anxiety disorder: Secondary | ICD-10-CM

## 2020-12-15 DIAGNOSIS — R7989 Other specified abnormal findings of blood chemistry: Secondary | ICD-10-CM | POA: Diagnosis not present

## 2020-12-15 DIAGNOSIS — F5105 Insomnia due to other mental disorder: Secondary | ICD-10-CM | POA: Diagnosis not present

## 2020-12-15 DIAGNOSIS — G4721 Circadian rhythm sleep disorder, delayed sleep phase type: Secondary | ICD-10-CM

## 2020-12-15 DIAGNOSIS — F338 Other recurrent depressive disorders: Secondary | ICD-10-CM

## 2020-12-15 MED ORDER — ESZOPICLONE 2 MG PO TABS: 2.0000 mg | ORAL_TABLET | Freq: Every evening | ORAL | 0 refills | Status: DC | PRN

## 2020-12-15 MED ORDER — DULOXETINE HCL 30 MG PO CPEP: 90.0000 mg | ORAL_CAPSULE | Freq: Every day | ORAL | 1 refills | Status: DC

## 2020-12-15 NOTE — Progress Notes (Signed)
Andrea Simmons [id number] 10/06/1940 80 y.o.  Subjective:   Patient ID:  Andrea Simmons is a 80 y.o. (DOB [birth year]) female.  Chief Complaint:  Chief Complaint  Patient presents with   Follow-up   Depression   Sleeping Problem    Depression        Associated symptoms include myalgias.  Associated symptoms include no decreased concentration, no appetite change and no suicidal ideas.  Andrea Simmons presents  today for recent exacerbation of depression..    At visit was July 09, 2018.  Vraylar was discontinued for lack of response.  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  This was partially helpful.  When  seen Aug 15, 2018 and Ritalin was increased to 15 mg twice daily because of partial response at the lower dose.  At visit and of July 2020.  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  The Ritalin was increased again to 20 mg twice daily.  seen December 04, 2018 & 02/2019.  Doesn't like winter.  Seasonal depression and crying more.  There were no med changes.   11/17/19 appt with the following noted: BP been higher and seeing Card and having med changes. Stopped Vit D bc level was high. Some discussion about the Ritalin over the BP control. Some increase depression in part over health issues.  No marital problems.  No SI.  Issues with daughter are a stressor.  D said she was mean when D was growing up and was too strict.  Felt D was ungrateful and that she and H did the best they could do.  I can't get over that and has told D about this. Ritalin with less energizing benefit and less productive than she was..  Wears off around supper time.  At night cannot break habit of snacks at night. Does not feel it kick in and wear off.  Back to old habits of staying up too late, now MN.  Needs 7-8 hours.  Always needed more than others.    I feel  good taking it.  Most days feels pretty good.  Can have crying spells without reason even before the Ritalin.   Doing much more than I was.   H still sick often too. Plan: Reduce Ritalin to 10 mg twice daily due to loss of benefit and blood pressure  Start change from duloxetine to Trintellix to help depression: Reduce duloxetine to 3 of 30 mg capsules and start Trintellix 5 mg daily for 1 week, Then reduce duloxetine to 2 of the 30 mg capsules and increase Trintellix to 10 mg daily for 1 week, Then increase Trintellix to 10 + 5 mg tablets and reduce duloxetine to 1 of the 30 mg capsules for 1 week, Then stop Duloxetine and increase Trintellix to 20 mg daily (or 2 of the 10 mg tablets)  01/14/2020 appointment with the following noted: Is on Trintellix 20 mg daily since 12/08/19 approximately. Was really hard with the switch.  Got really low and now some better.  Had a lot of aches and fatigue for awhile for 5 weeks.  Wonders if she was sick.  She doesn't feel like Trintellix is the right med. Gained 14# and still on low dose Ritalin.  Still having crying spells and poor energy but some days feels pretty good. No nausea or other SE noticed.  A lot of worry over her health and BP and H's cellulitis again. Plan: Overall patient has not improved with switch from duloxetine to Trintellix.  Her energy is not better and her mood is not better.  She is also quite anxious. Reduce Trintellix to 10 mg 1 daily and start viibryd 10 mg daily for a week, then  Stop Trintellix and increase Viibryd to 20 mg daily with food.  [place] 01/23/20 wanting to stop Ritalin so she did.  01/30/20 TC : LM:  Suggested she restart vitamin D 2000 units daily.  Her vitamin D level is a little low and we would like to see it in the 50s.  It is hoped that dose will push back into the 50s and if there is a question we will recheck it in 3 months. She has not been on Viibryd 20 mg long enough to see the full benefit of that.  She needs  to continue Viibryd 20 mg for at least a full 4 weeks at that dosage so another 3 weeks or so.  At that point we could consider increasing it if needed.   03/10/20 appt with following noted: Ritalin didn't help and might have increased BP per card. On Viibryd about 6 weeks at 20 mg. Read on viibryd  And concerned about taking with coumadin. Gotten where she can't sleep well.  Last week only averaging 3 hours per night.  Also irritable a lot in last 10 days. Gained 17#. Hungry. Worries about things more than in the past and some of it is age. [name] and Monday diarrhea.  Otherwise just at times. Plan: DC Ritalin per her request. Poor response so far with Viibryd. Increase Viibryd to 30 mg for 1 week, then 40 mg daily.  04/28/2020 appt with following noted: A lot better but far from where I need to be. Still depressed. Can't sleep.  Catnap.  Taking viibryd in morning 40 mg for 5 weeks. Increase Viibryd to 40 mg daily.   3-4 hours sleep and no naps.  Not drowsy daytime. Ongoing tiredness chronically. Feels more tense and irritable from not sleeping.  Plan: continue Viibryd 40  06/17/2020 appt with following noted: vIIBRYD not helping. Trazodone 25 mg hallucinated bugs and screamed. H not doing well and that hasn't helped mood. Body aches and hurts all over. Asks about return to Cymbalta. Still gaining weight. Broken sleep with total of 6-8 hours Plan: CO poor effect with Viibryd and duloxetine did help FM more and pain. Wean Viibryd and restart duloxetine and increase to 90 mg daily (*took 120 mg before) Reduce Viibryd to 20 mg daily and start 1 of the duloxetine 30 mg daily for 5 days, Then continue Viibryd 20 mg daily and increase duloxetine to 2 of the 30 mg daily for 5 days, Then reduce Viibryd to 10 mg daily and increase duloxetine to 3 of the 30 mg daily for 5 days, Then stop Viibryd and continue duloxetine 3 daily.  Rec trial mirtazapine 7.5 mg HS.  08/12/2020 appointment with  the following noted: I'm doing better with Cymbalta 90. "much, much better" Mirtazapine didn't work for [employer] but is sleeping now.  Laid down at MN and to sleep 2 A and up 845. Arthritis is bad and dx gout.  Joints in hands are bad.  Anxiety manageable. Depression got really bad before the switch in meds.  Pleased with result.  Seasonal depression starts in fall and until spring. Busy and worked with flowers. Pretty good now.  Plan no med changes.  Continue duloxetine 90 mg daily and mirtazapine for sleep  12/15/2020 appointment with the following noted: I'm doing really good.  Cymbalta is a success.  She's not worried about winter.   Ardyth Gal has not been well with cellulitis.  She's been through health problems.  Handles stressors well.   CO still poor sleep even with mirtazapine.  Will have 2-3 nights ok and other nights EFA.  Wants to sleep 11-8.  Seems she can't get relaxed and will worry at night.  Some nights pain interferes.   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   Past Psychiatric Medication Trials: Rozerem, zolpidem,  Trazodone severe SE, mirtazapine 7.5  Duloxetine 120, Paxil 20 for years, Sertraline, fluoxetine, Wellbutrin,   Trintellix 20 5 weeks NR.  Viibryd 40  NR,  Low dose nortriptyline, Abilify weight gain and loss benefit, Was More talkative on the Abilify and the Wellbutrin.  lithium 2004 SE tremor at 659m daily.   Vraylar NR, Buspar NR.  Review of Systems:  Review of Systems  Constitutional:  Negative for appetite change and unexpected weight change.  Cardiovascular:  Negative for palpitations.  Gastrointestinal:  Negative for diarrhea.  Musculoskeletal:  Positive for arthralgias, back pain, joint swelling and myalgias. Negative for neck stiffness.  Neurological:  Negative for tremors, weakness and numbness.  Psychiatric/Behavioral:  Positive for dysphoric mood. Negative for agitation, behavioral problems, confusion, decreased concentration,  hallucinations, self-injury, sleep disturbance and suicidal ideas. The patient is nervous/anxious. The patient is not hyperactive.   Hx anemia.    Cardiologist said she had a stiff heart.  History of pulmonary embolism and on Coumadin.  Medications: I have reviewed the patient's current medications.  Current Outpatient Medications  Medication Sig Dispense Refill   ACCU-CHEK GUIDE test strip CHECK BLOOD SUGARS DAILY 100 strip 12   Accu-Chek Softclix Lancets lancets USE AS INSTRUCTED TO TEST SUGARS TWICE A DAY 100 each 12   amLODipine (NORVASC) 10 MG tablet TAKE 1 TABLET BY MOUTH EVERY DAY (Patient taking differently: Take 10 mg by mouth daily.) 90 tablet 2   Biotin 1000 MCG tablet Take 1,000 mcg by mouth daily.     Blood Glucose Monitoring Suppl (ACCU-CHEK GUIDE) w/Device KIT 1 each by Does not apply route 2 (two) times daily. To test sugars. Dx. E11.9 1 kit 1   enoxaparin (LOVENOX) 60 MG/0.6ML injection Inject 0.6 mLs (60 mg total) into the skin daily. 12 mL 0   eszopiclone (LUNESTA) 2 MG TABS tablet Take 1 tablet (2 mg total) by mouth at bedtime as needed for sleep. Take immediately before bedtime 30 tablet 0   furosemide (LASIX) 20 MG tablet TAKE 1 TABLET BY MOUTH AS NEEDED (Patient taking differently: Take 20 mg by mouth daily as needed for edema.) 90 tablet 1   labetalol (NORMODYNE) 100 MG tablet Take 1 tablet (100 mg total) by mouth 2 (two) times daily. 180 tablet 2   levothyroxine (SYNTHROID) 25 MCG tablet TAKE 1 TABLET BY MOUTH EVERY DAY BEFORE BREAKFAST 90 tablet 1   Magnesium 500 MG TABS Take 500 mg by mouth daily.     metFORMIN (GLUCOPHAGE-XR) 500 MG 24 hr tablet Take 500 mg by mouth daily with breakfast.  3   mirtazapine (REMERON) 7.5 MG tablet Take 1 tablet (7.5 mg total) by mouth at bedtime. 90 tablet 1   RESTASIS MULTIDOSE 0.05 % ophthalmic emulsion Place 1 drop into both eyes 2 (two) times daily.   6  rosuvastatin (CRESTOR) 20 MG tablet Take 20 mg by mouth daily.     valsartan  (DIOVAN) 320 MG tablet Take 1 tablet (320 mg total) by mouth daily. 90 tablet 2   Vitamin D, Ergocalciferol, (DRISDOL) 1.25 MG (50000 UNIT) CAPS capsule TAKE 1 CAPSULE BY MOUTH ONE TIME PER WEEK (Patient taking differently: Take 50,000 Units by mouth every Sunday.) 12 capsule 1   warfarin (COUMADIN) 5 MG tablet TAKE 5 MG ON SUNDAY, THEN TAKE HALF A TABLET AFTERWARDS 70 tablet 2   Colchicine (MITIGARE) 0.6 MG CAPS Take 1 capsule by mouth 2 (two) times daily as needed. (Patient not taking: Reported on 12/15/2020) 60 capsule 3   DULoxetine (CYMBALTA) 30 MG capsule Take 3 capsules (90 mg total) by mouth daily. 270 capsule 1   nitroGLYCERIN (NITROSTAT) 0.4 MG SL tablet Place 1 tablet (0.4 mg total) under the tongue every 5 (five) minutes as needed for chest pain. (Patient not taking: Reported on 12/15/2020) 30 tablet 3   pantoprazole (PROTONIX) 40 MG tablet Take 1 tablet (40 mg total) by mouth 2 (two) times daily before a meal. 60 tablet 2   No current facility-administered medications for this visit.    Medication Side Effects: None talkative more the [facility]  Allergies:  Allergies  Allergen Reactions   Lipitor [Atorvastatin]     Unknown   Morphine Nausea And Vomiting   Meperidine Nausea And Vomiting   Naproxen Sodium Nausea And Vomiting    Past Medical History:  Diagnosis Date   Anemia    Anxiety    Breast cancer ([place])    Depression    Diabetes mellitus type II    Fibromyalgia    GERD (gastroesophageal reflux disease)    Hyperlipidemia    Hypertension    Malignant neoplasm of breast (female), unspecified site 1993   L breast s/p mastectomy and tamoxifen x 34yr   Other pulmonary embolism and infarction 2008 and 2009   chronic anticoag - [facility]   Unspecified hypothyroidism     Family History  Problem Relation Age of Onset   Depression Other        Parent   Arthritis Other        Parent, Grandparent   Hypertension Other        Grandparent   Hyperlipidemia Other        [place]   Miscarriages / Stillbirths Other        Grandparent   Stroke Other        FMess[employer]  Cancer Maternal Uncle        prostate   Hypertension Maternal Grandfather    Breast cancer Cousin    Breast cancer Cousin     Social History   Socioeconomic History   Marital status: Married    Spouse name: Not on file   Number of children: 1   Years of education: Not on file   Highest education level: Not on file  Occupational History   Occupation: tArmed forces technical officer RETIRED   Occupation: hEmergency planning/management officer  Occupation: school bus driver  Tobacco Use   Smoking status: Never   Smokeless tobacco: Never  VScientific laboratory technicianUse: Never used  Substance and Sexual Activity   Alcohol use: No    Alcohol/week: 0.0 standard drinks   Drug use: No   Sexual activity: Not on file  Other Topics Concern   Not on file  Social History Narrative   Not on file  Social Determinants of Health   Financial Resource Strain: Low Risk    Difficulty of Paying Living Expenses: Not hard at all  Food Insecurity: No Food Insecurity   Worried About Charity fundraiser in the Last Year: Never true   [place] in the Last Year: Never true  Transportation Needs: No Transportation Needs   Lack of Transportation (Medical): No   Lack of Transportation (Non-Medical): No  Physical Activity: Inactive   Days of Exercise per Week: 0 days   Minutes of Exercise per Session: 0 min  Stress: No Stress Concern Present   Feeling of Stress : Only a little  Social Connections: Moderately Integrated   Frequency of Communication with Friends and Family: More than three times a week   Frequency of Social Gatherings with Friends and Family: Once a week   Attends Religious Services: 1 to 4 times per year   Active Member of [employer] or Organizations: No   Attends Archivist Meetings: Never   Marital Status: Married  Human resources officer Violence: Not At Risk   Fear of Current or Ex-Partner: No   Emotionally  Abused: No   Physically Abused: No   Sexually Abused: No    Past Medical History, Surgical history, Social history, and Family history were reviewed and updated as appropriate.   Please see review of systems for further details on the patient's review from today.   Objective:   Physical Exam:  There were no vitals taken for this visit.  Physical Exam Constitutional:      General: She is not in acute distress.    Appearance: She is well-developed.  Musculoskeletal:        General: No deformity.  Neurological:     Mental Status: She is alert and oriented to person, place, and time.     Cranial Nerves: No dysarthria.     Coordination: Coordination normal.  Psychiatric:        Attention and Perception: Attention normal. She is attentive.        Mood and Affect: Mood is anxious and depressed. Affect is not labile, blunt, angry, tearful or inappropriate.        Speech: Speech normal. Speech is not rapid and pressured.        Behavior: Behavior normal. Behavior is cooperative.        Thought Content: Thought content normal. Thought content is not paranoid or delusional. Thought content does not include homicidal or suicidal ideation. Thought content does not include homicidal or suicidal plan.        Cognition and Memory: Cognition and memory normal.        Judgment: Judgment normal.     Comments: Insight fair-good.   Depression is better with duloxetine Talkative    Lab Review:     Component Value Date/Time   NA 138 12/10/2020 1325   NA 140 05/28/2019 1609   NA 141 08/01/2016 1527   K 5.0 12/10/2020 1325   K 4.6 08/01/2016 1527   CL 108 12/10/2020 1325   CO2 23 12/10/2020 1325   CO2 24 08/01/2016 1527   GLUCOSE 92 12/10/2020 1325   GLUCOSE 93 08/01/2016 1527   BUN 37 (H) 12/10/2020 1325   BUN 17 05/28/2019 1609   BUN 19.9 08/01/2016 1527   CREATININE 1.54 (H) 12/10/2020 1325   CREATININE 0.9 08/01/2016 1527   CALCIUM 9.0 12/10/2020 1325   CALCIUM 9.7 08/01/2016 1527    PROT 6.9 12/10/2020 1325   PROT  7.2 08/01/2016 1527   PROT 6.8 08/01/2016 1527   ALBUMIN 4.5 12/10/2020 1325   ALBUMIN 4.0 08/01/2016 1527   AST 34 12/10/2020 1325   AST 41 (H) 08/01/2016 1527   ALT 23 12/10/2020 1325   ALT 25 08/01/2016 1527   ALKPHOS 39 12/10/2020 1325   ALKPHOS 48 08/01/2016 1527   BILITOT 0.5 12/10/2020 1325   BILITOT 0.33 08/01/2016 1527   GFRNONAA 34 (L) 09/21/2020 1820   GFRAA 76 05/28/2019 1609       Component Value Date/Time   WBC 6.8 12/10/2020 1325   RBC 3.68 (L) 12/10/2020 1325   HGB 11.3 (L) 12/10/2020 1325   HGB 9.5 (L) 08/01/2016 1527   HCT 34.0 (L) 12/10/2020 1325   HCT 31.2 (L) 08/01/2016 1527   PLT 186.0 12/10/2020 1325   PLT 221 08/01/2016 1527   MCV 92.3 12/10/2020 1325   MCV 83.9 08/01/2016 1527   MCH 31.7 09/21/2020 1820   MCHC 33.3 12/10/2020 1325   RDW 14.5 12/10/2020 1325   RDW 17.2 (H) 08/01/2016 1527   LYMPHSABS 1.4 12/10/2020 1325   LYMPHSABS 2.0 08/01/2016 1527   MONOABS 0.5 12/10/2020 1325   MONOABS 0.4 08/01/2016 1527   EOSABS 0.1 12/10/2020 1325   EOSABS 0.1 08/01/2016 1527   BASOSABS 0.0 12/10/2020 1325   BASOSABS 0.0 08/01/2016 1527    No results found for: POCLITH, LITHIUM   No results found for: PHENYTOIN, PHENOBARB, VALPROATE, CBMZ   Endo checked thyroid lately.   Vitamin D level on April 01, 2018 reported as 40.  After that vitamin D 50,000 units weekly was prescribed with a goal of achieving levels in the 50s and 60s.  December 04, 2018 vitamin D 25 off supplement .res Assessment: Plan:    [place] was seen today for follow-up, depression and sleeping problem.  Diagnoses and all orders for this visit:  Major depressive disorder, recurrent episode, moderate (HCC) -     DULoxetine (CYMBALTA) 30 MG capsule; Take 3 capsules (90 mg total) by mouth daily.  Seasonal depression ([place]) -     DULoxetine (CYMBALTA) 30 MG capsule; Take 3 capsules (90 mg total) by mouth daily.  Panic disorder with  agoraphobia -     DULoxetine (CYMBALTA) 30 MG capsule; Take 3 capsules (90 mg total) by mouth daily.  Generalized anxiety disorder -     DULoxetine (CYMBALTA) 30 MG capsule; Take 3 capsules (90 mg total) by mouth daily.  Insomnia due to mental condition -     eszopiclone (LUNESTA) 2 MG TABS tablet; Take 1 tablet (2 mg total) by mouth at bedtime as needed for sleep. Take immediately before bedtime  Delayed sleep phase syndrome -     eszopiclone (LUNESTA) 2 MG TABS tablet; Take 1 tablet (2 mg total) by mouth at bedtime as needed for sleep. Take immediately before bedtime  Low vitamin D level    Greater than 50% of  30 min face to face time with patient was spent on counseling and coordination of care. Hx.TRD better again with duloxetine again.  We discussed options for dealing with this. Pt hx mixed sx so consider unconventional alternatives.  Discussed potential benefits, risks, and side effects of stimulants with patient to include increased heart rate, palpitations, insomnia, increased anxiety, increased irritability, or decreased appetite.  Instructed patient to contact office if experiencing any significant tolerability issues. She does not appear manic and not sig different from the last visit. Still talkative on it but not manic.  CO poor  effect with Viibryd and duloxetine did help FM more and pain. Better with switch back to duloxetine 90 mg daily (*took 120 mg before)  Continue mirtazapine 7.5 mg HS. Educated that she cannot expect to sleep solid through the night.  Explained.   Also disc sleep hygiene.   She's frustrated with sleep.  Trial of Bz bc worry is part of it but risk SE and risk tolerance. Trial Lunesta 2 mg HS DT age.  If works stop mirtazapine. She has chronic insomnia.  Option counseling but availability is limited for options. But on list for [facility]  Repeat vitamin D level was 47.  Previously high.  Restarted 2000 units daily.   Been off supplement since  July 2021    FU 3-4 mos  Needs to be seen in fall sometime  Lynder Parents, MD, DFAPA   Please see After Visit Summary for patient specific instructions.  Future Appointments  Date Time Provider [place]  12/16/2020  1:15 PM Nigel Mormon, MD [facility] None  12/22/2020  3:00 PM Magnus Sinning, MD [facility] None  12/27/2020 11:30 AM [facility]  12/27/2020  1:15 PM [facility] COUMADIN [facility]  02/01/2021 12:30 PM [name], MD [facility]    No orders of the defined types were placed in this encounter.     -------------------------------

## 2020-12-16 ENCOUNTER — Ambulatory Visit: Payer: Medicare PPO | Admitting: Cardiology

## 2020-12-16 ENCOUNTER — Encounter (HOSPITAL_COMMUNITY): Payer: Medicare PPO

## 2020-12-16 ENCOUNTER — Encounter: Payer: Self-pay | Admitting: Cardiology

## 2020-12-16 VITALS — BP 128/63 | HR 60 | Resp 16 | Ht 64.0 in | Wt 162.0 lb

## 2020-12-16 DIAGNOSIS — I10 Essential (primary) hypertension: Secondary | ICD-10-CM | POA: Diagnosis not present

## 2020-12-16 DIAGNOSIS — N179 Acute kidney failure, unspecified: Secondary | ICD-10-CM | POA: Diagnosis not present

## 2020-12-16 MED ORDER — VALSARTAN 320 MG PO TABS: 160.0000 mg | ORAL_TABLET | Freq: Every day | ORAL | 0 refills | Status: DC

## 2020-12-16 MED ORDER — HYDRALAZINE HCL 50 MG PO TABS: 50.0000 mg | ORAL_TABLET | Freq: Three times a day (TID) | ORAL | 3 refills | Status: DC

## 2020-12-16 NOTE — Progress Notes (Signed)
Follow up visit  Subjective:   Andrea Simmons, female    DOB: [birth year], 80 y.o.   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  Patient feels generalized fatigue, denies any specific cardiac complaints such as chest pain shortness of breath.  She recently underwent EGD with dilation for dysphagia.  She was started on folic acid for anemia.  She was also told to be dehydrated.  She underwent recent lab work that showed elevated creatinine.  Home blood pressure log reviewed.  Average blood pressure is around 160/80 mmHg.  Current Outpatient Medications on File Prior to Visit  Medication Sig Dispense Refill   ACCU-CHEK GUIDE test strip CHECK BLOOD SUGARS DAILY 100 strip 12   Accu-Chek Softclix Lancets lancets USE AS INSTRUCTED TO TEST SUGARS TWICE A DAY 100 each 12   amLODipine (NORVASC) 10 MG tablet TAKE 1 TABLET BY MOUTH EVERY DAY (Patient taking differently: Take 10 mg by mouth daily.) 90 tablet 2   Biotin 1000 MCG tablet Take 1,000 mcg by mouth daily.     Blood Glucose Monitoring Suppl (ACCU-CHEK GUIDE) w/Device KIT 1 each by Does not apply route 2 (two) times daily. To test sugars. Dx. E11.9 1 kit 1   Colchicine (MITIGARE) 0.6 MG CAPS Take 1 capsule by mouth 2 (two) times daily as needed. (Patient not taking: Reported on 12/15/2020) 60 capsule 3   DULoxetine (CYMBALTA) 30 MG capsule Take 3 capsules (90 mg total) by mouth daily. 270 capsule 1   enoxaparin (LOVENOX) 60 MG/0.6ML injection Inject 0.6 mLs (60 mg total) into the skin daily. 12 mL 0   eszopiclone (LUNESTA) 2 MG TABS tablet Take 1 tablet (2 mg total) by mouth at bedtime as needed for sleep. Take immediately before bedtime 30 tablet 0    furosemide (LASIX) 20 MG tablet TAKE 1 TABLET BY MOUTH AS NEEDED (Patient taking differently: Take 20 mg by mouth daily as needed for edema.) 90 tablet 1   labetalol (NORMODYNE) 100 MG tablet Take 1 tablet (100 mg total) by mouth 2 (two) times daily. 180 tablet 2   levothyroxine (SYNTHROID) 25 MCG tablet TAKE 1 TABLET BY MOUTH EVERY DAY BEFORE BREAKFAST 90 tablet 1   Magnesium 500 MG TABS Take 500 mg by mouth daily.     metFORMIN (GLUCOPHAGE-XR) 500 MG 24 hr tablet Take 500 mg by mouth daily with breakfast.  3   mirtazapine (REMERON) 7.5 MG tablet Take 1 tablet (7.5 mg total) by mouth at bedtime. 90 tablet 1   nitroGLYCERIN (NITROSTAT) 0.4 MG SL tablet Place 1 tablet (0.4 mg total) under the tongue every 5 (five) minutes as needed for chest pain. (Patient not taking: Reported on 12/15/2020) 30 tablet 3   pantoprazole (PROTONIX) 40 MG tablet Take 1 tablet (40 mg total) by mouth 2 (two) times daily before a meal. 60 tablet 2   RESTASIS MULTIDOSE 0.05 % ophthalmic emulsion Place 1 drop into both eyes 2 (two) times daily.   6   rosuvastatin (CRESTOR) 20 MG tablet Take 20 mg by mouth daily.     valsartan (DIOVAN) 320 MG tablet Take 1 tablet (320 mg total) by mouth daily. 90 tablet 2   Vitamin D, Ergocalciferol, (DRISDOL) 1.25 MG (50000 UNIT) CAPS  capsule TAKE 1 CAPSULE BY MOUTH ONE TIME PER WEEK (Patient taking differently: Take 50,000 Units by mouth every Sunday.) 12 capsule 1   warfarin (COUMADIN) 5 MG tablet TAKE 5 MG ON SUNDAY, THEN TAKE HALF A TABLET AFTERWARDS 70 tablet 2   No current facility-administered medications on file prior to visit.    Cardiovascular studies:  EKG 09/21/2020:  Sinus bradycardia 49 bpm.   RBBB, LAFB  Lexiscan myoview stress test 01/30/2018: 1. The resting electrocardiogram demonstrated normal sinus rhythm, LAD, LAFB, RBBB and no resting arrhythmias.  The stress electrocardiogram was non-diagnostic due to pharmacologic stress. Stress symptoms included dyspnea. Resting  BP 166/86 and peak BP 202/88 mm Hg.  2. Myocardial perfusion imaging is normal. Overall left ventricular systolic function was normal without regional wall motion abnormalities. The left ventricular ejection fraction was 56%.  This is a low risk study.  Echocardiogram 01/28/2018:  Left ventricle cavity is normal in size. Moderate concentric hypertrophy of the left ventricle. Normal global wall motion. Doppler evidence of grade I (impaired) diastolic dysfunction, normal LAP. Calculated EF 55%. Left atrial cavity is mildly dilated. Mild (Grade I) aortic regurgitation. Mild (Grade I) mitral regrgitation. Trace tricuspid regurgitation. Inadequate tricuspid regurgitation jet to estimate pulmonary artery pressure. Normal right atrial pressure.  Recent labs: 9/23/20222: Glucose 92, BUN/Cr 37/1.54. EGFR 32. Na/K 138/5.0. Rest of the CMP normal H/H 11/34. MCV 92. Platelets 186 HbA1C 6.7% TSH 3.8 normal  01/26/2020: Glucose 99, BUN/Cr 25/0.93. EGFR 59. Na/K 139/4.3. AST 38. Rest of the CMP normal H/H 12/37. MCV 92. Platelets 195 HbA1C 6.7% Chol 140, TG 77, HDL 51, LDL 73 TSH 1.5 normal  05/28/2019: Glucose 119, BUN/Cr 17/0.85. EGFR 66. Na/K 140/4.5.   10-02/2019: Glucose 96, BUN/Cr 20/0.74. EGFR 79. Na/K 139/4.4. Rest of the CMP normal H/H 12/36. MCV 91. Platelets 247 Chol 133, TG 119, HDL 38, LDL 71 TSH 0.9 normal   10/17/2018: Glucose 98, BUN/Cr 20/0.8. EGFR >60. Na/K 140/4.2. Rest of the CMP normal   Review of Systems  Cardiovascular:  Negative for chest pain, dyspnea on exertion, leg swelling, palpitations and syncope.       Vitals:   12/16/20 1315  BP: 128/63  Pulse: 60  Resp: 16  SpO2: 99%      Objective:   Physical Exam Vitals and nursing note reviewed.  Constitutional:      General: She is not in acute distress. Neck:     Vascular: No JVD.  Cardiovascular:     Rate and Rhythm: Normal rate and regular rhythm.     Pulses: Intact distal pulses.     Heart sounds:  Normal heart sounds. No murmur heard.    Comments: Bilateral LE varcocities Pulmonary:     Effort: Pulmonary effort is normal.     Breath sounds: Normal breath sounds. No wheezing or rales.        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  Check BMP in 2 weeks and follow-up with PCP as scheduled in November 1 week. Encourage hydration.  Avoid NSAIDs. Continue labetalol 100 mg bid, amlodipine 10 mg daily.   F/u in 3 months.   Nigel Mormon, MD [facility] Pager: [phone] Office: [phone] If no answer Cell [phone]

## 2020-12-20 ENCOUNTER — Telehealth: Payer: Self-pay | Admitting: Pharmacist

## 2020-12-20 NOTE — Telephone Encounter (Signed)
Called and reviewed with pt regarding concerns of recent swelling since starting lunesta and hydralazine over the weekend. Pt unsure if lower extremity leg complains were due to recent medication changes vs. Something else. Pt was told to decrease her hydralazine 50 mg from TID to BID. Since starting BID, pt reports that she hasnt had any recurrence in her symptoms. BP has improved slightly since starting hydralazine with no recurrence of lower extremity swelling. Pt okay with continuing with hydralazine 50 mg BID and continue monitoring for recurrence of swelling concerns. Pt hasnt used her PRN lasix recently. Encouraged pt to elevate her legs when possible and wearing her compression stocking to help avoid further aggravation. Encourage pt to take her lasix as need for concerns of swelling in future.

## 2020-12-21 ENCOUNTER — Encounter (HOSPITAL_COMMUNITY): Payer: Medicare PPO | Admitting: Physical Therapy

## 2020-12-22 ENCOUNTER — Ambulatory Visit: Payer: Self-pay

## 2020-12-22 ENCOUNTER — Ambulatory Visit (INDEPENDENT_AMBULATORY_CARE_PROVIDER_SITE_OTHER): Payer: Medicare PPO | Admitting: Physical Medicine and Rehabilitation

## 2020-12-22 ENCOUNTER — Other Ambulatory Visit: Payer: Self-pay

## 2020-12-22 VITALS — BP 112/66 | HR 76

## 2020-12-22 DIAGNOSIS — M5412 Radiculopathy, cervical region: Secondary | ICD-10-CM | POA: Diagnosis not present

## 2020-12-22 DIAGNOSIS — I10 Essential (primary) hypertension: Secondary | ICD-10-CM | POA: Diagnosis not present

## 2020-12-22 MED ORDER — BETAMETHASONE SOD PHOS & ACET 6 (3-3) MG/ML IJ SUSP
12.0000 mg | Freq: Once | INTRAMUSCULAR | Status: AC
  Administered 2020-12-22: 12 mg

## 2020-12-22 NOTE — Patient Instructions (Signed)
Today and Tomorrow, take 5 mg daily, then resume your normal dosage. Please follow-up with PCP in one week for recheck of your PT/INR.      [employer] Discharge Instructions  *At any time if you have questions or concerns they can be answered by calling [phone]  All Patients: You may experience an increase in your symptoms for the first 2 days (it can take 2 days to 2 weeks for the steroid/cortisone to have its maximal effect). You may use ice to the site for the first 24 hours; 20 minutes on and 20 minutes off and may use heat after that time. You may resume and continue your current pain medications. If you need a refill please contact the prescribing physician. You may resume your medications if any were stopped for the procedure. You may shower but no swimming, tub bath or Jacuzzi for 24 hours. Please remove bandage after 4 hours. You may resume light activities as tolerated. If you had Spine Injection, you should not drive for the next 3 hours due to anesthetics used in the procedure. Please have someone drive for you.  *If you have had sedation, Valium, Xanax, or lorazepam: Do not drive or use public transportation for 24 hours, do not operating hazardous machinery or make important personal/business decisions for 24 hours.  POSSIBLE STEROID SIDE EFFECTS: If experienced these should only last for a short period. Change in menstrual flow  Edema in (swelling)  Increased appetite Skin flushing (redness)  Skin rash/acne  Thrush (oral) Vaginitis    Increased sweating  Depression Increased blood glucose levels Cramping and leg/calf  Euphoria (feeling happy)  POSSIBLE PROCEDURE SIDE EFFECTS: Please call our office if concerned. Increased pain Increased numbness/tingling  Headache Nausea/vomiting Hematoma (bruising/bleeding) Edema (swelling at the site) Weakness  Infection (red/drainage at site) Fever greater than 100.14F  *In the event of a headache after  epidural steroid injection: Drink plenty of fluids, especially water and try to lay flat when possible. If the headache does not get better after a few days or as always if concerned please call the office.

## 2020-12-22 NOTE — Progress Notes (Signed)
Pt state neck pain. Pt state turning and laying down makes the pain worse. Pt state she take over the counter pain meds to help ease her pain.  Numeric Pain Rating Scale and Functional Assessment Average Pain 6   In the last MONTH (on 0-10 scale) has pain interfered with the following?  1. General activity like being  able to carry out your everyday physical activities such as walking, climbing stairs, carrying groceries, or moving a chair?  Rating(9)   +Driver, +BT pt has stop BT for five days, -Dye Allergies.

## 2020-12-23 ENCOUNTER — Encounter (HOSPITAL_COMMUNITY): Payer: Medicare PPO | Admitting: Physical Therapy

## 2020-12-27 ENCOUNTER — Ambulatory Visit (INDEPENDENT_AMBULATORY_CARE_PROVIDER_SITE_OTHER): Payer: Medicare PPO

## 2020-12-27 ENCOUNTER — Other Ambulatory Visit (INDEPENDENT_AMBULATORY_CARE_PROVIDER_SITE_OTHER): Payer: Medicare PPO

## 2020-12-27 ENCOUNTER — Other Ambulatory Visit (HOSPITAL_COMMUNITY): Payer: Self-pay | Admitting: Cardiology

## 2020-12-27 ENCOUNTER — Other Ambulatory Visit: Payer: Self-pay

## 2020-12-27 DIAGNOSIS — I10 Essential (primary) hypertension: Secondary | ICD-10-CM | POA: Diagnosis not present

## 2020-12-27 DIAGNOSIS — Z7901 Long term (current) use of anticoagulants: Secondary | ICD-10-CM

## 2020-12-27 DIAGNOSIS — N179 Acute kidney failure, unspecified: Secondary | ICD-10-CM | POA: Diagnosis not present

## 2020-12-27 LAB — POCT INR: INR: 2.2 (ref 2.0–3.0)

## 2020-12-27 LAB — BASIC METABOLIC PANEL
BUN: 32 mg/dL — ABNORMAL HIGH (ref 6–23)
CO2: 25 mEq/L (ref 19–32)
Calcium: 9.4 mg/dL (ref 8.4–10.5)
Chloride: 104 mEq/L (ref 96–112)
Creatinine, Ser: 1.6 mg/dL — ABNORMAL HIGH (ref 0.40–1.20)
GFR: 30.66 mL/min — ABNORMAL LOW (ref >60.00)
Glucose, Bld: 136 mg/dL — ABNORMAL HIGH (ref 70–99)
Potassium: 4.1 mEq/L (ref 3.5–5.1)
Sodium: 139 mEq/L (ref 135–145)

## 2020-12-27 NOTE — Patient Instructions (Addendum)
Pre visit review using our clinic review tool, if applicable. No additional management support is needed unless otherwise documented below in the visit note.  Continue to take 1/2 tablet daily except take 1 tablet on Mon, Wed, and Fri.  Re-check in 4 weeks.  

## 2020-12-28 ENCOUNTER — Encounter (HOSPITAL_COMMUNITY): Payer: Medicare PPO

## 2020-12-28 ENCOUNTER — Other Ambulatory Visit: Payer: Self-pay

## 2020-12-28 DIAGNOSIS — N179 Acute kidney failure, unspecified: Secondary | ICD-10-CM

## 2020-12-28 LAB — BASIC METABOLIC PANEL
BUN/Creatinine Ratio: 20 (ref 12–28)
BUN: 30 mg/dL — ABNORMAL HIGH (ref 8–27)
CO2: 20 mmol/L (ref 20–29)
Calcium: 9.4 mg/dL (ref 8.7–10.3)
Chloride: 103 mmol/L (ref 96–106)
Creatinine, Ser: 1.47 mg/dL — ABNORMAL HIGH (ref 0.57–1.00)
Glucose: 118 mg/dL — ABNORMAL HIGH (ref 70–99)
Potassium: 4.6 mmol/L (ref 3.5–5.2)
Sodium: 139 mmol/L (ref 134–144)
eGFR: 36 mL/min/{1.73_m2} — ABNORMAL LOW (ref >59)

## 2020-12-28 NOTE — Progress Notes (Signed)
Andrea Simmons - 80 y.o. female MRN [id number]  Date of birth: Dec 21, 1940  Office Visit Note: Visit Date: 12/22/2020 PCP: [name], MD Referred by: [name], MD  Subjective: Chief Complaint  Patient presents with   Neck - Pain   HPI:  Andrea Simmons is a 80 y.o. female who comes in today at the request of Dr. Anderson Malta for planned Right C7-T1 Cervical Interlaminar epidural steroid injection with fluoroscopic guidance.  The patient has failed conservative care including home exercise, medications, time and activity modification.  This injection will be diagnostic and hopefully therapeutic.  Please see requesting physician notes for further details and justification. MRI reviewed with images and spine model.  MRI reviewed in the note below. Prior ACDF C5-6 and C6-7 with C3-4 mild stenosis due to disc protrusion and spondylosis and left disc protrusion at C7-T1 but no stenosis.  She was given detailed instruction per her managing physicians on re-starting the coumadin.    ROS Otherwise per HPI.  Assessment & Plan: Visit Diagnoses:    ICD-10-CM   1. Cervical radiculopathy  M54.12 XR C-ARM NO REPORT    Epidural Steroid injection    betamethasone acetate-betamethasone sodium phosphate (CELESTONE) injection 12 mg      Plan: No additional findings.   Meds & Orders:  Meds ordered this encounter  Medications   betamethasone acetate-betamethasone sodium phosphate (CELESTONE) injection 12 mg    Orders Placed This Encounter  Procedures   XR C-ARM NO REPORT   Epidural Steroid injection    Follow-up: Return if symptoms worsen or fail to improve.   Procedures: No procedures performed  Cervical Epidural Steroid Injection - Interlaminar Approach with Fluoroscopic Guidance  Patient: Andrea Simmons      Date of Birth: 07/24/1940 MRN: [id number] PCP: [name], MD      Visit Date: 12/22/2020   Universal Protocol:    Date/Time: 10/11/226:04  AM  Consent Given By: the patient  Position: PRONE  Additional Comments: Vital signs were monitored before and after the procedure. Patient was prepped and draped in the usual sterile fashion. The correct patient, procedure, and site was verified.   Injection Procedure Details:   Procedure diagnoses: Cervical radiculopathy [M54.12]    Meds Administered:  Meds ordered this encounter  Medications   betamethasone acetate-betamethasone sodium phosphate (CELESTONE) injection 12 mg     Laterality: Right  Location/Site: C7-T1  Needle: 3.5 in., 20 ga. Tuohy  Needle Placement: Paramedian epidural space  Findings:  -Comments: Excellent flow of contrast into the epidural space.  Procedure Details: Using a paramedian approach from the side mentioned above, the region overlying the inferior lamina was localized under fluoroscopic visualization and the soft tissues overlying this structure were infiltrated with 4 ml. of 1% Lidocaine without Epinephrine. A # 20 gauge, Tuohy needle was inserted into the epidural space using a paramedian approach.  The epidural space was localized using loss of resistance along with contralateral oblique bi-planar fluoroscopic views.  After negative aspirate for air, blood, and CSF, a 2 ml. volume of Isovue-250 was injected into the epidural space and the flow of contrast was observed. Radiographs were obtained for documentation purposes.   The injectate was administered into the level noted above.  Additional Comments:  The patient tolerated the procedure well Dressing: 2 x 2 sterile gauze and Band-Aid    Post-procedure details: Patient was observed during the procedure. Post-procedure instructions were reviewed.  Patient left the clinic in stable condition.  Clinical History: MRI CERVICAL SPINE WITHOUT CONTRAST   TECHNIQUE: Multiplanar, multisequence MR imaging of the cervical spine was performed. No intravenous contrast was administered.    COMPARISON:  02/25/2019   FINDINGS: Alignment: Trace anterolisthesis C6-C7, unchanged.   Vertebrae: Solid fusion across C5-C6 and C6-C7. No acute fracture cerclage wires are noted posteriorly at C6-C7, which causes artifact.   Cord: Normal cord signal and morphology.   Posterior Fossa, vertebral arteries, paraspinal tissues: Negative   Disc levels:   C2-C3: Left uncovertebral and facet arthropathy. No spinal canal stenosis. Moderate left neural foraminal narrowing.   C3-C4: Disc bulge with superimposed central protrusion, which indents the ventral cord. Right-greater-than-left uncovertebral and facet arthropathy. Mild spinal canal stenosis, unchanged. Moderate to severe bilateral neural foraminal narrowing, also unchanged.   C4-C5: Mild disc bulge. Uncovertebral and facet arthropathy. No spinal canal stenosis. Moderate bilateral neural foraminal narrowing.   C5-C6: Solid arthrodesis. No spinal canal stenosis or significant neural foraminal narrowing.   C6-C7: Trace anterolisthesis. Solid arthrodesis. No spinal canal stenosis or neural foraminal narrowing.   C7-T1: Disc bulge with superimposed left paracentral protrusion. Facet arthropathy. No spinal canal stenosis. Mild bilateral neural foraminal narrowing.   IMPRESSION: 1. C3-C4 mild spinal canal stenosis and moderate to severe bilateral neural foraminal narrowing, unchanged from prior exam. 2. C4-C5 moderate bilateral neural foraminal narrowing. 3. C2-C3 moderate left neural foraminal narrowing. 4. C7-T1 mild bilateral neural foraminal narrowing.     Electronically Signed   By: Merilyn Baba M.D.   On: 11/30/2020 11:22 -------  MRI LUMBAR SPINE WITHOUT CONTRAST   TECHNIQUE: Multiplanar, multisequence MR imaging of the lumbar spine was performed. No intravenous contrast was administered.   COMPARISON:  Lumbar radiographs 01/27/2019   FINDINGS: Segmentation:  Normal   Alignment: Mild retrolisthesis L1-2  and L2-3. 6 mm anterolisthesis L4-5   Vertebrae:  Normal bone marrow.  Negative for fracture or mass.   Conus medullaris and cauda equina: Conus extends to the T12-L1 level. Conus and cauda equina appear normal.   Paraspinal and other soft tissues: Negative for paraspinous mass or adenopathy. Negative for soft tissue edema.   Disc levels:   L1-2: Mild disc and mild facet degeneration.  Negative for stenosis   L2-3: Mild disc bulging. Bilateral facet hypertrophy. Negative for stenosis   L3-4: Mild disc degeneration and moderate facet degeneration. No significant stenosis.   L4-5: 6 mm anterolisthesis with severe facet degeneration. Moderate to severe central canal stenosis. Moderate subarticular stenosis bilaterally   L5-S1: Bilateral facet hypertrophy. Small right foraminal disc protrusion with impingement of the right L5 nerve root.   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     Electronically Signed By: Franchot Gallo M.D. On: 02/25/2019 15:24     Objective:  VS:  HT:    WT:   BMI:     BP:112/66  HR:76bpm  TEMP: ( )  RESP:  Physical Exam Vitals and nursing note reviewed.  Constitutional:      General: She is not in acute distress.    Appearance: Normal appearance. She is not ill-appearing.  HENT:     Head: Normocephalic and atraumatic.     Right Ear: External ear normal.     Left Ear: External ear normal.  Eyes:     Extraocular Movements: Extraocular movements intact.  Cardiovascular:     Rate and Rhythm: Normal rate.  Pulses: Normal pulses.  Musculoskeletal:     Cervical back: Tenderness present. No rigidity.     Right lower leg: No edema.     Left lower leg: No edema.     Comments: Patient has good strength in the upper extremities including 5 out of 5 strength in wrist extension long finger  flexion and APB.  There is no atrophy of the hands intrinsically.  There is a negative Hoffmann's test.   Lymphadenopathy:     Cervical: No cervical adenopathy.  Skin:    Findings: No erythema, lesion or rash.  Neurological:     General: No focal deficit present.     Mental Status: She is alert and oriented to person, place, and time.     Sensory: No sensory deficit.     Motor: No weakness or abnormal muscle tone.     Coordination: Coordination normal.  Psychiatric:        Mood and Affect: Mood normal.        Behavior: Behavior normal.     Imaging: No results found.

## 2020-12-28 NOTE — Procedures (Signed)
Cervical Epidural Steroid Injection - Interlaminar Approach with Fluoroscopic Guidance  Patient: Andrea Simmons      Date of Birth: Jan 04, 1941 MRN: [id number] PCP: [name], MD      Visit Date: 12/22/2020   Universal Protocol:    Date/Time: 10/11/226:04 AM  Consent Given By: the patient  Position: PRONE  Additional Comments: Vital signs were monitored before and after the procedure. Patient was prepped and draped in the usual sterile fashion. The correct patient, procedure, and site was verified.   Injection Procedure Details:   Procedure diagnoses: Cervical radiculopathy [M54.12]    Meds Administered:  Meds ordered this encounter  Medications   betamethasone acetate-betamethasone sodium phosphate (CELESTONE) injection 12 mg     Laterality: Right  Location/Site: C7-T1  Needle: 3.5 in., 20 ga. Tuohy  Needle Placement: Paramedian epidural space  Findings:  -Comments: Excellent flow of contrast into the epidural space.  Procedure Details: Using a paramedian approach from the side mentioned above, the region overlying the inferior lamina was localized under fluoroscopic visualization and the soft tissues overlying this structure were infiltrated with 4 ml. of 1% Lidocaine without Epinephrine. A # 20 gauge, Tuohy needle was inserted into the epidural space using a paramedian approach.  The epidural space was localized using loss of resistance along with contralateral oblique bi-planar fluoroscopic views.  After negative aspirate for air, blood, and CSF, a 2 ml. volume of Isovue-250 was injected into the epidural space and the flow of contrast was observed. Radiographs were obtained for documentation purposes.   The injectate was administered into the level noted above.  Additional Comments:  The patient tolerated the procedure well Dressing: 2 x 2 sterile gauze and Band-Aid    Post-procedure details: Patient was observed during the  procedure. Post-procedure instructions were reviewed.  Patient left the clinic in stable condition.

## 2020-12-30 ENCOUNTER — Encounter (HOSPITAL_COMMUNITY): Payer: Medicare PPO | Admitting: Physical Therapy

## 2021-01-04 ENCOUNTER — Encounter: Payer: Self-pay | Admitting: Family Medicine

## 2021-01-05 ENCOUNTER — Other Ambulatory Visit: Payer: Self-pay

## 2021-01-05 ENCOUNTER — Telehealth: Payer: Self-pay | Admitting: Pharmacist

## 2021-01-05 DIAGNOSIS — I10 Essential (primary) hypertension: Secondary | ICD-10-CM

## 2021-01-05 NOTE — Telephone Encounter (Signed)
Agree with echo and closer f/u. If symptoms severe should go to urgent care/ER.  Thanks MJP

## 2021-01-05 NOTE — Telephone Encounter (Signed)
c 

## 2021-01-05 NOTE — Telephone Encounter (Signed)
Needs appt for echo

## 2021-01-05 NOTE — Telephone Encounter (Signed)
Pt called concerned about increasing SOB and dyspnea concerns over the past 2-3 weeks. Reports feeling out of breath with minimal activity. Denies any chest pain, tightness, coughing, wheezing complains. Denies any lightheadedness, dizziness, palpitations. Denies any recent lower extremity swelling. Pt does note to have recent weight gain over the past year or so. Reports to have gained ~30 lbs over the past year. Recent INR checked on 12/27/20 and was noted to be therapeutic.Reviewed with Dr. Virgina Jock, who recommended pt get an ECHO to further elucidate any cardiac vs non-cardiac reasons for pt's symptoms.

## 2021-01-07 ENCOUNTER — Telehealth: Payer: Self-pay

## 2021-01-07 NOTE — Telephone Encounter (Signed)
Caller name:Anastasya Munce   On DPR? :Yes  Call back number:[phone]  Provider they see: Birdie Riddle   Reason for call:Pt is calling back to let her know that she finally got appt with the kidney doctor and she also talked to her Cardiologist about her shortness of breath. She just wanted to make Dr Birdie Riddle aware

## 2021-01-07 NOTE — Telephone Encounter (Signed)
FYI per pt: Has an appt with Kidney specialist, and has discussed her SOB with Cardiology, no action required

## 2021-01-10 ENCOUNTER — Other Ambulatory Visit: Payer: Self-pay | Admitting: Psychiatry

## 2021-01-10 DIAGNOSIS — G4721 Circadian rhythm sleep disorder, delayed sleep phase type: Secondary | ICD-10-CM

## 2021-01-10 DIAGNOSIS — F5105 Insomnia due to other mental disorder: Secondary | ICD-10-CM

## 2021-01-11 ENCOUNTER — Ambulatory Visit: Payer: Medicare PPO

## 2021-01-11 ENCOUNTER — Other Ambulatory Visit: Payer: Self-pay

## 2021-01-11 DIAGNOSIS — I10 Essential (primary) hypertension: Secondary | ICD-10-CM | POA: Diagnosis not present

## 2021-01-20 ENCOUNTER — Ambulatory Visit: Payer: Medicare PPO | Admitting: Cardiology

## 2021-01-20 ENCOUNTER — Other Ambulatory Visit: Payer: Self-pay

## 2021-01-20 ENCOUNTER — Encounter: Payer: Self-pay | Admitting: Cardiology

## 2021-01-20 DIAGNOSIS — I10 Essential (primary) hypertension: Secondary | ICD-10-CM | POA: Diagnosis not present

## 2021-01-20 MED ORDER — LABETALOL HCL 200 MG PO TABS: 200.0000 mg | ORAL_TABLET | Freq: Two times a day (BID) | ORAL | 2 refills | Status: DC

## 2021-01-20 NOTE — Progress Notes (Signed)
Follow up visit  Subjective:   Andrea Simmons, female    DOB: [birth year], 80 y.o.   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 On specific questioning, she denies any chest pain or shortness of breath. She reports pain "around her kidneys", and reports difficulty falling asleep. Blood pressure normal today, but consistently elevated on home monitoring, avg SBP 152 bpm.  Reviewed recent labs with the patient, details below.  Current Outpatient Medications on File Prior to Visit  Medication Sig Dispense Refill   ACCU-CHEK GUIDE test strip CHECK BLOOD SUGARS DAILY 100 strip 12   Accu-Chek Softclix Lancets lancets USE AS INSTRUCTED TO TEST SUGARS TWICE A DAY 100 each 12   amLODipine (NORVASC) 10 MG tablet TAKE 1 TABLET BY MOUTH EVERY DAY (Patient taking differently: Take 10 mg by mouth daily.) 90 tablet 2   Biotin 1000 MCG tablet Take 1,000 mcg by mouth daily.     Blood Glucose Monitoring Suppl (ACCU-CHEK GUIDE) w/Device KIT 1 each by Does not apply route 2 (two) times daily. To test sugars. Dx. E11.9 1 kit 1   Colchicine (MITIGARE) 0.6 MG CAPS Take 1 capsule by mouth 2 (two) times daily as needed. 60 capsule 3   DULoxetine (CYMBALTA) 30 MG capsule Take 3 capsules (90 mg total) by mouth daily. 270 capsule 1   enoxaparin (LOVENOX) 60 MG/0.6ML injection Inject 0.6 mLs (60 mg total) into the skin daily. 12 mL 0   eszopiclone (LUNESTA) 2 MG TABS tablet TAKE 1 TABLET (2 MG TOTAL) BY MOUTH AT BEDTIME AS NEEDED FOR SLEEP. TAKE IMMEDIATELY BEFORE BEDTIME 30 tablet 2   furosemide (LASIX) 20 MG tablet TAKE 1 TABLET BY MOUTH AS NEEDED (Patient taking differently: Take 20 mg by mouth daily  as needed for edema.) 90 tablet 1   hydrALAZINE (APRESOLINE) 50 MG tablet Take 1 tablet (50 mg total) by mouth 3 (three) times daily. 90 tablet 3   labetalol (NORMODYNE) 100 MG tablet Take 1 tablet (100 mg total) by mouth 2 (two) times daily. 180 tablet 2   levothyroxine (SYNTHROID) 25 MCG tablet TAKE 1 TABLET BY MOUTH EVERY DAY BEFORE BREAKFAST 90 tablet 1   Magnesium 500 MG TABS Take 500 mg by mouth daily.     metFORMIN (GLUCOPHAGE-XR) 500 MG 24 hr tablet Take 500 mg by mouth daily with breakfast.  3   mirtazapine (REMERON) 7.5 MG tablet Take 1 tablet (7.5 mg total) by mouth at bedtime. 90 tablet 1   nitroGLYCERIN (NITROSTAT) 0.4 MG SL tablet Place 1 tablet (0.4 mg total) under the tongue every 5 (five) minutes as needed for chest pain. 30 tablet 3   RESTASIS MULTIDOSE 0.05 % ophthalmic emulsion Place 1 drop into both eyes 2 (two) times daily.   6   rosuvastatin (CRESTOR) 20 MG tablet Take 20 mg by mouth daily.     valsartan (DIOVAN) 320 MG tablet Take 0.5 tablets (160 mg total) by mouth daily. 1 tablet 0   Vitamin D, Ergocalciferol, (DRISDOL) 1.25 MG (50000 UNIT) CAPS capsule TAKE 1 CAPSULE BY MOUTH ONE TIME PER WEEK (Patient taking differently: Take 50,000 Units by mouth every Sunday.) 12 capsule 1   warfarin (COUMADIN) 5  MG tablet TAKE 5 MG ON SUNDAY, THEN TAKE HALF A TABLET AFTERWARDS 70 tablet 2   pantoprazole (PROTONIX) 40 MG tablet Take 1 tablet (40 mg total) by mouth 2 (two) times daily before a meal. 60 tablet 2   No current facility-administered medications on file prior to visit.    Cardiovascular studies:  Echocardiogram 01/11/2021:  Left ventricle cavity is normal in size. Moderate concentric hypertrophy  of the left ventricle. Normal global wall motion. Normal LV systolic  function with visual EF 55-60%. Doppler evidence of grade I (impaired)  diastolic dysfunction, normal LAP.  Trace MR, trace TR.  Normal right atrial pressure.  Previous study in 2019 reported mild LA  dilatation, mild MR, mild AI.  EKG 09/21/2020:  Sinus bradycardia 49 bpm.   RBBB, LAFB  Lexiscan myoview stress test 01/30/2018: 1. The resting electrocardiogram demonstrated normal sinus rhythm, LAD, LAFB, RBBB and no resting arrhythmias.  The stress electrocardiogram was non-diagnostic due to pharmacologic stress. Stress symptoms included dyspnea. Resting BP 166/86 and peak BP 202/88 mm Hg.  2. Myocardial perfusion imaging is normal. Overall left ventricular systolic function was normal without regional wall motion abnormalities. The left ventricular ejection fraction was 56%.  This is a low risk study.  Echocardiogram 01/28/2018:  Left ventricle cavity is normal in size. Moderate concentric hypertrophy of the left ventricle. Normal global wall motion. Doppler evidence of grade I (impaired) diastolic dysfunction, normal LAP. Calculated EF 55%. Left atrial cavity is mildly dilated. Mild (Grade I) aortic regurgitation. Mild (Grade I) mitral regrgitation. Trace tricuspid regurgitation. Inadequate tricuspid regurgitation jet to estimate pulmonary artery pressure. Normal right atrial pressure.  Recent labs: 12/27/2020: Glucose 118, BUN/Cr 30/1.47. EGFR 36. Na/K 139/4.6.   9/23/20222: Glucose 92, BUN/Cr 37/1.54. EGFR 32. Na/K 138/5.0. Rest of the CMP normal H/H 11/34. MCV 92. Platelets 186 HbA1C 6.7% TSH 3.8 normal  01/26/2020: Glucose 99, BUN/Cr 25/0.93. EGFR 59. Na/K 139/4.3. AST 38. Rest of the CMP normal H/H 12/37. MCV 92. Platelets 195 HbA1C 6.7% Chol 140, TG 77, HDL 51, LDL 73 TSH 1.5 normal  05/28/2019: Glucose 119, BUN/Cr 17/0.85. EGFR 66. Na/K 140/4.5.   10-02/2019: Glucose 96, BUN/Cr 20/0.74. EGFR 79. Na/K 139/4.4. Rest of the CMP normal H/H 12/36. MCV 91. Platelets 247 Chol 133, TG 119, HDL 38, LDL 71 TSH 0.9 normal   10/17/2018: Glucose 98, BUN/Cr 20/0.8. EGFR >60. Na/K 140/4.2. Rest of the CMP normal   Review of Systems  Constitutional: Positive for  malaise/fatigue.       Difficulty sleeping   Cardiovascular:  Negative for chest pain, dyspnea on exertion, leg swelling, palpitations and syncope.  Musculoskeletal:  Positive for back pain.       Vitals:   01/20/21 1112  BP: 105/62  Pulse: 79  Resp: 16  Temp: 98 F (36.7 C)  SpO2: 98%       Objective:   Physical Exam Vitals and nursing note reviewed.  Constitutional:      General: She is not in acute distress. Neck:     Vascular: No JVD.  Cardiovascular:     Rate and Rhythm: Normal rate and regular rhythm.     Pulses: Intact distal pulses.     Heart sounds: Normal heart sounds. No murmur heard.    Comments: Bilateral LE varcocities Pulmonary:     Effort: Pulmonary effort is normal.     Breath sounds: Normal breath sounds. No wheezing or rales.        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  Recently. I had reduced her valsartan from 320 mg to 160 mg, due to increase Cr. Cr improved but remains abnormal, with GFR of 36. She has upcoming appt to see nephrology next week. Continue hydralazine 50 mg bid. Also on amlodipine 10 mg daily. Increased labetalol to 200 mg bid.   F/u in 6 months.   Nigel Mormon, MD [facility] Pager: [phone] Office: [phone] If no answer Cell [phone]

## 2021-01-20 NOTE — Progress Notes (Signed)
Error

## 2021-01-22 ENCOUNTER — Encounter: Payer: Self-pay | Admitting: Physical Medicine and Rehabilitation

## 2021-01-22 ENCOUNTER — Encounter: Payer: Self-pay | Admitting: Family Medicine

## 2021-01-22 DIAGNOSIS — I10 Essential (primary) hypertension: Secondary | ICD-10-CM | POA: Diagnosis not present

## 2021-01-24 ENCOUNTER — Telehealth: Payer: Self-pay

## 2021-01-24 ENCOUNTER — Ambulatory Visit: Payer: Medicare PPO

## 2021-01-24 ENCOUNTER — Encounter: Payer: Self-pay | Admitting: Family Medicine

## 2021-01-24 DIAGNOSIS — M797 Fibromyalgia: Secondary | ICD-10-CM | POA: Diagnosis not present

## 2021-01-24 DIAGNOSIS — Z791 Long term (current) use of non-steroidal anti-inflammatories (NSAID): Secondary | ICD-10-CM | POA: Diagnosis not present

## 2021-01-24 DIAGNOSIS — M25539 Pain in unspecified wrist: Secondary | ICD-10-CM | POA: Diagnosis not present

## 2021-01-24 DIAGNOSIS — M1990 Unspecified osteoarthritis, unspecified site: Secondary | ICD-10-CM | POA: Diagnosis not present

## 2021-01-24 DIAGNOSIS — M3500 Sicca syndrome, unspecified: Secondary | ICD-10-CM | POA: Insufficient documentation

## 2021-01-24 DIAGNOSIS — Z23 Encounter for immunization: Secondary | ICD-10-CM | POA: Diagnosis not present

## 2021-01-24 DIAGNOSIS — M79643 Pain in unspecified hand: Secondary | ICD-10-CM | POA: Diagnosis not present

## 2021-01-24 DIAGNOSIS — M79645 Pain in left finger(s): Secondary | ICD-10-CM | POA: Diagnosis not present

## 2021-01-24 DIAGNOSIS — M150 Primary generalized (osteo)arthritis: Secondary | ICD-10-CM | POA: Diagnosis not present

## 2021-01-24 DIAGNOSIS — F329 Major depressive disorder, single episode, unspecified: Secondary | ICD-10-CM | POA: Diagnosis not present

## 2021-01-24 NOTE — Telephone Encounter (Signed)
Pt LVM that she could not make her apt today at 1:30. She reports right now she has a lot of things going on and will call back to RS the apt. She reports this nurse does not need to call her back.

## 2021-01-24 NOTE — Telephone Encounter (Signed)
From pt

## 2021-01-25 DIAGNOSIS — N189 Chronic kidney disease, unspecified: Secondary | ICD-10-CM | POA: Diagnosis not present

## 2021-01-25 DIAGNOSIS — E1122 Type 2 diabetes mellitus with diabetic chronic kidney disease: Secondary | ICD-10-CM | POA: Diagnosis not present

## 2021-01-25 DIAGNOSIS — Z86711 Personal history of pulmonary embolism: Secondary | ICD-10-CM | POA: Diagnosis not present

## 2021-01-25 DIAGNOSIS — I129 Hypertensive chronic kidney disease with stage 1 through stage 4 chronic kidney disease, or unspecified chronic kidney disease: Secondary | ICD-10-CM | POA: Diagnosis not present

## 2021-01-25 DIAGNOSIS — K219 Gastro-esophageal reflux disease without esophagitis: Secondary | ICD-10-CM | POA: Diagnosis not present

## 2021-01-25 DIAGNOSIS — E785 Hyperlipidemia, unspecified: Secondary | ICD-10-CM | POA: Diagnosis not present

## 2021-01-25 DIAGNOSIS — E039 Hypothyroidism, unspecified: Secondary | ICD-10-CM | POA: Diagnosis not present

## 2021-01-25 DIAGNOSIS — N1832 Chronic kidney disease, stage 3b: Secondary | ICD-10-CM | POA: Diagnosis not present

## 2021-01-25 DIAGNOSIS — N390 Urinary tract infection, site not specified: Secondary | ICD-10-CM | POA: Diagnosis not present

## 2021-01-25 HISTORY — DX: Chronic kidney disease, stage 3b: N18.32

## 2021-01-28 ENCOUNTER — Other Ambulatory Visit: Payer: Self-pay | Admitting: Pharmacist

## 2021-01-28 ENCOUNTER — Telehealth: Payer: Self-pay

## 2021-01-28 NOTE — Telephone Encounter (Signed)
Please advise 

## 2021-01-28 NOTE — Telephone Encounter (Signed)
Caller name: Kona Yusuf   On DPR? :yes  Call back number:[phone]  Provider they see: Birdie Riddle   Reason for call:Pt is calling wanting to know if Birdie Riddle got results from [facility] regarding her test results from this past Tuesday?

## 2021-01-31 ENCOUNTER — Ambulatory Visit (INDEPENDENT_AMBULATORY_CARE_PROVIDER_SITE_OTHER): Payer: Medicare PPO

## 2021-01-31 DIAGNOSIS — Z7901 Long term (current) use of anticoagulants: Secondary | ICD-10-CM

## 2021-01-31 LAB — POCT INR: INR: 4.5 — AB (ref 2.0–3.0)

## 2021-01-31 NOTE — Telephone Encounter (Signed)
Noted  

## 2021-01-31 NOTE — Progress Notes (Signed)
Hold dose today and hold dose tomorrow and then change weekly dose to take 1/2 tablet daily except take 1 tablet on Wednesdays. Re-check in 2 weeks.

## 2021-01-31 NOTE — Patient Instructions (Addendum)
Pre visit review using our clinic review tool, if applicable. No additional management support is needed unless otherwise documented below in the visit note. ? ?Hold dose today and hold dose tomorrow and then change weekly dose to take 1/2 tablet daily except take 1 tablet on Wednesdays. Recheck in 2 weeks.  ?

## 2021-01-31 NOTE — Telephone Encounter (Signed)
I will be on the lookout for it

## 2021-02-01 ENCOUNTER — Ambulatory Visit (INDEPENDENT_AMBULATORY_CARE_PROVIDER_SITE_OTHER): Payer: Medicare PPO | Admitting: Family Medicine

## 2021-02-01 ENCOUNTER — Encounter: Payer: Self-pay | Admitting: Family Medicine

## 2021-02-01 VITALS — BP 108/60 | HR 60 | Temp 97.7°F | Resp 16 | Ht 64.0 in | Wt 156.0 lb

## 2021-02-01 DIAGNOSIS — I129 Hypertensive chronic kidney disease with stage 1 through stage 4 chronic kidney disease, or unspecified chronic kidney disease: Secondary | ICD-10-CM | POA: Diagnosis not present

## 2021-02-01 DIAGNOSIS — Z0000 Encounter for general adult medical examination without abnormal findings: Secondary | ICD-10-CM | POA: Diagnosis not present

## 2021-02-01 DIAGNOSIS — I10 Essential (primary) hypertension: Secondary | ICD-10-CM

## 2021-02-01 DIAGNOSIS — E559 Vitamin D deficiency, unspecified: Secondary | ICD-10-CM

## 2021-02-01 LAB — CBC WITH DIFFERENTIAL/PLATELET
Basophils Absolute: 0 10*3/uL (ref 0.0–0.1)
Basophils Relative: 0.5 % (ref 0.0–3.0)
Eosinophils Absolute: 0.1 10*3/uL (ref 0.0–0.7)
Eosinophils Relative: 1 % (ref 0.0–5.0)
HCT: 32.6 % — ABNORMAL LOW (ref 36.0–46.0)
Hemoglobin: 10.9 g/dL — ABNORMAL LOW (ref 12.0–15.0)
Lymphocytes Relative: 13.1 % (ref 12.0–46.0)
Lymphs Abs: 0.9 10*3/uL (ref 0.7–4.0)
MCHC: 33.5 g/dL (ref 30.0–36.0)
MCV: 90.5 fl (ref 78.0–100.0)
Monocytes Absolute: 0.6 10*3/uL (ref 0.1–1.0)
Monocytes Relative: 8.5 % (ref 3.0–12.0)
Neutro Abs: 5.5 10*3/uL (ref 1.4–7.7)
Neutrophils Relative %: 76.9 % (ref 43.0–77.0)
Platelets: 221 10*3/uL (ref 150.0–400.0)
RBC: 3.61 Mil/uL — ABNORMAL LOW (ref 3.87–5.11)
RDW: 13.4 % (ref 11.5–15.5)
WBC: 7.2 10*3/uL (ref 4.0–10.5)

## 2021-02-01 LAB — LIPID PANEL
Cholesterol: 162 mg/dL (ref 0–200)
HDL: 43.2 mg/dL (ref >39.00)
LDL Cholesterol: 84 mg/dL (ref 0–99)
NonHDL: 118.94
Total CHOL/HDL Ratio: 4
Triglycerides: 177 mg/dL — ABNORMAL HIGH (ref 0.0–149.0)
VLDL: 35.4 mg/dL (ref 0.0–40.0)

## 2021-02-01 LAB — BASIC METABOLIC PANEL
BUN: 42 mg/dL — ABNORMAL HIGH (ref 6–23)
CO2: 24 mEq/L (ref 19–32)
Calcium: 9.5 mg/dL (ref 8.4–10.5)
Chloride: 104 mEq/L (ref 96–112)
Creatinine, Ser: 2.13 mg/dL — ABNORMAL HIGH (ref 0.40–1.20)
GFR: 21.73 mL/min — ABNORMAL LOW (ref >60.00)
Glucose, Bld: 101 mg/dL — ABNORMAL HIGH (ref 70–99)
Potassium: 4.4 mEq/L (ref 3.5–5.1)
Sodium: 138 mEq/L (ref 135–145)

## 2021-02-01 LAB — VITAMIN D 25 HYDROXY (VIT D DEFICIENCY, FRACTURES): VITD: 60.47 ng/mL (ref 30.00–100.00)

## 2021-02-01 LAB — TSH: TSH: 3.16 u[IU]/mL (ref 0.35–5.50)

## 2021-02-01 LAB — HEPATIC FUNCTION PANEL
ALT: 18 U/L (ref 0–35)
AST: 25 U/L (ref 0–37)
Albumin: 4.4 g/dL (ref 3.5–5.2)
Alkaline Phosphatase: 36 U/L — ABNORMAL LOW (ref 39–117)
Bilirubin, Direct: 0.1 mg/dL (ref 0.0–0.3)
Total Bilirubin: 0.5 mg/dL (ref 0.2–1.2)
Total Protein: 6.7 g/dL (ref 6.0–8.3)

## 2021-02-01 NOTE — Assessment & Plan Note (Signed)
Check labs and replete prn. 

## 2021-02-01 NOTE — Patient Instructions (Addendum)
Follow up in 6 months to recheck BP and cholesterol We'll notify you of your lab results and make any changes if needed DECREASE the Hydralazine to 25mg  (1/2 tab) TWICE daily Call with any questions or concerns Stay Safe!  Stay Healthy! Happy Holidays!!!

## 2021-02-01 NOTE — Assessment & Plan Note (Signed)
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  UTD on mammo, eye exam, foot exam.  No longer doing colonoscopies.  UTD on flu.  Due for Tdap but needs to get that at pharmacy.  Check labs.  Anticipatory guidance provided.

## 2021-02-01 NOTE — Assessment & Plan Note (Signed)
Pt's BP has been running low.  According to the note from [facility] they are worried that her elevated Cr is due to hypoperfusion.  They had recommended she decrease her Hydralazine from 50mg  TID to 25mg  BID.  Pt was unaware of this change so I made sure to tell her today.  Pt expressed understanding and it was written on her AVS.

## 2021-02-01 NOTE — Progress Notes (Signed)
Subjective:    Patient ID: Andrea Simmons, female    DOB: 1940-10-25, 80 y.o.   MRN: [id number]  HPI CPE- UTD on mammo, eye exam (calling for report).  UTD on flu.  UTD on foot exam- done 11/7.  On ARB for renal protection.  Patient Care Team    Relationship Specialty Notifications Start End  [name], MD PCP - General Family Medicine  04/20/16   Irene Shipper, MD Consulting Physician Gastroenterology  11/14/10   Deneise Lever, MD Consulting Physician Pulmonary Disease  11/14/10   Jacelyn Pi, MD Consulting Physician Endocrinology  11/14/10   Valinda Party, MD Consulting Physician Rheumatology  04/20/16   Purnell Shoemaker., MD Attending Physician Psychiatry  04/20/16   Renie Ora, MD Referring Physician Anesthesiology  04/20/16   Brunetta Genera, MD Consulting Physician Hematology  11/30/16     Health Maintenance  Topic Date Due   Hepatitis C Screening  Never done   TETANUS/TDAP  08/28/2018   FOOT EXAM  09/22/2020   OPHTHALMOLOGY EXAM  10/29/2020   COVID-19 Vaccine (5 - Booster for Moderna series) 11/02/2020   HEMOGLOBIN A1C  06/09/2021   Pneumonia Vaccine 75+ Years old  Completed   INFLUENZA VACCINE  Completed   DEXA SCAN  Completed   Zoster Vaccines- Shingrix  Completed   HPV VACCINES  Aged Out      Review of Systems Patient reports no vision/ hearing changes, adenopathy,fever, weight change,  persistant/recurrent hoarseness , swallowing issues, chest pain, palpitations, edema, persistant/recurrent cough, hemoptysis, gastrointestinal bleeding (melena, rectal bleeding), abdominal pain, significant heartburn, bowel changes, GU symptoms (dysuria, hematuria, incontinence), Gyn symptoms (abnormal  bleeding, pain),  syncope, focal weakness, memory loss, numbness & tingling, skin/hair/nail changes, abnormal bruising or bleeding.   + depression- pt scores 12 on PHQ but states 'I'm not a bit depressed'.  'I'm just tired'. + fatigue + SOB  This visit occurred  during the SARS-CoV-2 public health emergency.  Safety protocols were in place, including screening questions prior to the visit, additional usage of staff PPE, and extensive cleaning of exam room while observing appropriate contact time as indicated for disinfecting solutions.      Objective:   Physical Exam General Appearance:    Alert, cooperative, no distress, appears stated age  Head:    Normocephalic, without obvious abnormality, atraumatic  Eyes:    PERRL, conjunctiva/corneas clear, EOM's intact, fundi    benign, both eyes  Ears:    Normal TM's and external ear canals, both ears  Nose:   Deferred due to COVID  Throat:   Neck:   Supple, symmetrical, trachea midline, no adenopathy;    Thyroid: no enlargement/tenderness/nodules  Back:     Symmetric, no curvature, ROM normal, no CVA tenderness  Lungs:     Clear to auscultation bilaterally, respirations unlabored  Chest Wall:    No tenderness or deformity   Heart:    Regular rate and rhythm, S1 and S2 normal, no murmur, rub   or gallop  Breast Exam:    Deferred to mammo  Abdomen:     Soft, non-tender, bowel sounds active all four quadrants,    no masses, no organomegaly  Genitalia:    Deferred  Rectal:    Extremities:   Extremities normal, atraumatic, no cyanosis or edema  Pulses:   2+ and symmetric all extremities  Skin:   Skin color, texture, turgor normal, no rashes or lesions  Lymph nodes:   Cervical, supraclavicular,  and axillary nodes normal  Neurologic:   CNII-XII intact, normal strength, sensation and reflexes    throughout          Assessment & Plan:

## 2021-02-07 DIAGNOSIS — E039 Hypothyroidism, unspecified: Secondary | ICD-10-CM | POA: Diagnosis not present

## 2021-02-07 DIAGNOSIS — N183 Chronic kidney disease, stage 3 unspecified: Secondary | ICD-10-CM | POA: Diagnosis not present

## 2021-02-07 DIAGNOSIS — H04123 Dry eye syndrome of bilateral lacrimal glands: Secondary | ICD-10-CM | POA: Diagnosis not present

## 2021-02-07 DIAGNOSIS — Z0000 Encounter for general adult medical examination without abnormal findings: Secondary | ICD-10-CM | POA: Diagnosis not present

## 2021-02-08 ENCOUNTER — Telehealth: Payer: Self-pay

## 2021-02-08 NOTE — Telephone Encounter (Signed)
Caller name:Leianne Gandolfo   On DPR? :Yes  Call back [phone]  Provider they see: Birdie Riddle  Reason for call: Pt went to [facility] yesterday having problems with her eyes and the Dr wanted her referred to a ophthalmologist stat and he wanted to follow up with her in a week but pt wanted to make appt with Dr Birdie Riddle

## 2021-02-08 NOTE — Telephone Encounter (Signed)
Attempted to call, no answer and no vm picked up

## 2021-02-09 DIAGNOSIS — H16223 Keratoconjunctivitis sicca, not specified as Sjogren's, bilateral: Secondary | ICD-10-CM | POA: Diagnosis not present

## 2021-02-09 NOTE — Telephone Encounter (Signed)
Called patient and spoke with her about what's going on. I felt that she needed to be seen and she has had a hard time getting in contact with her regular eye dr at [facility]. I called them and explained what was going on and they agreed she needed to be seen and would work her in at 33. I called patient back to let her know.

## 2021-02-09 NOTE — Telephone Encounter (Signed)
Lvm for patient to return my call

## 2021-02-09 NOTE — Telephone Encounter (Signed)
Pt returned [employer]

## 2021-02-14 ENCOUNTER — Ambulatory Visit: Payer: Medicare PPO | Admitting: Family Medicine

## 2021-02-14 ENCOUNTER — Other Ambulatory Visit: Payer: Self-pay | Admitting: Psychiatry

## 2021-02-16 ENCOUNTER — Ambulatory Visit: Payer: Medicare PPO

## 2021-02-16 ENCOUNTER — Telehealth: Payer: Self-pay

## 2021-02-16 NOTE — Telephone Encounter (Signed)
Pt called to RS her coumadin clinic apt for today due to not feeling well. Pt reports she has not energy and it is from having low hemoglobin. She requested it to be RS to next week. Scheduled pt for 12/6 at [facility]. Advised if any changes or worsening of fatigue to contact the clinic. Pt verbalized understanding.

## 2021-02-18 ENCOUNTER — Telehealth: Payer: Self-pay | Admitting: Family Medicine

## 2021-02-18 NOTE — Telephone Encounter (Signed)
Attempted to schedule AWV. Unable to LVM.  Will try at later time.  

## 2021-02-21 DIAGNOSIS — I10 Essential (primary) hypertension: Secondary | ICD-10-CM | POA: Diagnosis not present

## 2021-02-22 ENCOUNTER — Other Ambulatory Visit: Payer: Self-pay

## 2021-02-22 ENCOUNTER — Ambulatory Visit: Payer: Medicare PPO

## 2021-02-22 DIAGNOSIS — Z7901 Long term (current) use of anticoagulants: Secondary | ICD-10-CM | POA: Diagnosis not present

## 2021-02-22 LAB — POCT INR: INR: 1.2 — AB (ref 2.0–3.0)

## 2021-02-22 NOTE — Patient Instructions (Addendum)
Pre visit review using our clinic review tool, if applicable. No additional management support is needed unless otherwise documented below in the visit note.  Take 1 tablet today and take 1 tablet tomorrow and then change weekly dose to take 1/2 tablet daily except take 1 tablet on Wednesdays and Saturdays. Re-check in 1 weeks.

## 2021-02-22 NOTE — Progress Notes (Signed)
Take 1 tablet today and take 1 tablet tomorrow and then change weekly dose to take 1/2 tablet daily except take 1 tablet on Wednesdays and Saturdays. Re-check in 1 weeks.

## 2021-02-23 ENCOUNTER — Encounter: Payer: Self-pay | Admitting: Family Medicine

## 2021-02-23 ENCOUNTER — Telehealth: Payer: Self-pay

## 2021-02-23 NOTE — Telephone Encounter (Signed)
Wanted to thank [employer] for helping them last week

## 2021-02-28 ENCOUNTER — Other Ambulatory Visit: Payer: Self-pay | Admitting: Cardiology

## 2021-02-28 ENCOUNTER — Encounter: Payer: Self-pay | Admitting: Cardiology

## 2021-02-28 DIAGNOSIS — Z791 Long term (current) use of non-steroidal anti-inflammatories (NSAID): Secondary | ICD-10-CM | POA: Diagnosis not present

## 2021-02-28 DIAGNOSIS — M1990 Unspecified osteoarthritis, unspecified site: Secondary | ICD-10-CM | POA: Diagnosis not present

## 2021-02-28 DIAGNOSIS — E7800 Pure hypercholesterolemia, unspecified: Secondary | ICD-10-CM | POA: Diagnosis not present

## 2021-02-28 DIAGNOSIS — I10 Essential (primary) hypertension: Secondary | ICD-10-CM

## 2021-02-28 DIAGNOSIS — E1165 Type 2 diabetes mellitus with hyperglycemia: Secondary | ICD-10-CM | POA: Diagnosis not present

## 2021-02-28 DIAGNOSIS — M810 Age-related osteoporosis without current pathological fracture: Secondary | ICD-10-CM | POA: Diagnosis not present

## 2021-02-28 DIAGNOSIS — E039 Hypothyroidism, unspecified: Secondary | ICD-10-CM | POA: Diagnosis not present

## 2021-02-28 DIAGNOSIS — H04123 Dry eye syndrome of bilateral lacrimal glands: Secondary | ICD-10-CM | POA: Diagnosis not present

## 2021-02-28 DIAGNOSIS — R682 Dry mouth, unspecified: Secondary | ICD-10-CM | POA: Diagnosis not present

## 2021-02-28 DIAGNOSIS — F329 Major depressive disorder, single episode, unspecified: Secondary | ICD-10-CM | POA: Diagnosis not present

## 2021-02-28 DIAGNOSIS — M797 Fibromyalgia: Secondary | ICD-10-CM | POA: Diagnosis not present

## 2021-02-28 DIAGNOSIS — M150 Primary generalized (osteo)arthritis: Secondary | ICD-10-CM | POA: Diagnosis not present

## 2021-02-28 NOTE — Telephone Encounter (Signed)
From pt

## 2021-03-02 ENCOUNTER — Ambulatory Visit: Payer: Medicare PPO

## 2021-03-02 ENCOUNTER — Telehealth: Payer: Self-pay

## 2021-03-02 NOTE — Telephone Encounter (Signed)
Pt's husband LVM reporting pt is not feeling well and needs to cancel her apt at 2:30 today.    Contacted pt and she reports cough, congestion and HA but reports it is a cold or maybe just a sinus infection. Advised pt to contact her PCP office to see if she could make an apt. She said her and her husband tested for covid today and they are both negative. Advised she could have the flu or RSV and she should most likely be seen or at least have a virtual visit with a provider. Pt said if she still feels this way tomorrow she will contact PCP office and make an apt. Advised if any SOB or worsening of symptoms or she has any new symptoms to contact the office. Advised of ER precautions.  Reminded pt her INR was only 1.2 at her last visit and the risk with her INR being that low. Advised pt it is very important to have INR checked this week due to this nurse not being in the office next week. Pt said she could not make another apt this week because she is afraid she will just have to cancel it again. She said she also could not got to [facility] location because she is not sure if her husband can drive her there. Advised pt the next soonest apt for coumadin clinic at [facility] is 12/28. Pt agreed to be scheduled for 12/28. Advised pt if any changes in symptoms or she thought she needed INR checked before that date, she should contact her PCP office and have them draw a lab and have it sent to the PCP. Pt verbalized understanding.   Pt scheduled for coumadin clinic on 12/28.

## 2021-03-05 ENCOUNTER — Other Ambulatory Visit: Payer: Self-pay

## 2021-03-05 ENCOUNTER — Emergency Department (HOSPITAL_COMMUNITY): Payer: Medicare PPO

## 2021-03-05 ENCOUNTER — Encounter (HOSPITAL_COMMUNITY): Payer: Self-pay

## 2021-03-05 ENCOUNTER — Emergency Department (HOSPITAL_COMMUNITY)
Admission: EM | Admit: 2021-03-05 | Discharge: 2021-03-05 | Disposition: A | Discharge status: Home / Self Care | Payer: Medicare PPO | Attending: Emergency Medicine | Admitting: Emergency Medicine

## 2021-03-05 DIAGNOSIS — E119 Type 2 diabetes mellitus without complications: Secondary | ICD-10-CM | POA: Insufficient documentation

## 2021-03-05 DIAGNOSIS — Z853 Personal history of malignant neoplasm of breast: Secondary | ICD-10-CM | POA: Insufficient documentation

## 2021-03-05 DIAGNOSIS — I10 Essential (primary) hypertension: Secondary | ICD-10-CM | POA: Insufficient documentation

## 2021-03-05 DIAGNOSIS — S2232XA Fracture of one rib, left side, initial encounter for closed fracture: Secondary | ICD-10-CM | POA: Diagnosis not present

## 2021-03-05 DIAGNOSIS — E039 Hypothyroidism, unspecified: Secondary | ICD-10-CM | POA: Insufficient documentation

## 2021-03-05 DIAGNOSIS — Z79899 Other long term (current) drug therapy: Secondary | ICD-10-CM | POA: Insufficient documentation

## 2021-03-05 DIAGNOSIS — Z7984 Long term (current) use of oral hypoglycemic drugs: Secondary | ICD-10-CM | POA: Insufficient documentation

## 2021-03-05 DIAGNOSIS — W19XXXA Unspecified fall, initial encounter: Secondary | ICD-10-CM | POA: Insufficient documentation

## 2021-03-05 DIAGNOSIS — M25562 Pain in left knee: Secondary | ICD-10-CM | POA: Diagnosis not present

## 2021-03-05 DIAGNOSIS — S2242XA Multiple fractures of ribs, left side, initial encounter for closed fracture: Secondary | ICD-10-CM | POA: Diagnosis not present

## 2021-03-05 DIAGNOSIS — S299XXA Unspecified injury of thorax, initial encounter: Secondary | ICD-10-CM | POA: Diagnosis present

## 2021-03-05 NOTE — ED Provider Notes (Signed)
°  Face-to-face evaluation   History: Patient injured her left anterior ribs, 3 days ago and a fall.  She tripped on a magazine rack walking in the dark.  Since then she has pain in her left knee and left ribs.  She denies headache, neck pain, weakness or dizziness.  Physical exam: Alert, elderly female.  No dysarthria or aphasia.  No respiratory distress.  Chest tender anteriorly diffusely on the left lower, but no associated crepitation.  Abdomen soft and nontender.  Medical screening examination/treatment/procedure(s) were conducted as a shared visit with non-physician practitioner(s) and myself.  I personally evaluated the patient during the encounter    Daleen Bo, MD 03/06/21 [phone]

## 2021-03-05 NOTE — ED Triage Notes (Signed)
Pt arrived via POV c/o injury to left side after falling at home Thursday night. Pt ambulatory in Triage without any assistive device. Pt  reports left ribs hurt the most.

## 2021-03-05 NOTE — Discharge Instructions (Signed)
You have fractured your eighth rib, I want to continue with over-the-counter pain medications.  I have given you a incentive spirometer please use 3 times daily for next 3 weeks as this will help prevent pneumonia.  I want you to follow-up with your PCP in 3 weeks time for repeat chest x-ray.  Come back to the emergency department if you develop chest pain, shortness of breath, severe abdominal pain, uncontrolled nausea, vomiting, diarrhea.

## 2021-03-05 NOTE — ED Provider Notes (Signed)
[facility] EMERGENCY DEPARTMENT Provider Note   CSN: [id number] Arrival date & time: 03/05/21  2100     History Chief Complaint  Patient presents with   Andrea Simmons is a 80 y.o. female.  HPI  Patient with medical history including anxiety, breast cancer, diabetes, hypertension, currently on anticoagulants presents with left rib pain.  Patient states on Thursday night she went to use the restroom but did not turn on the lights and did not the basket on the floor which she tripped over, fall onto her left side.  She denies hitting her head, losing conscious, states that she had difficulty getting up and her husband had to help her up.  She states she is able to ambulate after the incident.  She states that she was starting to feel better but her left rib pain was getting worse, she states that she has some difficulty breathing as this causes pain denies pleuritic chest pain or shortness of breath.  She has no headaches, change in vision, paresthesias or weakness in the upper/ lower extremities, denies neck, back pain, chest pain, pain in her abdomen or lower extremities.  She has no other complaints at this time.  Past Medical History:  Diagnosis Date   Anemia    Anxiety    Breast cancer ([place])    Depression    Diabetes mellitus type II    Fibromyalgia    GERD (gastroesophageal reflux disease)    Hyperlipidemia    Hypertension    Malignant neoplasm of breast (female), unspecified site 1993   L breast s/p mastectomy and tamoxifen x 18yr   Other pulmonary embolism and infarction 2008 and 2009   chronic anticoag - LeB CC   Unspecified hypothyroidism     Patient Active Problem List   Diagnosis Date Noted   Sjogren's disease ([place] 01/24/2021   Erroneous encounter - disregard 01/21/2021   Abnormal gait 12/10/2020   Hereditary and idiopathic neuropathy, unspecified 12/10/2020   Major depressive disorder, single episode, unspecified 12/10/2020   Osteoarthritis  12/10/2020   Esophageal dysphagia 09/16/2020   Dehydration 09/16/2020   AKI (acute kidney injury) ([place] 09/16/2020   Elevated lipase 09/16/2020   Hyperglycemia due to diabetes mellitus ([place] 09/16/2020   Vitamin D deficiency 10/01/2019   Polypharmacy 10/01/2019   Major depressive disorder, recurrent episode, moderate ([place] 10/01/2019   Leg edema 06/04/2019   Chest pain of uncertain etiology 191/91/6606  Hx of pulmonary embolus 03/30/2017   Physical exam 11/30/2016   Hyperlipidemia 12/26/2013   Encounter for therapeutic drug monitoring 04/16/2013   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   Long term (current) use of anticoagulants 06/30/2010   Exertional dyspnea 11/09/2008   Diabetes type 2, controlled ([place] 08/27/2008   ANEMIA-NOS 08/27/2008   Seasonal and perennial allergic rhinitis 08/27/2008   PULMONARY NODULE 08/27/2008   Fibromyalgia 08/27/2008   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 ([place] 03/20/2007   Hypothyroidism 03/20/2007   Essential hypertension 03/20/2007   Recurrent pulmonary emboli ([place] 03/20/2007   GERD 03/20/2007   ESOPHAGEAL STRICTURE 03/08/2007    Past Surgical History:  Procedure Laterality Date   APPENDECTOMY     BALLOON DILATION N/A 10/18/2020   Procedure: BALLOON DILATION;  Surgeon: CEloise Harman DO;  Location: [facility] ENDO SUITE;  Service: Endoscopy;  Laterality: N/A;   BIOPSY  10/18/2020   Procedure: BIOPSY;  Surgeon: CEloise Harman DO;  Location: [facility] ENDO SUITE;  Service: Endoscopy;;  gastric  BREAST BIOPSY Left 1993   BREAST BIOPSY Right 09/25/2014   stero. Benign   BREAST RECONSTRUCTION Left    CATARACT EXTRACTION     ESOPHAGOGASTRODUODENOSCOPY (EGD) WITH PROPOFOL N/A 10/18/2020   Procedure: ESOPHAGOGASTRODUODENOSCOPY (EGD) WITH PROPOFOL;  Surgeon: Eloise Harman, DO;  Location: [facility] ENDO SUITE;  Service: Endoscopy;  Laterality: N/A;  11:00am   MASTECTOMY Left    ROTATOR CUFF REPAIR     SPINAL FUSION      x 2    TOTAL ABDOMINAL HYSTERECTOMY W/ BILATERAL SALPINGOOPHORECTOMY     TUBAL LIGATION       OB History   No obstetric history on file.     Family History  Problem Relation Age of Onset   Depression Other        Parent   Arthritis Other        Parent, Grandparent   Hypertension Other        Grandparent   Hyperlipidemia Other        [place]   Miscarriages / Stillbirths Other        Grandparent   Stroke Other        [place]   Cancer Maternal Uncle        prostate   Hypertension Maternal Grandfather    Breast cancer Cousin    Breast cancer Cousin     Social History   Tobacco Use   Smoking status: Never   Smokeless tobacco: Never  Vaping Use   Vaping Use: Never used  Substance Use Topics   Alcohol use: No    Alcohol/week: 0.0 standard drinks   Drug use: No    Home Medications Prior to Admission medications   Medication Sig Start Date End Date Taking? Authorizing Provider  ACCU-CHEK GUIDE test strip CHECK BLOOD SUGARS DAILY 10/20/20   [name], MD  Accu-Chek Softclix Lancets lancets USE AS INSTRUCTED TO TEST SUGARS TWICE A DAY 08/18/20   [name], MD  amLODipine (NORVASC) 10 MG tablet TAKE 1 TABLET BY MOUTH EVERY DAY 02/28/21   [name], Reynold Bowen, MD  Biotin 1000 MCG tablet Take 1,000 mcg by mouth daily.    [provider]  Blood Glucose Monitoring Suppl (ACCU-CHEK GUIDE) w/Device KIT 1 each by Does not apply route 2 (two) times daily. To test sugars. Dx. E11.9 06/23/19   [name], MD  Colchicine (MITIGARE) 0.6 MG CAPS Take 1 capsule by mouth 2 (two) times daily as needed. 09/16/20   [name], Legrand Como, MD  DULoxetine (CYMBALTA) 30 MG capsule Take 3 capsules (90 mg total) by mouth daily. 12/15/20   [name], Billey Co., MD  enoxaparin (LOVENOX) 60 MG/0.6ML injection Inject 0.6 mLs (60 mg total) into the skin daily. 10/11/20   Brunetta Genera, MD  eszopiclone (LUNESTA) 2 MG TABS tablet TAKE 1 TABLET (2 MG TOTAL) BY MOUTH AT BEDTIME AS NEEDED FOR  SLEEP. TAKE IMMEDIATELY BEFORE BEDTIME 01/11/21   [name], Billey Co., MD  furosemide (LASIX) 20 MG tablet TAKE 1 TABLET BY MOUTH AS NEEDED Patient taking differently: Take 20 mg by mouth daily as needed for edema. 08/27/19   [name], Reynold Bowen, MD  hydrALAZINE (APRESOLINE) 50 MG tablet Take 1 tablet (50 mg total) by mouth 3 (three) times daily. 12/16/20 03/16/21  [name], Reynold Bowen, MD  labetalol (NORMODYNE) 200 MG tablet Take 1 tablet (200 mg total) by mouth 2 (two) times daily. 01/20/21   [name], Reynold Bowen, MD  levothyroxine (SYNTHROID) 25 MCG tablet TAKE 1  TABLET BY MOUTH EVERY DAY BEFORE BREAKFAST 10/20/20   [name], MD  Magnesium 500 MG TABS Take 500 mg by mouth daily.    [provider]  metFORMIN (GLUCOPHAGE-XR) 500 MG 24 hr tablet Take 500 mg by mouth daily with breakfast. 03/23/16   [provider]  mirtazapine (REMERON) 7.5 MG tablet Take 1 tablet (7.5 mg total) by mouth at bedtime. 08/12/20   [name], Billey Co., MD  nitroGLYCERIN (NITROSTAT) 0.4 MG SL tablet Place 1 tablet (0.4 mg total) under the tongue every 5 (five) minutes as needed for chest pain. 10/31/18 03/03/21  [name], Reynold Bowen, MD  pantoprazole (PROTONIX) 40 MG tablet Take 1 tablet (40 mg total) by mouth 2 (two) times daily before a meal. 09/18/20 12/16/20  Manuella Ghazi, [name], DO  RESTASIS MULTIDOSE 0.05 % ophthalmic emulsion Place 1 drop into both eyes 2 (two) times daily.  06/21/16   [provider]  rosuvastatin (CRESTOR) 20 MG tablet Take 20 mg by mouth daily.    [provider]  valsartan (DIOVAN) 320 MG tablet Take 0.5 tablets (160 mg total) by mouth daily. 12/16/20   [name], Reynold Bowen, MD  Vitamin D, Ergocalciferol, (DRISDOL) 1.25 MG (50000 UNIT) CAPS capsule TAKE 1 CAPSULE BY MOUTH ONE TIME PER WEEK 02/14/21   [name], Billey Co., MD  warfarin (COUMADIN) 5 MG tablet TAKE 5 MG ON SUNDAY, THEN TAKE HALF A TABLET AFTERWARDS 11/17/20   Dutch Quint B, FNP    Allergies    Lipitor  [atorvastatin], Morphine, Meperidine, and Naproxen sodium  Review of Systems   Review of Systems  Constitutional:  Negative for chills and fever.  HENT:  Negative for congestion.   Respiratory:  Negative for shortness of breath.   Cardiovascular:  Negative for chest pain.  Gastrointestinal:  Negative for abdominal pain.  Genitourinary:  Negative for enuresis.  Musculoskeletal:  Negative for back pain.       Left-sided rib pain and knee pain.  Skin:  Negative for rash.  Neurological:  Negative for dizziness.  Hematological:  Does not bruise/bleed easily.   Physical Exam Updated Vital Signs BP (!) 109/54 (BP Location: Right Arm)    Pulse 61    Temp 97.7 F (36.5 C) (Oral)    Resp (!) 21    Ht '5\' 4"'  (1.626 m)    Wt 71 kg    SpO2 95%    BMI 26.87 kg/m   Physical Exam Vitals and nursing note reviewed.  Constitutional:      General: She is not in acute distress.    Appearance: She is not ill-appearing.  HENT:     Head: Normocephalic and atraumatic.     Comments: No deformity of the head present, no raccoon eyes battle sign noted, head is nontender to palpation.    Nose: No congestion.     Mouth/Throat:     Mouth: Mucous membranes are moist.     Pharynx: Oropharynx is clear.     Comments: No trismus or torticollis, no trauma present Eyes:     Extraocular Movements: Extraocular movements intact.     Conjunctiva/sclera: Conjunctivae normal.  Cardiovascular:     Rate and Rhythm: Normal rate and regular rhythm.     Pulses: Normal pulses.     Heart sounds: No murmur heard.   No friction rub. No gallop.  Pulmonary:     Effort: No respiratory distress.     Breath sounds: No wheezing, rhonchi or rales.  Comments: Chest was palpated she had noted tenderness on the anterior aspect of the left sixth rib midclavicular.  No crepitus or deformities noted. Abdominal:     Palpations: Abdomen is soft.     Tenderness: There is no abdominal tenderness. There is no right CVA tenderness or  left CVA tenderness.  Musculoskeletal:     Cervical back: No tenderness.     Right lower leg: No edema.     Left lower leg: No edema.     Comments: Spine was palpated nontender to palpation, no step-off deformities present, no pelvis instability no leg shortening no internal or external rotation present.  Patient was slightly tender to palpation on her left knee no crepitus or deformities noted.  Neurovascular intact in lower extremities was the upper extremities.  Skin:    General: Skin is warm and dry.     Comments: Exam was performed no ecchymosis or other signs of trauma seen on her upper or lower extremities abdomen or back.  Neurological:     Mental Status: She is alert.     Comments: No facial asymmetry, no difficulty with word finding, able to follow two-step commands, no unilateral weakness present.  Ambulate without difficulty.  Psychiatric:        Mood and Affect: Mood normal.    ED Results / Procedures / Treatments   Labs (all labs ordered are listed, but only abnormal results are displayed) Labs Reviewed - No data to display  EKG None  Radiology DG Ribs Unilateral W/Chest Left  Result Date: 03/05/2021 CLINICAL DATA:  Status post fall. EXAM: LEFT RIBS AND CHEST - 3+ VIEW COMPARISON:  September 21, 2020 FINDINGS: A radiopaque marker was placed at the site of the patient's pain. A nondisplaced eighth left rib fracture is noted. There is no evidence of pneumothorax or pleural effusion. There is no evidence of acute infiltrate, pleural effusion or pneumothorax. Radiopaque surgical clips are seen overlying the left lung base, right upper quadrant and lateral aspect of the upper left hemithorax. Heart size and mediastinal contours are within normal limits. IMPRESSION: Nondisplaced eighth left rib fracture. Electronically Signed   By: Virgina Norfolk M.D.   On: 03/05/2021 22:32   DG Knee Complete 4 Views Left  Result Date: 03/05/2021 CLINICAL DATA:  Recent fall 2 days ago with left  knee pain, initial encounter EXAM: LEFT KNEE - COMPLETE 4+ VIEW COMPARISON:  None. FINDINGS: Mild degenerative changes are noted in all 3 joint compartments. No joint effusion is seen. No acute fracture or dislocation is noted. IMPRESSION: Mild degenerative change without acute abnormality. Electronically Signed   By: Inez Catalina M.D.   On: 03/05/2021 22:33    Procedures Procedures   Medications Ordered in ED Medications - No data to display  ED Course  I have reviewed the triage vital signs and the nursing notes.  Pertinent labs & imaging results that were available during my care of the patient were reviewed by me and considered in my medical decision making (see chart for details).    MDM Rules/Calculators/A&P                         Initial impression-presents for a fall, alert, no acute stress, vital signs reassuring.  Will obtain imaging of the left rib as well as left knee.  Work-up-imaging reveals nondisplaced left eighth rib fracture.  DG of left knee negative for acute findings.  Rule out- low suspicion for intracranial head bleed as  patient denies loss of conscious, did not hit her head, she does not endorse headaches, paresthesia/weakness in the upper and lower extremities, no focal deficits present on my exam, fall happens on Thursday she has no complaints this time very low likelihood of internal head bleed will defer imaging at this time.  Low suspicion for spinal cord abnormality or spinal fracture spine was palpated was nontender to palpation, patient has full range of motion in the upper and lower extremities.  Low suspicion for pneumothorax as lung sounds are clear bilaterally, x-ray is negative for acute findings.  Low suspicion for intra-abdominal trauma as abdomen soft nontender to palpation.  Low suspicion for orthopedic injury as imaging is negative for acute findings.   Plan-  Left eighth rib fracture- will recommend over-the-counter pain medications, provided with  an incentive spirometer, follow-up PCP 3 weeks time for reevaluation.  Given strict return precautions.  Vital signs have remained stable, no indication for hospital admission.  Patient discussed with attending and they agreed with assessment and plan.  Patient given at home care as well strict return precautions.  Patient verbalized that they understood agreed to said plan.      Final Clinical Impression(s) / ED Diagnoses Final diagnoses:  Fall, initial encounter  Closed fracture of one rib of left side, initial encounter    Rx / DC Orders ED Discharge Orders     None        Aron Baba 03/05/21 2307    Daleen Bo, MD 03/06/21 [phone]

## 2021-03-16 ENCOUNTER — Telehealth (INDEPENDENT_AMBULATORY_CARE_PROVIDER_SITE_OTHER): Payer: Medicare PPO | Admitting: Registered Nurse

## 2021-03-16 ENCOUNTER — Encounter: Payer: Self-pay | Admitting: Registered Nurse

## 2021-03-16 ENCOUNTER — Ambulatory Visit: Payer: Medicare PPO | Admitting: Psychiatry

## 2021-03-16 ENCOUNTER — Encounter: Payer: Self-pay | Admitting: Psychiatry

## 2021-03-16 ENCOUNTER — Ambulatory Visit (INDEPENDENT_AMBULATORY_CARE_PROVIDER_SITE_OTHER): Payer: Medicare PPO

## 2021-03-16 ENCOUNTER — Other Ambulatory Visit: Payer: Self-pay

## 2021-03-16 VITALS — BP 162/91 | HR 68 | Temp 100.0°F | Wt 146.0 lb

## 2021-03-16 DIAGNOSIS — G4721 Circadian rhythm sleep disorder, delayed sleep phase type: Secondary | ICD-10-CM | POA: Diagnosis not present

## 2021-03-16 DIAGNOSIS — F5105 Insomnia due to other mental disorder: Secondary | ICD-10-CM | POA: Diagnosis not present

## 2021-03-16 DIAGNOSIS — F338 Other recurrent depressive disorders: Secondary | ICD-10-CM

## 2021-03-16 DIAGNOSIS — F331 Major depressive disorder, recurrent, moderate: Secondary | ICD-10-CM

## 2021-03-16 DIAGNOSIS — F4001 Agoraphobia with panic disorder: Secondary | ICD-10-CM

## 2021-03-16 DIAGNOSIS — R7989 Other specified abnormal findings of blood chemistry: Secondary | ICD-10-CM

## 2021-03-16 DIAGNOSIS — F411 Generalized anxiety disorder: Secondary | ICD-10-CM

## 2021-03-16 DIAGNOSIS — J069 Acute upper respiratory infection, unspecified: Secondary | ICD-10-CM

## 2021-03-16 DIAGNOSIS — Z7901 Long term (current) use of anticoagulants: Secondary | ICD-10-CM | POA: Diagnosis not present

## 2021-03-16 LAB — POCT INR: INR: 3 (ref 2.0–3.0)

## 2021-03-16 MED ORDER — AMOXICILLIN-POT CLAVULANATE 500-125 MG PO TABS
1.0000 | ORAL_TABLET | Freq: Every day | ORAL | 0 refills | Status: DC
Start: 2021-03-16 — End: 2021-03-28

## 2021-03-16 MED ORDER — AZELASTINE HCL 0.1 % NA SOLN: 1.0000 | Freq: Two times a day (BID) | NASAL | 12 refills | Status: DC

## 2021-03-16 MED ORDER — DM-GUAIFENESIN ER 30-600 MG PO TB12: 1.0000 | ORAL_TABLET | Freq: Every day | ORAL | 0 refills | Status: DC | PRN

## 2021-03-16 NOTE — Progress Notes (Signed)
Telemedicine Encounter- SOAP NOTE Established Patient  This telephone encounter was conducted with the patient's (or proxy's) verbal consent via audio telecommunications: yes/no: Yes Patient was instructed to have this encounter in a suitably private space; and to only have persons present to whom they give permission to participate. In addition, patient identity was confirmed by use of name plus two identifiers (DOB and address).  I discussed the limitations, risks, security and privacy concerns of performing an evaluation and management service by telephone and the availability of in person appointments. I also discussed with the patient that there may be a patient responsible charge related to this service. The patient expressed understanding and agreed to proceed.  I spent a total of 15 minutes talking with the patient or their proxy.  Patient at home Provider in office  Participants: Kathrin Ruddy, NP and Steward Drone  Chief Complaint  Patient presents with   Generalized Body Aches    Patient states she has been experiencing some body aches, sinus congestion, low grade fever of 100.9. Patient thinks she has the flu. She is not taking any medication other than what she is prescribed at the moment.     Subjective   Andrea Simmons is a 80 y.o. established patient. Telephone visit today for flu like symptoms  HPI Onset one week ago First symptoms were aching and malaise Then developed into sinus congestion.  Has not taken home covid or flu tests  No sick contacts to her knowledge.   Denies shob, doe, chest pain, nvd, headaches, sensory changes.  CKD with last GFR 22 on 02/01/21  Patient Active Problem List   Diagnosis Date Noted   Sjogren's disease ([place]) 01/24/2021   Erroneous encounter - disregard 01/21/2021   Abnormal gait 12/10/2020   Hereditary and idiopathic neuropathy, unspecified 12/10/2020   Major depressive disorder, single episode, unspecified 12/10/2020    Osteoarthritis 12/10/2020   Esophageal dysphagia 09/16/2020   Dehydration 09/16/2020   AKI (acute kidney injury) ([place]) 09/16/2020   Elevated lipase 09/16/2020   Hyperglycemia due to diabetes mellitus ([place]) 09/16/2020   Vitamin D deficiency 10/01/2019   Polypharmacy 10/01/2019   Major depressive disorder, recurrent episode, moderate ([place]) 10/01/2019   Leg edema 06/04/2019   Chest pain of uncertain etiology 79/15/0569   Hx of pulmonary embolus 03/30/2017   Physical exam 11/30/2016   Hyperlipidemia 12/26/2013   Encounter for therapeutic drug monitoring 04/16/2013   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   Long term (current) use of anticoagulants 06/30/2010   Exertional dyspnea 11/09/2008   Diabetes type 2, controlled ([place]) 08/27/2008   ANEMIA-NOS 08/27/2008   Seasonal and perennial allergic rhinitis 08/27/2008   PULMONARY NODULE 08/27/2008   Fibromyalgia 08/27/2008   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 ([place]) 03/20/2007   Hypothyroidism 03/20/2007   Essential hypertension 03/20/2007   Recurrent pulmonary emboli ([place]) 03/20/2007   GERD 03/20/2007   ESOPHAGEAL STRICTURE 03/08/2007    Past Medical History:  Diagnosis Date   Anemia    Anxiety    Breast cancer ([place])    Depression    Diabetes mellitus type II    Fibromyalgia    GERD (gastroesophageal reflux disease)    Hyperlipidemia    Hypertension    Malignant neoplasm of breast (female), unspecified site 1993   L breast s/p mastectomy and tamoxifen x 80yr   Other pulmonary embolism and infarction 2008 and 2009   chronic anticoag - LeB CC   Unspecified hypothyroidism  Current Outpatient Medications  Medication Sig Dispense Refill   ACCU-CHEK GUIDE test strip CHECK BLOOD SUGARS DAILY 100 strip 12   Accu-Chek Softclix Lancets lancets USE AS INSTRUCTED TO TEST SUGARS TWICE A DAY 100 each 12   amLODipine (NORVASC) 10 MG tablet TAKE 1 TABLET BY MOUTH EVERY DAY 90 tablet 2    amoxicillin-clavulanate (AUGMENTIN) 500-125 MG tablet Take 1 tablet (500 mg total) by mouth daily. 10 tablet 0   azelastine (ASTELIN) 0.1 % nasal spray Place 1 spray into both nostrils 2 (two) times daily. Use in each nostril as directed 30 mL 12   Biotin 1000 MCG tablet Take 1,000 mcg by mouth daily.     Blood Glucose Monitoring Suppl (ACCU-CHEK GUIDE) w/Device KIT 1 each by Does not apply route 2 (two) times daily. To test sugars. Dx. E11.9 1 kit 1   Colchicine (MITIGARE) 0.6 MG CAPS Take 1 capsule by mouth 2 (two) times daily as needed. 60 capsule 3   dextromethorphan-guaiFENesin (MUCINEX DM) 30-600 MG 12hr tablet Take 1 tablet by mouth daily as needed for cough. 10 tablet 0   DULoxetine (CYMBALTA) 30 MG capsule Take 3 capsules (90 mg total) by mouth daily. 270 capsule 1   enoxaparin (LOVENOX) 60 MG/0.6ML injection Inject 0.6 mLs (60 mg total) into the skin daily. 12 mL 0   eszopiclone (LUNESTA) 2 MG TABS tablet TAKE 1 TABLET (2 MG TOTAL) BY MOUTH AT BEDTIME AS NEEDED FOR SLEEP. TAKE IMMEDIATELY BEFORE BEDTIME 30 tablet 2   furosemide (LASIX) 20 MG tablet TAKE 1 TABLET BY MOUTH AS NEEDED (Patient taking differently: Take 20 mg by mouth daily as needed for edema.) 90 tablet 1   hydrALAZINE (APRESOLINE) 50 MG tablet Take 1 tablet (50 mg total) by mouth 3 (three) times daily. 90 tablet 3   labetalol (NORMODYNE) 200 MG tablet Take 1 tablet (200 mg total) by mouth 2 (two) times daily. 180 tablet 2   levothyroxine (SYNTHROID) 25 MCG tablet TAKE 1 TABLET BY MOUTH EVERY DAY BEFORE BREAKFAST 90 tablet 1   Magnesium 500 MG TABS Take 500 mg by mouth daily.     metFORMIN (GLUCOPHAGE-XR) 500 MG 24 hr tablet Take 500 mg by mouth daily with breakfast.  3   mirtazapine (REMERON) 7.5 MG tablet Take 1 tablet (7.5 mg total) by mouth at bedtime. 90 tablet 1   RESTASIS MULTIDOSE 0.05 % ophthalmic emulsion Place 1 drop into both eyes 2 (two) times daily.   6   rosuvastatin (CRESTOR) 20 MG tablet Take 20 mg by mouth  daily.     valsartan (DIOVAN) 320 MG tablet Take 0.5 tablets (160 mg total) by mouth daily. 1 tablet 0   Vitamin D, Ergocalciferol, (DRISDOL) 1.25 MG (50000 UNIT) CAPS capsule TAKE 1 CAPSULE BY MOUTH ONE TIME PER WEEK 12 capsule 1   warfarin (COUMADIN) 5 MG tablet TAKE 5 MG ON SUNDAY, THEN TAKE HALF A TABLET AFTERWARDS 70 tablet 2   nitroGLYCERIN (NITROSTAT) 0.4 MG SL tablet Place 1 tablet (0.4 mg total) under the tongue every 5 (five) minutes as needed for chest pain. 30 tablet 3   pantoprazole (PROTONIX) 40 MG tablet Take 1 tablet (40 mg total) by mouth 2 (two) times daily before a meal. 60 tablet 2   No current facility-administered medications for this visit.    Allergies  Allergen Reactions   Lipitor [Atorvastatin]     Unknown   Morphine Nausea And Vomiting   Meperidine Nausea And Vomiting   Naproxen Sodium Nausea  And Vomiting    Social History   Socioeconomic History   Marital status: Married    Spouse name: Not on file   Number of children: 1   Years of education: Not on file   Highest education level: Not on file  Occupational History   Occupation: Optometrist    Employer: RETIRED   Occupation: Emergency planning/management officer   Occupation: school bus driver  Tobacco Use   Smoking status: Never   Smokeless tobacco: Never  Scientific laboratory technician Use: Never used  Substance and Sexual Activity   Alcohol use: No    Alcohol/week: 0.0 standard drinks   Drug use: No   Sexual activity: Not Currently  Other Topics Concern   Not on file  Social History Narrative   Not on file   Social Determinants of Health   Financial Resource Strain: Not on file  Food Insecurity: Not on file  Transportation Needs: Not on file  Physical Activity: Not on file  Stress: Not on file  Social Connections: Not on file  Intimate Partner Violence: Not on file    ROS  Objective   Vitals as reported by the patient: Today's Vitals   03/16/21 0958  BP: (!) 162/91  Pulse: 68  Temp: 100 F (37.8  C)  TempSrc: Temporal  Weight: 146 lb (66.2 kg)    Nyrie was seen today for generalized body aches.  Diagnoses and all orders for this visit:  Acute upper respiratory infection -     amoxicillin-clavulanate (AUGMENTIN) 500-125 MG tablet; Take 1 tablet (500 mg total) by mouth daily. -     azelastine (ASTELIN) 0.1 % nasal spray; Place 1 spray into both nostrils 2 (two) times daily. Use in each nostril as directed -     dextromethorphan-guaiFENesin (MUCINEX DM) 30-600 MG 12hr tablet; Take 1 tablet by mouth daily as needed for cough.    PLAN Given week long course, suspect bacterial infection. Will treat with abx given her multiple risks for poor outcome. Renal dosing on meds as above. Rest and hydration recommended. Follow up precautions reviewed with patient, who voices understanding. Patient encouraged to call clinic with any questions, comments, or concerns.   I discussed the assessment and treatment plan with the patient. The patient was provided an opportunity to ask questions and all were answered. The patient agreed with the plan and demonstrated an understanding of the instructions.   The patient was advised to call back or seek an in-person evaluation if the symptoms worsen or if the condition fails to improve as anticipated.  I provided 15 minutes of non-face-to-face time during this encounter.  Maximiano Coss, NP

## 2021-03-16 NOTE — Patient Instructions (Addendum)
Pre visit review using our clinic review tool, if applicable. No additional management support is needed unless otherwise documented below in the visit note.  Continue 1/2 tablet daily except take 1 tablet on Wednesdays and Saturdays. Re-check in 4 weeks.

## 2021-03-16 NOTE — Progress Notes (Signed)
Andrea Simmons [id number] 09/06/1940 80 y.o.  Subjective:   Patient ID:  Andrea Simmons is a 80 y.o. (DOB 1940/06/18) female.  Chief Complaint:  Chief Complaint  Patient presents with   Follow-up   Depression   Sleeping Problem    Depression        Associated symptoms include myalgias.  Associated symptoms include no decreased concentration, no appetite change and no suicidal ideas.  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    At visit was July 09, 2018.  Vraylar was discontinued for lack of response.  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  This was partially helpful.  When  seen Aug 15, 2018 and Ritalin was increased to 15 mg twice daily because of partial response at the lower dose.  At visit and of July 2020.  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  The Ritalin was increased again to 20 mg twice daily.  seen December 04, 2018 & 02/2019.  Doesn't like winter.  Seasonal depression and crying more.  There were no med changes.   11/17/19 appt with the following noted: BP been higher and seeing Card and having med changes. Stopped Vit D bc level was high. Some discussion about the Ritalin over the BP control. Some increase depression in part over health issues.  No marital problems.  No SI.  Issues with daughter are a stressor.  D said she was mean when D was growing up and was too strict.  Felt D was ungrateful and that she and H did the best they could do.  I can't get over that and has told D about this. Ritalin with less energizing benefit and less productive than she was..  Wears off around supper time.  At night cannot break habit of snacks at night. Does not feel it kick in and wear off.  Back to old habits of staying up too late, now MN.  Needs 7-8 hours.  Always needed more than others.    I feel  good taking it.  Most days feels pretty good.  Can have crying spells without reason even before the Ritalin.   Doing much more than I was.   H still sick often too. Plan: Reduce Ritalin to 10 mg twice daily due to loss of benefit and blood pressure Start change from duloxetine to Trintellix to help depression: Reduce duloxetine to 3 of 30 mg capsules and start Trintellix 5 mg daily for 1 week, Then reduce duloxetine to 2 of the 30 mg capsules and increase Trintellix to 10 mg daily for 1 week, Then increase Trintellix to 10 + 5 mg tablets and reduce duloxetine to 1 of the 30 mg capsules for 1 week, Then stop Duloxetine and increase Trintellix to 20 mg daily (or 2 of the 10 mg tablets)  01/14/2020 appointment with the following noted: Is on Trintellix 20 mg daily since 12/08/19 approximately. Was really hard with the switch.  Got really low and now some better.  Had a lot of aches and fatigue for awhile for 5 weeks.  Wonders if she was sick.  She doesn't feel like Trintellix is the right med. Gained 14# and still on low dose Ritalin.  Still having crying spells and poor energy but some days feels pretty good. No nausea or other SE noticed. A  lot of worry over her health and BP and H's cellulitis again. Plan: Overall patient has not improved with switch from duloxetine to Trintellix.  Her energy is not better and her mood is not better.  She is also quite anxious. Reduce Trintellix to 10 mg 1 daily and start viibryd 10 mg daily for a week, then  Stop Trintellix and increase Viibryd to 20 mg daily with food.  B[place]ga 01/23/20 wanting to stop Ritalin so she did.  01/30/20 TC : LM:  Suggested she restart vitamin D 2000 units daily.  Her vitamin D level is a little low and we would like to see it in the 50s.  It is hoped that dose will push back into the 50s and if there is a question we will recheck it in 3 months. She has not been on Viibryd 20 mg long enough to see the full benefit of that.  She needs to  continue Viibryd 20 mg for at least a full 4 weeks at that dosage so another 3 weeks or so.  At that point we could consider increasing it if needed.   03/10/20 appt with following noted: Ritalin didn't help and might have increased BP per card. On Viibryd about 6 weeks at 20 mg. Read on viibryd  And concerned about taking with coumadin. Gotten where she can't sleep well.  Last week only averaging 3 hours per night.  Also irritable a lot in last 10 days. Gained 17#. Hungry. Worries about things more than in the past and some of it is age. [name] and Monday diarrhea.  Otherwise just at times. Plan: DC Ritalin per her request. Poor response so far with Viibryd. Increase Viibryd to 30 mg for 1 week, then 40 mg daily.  04/28/2020 appt with following noted: A lot better but far from where I need to be. Still depressed. Can't sleep.  Catnap.  Taking viibryd in morning 40 mg for 5 weeks. Increase Viibryd to 40 mg daily.   3-4 hours sleep and no naps.  Not drowsy daytime. Ongoing tiredness chronically. Feels more tense and irritable from not sleeping.  Plan: continue Viibryd 40  06/17/2020 appt with following noted: vIIBRYD not helping. Trazodone 25 mg hallucinated bugs and screamed. H not doing well and that hasn't helped mood. Body aches and hurts all over. Asks about return to Cymbalta. Still gaining weight. Broken sleep with total of 6-8 hours Plan: CO poor effect with Viibryd and duloxetine did help FM more and pain. Wean Viibryd and restart duloxetine and increase to 90 mg daily (*took 120 mg before) Reduce Viibryd to 20 mg daily and start 1 of the duloxetine 30 mg daily for 5 days, Then continue Viibryd 20 mg daily and increase duloxetine to 2 of the 30 mg daily for 5 days, Then reduce Viibryd to 10 mg daily and increase duloxetine to 3 of the 30 mg daily for 5 days, Then stop Viibryd and continue duloxetine 3 daily. Rec trial mirtazapine 7.5 mg HS.  08/12/2020 appointment with the  following noted: I'm doing better with Cymbalta 90. "much, much better" Mirtazapine didn't work for [employer] but is sleeping now.  Laid down at MN and to sleep 2 A and up 845. Arthritis is bad and dx gout.  Joints in hands are bad.  Anxiety manageable. Depression got really bad before the switch in meds.  Pleased with result.  Seasonal depression starts in fall and until spring. Busy and worked with flowers. Pretty good now. Plan no  med changes.  Continue duloxetine 90 mg daily and mirtazapine for sleep  12/15/2020 appointment with the following noted: I'm doing really good.  Cymbalta is a success.  She's not worried about winter.   Ardyth Gal has not been well with cellulitis.  She's been through health problems.  Handles stressors well.   CO still poor sleep even with mirtazapine.  Will have 2-3 nights ok and other nights EFA.  Wants to sleep 11-8.  Seems she can't get relaxed and will worry at night.  Some nights pain interferes. Plan: Better with switch back to duloxetine 90 mg daily (*took 120 mg before) Okay trial of Lunesta 2 mg nightly with mirtazapine 7.5 mg nightly for chronic insomnia.  03/16/2021 appointment with the following noted: "Wonderful" cp to last year was depressed.  Pleased with duloxetine. May go 2-3 nights with poor sleep per week. Hangover if use Lunesta 2 with mirtazapine 7.5.  So stopped mirtazapine.  Johnnye Sima works sometime but no SE with it. D positive for Covid and didn't visit for Xmas.   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   Past Psychiatric Medication Trials: Rozerem, zolpidem,  Trazodone severe SE, mirtazapine 7.5, Lunesta 2  Duloxetine 120, Paxil 20 for years, Sertraline, fluoxetine, Wellbutrin,   Trintellix 20 5 weeks NR.  Viibryd 40  NR,  Low dose nortriptyline, Abilify weight gain and loss benefit, Was More talkative on the Abilify and the Wellbutrin.  lithium 2004 SE tremor at 636m daily.   Vraylar 40 NR, Buspar NR.  Review of  Systems:  Review of Systems  Constitutional:  Negative for appetite change and unexpected weight change.  HENT:  Positive for sinus pain.   Cardiovascular:  Negative for palpitations.  Gastrointestinal:  Negative for diarrhea.  Musculoskeletal:  Positive for arthralgias, back pain, joint swelling and myalgias. Negative for neck stiffness.  Neurological:  Negative for tremors, weakness and numbness.  Psychiatric/Behavioral:  Positive for dysphoric mood. Negative for agitation, behavioral problems, confusion, decreased concentration, hallucinations, self-injury, sleep disturbance and suicidal ideas. The patient is nervous/anxious. The patient is not hyperactive.   Hx anemia.    Cardiologist said she had a stiff heart.  History of pulmonary embolism and on Coumadin.  Medications: I have reviewed the patient's current medications.  Current Outpatient Medications  Medication Sig Dispense Refill   ACCU-CHEK GUIDE test strip CHECK BLOOD SUGARS DAILY 100 strip 12   Accu-Chek Softclix Lancets lancets USE AS INSTRUCTED TO TEST SUGARS TWICE A DAY 100 each 12   amLODipine (NORVASC) 10 MG tablet TAKE 1 TABLET BY MOUTH EVERY DAY 90 tablet 2   amoxicillin-clavulanate (AUGMENTIN) 500-125 MG tablet Take 1 tablet (500 mg total) by mouth daily. 10 tablet 0   Biotin 1000 MCG tablet Take 1,000 mcg by mouth daily.     Blood Glucose Monitoring Suppl (ACCU-CHEK GUIDE) w/Device KIT 1 each by Does not apply route 2 (two) times daily. To test sugars. Dx. E11.9 1 kit 1   Colchicine (MITIGARE) 0.6 MG CAPS Take 1 capsule by mouth 2 (two) times daily as needed. 60 capsule 3   dextromethorphan-guaiFENesin (MUCINEX DM) 30-600 MG 12hr tablet Take 1 tablet by mouth daily as needed for cough. 10 tablet 0   DULoxetine (CYMBALTA) 30 MG capsule Take 3 capsules (90 mg total) by mouth daily. 270 capsule 1   enoxaparin (LOVENOX) 60 MG/0.6ML injection Inject 0.6 mLs (60 mg total) into the skin daily. 12 mL 0   eszopiclone (LUNESTA)  2 MG TABS  tablet TAKE 1 TABLET (2 MG TOTAL) BY MOUTH AT BEDTIME AS NEEDED FOR SLEEP. TAKE IMMEDIATELY BEFORE BEDTIME 30 tablet 2   furosemide (LASIX) 20 MG tablet TAKE 1 TABLET BY MOUTH AS NEEDED (Patient taking differently: Take 20 mg by mouth daily as needed for edema.) 90 tablet 1   hydrALAZINE (APRESOLINE) 50 MG tablet Take 1 tablet (50 mg total) by mouth 3 (three) times daily. 90 tablet 3   labetalol (NORMODYNE) 200 MG tablet Take 1 tablet (200 mg total) by mouth 2 (two) times daily. 180 tablet 2   levothyroxine (SYNTHROID) 25 MCG tablet TAKE 1 TABLET BY MOUTH EVERY DAY BEFORE BREAKFAST 90 tablet 1   Magnesium 500 MG TABS Take 500 mg by mouth daily.     metFORMIN (GLUCOPHAGE-XR) 500 MG 24 hr tablet Take 500 mg by mouth daily with breakfast.  3   RESTASIS MULTIDOSE 0.05 % ophthalmic emulsion Place 1 drop into both eyes 2 (two) times daily.   6   rosuvastatin (CRESTOR) 20 MG tablet Take 20 mg by mouth daily.     valsartan (DIOVAN) 320 MG tablet Take 0.5 tablets (160 mg total) by mouth daily. 1 tablet 0   Vitamin D, Ergocalciferol, (DRISDOL) 1.25 MG (50000 UNIT) CAPS capsule TAKE 1 CAPSULE BY MOUTH ONE TIME PER WEEK 12 capsule 1   warfarin (COUMADIN) 5 MG tablet TAKE 5 MG ON SUNDAY, THEN TAKE HALF A TABLET AFTERWARDS 70 tablet 2   azelastine (ASTELIN) 0.1 % nasal spray Place 1 spray into both nostrils 2 (two) times daily. Use in each nostril as directed (Patient not taking: Reported on 03/16/2021) 30 mL 12   mirtazapine (REMERON) 7.5 MG tablet Take 1 tablet (7.5 mg total) by mouth at bedtime. (Patient not taking: Reported on 03/16/2021) 90 tablet 1   nitroGLYCERIN (NITROSTAT) 0.4 MG SL tablet Place 1 tablet (0.4 mg total) under the tongue every 5 (five) minutes as needed for chest pain. 30 tablet 3   pantoprazole (PROTONIX) 40 MG tablet Take 1 tablet (40 mg total) by mouth 2 (two) times daily before a meal. 60 tablet 2   No current facility-administered medications for this visit.    Medication  Side Effects: None talkative more the [facility]  Allergies:  Allergies  Allergen Reactions   Lipitor [Atorvastatin]     Unknown   Morphine Nausea And Vomiting   Meperidine Nausea And Vomiting   Naproxen Sodium Nausea And Vomiting    Past Medical History:  Diagnosis Date   Anemia    Anxiety    Breast cancer ([place])    Depression    Diabetes mellitus type II    Fibromyalgia    GERD (gastroesophageal reflux disease)    Hyperlipidemia    Hypertension    Malignant neoplasm of breast (female), unspecified site 1993   L breast s/p mastectomy and tamoxifen x 50yr   Other pulmonary embolism and infarction 2008 and 2009   chronic anticoag - [facility]   Unspecified hypothyroidism     Family History  Problem Relation Age of Onset   Depression Other        Parent   Arthritis Other        Parent, Grandparent   Hypertension Other        Grandparent   [place]lipidemia Other        [place]  Miscarriages / Stillbirths Other        Grandparent   Stroke Other        [facility]  Cancer Maternal Uncle        prostate   Hypertension Maternal Grandfather    Breast cancer Cousin    Breast cancer Cousin     Social History   Socioeconomic History   Marital status: Married    Spouse name: Not on file   Number of children: 1   Years of education: Not on file   Highest education level: Not on file  Occupational History   Occupation: Armed forces technical officer: RETIRED   Occupation: Emergency planning/management officer   Occupation: school bus driver  Tobacco Use   Smoking status: Never   Smokeless tobacco: Never  Scientific laboratory technician Use: Never used  Substance and Sexual Activity   Alcohol use: No    Alcohol/week: 0.0 standard drinks   Drug use: No   Sexual activity: Not Currently  Other Topics Concern   Not on file  Social History Narrative   Not on file   Social Determinants of Health   Financial Resource Strain: Not on file  Food Insecurity: Not on file  Transportation Needs: Not on file  Physical  Activity: Not on file  Stress: Not on file  Social Connections: Not on file  Intimate Partner Violence: Not on file    Past Medical History, Surgical history, Social history, and Family history were reviewed and updated as appropriate.   Please see review of systems for further details on the patient's review from today.   Objective:   Physical Exam:  There were no vitals taken for this visit.  Physical Exam Constitutional:      General: She is not in acute distress.    Appearance: She is well-developed.  Musculoskeletal:        General: No deformity.  Neurological:     Mental Status: She is alert and oriented to person, place, and time.     Cranial Nerves: No dysarthria.     Coordination: Coordination normal.  Psychiatric:        Attention and Perception: Attention normal. She is attentive.        Mood and Affect: Mood is anxious. Mood is not depressed. Affect is not labile, blunt, angry, tearful or inappropriate.        Speech: Speech normal. Speech is not rapid and pressured.        Behavior: Behavior normal. Behavior is cooperative.        Thought Content: Thought content normal. Thought content is not paranoid or delusional. Thought content does not include homicidal or suicidal ideation. Thought content does not include suicidal plan.        Cognition and Memory: Cognition and memory normal.        Judgment: Judgment normal.     Comments: Insight fair-good.   Depression is better with duloxetine Talkative    Lab Review:     Component Value Date/Time   NA 138 02/01/2021 1313   NA 139 12/27/2020 1237   NA 141 08/01/2016 1527   K 4.4 02/01/2021 1313   K 4.6 08/01/2016 1527   CL 104 02/01/2021 1313   CO2 24 02/01/2021 1313   CO2 24 08/01/2016 1527   GLUCOSE 101 (H) 02/01/2021 1313   GLUCOSE 93 08/01/2016 1527   BUN 42 (H) 02/01/2021 1313   BUN 30 (H) 12/27/2020 1237   BUN 19.9 08/01/2016 1527   CREATININE 2.13 (H) 02/01/2021 1313   CREATININE 0.9 08/01/2016  1527   CALCIUM 9.5 02/01/2021 1313   CALCIUM 9.7 08/01/2016 1527  PROT 6.7 02/01/2021 1313   PROT 7.2 08/01/2016 1527   PROT 6.8 08/01/2016 1527   ALBUMIN 4.4 02/01/2021 1313   ALBUMIN 4.0 08/01/2016 1527   AST 25 02/01/2021 1313   AST 41 (H) 08/01/2016 1527   ALT 18 02/01/2021 1313   ALT 25 08/01/2016 1527   ALKPHOS 36 (L) 02/01/2021 1313   ALKPHOS 48 08/01/2016 1527   BILITOT 0.5 02/01/2021 1313   BILITOT 0.33 08/01/2016 1527   GFRNONAA 34 (L) 09/21/2020 1820   GFRAA 76 05/28/2019 1609       Component Value Date/Time   WBC 7.2 02/01/2021 1313   RBC 3.61 (L) 02/01/2021 1313   HGB 10.9 (L) 02/01/2021 1313   HGB 9.5 (L) 08/01/2016 1527   HCT 32.6 (L) 02/01/2021 1313   HCT 31.2 (L) 08/01/2016 1527   PLT 221.0 02/01/2021 1313   PLT 221 08/01/2016 1527   MCV 90.5 02/01/2021 1313   MCV 83.9 08/01/2016 1527   MCH 31.7 09/21/2020 1820   MCHC 33.5 02/01/2021 1313   RDW 13.4 02/01/2021 1313   RDW 17.2 (H) 08/01/2016 1527   LYMPHSABS 0.9 02/01/2021 1313   LYMPHSABS 2.0 08/01/2016 1527   MONOABS 0.6 02/01/2021 1313   MONOABS 0.4 08/01/2016 1527   EOSABS 0.1 02/01/2021 1313   EOSABS 0.1 08/01/2016 1527   BASOSABS 0.0 02/01/2021 1313   BASOSABS 0.0 08/01/2016 1527    No results found for: POCLITH, LITHIUM   No results found for: PHENYTOIN, PHENOBARB, VALPROATE, CBMZ   Endo checked thyroid lately.   Vitamin D level on April 01, 2018 reported as 40.  After that vitamin D 50,000 units weekly was prescribed with a goal of achieving levels in the 50s and 60s.  December 04, 2018 vitamin D 25 off supplement .res Assessment: Plan:    Lowanda was seen today for follow-up, depression and sleeping problem.  Diagnoses and all orders for this visit:  Major depressive disorder, recurrent episode, moderate (HCC)  Seasonal depression ([place])  Panic disorder with agoraphobia  Generalized anxiety disorder  Insomnia due to mental condition  Delayed sleep phase  syndrome  Low vitamin D level     Greater than 50% of  30 min face to face time with patient was spent on counseling and coordination of care. Hx.TRD better again with duloxetine again.  We discussed options for dealing with this. Pt hx mixed sx so consider unconventional alternatives.  Discussed potential benefits, risks, and side effects of stimulants with patient to include increased heart rate, palpitations, insomnia, increased anxiety, increased irritability, or decreased appetite.  Instructed patient to contact office if experiencing any significant tolerability issues. She does not appear manic and not sig different from the last visit. Still talkative on it but not manic.  CO poor effect with Viibryd and duloxetine did help FM more and pain. Better with switch back to duloxetine 90 mg daily (*took 120 mg before)  DC mirtazapine 7.5 mg HS DT poor response and hangover Educated that she cannot expect to sleep solid through the night.  Explained.   Also disc sleep hygiene.   She's frustrated with sleep.  Trial of Bz bc worry is part of it but risk SE and risk tolerance.  Disc fall risk with sleep meds. Cont prn Lunesta 2 mg HS DT age.   She has chronic insomnia.  Samples Belsomra 10 & 15 mg given Disc SE.  Option counseling but availability is limited for options. But on list for [facility]  Repeat vitamin  D level was 47.  Previously high.  Restarted 2000 units daily.   Been off supplement since July 2021    FU 4-6 mos   Lynder Parents, MD, DFAPA   Please see After Visit Summary for patient specific instructions.  Future Appointments  Date Time Provider [place]  03/16/2021  3:00 PM [facility]  03/28/2021  2:00 PM [name], MD [facility]  07/27/2021  2:30 PM [name], Reynold Bowen, MD [facility] None  08/01/2021  1:30 PM [name], MD [facility]    No orders of the defined types were placed in this encounter.       -------------------------------

## 2021-03-16 NOTE — Progress Notes (Signed)
Continue 1/2 tablet daily except take 1 tablet on Wednesdays and Saturdays. Re-check in 4 weeks.

## 2021-03-16 NOTE — Patient Instructions (Signed)
° ° ° °  If you have lab work done today you will be contacted with your lab results within the next 2 weeks.  If you have not heard from us then please contact us. The fastest way to get your results is to register for My Chart. ° ° °IF you received an x-ray today, you will receive an invoice from [facility]. Please contact [facility] at [phone] with questions or concerns regarding your invoice.  ° °IF you received labwork today, you will receive an invoice from [facility]. Please contact [facility] at [phone] with questions or concerns regarding your invoice.  ° °Our billing staff will not be able to assist you with questions regarding bills from these companies. ° °You will be contacted with the lab results as soon as they are available. The fastest way to get your results is to activate your My Chart account. Instructions are located on the last page of this paperwork. If you have not heard from us regarding the results in 2 weeks, please contact this office. °  ° ° ° °

## 2021-03-22 DIAGNOSIS — L570 Actinic keratosis: Secondary | ICD-10-CM | POA: Diagnosis not present

## 2021-03-22 DIAGNOSIS — L218 Other seborrheic dermatitis: Secondary | ICD-10-CM | POA: Diagnosis not present

## 2021-03-22 DIAGNOSIS — Z85828 Personal history of other malignant neoplasm of skin: Secondary | ICD-10-CM | POA: Diagnosis not present

## 2021-03-22 DIAGNOSIS — D0461 Carcinoma in situ of skin of right upper limb, including shoulder: Secondary | ICD-10-CM | POA: Diagnosis not present

## 2021-03-22 DIAGNOSIS — C44629 Squamous cell carcinoma of skin of left upper limb, including shoulder: Secondary | ICD-10-CM | POA: Diagnosis not present

## 2021-03-24 DIAGNOSIS — I10 Essential (primary) hypertension: Secondary | ICD-10-CM | POA: Diagnosis not present

## 2021-03-27 ENCOUNTER — Encounter (HOSPITAL_COMMUNITY): Payer: Self-pay | Admitting: Emergency Medicine

## 2021-03-27 ENCOUNTER — Emergency Department (HOSPITAL_COMMUNITY)
Admission: EM | Admit: 2021-03-27 | Discharge: 2021-03-28 | Disposition: A | Discharge status: Home / Self Care | Payer: Medicare PPO | Attending: Emergency Medicine | Admitting: Emergency Medicine

## 2021-03-27 DIAGNOSIS — D538 Other specified nutritional anemias: Secondary | ICD-10-CM | POA: Insufficient documentation

## 2021-03-27 DIAGNOSIS — S199XXA Unspecified injury of neck, initial encounter: Secondary | ICD-10-CM | POA: Diagnosis not present

## 2021-03-27 DIAGNOSIS — D68318 Other hemorrhagic disorder due to intrinsic circulating anticoagulants, antibodies, or inhibitors: Secondary | ICD-10-CM | POA: Diagnosis not present

## 2021-03-27 DIAGNOSIS — M79622 Pain in left upper arm: Secondary | ICD-10-CM | POA: Insufficient documentation

## 2021-03-27 DIAGNOSIS — N189 Chronic kidney disease, unspecified: Secondary | ICD-10-CM | POA: Insufficient documentation

## 2021-03-27 DIAGNOSIS — N289 Disorder of kidney and ureter, unspecified: Secondary | ICD-10-CM | POA: Diagnosis not present

## 2021-03-27 DIAGNOSIS — M25512 Pain in left shoulder: Secondary | ICD-10-CM | POA: Insufficient documentation

## 2021-03-27 DIAGNOSIS — Z79899 Other long term (current) drug therapy: Secondary | ICD-10-CM | POA: Insufficient documentation

## 2021-03-27 DIAGNOSIS — D649 Anemia, unspecified: Secondary | ICD-10-CM | POA: Diagnosis not present

## 2021-03-27 DIAGNOSIS — E119 Type 2 diabetes mellitus without complications: Secondary | ICD-10-CM | POA: Insufficient documentation

## 2021-03-27 DIAGNOSIS — M4322 Fusion of spine, cervical region: Secondary | ICD-10-CM | POA: Diagnosis not present

## 2021-03-27 DIAGNOSIS — M542 Cervicalgia: Secondary | ICD-10-CM

## 2021-03-27 DIAGNOSIS — Z7901 Long term (current) use of anticoagulants: Secondary | ICD-10-CM

## 2021-03-27 DIAGNOSIS — I129 Hypertensive chronic kidney disease with stage 1 through stage 4 chronic kidney disease, or unspecified chronic kidney disease: Secondary | ICD-10-CM | POA: Diagnosis not present

## 2021-03-27 DIAGNOSIS — M62838 Other muscle spasm: Secondary | ICD-10-CM | POA: Diagnosis not present

## 2021-03-27 DIAGNOSIS — Z7902 Long term (current) use of antithrombotics/antiplatelets: Secondary | ICD-10-CM | POA: Diagnosis not present

## 2021-03-27 DIAGNOSIS — Z7984 Long term (current) use of oral hypoglycemic drugs: Secondary | ICD-10-CM | POA: Insufficient documentation

## 2021-03-27 DIAGNOSIS — M47812 Spondylosis without myelopathy or radiculopathy, cervical region: Secondary | ICD-10-CM | POA: Diagnosis not present

## 2021-03-27 DIAGNOSIS — Z981 Arthrodesis status: Secondary | ICD-10-CM | POA: Diagnosis not present

## 2021-03-27 NOTE — ED Triage Notes (Signed)
Pt c/o left sided neck pain since about 7pm tonight. Pt has tried 2 OTC creams with no improvement.

## 2021-03-28 ENCOUNTER — Other Ambulatory Visit: Payer: Self-pay

## 2021-03-28 ENCOUNTER — Telehealth: Payer: Self-pay

## 2021-03-28 ENCOUNTER — Emergency Department (HOSPITAL_COMMUNITY): Payer: Medicare PPO

## 2021-03-28 ENCOUNTER — Encounter: Payer: Self-pay | Admitting: Family Medicine

## 2021-03-28 ENCOUNTER — Ambulatory Visit: Payer: Medicare PPO | Admitting: Family Medicine

## 2021-03-28 ENCOUNTER — Ambulatory Visit: Payer: Medicare PPO | Admitting: Cardiology

## 2021-03-28 VITALS — BP 110/60 | HR 57 | Temp 98.1°F | Resp 16 | Wt 148.2 lb

## 2021-03-28 DIAGNOSIS — N179 Acute kidney failure, unspecified: Secondary | ICD-10-CM | POA: Diagnosis not present

## 2021-03-28 DIAGNOSIS — D649 Anemia, unspecified: Secondary | ICD-10-CM | POA: Diagnosis not present

## 2021-03-28 DIAGNOSIS — M47812 Spondylosis without myelopathy or radiculopathy, cervical region: Secondary | ICD-10-CM | POA: Diagnosis not present

## 2021-03-28 DIAGNOSIS — Z981 Arthrodesis status: Secondary | ICD-10-CM | POA: Diagnosis not present

## 2021-03-28 DIAGNOSIS — M542 Cervicalgia: Secondary | ICD-10-CM | POA: Diagnosis not present

## 2021-03-28 DIAGNOSIS — M4322 Fusion of spine, cervical region: Secondary | ICD-10-CM | POA: Diagnosis not present

## 2021-03-28 DIAGNOSIS — S2232XD Fracture of one rib, left side, subsequent encounter for fracture with routine healing: Secondary | ICD-10-CM

## 2021-03-28 DIAGNOSIS — S199XXA Unspecified injury of neck, initial encounter: Secondary | ICD-10-CM | POA: Diagnosis not present

## 2021-03-28 LAB — CBC WITH DIFFERENTIAL/PLATELET
Abs Immature Granulocytes: 0.03 10*3/uL (ref 0.00–0.07)
Basophils Absolute: 0 10*3/uL (ref 0.0–0.1)
Basophils Relative: 0 %
Eosinophils Absolute: 0.1 10*3/uL (ref 0.0–0.5)
Eosinophils Relative: 1 %
HCT: 29.5 % — ABNORMAL LOW (ref 36.0–46.0)
Hemoglobin: 9.8 g/dL — ABNORMAL LOW (ref 12.0–15.0)
Immature Granulocytes: 0 %
Lymphocytes Relative: 8 %
Lymphs Abs: 0.6 10*3/uL — ABNORMAL LOW (ref 0.7–4.0)
MCH: 31.4 pg (ref 26.0–34.0)
MCHC: 33.2 g/dL (ref 30.0–36.0)
MCV: 94.6 fL (ref 80.0–100.0)
Monocytes Absolute: 0.8 10*3/uL (ref 0.1–1.0)
Monocytes Relative: 11 %
Neutro Abs: 5.3 10*3/uL (ref 1.7–7.7)
Neutrophils Relative %: 80 %
Platelets: 133 10*3/uL — ABNORMAL LOW (ref 150–400)
RBC: 3.12 MIL/uL — ABNORMAL LOW (ref 3.87–5.11)
RDW: 13.2 % (ref 11.5–15.5)
WBC: 6.7 10*3/uL (ref 4.0–10.5)
nRBC: 0 % (ref 0.0–0.2)

## 2021-03-28 LAB — BASIC METABOLIC PANEL
Anion gap: 7 (ref 5–15)
BUN: 17 mg/dL (ref 8–23)
CO2: 22 mmol/L (ref 22–32)
Calcium: 8.8 mg/dL — ABNORMAL LOW (ref 8.9–10.3)
Chloride: 109 mmol/L (ref 98–111)
Creatinine, Ser: 1.45 mg/dL — ABNORMAL HIGH (ref 0.44–1.00)
GFR, Estimated: 37 mL/min — ABNORMAL LOW (ref >60)
Glucose, Bld: 106 mg/dL — ABNORMAL HIGH (ref 70–99)
Potassium: 4.6 mmol/L (ref 3.5–5.1)
Sodium: 138 mmol/L (ref 135–145)

## 2021-03-28 LAB — PROTIME-INR
INR: 2.6 — ABNORMAL HIGH (ref 0.8–1.2)
Prothrombin Time: 27.5 seconds — ABNORMAL HIGH (ref 11.4–15.2)

## 2021-03-28 MED ORDER — METHOCARBAMOL 1000 MG/10ML IJ SOLN
INTRAMUSCULAR | Status: AC
  Filled 2021-03-28: qty 10

## 2021-03-28 MED ORDER — METHOCARBAMOL 1000 MG/10ML IJ SOLN
1000.0000 mg | Freq: Once | INTRAVENOUS | Status: AC
  Administered 2021-03-28: 1000 mg via INTRAVENOUS
  Filled 2021-03-28: qty 10

## 2021-03-28 MED ORDER — HYDROCODONE-ACETAMINOPHEN 5-325 MG PO TABS
1.0000 | ORAL_TABLET | Freq: Once | ORAL | Status: AC
  Administered 2021-03-28: 1 via ORAL
  Filled 2021-03-28: qty 1

## 2021-03-28 MED ORDER — TIZANIDINE HCL 4 MG PO TABS: 4.0000 mg | ORAL_TABLET | Freq: Four times a day (QID) | ORAL | 0 refills | Status: DC | PRN

## 2021-03-28 MED ORDER — MORPHINE SULFATE (PF) 4 MG/ML IV SOLN
4.0000 mg | Freq: Once | INTRAVENOUS | Status: AC
  Administered 2021-03-28: 4 mg via INTRAVENOUS
  Filled 2021-03-28: qty 1

## 2021-03-28 MED ORDER — ONDANSETRON HCL 4 MG PO TABS: 4.0000 mg | ORAL_TABLET | Freq: Four times a day (QID) | ORAL | 0 refills | Status: DC | PRN

## 2021-03-28 MED ORDER — ONDANSETRON HCL 4 MG/2ML IJ SOLN
4.0000 mg | Freq: Once | INTRAMUSCULAR | Status: AC
  Administered 2021-03-28: 4 mg via INTRAVENOUS
  Filled 2021-03-28: qty 2

## 2021-03-28 MED ORDER — HYDROCODONE-ACETAMINOPHEN 5-325 MG PO TABS: 1.0000 | ORAL_TABLET | ORAL | 0 refills | Status: DC | PRN

## 2021-03-28 MED ORDER — FERROUS SULFATE 325 (65 FE) MG PO TABS: 325.0000 mg | ORAL_TABLET | Freq: Every day | ORAL | 1 refills | Status: DC

## 2021-03-28 NOTE — Discharge Instructions (Signed)
Apply ice for 30 minutes at a time, 4 times a day.  You may take acetaminophen as needed for less severe pain.  Talk with your doctor about possible referral for physical therapy.

## 2021-03-28 NOTE — Patient Instructions (Signed)
Follow up in 3-4 weeks to recheck your blood count and kidney function We'll call you to set up your physical therapy appt Drink lots of water Use the Tizanidine as needed for spasm Use a heating pad to for pain relief Use the Oxycodone as needed for severe pain Call w/ any questions or concerns Stay Safe!  Stay Healthy! Hang in there!!!

## 2021-03-28 NOTE — Telephone Encounter (Signed)
Caller name:[name]   Caller callback [phone]  Encourage patient to contact the pharmacy for refills or they can request refills through [facility]  (Please schedule appointment if patient has not been seen in over a year)  MEDICATION NAME & DOSE:ferrous sulfate 325 (65 FE) MG tablet   Notes/Comments from patient:This was originally prescribed by hospital Dr   WHAT PHARMACY WOULD THEY LIKE THIS SENT TO: [facility]   Please notify patient: It takes 48-72 hours to process rx refill requests Ask patient to call pharmacy to ensure rx is ready before heading there.   (CLINICAL TO FILL OR ROUTE PER PROTOCOLS)

## 2021-03-28 NOTE — ED Notes (Signed)
Pt given water at this time . Andrea Simmons

## 2021-03-28 NOTE — Telephone Encounter (Signed)
Rx sent 

## 2021-03-28 NOTE — ED Notes (Signed)
Reported vitals to RN and due to pain meds given

## 2021-03-28 NOTE — ED Provider Notes (Signed)
[facility] EMERGENCY DEPARTMENT Provider Note   CSN: [id number] Arrival date & time: 03/27/21  2343     History  Chief Complaint  Patient presents with   Neck Pain    Andrea Simmons is a 81 y.o. female.  The history is provided by the patient.  Neck Pain She has history of hypertension, diabetes, hyperlipidemia, pulmonary embolism anticoagulated on warfarin, cervical spine fusion surgery and comes in because of pain in the left side of the neck and proximal left shoulder which came on suddenly at 7 PM.  She denies any recent trauma or overuse.  Pain has radiated into the left upper arm on a couple of occasions but is not radiating there currently.  She denies any numbness or tingling.  She has not taken anything for pain.  Pain seems to be worse if she moves in certain ways, nothing makes it better.   Home Medications Prior to Admission medications   Medication Sig Start Date End Date Taking? Authorizing Provider  ACCU-CHEK GUIDE test strip CHECK BLOOD SUGARS DAILY 10/20/20   [name], MD  Accu-Chek Softclix Lancets lancets USE AS INSTRUCTED TO TEST SUGARS TWICE A DAY 08/18/20   [name], MD  amLODipine (NORVASC) 10 MG tablet TAKE 1 TABLET BY MOUTH EVERY DAY 02/28/21   [name], [name], MD  amoxicillin-clavulanate (AUGMENTIN) 500-125 MG tablet Take 1 tablet (500 mg total) by mouth daily. 03/16/21   Maximiano Coss, NP  azelastine (ASTELIN) 0.1 % nasal spray Place 1 spray into both nostrils 2 (two) times daily. Use in each nostril as directed Patient not taking: Reported on 03/16/2021 03/16/21   Maximiano Coss, NP  Biotin 1000 MCG tablet Take 1,000 mcg by mouth daily.    [provider]  Blood Glucose Monitoring Suppl (ACCU-CHEK GUIDE) w/Device KIT 1 each by Does not apply route 2 (two) times daily. To test sugars. Dx. E11.9 06/23/19   [name], MD  Colchicine (MITIGARE) 0.6 MG CAPS Take 1 capsule by mouth 2 (two) times daily as needed. 09/16/20    [name], Legrand Como, MD  dextromethorphan-guaiFENesin [facility] DM) 30-600 MG 12hr tablet Take 1 tablet by mouth daily as needed for cough. 03/16/21   Maximiano Coss, NP  DULoxetine (CYMBALTA) 30 MG capsule Take 3 capsules (90 mg total) by mouth daily. 12/15/20   [name], Billey Co., MD  enoxaparin (LOVENOX) 60 MG/0.6ML injection Inject 0.6 mLs (60 mg total) into the skin daily. 10/11/20   Brunetta Genera, MD  eszopiclone (LUNESTA) 2 MG TABS tablet TAKE 1 TABLET (2 MG TOTAL) BY MOUTH AT BEDTIME AS NEEDED FOR SLEEP. TAKE IMMEDIATELY BEFORE BEDTIME 01/11/21   [name], Billey Co., MD  furosemide (LASIX) 20 MG tablet TAKE 1 TABLET BY MOUTH AS NEEDED Patient taking differently: Take 20 mg by mouth daily as needed for edema. 08/27/19   [name], Reynold Bowen, MD  hydrALAZINE (APRESOLINE) 50 MG tablet Take 1 tablet (50 mg total) by mouth 3 (three) times daily. 12/16/20 03/16/21  [name], Reynold Bowen, MD  labetalol (NORMODYNE) 200 MG tablet Take 1 tablet (200 mg total) by mouth 2 (two) times daily. 01/20/21   [name], Reynold Bowen, MD  levothyroxine (SYNTHROID) 25 MCG tablet TAKE 1 TABLET BY MOUTH EVERY DAY BEFORE BREAKFAST 10/20/20   [name], MD  Magnesium 500 MG TABS Take 500 mg by mouth daily.    [provider]  metFORMIN (GLUCOPHAGE-XR) 500 MG 24 hr tablet Take 500 mg by mouth daily with breakfast.  03/23/16   [provider]  mirtazapine (REMERON) 7.5 MG tablet Take 1 tablet (7.5 mg total) by mouth at bedtime. Patient not taking: Reported on 03/16/2021 08/12/20   [name], Billey Co., MD  nitroGLYCERIN (NITROSTAT) 0.4 MG SL tablet Place 1 tablet (0.4 mg total) under the tongue every 5 (five) minutes as needed for chest pain. 10/31/18 03/03/21  [name], Reynold Bowen, MD  pantoprazole (PROTONIX) 40 MG tablet Take 1 tablet (40 mg total) by mouth 2 (two) times daily before a meal. 09/18/20 12/16/20  Manuella Ghazi, [name], DO  RESTASIS MULTIDOSE 0.05 % ophthalmic emulsion Place 1 drop into both eyes 2  (two) times daily.  06/21/16   [provider]  rosuvastatin (CRESTOR) 20 MG tablet Take 20 mg by mouth daily.    [provider]  valsartan (DIOVAN) 320 MG tablet Take 0.5 tablets (160 mg total) by mouth daily. 12/16/20   [name], Reynold Bowen, MD  Vitamin D, Ergocalciferol, (DRISDOL) 1.25 MG (50000 UNIT) CAPS capsule TAKE 1 CAPSULE BY MOUTH ONE TIME PER WEEK 02/14/21   [name], Billey Co., MD  warfarin (COUMADIN) 5 MG tablet TAKE 5 MG ON SUNDAY, THEN TAKE HALF A TABLET AFTERWARDS 11/17/20   Dutch Quint B, FNP      Allergies    Lipitor [atorvastatin], Morphine, Meperidine, and Naproxen sodium    Review of Systems   Review of Systems  Musculoskeletal:  Positive for neck pain.  All other systems reviewed and are negative.  Physical Exam Updated Vital Signs BP 139/60    Pulse 62    Temp 98.2 F (36.8 C)    Ht _0  (1.626 m)    Wt 66.2 kg    SpO2 99%    BMI 25.06 kg/m  Physical Exam Vitals and nursing note reviewed.  81 year old female, resting comfortably and in no acute distress. Vital signs are normal. Oxygen saturation is 99%, which is normal. Head is normocephalic and atraumatic. PERRLA, EOMI. Oropharynx is clear. Neck: Posterior midline scar is present, well-healed.  There is tenderness to palpation rather diffusely throughout the neck. Back: Nontender in the midline, no CVA tenderness.  Muscle spasm present of the left deltoid muscle with tenderness to palpation but no erythema or warmth. Lungs are clear without rales, wheezes, or rhonchi. Chest is nontender. Heart has regular rate and rhythm without murmur. Abdomen is soft, flat, nontender without masses or hepatosplenomegaly and peristalsis is normoactive. Extremities have no cyanosis or edema, full range of motion is present. Skin is warm and dry without rash. Neurologic: Mental status is normal, cranial nerves are intact, strength is 5/5 in all 4 extremities, sensory exam shows no deficits.  ED Results /  Procedures / Treatments   Labs (all labs ordered are listed, but only abnormal results are displayed) Labs Reviewed  BASIC METABOLIC PANEL - Abnormal; Notable for the following components:      Result Value   Glucose, Bld 106 (*)    Creatinine, Ser 1.45 (*)    Calcium 8.8 (*)    GFR, Estimated 37 (*)    All other components within normal limits  CBC WITH DIFFERENTIAL/PLATELET - Abnormal; Notable for the following components:   RBC 3.12 (*)    Hemoglobin 9.8 (*)    HCT 29.5 (*)    Platelets 133 (*)    Lymphs Abs 0.6 (*)    All other components within normal limits  PROTIME-INR - Abnormal; Notable for the following components:   Prothrombin Time 27.5 (*)  INR 2.6 (*)    All other components within normal limits    EKG EKG Interpretation  Date/Time:  Monday March 28 2021 00:03:57 EST Ventricular Rate:  55 PR Interval:  167 QRS Duration: 175 QT Interval:  507 QTC Calculation: 485 R Axis:   -69 Text Interpretation: Sinus rhythm RBBB and LAFB Baseline wander in lead(s) V6 When compared with ECG of 09/21/2020, No significant change was found Confirmed by Delora Fuel ([id number]) on 03/28/2021 12:10:07 AM  Radiology CT Cervical Spine Wo Contrast  Result Date: 03/28/2021 CLINICAL DATA:  Neck trauma. EXAM: CT CERVICAL SPINE WITHOUT CONTRAST TECHNIQUE: Multidetector CT imaging of the cervical spine was performed without intravenous contrast. Multiplanar CT image reconstructions were also generated. COMPARISON:  Cervical spine CT dated 06/07/2019. FINDINGS: Alignment: No acute subluxation. Skull base and vertebrae: No acute fracture. Osteopenia. C6-C7 posterior element fusion cerclage wire. Soft tissues and spinal canal: No prevertebral fluid or swelling. No visible canal hematoma. Disc levels:  Multilevel degenerative changes Upper chest: Negative. Other: None IMPRESSION: 1. No acute/traumatic cervical spine pathology. 2. Degenerative changes and C6-C7 posterior element fusion. Electronically  Signed   By: Anner Crete M.D.   On: 03/28/2021 03:10    Procedures Procedures    Medications Ordered in ED Medications  morphine 4 MG/ML injection 4 mg (has no administration in time range)  ondansetron (ZOFRAN) injection 4 mg (has no administration in time range)  methocarbamol (ROBAXIN) 1,000 mg in dextrose 5 % 100 mL IVPB (has no administration in time range)    ED Course/ Medical Decision Making/ A&P                           Medical Decision Making  Left neck and shoulder pain which actually seems to be centered in the deltoid muscle which is in significant spasm.  No evidence of any neurologic injury.  Old records are reviewed, and last INR was borderline elevated at 3.0, will recheck today.  Problem seems to be mainly when of muscle spasm, will give intravenous methocarbamol.  Cannot use NSAIDs and patient was anticoagulated.  INR is therapeutic at 2.6.  Renal function has improved slightly.  Anemia is present which is slightly worse than on 11/15 and will need to be followed as an outpatient.  Patient has noted slight improvement in her pain with above-noted treatment, but still rates pain at 7/10.  Will send for CT to further evaluate.  CT scan shows evidence of prior fusion, no acute injury.  Patient is advised of these findings.  She is discharged with prescriptions for tizanidine and a small number of oxycodone-acetaminophen tablets as well as a prescription for ondansetron.  Recommended applying ice several times a day, follow-up with PCP to consider referral for physical therapy.        Final Clinical Impression(s) / ED Diagnoses Final diagnoses:  Neck pain  Muscle spasm  Anticoagulated on warfarin  Renal insufficiency  Normochromic normocytic anemia    Rx / DC Orders ED Discharge Orders          Ordered    ondansetron (ZOFRAN) 4 MG tablet  Every 6 hours PRN        03/28/21 0341    HYDROcodone-acetaminophen (NORCO) 5-325 MG tablet  Every 4 hours PRN         03/28/21 0341    HYDROcodone-acetaminophen (NORCO) 5-325 MG tablet  Every 4 hours PRN        03/28/21 0341  tiZANidine (ZANAFLEX) 4 MG tablet  Every 6 hours PRN        03/28/21 9338              Delora Fuel, MD 82/66/66 [phone]

## 2021-03-28 NOTE — Assessment & Plan Note (Signed)
Pt has hx of similar.  Cr was up to 2.13 on 11/15 but had come down to 1.45 earlier this morning.  Encouraged good hydration and will have her return for repeat labs in a few weeks to trend Cr.  Pt expressed understanding and is in agreement w/ plan.

## 2021-03-28 NOTE — Assessment & Plan Note (Signed)
Ongoing issue for pt.  Hgb was 10.8 on 11/15 and down to 9.8 this morning.  Will have pt return in a few weeks to repeat CBC and trend hgb.  Pt expressed understanding and is in agreement w/ plan.
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
## 2021-03-28 NOTE — ED Notes (Signed)
Patient transported to CT 

## 2021-03-28 NOTE — ED Notes (Signed)
Patient back from CT.

## 2021-03-29 MED FILL — Hydrocodone-Acetaminophen Tab 5-325 MG: ORAL | Qty: 6 | Status: AC

## 2021-03-30 ENCOUNTER — Telehealth: Payer: Self-pay | Admitting: Psychiatry

## 2021-03-30 NOTE — Telephone Encounter (Signed)
Noted. DC belsomra.  Ok prn [employer]

## 2021-03-30 NOTE — Telephone Encounter (Signed)
Pt LVM saying she has called several times, but I don't see any encounters.  She said she had appt here 3 wks ago and Dr. Clovis Pu put her on a new med.  She said she took the med for 2 nights and then she was awake for 51 hours straight.  Next appt 6/8

## 2021-03-30 NOTE — Telephone Encounter (Signed)
Called patient and she said she only took 2 tablets of Belsomra and didn't sleep. She said she would try to stick it out with taking Lunesta every 2-3 days. She went to the ER Sunday due to neck/back pain. She said she is not having any depression.

## 2021-03-31 NOTE — Telephone Encounter (Signed)
Patient said she felt she could stick out the insomnia until she is seen in June. She said she will just continue to take the Lunesta prn.

## 2021-03-31 NOTE — Telephone Encounter (Signed)
Called patient, LVM to RC.

## 2021-04-04 ENCOUNTER — Telehealth: Payer: Self-pay | Admitting: Family Medicine

## 2021-04-04 NOTE — Telephone Encounter (Signed)
Initial Comment Caller states that she she was diagnosed with renal disease. Caller states that she has numbness, back pain. Translation No Nurse Assessment Nurse: Volanda Napoleon, RN, Wells Guiles Date/Time Eilene Ghazi Time): 04/01/2021 7:15:03 PM Confirm and document reason for call. If symptomatic, describe symptoms. ---Caller states she was seen at ED on saturday night because she was having spasms in neck and thigh. Caller states that she is now having numbness in both feet up to ankle Does the patient have any new or worsening symptoms? ---Yes Will a triage be completed? ---Yes Related visit to physician within the last 2 weeks? ---Yes Does the PT have any chronic conditions? (i.e. diabetes, asthma, this includes High risk factors for pregnancy, etc.) ---Unknown Is this a behavioral health or substance abuse call? ---No Guidelines Guideline Title Affirmed Question Affirmed Notes Nurse Date/Time (Eastern Time) Back Pain [1] Pain radiates into the thigh or further down the leg AND [2] both legs Jori Moll 04/01/2021 7:18:37 PM Disp. Time Eilene Ghazi Time) Disposition Final User PLEASE NOTE: All timestamps contained within this report are represented as [place] Standard Time. CONFIDENTIALTY NOTICE: This fax transmission is intended only for the addressee. It contains information that is legally privileged, confidential or otherwise protected from use or disclosure. If you are not the intended recipient, you are strictly prohibited from reviewing, disclosing, copying using or disseminating any of this information or taking any action in reliance on or regarding this information. If you have received this fax in error, please notify us immediately by telephone so that we can arrange for its return to [place]. Phone: [phone], Toll-Free: [phone], Fax: [phone] Page: 2 of 2 Call Id: [id number] 04/01/2021 7:26:55 PM See HCP within 4 Hours (or PCP triage) Yes Volanda Napoleon, RN, Romualdo Bolk  Disagree/Comply Comply Caller Understands Yes PreDisposition [place] Advice Given Per Guideline SEE HCP (OR PCP TRIAGE) WITHIN 4 HOURS: Comments User: Viviann Spare, RN Date/Time (Eastern Time): 04/01/2021 7:26:50 PM Before I was able to give care advice Caller states that paramedics were there and she had to hang up

## 2021-04-06 DIAGNOSIS — H16223 Keratoconjunctivitis sicca, not specified as Sjogren's, bilateral: Secondary | ICD-10-CM | POA: Diagnosis not present

## 2021-04-06 DIAGNOSIS — H524 Presbyopia: Secondary | ICD-10-CM | POA: Diagnosis not present

## 2021-04-06 DIAGNOSIS — M810 Age-related osteoporosis without current pathological fracture: Secondary | ICD-10-CM | POA: Diagnosis not present

## 2021-04-13 ENCOUNTER — Other Ambulatory Visit: Payer: Self-pay | Admitting: Psychiatry

## 2021-04-13 ENCOUNTER — Ambulatory Visit: Payer: Medicare PPO

## 2021-04-13 DIAGNOSIS — F5105 Insomnia due to other mental disorder: Secondary | ICD-10-CM

## 2021-04-13 DIAGNOSIS — G4721 Circadian rhythm sleep disorder, delayed sleep phase type: Secondary | ICD-10-CM

## 2021-04-14 DIAGNOSIS — M1811 Unilateral primary osteoarthritis of first carpometacarpal joint, right hand: Secondary | ICD-10-CM | POA: Diagnosis not present

## 2021-04-14 DIAGNOSIS — M1812 Unilateral primary osteoarthritis of first carpometacarpal joint, left hand: Secondary | ICD-10-CM | POA: Diagnosis not present

## 2021-04-14 DIAGNOSIS — M180 Bilateral primary osteoarthritis of first carpometacarpal joints: Secondary | ICD-10-CM | POA: Diagnosis not present

## 2021-04-15 DIAGNOSIS — N1832 Chronic kidney disease, stage 3b: Secondary | ICD-10-CM | POA: Diagnosis not present

## 2021-04-19 ENCOUNTER — Other Ambulatory Visit: Payer: Self-pay

## 2021-04-19 ENCOUNTER — Encounter: Payer: Self-pay | Admitting: Family Medicine

## 2021-04-19 ENCOUNTER — Other Ambulatory Visit: Payer: Self-pay | Admitting: Family Medicine

## 2021-04-19 ENCOUNTER — Telehealth (INDEPENDENT_AMBULATORY_CARE_PROVIDER_SITE_OTHER): Payer: Medicare PPO | Admitting: Family Medicine

## 2021-04-19 DIAGNOSIS — M542 Cervicalgia: Secondary | ICD-10-CM | POA: Diagnosis not present

## 2021-04-19 DIAGNOSIS — H04129 Dry eye syndrome of unspecified lacrimal gland: Secondary | ICD-10-CM | POA: Insufficient documentation

## 2021-04-19 NOTE — Progress Notes (Signed)
Virtual Visit via Video   I connected with patient on 04/19/21 at  1:00 PM EST by a video enabled telemedicine application and verified that I am speaking with the correct person using two identifiers.  Location patient: Home Location provider: [facility], Office Persons participating in the virtual visit: Patient, Provider, [place] Claiborne Billings C)  I discussed the limitations of evaluation and management by telemedicine and the availability of in person appointments. The patient expressed understanding and agreed to proceed.  Subjective:   HPI:   Neck pain- pt was seen 1/9 for neck pain and spasm.  At that time, she had been given Tizanidine and Oxycodone by the ER.  Pt reports neck pain and spasms have resolved.  Pt reports energy is improving.  She never started the PT that was ordered and she wonders if she still needs it.  ROS:   See pertinent positives and negatives per HPI.  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   Major depressive disorder, single episode, unspecified 12/10/2020   Osteoarthritis 12/10/2020   Esophageal dysphagia 09/16/2020   Dehydration 09/16/2020   AKI (acute kidney injury) ([place]) 09/16/2020   Elevated lipase 09/16/2020   Hyperglycemia due to diabetes mellitus ([place]) 09/16/2020   Vitamin D deficiency 10/01/2019   Polypharmacy 10/01/2019   Major depressive disorder, recurrent episode, moderate ([place]) 10/01/2019   Leg edema 06/04/2019   Chest pain of uncertain etiology 59/74/1638   Hx of pulmonary embolus 03/30/2017   Physical exam 11/30/2016   Hyperlipidemia 12/26/2013   Encounter for therapeutic drug monitoring 04/16/2013   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   Long term (current) use of  anticoagulants 06/30/2010   Exertional dyspnea 11/09/2008   Diabetes type 2, controlled ([place]) 08/27/2008   ANEMIA-NOS 08/27/2008   Seasonal and perennial allergic rhinitis 08/27/2008   PULMONARY NODULE 08/27/2008   Fibromyalgia 08/27/2008   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 ([place]) 03/20/2007   Hypothyroidism 03/20/2007   Essential hypertension 03/20/2007   Recurrent pulmonary emboli ([place]) 03/20/2007   GERD 03/20/2007   ESOPHAGEAL STRICTURE 03/08/2007    Social History   Tobacco Use   Smoking status: Never   Smokeless tobacco: Never  Substance Use Topics   Alcohol use: No    Alcohol/week: 0.0 standard drinks    Current Outpatient Medications:    ACCU-CHEK GUIDE test strip, CHECK BLOOD SUGARS DAILY, Disp: 100 strip, Rfl: 12   Accu-Chek Softclix Lancets lancets, USE AS INSTRUCTED TO TEST SUGARS TWICE A DAY, Disp: 100 each, Rfl: 12   amLODipine (NORVASC) 10 MG tablet, TAKE 1 TABLET BY MOUTH EVERY DAY, Disp: 90 tablet, Rfl: 2   Biotin 1000 MCG tablet, Take 1,000 mcg by mouth daily., Disp: , Rfl:    Blood Glucose Monitoring Suppl (ACCU-CHEK GUIDE) w/Device KIT, 1 each by Does not apply route 2 (two) times daily. To test sugars. Dx. E11.9, Disp: 1 kit, Rfl: 1   Colchicine (MITIGARE) 0.6 MG CAPS, Take 1 capsule by mouth 2 (two) times daily as needed., Disp: 60 capsule, Rfl: 3   DULoxetine (CYMBALTA) 30 MG capsule, Take 3 capsules (90 mg total) by mouth daily., Disp: 270 capsule, Rfl: 1   enoxaparin (LOVENOX) 60 MG/0.6ML injection, Inject 0.6 mLs (60 mg total) into the skin daily., Disp: 12 mL, Rfl: 0  eszopiclone (LUNESTA) 2 MG TABS tablet, TAKE 1 TABLET (2 MG TOTAL) BY MOUTH AT BEDTIME AS NEEDED FOR SLEEP. TAKE IMMEDIATELY BEFORE BEDTIME, Disp: 30 tablet, Rfl: 2   fenofibrate (TRICOR) 48 MG tablet, Take 48 mg by mouth daily., Disp: , Rfl:    ferrous sulfate 325 (65 FE) MG tablet, Take 1 tablet (325 mg total) by mouth daily., Disp: 90 tablet, Rfl: 1   FLAREX 0.1 % ophthalmic suspension,  Apply to eye., Disp: , Rfl:    furosemide (LASIX) 20 MG tablet, TAKE 1 TABLET BY MOUTH AS NEEDED (Patient taking differently: Take 20 mg by mouth daily as needed for edema.), Disp: 90 tablet, Rfl: 1   HYDROcodone-acetaminophen (NORCO) 5-325 MG tablet, Take 1 tablet by mouth every 4 (four) hours as needed for moderate pain., Disp: 6 tablet, Rfl: 0   HYDROcodone-acetaminophen (NORCO) 5-325 MG tablet, Take 1 tablet by mouth every 4 (four) hours as needed for moderate pain., Disp: 10 tablet, Rfl: 0   labetalol (NORMODYNE) 200 MG tablet, Take 1 tablet (200 mg total) by mouth 2 (two) times daily., Disp: 180 tablet, Rfl: 2   levothyroxine (SYNTHROID) 25 MCG tablet, TAKE 1 TABLET BY MOUTH EVERY DAY BEFORE BREAKFAST, Disp: 90 tablet, Rfl: 1   Magnesium 500 MG TABS, Take 500 mg by mouth daily., Disp: , Rfl:    metFORMIN (GLUCOPHAGE-XR) 500 MG 24 hr tablet, Take 500 mg by mouth daily with breakfast., Disp: , Rfl: 3   ondansetron (ZOFRAN) 4 MG tablet, Take 1 tablet (4 mg total) by mouth every 6 (six) hours as needed for nausea., Disp: 20 tablet, Rfl: 0   RESTASIS MULTIDOSE 0.05 % ophthalmic emulsion, Place 1 drop into both eyes 2 (two) times daily. , Disp: , Rfl: 6   rosuvastatin (CRESTOR) 20 MG tablet, Take 20 mg by mouth daily., Disp: , Rfl:    tiZANidine (ZANAFLEX) 4 MG tablet, Take 1 tablet (4 mg total) by mouth every 6 (six) hours as needed for muscle spasms., Disp: 40 tablet, Rfl: 0   valsartan (DIOVAN) 320 MG tablet, Take 0.5 tablets (160 mg total) by mouth daily., Disp: 1 tablet, Rfl: 0   Vitamin D, Ergocalciferol, (DRISDOL) 1.25 MG (50000 UNIT) CAPS capsule, TAKE 1 CAPSULE BY MOUTH ONE TIME PER WEEK, Disp: 12 capsule, Rfl: 1   warfarin (COUMADIN) 5 MG tablet, TAKE 5 MG ON SUNDAY, THEN TAKE HALF A TABLET AFTERWARDS, Disp: 70 tablet, Rfl: 2   hydrALAZINE (APRESOLINE) 50 MG tablet, Take 1 tablet (50 mg total) by mouth 3 (three) times daily., Disp: 90 tablet, Rfl: 3   nitroGLYCERIN (NITROSTAT) 0.4 MG SL  tablet, Place 1 tablet (0.4 mg total) under the tongue every 5 (five) minutes as needed for chest pain., Disp: 30 tablet, Rfl: 3   pantoprazole (PROTONIX) 40 MG tablet, Take 1 tablet (40 mg total) by mouth 2 (two) times daily before a meal., Disp: 60 tablet, Rfl: 2  Allergies  Allergen Reactions   Lipitor [Atorvastatin]     Unknown   Morphine Nausea And Vomiting   Meperidine Nausea And Vomiting   Naproxen Sodium Nausea And Vomiting    Objective:   There were no vitals taken for this visit. AAOx3, NAD NCAT, EOMI No obvious CN deficits Coloring WNL Pt is able to speak clearly, coherently without shortness of breath or increased work of breathing.  Thought process is linear.  Mood is appropriate.   Assessment and Plan:   Neck pain- pt states this is currently resolved but notes that '  it always comes back'.  Based on the fact that the pain is recurrent, I encouraged her to follow up w/ PT to see if there are any stretches or exercises that could prevent her from having such pain in the future.  Pt expressed understanding and is in agreement w/ plan.    Annye Asa, MD 04/19/2021

## 2021-04-20 ENCOUNTER — Ambulatory Visit (INDEPENDENT_AMBULATORY_CARE_PROVIDER_SITE_OTHER): Payer: Medicare PPO

## 2021-04-20 DIAGNOSIS — Z7901 Long term (current) use of anticoagulants: Secondary | ICD-10-CM | POA: Diagnosis not present

## 2021-04-20 LAB — POCT INR
INR: 3 (ref 2.0–3.0)
INR: 3.3 — AB (ref 2.0–3.0)

## 2021-04-20 NOTE — Progress Notes (Addendum)
Hold dose today and then continue 1/2 tablet daily except take 1 tablet on Wednesdays and Saturdays. Re-check in 2 weeks. Advised pt to contact office if any abnormal bruising or bleeding or if there is any worsening of her eye. Pt denied any other symptoms. Pt was in the ER for neck pain and pt developed a subconjunctival hemorrhage last night. Pt reports it does not look any worse today than it did last night. Pt reported she used to have this occur all the time throughout her lifetime. She contacted the pharmacist at her cardiologist office and he advised for her to contact her ophthalmologist. Pt did contact the ophthalmologist and she reported she would talk to the pt's PCP and if anything was needed she would contact the pt.

## 2021-04-20 NOTE — Patient Instructions (Addendum)
Pre visit review using our clinic review tool, if applicable. No additional management support is needed unless otherwise documented below in the visit note.  Hold dose today and then continue 1/2 tablet daily except take 1 tablet on Wednesdays and Saturdays. Re-check in 2 weeks. Contact office if any abnormal bruising or bleeding or if there is any worsening of your eye. Contact the coumadin clinic at [phone].

## 2021-04-21 ENCOUNTER — Ambulatory Visit (HOSPITAL_COMMUNITY): Payer: Medicare PPO | Admitting: Physical Therapy

## 2021-04-24 DIAGNOSIS — I10 Essential (primary) hypertension: Secondary | ICD-10-CM | POA: Diagnosis not present

## 2021-04-28 DIAGNOSIS — E039 Hypothyroidism, unspecified: Secondary | ICD-10-CM | POA: Diagnosis not present

## 2021-04-28 DIAGNOSIS — E785 Hyperlipidemia, unspecified: Secondary | ICD-10-CM | POA: Diagnosis not present

## 2021-04-28 DIAGNOSIS — E1122 Type 2 diabetes mellitus with diabetic chronic kidney disease: Secondary | ICD-10-CM | POA: Diagnosis not present

## 2021-04-28 DIAGNOSIS — N1832 Chronic kidney disease, stage 3b: Secondary | ICD-10-CM | POA: Diagnosis not present

## 2021-04-28 DIAGNOSIS — I129 Hypertensive chronic kidney disease with stage 1 through stage 4 chronic kidney disease, or unspecified chronic kidney disease: Secondary | ICD-10-CM | POA: Diagnosis not present

## 2021-04-28 DIAGNOSIS — Z86711 Personal history of pulmonary embolism: Secondary | ICD-10-CM | POA: Diagnosis not present

## 2021-05-02 ENCOUNTER — Encounter (HOSPITAL_COMMUNITY): Payer: Self-pay | Admitting: Physical Therapy

## 2021-05-02 ENCOUNTER — Other Ambulatory Visit: Payer: Self-pay

## 2021-05-02 ENCOUNTER — Other Ambulatory Visit: Payer: Self-pay | Admitting: Cardiology

## 2021-05-02 ENCOUNTER — Ambulatory Visit (HOSPITAL_COMMUNITY): Payer: Medicare PPO | Attending: Family Medicine | Admitting: Physical Therapy

## 2021-05-02 DIAGNOSIS — R293 Abnormal posture: Secondary | ICD-10-CM | POA: Insufficient documentation

## 2021-05-02 DIAGNOSIS — M542 Cervicalgia: Secondary | ICD-10-CM | POA: Diagnosis not present

## 2021-05-02 DIAGNOSIS — S2232XD Fracture of one rib, left side, subsequent encounter for fracture with routine healing: Secondary | ICD-10-CM | POA: Diagnosis not present

## 2021-05-02 DIAGNOSIS — I10 Essential (primary) hypertension: Secondary | ICD-10-CM

## 2021-05-02 NOTE — Therapy (Signed)
[place], [place], [address] Phone: [phone]   Fax:  [phone]  Physical Therapy Evaluation  Patient Details  Name: Andrea Simmons MRN: [id number] Date of Birth: 1941/01/03 Referring Provider (PT): Titus Dubin MD   Encounter Date: 05/02/2021   PT End of Session - 05/02/21 1142     Visit Number 1    Number of Visits 8    Date for PT Re-Evaluation 05/30/21    Authorization Type [employer]    Authorization Time Period check auth    PT Start Time 1540   late arrival   PT Stop Time 1200    PT Time Calculation (min) 35 min    Activity Tolerance Patient tolerated treatment well    Behavior During Therapy [facility] for tasks assessed/performed             Past Medical History:  Diagnosis Date   Anemia    Anxiety    Breast cancer ([place])    Depression    Diabetes mellitus type II    Fibromyalgia    GERD (gastroesophageal reflux disease)    Hyperlipidemia    Hypertension    Malignant neoplasm of breast (female), unspecified site 1993   L breast s/p mastectomy and tamoxifen x 88yrs   Other pulmonary embolism and infarction 2008 and 2009   chronic anticoag - [facility]   Unspecified hypothyroidism     Past Surgical History:  Procedure Laterality Date   APPENDECTOMY     BALLOON DILATION N/A 10/18/2020   Procedure: BALLOON DILATION;  Surgeon: Eloise Harman, DO;  Location[facility];  Service: Endoscopy;  Laterality: N/A;   BIOPSY  10/18/2020   Procedure: BIOPSY;  Surgeon: Eloise Harman, DO;  Location: [facility];  Service: Endoscopy;;  gastric    BREAST BIOPSY Left 1993   BREAST BIOPSY Right 09/25/2014   stero. Benign   BREAST RECONSTRUCTION Left    CATARACT EXTRACTION     ESOPHAGOGASTRODUODENOSCOPY (EGD) WITH PROPOFOL N/A 10/18/2020   Procedure: ESOPHAGOGASTRODUODENOSCOPY (EGD) WITH PROPOFOL;  Surgeon: Eloise Harman, DO;  Location: [facility];  Service: Endoscopy;  Laterality:  N/A;  11:00am   MASTECTOMY Left    ROTATOR CUFF REPAIR     SPINAL FUSION      x 2   TOTAL ABDOMINAL HYSTERECTOMY W/ BILATERAL SALPINGOOPHORECTOMY     TUBAL LIGATION      There were no vitals filed for this visit.    Subjective Assessment - 05/02/21 1132     Subjective Patient presents to therapy with complaint of neck pain beginning after a fall about 2 months ago. She fell at her house in the dark, she landed on a magazine rack and injured her ribs. She says her ribs are much better now, but is still having neck pain and stiffness, notably on LT side. She has taken some medication but isnt taking these regularly anymore.    Pertinent History C4-5 fusion    Limitations Lifting;House hold activities;Sitting    Patient Stated Goals Get the pain gone    Currently in Pain? Yes    Pain Score 2     Pain Location Neck    Pain Orientation Left    Pain Descriptors / Indicators Aching;Sharp    Pain Type Acute pain    Pain Onset More than a month ago    Pain Frequency Intermittent    Aggravating Factors  "daily routine work"    Pain  Relieving Factors relaxing and pain creams    Effect of Pain on Daily Activities Limits                [facility] PT Assessment - 05/02/21 0001       Assessment   Medical Diagnosis neck pain    Referring Provider (PT) Titus Dubin MD    Prior Therapy Yes      Precautions   Precautions None      Restrictions   Weight Bearing Restrictions No      Balance Screen   Has the patient fallen in the past 6 months Yes    How many times? 8    Has the patient had a decrease in activity level because of a fear of falling?  No    Is the patient reluctant to leave their home because of a fear of falling?  No      Home Ecologist residence      Prior Function   Level of Independence Independent      Cognition   Overall Cognitive Status Within Functional Limits for tasks assessed      Observation/Other Assessments    Focus on Therapeutic Outcomes (FOTO)  Held per time constraint      Posture/Postural Control   Posture/Postural Control Postural limitations    Postural Limitations Rounded Shoulders;Forward head      ROM / Strength   AROM / PROM / Strength AROM;Strength      AROM   Overall AROM Comments Min restriction in LT shoulder elevation    AROM Assessment Site Cervical    Cervical Flexion 35    Cervical Extension 12    Cervical - Right Rotation 55    Cervical - Left Rotation 24      Strength   Strength Assessment Site Shoulder    Right/Left Shoulder Right;Left    Right Shoulder Flexion 4+/5    Right Shoulder ABduction 4+/5    Right Shoulder External Rotation 4+/5    Left Shoulder Flexion 4+/5    Left Shoulder ABduction 4/5    Left Shoulder External Rotation 4/5      Palpation   Palpation comment Mod tenderness to palpation in bilateral upper trap                        Objective measurements completed on examination: See above findings.       [facility] Adult PT Treatment/Exercise - 05/02/21 0001       Exercises   Exercises Neck      Neck Exercises: Supine   Other Supine Exercise scap retraction x10, chin tuck x10                     PT Education - 05/02/21 1136     Education Details On evaluation findings, POC and HEP    Person(s) Educated Patient    Methods Handout;Explanation    Comprehension Verbalized understanding              PT Short Term Goals - 05/02/21 1200       PT SHORT TERM GOAL #1   Title Patient will be independent with initial HEP and self-management strategies to improve functional outcomes    Time 2    Period Weeks    Status New    Target Date 05/16/21               PT Long Term Goals -  05/02/21 1200       PT LONG TERM GOAL #1   Title Patient will be independent with advanced HEP and self-management strategies to improve functional outcomes    Time 4    Period Weeks    Status New    Target Date  05/30/21      PT LONG TERM GOAL #2   Title Patient improve LT cervical rotation by 15 degrees in order to improve ability to scan environment for safety and while driving.    Time 4    Period Weeks    Status New    Target Date 05/30/21      PT LONG TERM GOAL #3   Title Patient will improve FOTO score to predicted value to indicate improvement in functional outcomes    Time 4    Period Weeks    Status New    Target Date 05/30/21      PT LONG TERM GOAL #4   Title Patient will report at least 60% overall improvement in subjective complaint to indicate improvement in ability to perform ADLs.    Time 4    Period Weeks    Status New    Target Date 05/30/21                    Plan - 05/02/21 1152     Clinical Impression Statement Patient is a 81 y.o. female who presents to physical therapy with complaint of neck pain. Patient demonstrates ROM restriction, reduced flexibility, increased tenderness to palpation and postural abnormalities which are likely contributing to symptoms of pain and are negatively impacting patient ability to perform ADLs. Patient will benefit from skilled physical therapy services to address these deficits to reduce pain and improve level of function with ADLs    Stability/Clinical Decision Making Stable/Uncomplicated    Clinical Decision Making Low    Rehab Potential Good    PT Frequency 2x / week    PT Duration 4 weeks    PT Treatment/Interventions ADLs/Self Care Home Management;Ultrasound;Parrafin;Neuromuscular re-education;Compression bandaging;Visual/perceptual remediation/compensation;Fluidtherapy;Contrast Bath;Patient/family education;Scar mobilization;Passive range of motion;Joint Manipulations;Dry needling;Orthotic Fit/Training;DME Instruction;Aquatic Therapy;Biofeedback;Cryotherapy;Therapeutic activities;Iontophoresis 4mg /ml Dexamethasone;Electrical Stimulation;Splinting;Energy conservation;Taping;Vasopneumatic Device;Manual techniques;Therapeutic  exercise;Moist Heat;Traction;Balance training;Manual lymph drainage;Functional mobility training;Stair training;Gait training;Spinal Manipulations    PT Next Visit Plan Review HEP. Complete FOTO. Progress postural strengthening and cervical mobility as tolerated. STM to upper trap and cervical muscles    PT Home Exercise Plan Eval: chin tuck, scap retraction, cervical rotation    Consulted and Agree with Plan of Care Patient             Patient will benefit from skilled therapeutic intervention in order to improve the following deficits and impairments:  Impaired flexibility, Postural dysfunction, Decreased range of motion, Improper body mechanics, Impaired perceived functional ability, Pain, Impaired UE functional use, Increased fascial restricitons, Decreased strength, Decreased activity tolerance  Visit Diagnosis: Cervicalgia  Abnormal posture     Problem List Patient Active Problem List   Diagnosis Date Noted   Tear film insufficiency 04/19/2021   Sjogren's disease ([place]) 01/24/2021   Erroneous encounter - disregard 01/21/2021   Abnormal gait 12/10/2020   Hereditary and idiopathic neuropathy, unspecified 12/10/2020   Major depressive disorder, single episode, unspecified 12/10/2020   Osteoarthritis 12/10/2020   Esophageal dysphagia 09/16/2020   Dehydration 09/16/2020   AKI (acute kidney injury) ([place]) 09/16/2020   Elevated lipase 09/16/2020   Hyperglycemia due to diabetes mellitus ([place]) 09/16/2020   Vitamin D deficiency 10/01/2019   Polypharmacy 10/01/2019  Major depressive disorder, recurrent episode, moderate ([place]) 10/01/2019   Leg edema 06/04/2019   Chest pain of uncertain etiology 09/40/7680   Hx of pulmonary embolus 03/30/2017   Physical exam 11/30/2016   Hyperlipidemia 12/26/2013   Encounter for therapeutic drug monitoring 04/16/2013   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   Long term (current) use of anticoagulants 06/30/2010    Exertional dyspnea 11/09/2008   Diabetes type 2, controlled ([place]) 08/27/2008   ANEMIA-NOS 08/27/2008   Seasonal and perennial allergic rhinitis 08/27/2008   PULMONARY NODULE 08/27/2008   Fibromyalgia 08/27/2008   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 ([place]) 03/20/2007   Hypothyroidism 03/20/2007   Essential hypertension 03/20/2007   Recurrent pulmonary emboli ([place]) 03/20/2007   GERD 03/20/2007   ESOPHAGEAL STRICTURE 03/08/2007   12:05 PM, 05/02/21 Josue Hector PT DPT  Physical Therapist with [facility]  [phone] [place], [place], [address] Phone: [phone]   Fax:  [phone]  Name: JAZZY PARMER MRN: [id number] Date of Birth: [birth year]

## 2021-05-02 NOTE — Patient Instructions (Signed)
Access Code: [email or web address] URL: [email or web address] Date: 05/02/2021 Prepared by: Josue Hector  Exercises Seated Scapular Retraction - 3 x daily - 7 x weekly - 1-2 sets - 10 reps - 3 second hold Seated Cervical Retraction - 3 x daily - 7 x weekly - 1-2 sets - 10 reps - 3 second hold Seated Cervical Rotation AROM - 3 x daily - 7 x weekly - 1-2 sets - 10 reps

## 2021-05-04 ENCOUNTER — Ambulatory Visit (INDEPENDENT_AMBULATORY_CARE_PROVIDER_SITE_OTHER): Payer: Medicare PPO

## 2021-05-04 DIAGNOSIS — Z7901 Long term (current) use of anticoagulants: Secondary | ICD-10-CM | POA: Diagnosis not present

## 2021-05-04 LAB — POCT INR: INR: 2 (ref 2.0–3.0)

## 2021-05-04 NOTE — Patient Instructions (Addendum)
Pre visit review using our clinic review tool, if applicable. No additional management support is needed unless otherwise documented below in the visit note.  Continue 1/2 tablet daily except take 1 tablet on Wednesdays and Saturdays. Re-check in 4 weeks.

## 2021-05-04 NOTE — Progress Notes (Signed)
Continue 1/2 tablet daily except take 1 tablet on Wednesdays and Saturdays. Re-check in 4 weeks.

## 2021-05-16 ENCOUNTER — Ambulatory Visit (HOSPITAL_COMMUNITY): Payer: Medicare PPO | Admitting: Physical Therapy

## 2021-05-17 ENCOUNTER — Encounter (HOSPITAL_COMMUNITY): Payer: Medicare PPO | Admitting: Physical Therapy

## 2021-05-19 DIAGNOSIS — G609 Hereditary and idiopathic neuropathy, unspecified: Secondary | ICD-10-CM | POA: Diagnosis not present

## 2021-05-19 DIAGNOSIS — I10 Essential (primary) hypertension: Secondary | ICD-10-CM | POA: Diagnosis not present

## 2021-05-19 DIAGNOSIS — E7800 Pure hypercholesterolemia, unspecified: Secondary | ICD-10-CM | POA: Diagnosis not present

## 2021-05-19 DIAGNOSIS — E1165 Type 2 diabetes mellitus with hyperglycemia: Secondary | ICD-10-CM | POA: Diagnosis not present

## 2021-05-19 DIAGNOSIS — E039 Hypothyroidism, unspecified: Secondary | ICD-10-CM | POA: Diagnosis not present

## 2021-05-19 DIAGNOSIS — M810 Age-related osteoporosis without current pathological fracture: Secondary | ICD-10-CM | POA: Diagnosis not present

## 2021-05-24 ENCOUNTER — Encounter (HOSPITAL_COMMUNITY): Payer: Medicare PPO | Admitting: Physical Therapy

## 2021-05-24 DIAGNOSIS — I10 Essential (primary) hypertension: Secondary | ICD-10-CM | POA: Diagnosis not present

## 2021-05-26 ENCOUNTER — Encounter (HOSPITAL_COMMUNITY): Payer: Medicare PPO | Admitting: Physical Therapy

## 2021-05-27 ENCOUNTER — Ambulatory Visit: Payer: Medicare PPO | Admitting: Family Medicine

## 2021-05-31 ENCOUNTER — Encounter (HOSPITAL_COMMUNITY): Payer: Medicare PPO

## 2021-06-01 ENCOUNTER — Ambulatory Visit (INDEPENDENT_AMBULATORY_CARE_PROVIDER_SITE_OTHER): Payer: Medicare PPO

## 2021-06-01 DIAGNOSIS — Z7901 Long term (current) use of anticoagulants: Secondary | ICD-10-CM | POA: Diagnosis not present

## 2021-06-01 LAB — POCT INR: INR: 1.9 — AB (ref 2.0–3.0)

## 2021-06-01 NOTE — Patient Instructions (Addendum)
Pre visit review using our clinic review tool, if applicable. No additional management support is needed unless otherwise documented below in the visit note. ? ?Increase dose today to take 1 1/2 tablets and then continue 1/2 tablet daily except take 1 tablet on Wednesdays and Saturdays. Re-check in 4 weeks.  ?

## 2021-06-01 NOTE — Progress Notes (Signed)
Increase dose today to take 1 1/2 tablets and then continue 1/2 tablet daily except take 1 tablet on Wednesdays and Saturdays. Re-check in 4 weeks.  ?

## 2021-06-02 ENCOUNTER — Encounter (HOSPITAL_COMMUNITY): Payer: Medicare PPO | Admitting: Physical Therapy

## 2021-06-24 DIAGNOSIS — I10 Essential (primary) hypertension: Secondary | ICD-10-CM | POA: Diagnosis not present

## 2021-06-29 ENCOUNTER — Ambulatory Visit (INDEPENDENT_AMBULATORY_CARE_PROVIDER_SITE_OTHER): Payer: Medicare PPO

## 2021-06-29 DIAGNOSIS — Z7901 Long term (current) use of anticoagulants: Secondary | ICD-10-CM

## 2021-06-29 LAB — POCT INR: INR: 2.2 (ref 2.0–3.0)

## 2021-06-29 NOTE — Progress Notes (Addendum)
Continue 1/2 tablet daily except take 1 tablet on Wednesdays and Saturdays. Re-check in 5 weeks per pt request.  ?

## 2021-06-29 NOTE — Patient Instructions (Addendum)
Pre visit review using our clinic review tool, if applicable. No additional management support is needed unless otherwise documented below in the visit note. ? ?Continue 1/2 tablet daily except take 1 tablet on Wednesdays and Saturdays. Re-check in 5 weeks.  ?

## 2021-07-17 ENCOUNTER — Emergency Department (HOSPITAL_COMMUNITY): Payer: Medicare PPO

## 2021-07-17 ENCOUNTER — Emergency Department (HOSPITAL_COMMUNITY)
Admission: EM | Admit: 2021-07-17 | Discharge: 2021-07-17 | Disposition: A | Discharge status: Home / Self Care | Payer: Medicare PPO | Attending: Emergency Medicine | Admitting: Emergency Medicine

## 2021-07-17 ENCOUNTER — Encounter (HOSPITAL_COMMUNITY): Payer: Self-pay | Admitting: *Deleted

## 2021-07-17 ENCOUNTER — Other Ambulatory Visit: Payer: Self-pay

## 2021-07-17 DIAGNOSIS — M19032 Primary osteoarthritis, left wrist: Secondary | ICD-10-CM | POA: Diagnosis not present

## 2021-07-17 DIAGNOSIS — I10 Essential (primary) hypertension: Secondary | ICD-10-CM | POA: Insufficient documentation

## 2021-07-17 DIAGNOSIS — E119 Type 2 diabetes mellitus without complications: Secondary | ICD-10-CM | POA: Diagnosis not present

## 2021-07-17 DIAGNOSIS — Z7984 Long term (current) use of oral hypoglycemic drugs: Secondary | ICD-10-CM | POA: Diagnosis not present

## 2021-07-17 DIAGNOSIS — Z7901 Long term (current) use of anticoagulants: Secondary | ICD-10-CM | POA: Diagnosis not present

## 2021-07-17 DIAGNOSIS — Z79899 Other long term (current) drug therapy: Secondary | ICD-10-CM | POA: Diagnosis not present

## 2021-07-17 DIAGNOSIS — M1812 Unilateral primary osteoarthritis of first carpometacarpal joint, left hand: Secondary | ICD-10-CM | POA: Diagnosis not present

## 2021-07-17 DIAGNOSIS — E039 Hypothyroidism, unspecified: Secondary | ICD-10-CM | POA: Insufficient documentation

## 2021-07-17 DIAGNOSIS — M79642 Pain in left hand: Secondary | ICD-10-CM | POA: Insufficient documentation

## 2021-07-17 DIAGNOSIS — M1990 Unspecified osteoarthritis, unspecified site: Secondary | ICD-10-CM

## 2021-07-17 MED ORDER — ONDANSETRON 8 MG PO TBDP
8.0000 mg | ORAL_TABLET | Freq: Once | ORAL | Status: AC
  Administered 2021-07-17: 8 mg via ORAL
  Filled 2021-07-17: qty 1

## 2021-07-17 MED ORDER — OXYCODONE-ACETAMINOPHEN 5-325 MG PO TABS
1.0000 | ORAL_TABLET | Freq: Once | ORAL | Status: AC
  Administered 2021-07-17: 1 via ORAL
  Filled 2021-07-17: qty 1

## 2021-07-17 NOTE — ED Triage Notes (Signed)
Pt with carpal tunnel syndrome and arthritis to left hand.  Pt with brace in place at time of triage. +gout.  Severe pain to left  hand since Friday night, chronic pain.  Needs surgery soon.  ?

## 2021-07-17 NOTE — ED Provider Notes (Signed)
?[facility] ?Provider Note ? ? ?CSN: [id number] ?Arrival date & time: 07/17/21  1612 ? ?  ? ?History ? ?No chief complaint on file. ? ? ?Andrea Simmons is a 81 y.o. female. ? ?HPI ? ?Patient with medical history notable for carpal tunnel, type 2 diabetes, chronic anticoagulation, hypertension, hyperlipidemia, hypothyroid, osteoarthritis, Sjogren's syndrome presents today with left hand pain.  Patient states she had pain like this previously, typically attributes it to gout versus carpal tunnel syndrome.  She is followed by rheumatology, in the past she has been given injections which helped improved her symptoms.  States that she has pain to the left hand mostly on the radial side for 3 days, is constant but worse with any movement.  It feels like a shooting pain that moves up to her elbow.  Denies recent falls or injuries.  She has been taking Tylenol at home and wearing her wrist brace with minimal relief. ? ?Home Medications ?Prior to Admission medications   ?Medication Sig Start Date End Date Taking? Authorizing Provider  ?ACCU-CHEK GUIDE test strip CHECK BLOOD SUGARS DAILY 10/20/20   [name], MD  ?Accu-Chek Softclix Lancets lancets USE AS INSTRUCTED TO TEST SUGARS TWICE A DAY 08/18/20   [name], MD  ?amLODipine (NORVASC) 10 MG tablet TAKE 1 TABLET BY MOUTH EVERY DAY 02/28/21   [name], Reynold Bowen, MD  ?Biotin 1000 MCG tablet Take 1,000 mcg by mouth daily.    [provider]  ?Blood Glucose Monitoring Suppl (ACCU-CHEK GUIDE) w/Device KIT 1 each by Does not apply route 2 (two) times daily. To test sugars. Dx. E11.9 06/23/19   [name], MD  ?Colchicine (MITIGARE) 0.6 MG CAPS Take 1 capsule by mouth 2 (two) times daily as needed. 09/16/20   [name], Legrand Como, MD  ?DULoxetine (CYMBALTA) 30 MG capsule Take 3 capsules (90 mg total) by mouth daily. 12/15/20   [name], Billey Co., MD  ?enoxaparin (LOVENOX) 60 MG/0.6ML injection Inject 0.6 mLs (60 mg total) into the  skin daily. 10/11/20   Brunetta Genera, MD  ?eszopiclone (LUNESTA) 2 MG TABS tablet TAKE 1 TABLET (2 MG TOTAL) BY MOUTH AT BEDTIME AS NEEDED FOR SLEEP. TAKE IMMEDIATELY BEFORE BEDTIME 04/13/21   [name], Billey Co., MD  ?fenofibrate (TRICOR) 48 MG tablet Take 48 mg by mouth daily. 01/06/21   [provider]  ?ferrous sulfate 325 (65 FE) MG tablet Take 1 tablet (325 mg total) by mouth daily. 03/28/21   [name], MD  ?FLAREX 0.1 % ophthalmic suspension Apply to eye. 02/09/21   [provider]  ?furosemide (LASIX) 20 MG tablet TAKE 1 TABLET BY MOUTH AS NEEDED ?Patient taking differently: Take 20 mg by mouth daily as needed for edema. 08/27/19   [name], Reynold Bowen, MD  ?hydrALAZINE (APRESOLINE) 50 MG tablet TAKE 1 TABLET BY MOUTH THREE TIMES A DAY 05/02/21   [name], [name], MD  ?HYDROcodone-acetaminophen (NORCO) 5-325 MG tablet Take 1 tablet by mouth every 4 (four) hours as needed for moderate pain. 09/25/27   Delora Fuel, MD  ?HYDROcodone-acetaminophen [facility]) 5-325 MG tablet Take 1 tablet by mouth every 4 (four) hours as needed for moderate pain. 07/24/19   Delora Fuel, MD  ?labetalol (NORMODYNE) 200 MG tablet Take 1 tablet (200 mg total) by mouth 2 (two) times daily. 01/20/21   [name], Reynold Bowen, MD  ?levothyroxine (SYNTHROID) 25 MCG tablet TAKE 1 TABLET BY MOUTH EVERY DAY BEFORE BREAKFAST 04/19/21   [name],  MD  ?Magnesium 500 MG TABS Take 500 mg by mouth daily.    [provider]  ?metFORMIN (GLUCOPHAGE-XR) 500 MG 24 hr tablet Take 500 mg by mouth daily with breakfast. 03/23/16   [provider]  ?nitroGLYCERIN (NITROSTAT) 0.4 MG SL tablet Place 1 tablet (0.4 mg total) under the tongue every 5 (five) minutes as needed for chest pain. 10/31/18 03/03/21  [name], Reynold Bowen, MD  ?ondansetron (ZOFRAN) 4 MG tablet Take 1 tablet (4 mg total) by mouth every 6 (six) hours as needed for nausea. 0/2/54   Delora Fuel, MD  ?pantoprazole (PROTONIX) 40 MG tablet  Take 1 tablet (40 mg total) by mouth 2 (two) times daily before a meal. 09/18/20 12/16/20  Manuella Ghazi, [name], DO  ?RESTASIS MULTIDOSE 0.05 % ophthalmic emulsion Place 1 drop into both eyes 2 (two) times daily.  06/21/16   [provider]  ?rosuvastatin (CRESTOR) 20 MG tablet Take 20 mg by mouth daily.    [provider]  ?tiZANidine (ZANAFLEX) 4 MG tablet Take 1 tablet (4 mg total) by mouth every 6 (six) hours as needed for muscle spasms. 04/26/04   Delora Fuel, MD  ?valsartan (DIOVAN) 320 MG tablet Take 0.5 tablets (160 mg total) by mouth daily. 12/16/20   [name], Reynold Bowen, MD  ?Vitamin D, Ergocalciferol, (DRISDOL) 1.25 MG (50000 UNIT) CAPS capsule TAKE 1 CAPSULE BY MOUTH ONE TIME PER WEEK 02/14/21   [name], Billey Co., MD  ?warfarin (COUMADIN) 5 MG tablet TAKE 5 MG ON SUNDAY, THEN TAKE HALF A TABLET AFTERWARDS 11/17/20   Kennyth Arnold, FNP  ?   ? ?Allergies    ?Lipitor [atorvastatin], Morphine, Meperidine, and Naproxen sodium   ? ?Review of Systems   ?Review of Systems ? ?Physical Exam ?Updated Vital Signs ?BP (!) 136/103 (BP Location: Right Arm)   Pulse 63   Temp 98.1 ?F (36.7 ?C) (Temporal)   Resp 14   Ht _0  (1.626 m)   Wt 62.1 kg   SpO2 98%   BMI 23.52 kg/m?  ?Physical Exam ?Vitals and nursing note reviewed. Exam conducted with a chaperone present.  ?Constitutional:   ?   General: She is not in acute distress. ?   Appearance: Normal appearance.  ?HENT:  ?   Head: Normocephalic and atraumatic.  ?Eyes:  ?   General: No scleral icterus. ?   Extraocular Movements: Extraocular movements intact.  ?   Pupils: Pupils are equal, round, and reactive to light.  ?Cardiovascular:  ?   Pulses: Normal pulses.  ?Musculoskeletal:     ?   General: Tenderness present.  ?   Comments: Positive tinnel sign.  No focal swelling, tenderness diffusely to the left wrist worse on the ulnar side.  Able to flex and extend this elicits significant pain.  Tolerates passive ROM.  ?Skin: ?   General: Skin is warm.  ?    Capillary Refill: Capillary refill takes less than 2 seconds.  ?   Coloration: Skin is not jaundiced.  ?Neurological:  ?   Mental Status: She is alert. Mental status is at baseline.  ?   Coordination: Coordination normal.  ? ? ?ED Results / Procedures / Treatments   ?Labs ?(all labs ordered are listed, but only abnormal results are displayed) ?Labs Reviewed - No data to display ? ?EKG ?None ? ?Radiology ?DG Hand Complete Left ? ?Result Date: 07/17/2021 ?CLINICAL DATA:  Pain EXAM: LEFT HAND - COMPLETE 3+ VIEW COMPARISON:  None. FINDINGS: No recent fracture  or dislocation is seen. Severe degenerative changes are noted in first carpometacarpal joint. There is smooth marginated calcification in the palmar aspect of first carpometacarpal joint, possibly residual from previous injury. Degenerative changes are noted in the radiocarpal joint with bony spurs and chondrocalcinosis. Sclerosis is seen in the pisiform. Bony spurs seen in the interphalangeal joints, more severe in the interphalangeal joint of left thumb and distal interphalangeal joint of middle finger. There are smooth marginated calcifications adjacent to the interphalangeal joint of thumb, possibly related to severe degenerative arthritis or old injury. Small bony spurs are seen in first metacarpophalangeal joint. IMPRESSION: No recent fracture or dislocation is seen in the left hand. Severe degenerative changes are noted in first carpometacarpal joint. Degenerative changes are also noted in multiple other joints as described in the body of the report. Electronically Signed   By: Elmer Picker M.D.   On: 07/17/2021 17:09   ? ?Procedures ?Procedures  ? ? ?Medications Ordered in ED ?Medications  ?ondansetron (ZOFRAN-ODT) disintegrating tablet 8 mg (8 mg Oral Given 07/17/21 1806)  ?oxyCODONE-acetaminophen (PERCOCET/ROXICET) 5-325 MG per tablet 1 tablet (1 tablet Oral Given 07/17/21 1806)  ? ? ?ED Course/ Medical Decision Making/ A&P ?  ?                         ?Medical Decision Making ?Amount and/or Complexity of Data Reviewed ?Radiology: ordered. ? ?Risk ?Prescription drug management. ? ? ?Patient presents with left hand pain.  Differential diagnosis includes not li

## 2021-07-17 NOTE — Discharge Instructions (Signed)
Continue taking Tylenol at home.  Call your rheumatologist tomorrow to set up an appointment for reevaluation this week.  Continue wearing the brace for assistance. ?

## 2021-07-19 DIAGNOSIS — M25532 Pain in left wrist: Secondary | ICD-10-CM | POA: Diagnosis not present

## 2021-07-21 ENCOUNTER — Ambulatory Visit: Payer: Medicare PPO | Admitting: Family Medicine

## 2021-07-24 DIAGNOSIS — I10 Essential (primary) hypertension: Secondary | ICD-10-CM | POA: Diagnosis not present

## 2021-07-25 ENCOUNTER — Other Ambulatory Visit: Payer: Self-pay | Admitting: Psychiatry

## 2021-07-25 DIAGNOSIS — G4721 Circadian rhythm sleep disorder, delayed sleep phase type: Secondary | ICD-10-CM

## 2021-07-25 DIAGNOSIS — F5105 Insomnia due to other mental disorder: Secondary | ICD-10-CM

## 2021-07-27 ENCOUNTER — Ambulatory Visit: Payer: Medicare PPO | Admitting: Cardiology

## 2021-07-27 DIAGNOSIS — M25559 Pain in unspecified hip: Secondary | ICD-10-CM | POA: Diagnosis not present

## 2021-07-27 DIAGNOSIS — M1990 Unspecified osteoarthritis, unspecified site: Secondary | ICD-10-CM | POA: Diagnosis not present

## 2021-07-27 DIAGNOSIS — M109 Gout, unspecified: Secondary | ICD-10-CM | POA: Diagnosis not present

## 2021-07-27 DIAGNOSIS — Z791 Long term (current) use of non-steroidal anti-inflammatories (NSAID): Secondary | ICD-10-CM | POA: Diagnosis not present

## 2021-07-27 DIAGNOSIS — H04123 Dry eye syndrome of bilateral lacrimal glands: Secondary | ICD-10-CM | POA: Diagnosis not present

## 2021-07-27 DIAGNOSIS — M810 Age-related osteoporosis without current pathological fracture: Secondary | ICD-10-CM | POA: Diagnosis not present

## 2021-07-27 DIAGNOSIS — F329 Major depressive disorder, single episode, unspecified: Secondary | ICD-10-CM | POA: Diagnosis not present

## 2021-07-27 DIAGNOSIS — M797 Fibromyalgia: Secondary | ICD-10-CM | POA: Diagnosis not present

## 2021-07-27 DIAGNOSIS — M549 Dorsalgia, unspecified: Secondary | ICD-10-CM | POA: Diagnosis not present

## 2021-07-27 DIAGNOSIS — R682 Dry mouth, unspecified: Secondary | ICD-10-CM | POA: Diagnosis not present

## 2021-07-27 DIAGNOSIS — M7062 Trochanteric bursitis, left hip: Secondary | ICD-10-CM | POA: Diagnosis not present

## 2021-08-01 ENCOUNTER — Ambulatory Visit (INDEPENDENT_AMBULATORY_CARE_PROVIDER_SITE_OTHER): Payer: Medicare PPO

## 2021-08-01 ENCOUNTER — Ambulatory Visit: Payer: Medicare PPO | Admitting: Family Medicine

## 2021-08-01 ENCOUNTER — Encounter: Payer: Self-pay | Admitting: Family Medicine

## 2021-08-01 VITALS — BP 112/52 | HR 59 | Temp 97.8°F | Resp 16 | Ht 64.0 in | Wt 139.8 lb

## 2021-08-01 DIAGNOSIS — E039 Hypothyroidism, unspecified: Secondary | ICD-10-CM

## 2021-08-01 DIAGNOSIS — I10 Essential (primary) hypertension: Secondary | ICD-10-CM | POA: Diagnosis not present

## 2021-08-01 DIAGNOSIS — E119 Type 2 diabetes mellitus without complications: Secondary | ICD-10-CM | POA: Diagnosis not present

## 2021-08-01 DIAGNOSIS — D72829 Elevated white blood cell count, unspecified: Secondary | ICD-10-CM | POA: Diagnosis not present

## 2021-08-01 DIAGNOSIS — E782 Mixed hyperlipidemia: Secondary | ICD-10-CM | POA: Diagnosis not present

## 2021-08-01 DIAGNOSIS — Z7901 Long term (current) use of anticoagulants: Secondary | ICD-10-CM

## 2021-08-01 LAB — POCT INR: INR: 2.5 (ref 2.0–3.0)

## 2021-08-01 LAB — CBC WITH DIFFERENTIAL/PLATELET
Basophils Absolute: 0 10*3/uL (ref 0.0–0.1)
Basophils Relative: 0.4 % (ref 0.0–3.0)
Eosinophils Absolute: 0 10*3/uL (ref 0.0–0.7)
Eosinophils Relative: 0.2 % (ref 0.0–5.0)
HCT: 32.8 % — ABNORMAL LOW (ref 36.0–46.0)
Hemoglobin: 11.3 g/dL — ABNORMAL LOW (ref 12.0–15.0)
Lymphocytes Relative: 11.5 % — ABNORMAL LOW (ref 12.0–46.0)
Lymphs Abs: 1.3 10*3/uL (ref 0.7–4.0)
MCHC: 34.5 g/dL (ref 30.0–36.0)
MCV: 93.1 fl (ref 78.0–100.0)
Monocytes Absolute: 1.1 10*3/uL — ABNORMAL HIGH (ref 0.1–1.0)
Monocytes Relative: 9.8 % (ref 3.0–12.0)
Neutro Abs: 8.6 10*3/uL — ABNORMAL HIGH (ref 1.4–7.7)
Neutrophils Relative %: 78.1 % — ABNORMAL HIGH (ref 43.0–77.0)
Platelets: 225 10*3/uL (ref 150.0–400.0)
RBC: 3.53 Mil/uL — ABNORMAL LOW (ref 3.87–5.11)
RDW: 13.1 % (ref 11.5–15.5)
WBC: 11 10*3/uL — ABNORMAL HIGH (ref 4.0–10.5)

## 2021-08-01 LAB — HEMOGLOBIN A1C: Hgb A1c MFr Bld: 5.6 % (ref 4.6–6.5)

## 2021-08-01 LAB — TSH: TSH: 1.59 u[IU]/mL (ref 0.35–5.50)

## 2021-08-01 NOTE — Progress Notes (Signed)
? ?  Subjective:  ? ? Patient ID: Andrea Simmons, female    DOB: 1941-02-24, 81 y.o.   MRN: [id number] ? ?HPI ?HTN- chronic problem, on Valsartan '160mg'$  daily, Labetalol '200mg'$  BID, hydralazine '50mg'$  TID, Amlodipine '10mg'$  daily, and Lasix '20mg'$  PRN.  BP is excellently controlled today.  No CP, SOB, HAs, visual changes, edema. ? ?Hyperlipidemia- chronic problem, on Crestor '20mg'$  daily, Fenofibrate '48mg'$  daily.  No abd pain, N/V. ? ?DM- following w/ Dr Chalmers Cater.  Had her medication adjusted.  No longer on Metformin.  Was started on new medication but pt can't recall the name.  Recently started seeing Nephrology. ? ?Hypothyroid- chronic problem, on Levothyroxine 51mg daily.  'i've got no energy' ? ? ?Review of Systems ?For ROS see HPI  ?   ?Objective:  ? Physical Exam ?Vitals reviewed.  ?Constitutional:   ?   General: She is not in acute distress. ?   Appearance: Normal appearance. She is well-developed. She is not ill-appearing.  ?HENT:  ?   Head: Normocephalic and atraumatic.  ?Eyes:  ?   Conjunctiva/sclera: Conjunctivae normal.  ?   Pupils: Pupils are equal, round, and reactive to light.  ?Neck:  ?   Thyroid: No thyromegaly.  ?Cardiovascular:  ?   Rate and Rhythm: Normal rate and regular rhythm.  ?   Pulses: Normal pulses.  ?   Heart sounds: Normal heart sounds. No murmur heard. ?Pulmonary:  ?   Effort: Pulmonary effort is normal. No respiratory distress.  ?   Breath sounds: Normal breath sounds.  ?Abdominal:  ?   General: There is no distension.  ?   Palpations: Abdomen is soft.  ?   Tenderness: There is no abdominal tenderness.  ?Musculoskeletal:  ?   Cervical back: Normal range of motion and neck supple.  ?   Right lower leg: No edema.  ?   Left lower leg: No edema.  ?Lymphadenopathy:  ?   Cervical: No cervical adenopathy.  ?Skin: ?   General: Skin is warm and dry.  ?Neurological:  ?   Mental Status: She is alert and oriented to person, place, and time.  ?Psychiatric:     ?   Behavior: Behavior normal.  ? ? ? ? ? ?    ?Assessment & Plan:  ? ? ?

## 2021-08-01 NOTE — Assessment & Plan Note (Signed)
Chronic problem.  Currently on Levothyroxine 58mg daily.  Reports ongoing issue of fatigue.  Check labs.  Adjust meds prn  ?

## 2021-08-01 NOTE — Assessment & Plan Note (Signed)
Chronic problem.  Excellent control- maybe over controlled.  Asymptomatic w/ exception of fatigue.  Pt also sees Cardiology so will not make any med changes at this time as he is the prescriber.  Will check labs due to ARB and Lasix but no anticipated med changes. ?

## 2021-08-01 NOTE — Patient Instructions (Addendum)
Pre visit review using our clinic review tool, if applicable. No additional management support is needed unless otherwise documented below in the visit note. ? ?Continue 1/2 tablet daily except take 1 tablet on Wednesdays and Saturdays. Re-check in 6 weeks. ?

## 2021-08-01 NOTE — Assessment & Plan Note (Signed)
Chronic problem.  Following w/ Dr Chalmers Cater.  Doesn't remember her most recent A1C so will repeat.  She is no longer on Metformin but doesn't recall the name of her new diabetes med.  She is to message me w/ the name and dose.  Will continue to follow along. ?

## 2021-08-01 NOTE — Assessment & Plan Note (Signed)
Chronic problem.  Tolerating Crestor '20mg'$  daily and Fenofibrate '48mg'$  daily w/o difficulty.  Check labs.  Adjust meds prn  ?

## 2021-08-01 NOTE — Progress Notes (Signed)
Continue 1/2 tablet daily except take 1 tablet on Wednesdays and Saturdays. Re-check in 6 weeks. ?

## 2021-08-01 NOTE — Patient Instructions (Signed)
Schedule your complete physical in 6 months ?We'll notify you of your lab results and make any changes if needed ?Please message me and let me know what medicine you are taking for your sugar (diabetes) ?Call with any questions or concerns ?Hang in there!! ?

## 2021-08-02 ENCOUNTER — Other Ambulatory Visit: Payer: Self-pay

## 2021-08-02 ENCOUNTER — Telehealth: Payer: Self-pay

## 2021-08-02 LAB — HEPATIC FUNCTION PANEL
ALT: 19 U/L (ref 0–35)
AST: 19 U/L (ref 0–37)
Albumin: 4.7 g/dL (ref 3.5–5.2)
Alkaline Phosphatase: 35 U/L — ABNORMAL LOW (ref 39–117)
Bilirubin, Direct: 0.1 mg/dL (ref 0.0–0.3)
Total Bilirubin: 0.5 mg/dL (ref 0.2–1.2)
Total Protein: 6.9 g/dL (ref 6.0–8.3)

## 2021-08-02 LAB — LIPID PANEL
Cholesterol: 178 mg/dL (ref 0–200)
HDL: 73 mg/dL (ref >39.00)
NonHDL: 104.6
Total CHOL/HDL Ratio: 2
Triglycerides: 212 mg/dL — ABNORMAL HIGH (ref 0.0–149.0)
VLDL: 42.4 mg/dL — ABNORMAL HIGH (ref 0.0–40.0)

## 2021-08-02 LAB — BASIC METABOLIC PANEL
BUN: 39 mg/dL — ABNORMAL HIGH (ref 6–23)
CO2: 24 mEq/L (ref 19–32)
Calcium: 9.7 mg/dL (ref 8.4–10.5)
Chloride: 102 mEq/L (ref 96–112)
Creatinine, Ser: 1.92 mg/dL — ABNORMAL HIGH (ref 0.40–1.20)
GFR: 24.53 mL/min — ABNORMAL LOW (ref >60.00)
Glucose, Bld: 101 mg/dL — ABNORMAL HIGH (ref 70–99)
Potassium: 4.5 mEq/L (ref 3.5–5.1)
Sodium: 137 mEq/L (ref 135–145)

## 2021-08-02 LAB — LDL CHOLESTEROL, DIRECT: Direct LDL: 83 mg/dL

## 2021-08-02 NOTE — Telephone Encounter (Signed)
Spoke w/ pt and informed of labs . Lab visit is scheduled May 30,2023 @ 115 pm for repeat CBC. Order is in place  ?

## 2021-08-02 NOTE — Addendum Note (Signed)
Addended by: [name] on: 08/02/2021 07:17 AM ? ? Modules accepted: Orders ? ?

## 2021-08-02 NOTE — Telephone Encounter (Signed)
-----   Message from [name], MD sent at 08/02/2021  7:17 AM EDT ----- ?Your Creatinine has increased to 1.92  This indicates worsening kidney function and may be contributing to your fatigue.  Please make sure you are drinking water throughout the day.  I will send these results to Dr Carolin Sicks for review. ? ?Your white blood cell count is mildly elevated.  We can see this during illness or infection.  We will repeat your CBC (ordered) at a lab only visit in 1-2 weeks to make sure this is trending down ? ?Remainder of labs are stable ?

## 2021-08-09 ENCOUNTER — Ambulatory Visit: Payer: Medicare PPO | Admitting: Cardiology

## 2021-08-09 ENCOUNTER — Encounter: Payer: Self-pay | Admitting: Cardiology

## 2021-08-09 VITALS — BP 116/57 | HR 56 | Temp 98.0°F | Resp 16 | Ht 64.0 in | Wt 137.0 lb

## 2021-08-09 DIAGNOSIS — I129 Hypertensive chronic kidney disease with stage 1 through stage 4 chronic kidney disease, or unspecified chronic kidney disease: Secondary | ICD-10-CM | POA: Diagnosis not present

## 2021-08-09 DIAGNOSIS — I10 Essential (primary) hypertension: Secondary | ICD-10-CM

## 2021-08-09 DIAGNOSIS — N184 Chronic kidney disease, stage 4 (severe): Secondary | ICD-10-CM | POA: Diagnosis not present

## 2021-08-09 NOTE — Progress Notes (Signed)
Follow up visit  Subjective:   Andrea Simmons, female    DOB: 1940/04/05, 81 y.o.   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 Denies any specific chest pain or shortness of breath. She has had difficulty sleeping. She is following up with nephrology regularly.    Current Outpatient Medications:    ACCU-CHEK GUIDE test strip, CHECK BLOOD SUGARS DAILY, Disp: 100 strip, Rfl: 12   Accu-Chek Softclix Lancets lancets, USE AS INSTRUCTED TO TEST SUGARS TWICE A DAY, Disp: 100 each, Rfl: 12   amLODipine (NORVASC) 10 MG tablet, TAKE 1 TABLET BY MOUTH EVERY DAY, Disp: 90 tablet, Rfl: 2   Biotin 1000 MCG tablet, Take 1,000 mcg by mouth daily., Disp: , Rfl:    Blood Glucose Monitoring Suppl (ACCU-CHEK GUIDE) w/Device KIT, 1 each by Does not apply route 2 (two) times daily. To test sugars. Dx. E11.9, Disp: 1 kit, Rfl: 1   Colchicine (MITIGARE) 0.6 MG CAPS, Take 1 capsule by mouth 2 (two) times daily as needed., Disp: 60 capsule, Rfl: 3   DULoxetine (CYMBALTA) 30 MG capsule, Take 3 capsules (90 mg total) by mouth daily., Disp: 270 capsule, Rfl: 1   enoxaparin (LOVENOX) 60 MG/0.6ML injection, Inject 0.6 mLs (60 mg total) into the skin daily., Disp: 12 mL, Rfl: 0   eszopiclone (LUNESTA) 2 MG TABS tablet, TAKE 1 TABLET (2 MG TOTAL) BY MOUTH IMMEDIATELY BEFORE BEDTIME AS NEEDED FOR SLEEP, Disp: 30 tablet, Rfl: 1   fenofibrate (TRICOR) 48 MG tablet, Take 48 mg by mouth daily., Disp: , Rfl:    ferrous sulfate 325 (65 FE) MG tablet, Take 1 tablet (325 mg total) by mouth daily., Disp: 90 tablet, Rfl: 1   FLAREX 0.1 % ophthalmic suspension, Apply to eye., Disp: , Rfl:    furosemide (LASIX) 20 MG tablet, TAKE 1 TABLET BY  MOUTH AS NEEDED (Patient taking differently: Take 20 mg by mouth daily as needed for edema.), Disp: 90 tablet, Rfl: 1   hydrALAZINE (APRESOLINE) 50 MG tablet, TAKE 1 TABLET BY MOUTH THREE TIMES A DAY, Disp: 90 tablet, Rfl: 3   labetalol (NORMODYNE) 200 MG tablet, Take 1 tablet (200 mg total) by mouth 2 (two) times daily., Disp: 180 tablet, Rfl: 2   levothyroxine (SYNTHROID) 25 MCG tablet, TAKE 1 TABLET BY MOUTH EVERY DAY BEFORE BREAKFAST, Disp: 90 tablet, Rfl: 1   linagliptin (TRADJENTA) 5 MG TABS tablet, Take 5 mg by mouth daily., Disp: , Rfl:    Magnesium 500 MG TABS, Take 500 mg by mouth daily., Disp: , Rfl:    RESTASIS MULTIDOSE 0.05 % ophthalmic emulsion, Place 1 drop into both eyes 2 (two) times daily. , Disp: , Rfl: 6   rosuvastatin (CRESTOR) 20 MG tablet, Take 20 mg by mouth daily., Disp: , Rfl:    tiZANidine (ZANAFLEX) 4 MG tablet, Take 1 tablet (4 mg total) by mouth every 6 (six) hours as needed for muscle spasms., Disp: 40 tablet, Rfl: 0   valsartan (DIOVAN) 320 MG tablet, Take 0.5 tablets (160 mg total) by mouth daily., Disp: 1 tablet, Rfl: 0   Vitamin D, Ergocalciferol, (DRISDOL) 1.25 MG (50000 UNIT) CAPS capsule, TAKE 1 CAPSULE BY MOUTH ONE TIME PER WEEK, Disp: 12 capsule,  Rfl: 1   warfarin (COUMADIN) 5 MG tablet, TAKE 5 MG ON SUNDAY, THEN TAKE HALF A TABLET AFTERWARDS, Disp: 70 tablet, Rfl: 2   nitroGLYCERIN (NITROSTAT) 0.4 MG SL tablet, Place 1 tablet (0.4 mg total) under the tongue every 5 (five) minutes as needed for chest pain., Disp: 30 tablet, Rfl: 3   pantoprazole (PROTONIX) 40 MG tablet, Take 1 tablet (40 mg total) by mouth 2 (two) times daily before a meal., Disp: 60 tablet, Rfl: 2  Cardiovascular studies:  EKG 08/09/2021: Sinus bradycardia 49 bpm  Right bundle branch block with left anterior fascicular block  Echocardiogram 01/11/2021:  Left ventricle cavity is normal in size. Moderate concentric hypertrophy  of the left ventricle. Normal global wall motion. Normal LV  systolic  function with visual EF 55-60%. Doppler evidence of grade I (impaired)  diastolic dysfunction, normal LAP.  Trace MR, trace TR.  Normal right atrial pressure.  Previous study in 2019 reported mild LA dilatation, mild MR, mild AI.  Lexiscan myoview stress test 01/30/2018: 1. The resting electrocardiogram demonstrated normal sinus rhythm, LAD, LAFB, RBBB and no resting arrhythmias.  The stress electrocardiogram was non-diagnostic due to pharmacologic stress. Stress symptoms included dyspnea. Resting BP 166/86 and peak BP 202/88 mm Hg.  2. Myocardial perfusion imaging is normal. Overall left ventricular systolic function was normal without regional wall motion abnormalities. The left ventricular ejection fraction was 56%.  This is a low risk study.  Echocardiogram 01/28/2018:  Left ventricle cavity is normal in size. Moderate concentric hypertrophy of the left ventricle. Normal global wall motion. Doppler evidence of grade I (impaired) diastolic dysfunction, normal LAP. Calculated EF 55%. Left atrial cavity is mildly dilated. Mild (Grade I) aortic regurgitation. Mild (Grade I) mitral regrgitation. Trace tricuspid regurgitation. Inadequate tricuspid regurgitation jet to estimate pulmonary artery pressure. Normal right atrial pressure.  Recent labs: 08/01/2021: Glucose 101, BUN/Cr 39/1.92. EGFR 24. Na/K 137/4.5. AlKP 35. Rest of the CMP normal H/H 11/32.8. MCV 93. Platelets 225 HbA1C 5.6% Chol 178, TG 212, HDL 73, LDL 83 TSH 1.5 normal   Review of Systems  Constitutional: Positive for malaise/fatigue.       Difficulty sleeping   Cardiovascular:  Negative for chest pain, dyspnea on exertion, leg swelling, palpitations and syncope.  Musculoskeletal:  Positive for back pain.       Vitals:   08/09/21 1326  BP: (!) 116/57  Pulse: (!) 56  Resp: 16  Temp: 98 F (36.7 C)  SpO2: 96%       Objective:   Physical Exam Vitals and nursing note reviewed.  Constitutional:       General: She is not in acute distress. Neck:     Vascular: No JVD.  Cardiovascular:     Rate and Rhythm: Normal rate and regular rhythm.     Pulses: Intact distal pulses.     Heart sounds: Normal heart sounds. No murmur heard.    Comments: Bilateral LE varcocities Pulmonary:     Effort: Pulmonary effort is normal.     Breath sounds: Normal breath sounds. No wheezing or rales.        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 No changes made  Fatigue: Likely due to sleep issues as well as CKD. Continue f/u w/nephrology.  F/u in 6 months.   Nigel Mormon, MD [facility] Pager: [phone] Office:  [phone] If no answer Cell [phone]

## 2021-08-12 ENCOUNTER — Other Ambulatory Visit: Payer: Self-pay | Admitting: Cardiology

## 2021-08-12 DIAGNOSIS — N1832 Chronic kidney disease, stage 3b: Secondary | ICD-10-CM | POA: Diagnosis not present

## 2021-08-12 DIAGNOSIS — I10 Essential (primary) hypertension: Secondary | ICD-10-CM

## 2021-08-15 ENCOUNTER — Other Ambulatory Visit: Payer: Self-pay | Admitting: Psychiatry

## 2021-08-16 ENCOUNTER — Other Ambulatory Visit (INDEPENDENT_AMBULATORY_CARE_PROVIDER_SITE_OTHER): Payer: Medicare PPO

## 2021-08-16 DIAGNOSIS — D72829 Elevated white blood cell count, unspecified: Secondary | ICD-10-CM | POA: Diagnosis not present

## 2021-08-16 LAB — CBC WITH DIFFERENTIAL/PLATELET
Basophils Absolute: 0 10*3/uL (ref 0.0–0.1)
Basophils Relative: 0.6 % (ref 0.0–3.0)
Eosinophils Absolute: 0.1 10*3/uL (ref 0.0–0.7)
Eosinophils Relative: 1.8 % (ref 0.0–5.0)
HCT: 29.6 % — ABNORMAL LOW (ref 36.0–46.0)
Hemoglobin: 10.3 g/dL — ABNORMAL LOW (ref 12.0–15.0)
Lymphocytes Relative: 20.3 % (ref 12.0–46.0)
Lymphs Abs: 1.1 10*3/uL (ref 0.7–4.0)
MCHC: 34.9 g/dL (ref 30.0–36.0)
MCV: 93.6 fl (ref 78.0–100.0)
Monocytes Absolute: 0.6 10*3/uL (ref 0.1–1.0)
Monocytes Relative: 10.7 % (ref 3.0–12.0)
Neutro Abs: 3.7 10*3/uL (ref 1.4–7.7)
Neutrophils Relative %: 66.6 % (ref 43.0–77.0)
Platelets: 147 10*3/uL — ABNORMAL LOW (ref 150.0–400.0)
RBC: 3.16 Mil/uL — ABNORMAL LOW (ref 3.87–5.11)
RDW: 13.1 % (ref 11.5–15.5)
WBC: 5.6 10*3/uL (ref 4.0–10.5)

## 2021-08-17 ENCOUNTER — Telehealth: Payer: Self-pay

## 2021-08-17 NOTE — Telephone Encounter (Signed)
I would encourage her to take her iron supplement, a daily multivitamin, and rest when needed

## 2021-08-17 NOTE — Telephone Encounter (Signed)
Called pt back, advised of Dr Rande Lawman recommendations she was okay with this will call back next week

## 2021-08-17 NOTE — Telephone Encounter (Signed)
-----   Message from [name], MD sent at 08/17/2021  7:28 AM EDT ----- White blood cell count is back in normal range.  Hgb is also in normal range for you b/c you run between 9.8 --> 11.3  Currently at 10.3.  Please make sure you are taking your Ferrous Sulfate (iron) daily as directed

## 2021-08-17 NOTE — Telephone Encounter (Signed)
Patient is aware of lab results. Andrea Simmons states that Andrea Simmons is still very tired and it has become worse recently. Andrea Simmons is currently waiting on blood work from the cardiologist. Andrea Simmons wanted to see if there was anything else Dr Birdie Riddle would advise regarding how low her energy has been

## 2021-08-24 DIAGNOSIS — I10 Essential (primary) hypertension: Secondary | ICD-10-CM | POA: Diagnosis not present

## 2021-09-02 DIAGNOSIS — N390 Urinary tract infection, site not specified: Secondary | ICD-10-CM | POA: Diagnosis not present

## 2021-09-02 DIAGNOSIS — E1122 Type 2 diabetes mellitus with diabetic chronic kidney disease: Secondary | ICD-10-CM | POA: Diagnosis not present

## 2021-09-02 DIAGNOSIS — I129 Hypertensive chronic kidney disease with stage 1 through stage 4 chronic kidney disease, or unspecified chronic kidney disease: Secondary | ICD-10-CM | POA: Diagnosis not present

## 2021-09-02 DIAGNOSIS — N179 Acute kidney failure, unspecified: Secondary | ICD-10-CM | POA: Diagnosis not present

## 2021-09-02 DIAGNOSIS — N1832 Chronic kidney disease, stage 3b: Secondary | ICD-10-CM | POA: Diagnosis not present

## 2021-09-07 DIAGNOSIS — M797 Fibromyalgia: Secondary | ICD-10-CM | POA: Diagnosis not present

## 2021-09-07 DIAGNOSIS — M109 Gout, unspecified: Secondary | ICD-10-CM | POA: Diagnosis not present

## 2021-09-07 DIAGNOSIS — R682 Dry mouth, unspecified: Secondary | ICD-10-CM | POA: Diagnosis not present

## 2021-09-07 DIAGNOSIS — F329 Major depressive disorder, single episode, unspecified: Secondary | ICD-10-CM | POA: Diagnosis not present

## 2021-09-07 DIAGNOSIS — M7062 Trochanteric bursitis, left hip: Secondary | ICD-10-CM | POA: Diagnosis not present

## 2021-09-07 DIAGNOSIS — M1990 Unspecified osteoarthritis, unspecified site: Secondary | ICD-10-CM | POA: Diagnosis not present

## 2021-09-07 DIAGNOSIS — M810 Age-related osteoporosis without current pathological fracture: Secondary | ICD-10-CM | POA: Diagnosis not present

## 2021-09-07 DIAGNOSIS — H04123 Dry eye syndrome of bilateral lacrimal glands: Secondary | ICD-10-CM | POA: Diagnosis not present

## 2021-09-07 DIAGNOSIS — M549 Dorsalgia, unspecified: Secondary | ICD-10-CM | POA: Diagnosis not present

## 2021-09-12 ENCOUNTER — Ambulatory Visit (INDEPENDENT_AMBULATORY_CARE_PROVIDER_SITE_OTHER): Payer: Medicare PPO

## 2021-09-12 ENCOUNTER — Ambulatory Visit: Payer: Medicare PPO

## 2021-09-12 ENCOUNTER — Other Ambulatory Visit: Payer: Self-pay | Admitting: Family Medicine

## 2021-09-12 DIAGNOSIS — L738 Other specified follicular disorders: Secondary | ICD-10-CM | POA: Diagnosis not present

## 2021-09-12 DIAGNOSIS — L570 Actinic keratosis: Secondary | ICD-10-CM | POA: Diagnosis not present

## 2021-09-12 DIAGNOSIS — D692 Other nonthrombocytopenic purpura: Secondary | ICD-10-CM | POA: Diagnosis not present

## 2021-09-12 DIAGNOSIS — Z85828 Personal history of other malignant neoplasm of skin: Secondary | ICD-10-CM | POA: Diagnosis not present

## 2021-09-12 DIAGNOSIS — L814 Other melanin hyperpigmentation: Secondary | ICD-10-CM | POA: Diagnosis not present

## 2021-09-12 DIAGNOSIS — B078 Other viral warts: Secondary | ICD-10-CM | POA: Diagnosis not present

## 2021-09-12 DIAGNOSIS — D485 Neoplasm of uncertain behavior of skin: Secondary | ICD-10-CM | POA: Diagnosis not present

## 2021-09-12 DIAGNOSIS — Z7901 Long term (current) use of anticoagulants: Secondary | ICD-10-CM | POA: Diagnosis not present

## 2021-09-12 DIAGNOSIS — D0472 Carcinoma in situ of skin of left lower limb, including hip: Secondary | ICD-10-CM | POA: Diagnosis not present

## 2021-09-12 LAB — POCT INR: INR: 1.9 — AB (ref 2.0–3.0)

## 2021-09-14 ENCOUNTER — Encounter: Payer: Self-pay | Admitting: Psychiatry

## 2021-09-14 ENCOUNTER — Ambulatory Visit: Payer: Medicare PPO | Admitting: Psychiatry

## 2021-09-14 DIAGNOSIS — F331 Major depressive disorder, recurrent, moderate: Secondary | ICD-10-CM

## 2021-09-14 DIAGNOSIS — F5105 Insomnia due to other mental disorder: Secondary | ICD-10-CM

## 2021-09-14 DIAGNOSIS — F411 Generalized anxiety disorder: Secondary | ICD-10-CM

## 2021-09-14 DIAGNOSIS — F338 Other recurrent depressive disorders: Secondary | ICD-10-CM

## 2021-09-14 DIAGNOSIS — F4001 Agoraphobia with panic disorder: Secondary | ICD-10-CM | POA: Diagnosis not present

## 2021-09-14 DIAGNOSIS — G4721 Circadian rhythm sleep disorder, delayed sleep phase type: Secondary | ICD-10-CM | POA: Diagnosis not present

## 2021-09-14 MED ORDER — DULOXETINE HCL 30 MG PO CPEP: 90.0000 mg | ORAL_CAPSULE | Freq: Every day | ORAL | 1 refills | Status: DC

## 2021-09-14 NOTE — Progress Notes (Signed)
Andrea Simmons [id number] 07/05/1940 81 y.o.  Subjective:   Patient ID:  Andrea Simmons is a 81 y.o. (DOB 05/03/1940) female.  Chief Complaint:  Chief Complaint  Patient presents with   Follow-up   Depression   Anxiety   Sleeping Problem    Depression        Associated symptoms include fatigue and myalgias.  Associated symptoms include no decreased concentration, no appetite change and no suicidal ideas.   Andrea Simmons presents  today for recent exacerbation of depression..    At visit was July 09, 2018.  Vraylar was discontinued for lack of response.  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  This was partially helpful.  When  seen Aug 15, 2018 and Ritalin was increased to 15 mg twice daily because of partial response at the lower dose.  At visit and of July 2020.  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  The Ritalin was increased again to 20 mg twice daily.  seen December 04, 2018 & 02/2019.  Doesn't like winter.  Seasonal depression and crying more.  There were no med changes.   11/17/19 appt with the following noted: BP been higher and seeing Card and having med changes. Stopped Vit D bc level was high. Some discussion about the Ritalin over the BP control. Some increase depression in part over health issues.  No marital problems.  No SI.  Issues with daughter are a stressor.  D said she was mean when D was growing up and was too strict.  Felt D was ungrateful and that she and H did the best they could do.  I can't get over that and has told D about this. Ritalin with less energizing benefit and less productive than she was..  Wears off around supper time.  At night cannot break habit of snacks at night. Does not feel it kick in and wear off.  Back to old habits of staying up too late, now MN.  Needs 7-8 hours.  Always needed more  than others.    I feel good taking it.  Most days feels pretty good.  Can have crying spells without reason even before the Ritalin.   Doing much more than I was.   H still sick often too. Plan: Reduce Ritalin to 10 mg twice daily due to loss of benefit and blood pressure Start change from duloxetine to Trintellix to help depression: Reduce duloxetine to 3 of 30 mg capsules and start Trintellix 5 mg daily for 1 week, Then reduce duloxetine to 2 of the 30 mg capsules and increase Trintellix to 10 mg daily for 1 week, Then increase Trintellix to 10 + 5 mg tablets and reduce duloxetine to 1 of the 30 mg capsules for 1 week, Then stop Duloxetine and increase Trintellix to 20 mg daily (or 2 of the 10 mg tablets)  01/14/2020 appointment with the following noted: Is on Trintellix 20 mg daily since 12/08/19 approximately. Was really hard with the switch.  Got really low and now some better.  Had a lot of aches and fatigue for awhile for 5 weeks.  Wonders if she was sick.  She doesn't feel like Trintellix is the right med. Gained 14# and still on low dose Ritalin.  Still having crying spells and poor energy but some days feels pretty good. No  nausea or other SE noticed. A lot of worry over her health and BP and H's cellulitis again. Plan: Overall patient has not improved with switch from duloxetine to Trintellix.  Her energy is not better and her mood is not better.  She is also quite anxious. Reduce Trintellix to 10 mg 1 daily and start viibryd 10 mg daily for a week, then  Stop Trintellix and increase Viibryd to 20 mg daily with food.  [place] 01/23/20 wanting to stop Ritalin so she did.  01/30/20 TC : LM:  Suggested she restart vitamin D 2000 units daily.  Her vitamin D level is a little low and we would like to see it in the 50s.  It is hoped that dose will push back into the 50s and if there is a question we will recheck it in 3 months. She has not been on Viibryd 20 mg long enough to see the full benefit  of that.  She needs to continue Viibryd 20 mg for at least a full 4 weeks at that dosage so another 3 weeks or so.  At that point we could consider increasing it if needed.   03/10/20 appt with following noted: Ritalin didn't help and might have increased BP per card. On Viibryd about 6 weeks at 20 mg. Read on viibryd  And concerned about taking with coumadin. Gotten where she can't sleep well.  Last week only averaging 3 hours per night.  Also irritable a lot in last 10 days. Gained 17#. Hungry. Worries about things more than in the past and some of it is age. [name] and Monday diarrhea.  Otherwise just at times. Plan: DC Ritalin per her request. Poor response so far with Viibryd. Increase Viibryd to 30 mg for 1 week, then 40 mg daily.  04/28/2020 appt with following noted: A lot better but far from where I need to be. Still depressed. Can't sleep.  Catnap.  Taking viibryd in morning 40 mg for 5 weeks. Increase Viibryd to 40 mg daily.   3-4 hours sleep and no naps.  Not drowsy daytime. Ongoing tiredness chronically. Feels more tense and irritable from not sleeping.  Plan: continue Viibryd 40  06/17/2020 appt with following noted: vIIBRYD not helping. Trazodone 25 mg hallucinated bugs and screamed. H not doing well and that hasn't helped mood. Body aches and hurts all over. Asks about return to Cymbalta. Still gaining weight. Broken sleep with total of 6-8 hours Plan: CO poor effect with Viibryd and duloxetine did help FM more and pain. Wean Viibryd and restart duloxetine and increase to 90 mg daily (*took 120 mg before) Reduce Viibryd to 20 mg daily and start 1 of the duloxetine 30 mg daily for 5 days, Then continue Viibryd 20 mg daily and increase duloxetine to 2 of the 30 mg daily for 5 days, Then reduce Viibryd to 10 mg daily and increase duloxetine to 3 of the 30 mg daily for 5 days, Then stop Viibryd and continue duloxetine 3 daily. Rec trial mirtazapine 7.5 mg HS.  08/12/2020  appointment with the following noted: I'm doing better with Cymbalta 90. "much, much better" Mirtazapine didn't work for [employer] but is sleeping now.  Laid down at MN and to sleep 2 A and up 845. Arthritis is bad and dx gout.  Joints in hands are bad.  Anxiety manageable. Depression got really bad before the switch in meds.  Pleased with result.  Seasonal depression starts in fall and until spring. Busy and worked with  flowers. Pretty good now. Plan no med changes.  Continue duloxetine 90 mg daily and mirtazapine for sleep  12/15/2020 appointment with the following noted: I'm doing really good.  Cymbalta is a success.  She's not worried about winter.   Ardyth Gal has not been well with cellulitis.  She's been through health problems.  Handles stressors well.   CO still poor sleep even with mirtazapine.  Will have 2-3 nights ok and other nights EFA.  Wants to sleep 11-8.  Seems she can't get relaxed and will worry at night.  Some nights pain interferes. Plan: Better with switch back to duloxetine 90 mg daily (*took 120 mg before) Okay trial of Lunesta 2 mg nightly with mirtazapine 7.5 mg nightly for chronic insomnia.  03/16/2021 appointment with the following noted: "Wonderful" cp to last year was depressed.  Pleased with duloxetine. May go 2-3 nights with poor sleep per week. Hangover if use Lunesta 2 with mirtazapine 7.5.  So stopped mirtazapine.  Johnnye Sima works sometime but no SE with it. D positive for Covid and didn't visit for Xmas. Plan: DC mirtazapine 7.5 mg HS DT poor response and hangover Belsomra samples given  09/14/21 appt noted: Belsomra NR and NM and none now. Can be startled when she awakens at times and did with Belsomra. Trouble getting to sleep then awakens with pain. Some nights almost no sleep Energy better with change in BP meds. Couple of falls since here. No depression despite health issues.  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   Past  Psychiatric Medication Trials: Rozerem, zolpidem,  Trazodone severe SE, mirtazapine 7.5, Lunesta 2  Belsomra NM  Duloxetine 120, Paxil 20 for years, Sertraline, fluoxetine, Wellbutrin,   Trintellix 20 5 weeks NR.  Viibryd 40  NR,  Low dose nortriptyline,  Abilify weight gain and loss benefit, Was More talkative on the Abilify and the Wellbutrin.  lithium 2004 SE tremor at 63m daily.   Vraylar 40 NR, Buspar NR.  Review of Systems:  Review of Systems  Constitutional:  Positive for fatigue. Negative for appetite change and unexpected weight change.  HENT:  Negative for sinus pain.   Cardiovascular:  Negative for palpitations.  Gastrointestinal:  Negative for diarrhea.  Musculoskeletal:  Positive for arthralgias, back pain, joint swelling and myalgias. Negative for neck stiffness.  Neurological:  Negative for tremors, weakness and numbness.  Psychiatric/Behavioral:  Positive for dysphoric mood. Negative for agitation, behavioral problems, confusion, decreased concentration, hallucinations, self-injury, sleep disturbance and suicidal ideas. The patient is nervous/anxious. The patient is not hyperactive.    Hx anemia.    Cardiologist said she had a stiff heart.  History of pulmonary embolism and on Coumadin.  Medications: I have reviewed the patient's current medications.  Current Outpatient Medications  Medication Sig Dispense Refill   Vitamin D, Ergocalciferol, (DRISDOL) 1.25 MG (50000 UNIT) CAPS capsule TAKE 1 CAPSULE BY MOUTH ONE TIME PER WEEK 12 capsule 0   ACCU-CHEK GUIDE test strip CHECK BLOOD SUGARS DAILY 100 strip 12   Accu-Chek Softclix Lancets lancets USE AS INSTRUCTED TO TEST SUGARS TWICE A DAY 100 each 12   amLODipine (NORVASC) 10 MG tablet TAKE 1 TABLET BY MOUTH EVERY DAY 90 tablet 2   Biotin 1000 MCG tablet Take 1,000 mcg by mouth daily.     Blood Glucose Monitoring Suppl (ACCU-CHEK GUIDE) w/Device KIT 1 each by Does not apply route 2 (two) times daily. To test sugars.  Dx. E11.9 1 kit 1   Colchicine (MITIGARE)  0.6 MG CAPS Take 1 capsule by mouth 2 (two) times daily as needed. 60 capsule 3   DULoxetine (CYMBALTA) 30 MG capsule Take 3 capsules (90 mg total) by mouth daily. 270 capsule 1   enoxaparin (LOVENOX) 60 MG/0.6ML injection Inject 0.6 mLs (60 mg total) into the skin daily. 12 mL 0   eszopiclone (LUNESTA) 2 MG TABS tablet TAKE 1 TABLET (2 MG TOTAL) BY MOUTH IMMEDIATELY BEFORE BEDTIME AS NEEDED FOR SLEEP 30 tablet 1   fenofibrate (TRICOR) 48 MG tablet Take 48 mg by mouth daily.     ferrous sulfate 325 (65 FE) MG tablet TAKE 1 TABLET BY MOUTH EVERY DAY 90 tablet 1   FLAREX 0.1 % ophthalmic suspension Apply to eye.     furosemide (LASIX) 20 MG tablet TAKE 1 TABLET BY MOUTH AS NEEDED (Patient taking differently: Take 20 mg by mouth daily as needed for edema.) 90 tablet 1   hydrALAZINE (APRESOLINE) 50 MG tablet TAKE 1 TABLET BY MOUTH THREE TIMES A DAY 90 tablet 3   labetalol (NORMODYNE) 200 MG tablet Take 1 tablet (200 mg total) by mouth 2 (two) times daily. 180 tablet 2   levothyroxine (SYNTHROID) 25 MCG tablet TAKE 1 TABLET BY MOUTH EVERY DAY BEFORE BREAKFAST 90 tablet 1   linagliptin (TRADJENTA) 5 MG TABS tablet Take 5 mg by mouth daily.     Magnesium 500 MG TABS Take 500 mg by mouth daily.     nitroGLYCERIN (NITROSTAT) 0.4 MG SL tablet Place 1 tablet (0.4 mg total) under the tongue every 5 (five) minutes as needed for chest pain. 30 tablet 3   pantoprazole (PROTONIX) 40 MG tablet Take 1 tablet (40 mg total) by mouth 2 (two) times daily before a meal. 60 tablet 2   RESTASIS MULTIDOSE 0.05 % ophthalmic emulsion Place 1 drop into both eyes 2 (two) times daily.   6   rosuvastatin (CRESTOR) 20 MG tablet Take 20 mg by mouth daily.     tiZANidine (ZANAFLEX) 4 MG tablet Take 1 tablet (4 mg total) by mouth every 6 (six) hours as needed for muscle spasms. 40 tablet 0   valsartan (DIOVAN) 320 MG tablet TAKE 1 TABLET BY MOUTH EVERY DAY 90 tablet 2   warfarin (COUMADIN)  5 MG tablet TAKE 5 MG ON SUNDAY, THEN TAKE HALF A TABLET AFTERWARDS 70 tablet 2   No current facility-administered medications for this visit.    Medication Side Effects: None talkative more the [facility]  Allergies:  Allergies  Allergen Reactions   Lipitor [Atorvastatin]     Unknown   Morphine Nausea And Vomiting   Meperidine Nausea And Vomiting   Naproxen Sodium Nausea And Vomiting    Past Medical History:  Diagnosis Date   Anemia    Anxiety    Breast cancer ([place])    Depression    Diabetes mellitus type II    Fibromyalgia    GERD (gastroesophageal reflux disease)    Hyperlipidemia    Hypertension    Malignant neoplasm of breast (female), unspecified site 1993   L breast s/p mastectomy and tamoxifen x 2yr   Other pulmonary embolism and infarction 2008 and 2009   chronic anticoag - [facility]   Unspecified hypothyroidism     Family History  Problem Relation Age of Onset   Depression Other        Parent   Arthritis Other        Parent, Grandparent   Hypertension Other  Grandparent   Hyperlipidemia Other        [place]   Miscarriages / Stillbirths Other        Grandparent   Stroke Other        [place]   Cancer Maternal Uncle        prostate   Hypertension Maternal Grandfather    Breast cancer Cousin    Breast cancer Cousin     Social History   Socioeconomic History   Marital status: Married    Spouse name: Not on file   Number of children: 1   Years of education: Not on file   Highest education level: Not on file  Occupational History   Occupation: Armed forces technical officer: RETIRED   Occupation: Emergency planning/management officer   Occupation: school bus driver  Tobacco Use   Smoking status: Never   Smokeless tobacco: Never  Scientific laboratory technician Use: Never used  Substance and Sexual Activity   Alcohol use: No    Alcohol/week: 0.0 standard drinks of alcohol   Drug use: No   Sexual activity: Not Currently  Other Topics Concern   Not on file  Social History Narrative    Not on file   Social Determinants of Health   Financial Resource Strain: Low Risk  (02/23/2020)   Overall Financial Resource Strain (CARDIA)    Difficulty of Paying Living Expenses: Not hard at all  Food Insecurity: No Food Insecurity (02/23/2020)   Hunger Vital Sign    Worried About Running Out of Food in the Last Year: Never true    [place] in the Last Year: Never true  Transportation Needs: No Transportation Needs (02/23/2020)   PRAPARE - Hydrologist (Medical): No    Lack of Transportation (Non-Medical): No  Physical Activity: Inactive (02/23/2020)   Exercise Vital Sign    Days of Exercise per Week: 0 days    Minutes of Exercise per Session: 0 min  Stress: No Stress Concern Present (02/23/2020)   [place]    Feeling of Stress : Only a little  Social Connections: Moderately Integrated (02/23/2020)   Social Connection and Isolation Panel [NHANES]    Frequency of Communication with Friends and Family: More than three times a week    Frequency of Social Gatherings with Friends and Family: Once a week    Attends Religious Services: 1 to 4 times per year    Active Member of [employer] or Organizations: No    Attends Archivist Meetings: Never    Marital Status: Married  Human resources officer Violence: Not At Risk (02/23/2020)   Humiliation, Afraid, Rape, and Kick questionnaire    Fear of Current or Ex-Partner: No    Emotionally Abused: No    Physically Abused: No    Sexually Abused: No    Past Medical History, Surgical history, Social history, and Family history were reviewed and updated as appropriate.   Please see review of systems for further details on the patient's review from today.   Objective:   Physical Exam:  There were no vitals taken for this visit.  Physical Exam Constitutional:      General: She is not in acute distress.    Appearance: She is  well-developed.  Musculoskeletal:        General: No deformity.  Neurological:     Mental Status: She is alert and oriented to person, place, and time.  Cranial Nerves: No dysarthria.     Coordination: Coordination normal.  Psychiatric:        Attention and Perception: Attention normal. She is attentive.        Mood and Affect: Mood is anxious. Mood is not depressed. Affect is not labile, blunt, angry, tearful or inappropriate.        Speech: Speech normal. Speech is not rapid and pressured.        Behavior: Behavior normal. Behavior is cooperative.        Thought Content: Thought content normal. Thought content is not paranoid or delusional. Thought content does not include homicidal or suicidal ideation. Thought content does not include suicidal plan.        Cognition and Memory: Cognition and memory normal.        Judgment: Judgment normal.     Comments: Insight fair-good.   Depression is better with duloxetine Talkative chronically     Lab Review:     Component Value Date/Time   NA 137 08/01/2021 1427   NA 139 12/27/2020 1237   NA 141 08/01/2016 1527   K 4.5 08/01/2021 1427   K 4.6 08/01/2016 1527   CL 102 08/01/2021 1427   CO2 24 08/01/2021 1427   CO2 24 08/01/2016 1527   GLUCOSE 101 (H) 08/01/2021 1427   GLUCOSE 93 08/01/2016 1527   BUN 39 (H) 08/01/2021 1427   BUN 30 (H) 12/27/2020 1237   BUN 19.9 08/01/2016 1527   CREATININE 1.92 (H) 08/01/2021 1427   CREATININE 0.9 08/01/2016 1527   CALCIUM 9.7 08/01/2021 1427   CALCIUM 9.7 08/01/2016 1527   PROT 6.9 08/01/2021 1427   PROT 7.2 08/01/2016 1527   PROT 6.8 08/01/2016 1527   ALBUMIN 4.7 08/01/2021 1427   ALBUMIN 4.0 08/01/2016 1527   AST 19 08/01/2021 1427   AST 41 (H) 08/01/2016 1527   ALT 19 08/01/2021 1427   ALT 25 08/01/2016 1527   ALKPHOS 35 (L) 08/01/2021 1427   ALKPHOS 48 08/01/2016 1527   BILITOT 0.5 08/01/2021 1427   BILITOT 0.33 08/01/2016 1527   GFRNONAA 37 (L) 03/28/2021 0054   GFRAA 76  05/28/2019 1609       Component Value Date/Time   WBC 5.6 08/16/2021 1310   RBC 3.16 (L) 08/16/2021 1310   HGB 10.3 (L) 08/16/2021 1310   HGB 9.5 (L) 08/01/2016 1527   HCT 29.6 (L) 08/16/2021 1310   HCT 31.2 (L) 08/01/2016 1527   PLT 147.0 (L) 08/16/2021 1310   PLT 221 08/01/2016 1527   MCV 93.6 08/16/2021 1310   MCV 83.9 08/01/2016 1527   MCH 31.4 03/28/2021 0054   MCHC 34.9 08/16/2021 1310   RDW 13.1 08/16/2021 1310   RDW 17.2 (H) 08/01/2016 1527   LYMPHSABS 1.1 08/16/2021 1310   LYMPHSABS 2.0 08/01/2016 1527   MONOABS 0.6 08/16/2021 1310   MONOABS 0.4 08/01/2016 1527   EOSABS 0.1 08/16/2021 1310   EOSABS 0.1 08/01/2016 1527   BASOSABS 0.0 08/16/2021 1310   BASOSABS 0.0 08/01/2016 1527    No results found for: "POCLITH", "LITHIUM"   No results found for: "PHENYTOIN", "PHENOBARB", "VALPROATE", "CBMZ"   Endo checked thyroid lately.   Vitamin D level on April 01, 2018 reported as 40.  After that vitamin D 50,000 units weekly was prescribed with a goal of achieving levels in the 50s and 60s.  December 04, 2018 vitamin D 25 off supplement .res Assessment: Plan:    Dula was seen today for follow-up, depression, anxiety  and sleeping problem.  Diagnoses and all orders for this visit:  Major depressive disorder, recurrent episode, moderate (HCC) -     DULoxetine (CYMBALTA) 30 MG capsule; Take 3 capsules (90 mg total) by mouth daily.  Seasonal depression ([place]) -     DULoxetine (CYMBALTA) 30 MG capsule; Take 3 capsules (90 mg total) by mouth daily.  Panic disorder with agoraphobia -     DULoxetine (CYMBALTA) 30 MG capsule; Take 3 capsules (90 mg total) by mouth daily.  Generalized anxiety disorder -     DULoxetine (CYMBALTA) 30 MG capsule; Take 3 capsules (90 mg total) by mouth daily.  Insomnia due to mental condition  Delayed sleep phase syndrome     Greater than 50% of  30 min face to face time with patient was spent on counseling and coordination of  care. Hx.TRD better again with duloxetine again.  We discussed options for dealing with this. If needed consider lamotrigine.  Pt hx mixed sx so consider unconventional alternatives.  Discussed potential benefits, risks, and side effects of stimulants with patient to include increased heart rate, palpitations, insomnia, increased anxiety, increased irritability, or decreased appetite.  Instructed patient to contact office if experiencing any significant tolerability issues. She does not appear manic and not sig different from the last visit. Still talkative on it but not manic.  CO poor effect with Viibryd and duloxetine did help FM more and pain. Better with switch back to duloxetine 90 mg daily (*took 120 mg before)  Educated that she cannot expect to sleep solid through the night.  Explained.   Also disc sleep hygiene.   She's frustrated with sleep.  Trial of Bz bc worry is part of it but risk SE and risk tolerance.  Disc fall risk with sleep meds. Disc timing of Lunesta but she insists on taking it early instead. Cont prn Lunesta 2 mg HS DT age.  Prefer empty stomach. She has chronic insomnia.  Option counseling but availability is limited for options. But on list for [facility]  Repeat vitamin D level was 47.  Previously high.  Restarted 2000 units daily.   Been off supplement since July 2021    FU 4-6 mos   Lynder Parents, MD, DFAPA   Please see After Visit Summary for patient specific instructions.  Future Appointments  Date Time Provider [place]  10/10/2021  2:30 PM [facility]  02/15/2022  1:00 PM [name], Reynold Bowen, MD [facility] None  02/16/2022 12:40 PM [name], MD [facility]    No orders of the defined types were placed in this encounter.      -------------------------------

## 2021-09-21 DIAGNOSIS — N1832 Chronic kidney disease, stage 3b: Secondary | ICD-10-CM | POA: Diagnosis not present

## 2021-09-22 ENCOUNTER — Other Ambulatory Visit: Payer: Self-pay | Admitting: Family

## 2021-09-23 DIAGNOSIS — I10 Essential (primary) hypertension: Secondary | ICD-10-CM | POA: Diagnosis not present

## 2021-09-26 ENCOUNTER — Other Ambulatory Visit: Payer: Self-pay | Admitting: Family Medicine

## 2021-09-26 DIAGNOSIS — Z1231 Encounter for screening mammogram for malignant neoplasm of breast: Secondary | ICD-10-CM

## 2021-09-28 ENCOUNTER — Other Ambulatory Visit: Payer: Self-pay | Admitting: Cardiology

## 2021-09-28 ENCOUNTER — Other Ambulatory Visit: Payer: Self-pay | Admitting: Psychiatry

## 2021-09-28 DIAGNOSIS — F5105 Insomnia due to other mental disorder: Secondary | ICD-10-CM

## 2021-09-28 DIAGNOSIS — G4721 Circadian rhythm sleep disorder, delayed sleep phase type: Secondary | ICD-10-CM

## 2021-09-28 DIAGNOSIS — I10 Essential (primary) hypertension: Secondary | ICD-10-CM

## 2021-10-10 ENCOUNTER — Ambulatory Visit: Payer: Medicare PPO

## 2021-10-12 ENCOUNTER — Ambulatory Visit (INDEPENDENT_AMBULATORY_CARE_PROVIDER_SITE_OTHER): Payer: Medicare PPO

## 2021-10-12 DIAGNOSIS — Z7901 Long term (current) use of anticoagulants: Secondary | ICD-10-CM | POA: Diagnosis not present

## 2021-10-12 LAB — POCT INR: INR: 1.7 — AB (ref 2.0–3.0)

## 2021-10-12 NOTE — Patient Instructions (Addendum)
Pre visit review using our clinic review tool, if applicable. No additional management support is needed unless otherwise documented below in the visit note.  Increase dose today to take 1 1/2 tablets and then change weekly dose to take  1/2 tablet daily except take 1 tablet on Mondays, Wednesdays and Saturdays. Re-check in 3 weeks.

## 2021-10-12 NOTE — Progress Notes (Addendum)
Increase dose today to take 1 1/2 tablet and then  change weekly dose 1/2 tablet daily except take 1 tablet on Mondays, Wednesdays and Saturdays. Re-check in 3 weeks.

## 2021-10-21 ENCOUNTER — Other Ambulatory Visit: Payer: Self-pay | Admitting: Family Medicine

## 2021-10-21 ENCOUNTER — Other Ambulatory Visit: Payer: Self-pay | Admitting: Cardiology

## 2021-10-21 DIAGNOSIS — R600 Localized edema: Secondary | ICD-10-CM

## 2021-10-24 ENCOUNTER — Ambulatory Visit: Payer: Medicare PPO

## 2021-10-24 DIAGNOSIS — I10 Essential (primary) hypertension: Secondary | ICD-10-CM | POA: Diagnosis not present

## 2021-10-30 ENCOUNTER — Other Ambulatory Visit: Payer: Self-pay | Admitting: Psychiatry

## 2021-10-31 NOTE — Telephone Encounter (Signed)
Ok to send

## 2021-11-02 ENCOUNTER — Ambulatory Visit (INDEPENDENT_AMBULATORY_CARE_PROVIDER_SITE_OTHER): Payer: Medicare PPO

## 2021-11-02 DIAGNOSIS — Z7901 Long term (current) use of anticoagulants: Secondary | ICD-10-CM

## 2021-11-02 DIAGNOSIS — N189 Chronic kidney disease, unspecified: Secondary | ICD-10-CM | POA: Diagnosis not present

## 2021-11-02 DIAGNOSIS — N1832 Chronic kidney disease, stage 3b: Secondary | ICD-10-CM | POA: Diagnosis not present

## 2021-11-02 LAB — POCT INR: INR: 2.9 (ref 2.0–3.0)

## 2021-11-02 NOTE — Progress Notes (Signed)
Continue 1/2 tablet daily except take 1 tablet on Mondays, Wednesdays and Saturdays. Re-check in 4 weeks.

## 2021-11-02 NOTE — Patient Instructions (Addendum)
Pre visit review using our clinic review tool, if applicable. No additional management support is needed unless otherwise documented below in the visit note.  Continue 1/2 tablet daily except take 1 tablet on Mondays, Wednesdays and Saturdays. Re-check in 4 weeks.

## 2021-11-07 ENCOUNTER — Other Ambulatory Visit: Payer: Self-pay

## 2021-11-07 ENCOUNTER — Telehealth: Payer: Self-pay

## 2021-11-07 DIAGNOSIS — Z7901 Long term (current) use of anticoagulants: Secondary | ICD-10-CM

## 2021-11-07 MED ORDER — WARFARIN SODIUM 5 MG PO TABS: ORAL_TABLET | ORAL | 2 refills | Status: DC

## 2021-11-07 NOTE — Telephone Encounter (Signed)
Pt requested refill of warfarin. Pt compliant with warfarin management and PCP visits. Sent in refill for warfarin.

## 2021-11-08 DIAGNOSIS — R300 Dysuria: Secondary | ICD-10-CM | POA: Diagnosis not present

## 2021-11-08 DIAGNOSIS — D631 Anemia in chronic kidney disease: Secondary | ICD-10-CM | POA: Diagnosis not present

## 2021-11-08 DIAGNOSIS — E1122 Type 2 diabetes mellitus with diabetic chronic kidney disease: Secondary | ICD-10-CM | POA: Diagnosis not present

## 2021-11-08 DIAGNOSIS — R0602 Shortness of breath: Secondary | ICD-10-CM | POA: Diagnosis not present

## 2021-11-08 DIAGNOSIS — N1832 Chronic kidney disease, stage 3b: Secondary | ICD-10-CM | POA: Diagnosis not present

## 2021-11-08 DIAGNOSIS — I129 Hypertensive chronic kidney disease with stage 1 through stage 4 chronic kidney disease, or unspecified chronic kidney disease: Secondary | ICD-10-CM | POA: Diagnosis not present

## 2021-11-14 ENCOUNTER — Ambulatory Visit: Payer: Medicare PPO

## 2021-11-16 ENCOUNTER — Other Ambulatory Visit: Payer: Self-pay | Admitting: Cardiology

## 2021-11-16 ENCOUNTER — Ambulatory Visit (INDEPENDENT_AMBULATORY_CARE_PROVIDER_SITE_OTHER): Payer: Medicare PPO

## 2021-11-16 VITALS — Ht 64.0 in | Wt 145.0 lb

## 2021-11-16 DIAGNOSIS — I10 Essential (primary) hypertension: Secondary | ICD-10-CM

## 2021-11-16 DIAGNOSIS — Z0000 Encounter for general adult medical examination without abnormal findings: Secondary | ICD-10-CM | POA: Diagnosis not present

## 2021-11-16 NOTE — Patient Instructions (Signed)
[name] , Thank you for taking time to come for your Medicare Wellness Visit. I appreciate your ongoing commitment to your health goals. Please review the following plan we discussed and let me know if I can assist you in the future.   Screening recommendations/referrals: Colonoscopy: no longer required  Mammogram: no longer required  Bone Density: 08/09/2018 Recommended yearly ophthalmology/optometry visit for glaucoma screening and checkup Recommended yearly dental visit for hygiene and checkup  Vaccinations: Influenza vaccine: completed  Pneumococcal vaccine: completed  Tdap vaccine: due  Shingles vaccine: completed    Covid-19:completed   Advanced directives: yes in chart   Conditions/risks identified: Aim for 30 minutes of exercise or brisk walking, 6-8 glasses of water, and 5 servings of fruits and vegetables each day.   Next appointment: Follow up in one year for your annual wellness visit    Preventive Care 65 Years and Older, Female Preventive care refers to lifestyle choices and visits with your health care provider that can promote health and wellness. What does preventive care include? A yearly physical exam. This is also called an annual well check. Dental exams once or twice a year. Routine eye exams. Ask your health care provider how often you should have your eyes checked. Personal lifestyle choices, including: Daily care of your teeth and gums. Regular physical activity. Eating a healthy diet. Avoiding tobacco and drug use. Limiting alcohol use. Practicing safe sex. Taking low-dose aspirin every day. Taking vitamin and mineral supplements as recommended by your health care provider. What happens during an annual well check? The services and screenings done by your health care provider during your annual well check will depend on your age, overall health, lifestyle risk factors, and family history of disease. Counseling  Your health care provider may ask  you questions about your: Alcohol use. Tobacco use. Drug use. Emotional well-being. Home and relationship well-being. Sexual activity. Eating habits. History of falls. Memory and ability to understand (cognition). Work and work Statistician. Reproductive health. Screening  You may have the following tests or measurements: Height, weight, and BMI. Blood pressure. Lipid and cholesterol levels. These may be checked every 5 years, or more frequently if you are over 42 years old. Skin check. Lung cancer screening. You may have this screening every year starting at age 56 if you have a 30-pack-year history of smoking and currently smoke or have quit within the past 15 years. Fecal occult blood test (FOBT) of the stool. You may have this test every year starting at age 26. Flexible sigmoidoscopy or colonoscopy. You may have a sigmoidoscopy every 5 years or a colonoscopy every 10 years starting at age 70. Hepatitis C blood test. Hepatitis B blood test. Sexually transmitted disease (STD) testing. Diabetes screening. This is done by checking your blood sugar (glucose) after you have not eaten for a while (fasting). You may have this done every 1-3 years. Bone density scan. This is done to screen for osteoporosis. You may have this done starting at age 35. Mammogram. This may be done every 1-2 years. Talk to your health care provider about how often you should have regular mammograms. Talk with your health care provider about your test results, treatment options, and if necessary, the need for more tests. Vaccines  Your health care provider may recommend certain vaccines, such as: Influenza vaccine. This is recommended every year. Tetanus, diphtheria, and acellular pertussis (Tdap, Td) vaccine. You may need a Td booster every 10 years. Zoster vaccine. You may need this after age  60. Pneumococcal 13-valent conjugate (PCV13) vaccine. One dose is recommended after age 38. Pneumococcal  polysaccharide (PPSV23) vaccine. One dose is recommended after age 3. Talk to your health care provider about which screenings and vaccines you need and how often you need them. This information is not intended to replace advice given to you by your health care provider. Make sure you discuss any questions you have with your health care provider. Document Released: 04/02/2015 Document Revised: 11/24/2015 Document Reviewed: 01/05/2015 Elsevier Interactive Patient Education  2017 [employer] Prevention in the Home Falls can cause injuries. They can happen to people of all ages. There are many things you can do to make your home safe and to help prevent falls. What can I do on the outside of my home? Regularly fix the edges of walkways and driveways and fix any cracks. Remove anything that might make you trip as you walk through a door, such as a raised step or threshold. Trim any bushes or trees on the path to your home. Use bright outdoor lighting. Clear any walking paths of anything that might make someone trip, such as rocks or tools. Regularly check to see if handrails are loose or broken. Make sure that both sides of any steps have handrails. Any raised decks and porches should have guardrails on the edges. Have any leaves, snow, or ice cleared regularly. Use sand or salt on walking paths during winter. Clean up any spills in your garage right away. This includes oil or grease spills. What can I do in the bathroom? Use night lights. Install grab bars by the toilet and in the tub and shower. Do not use towel bars as grab bars. Use non-skid mats or decals in the tub or shower. If you need to sit down in the shower, use a plastic, non-slip stool. Keep the floor dry. Clean up any water that spills on the floor as soon as it happens. Remove soap buildup in the tub or shower regularly. Attach bath mats securely with double-sided non-slip rug tape. Do not have throw rugs and other  things on the floor that can make you trip. What can I do in the bedroom? Use night lights. Make sure that you have a light by your bed that is easy to reach. Do not use any sheets or blankets that are too big for your bed. They should not hang down onto the floor. Have a firm chair that has side arms. You can use this for support while you get dressed. Do not have throw rugs and other things on the floor that can make you trip. What can I do in the kitchen? Clean up any spills right away. Avoid walking on wet floors. Keep items that you use a lot in easy-to-reach places. If you need to reach something above you, use a strong step stool that has a grab bar. Keep electrical cords out of the way. Do not use floor polish or wax that makes floors slippery. If you must use wax, use non-skid floor wax. Do not have throw rugs and other things on the floor that can make you trip. What can I do with my stairs? Do not leave any items on the stairs. Make sure that there are handrails on both sides of the stairs and use them. Fix handrails that are broken or loose. Make sure that handrails are as long as the stairways. Check any carpeting to make sure that it is firmly attached to the stairs. Fix  any carpet that is loose or worn. Avoid having throw rugs at the top or bottom of the stairs. If you do have throw rugs, attach them to the floor with carpet tape. Make sure that you have a light switch at the top of the stairs and the bottom of the stairs. If you do not have them, ask someone to add them for you. What else can I do to help prevent falls? Wear shoes that: Do not have high heels. Have rubber bottoms. Are comfortable and fit you well. Are closed at the toe. Do not wear sandals. If you use a stepladder: Make sure that it is fully opened. Do not climb a closed stepladder. Make sure that both sides of the stepladder are locked into place. Ask someone to hold it for you, if possible. Clearly  mark and make sure that you can see: Any grab bars or handrails. First and last steps. Where the edge of each step is. Use tools that help you move around (mobility aids) if they are needed. These include: Canes. Walkers. Scooters. Crutches. Turn on the lights when you go into a dark area. Replace any light bulbs as soon as they burn out. Set up your furniture so you have a clear path. Avoid moving your furniture around. If any of your floors are uneven, fix them. If there are any pets around you, be aware of where they are. Review your medicines with your doctor. Some medicines can make you feel dizzy. This can increase your chance of falling. Ask your doctor what other things that you can do to help prevent falls. This information is not intended to replace advice given to you by your health care provider. Make sure you discuss any questions you have with your health care provider. Document Released: 12/31/2008 Document Revised: 08/12/2015 Document Reviewed: 04/10/2014 Elsevier Interactive Patient Education  2017 [employer].

## 2021-11-16 NOTE — Progress Notes (Signed)
Subjective:   Andrea Simmons is a 81 y.o. female who presents for Medicare Annual (Subsequent) preventive examination.   Virtual Visit via Telephone Note  I connected with  Andrea Simmons on 11/16/21 at 12:45 PM EDT by telephone and verified that I am speaking with the correct person using two identifiers.  Location: Patient: home  Provider: [facility]  Persons participating in the virtual visit: patient/Nurse Health Advisor   I discussed the limitations, risks, security and privacy concerns of performing an evaluation and management service by telephone and the availability of in person appointments. The patient expressed understanding and agreed to proceed.  Interactive audio and video telecommunications were attempted between this nurse and patient, however failed, due to patient having technical difficulties OR patient did not have access to video capability.  We continued and completed visit with audio only.  Some vital signs may be absent or patient reported.   Daphane Shepherd, LPN  Review of Systems     Cardiac Risk Factors include: advanced age (>92mn, >>56women);diabetes mellitus;dyslipidemia     Objective:    Today's Vitals   11/16/21 1253  Weight: 145 lb (65.8 kg)  Height: '5\' 4"'  (1.626 m)   Body mass index is 24.89 kg/m.     11/16/2021    1:06 PM 05/02/2021   11:37 AM 03/27/2021   11:59 PM 03/05/2021    9:06 PM 11/25/2020   11:18 AM 11/04/2020    5:43 PM 10/18/2020    9:18 AM  Advanced Directives  Does Patient Have a Medical Advance Directive? Yes Yes No No Yes Yes No  Type of AParamedicof ATomeLiving will    Living will Living will   Does patient want to make changes to medical advance directive? No - Patient declined No - Patient declined    No - Patient declined   [place]? Yes - validated most recent copy scanned in chart (See row information)        Would patient like information on  creating a medical advance directive?   No - Patient declined No - Patient declined No - Patient declined No - Patient declined No - Patient declined    Current Medications (verified) Outpatient Encounter Medications as of 11/16/2021  Medication Sig   ACCU-CHEK GUIDE test strip CHECK BLOOD SUGARS DAILY   Accu-Chek Softclix Lancets lancets USE AS INSTRUCTED TO TEST SUGARS TWICE A DAY   amLODipine (NORVASC) 10 MG tablet TAKE 1 TABLET BY MOUTH EVERY DAY   Biotin 1000 MCG tablet Take 1,000 mcg by mouth daily.   Blood Glucose Monitoring Suppl (ACCU-CHEK GUIDE) w/Device KIT 1 each by Does not apply route 2 (two) times daily. To test sugars. Dx. E11.9   Colchicine (MITIGARE) 0.6 MG CAPS Take 1 capsule by mouth 2 (two) times daily as needed.   DULoxetine (CYMBALTA) 30 MG capsule Take 3 capsules (90 mg total) by mouth daily.   eszopiclone (LUNESTA) 2 MG TABS tablet TAKE 1 TABLET (2 MG TOTAL) BY MOUTH IMMEDIATELY BEFORE BEDTIME AS NEEDED FOR SLEEP   fenofibrate (TRICOR) 48 MG tablet Take 48 mg by mouth daily.   ferrous sulfate 325 (65 FE) MG tablet TAKE 1 TABLET BY MOUTH EVERY DAY   furosemide (LASIX) 20 MG tablet TAKE 1 TABLET BY MOUTH EVERY DAY AS NEEDED   hydrALAZINE (APRESOLINE) 50 MG tablet TAKE 1 TABLET BY MOUTH THREE TIMES A DAY   labetalol (NORMODYNE) 200 MG tablet  Take 1 tablet (200 mg total) by mouth 2 (two) times daily.   levothyroxine (SYNTHROID) 25 MCG tablet TAKE 1 TABLET BY MOUTH EVERY DAY BEFORE BREAKFAST   linagliptin (TRADJENTA) 5 MG TABS tablet Take 5 mg by mouth daily.   Magnesium 500 MG TABS Take 500 mg by mouth daily.   rosuvastatin (CRESTOR) 20 MG tablet Take 20 mg by mouth daily.   tiZANidine (ZANAFLEX) 4 MG tablet Take 1 tablet (4 mg total) by mouth every 6 (six) hours as needed for muscle spasms.   valsartan (DIOVAN) 320 MG tablet TAKE 1 TABLET BY MOUTH EVERY DAY   Vitamin D, Ergocalciferol, (DRISDOL) 1.25 MG (50000 UNIT) CAPS capsule TAKE 1 CAPSULE BY MOUTH ONE TIME PER WEEK    warfarin (COUMADIN) 5 MG tablet TAKE 1/2 TABLET (2.5 MG) BY MOUTH DAILY EXCEPT TAKE 1 TABLET (5 MG) ON MONDAYS, WEDNESDAYS AND SATURDAYS OR AS DIRECTED BY ANTICOAGULATION CLINIC   enoxaparin (LOVENOX) 60 MG/0.6ML injection Inject 0.6 mLs (60 mg total) into the skin daily. (Patient not taking: Reported on 11/16/2021)   FLAREX 0.1 % ophthalmic suspension Apply to eye. (Patient not taking: Reported on 11/16/2021)   nitroGLYCERIN (NITROSTAT) 0.4 MG SL tablet Place 1 tablet (0.4 mg total) under the tongue every 5 (five) minutes as needed for chest pain.   pantoprazole (PROTONIX) 40 MG tablet Take 1 tablet (40 mg total) by mouth 2 (two) times daily before a meal.   RESTASIS MULTIDOSE 0.05 % ophthalmic emulsion Place 1 drop into both eyes 2 (two) times daily.  (Patient not taking: Reported on 11/16/2021)   No facility-administered encounter medications on file as of 11/16/2021.    Allergies (verified) Lipitor [atorvastatin], Morphine, Meperidine, and Naproxen sodium   History: Past Medical History:  Diagnosis Date   Anemia    Anxiety    Breast cancer ([place])    Depression    Diabetes mellitus type II    Fibromyalgia    GERD (gastroesophageal reflux disease)    Hyperlipidemia    Hypertension    Malignant neoplasm of breast (female), unspecified site 1993   L breast s/p mastectomy and tamoxifen x 50yr   Other pulmonary embolism and infarction 2008 and 2009   chronic anticoag - [facility]   Unspecified hypothyroidism    Past Surgical History:  Procedure Laterality Date   APPENDECTOMY     BALLOON DILATION N/A 10/18/2020   Procedure: BALLOON DILATION;  Surgeon: CEloise Harman DO;  [facility];  Service: Endoscopy;  Laterality: N/A;   BIOPSY  10/18/2020   Procedure: BIOPSY;  Surgeon: CEloise Harman DO;  [facility];  Service: Endoscopy;;  gastric    BREAST BIOPSY Left 1993   BREAST BIOPSY Right 09/25/2014   stero. Benign   BREAST RECONSTRUCTION Left    CATARACT  EXTRACTION     ESOPHAGOGASTRODUODENOSCOPY (EGD) WITH PROPOFOL N/A 10/18/2020   Procedure: ESOPHAGOGASTRODUODENOSCOPY (EGD) WITH PROPOFOL;  Surgeon: CEloise Harman DO;  [facility];  Service: Endoscopy;  Laterality: N/A;  11:00am   MASTECTOMY Left    ROTATOR CUFF REPAIR     SPINAL FUSION      x 2   TOTAL ABDOMINAL HYSTERECTOMY W/ BILATERAL SALPINGOOPHORECTOMY     TUBAL LIGATION     Family History  Problem Relation Age of Onset   Depression Other        Parent   Arthritis Other        Parent, Grandparent   Hypertension Other  Grandparent   Hyperlipidemia Other        [place]   Miscarriages / Stillbirths Other        Grandparent   Stroke Other        [place]   Cancer Maternal Uncle        prostate   Hypertension Maternal Grandfather    Breast cancer Cousin    Breast cancer Cousin    Social History   Socioeconomic History   Marital status: Married    Spouse name: Not on file   Number of children: 1   Years of education: Not on file   Highest education level: Not on file  Occupational History   Occupation: Armed forces technical officer: RETIRED   Occupation: Emergency planning/management officer   Occupation: school bus driver  Tobacco Use   Smoking status: Never   Smokeless tobacco: Never  Scientific laboratory technician Use: Never used  Substance and Sexual Activity   Alcohol use: No    Alcohol/week: 0.0 standard drinks of alcohol   Drug use: No   Sexual activity: Not Currently  Other Topics Concern   Not on file  Social History Narrative   Not on file   Social Determinants of Health   Financial Resource Strain: Low Risk  (11/16/2021)   Overall Financial Resource Strain (CARDIA)    Difficulty of Paying Living Expenses: Not hard at all  Food Insecurity: No Food Insecurity (11/16/2021)   Hunger Vital Sign    Worried About Running Out of Food in the Last Year: Never true    [place] in the Last Year: Never true  Transportation Needs: No Transportation Needs (11/16/2021)    PRAPARE - Hydrologist (Medical): No    Lack of Transportation (Non-Medical): No  Physical Activity: Insufficiently Active (11/16/2021)   Exercise Vital Sign    Days of Exercise per Week: 3 days    Minutes of Exercise per Session: 30 min  Stress: No Stress Concern Present (11/16/2021)   [place]    Feeling of Stress : Not at all  Social Connections: Moderately Isolated (11/16/2021)   Social Connection and Isolation Panel [NHANES]    Frequency of Communication with Friends and Family: More than three times a week    Frequency of Social Gatherings with Friends and Family: More than three times a week    Attends Religious Services: Never    Marine scientist or Organizations: No    Attends Music therapist: Never    Marital Status: Married    Tobacco Counseling Counseling given: Not Answered   Clinical Intake:  Pre-visit preparation completed: Yes  Pain : No/denies pain     Nutritional Risks: None Diabetes: Yes CBG done?: No Did pt. bring in CBG monitor from home?: No  How often do you need to have someone help you when you read instructions, pamphlets, or other written materials from your doctor or pharmacy?: 1 - Never What is the last grade level you completed in school?: college  Diabetic?yes Nutrition Risk Assessment:  Has the patient had any N/V/D within the last 2 months?  No  Does the patient have any non-healing wounds?  No  Has the patient had any unintentional weight loss or weight gain?  No   Diabetes:  Is the patient diabetic?  Yes  If diabetic, was a CBG obtained today?  No  Did the patient bring in  their glucometer from home?  No  How often do you monitor your CBG's? 2 x day .   Financial Strains and Diabetes Management:  Are you having any financial strains with the device, your supplies or your medication? No .  Does the patient want to  be seen by Chronic Care Management for management of their diabetes?  No  Would the patient like to be referred to a Nutritionist or for Diabetic Management?  No   Diabetic Exams:  Diabetic Eye Exam: Completed appointment 12/14/2021 Diabetic Foot Exam: Overdue, Pt has been advised about the importance in completing this exam. Pt is scheduled for diabetic foot exam on next office visit .   Interpreter Needed?: No  Information entered by :: Jadene Pierini , LPN   Activities of Daily Living    11/16/2021    1:07 PM 02/01/2021   12:34 PM  In your present state of health, do you have any difficulty performing the following activities:  Hearing? 0 0  Vision? 0 0  Difficulty concentrating or making decisions? 0 0  Walking or climbing stairs? 0 0  Dressing or bathing? 0 0  Doing errands, shopping? 0 0  Preparing Food and eating ? N   Using the Toilet? N   In the past six months, have you accidently leaked urine? N   Do you have problems with loss of bowel control? N   Managing your Medications? N   Managing your Finances? N   Housekeeping or managing your Housekeeping? N     Patient Care Team: [name], MD as PCP - General (Family Medicine) Irene Shipper, MD as Consulting Physician (Gastroenterology) Deneise Lever, MD as Consulting Physician (Pulmonary Disease) Jacelyn Pi, MD as Consulting Physician (Endocrinology) Valinda Party, MD as Consulting Physician (Rheumatology) [name], Billey Co., MD as Attending Physician (Psychiatry) Renie Ora, MD as Referring Physician (Anesthesiology) Brunetta Genera, MD as Consulting Physician (Hematology)  Indicate any recent Medical Services you may have received from other than [facility] providers in the past year (date may be approximate).     Assessment:   This is a routine wellness examination for Deeya.  Hearing/Vision screen Vision Screening - Comments:: Annual eye exams wear glasses   Dietary issues and  exercise activities discussed: Current Exercise Habits: The patient does not participate in regular exercise at present, Exercise limited by: cardiac condition(s)   Goals Addressed               This Visit's Progress     patient (pt-stated)   On track     Continue to be active.        Depression Screen    11/16/2021    1:04 PM 08/01/2021    1:33 PM 03/16/2021   10:01 AM 02/01/2021   12:35 PM 12/10/2020    1:06 PM 09/23/2020   11:09 AM 02/23/2020    3:23 PM  PHQ 2/9 Scores  PHQ - 2 Score 0 1 0 3 0 0 1  PHQ- 9 Score  4 0 12 5 0     Fall Risk    11/16/2021   12:58 PM 08/01/2021    1:33 PM 03/16/2021   10:00 AM 02/01/2021   12:36 PM 12/10/2020    1:05 PM  Fall Risk   Falls in the past year? 0 '1 1 1 1  ' Number falls in past yr: 0 1 0 1 1  Injury with Fall? 0 0 1 0 0  Risk for  fall due to : No Fall Risks History of fall(s) History of fall(s) History of fall(s);Impaired balance/gait   Follow up Falls prevention discussed Falls evaluation completed Falls evaluation completed Falls evaluation completed Falls evaluation completed    FALL RISK PREVENTION PERTAINING TO THE HOME:  Any stairs in or around the home? Yes  If so, are there any without handrails? No  Home free of loose throw rugs in walkways, pet beds, electrical cords, etc? Yes  Adequate lighting in your home to reduce risk of falls? Yes   ASSISTIVE DEVICES UTILIZED TO PREVENT FALLS:  Life alert? No  Use of a cane, walker or w/c? No  Grab bars in the bathroom? Yes  Shower chair or bench in shower? No  Elevated toilet seat or a handicapped toilet? No       11/20/2014    1:14 PM  MMSE - Mini Mental State Exam  Orientation to time 5  Orientation to Place 5  Registration 3  Attention/ Calculation 5  Recall 2  Language- name 2 objects 2  Language- repeat 1  Language- follow 3 step command 3  Language- read & follow direction 1  Write a sentence 1  Copy design 1  Total score 29        11/16/2021    1:09  PM  6CIT Screen  What Year? 0 points  What month? 0 points  What time? 0 points  Count back from 20 0 points  Months in reverse 0 points  Repeat phrase 0 points  Total Score 0 points    Immunizations Immunization History  Administered Date(s) Administered   Fluad Quad(high Dose 65+) 01/10/2019, 01/26/2020   Influenza Whole 03/21/2007, 12/18/2008, 01/20/2010   Influenza, High Dose Seasonal PF 02/27/2012, 03/24/2013, 02/09/2014, 01/19/2015, 02/21/2016, 01/22/2017   Influenza, Quadrivalent, Recombinant, Inj, Pf 12/31/2017, 01/24/2021   Influenza,inj,Quad PF,6+ Mos 01/22/2013   Influenza-Unspecified 02/18/2012   Moderna Sars-Covid-2 Vaccination 05/19/2019, 06/16/2019, 02/03/2020, 09/07/2020   PFIZER(Purple Top)SARS-COV-2 Vaccination 05/19/2019   Pneumococcal Conjugate-13 04/20/2016   Pneumococcal Polysaccharide-23 03/20/2006, 03/08/2007, 12/19/2007   Td 08/27/2008   Zoster Recombinat (Shingrix) 12/20/2016, 05/09/2017   Zoster, Live 12/11/2012    TDAP status: Due, Education has been provided regarding the importance of this vaccine. Advised may receive this vaccine at local pharmacy or Health Dept. Aware to provide a copy of the vaccination record if obtained from local pharmacy or Health Dept. Verbalized acceptance and understanding.  Flu Vaccine status: Up to date  Pneumococcal vaccine status: Up to date  Covid-19 vaccine status: Completed vaccines  Qualifies for Shingles Vaccine? Yes   Zostavax completed Yes   Shingrix Completed?: Yes  Screening Tests Health Maintenance  Topic Date Due   INFLUENZA VACCINE  10/18/2021   FOOT EXAM  01/24/2022   HEMOGLOBIN A1C  02/01/2022   OPHTHALMOLOGY EXAM  02/15/2022   Pneumonia Vaccine 76+ Years old  Completed   DEXA SCAN  Completed   Zoster Vaccines- Shingrix  Completed   HPV VACCINES  Aged Out   TETANUS/TDAP  Discontinued   COVID-19 Vaccine  Discontinued   Hepatitis C Screening  Discontinued    Health Maintenance  Health  Maintenance Due  Topic Date Due   INFLUENZA VACCINE  10/18/2021    Colorectal cancer screening: No longer required.   Mammogram status: No longer required due to age.  Bone Density status: Completed 08/09/2018. Results reflect: Bone density results: OSTEOPENIA. Repeat every 5 years. 0\ Lung Cancer Screening: (Low Dose CT Chest recommended if Age 48-80 years, 76  pack-year currently smoking OR have quit w/in 15years.) does not qualify.   Lung Cancer Screening Referral: n/a  Additional Screening:  Hepatitis C Screening: does not qualify;   Vision Screening: Recommended annual ophthalmology exams for early detection of glaucoma and other disorders of the eye. Is the patient up to date with their annual eye exam?  Yes  Who is the provider or what is the name of the office in which the patient attends annual eye exams? Dr.[name] If pt is not established with a provider, would they like to be referred to a provider to establish care? No .   Dental Screening: Recommended annual dental exams for proper oral hygiene  Community Resource Referral / Chronic Care Management: CRR required this visit?  No   CCM required this visit?  No      Plan:     I have personally reviewed and noted the following in the patient's chart:   Medical and social history Use of alcohol, tobacco or illicit drugs  Current medications and supplements including opioid prescriptions. Patient is not currently taking opioid prescriptions. Functional ability and status Nutritional status Physical activity Advanced directives List of other physicians Hospitalizations, surgeries, and ER visits in previous 12 months Vitals Screenings to include cognitive, depression, and falls Referrals and appointments  In addition, I have reviewed and discussed with patient certain preventive protocols, quality metrics, and best practice recommendations. A written personalized care plan for preventive services as well as  general preventive health recommendations were provided to patient.     Daphane Shepherd, LPN   [id number]   Nurse Notes: Aim for 30 minutes of exercise or brisk walking, 6-8 glasses of water, and 5 servings of fruits and vegetables each day.

## 2021-11-23 DIAGNOSIS — I10 Essential (primary) hypertension: Secondary | ICD-10-CM | POA: Diagnosis not present

## 2021-11-24 ENCOUNTER — Ambulatory Visit: Payer: Medicare PPO

## 2021-11-24 ENCOUNTER — Other Ambulatory Visit: Payer: Self-pay | Admitting: Psychiatry

## 2021-11-24 DIAGNOSIS — G4721 Circadian rhythm sleep disorder, delayed sleep phase type: Secondary | ICD-10-CM

## 2021-11-24 DIAGNOSIS — F5105 Insomnia due to other mental disorder: Secondary | ICD-10-CM

## 2021-11-30 ENCOUNTER — Ambulatory Visit: Payer: Medicare PPO

## 2021-11-30 ENCOUNTER — Telehealth: Payer: Self-pay

## 2021-11-30 NOTE — Telephone Encounter (Signed)
Pt requested to RS apt due to [facility] out at home and not getting any sleep last night. Pt has been on abx since last seen and has finished omnicef 2 days ago. Pt agreed to go to [facility] for INR check.

## 2021-12-02 ENCOUNTER — Ambulatory Visit (INDEPENDENT_AMBULATORY_CARE_PROVIDER_SITE_OTHER): Payer: Medicare PPO

## 2021-12-02 DIAGNOSIS — Z7901 Long term (current) use of anticoagulants: Secondary | ICD-10-CM

## 2021-12-02 LAB — POCT INR: INR: 2.9 (ref 2.0–3.0)

## 2021-12-02 NOTE — Patient Instructions (Addendum)
Pre visit review using our clinic review tool, if applicable. No additional management support is needed unless otherwise documented below in the visit note.  Continue 1/2 tablet daily except take 1 tablet on Mondays, Wednesdays and Saturdays. Re-check in 4 weeks at [facility] (01/02/22).

## 2021-12-02 NOTE — Progress Notes (Signed)
Pt reports recently finishing a course of Omnicef. Continue 1/2 tablet daily except take 1 tablet on Mondays, Wednesdays and Saturdays. Re-check in 4 weeks at [facility].

## 2021-12-08 ENCOUNTER — Ambulatory Visit
Admission: RE | Admit: 2021-12-08 | Discharge: 2021-12-08 | Disposition: A | Discharge status: Home / Self Care | Payer: Medicare PPO | Source: Ambulatory Visit | Attending: Family Medicine | Admitting: Family Medicine

## 2021-12-08 DIAGNOSIS — Z1231 Encounter for screening mammogram for malignant neoplasm of breast: Secondary | ICD-10-CM

## 2021-12-19 ENCOUNTER — Other Ambulatory Visit: Payer: Self-pay | Admitting: Family Medicine

## 2021-12-23 DIAGNOSIS — I10 Essential (primary) hypertension: Secondary | ICD-10-CM | POA: Diagnosis not present

## 2022-01-02 ENCOUNTER — Ambulatory Visit: Payer: Medicare PPO

## 2022-01-09 ENCOUNTER — Telehealth: Payer: Self-pay

## 2022-01-09 NOTE — Telephone Encounter (Signed)
Called pt to schedule return visit to [facility] Clinic. No answer, no option to leave voicemail.

## 2022-01-11 ENCOUNTER — Ambulatory Visit (INDEPENDENT_AMBULATORY_CARE_PROVIDER_SITE_OTHER): Payer: Medicare PPO

## 2022-01-11 DIAGNOSIS — Z7901 Long term (current) use of anticoagulants: Secondary | ICD-10-CM

## 2022-01-11 LAB — POCT INR: INR: 3.3 — AB (ref 2.0–3.0)

## 2022-01-11 NOTE — Patient Instructions (Signed)
Decrease dose today to 1/2 tablet.  Then continue 1/2 tablet daily except take 1 tablet on Mondays, Wednesdays and Saturdays. Re-check at [facility] on 02/08/22 at 2:00.

## 2022-01-11 NOTE — Progress Notes (Signed)
Decrease dose today to 1/2 tablet.  Then continue 1/2 tablet daily except take 1 tablet on Mondays, Wednesdays and Saturdays. Re-check in 4 weeks per pt request at [facility] (02/08/22).

## 2022-01-13 ENCOUNTER — Encounter: Payer: Self-pay | Admitting: Family Medicine

## 2022-01-13 ENCOUNTER — Ambulatory Visit: Payer: Medicare PPO | Admitting: Family Medicine

## 2022-01-13 VITALS — BP 124/70 | HR 70 | Temp 97.7°F | Resp 17 | Ht 64.0 in | Wt 154.0 lb

## 2022-01-13 DIAGNOSIS — M25552 Pain in left hip: Secondary | ICD-10-CM

## 2022-01-13 DIAGNOSIS — Z23 Encounter for immunization: Secondary | ICD-10-CM

## 2022-01-13 MED ORDER — PREDNISONE 10 MG PO TABS: ORAL_TABLET | ORAL | 0 refills | Status: DC

## 2022-01-13 NOTE — Progress Notes (Signed)
   Subjective:    Patient ID: Andrea Simmons, female    DOB: 12/08/1940, 81 y.o.   MRN: [id number]  HPI L hip pain- pt reports she has had chronic L hip pain for years, dating back to a previous accident.  But she fell last week while attempting to care for her husband and landed on her L side.  Since then, she has pain that radiates down L leg.  It is intermittent but worse w/ position changes.  She reports pain improves w/ walking.  Acetaminophen provides mild and temporary relief.  Denies bowel or bladder incontinence.  No numbness of legs.   Review of Systems For ROS see HPI     Objective:   Physical Exam Vitals reviewed.  Constitutional:      General: She is not in acute distress.    Appearance: Normal appearance. She is not ill-appearing.  Cardiovascular:     Pulses: Normal pulses.  Musculoskeletal:        General: Tenderness (TTP over L greater trochanter w/ some swelling) present.  Skin:    General: Skin is warm and dry.     Findings: Bruising (bruising over L calf) present.  Neurological:     General: No focal deficit present.     Mental Status: She is alert and oriented to person, place, and time.     Comments: + SLR on L  Psychiatric:        Mood and Affect: Mood normal.        Behavior: Behavior normal.        Thought Content: Thought content normal.           Assessment & Plan:   L hip pain- new.  Pt has hx of chronic L hip pain but this is an acute exacerbation.  Very TTP over L greater trochanteric bursa.  She is able to bear weight w/o difficulty.  Leg is not rotated or shortened which would support a hip fracture.  She is on coumadin but no evidence of hematoma.  Suspect trochanteric bursitis.  Start Prednisone taper.  Cautioned her to watch her carb intake as she has diabetes and Prednisone will raise sugars.  Encouraged ice or heat.  Reviewed supportive care and red flags that should prompt return.  Pt expressed understanding and is in agreement w/ plan.

## 2022-01-13 NOTE — Patient Instructions (Signed)
Follow up as needed or as scheduled START the Prednisone as directed- 3 at the same time x3 days, then 2 at the same time x3 days, then 1 daily.  Take w/ food Continue Acetaminophen for pain Heat or Ice- whichever feels better Continue to walk as you are able IF no improvement with the prednisone or things are worsening, please let me know so we can get you to see Ortho Call with any questions or concerns Hang in there!!!

## 2022-01-17 ENCOUNTER — Telehealth: Payer: Self-pay

## 2022-01-17 DIAGNOSIS — M25552 Pain in left hip: Secondary | ICD-10-CM

## 2022-01-17 NOTE — Telephone Encounter (Signed)
If she is having that much hip pain despite Prednisone, she needs to be evaluated by Orthopedics.  Does she have a preferred Ortho office?  I am happy to refer.  Pain medication alone is not appropriate

## 2022-01-17 NOTE — Telephone Encounter (Signed)
Patient called in reports continued pain in hip and has said she cannot stand it she is asking for you to send in pain medication at this time   Please advise

## 2022-01-18 MED ORDER — HYDROCODONE-ACETAMINOPHEN 5-325 MG PO TABS: 1.0000 | ORAL_TABLET | Freq: Four times a day (QID) | ORAL | 0 refills | Status: DC | PRN

## 2022-01-18 NOTE — Telephone Encounter (Signed)
Please let pt know we have placed Orthopedic referral and I have also sent in Hydrocodone for pain

## 2022-01-18 NOTE — Addendum Note (Signed)
Addended by: [name] on: 01/18/2022 11:36 AM   Modules accepted: Orders

## 2022-01-18 NOTE — Telephone Encounter (Signed)
Pt called back to office. Pt states that she is a current pt at [facility] in [place]. Pt stated that she would like to see Dr. Jerene Pitch. Pt states that she is concern about the pain. PT also stated that she is having pain in her left outer part of her knee  and hip.

## 2022-01-18 NOTE — Telephone Encounter (Signed)
Patient has been seen at ortho care previously  Notes pain on the Lt outer part of the knee now as well

## 2022-01-18 NOTE — Telephone Encounter (Signed)
Called and informed the patient of the medication and the patient is already scheduled at ortho care was thankful for the hydrocodone and call back

## 2022-01-23 DIAGNOSIS — I10 Essential (primary) hypertension: Secondary | ICD-10-CM | POA: Diagnosis not present

## 2022-01-24 ENCOUNTER — Ambulatory Visit: Payer: Medicare PPO | Admitting: Sports Medicine

## 2022-01-24 ENCOUNTER — Encounter: Payer: Self-pay | Admitting: Sports Medicine

## 2022-01-24 ENCOUNTER — Ambulatory Visit (INDEPENDENT_AMBULATORY_CARE_PROVIDER_SITE_OTHER): Payer: Medicare PPO

## 2022-01-24 DIAGNOSIS — W19XXXA Unspecified fall, initial encounter: Secondary | ICD-10-CM

## 2022-01-24 DIAGNOSIS — M25552 Pain in left hip: Secondary | ICD-10-CM

## 2022-01-24 DIAGNOSIS — M25512 Pain in left shoulder: Secondary | ICD-10-CM

## 2022-01-24 DIAGNOSIS — R252 Cramp and spasm: Secondary | ICD-10-CM | POA: Diagnosis not present

## 2022-01-24 MED ORDER — LIDOCAINE 4 % EX PTCH: 1.0000 | MEDICATED_PATCH | CUTANEOUS | 1 refills | Status: DC

## 2022-01-24 NOTE — Patient Instructions (Signed)
Lidocaine 4% patches --> place over painful area of hip. You can get these at the local pharmacy.

## 2022-01-24 NOTE — Progress Notes (Signed)
Andrea Simmons - 81 y.o. female MRN [id number]  Date of birth: June 27, 1940  Office Visit Note: Visit Date: 01/24/2022 PCP: [name], MD Referred by: [name], MD  Subjective: Chief Complaint  Patient presents with   Left Hip - Pain   HPI: Andrea Simmons is a pleasant 81 y.o. female who presents today for evaluation of left lateral hip pain and left leg pain after mechanical fall.  Patient had a fall about 2-3 weeks ago when she was attempting to care for her husband and tripped walking and fell onto her left side.  Believes she fell onto the lateral hip.  Also feels like she may have jammed the left shoulder as well as it has been somewhat stiff since this time.  She has been ambulating with a cane because of this, occasionally certain movements will cause a jolting like sensation in the lateral hip and down the leg.  Any pain coming from the back.  She does have chronic left hip pain for many years over the trochanteric bursa region.  Also history of fibromyalgia that has flared up over this region in the past.  She is taking acetaminophen, also recently completed a prednisone pack given by her PCP.  I did provide her hydrocodone as well for breakthrough pain.  Left shoulder - she has some pain over the left lateral shoulder pointing near the acromion down into the deltoid.  This like the shoulder has been slightly stiff with motion since her fall.  Denies any bruising or redness.  Pertinent ROS were reviewed with the patient and found to be negative unless otherwise specified above in HPI.   Assessment & Plan: Visit Diagnoses:  1. Pain in left hip   2. Left shoulder pain, unspecified chronicity   3. Fall, initial encounter   4. Muscle cramping    Plan: I had a good discussion with Andrea Simmons today regarding her pain from the fall.  I cannot see any evidence of fracture on x-rays, although there is arthritic change and some spurring.  I would like to proceed with CT  scan of the left hip to evaluate for any occult fracture and better quantify the degree of her hip arthritis.  If there is no fracture, we can have her represent and we can always consider an ultrasound-guided greater trochanteric injection as she has had benefit from this in the past.  Also recommended topical lidocaine patches over the painful area of the hip.  I believe she aggravated the rotator cuff or the muscle surrounding the shoulder given her fall.  She may use ice/heat as well as keep the shoulder moving with home exercises.  For her muscle cramping, I think this was exacerbated from her fall.  Did recommend tart cherry juice nightly and some gentle compression around the calf when sleeping.  In terms of pharmacologic therapy, she will stop taking the prednisone after completion given her diabetes.  She cannot take NSAIDs given that she is on Coumadin.  She will continue taking her Norco prescription only for breakthrough pain, recommend heat and or the lidocaine patches as a first-line treatment option.  I will call her after the results of the CT scan to discuss next steps.  Follow-up: Return for Will call with Ct-results (Dr. Rolena Infante).   Meds & Orders: No orders of the defined types were placed in this encounter.   Orders Placed This Encounter  Procedures   XR Shoulder Left   XR HIP UNILAT W  OR W/O PELVIS 2-3 VIEWS LEFT   CT HIP LEFT WO CONTRAST     Procedures: No procedures performed      Clinical History: No specialty comments available.  She reports that she has never smoked. She has never used smokeless tobacco.  Recent Labs    08/01/21 1427  HGBA1C 5.6    Objective:    Physical Exam  Gen: Well-appearing, in no acute distress; non-toxic CV: Regular Rate. Well-perfused. Warm.  Resp: Breathing unlabored on room air; no wheezing. Psych: Fluid speech in conversation; appropriate affect; normal thought process Neuro: Sensation intact throughout. No gross coordination  deficits.   Ortho Exam - Left hip/leg: + TTP over the greater trochanteric region.  No surrounding swelling or bruising.  There are some mild restriction in internal and external rotation, although relatively equivocal to the contralateral leg.  There is pain with weakness with resisted hip abduction.  There is no bruising or swelling over the reported area of cramping in the calf.  Able to flex and extend the knee without difficulty.  Patient does walk with a slightly guarded limp and the use of a cane on the contralateral arm.  - Left shoulder: No AC joint TTP.  No specific bony TTP.  There is some relative TTP over the deltoid region.  Slight restriction in forward flexion and abduction of the arm, positive drop arm test.  Rotator cuff testing with intact strength.  Neurovascular intact distally.  Imaging: XR HIP UNILAT W OR W/O PELVIS 2-3 VIEWS LEFT  Result Date: 01/24/2022 2 views of the left hip including bilateral AP and lateral film were ordered and reviewed by myself.  X-rays demonstrate moderate to severe OA of the left hip.  There are some sclerosis of the inferior aspect of the acetabulum as well as some spurring off the acetabular rim.  On lateral view there is some calcification near the superior aspect of the glenoid rim and humeral head.  I cannot see evidence of acute bony fracture on x-ray, although would be better evaluated on CT scan.  XR Shoulder Left  Result Date: 01/24/2022 3 views of the left shoulder including Grashey, scapular Y and axillary views were ordered and reviewed by myself.  X-rays demonstrate no acute fracture.  There is mild-moderate AC joint arthropathy, there is a small spur off the inferior aspect of the glenoid, although no significant glenohumeral joint arthritis.  Humeral head is well located.   Past Medical/Family/Surgical/Social History: Medications & Allergies reviewed per EMR, new medications updated. Patient Active Problem List   Diagnosis Date  Noted   CKD (chronic kidney disease) stage 4, GFR 15-29 ml/min (HCC) 08/09/2021   Tear film insufficiency 04/19/2021   Sjogren's disease ([place]) 01/24/2021   Erroneous encounter - disregard 01/21/2021   Abnormal gait 12/10/2020   Hereditary and idiopathic neuropathy, unspecified 12/10/2020   Major depressive disorder, single episode, unspecified 12/10/2020   Osteoarthritis 12/10/2020   Esophageal dysphagia 09/16/2020   Dehydration 09/16/2020   AKI (acute kidney injury) ([place]) 09/16/2020   Elevated lipase 09/16/2020   Hyperglycemia due to diabetes mellitus ([place]) 09/16/2020   Vitamin D deficiency 10/01/2019   Polypharmacy 10/01/2019   Major depressive disorder, recurrent episode, moderate ([place]) 10/01/2019   Leg edema 06/04/2019   Chest pain of uncertain etiology 40/98/1191   Hx of pulmonary embolus 03/30/2017   Physical exam 11/30/2016   Hyperlipidemia 12/26/2013   Encounter for therapeutic drug monitoring 04/16/2013   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  Long term (current) use of anticoagulants 06/30/2010   Exertional dyspnea 11/09/2008   Diabetes type 2, controlled ([place]) 08/27/2008   ANEMIA-NOS 08/27/2008   Seasonal and perennial allergic rhinitis 08/27/2008   PULMONARY NODULE 08/27/2008   Fibromyalgia 08/27/2008   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 ([place]) 03/20/2007   Hypothyroidism 03/20/2007   Essential hypertension 03/20/2007   Recurrent pulmonary emboli ([place]) 03/20/2007   GERD 03/20/2007   ESOPHAGEAL STRICTURE 03/08/2007   Past Medical History:  Diagnosis Date   Anemia    Anxiety    Breast cancer ([place])    Depression    Diabetes mellitus type II    Fibromyalgia    GERD (gastroesophageal reflux disease)    Hyperlipidemia    Hypertension    Malignant neoplasm of breast (female), unspecified site 1993   L breast s/p mastectomy and tamoxifen x 29yr   Other pulmonary embolism and infarction 2008 and 2009   chronic anticoag - [facility] CC    Unspecified hypothyroidism    Family History  Problem Relation Age of Onset   Depression Other        Parent   Arthritis Other        Parent, Grandparent   Hypertension Other        Grandparent   Hyperlipidemia Other        [place]  Miscarriages / Stillbirths Other        Grandparent   Stroke Other        [place]  Cancer Maternal Uncle        prostate   Hypertension Maternal Grandfather    Breast cancer Cousin    Breast cancer Cousin    Past Surgical History:  Procedure Laterality Date   APPENDECTOMY     BALLOON DILATION N/A 10/18/2020   Procedure: BALLOON DILATION;  Surgeon: CEloise Harman DO;  [facility];  Service: Endoscopy;  Laterality: N/A;   BIOPSY  10/18/2020   Procedure: BIOPSY;  Surgeon: CEloise Harman DO;  [facility];  Service: Endoscopy;;  gastric    BREAST BIOPSY Left 1993   BREAST BIOPSY Right 09/25/2014   stero. Benign   BREAST RECONSTRUCTION Left    CATARACT EXTRACTION     ESOPHAGOGASTRODUODENOSCOPY (EGD) WITH PROPOFOL N/A 10/18/2020   Procedure: ESOPHAGOGASTRODUODENOSCOPY (EGD) WITH PROPOFOL;  Surgeon: CEloise Harman DO;  [facility];  Service: Endoscopy;  Laterality: N/A;  11:00am   MASTECTOMY Left    ROTATOR CUFF REPAIR     SPINAL FUSION      x 2   TOTAL ABDOMINAL HYSTERECTOMY W/ BILATERAL SALPINGOOPHORECTOMY     TUBAL LIGATION     Social History   Occupational History   Occupation: tArmed forces technical officer RETIRED   Occupation: hEmergency planning/management officer  Occupation: school bus driver  Tobacco Use   Smoking status: Never   Smokeless tobacco: Never  VScientific laboratory technicianUse: Never used  Substance and Sexual Activity   Alcohol use: No    Alcohol/week: 0.0 standard drinks of alcohol   Drug use: No   Sexual activity: Not Currently    **Addendum (01/26/22: 12:03pm): Patient called yesterday, returned call on 01/26/2022.  Still having some sharp pain within the hip with certain motions.  Is taking the  hydrocodone for breakthrough pain.  Discussed with her I did change to stat HIP CT-scan, left. Discussed with her if pain severely worsening, recommend ED evaluation.   DElba Barman DO [facility]  [place]  This note was dictated using Dragon naturally speaking software and may contain errors in syntax, spelling, or content which have not been identified prior to signing this note.

## 2022-01-25 ENCOUNTER — Telehealth: Payer: Self-pay | Admitting: Sports Medicine

## 2022-01-25 ENCOUNTER — Other Ambulatory Visit: Payer: Self-pay | Admitting: Psychiatry

## 2022-01-25 NOTE — Telephone Encounter (Signed)
Pt called stating her both are legs are worse since dr appt. Pt states leg are hurting so bad. Pt is asking for a call back at [phone].

## 2022-01-26 NOTE — Addendum Note (Signed)
Addended by: Renne Musca III on: 01/26/2022 12:05 PM   Modules accepted: Orders

## 2022-01-27 ENCOUNTER — Ambulatory Visit
Admission: RE | Admit: 2022-01-27 | Discharge: 2022-01-27 | Disposition: A | Discharge status: Home / Self Care | Payer: Medicare PPO | Source: Ambulatory Visit | Attending: Sports Medicine | Admitting: Sports Medicine

## 2022-01-27 DIAGNOSIS — M25552 Pain in left hip: Secondary | ICD-10-CM

## 2022-02-02 ENCOUNTER — Encounter: Payer: Self-pay | Admitting: Sports Medicine

## 2022-02-02 ENCOUNTER — Ambulatory Visit: Payer: Self-pay

## 2022-02-02 ENCOUNTER — Ambulatory Visit: Payer: Medicare PPO | Admitting: Sports Medicine

## 2022-02-02 DIAGNOSIS — M25512 Pain in left shoulder: Secondary | ICD-10-CM

## 2022-02-02 DIAGNOSIS — S86112A Strain of other muscle(s) and tendon(s) of posterior muscle group at lower leg level, left leg, initial encounter: Secondary | ICD-10-CM | POA: Diagnosis not present

## 2022-02-02 DIAGNOSIS — W19XXXD Unspecified fall, subsequent encounter: Secondary | ICD-10-CM | POA: Diagnosis not present

## 2022-02-02 DIAGNOSIS — M25552 Pain in left hip: Secondary | ICD-10-CM | POA: Diagnosis not present

## 2022-02-02 DIAGNOSIS — M1612 Unilateral primary osteoarthritis, left hip: Secondary | ICD-10-CM

## 2022-02-02 MED ORDER — LIDOCAINE HCL 1 % IJ SOLN
2.0000 mL | INTRAMUSCULAR | Status: AC | PRN
  Administered 2022-02-02: 2 mL

## 2022-02-02 MED ORDER — BUPIVACAINE HCL 0.25 % IJ SOLN
2.0000 mL | INTRAMUSCULAR | Status: AC | PRN
  Administered 2022-02-02: 2 mL via INTRA_ARTICULAR

## 2022-02-02 MED ORDER — METHYLPREDNISOLONE ACETATE 40 MG/ML IJ SUSP
40.0000 mg | INTRAMUSCULAR | Status: AC | PRN
  Administered 2022-02-02: 40 mg via INTRA_ARTICULAR

## 2022-02-02 NOTE — Progress Notes (Signed)
Here for CT Follow up  Saying that her shoulder is more sore today than it was previous visit

## 2022-02-02 NOTE — Progress Notes (Signed)
Andrea Simmons - 81 y.o. female MRN [id number]  Date of birth: 1940/12/29  Office Visit Note: Visit Date: 02/02/2022 PCP: [name], MD Referred by: [name], MD  Subjective: Chief Complaint  Patient presents with   Left Hip - Pain, Follow-up   HPI: Andrea Simmons is a pleasant 81 y.o. female who presents today for review of hip CT scan as well as shoulder and left leg pain.  Left hip -location still get a sharp sensation or catching sensation within the hip with certain movements, although this has been improving as she has been able to rest appropriately here the last week or so.  Does feel some improvement and stability when walking.  He is using a cane just as backup.  Left lower extremity/calf pain and cramping: Cramping sensation is improving although she has this at baseline.  Occasionally will get a sharp pain in the calf.  Intermittent numbness and tingling.  Did have some bruising in this area in the past that was noticed by a physician, but currently has none.  This pain is also improving.  Left shoulder pain -she still feels like this has been exacerbated after her fall.  Pain radiates from the lateral part of the shoulder into the deltoid.  It is worse with certain reaching motions or reaching backward.  Denies any numbness tingling or swelling.  Pertinent ROS were reviewed with the patient and found to be negative unless otherwise specified above in HPI.   Assessment & Plan: Visit Diagnoses:  1. Pain in left hip   2. Unilateral primary osteoarthritis, left hip   3. Strain of gastrocnemius muscle of left lower extremity, initial encounter   4. Left shoulder pain, unspecified chronicity   5. Fall, subsequent encounter    Plan: Did review the CT scan of the hip with [name] today.  Fortunately, no fractures were seen.  Her CT did show rather advanced arthropathy of the hip, even more so than we did see on x-ray.  Discussed with her that I think she  had an aggravation of this hip arthritis as she still is somewhat irritable with internal rotation about the hip, although certainly improved.  She would like to hold off on seeing anyone surgical about this as this is not a good time for her to address this, which I feel is reasonable.  She may continue with her over-the-counter medication and she is taking Norco for breakthrough pain from another provider.  Her shoulder exam is very indicative of rotator cuff irritation.  Given this, through shared decision making elected to proceed with subacromial joint injection, patient tolerated well and had improvement following this.  Recommended activity modification for 2 days and then continue to keep the shoulder moving and active.  Ultrasound evaluation did show a strain with some soft tissue and intramuscular swelling within the lateral gastrocnemius.  This has continued to improve, we did discuss continuing it, topical medications and time to allow this to heal appropriately.  Would expect this to fully heal in 2-4 weeks.  We will see how she does with the shoulder injection and she will follow-up with me as needed.  Follow-up: Return if symptoms worsen or fail to improve.   Meds & Orders: No orders of the defined types were placed in this encounter.   Orders Placed This Encounter  Procedures   Large Joint Inj   [place] Extrem Low Left Ltd     Procedures: Large Joint Inj: L subacromial bursa  on 02/02/2022 4:05 PM Indications: pain Details: 22 G 1.5 in needle, posterior approach Medications: 2 mL lidocaine 1 %; 2 mL bupivacaine 0.25 %; 40 mg methylPREDNISolone acetate 40 MG/ML Outcome: tolerated well, no immediate complications  Subacromial Joint Injection, left Shoulder After discussion on risks/benefits/indications, informed verbal consent was obtained. A timeout was then performed. Patient was seated on table in exam room. The patient's shoulder was prepped with betadine and alcohol swabs and  utilizing posterior approach a 22G, 1.5" needle was directed anteriorly and laterally into the patient's subacromial space was injected with 2:2:1 mixture of lidocaine:bupivicaine:depomedrol with appreciation of free-flowing of the injectate into the bursal space. Patient tolerated the procedure well without immediate complications.   Procedure, treatment alternatives, risks and benefits explained, specific risks discussed. Consent was given by the patient. Immediately prior to procedure a time out was called to verify the correct patient, procedure, equipment, support staff and site/side marked as required. Patient was prepped and draped in the usual sterile fashion.          Clinical History: No specialty comments available.  She reports that she has never smoked. She has never used smokeless tobacco.  Recent Labs    08/01/21 1427  HGBA1C 5.6    Objective:   Vital Signs: There were no vitals taken for this visit.  Physical Exam  Gen: Well-appearing, in no acute distress; non-toxic CV: Regular Rate. Well-perfused. Warm.  Resp: Breathing unlabored on room air; no wheezing. Psych: Fluid speech in conversation; appropriate affect; normal thought process Neuro: Sensation intact throughout. No gross coordination deficits.   Ortho Exam - Left hip: There is some mild TTP over the greater trochanteric region.  Pain with FADIR testing with a mechanical block to internal rotation.   - Left calf: There is some mild soft tissue swelling over the lateral proximal calf.  There is some TTP to deep palpation near.  Resolving bruising over the distal calf.  No pain behind the popliteal fossa of the knee.  -Left shoulder: + Painful drop arm test, pain with resisted external rotation.  No gross weakness with rotator cuff testing. + Impingement test.  Imaging:  [place] Extrem Low Left Ltd Limited ultrasound evaluation of the left lower extremity and calf was  demonstrated today.  The lateral  gastrocnemius tendon was evaluated in  short and long axis with hypoechoic fluid within the mid belly of the  gastrocnemius tendon just proximal to the myotendinous junction.  There is  also some cobblestoning of soft tissue swelling superficial to the muscle  belly.  Grade 1-2, but no evidence of full-thickness tearing of the  tendon.  Medial head gastrocnemius looks intact.   Hip CT Left, 01/27/22: CLINICAL DATA:  Left hip pain.  Two falls in the last 3 weeks.   EXAM: CT OF THE LEFT HIP WITHOUT CONTRAST   TECHNIQUE: Multidetector CT imaging of the left hip was performed according to the standard protocol. Multiplanar CT image reconstructions were also generated.   RADIATION DOSE REDUCTION: This exam was performed according to the departmental dose-optimization program which includes automated exposure control, adjustment of the mA and/or kV according to patient size and/or use of iterative reconstruction technique.   COMPARISON:  Radiographs 01/24/2022   FINDINGS: Bones/Joint/Cartilage   Prominent degenerative hip arthropathy with prominent spurring of the acetabulum, femoral head, and femoral neck. Small well corticated ossicle anteromedial to the left femoral neck probably from a chronically fragmented osteophyte. Other chronically fragmented osteophytes are present along the anterior superior and  posteroinferior acetabulum. Prominent degenerative chondral thinning along portions of the hip joint.   No acute fracture is observed.   5 mm of degenerative anterolisthesis of L4 on L5 bilateral facet arthropathy at L4-5, and potential mild central narrowing of the thecal sac at this level. No fracture of the left hemipelvis is observed.   Ligaments   Suboptimally assessed by CT.   Muscles and Tendons   Unremarkable   Soft tissues   Aortoiliac atherosclerotic vascular disease. There are a few proximal sigmoid colon diverticula.   IMPRESSION: 1. No acute fracture  is identified. 2. Prominent degenerative hip arthropathy. 3. 5 mm of degenerative anterolisthesis of L4 on L5 with bilateral facet arthropathy at L4-5, and potential mild central narrowing of the thecal sac at this level. 4. Mild sigmoid colon diverticulosis. 5. Aortoiliac atherosclerotic vascular disease.   Aortic Atherosclerosis (ICD10-I70.0).     Electronically Signed   By: Van Clines M.D.   On: 01/27/2022 13:53  Past Medical/Family/Surgical/Social History: Medications & Allergies reviewed per EMR, new medications updated. 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/min (HCC) 08/09/2021   Tear film insufficiency 04/19/2021   Sjogren's disease ([place]) 01/24/2021   Erroneous encounter - disregard 01/21/2021   Abnormal gait 12/10/2020   Hereditary and idiopathic neuropathy, unspecified 12/10/2020   Major depressive disorder, single episode, unspecified 12/10/2020   Osteoarthritis 12/10/2020   Esophageal dysphagia 09/16/2020   Dehydration 09/16/2020   AKI (acute kidney injury) ([place]) 09/16/2020   Elevated lipase 09/16/2020   Hyperglycemia due to diabetes mellitus ([place]) 09/16/2020   Vitamin D deficiency 10/01/2019   Polypharmacy 10/01/2019   Major depressive disorder, recurrent episode, moderate ([place]) 10/01/2019   Leg edema 06/04/2019   Chest pain of uncertain etiology 70/62/3762   Hx of pulmonary embolus 03/30/2017   Physical exam 11/30/2016   Hyperlipidemia 12/26/2013   Encounter for therapeutic drug monitoring 04/16/2013   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   Long term (current) use of anticoagulants 06/30/2010   Exertional dyspnea 11/09/2008   Diabetes type 2, controlled ([place]) 08/27/2008   ANEMIA-NOS 08/27/2008   Seasonal and perennial allergic rhinitis 08/27/2008   PULMONARY NODULE 08/27/2008   Fibromyalgia 08/27/2008   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 ([place])  03/20/2007   Hypothyroidism 03/20/2007   Essential hypertension 03/20/2007   Recurrent pulmonary emboli ([place]) 03/20/2007   GERD 03/20/2007   ESOPHAGEAL STRICTURE 03/08/2007   Past Medical History:  Diagnosis Date   Anemia    Anxiety    Breast cancer ([place])    Depression    Diabetes mellitus type II    Fibromyalgia    GERD (gastroesophageal reflux disease)    Hyperlipidemia    Hypertension    Malignant neoplasm of breast (female), unspecified site 1993   L breast s/p mastectomy and tamoxifen x 45yr   Other pulmonary embolism and infarction 2008 and 2009   chronic anticoag - LeB CC   Unspecified hypothyroidism    Family History  Problem Relation Age of Onset   Depression Other        Parent   Arthritis Other        Parent, Grandparent   Hypertension Other        Grandparent   Hyperlipidemia Other        [place]  Miscarriages / Stillbirths Other        Grandparent   Stroke Other  [facility]   Cancer Maternal Uncle        prostate   Hypertension Maternal Grandfather    Breast cancer Cousin    Breast cancer Cousin    Past Surgical History:  Procedure Laterality Date   APPENDECTOMY     BALLOON DILATION N/A 10/18/2020   Procedure: BALLOON DILATION;  Surgeon: Eloise Harman, DO;  [facility];  Service: Endoscopy;  Laterality: N/A;   BIOPSY  10/18/2020   Procedure: BIOPSY;  Surgeon: Eloise Harman, DO;  [facility];  Service: Endoscopy;;  gastric    BREAST BIOPSY Left 1993   BREAST BIOPSY Right 09/25/2014   stero. Benign   BREAST RECONSTRUCTION Left    CATARACT EXTRACTION     ESOPHAGOGASTRODUODENOSCOPY (EGD) WITH PROPOFOL N/A 10/18/2020   Procedure: ESOPHAGOGASTRODUODENOSCOPY (EGD) WITH PROPOFOL;  Surgeon: Eloise Harman, DO;  [facility];  Service: Endoscopy;  Laterality: N/A;  11:00am   MASTECTOMY Left    ROTATOR CUFF REPAIR     SPINAL FUSION      x 2   TOTAL ABDOMINAL HYSTERECTOMY W/ BILATERAL SALPINGOOPHORECTOMY     TUBAL  LIGATION     Social History   Occupational History   Occupation: Armed forces technical officer: RETIRED   Occupation: Emergency planning/management officer   Occupation: school bus driver  Tobacco Use   Smoking status: Never   Smokeless tobacco: Never  Scientific laboratory technician Use: Never used  Substance and Sexual Activity   Alcohol use: No    Alcohol/week: 0.0 standard drinks of alcohol   Drug use: No   Sexual activity: Not Currently

## 2022-02-07 ENCOUNTER — Telehealth: Payer: Self-pay | Admitting: Family Medicine

## 2022-02-07 NOTE — Telephone Encounter (Signed)
Ok to send letter stating-  Ms Andrea Simmons had a left mastectomy in 1992 and requires a prosthesis as well as bras.  I appreciate your assistance in this matter.

## 2022-02-07 NOTE — Telephone Encounter (Signed)
Caller name: MARYHELEN LINDLER  On DPR?: Yes  Call back number: [phone]  Provider they see: [name], MD  Reason for call: Pt called stating that she need a letter from [place], stating that she had her left breast removed in 1992. The letter need to be sent to [facility] in [place]. [facility] [employer] [phone] and fax number is [phone].

## 2022-02-07 NOTE — Telephone Encounter (Signed)
Can we send this in for the pt ?

## 2022-02-08 ENCOUNTER — Ambulatory Visit (INDEPENDENT_AMBULATORY_CARE_PROVIDER_SITE_OTHER): Payer: Medicare PPO

## 2022-02-08 ENCOUNTER — Encounter: Payer: Self-pay | Admitting: Family Medicine

## 2022-02-08 DIAGNOSIS — N1832 Chronic kidney disease, stage 3b: Secondary | ICD-10-CM | POA: Diagnosis not present

## 2022-02-08 DIAGNOSIS — Z7901 Long term (current) use of anticoagulants: Secondary | ICD-10-CM | POA: Diagnosis not present

## 2022-02-08 LAB — POCT INR: INR: 2.6 (ref 2.0–3.0)

## 2022-02-08 NOTE — Telephone Encounter (Signed)
Informed pt that I faxed the letter requested to [phone].

## 2022-02-08 NOTE — Patient Instructions (Signed)
Continue 1/2 tablet daily except take 1 tablet on Mondays, Wednesdays and Saturdays. Recheck on January 6 at 2:30.

## 2022-02-08 NOTE — Progress Notes (Signed)
Continue 1/2 tablet daily except take 1 tablet on Mondays, Wednesdays and Saturdays. Recheck in 6 weeks.

## 2022-02-13 ENCOUNTER — Encounter: Payer: Self-pay | Admitting: Sports Medicine

## 2022-02-13 ENCOUNTER — Ambulatory Visit: Payer: Medicare PPO | Admitting: Sports Medicine

## 2022-02-13 DIAGNOSIS — N1832 Chronic kidney disease, stage 3b: Secondary | ICD-10-CM | POA: Diagnosis not present

## 2022-02-13 DIAGNOSIS — D631 Anemia in chronic kidney disease: Secondary | ICD-10-CM | POA: Diagnosis not present

## 2022-02-13 DIAGNOSIS — S86112D Strain of other muscle(s) and tendon(s) of posterior muscle group at lower leg level, left leg, subsequent encounter: Secondary | ICD-10-CM | POA: Diagnosis not present

## 2022-02-13 DIAGNOSIS — M79662 Pain in left lower leg: Secondary | ICD-10-CM | POA: Diagnosis not present

## 2022-02-13 DIAGNOSIS — E1122 Type 2 diabetes mellitus with diabetic chronic kidney disease: Secondary | ICD-10-CM | POA: Diagnosis not present

## 2022-02-13 DIAGNOSIS — I129 Hypertensive chronic kidney disease with stage 1 through stage 4 chronic kidney disease, or unspecified chronic kidney disease: Secondary | ICD-10-CM | POA: Diagnosis not present

## 2022-02-13 MED ORDER — PREDNISONE 20 MG PO TABS: 20.0000 mg | ORAL_TABLET | Freq: Every day | ORAL | 0 refills | Status: AC

## 2022-02-13 NOTE — Progress Notes (Signed)
Andrea Simmons - 81 y.o. female MRN [id number]  Date of birth: 1940/03/22  Office Visit Note: Visit Date: 02/13/2022 PCP: [name], MD Referred by: [name], MD  Subjective: Chief Complaint  Patient presents with   Left Leg - Pain   HPI: [name] is a pleasant 81 y.o. female who presents today for follow-up of left leg/calf pain.   At her last visit this pain in the calf was improving.  I did ultrasound it at that time which showed a grade 1-2 strain/partial tear of the gastrocnemius muscle belly.  Over the last few weeks she has had some mild worsening of her pain.  She had in the past taking very occasional Norco for some pains, she is out of this now.  This did help to some extent.  Pain is worse with standing or walking on that leg.  She denies any unilateral swelling.  No shortness of breath.  She is on Coumadin and just had her levels checked which were in the therapeutic range.  She denies any history of blood clotting since being on Coumadin.  She will occasionally get some numbness and tingling in the heel and arch of the foot, although nothing down the remainder of the leg.  Still has some mild pain in the hip with certain motions, although this is less bothersome for her.  Pertinent ROS were reviewed with the patient and found to be negative unless otherwise specified above in HPI.   Assessment & Plan: Visit Diagnoses:  1. Pain of left calf   2. Strain of gastrocnemius muscle of left lower extremity, subsequent encounter    Plan: Discussed with [name] the possible etiologies of her pain.  She does not have any asymmetrical swelling, bruising or other symptoms of DVT and she is on Coumadin so my suspicion for this is extremely low.  She has been having Pain only since her fall when she injured that calf muscle.  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  We will do a trial of prednisone 20 mg x 6 days.  For the calf pain, we did a trial of shockwave therapy today, she will return in 1 week for reevaluation and we can consider an additional treatment if she finds this to be beneficial.  We will hold off on additional Norco at this time, if nothing else helps we can consider another short course to be taken only for breakthrough pain.  We will follow-up in 1 week.  Follow-up: Return in about 1 week (around 02/20/2022).   Meds & Orders:  Meds ordered this encounter  Medications   predniSONE (DELTASONE) 20 MG tablet    Sig: Take 1 tablet (20 mg total) by mouth daily with breakfast for 6 days.    Dispense:  6 tablet    Refill:  0   No orders of the defined types were placed in this encounter.    Procedures: Procedure: ECSWT Indications:  calf strain, calf pain   Procedure Details Consent: Risks of procedure as well as the alternatives and risks of each were explained to the patient.  Verbal consent for procedure obtained. Time Out: Verified patient identification, verified procedure, site was marked, verified correct patient position. The area was cleaned with alcohol swab.     The left gastrocnemius was targeted for Extracorporeal shockwave therapy.    Preset: status  post muscular injury Power Level: 80-> 90 mJ Frequency: 10 Hz Impulse/cycles: 2300 Head size: Regular   Patient tolerated procedure well without immediate complications.         Clinical History: No specialty comments available.  She reports that she has never smoked. She has never used smokeless tobacco.  Recent Labs    08/01/21 1427  HGBA1C 5.6    Objective:   Vital Signs: There were no vitals taken for this visit.  Physical Exam  Gen: Well-appearing, in no acute distress; non-toxic CV: Regular Rate. Well-perfused. Warm.  Resp: Breathing unlabored on room air; no wheezing. Psych: Fluid speech in conversation; appropriate affect; normal thought  process Neuro: Sensation intact throughout. No gross coordination deficits.   Ortho Exam - Left leg: There are some tenderness over the lateral head of the gastrocnemius tendon.  No ecchymosis redness or gross swelling.  There is some nodularity within the soft tissue, possible hematoma palpable.  No pain behind the popliteal fossa of the knee.  *Bilateral calf circumference: 33 cm of bilateral legs  Imaging: No results found.  Past Medical/Family/Surgical/Social History: Medications & Allergies reviewed per EMR, new medications updated. 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/min (HCC) 08/09/2021   Tear film insufficiency 04/19/2021   Sjogren's disease ([place]) 01/24/2021   Erroneous encounter - disregard 01/21/2021   Abnormal gait 12/10/2020   Hereditary and idiopathic neuropathy, unspecified 12/10/2020   Major depressive disorder, single episode, unspecified 12/10/2020   Osteoarthritis 12/10/2020   Esophageal dysphagia 09/16/2020   Dehydration 09/16/2020   AKI (acute kidney injury) ([place]) 09/16/2020   Elevated lipase 09/16/2020   Hyperglycemia due to diabetes mellitus ([place]) 09/16/2020   Vitamin D deficiency 10/01/2019   Polypharmacy 10/01/2019   Major depressive disorder, recurrent episode, moderate ([place]) 10/01/2019   Leg edema 06/04/2019   Chest pain of uncertain etiology 03/54/6568   Hx of pulmonary embolus 03/30/2017   Physical exam 11/30/2016   Hyperlipidemia 12/26/2013   Encounter for therapeutic drug monitoring 04/16/2013   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   Long term (current) use of anticoagulants 06/30/2010   Exertional dyspnea 11/09/2008   Diabetes type 2, controlled ([place]) 08/27/2008   ANEMIA-NOS 08/27/2008   Seasonal and perennial allergic rhinitis 08/27/2008   PULMONARY NODULE 08/27/2008   Fibromyalgia 08/27/2008   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 ([place])  03/20/2007   Hypothyroidism 03/20/2007   Essential hypertension 03/20/2007   Recurrent pulmonary emboli ([place]) 03/20/2007   GERD 03/20/2007   ESOPHAGEAL STRICTURE 03/08/2007   Past Medical History:  Diagnosis Date   Anemia    Anxiety    Breast cancer ([place])    Depression    Diabetes mellitus type II    Fibromyalgia    GERD (gastroesophageal reflux disease)    Hyperlipidemia    Hypertension    Malignant neoplasm of breast (female), unspecified site 1993   L breast s/p mastectomy and tamoxifen x 32yr   Other pulmonary embolism and infarction 2008 and 2009   chronic anticoag - [facility]   Unspecified hypothyroidism    Family History  Problem Relation Age of Onset   Depression Other        Parent   Arthritis Other        Parent, Grandparent   Hypertension Other        Grandparent   Hyperlipidemia Other        [place]  Miscarriages /  Stillbirths Other        Grandparent   Stroke Other        [facility]   Cancer Maternal Uncle        prostate   Hypertension Maternal Grandfather    Breast cancer Cousin    Breast cancer Cousin    Past Surgical History:  Procedure Laterality Date   APPENDECTOMY     BALLOON DILATION N/A 10/18/2020   Procedure: BALLOON DILATION;  Surgeon: Eloise Harman, DO;  [facility];  Service: Endoscopy;  Laterality: N/A;   BIOPSY  10/18/2020   Procedure: BIOPSY;  Surgeon: Eloise Harman, DO;  Location: [facility];  Service: Endoscopy;;  gastric    BREAST BIOPSY Left 1993   BREAST BIOPSY Right 09/25/2014   stero. Benign   BREAST RECONSTRUCTION Left    CATARACT EXTRACTION     ESOPHAGOGASTRODUODENOSCOPY (EGD) WITH PROPOFOL N/A 10/18/2020   Procedure: ESOPHAGOGASTRODUODENOSCOPY (EGD) WITH PROPOFOL;  Surgeon: Eloise Harman, DO;  Location: [facility];  Service: Endoscopy;  Laterality: N/A;  11:00am   MASTECTOMY Left    ROTATOR CUFF REPAIR     SPINAL FUSION      x 2   TOTAL ABDOMINAL HYSTERECTOMY W/ BILATERAL SALPINGOOPHORECTOMY     TUBAL  LIGATION     Social History   Occupational History   Occupation: Armed forces technical officer: RETIRED   Occupation: Emergency planning/management officer   Occupation: school bus driver  Tobacco Use   Smoking status: Never   Smokeless tobacco: Never  Scientific laboratory technician Use: Never used  Substance and Sexual Activity   Alcohol use: No    Alcohol/week: 0.0 standard drinks of alcohol   Drug use: No   Sexual activity: Not Currently

## 2022-02-13 NOTE — Progress Notes (Signed)
Pain in left lower leg/ calf area Wants something for pain Saturday was really bad to the point she debated going to ER

## 2022-02-15 ENCOUNTER — Encounter: Payer: Self-pay | Admitting: Cardiology

## 2022-02-15 ENCOUNTER — Ambulatory Visit: Payer: Medicare PPO | Admitting: Cardiology

## 2022-02-15 ENCOUNTER — Telehealth: Payer: Self-pay

## 2022-02-15 VITALS — BP 133/61 | HR 58 | Resp 16 | Ht 64.0 in | Wt 144.0 lb

## 2022-02-15 DIAGNOSIS — I10 Essential (primary) hypertension: Secondary | ICD-10-CM

## 2022-02-15 NOTE — Telephone Encounter (Signed)
Patient's home BP has been elevated higher than historical trends this month. Patient had a recent fall and has been in pain. Her husband also severely injured his back recently and is currently in rehab.  Average Systolic BP Level 583.46 mmHg Lowest Systolic BP Level 219 mmHg Highest Systolic BP Level 471 mmHg  02/14/2022 Tuesday at 10:18 AM 172 / 79      02/13/2022 Monday at 07:54 AM 168 / 99      02/12/2022 'Sunday at 08:19 AM 167 / 93      02/11/2022 Saturday at 07:45 AM 178 / 96      02/10/2022 Friday at 07:31 AM 166 / 83      02/09/2022 Thursday at 08:47 AM 154 / 81      02/08/2022 Wednesday at 08:26 AM 167 / 89      02/07/2022 Tuesday at 07:23 AM 167 / 88      02/05/2022 Sunday at 08:29 AM 162 / 81      02/04/2022 Saturday at 12:03 PM 165 / 89      02/03/2022 Friday at 08:44 AM 173 / 87      11'$ /16/2023 Thursday at 07:50 AM 149 / 82

## 2022-02-15 NOTE — Progress Notes (Signed)
Follow up visit  Subjective:   Andrea Simmons, female    DOB: 1941-02-14, 81 y.o.   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 Blood pressure normal in the office today but has been uncontrolled at home, as detailed below. She is unsure how much hydralazine she takes.   Patient's home BP has been elevated higher than historical trends this month. Patient had a recent fall and has been in pain. Her husband also severely injured his back recently and is currently in rehab.   Average Systolic BP Level 063.49 mmHg Lowest Systolic BP Level 494 mmHg Highest Systolic BP Level 473 mmHg   02/14/2022 Tuesday at 10:18 AM      172 / 79                02/13/2022 Monday at 07:54 AM       168 / 99                02/12/2022 _0 9 / 82    Current Outpatient Medications:    ACCU-CHEK GUIDE test strip, CHECK BLOOD SUGARS DAILY, Disp: 100 strip, Rfl: 12   Accu-Chek Softclix Lancets lancets, USE AS INSTRUCTED TO TEST SUGARS TWICE A DAY, Disp: 100 each, Rfl: 12   amLODipine (NORVASC) 10 MG tablet,  TAKE 1 TABLET BY MOUTH EVERY DAY, Disp: 90 tablet, Rfl: 2   Biotin 1000 MCG tablet, Take 1,000 mcg by mouth daily., Disp: , Rfl:    Blood Glucose Monitoring Suppl (ACCU-CHEK GUIDE) w/Device KIT, 1 each by Does not apply route 2 (two) times daily. To test sugars.  Dx. E11.9, Disp: 1 kit, Rfl: 1   Colchicine (MITIGARE) 0.6 MG CAPS, Take 1 capsule by mouth 2 (two) times daily as needed., Disp: 60 capsule, Rfl: 3   DULoxetine (CYMBALTA) 30 MG capsule, Take 3 capsules (90 mg total) by mouth daily., Disp: 270 capsule, Rfl: 1   enoxaparin (LOVENOX) 60 MG/0.6ML injection, Inject 0.6 mLs (60 mg total) into the skin daily., Disp: 12 mL, Rfl: 0   eszopiclone (LUNESTA) 2 MG TABS tablet, TAKE 1 TABLET (2 MG TOTAL) BY MOUTH IMMEDIATELY BEFORE BEDTIME AS NEEDED FOR SLEEP, Disp: 30 tablet, Rfl: 3   fenofibrate (TRICOR) 48 MG tablet, Take 48 mg by mouth daily., Disp: , Rfl:    ferrous sulfate 325 (65 FE) MG tablet, TAKE 1 TABLET BY MOUTH EVERY DAY, Disp: 90 tablet, Rfl: 1   FLAREX 0.1 % ophthalmic suspension, Apply to eye., Disp: , Rfl:    furosemide (LASIX) 20 MG tablet, TAKE 1 TABLET BY MOUTH EVERY DAY AS NEEDED, Disp: 90 tablet, Rfl: 1   hydrALAZINE (APRESOLINE) 50 MG tablet, TAKE 1 TABLET BY MOUTH THREE TIMES A DAY, Disp: 90 tablet, Rfl: 3   HYDROcodone-acetaminophen (NORCO/VICODIN) 5-325 MG tablet, Take 1 tablet by mouth every 6 (six) hours as needed for moderate pain., Disp: 30 tablet, Rfl: 0   labetalol (NORMODYNE) 200 MG tablet, TAKE 1 TABLET BY MOUTH TWICE A DAY, Disp: 180 tablet, Rfl: 2   levothyroxine (SYNTHROID) 25 MCG tablet, TAKE 1 TABLET BY MOUTH EVERY DAY BEFORE BREAKFAST, Disp: 90 tablet, Rfl: 1   lidocaine (HM LIDOCAINE PATCH) 4 %, Place 1 patch onto the skin daily., Disp: 15 patch, Rfl: 1   linagliptin (TRADJENTA) 5 MG TABS tablet, Take 5 mg by mouth daily., Disp: , Rfl:    Magnesium 500 MG TABS, Take 500 mg by mouth daily., Disp: , Rfl:    nitroGLYCERIN (NITROSTAT) 0.4 MG SL tablet, Place 1  tablet (0.4 mg total) under the tongue every 5 (five) minutes as needed for chest pain., Disp: 30 tablet, Rfl: 3   pantoprazole (PROTONIX) 40 MG tablet, Take 1 tablet (40 mg total) by mouth 2 (two) times daily before a meal., Disp: 60 tablet, Rfl: 2   predniSONE (DELTASONE) 20 MG tablet, Take 1 tablet (20 mg total) by mouth daily with breakfast for 6 days., Disp: 6 tablet, Rfl: 0   RESTASIS MULTIDOSE 0.05 % ophthalmic emulsion, Place 1 drop into both eyes 2 (two) times daily., Disp: , Rfl: 6   rosuvastatin (CRESTOR) 20 MG tablet, Take 20 mg by mouth daily., Disp: , Rfl:    tiZANidine (ZANAFLEX) 4 MG tablet, Take 1 tablet (4 mg total) by mouth every 6 (six) hours as needed for muscle spasms., Disp: 40 tablet, Rfl: 0   valsartan (DIOVAN) 320 MG tablet, TAKE 1 TABLET BY MOUTH EVERY DAY, Disp: 90 tablet, Rfl: 2   Vitamin D, Ergocalciferol, (DRISDOL) 1.25 MG (50000 UNIT) CAPS capsule, TAKE 1 CAPSULE BY MOUTH ONE TIME PER WEEK, Disp: 12 capsule, Rfl: 0   warfarin (COUMADIN) 5 MG tablet, TAKE 1/2 TABLET (2.5 MG) BY MOUTH DAILY EXCEPT TAKE 1 TABLET (5 MG) ON MONDAYS, WEDNESDAYS AND SATURDAYS OR AS DIRECTED BY ANTICOAGULATION CLINIC, Disp: 75 tablet, Rfl: 2  Cardiovascular studies:  EKG 02/15/2022: Sinus rhythm 66 bpm with rate variation  RBBB, LAFB  Echocardiogram 01/11/2021:  Left ventricle cavity is normal in size. Moderate concentric hypertrophy  of the left ventricle. Normal global wall motion. Normal LV systolic  function with visual EF 55-60%. Doppler  evidence of grade I (impaired)  diastolic dysfunction, normal LAP.  Trace MR, trace TR.  Normal right atrial pressure.  Previous study in 2019 reported mild LA dilatation, mild MR, mild AI.  Lexiscan myoview stress test 01/30/2018: 1. The resting electrocardiogram demonstrated normal sinus rhythm, LAD, LAFB, RBBB and no resting arrhythmias.  The stress electrocardiogram was non-diagnostic due to pharmacologic stress. Stress symptoms included  dyspnea. Resting BP 166/86 and peak BP 202/88 mm Hg.  2. Myocardial perfusion imaging is normal. Overall left ventricular systolic function was normal without regional wall motion abnormalities. The left ventricular ejection fraction was 56%.  This is a low risk study.  Echocardiogram 01/28/2018:  Left ventricle cavity is normal in size. Moderate concentric hypertrophy of the left ventricle. Normal global wall motion. Doppler evidence of grade I (impaired) diastolic dysfunction, normal LAP. Calculated EF 55%. Left atrial cavity is mildly dilated. Mild (Grade I) aortic regurgitation. Mild (Grade I) mitral regrgitation. Trace tricuspid regurgitation. Inadequate tricuspid regurgitation jet to estimate pulmonary artery pressure. Normal right atrial pressure.  Recent labs: 08/01/2021: Glucose 101, BUN/Cr 39/1.92. EGFR 24. Na/K 137/4.5. AlKP 35. Rest of the CMP normal H/H 11/32.8. MCV 93. Platelets 225 HbA1C 5.6% Chol 178, TG 212, HDL 73, LDL 83 TSH 1.5 normal   Review of Systems  Constitutional: Positive for malaise/fatigue.       Difficulty sleeping   Cardiovascular:  Negative for chest pain, dyspnea on exertion, leg swelling, palpitations and syncope.  Musculoskeletal:  Positive for back pain.        Vitals:   02/15/22 1311  BP: 133/61  Pulse: (!) 58  Resp: 16  SpO2: 96%        Objective:   Physical Exam Vitals and nursing note reviewed.  Constitutional:      General: She is not in acute distress. Neck:     Vascular: No JVD.  Cardiovascular:     Rate and Rhythm: Normal rate and regular rhythm.     Pulses: Intact distal pulses.     Heart sounds: Normal heart sounds. No murmur heard.    Comments: Bilateral LE varcocities Pulmonary:     Effort: Pulmonary effort is normal.     Breath sounds: Normal breath sounds. No wheezing or rales.         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 She is unsure how much hydralazine she is taking. Based on her current dose, will likely need to double it.   F/u in 6 months.    Nigel Mormon, MD Pager: [phone] Office: [phone]

## 2022-02-16 ENCOUNTER — Ambulatory Visit (INDEPENDENT_AMBULATORY_CARE_PROVIDER_SITE_OTHER): Payer: Medicare PPO | Admitting: Family Medicine

## 2022-02-16 ENCOUNTER — Encounter: Payer: Self-pay | Admitting: Family Medicine

## 2022-02-16 VITALS — BP 128/60 | HR 105 | Temp 98.7°F | Resp 16 | Ht 64.0 in | Wt 144.0 lb

## 2022-02-16 DIAGNOSIS — E119 Type 2 diabetes mellitus without complications: Secondary | ICD-10-CM | POA: Diagnosis not present

## 2022-02-16 DIAGNOSIS — E559 Vitamin D deficiency, unspecified: Secondary | ICD-10-CM | POA: Diagnosis not present

## 2022-02-16 DIAGNOSIS — Z0000 Encounter for general adult medical examination without abnormal findings: Secondary | ICD-10-CM | POA: Diagnosis not present

## 2022-02-16 LAB — BASIC METABOLIC PANEL
BUN: 34 mg/dL — ABNORMAL HIGH (ref 6–23)
CO2: 24 mEq/L (ref 19–32)
Calcium: 9.5 mg/dL (ref 8.4–10.5)
Chloride: 106 mEq/L (ref 96–112)
Creatinine, Ser: 1.48 mg/dL — ABNORMAL HIGH (ref 0.40–1.20)
GFR: 33.4 mL/min — ABNORMAL LOW (ref >60.00)
Glucose, Bld: 118 mg/dL — ABNORMAL HIGH (ref 70–99)
Potassium: 4.7 mEq/L (ref 3.5–5.1)
Sodium: 137 mEq/L (ref 135–145)

## 2022-02-16 LAB — VITAMIN D 25 HYDROXY (VIT D DEFICIENCY, FRACTURES): VITD: 79.88 ng/mL (ref 30.00–100.00)

## 2022-02-16 LAB — MICROALBUMIN / CREATININE URINE RATIO
Creatinine,U: 67.9 mg/dL
Microalb Creat Ratio: 2.4 mg/g (ref 0.0–30.0)
Microalb, Ur: 1.6 mg/dL (ref 0.0–1.9)

## 2022-02-16 LAB — LIPID PANEL
Cholesterol: 148 mg/dL (ref 0–200)
HDL: 56.8 mg/dL (ref >39.00)
LDL Cholesterol: 70 mg/dL (ref 0–99)
NonHDL: 90.74
Total CHOL/HDL Ratio: 3
Triglycerides: 105 mg/dL (ref 0.0–149.0)
VLDL: 21 mg/dL (ref 0.0–40.0)

## 2022-02-16 LAB — CBC WITH DIFFERENTIAL/PLATELET
Basophils Absolute: 0 10*3/uL (ref 0.0–0.1)
Basophils Relative: 0.1 % (ref 0.0–3.0)
Eosinophils Absolute: 0 10*3/uL (ref 0.0–0.7)
Eosinophils Relative: 0 % (ref 0.0–5.0)
HCT: 32.8 % — ABNORMAL LOW (ref 36.0–46.0)
Hemoglobin: 11.6 g/dL — ABNORMAL LOW (ref 12.0–15.0)
Lymphocytes Relative: 12 % (ref 12.0–46.0)
Lymphs Abs: 1 10*3/uL (ref 0.7–4.0)
MCHC: 35.4 g/dL (ref 30.0–36.0)
MCV: 93.6 fl (ref 78.0–100.0)
Monocytes Absolute: 0.6 10*3/uL (ref 0.1–1.0)
Monocytes Relative: 7.2 % (ref 3.0–12.0)
Neutro Abs: 6.7 10*3/uL (ref 1.4–7.7)
Neutrophils Relative %: 80.7 % — ABNORMAL HIGH (ref 43.0–77.0)
Platelets: 212 10*3/uL (ref 150.0–400.0)
RBC: 3.51 Mil/uL — ABNORMAL LOW (ref 3.87–5.11)
RDW: 13.7 % (ref 11.5–15.5)
WBC: 8.4 10*3/uL (ref 4.0–10.5)

## 2022-02-16 LAB — HEPATIC FUNCTION PANEL
ALT: 26 U/L (ref 0–35)
AST: 29 U/L (ref 0–37)
Albumin: 4.6 g/dL (ref 3.5–5.2)
Alkaline Phosphatase: 32 U/L — ABNORMAL LOW (ref 39–117)
Bilirubin, Direct: 0.1 mg/dL (ref 0.0–0.3)
Total Bilirubin: 0.4 mg/dL (ref 0.2–1.2)
Total Protein: 7 g/dL (ref 6.0–8.3)

## 2022-02-16 LAB — HEMOGLOBIN A1C: Hgb A1c MFr Bld: 5.7 % (ref 4.6–6.5)

## 2022-02-16 LAB — TSH: TSH: 0.41 u[IU]/mL (ref 0.35–5.50)

## 2022-02-16 NOTE — Patient Instructions (Addendum)
Follow up in 6 months to recheck BP, cholesterol, and sugar We'll notify you of your lab results and make any changes if needed Keep up the good work on healthy diet and regular exercise- you look great! Make sure you are eating regularly and taking care of yourself Schedule your eye exam at your convenience Call with any questions or concerns Stay Safe!  Stay Healthy! Happy Holidays!!!

## 2022-02-16 NOTE — Assessment & Plan Note (Signed)
Pt's PE unchanged from previous.  UTD on mammo, PNA, flu.  Check labs.  Anticipatory guidance provided.

## 2022-02-16 NOTE — Progress Notes (Signed)
Subjective:    Patient ID: Andrea Simmons, female    DOB: 1941/02/05, 81 y.o.   MRN: [id number]  HPI CPE- UTD on mammo, PNA.  Due for eye exam (pt had to cancel due to husband's health issues), foot exam.  Patient Care Team    Relationship Specialty Notifications Start End  [name], MD PCP - General Family Medicine  10/12/21   Irene Shipper, MD Consulting Physician Gastroenterology  11/14/10   Deneise Lever, MD Consulting Physician Pulmonary Disease  11/14/10   Jacelyn Pi, MD Consulting Physician Endocrinology  11/14/10   Valinda Party, MD Consulting Physician Rheumatology  04/20/16   Purnell Shoemaker., MD Attending Physician Psychiatry  04/20/16   Renie Ora, MD Referring Physician Anesthesiology  04/20/16   Brunetta Genera, MD Consulting Physician Hematology  11/30/16     Health Maintenance  Topic Date Due   DTaP/Tdap/Td (2 - Tdap) 08/28/2018   Diabetic kidney evaluation - Urine ACR  01/10/2020   FOOT EXAM  01/24/2022   HEMOGLOBIN A1C  02/01/2022   OPHTHALMOLOGY EXAM  02/15/2022   Diabetic kidney evaluation - GFR measurement  08/02/2022   Medicare Annual Wellness (AWV)  11/17/2022   Pneumonia Vaccine 71+ Years old  Completed   INFLUENZA VACCINE  Completed   DEXA SCAN  Completed   Zoster Vaccines- Shingrix  Completed   HPV VACCINES  Aged Out   COVID-19 Vaccine  Discontinued   Hepatitis C Screening  Discontinued      Review of Systems Patient reports no vision/ hearing changes, adenopathy,fever,  persistant/recurrent hoarseness, chest pain, palpitations, edema, persistant/recurrent cough, hemoptysis, dyspnea (rest/exertional/paroxysmal nocturnal), gastrointestinal bleeding (melena, rectal bleeding), abdominal pain, significant heartburn, bowel changes, GU symptoms (dysuria, hematuria, incontinence), Gyn symptoms (abnormal  bleeding, pain),  syncope, focal weakness, memory loss, numbness & tingling, skin/hair/nail changes, abnormal bruising or bleeding.    +10 lb weight loss + dysphagia- s/p esophageal dilation    Objective:   Physical Exam General Appearance:    Alert, cooperative, no distress, appears stated age  Head:    Normocephalic, without obvious abnormality, atraumatic  Eyes:    PERRL, conjunctiva/corneas clear, EOM's intact both eyes  Ears:    Normal TM's and external ear canals, both ears  Nose:   Nares normal, septum midline, mucosa normal, no drainage    or sinus tenderness  Throat:   Lips, mucosa, and tongue normal; teeth and gums normal  Neck:   Supple, symmetrical, trachea midline, no adenopathy;    Thyroid: no enlargement/tenderness/nodules  Back:     Symmetric, no curvature, ROM normal, no CVA tenderness  Lungs:     Clear to auscultation bilaterally, respirations unlabored  Chest Wall:    No tenderness or deformity   Heart:    Regular rate and rhythm, S1 and S2 normal, no murmur, rub   or gallop  Breast Exam:    Deferred to mammo  Abdomen:     Soft, non-tender, bowel sounds active all four quadrants,    no masses, no organomegaly  Genitalia:    Deferred  Rectal:    Extremities:   Extremities normal, atraumatic, no cyanosis or edema  Pulses:   2+ and symmetric all extremities  Skin:   Skin color, texture, turgor normal, no rashes or lesions  Lymph nodes:   Cervical, supraclavicular, and axillary nodes normal  Neurologic:   CNII-XII intact, normal strength, sensation and reflexes    throughout  Assessment & Plan:

## 2022-02-16 NOTE — Assessment & Plan Note (Signed)
Check labs and replete prn. 

## 2022-02-16 NOTE — Assessment & Plan Note (Signed)
Chronic problem.  Sees Dr Chalmers Cater but isn't sure when her last appt was b/c she got off schedule w/ her husband's health emergencies.  Will check A1C and microalbumin.  Pt to schedule eye exam.

## 2022-02-16 NOTE — Telephone Encounter (Signed)
Patient called this morning and confirmed she has been taking hydralazine '50mg'$  1/2 tablet twice daily.

## 2022-02-17 ENCOUNTER — Telehealth: Payer: Self-pay

## 2022-02-17 NOTE — Telephone Encounter (Signed)
Called patient no answer, LM to call back for results

## 2022-02-17 NOTE — Telephone Encounter (Signed)
Lets make it 50 mg 1 tab bid, can be further increase to TID, if SBP remains >140 mmHg  Thanks MJP

## 2022-02-17 NOTE — Telephone Encounter (Signed)
-----   Message from [name], MD sent at 02/17/2022  7:58 AM EST ----- Labs are stable and look great!  Your kidney function is the best it's been in nearly a year- this is great news!  No med changes at this time

## 2022-02-17 NOTE — Telephone Encounter (Signed)
Patient is returning a phone call from [place] about her lab results.

## 2022-02-20 ENCOUNTER — Encounter: Payer: Self-pay | Admitting: Sports Medicine

## 2022-02-20 ENCOUNTER — Encounter: Payer: Self-pay | Admitting: *Deleted

## 2022-02-20 ENCOUNTER — Other Ambulatory Visit: Payer: Self-pay

## 2022-02-20 ENCOUNTER — Ambulatory Visit: Payer: Medicare PPO | Admitting: Sports Medicine

## 2022-02-20 ENCOUNTER — Telehealth: Payer: Self-pay | Admitting: *Deleted

## 2022-02-20 DIAGNOSIS — M47816 Spondylosis without myelopathy or radiculopathy, lumbar region: Secondary | ICD-10-CM

## 2022-02-20 DIAGNOSIS — M79662 Pain in left lower leg: Secondary | ICD-10-CM

## 2022-02-20 DIAGNOSIS — M25552 Pain in left hip: Secondary | ICD-10-CM | POA: Diagnosis not present

## 2022-02-20 DIAGNOSIS — E119 Type 2 diabetes mellitus without complications: Secondary | ICD-10-CM

## 2022-02-20 DIAGNOSIS — M1612 Unilateral primary osteoarthritis, left hip: Secondary | ICD-10-CM

## 2022-02-20 DIAGNOSIS — I10 Essential (primary) hypertension: Secondary | ICD-10-CM

## 2022-02-20 DIAGNOSIS — M797 Fibromyalgia: Secondary | ICD-10-CM

## 2022-02-20 MED ORDER — HYDRALAZINE HCL 50 MG PO TABS: 50.0000 mg | ORAL_TABLET | Freq: Two times a day (BID) | ORAL | 3 refills | Status: DC

## 2022-02-20 MED ORDER — TIZANIDINE HCL 4 MG PO TABS: 4.0000 mg | ORAL_TABLET | Freq: Every day | ORAL | 0 refills | Status: DC

## 2022-02-20 NOTE — Progress Notes (Signed)
Andrea Simmons - 81 y.o. female MRN [id number]  Date of birth: 1940/05/21  Office Visit Note: Visit Date: 02/20/2022 PCP: [name], MD Referred by: [name], MD  Subjective: Chief Complaint  Patient presents with   Left Leg - Pain   HPI: Andrea Simmons is a pleasant 81 y.o. female who presents today for follow-up of left leg pain and muscle cramping.  She is still experiencing rather significant pain in the hip, worse on the posterior side.  At times with certain movement this will catch and a sharp pain in the hip and down the leg.  She does get pain with first movements and then when she gets going it will loosen up slightly.  He is unsure if she would want to proceed with hip replacement surgery at this time given her husband's condition.  Also getting some pain in the calf, this waxes and wanes.  She did find some benefit from the shockwave treatment, states that her pain has been exacerbated she is unsure whether she could tolerate this today.  Times this will radiate down the leg over the lateral side of the calf and into the toes.  She denies any back pain at this time.  She does have some tightness in the leg and the back as well.  Previously she had been on tizanidine which helped with her muscle tightness/spasms and gave her some sleep.  She only received mild relief from the prednisone 20 mg x 6 days at last visit.  She does note she does feel her fibromyalgia has been flared up given the recent events and recent stressors regarding her husband's condition, she has not been sleeping well.  Pertinent ROS were reviewed with the patient and found to be negative unless otherwise specified above in HPI.   Assessment & Plan: Visit Diagnoses:  1. Pain of left calf   2. Unilateral primary osteoarthritis, left hip   3. Pain in left hip   4. Fibromyalgia   5. Osteoarthritis of lumbar spine, unspecified spinal osteoarthritis complication status    Plan: Discussed  with [facility] options for her hip and leg pain and muscle spasms.  I think her symptoms are multifactorial, she certainly has rather advanced arthritis of the left hip and this is irritable.  She did fall onto the left side to irritate the calf and left leg a few weeks ago.  She also has fibromyalgia and this has been flared up from that in the recent stressors of her husband undergoing surgery and living away from her.  I think all of these are contributing to her ongoing pain.  She also has the of the lumbar spine with previous anterior listhesis, however even though she has pain going down the left leg I feel this is more so from the hip.  She does have intermittent numbness and tingling, but her straight leg raise and back examination is unremarkable from her provocative maneuver today.  We will start her on tizanidine at nighttime only to help with the tightness and spasms and provide some sleep aid.  I would like her to see my partner, Dr. Ninfa Linden to see if he feels a hip replacement would be beneficial for her and if he feels this leg pain is coming more so from the hip or the back.  If he does not think this is coming from the hip, would likely have her see my partner, Dr. Laurance Flatten to evaluate for any possible radicular symptoms coming  from the back although I am less suspicious of this today.  Follow-up: F/u with Dr. Ninfa Linden to discuss possible THA or treatment options for left hip pain and OA   Meds & Orders:  Meds ordered this encounter  Medications   tiZANidine (ZANAFLEX) 4 MG tablet    Sig: Take 1 tablet (4 mg total) by mouth at bedtime.    Dispense:  30 tablet    Refill:  0   No orders of the defined types were placed in this encounter.    Procedures: No procedures performed      Clinical History: No specialty comments available.  She reports that she has never smoked. She has never used smokeless tobacco.  Recent Labs    08/01/21 1427 02/16/22 1313  HGBA1C 5.6 5.7     Objective:   Vital Signs: There were no vitals taken for this visit.  Physical Exam  Gen: Well-appearing, in no acute distress; non-toxic CV: Regular Rate. Well-perfused. Warm.  Resp: Breathing unlabored on room air; no wheezing. Psych: Fluid speech in conversation; appropriate affect; normal thought process Neuro: Sensation intact throughout. No gross coordination deficits.   Ortho Exam - Left hip: No swelling or redness.  She has limited internal rotation with pain, internal rotation of about 25 degrees compared to 40 degrees of the contralateral hip.  Positive and painful FADIR testing today.  Able to perform straight leg raise without any difficulty, no blocks to extension.   - Left leg/Lumbar: No TTP of the spinous process of the spine.  Negative straight leg raise.  Sensation to light touch intact throughout the anterior and posterior aspect of the leg.  Strength 5/5 in the L4-S1 nerve root distribution.  She has hyperreflexive bilateral patellas, 2/4 Achilles bilaterally.  Imaging:   IMPRESSION: 1. No acute fracture is identified. 2. Prominent degenerative hip arthropathy. 3. 5 mm of degenerative anterolisthesis of L4 on L5 with bilateral facet arthropathy at L4-5, and potential mild central narrowing of the thecal sac at this level. 4. Mild sigmoid colon diverticulosis. 5. Aortoiliac atherosclerotic vascular disease.   Aortic Atherosclerosis (ICD10-I70.0).     Electronically Signed   By: Van Clines M.D.   On: 01/27/2022 13:53  Past Medical/Family/Surgical/Social History: Medications & Allergies reviewed per EMR, new medications updated. 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/min (HCC) 08/09/2021   Tear film insufficiency 04/19/2021   Sjogren's disease ([place]) 01/24/2021   Erroneous encounter - disregard 01/21/2021   Abnormal gait 12/10/2020   Hereditary and idiopathic neuropathy, unspecified  12/10/2020   Major depressive disorder, single episode, unspecified 12/10/2020   Osteoarthritis 12/10/2020   Esophageal dysphagia 09/16/2020   Dehydration 09/16/2020   AKI (acute kidney injury) ([place]) 09/16/2020   Elevated lipase 09/16/2020   Hyperglycemia due to diabetes mellitus ([place]) 09/16/2020   Vitamin D deficiency 10/01/2019   Polypharmacy 10/01/2019   Major depressive disorder, recurrent episode, moderate ([place]) 10/01/2019   Leg edema 06/04/2019   Chest pain of uncertain etiology 62/13/0865   Hx of pulmonary embolus 03/30/2017   Physical exam 11/30/2016   Hyperlipidemia 12/26/2013   Encounter for therapeutic drug monitoring 04/16/2013   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   Long term (current) use of anticoagulants 06/30/2010   Exertional dyspnea 11/09/2008   Diabetes type 2, controlled ([place]) 08/27/2008   ANEMIA-NOS 08/27/2008   Seasonal and perennial allergic rhinitis 08/27/2008   PULMONARY NODULE 08/27/2008  Fibromyalgia 08/27/2008   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 ([place]) 03/20/2007   Hypothyroidism 03/20/2007   Essential hypertension 03/20/2007   Recurrent pulmonary emboli ([place]) 03/20/2007   GERD 03/20/2007   ESOPHAGEAL STRICTURE 03/08/2007   Past Medical History:  Diagnosis Date   Anemia    Anxiety    Breast cancer ([place])    Depression    Diabetes mellitus type II    Fibromyalgia    GERD (gastroesophageal reflux disease)    Hyperlipidemia    Hypertension    Malignant neoplasm of breast (female), unspecified site 1993   L breast s/p mastectomy and tamoxifen x 24yr   Other pulmonary embolism and infarction 2008 and 2009   chronic anticoag - [facility]   Unspecified hypothyroidism    Family History  Problem Relation Age of Onset   Depression Other        Parent   Arthritis Other        Parent, Grandparent   Hypertension Other        Grandparent   Hyperlipidemia Other        [place]  Miscarriages / Stillbirths Other         Grandparent   Stroke Other        [place]  Cancer Maternal Uncle        prostate   Hypertension Maternal Grandfather    Breast cancer Cousin    Breast cancer Cousin    Past Surgical History:  Procedure Laterality Date   APPENDECTOMY     BALLOON DILATION N/A 10/18/2020   Procedure: BALLOON DILATION;  Surgeon: CEloise Harman DO;  [facility];  Service: Endoscopy;  Laterality: N/A;   BIOPSY  10/18/2020   Procedure: BIOPSY;  Surgeon: CEloise Harman DO;  [facility];  Service: Endoscopy;;  gastric    BREAST BIOPSY Left 1993   BREAST BIOPSY Right 09/25/2014   stero. Benign   BREAST RECONSTRUCTION Left    CATARACT EXTRACTION     ESOPHAGOGASTRODUODENOSCOPY (EGD) WITH PROPOFOL N/A 10/18/2020   Procedure: ESOPHAGOGASTRODUODENOSCOPY (EGD) WITH PROPOFOL;  Surgeon: CEloise Harman DO;  [facility];  Service: Endoscopy;  Laterality: N/A;  11:00am   MASTECTOMY Left    ROTATOR CUFF REPAIR     SPINAL FUSION      x 2   TOTAL ABDOMINAL HYSTERECTOMY W/ BILATERAL SALPINGOOPHORECTOMY     TUBAL LIGATION     Social History   Occupational History   Occupation: tArmed forces technical officer RETIRED   Occupation: hEmergency planning/management officer  Occupation: school bus driver  Tobacco Use   Smoking status: Never   Smokeless tobacco: Never  VScientific laboratory technicianUse: Never used  Substance and Sexual Activity   Alcohol use: No    Alcohol/week: 0.0 standard drinks of alcohol   Drug use: No   Sexual activity: Not Currently

## 2022-02-20 NOTE — Patient Instructions (Signed)
Visit Information  Thank you for taking time to visit with me today. Please don't hesitate to contact me if I can be of assistance to you.   Following are the goals we discussed today:   Goals Addressed               This Visit's Progress     COMPLETED: Transportation resources (pt-stated)        Care Coordination Interventions: Provided education to patient and/or caregiver about advanced directives Reviewed medications with patient and discussed adherent with all medications with no needed refill Reviewed scheduled/upcoming provider appointments including sufficient transportation at this time but continue to use family/friends. Care Guide referral for another transportation source Screening for signs and symptoms of depression related to chronic disease state  Assessed social determinant of health barriers          Please call the care guide team at [phone] if you need to cancel or reschedule your appointment.   If you are experiencing a Mental Health or [place] or need someone to talk to, please call the Suicide and Crisis Lifeline: 988 call the [place] National Suicide Prevention Lifeline: 929-667-3613 or TTY: 740 054 9920 TTY 873-437-9415) to talk to a trained counselor call 1-800-273-TALK (toll free, 24 hour hotline)  Patient verbalizes understanding of instructions and care plan provided today and agrees to view in [place]. Active MyChart status and patient understanding of how to access instructions and care plan via MyChart confirmed with patient.     No further follow up required: No needs at this time.  Raina Mina, RN Care Management Coordinator [place] Office [phone]

## 2022-02-20 NOTE — Patient Outreach (Signed)
  Care Coordination   Initial Visit Note   02/20/2022 Name: Andrea Simmons MRN: [id number] DOB: 09-13-1940  Andrea Simmons is a 81 y.o. year old female who sees Tabori, Aundra Millet, MD for primary care. I spoke with  Steward Drone by phone today.  What matters to the patients health and wellness today?  Transportation resources    Goals Addressed               This Visit's Progress     COMPLETED: Transportation resources (pt-stated)        Care Coordination Interventions: Provided education to patient and/or caregiver about advanced directives Reviewed medications with patient and discussed adherent with all medications with no needed refill Reviewed scheduled/upcoming provider appointments including sufficient transportation at this time but continue to use family/friends. Care Guide referral for another transportation source Screening for signs and symptoms of depression related to chronic disease state  Assessed social determinant of health barriers          SDOH assessments and interventions completed:  Yes  SDOH Interventions Today    Flowsheet Row Most Recent Value  SDOH Interventions   Food Insecurity Interventions Intervention Not Indicated  Housing Interventions Intervention Not Indicated  Transportation Interventions Other (Comment)  [Request transportation in the area for future transportation sources]  Utilities Interventions Intervention Not Indicated        Care Coordination Interventions:  Yes, provided   Follow up plan: No further intervention required.   Encounter Outcome:  Pt. Visit Completed   Raina Mina, RN Care Management Coordinator [place] Office [phone]

## 2022-02-20 NOTE — Progress Notes (Signed)
Patient previously taking Hydralazine '50mg'$  1/2 tab BID.  Per discussion with Dr. Virgina Jock - patient to increase to hydralazine '50mg'$  1 tab BID due to elevated home BP. Patient is aware of change.  02/20/2022 Monday at 07:45 AM 161 / 89      02/20/2022 Monday at 07:45 AM 161 / 89      02/19/2022 'Sunday at 07:41 AM 176 / 92      02/18/2022 Saturday at 08:02 AM 167 / 85      02/17/2022 Friday at 06:50 AM 166 / 84      02/16/2022 Thursday at 09:02 AM 165 / 88      02/15/2022 Wednesday at 09:08 AM 151 / 86      11'$ /28/2023 Tuesday at 10:18 AM 172 / 79

## 2022-02-20 NOTE — Progress Notes (Signed)
Not doing any better

## 2022-02-20 NOTE — Telephone Encounter (Signed)
LM again to call back for results

## 2022-02-21 ENCOUNTER — Telehealth: Payer: Self-pay | Admitting: *Deleted

## 2022-02-21 NOTE — Telephone Encounter (Signed)
Called patient and did inform her, notes she is doing well and would also like a copy of results sent to her, printed and put out via [employer]

## 2022-02-21 NOTE — Telephone Encounter (Signed)
   Telephone encounter was:  Successful.  02/21/2022 Name: Andrea Simmons MRN: [id number] DOB: 09/20/1940  Andrea Simmons is a 81 y.o. year old female who is a primary care patient of Birdie Riddle, Aundra Millet, MD . The community resource team was consulted for assistance with Transportation Needs   Care guide performed the following interventions: Patient provided with information about care guide support team and interviewed to confirm resource needs. Provided contact with [facility] and also provide the [place] line  Follow Up Plan:  No further follow up planned at this time. The patient has been provided with needed resources. [facility] [phone] [address] , [place] [address] Email : [email or web address]

## 2022-02-22 DIAGNOSIS — I10 Essential (primary) hypertension: Secondary | ICD-10-CM | POA: Diagnosis not present

## 2022-03-06 ENCOUNTER — Encounter: Payer: Self-pay | Admitting: Sports Medicine

## 2022-03-06 ENCOUNTER — Ambulatory Visit: Payer: Medicare PPO | Admitting: Sports Medicine

## 2022-03-06 DIAGNOSIS — M1612 Unilateral primary osteoarthritis, left hip: Secondary | ICD-10-CM

## 2022-03-06 DIAGNOSIS — M79662 Pain in left lower leg: Secondary | ICD-10-CM

## 2022-03-06 DIAGNOSIS — M5416 Radiculopathy, lumbar region: Secondary | ICD-10-CM | POA: Diagnosis not present

## 2022-03-06 DIAGNOSIS — M47816 Spondylosis without myelopathy or radiculopathy, lumbar region: Secondary | ICD-10-CM

## 2022-03-06 NOTE — Progress Notes (Signed)
Andrea Simmons - 81 y.o. female MRN [id number]  Date of birth: 1940-05-03  Office Visit Note: Visit Date: 03/06/2022 PCP: [name], MD Referred by: [name], MD  Subjective: Chief Complaint  Patient presents with   Left Leg - Pain   HPI: Andrea Simmons is a pleasant 81 y.o. female who presents today for left leg pain, right low back pain, radicular symptoms.  Patient has had an exacerbation of her left calf pain.  We did ultrasound this in the past with evidence of a grade 1-2 lateral gastrocnemius strain.  Shockwave therapy previously once in the past which gave provide benefit, interested in doing this again.  She also continues with pain about the left hip as well as pain that shoots down the left posterior thigh.  I do not think this is more so coming from the advanced arthritis of the hip.  Has not yet been able to see one of my orthopedic colleagues.  Discussion on hip replacement.  He over the last few weeks she has noticed the pain over the right lower back, this does radiate down the leg at times.  Is any weakness or giving way.  Her pain is worse with lumbar extension, it worsened a sharp jolt down the lower back and posterior leg.  She has been taking some tizanidine as needed at bedtime, mildly helpful.  Pertinent ROS were reviewed with the patient and found to be negative unless otherwise specified above in HPI.   Assessment & Plan: Visit Diagnoses:  1. Lumbar back pain with radiculopathy affecting left lower extremity   2. Osteoarthritis of lumbar spine, unspecified spinal osteoarthritis complication status   3. Unilateral primary osteoarthritis, left hip   4. Pain of left calf    Plan: I had a good discussion with Andrea Simmons and her daughter today regarding her ailments.  We did perform shockwave therapy to her left calf today, she did tolerate this well and did have improvement following this.  She has had a flare of her low back pain which  previously imaging has shown degenerative anterior listhesis of L4 on L5 as well as bilateral facet arthropathy.  Given her pain and radicular symptoms I would like to proceed with MRI of the lumbar spine to better evaluate this pathology.  I would also like her to see my partner, Dr. Zollie Beckers, she evaluate her left hip and discuss possible THA or other treatment options.  She is rather tender with a trigger point over the right low back/SI joint region, we may consider trigger point injection in this area at a follow-up visit depending on MRI results.  She will continue tizanidine 4 mg nightly as needed.  We will follow-up in 2 weeks if she found the shockwave therapy helpful for the calf, otherwise we will follow-up after MRI to discuss next steps.  Follow-up: F/u in 2 weeks for repeat ECST if desires; if not f/u after MRI to discuss next steps   Meds & Orders: No orders of the defined types were placed in this encounter.   Orders Placed This Encounter  Procedures   MR Lumbar Spine w/o contrast     Procedures: Procedure: ECSWT Indications:  calf strain, calf pain   Procedure Details Consent: Risks of procedure as well as the alternatives and risks of each were explained to the patient.  Verbal consent for procedure obtained. Time Out: Verified patient identification, verified procedure, site was marked, verified correct patient position. The area was  cleaned with alcohol swab.     The left proximal gastrocnemius was targeted for Extracorporeal shockwave therapy.    Preset: status post muscular injury Power Level: 100 mJ Frequency: 10 -> 12 Hz Impulse/cycles: 2500 Head size: Regular   Patient tolerated procedure well without immediate complications.         Clinical History: No specialty comments available.  She reports that she has never smoked. She has never used smokeless tobacco.  Recent Labs    08/01/21 1427 02/16/22 1313  HGBA1C 5.6 5.7    Objective:   Vital  Signs: There were no vitals taken for this visit.  Physical Exam  Gen: Well-appearing, in no acute distress; non-toxic CV: Regular Rate. Well-perfused. Warm.  Resp: Breathing unlabored on room air; no wheezing. Psych: Fluid speech in conversation; appropriate affect; normal thought process Neuro: Sensation intact throughout. No gross coordination deficits.   Ortho Exam - Lumbar spine: There are some mild TTP surrounding the paraspinal musculature at the L4-L5 region.  There is pain with extension.  Positive straight leg raise on the right.  There is 5/5 strength bilateral lower extremities in the L4-S1 nerve root distribution. + Tender point/trigger point around the right SI joint region.  - Left leg: There is limited rotation internally about the hip.  Positive FADIR testing.  Positive TTP over the lateral gastrocnemius tendon.  No significant swelling or ecchymosis.  Imaging:  Narrative & Impression  CLINICAL DATA:  Left hip pain.  Two falls in the last 3 weeks.   EXAM: CT OF THE LEFT HIP WITHOUT CONTRAST   TECHNIQUE: Multidetector CT imaging of the left hip was performed according to the standard protocol. Multiplanar CT image reconstructions were also generated.   RADIATION DOSE REDUCTION: This exam was performed according to the departmental dose-optimization program which includes automated exposure control, adjustment of the mA and/or kV according to patient size and/or use of iterative reconstruction technique.   COMPARISON:  Radiographs 01/24/2022   FINDINGS: Bones/Joint/Cartilage   Prominent degenerative hip arthropathy with prominent spurring of the acetabulum, femoral head, and femoral neck. Small well corticated ossicle anteromedial to the left femoral neck probably from a chronically fragmented osteophyte. Other chronically fragmented osteophytes are present along the anterior superior and posteroinferior acetabulum. Prominent degenerative chondral  thinning along portions of the hip joint.   No acute fracture is observed.   5 mm of degenerative anterolisthesis of L4 on L5 bilateral facet arthropathy at L4-5, and potential mild central narrowing of the thecal sac at this level. No fracture of the left hemipelvis is observed.   Ligaments   Suboptimally assessed by CT.   Muscles and Tendons   Unremarkable   Soft tissues   Aortoiliac atherosclerotic vascular disease. There are a few proximal sigmoid colon diverticula.   IMPRESSION: 1. No acute fracture is identified. 2. Prominent degenerative hip arthropathy. 3. 5 mm of degenerative anterolisthesis of L4 on L5 with bilateral facet arthropathy at L4-5, and potential mild central narrowing of the thecal sac at this level. 4. Mild sigmoid colon diverticulosis. 5. Aortoiliac atherosclerotic vascular disease.   Aortic Atherosclerosis (ICD10-I70.0).     Electronically Signed   By: Van Clines M.D.   On: 01/27/2022 13:53    Past Medical/Family/Surgical/Social History: Medications & Allergies reviewed per EMR, new medications updated. 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/min ([place]) 08/09/2021   Tear film insufficiency 04/19/2021   Sjogren's disease ([place]) 01/24/2021  Erroneous encounter - disregard 01/21/2021   Abnormal gait 12/10/2020   Hereditary and idiopathic neuropathy, unspecified 12/10/2020   Major depressive disorder, single episode, unspecified 12/10/2020   Osteoarthritis 12/10/2020   Esophageal dysphagia 09/16/2020   Dehydration 09/16/2020   AKI (acute kidney injury) ([place]) 09/16/2020   Elevated lipase 09/16/2020   Hyperglycemia due to diabetes mellitus ([place]) 09/16/2020   Vitamin D deficiency 10/01/2019   Polypharmacy 10/01/2019   Major depressive disorder, recurrent episode, moderate ([place]) 10/01/2019   Leg edema 06/04/2019   Chest pain of uncertain etiology 37/12/6267   Hx of  pulmonary embolus 03/30/2017   Physical exam 11/30/2016   Hyperlipidemia 12/26/2013   Encounter for therapeutic drug monitoring 04/16/2013   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   Long term (current) use of anticoagulants 06/30/2010   Exertional dyspnea 11/09/2008   Diabetes type 2, controlled ([place]) 08/27/2008   ANEMIA-NOS 08/27/2008   Seasonal and perennial allergic rhinitis 08/27/2008   PULMONARY NODULE 08/27/2008   Fibromyalgia 08/27/2008   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 ([place]) 03/20/2007   Hypothyroidism 03/20/2007   Essential hypertension 03/20/2007   Recurrent pulmonary emboli ([place]) 03/20/2007   GERD 03/20/2007   ESOPHAGEAL STRICTURE 03/08/2007   Past Medical History:  Diagnosis Date   Anemia    Anxiety    Breast cancer ([place])    Depression    Diabetes mellitus type II    Fibromyalgia    GERD (gastroesophageal reflux disease)    Hyperlipidemia    Hypertension    Malignant neoplasm of breast (female), unspecified site 1993   L breast s/p mastectomy and tamoxifen x 8yr   Other pulmonary embolism and infarction 2008 and 2009   chronic anticoag - [facility]   Unspecified hypothyroidism    Family History  Problem Relation Age of Onset   Depression Other        Parent   Arthritis Other        Parent, Grandparent   Hypertension Other        Grandparent   Hyperlipidemia Other        [place]  Miscarriages / Stillbirths Other        Grandparent   Stroke Other        [place]  Cancer Maternal Uncle        prostate   Hypertension Maternal Grandfather    Breast cancer Cousin    Breast cancer Cousin    Past Surgical History:  Procedure Laterality Date   APPENDECTOMY     BALLOON DILATION N/A 10/18/2020   Procedure: BALLOON DILATION;  Surgeon: CEloise Harman DO;  [facility];  Service: Endoscopy;  Laterality: N/A;   BIOPSY  10/18/2020   Procedure: BIOPSY;  Surgeon: CEloise Harman DO;  [facility];  Service:  Endoscopy;;  gastric    BREAST BIOPSY Left 1993   BREAST BIOPSY Right 09/25/2014   stero. Benign   BREAST RECONSTRUCTION Left    CATARACT EXTRACTION     ESOPHAGOGASTRODUODENOSCOPY (EGD) WITH PROPOFOL N/A 10/18/2020   Procedure: ESOPHAGOGASTRODUODENOSCOPY (EGD) WITH PROPOFOL;  Surgeon: CEloise Harman DO;  Location[facility];  Service: Endoscopy;  Laterality: N/A;  11:00am   MASTECTOMY Left    ROTATOR CUFF REPAIR     SPINAL FUSION      x 2   TOTAL ABDOMINAL HYSTERECTOMY W/ BILATERAL SALPINGOOPHORECTOMY     TUBAL LIGATION     Social History   Occupational History   Occupation:  teacher's assistant    Employer: RETIRED   Occupation: Emergency planning/management officer   Occupation: school bus driver  Tobacco Use   Smoking status: Never   Smokeless tobacco: Never  Scientific laboratory technician Use: Never used  Substance and Sexual Activity   Alcohol use: No    Alcohol/week: 0.0 standard drinks of alcohol   Drug use: No   Sexual activity: Not Currently   I spent 37 minutes in the care of the patient today including face-to-face time, preparation to see the patient, as well as review of previous imaging from the back and hip; counseling and educating the patient and her daughter today on shockwave, home exercises vs. Formal PT options, ordering of lumbar MRI, general overview of possible surgery to be performed by one of my orthopedic colleagues for her advanced left hip osteoarthritis and other discussion for the above diagnoses.   Elba Barman, DO Primary Care Sports Medicine Physician  [place]  This note was dictated using Dragon naturally speaking software and may contain errors in syntax, spelling, or content which have not been identified prior to signing this note.

## 2022-03-12 ENCOUNTER — Other Ambulatory Visit: Payer: Self-pay | Admitting: Psychiatry

## 2022-03-12 DIAGNOSIS — F4001 Agoraphobia with panic disorder: Secondary | ICD-10-CM

## 2022-03-12 DIAGNOSIS — F411 Generalized anxiety disorder: Secondary | ICD-10-CM

## 2022-03-12 DIAGNOSIS — F331 Major depressive disorder, recurrent, moderate: Secondary | ICD-10-CM

## 2022-03-12 DIAGNOSIS — F338 Other recurrent depressive disorders: Secondary | ICD-10-CM

## 2022-03-14 ENCOUNTER — Telehealth: Payer: Self-pay | Admitting: Orthopedic Surgery

## 2022-03-14 ENCOUNTER — Other Ambulatory Visit: Payer: Self-pay | Admitting: Sports Medicine

## 2022-03-14 DIAGNOSIS — M25552 Pain in left hip: Secondary | ICD-10-CM

## 2022-03-14 MED ORDER — TRAMADOL HCL 50 MG PO TABS: 50.0000 mg | ORAL_TABLET | Freq: Four times a day (QID) | ORAL | 0 refills | Status: AC | PRN

## 2022-03-15 NOTE — Telephone Encounter (Signed)
Patient called. She has been seeing Dr. Rolena Infante. She had gotten good relief with prior treatments but states she has been having a lot of pain for the last couple of days especially in her hips. No fevers/chills. Has been able to ambulate. No trauma. Prescribed her a short course of tramadol to help with the pain until she can figure out next steps in treatment with Dr. Rolena Infante.

## 2022-03-16 ENCOUNTER — Telehealth: Payer: Self-pay | Admitting: Sports Medicine

## 2022-03-16 ENCOUNTER — Telehealth: Payer: Self-pay | Admitting: Orthopedic Surgery

## 2022-03-16 ENCOUNTER — Other Ambulatory Visit: Payer: Self-pay | Admitting: Orthopaedic Surgery

## 2022-03-16 ENCOUNTER — Encounter: Payer: Self-pay | Admitting: Sports Medicine

## 2022-03-16 MED ORDER — ACETAMINOPHEN-CODEINE 300-30 MG PO TABS: 1.0000 | ORAL_TABLET | Freq: Three times a day (TID) | ORAL | 0 refills | Status: DC | PRN

## 2022-03-16 NOTE — Telephone Encounter (Signed)
Pt called stating she need a strong medication then tramadol. She states it's not helping at all. Pt is waiting for MRI and has an upcoming appt with Dr Ninfa Linden. She states the pain is unbearable. Please send to [facility]. Pt phone number is [phone].

## 2022-03-16 NOTE — Telephone Encounter (Signed)
Patient states that her hip is bothering her, please call patient..[phone]

## 2022-03-17 NOTE — Telephone Encounter (Signed)
Spoke with patient. She spoke with Dr.Moore yesterday and was given Tramadol to take for pain. She is doing better today. She will take the tramadol as needed until her appt with Dr.Blackman on 03/29/22.

## 2022-03-21 ENCOUNTER — Telehealth: Payer: Self-pay

## 2022-03-21 MED ORDER — HYDROCODONE-ACETAMINOPHEN 5-325 MG PO TABS: 1.0000 | ORAL_TABLET | Freq: Four times a day (QID) | ORAL | 0 refills | Status: DC | PRN

## 2022-03-21 NOTE — Telephone Encounter (Signed)
Not 100% sure why,(maybe Rolena Infante out) but [place] sent in Tylenol#3 for patient last week. [facility] states they do not have in stock and want something different sent in, unless I guess the patient would like to try a different pharmacy

## 2022-03-21 NOTE — Addendum Note (Signed)
Addended by: Renne Musca III on: 03/21/2022 04:41 PM   Modules accepted: Orders

## 2022-03-22 ENCOUNTER — Ambulatory Visit: Payer: Medicare PPO

## 2022-03-25 DIAGNOSIS — I10 Essential (primary) hypertension: Secondary | ICD-10-CM | POA: Diagnosis not present

## 2022-03-28 ENCOUNTER — Other Ambulatory Visit: Payer: Medicare PPO

## 2022-03-29 ENCOUNTER — Ambulatory Visit (INDEPENDENT_AMBULATORY_CARE_PROVIDER_SITE_OTHER): Payer: Medicare PPO

## 2022-03-29 ENCOUNTER — Ambulatory Visit: Payer: Medicare PPO | Admitting: Orthopaedic Surgery

## 2022-03-29 DIAGNOSIS — Z7901 Long term (current) use of anticoagulants: Secondary | ICD-10-CM

## 2022-03-29 LAB — POCT INR: INR: 2.7 (ref 2.0–3.0)

## 2022-03-29 NOTE — Progress Notes (Signed)
Continue 1/2 tablet daily except take 1 tablet on Mondays, Wednesdays and Saturdays. Recheck in 6 weeks.

## 2022-03-29 NOTE — Patient Instructions (Addendum)
Pre visit review using our clinic review tool, if applicable. No additional management support is needed unless otherwise documented below in the visit note.  Continue 1/2 tablet daily except take 1 tablet on Mondays, Wednesdays and Saturdays. Recheck in 6 weeks.

## 2022-03-30 ENCOUNTER — Encounter: Payer: Self-pay | Admitting: Psychiatry

## 2022-03-30 ENCOUNTER — Ambulatory Visit (INDEPENDENT_AMBULATORY_CARE_PROVIDER_SITE_OTHER): Payer: Medicare PPO | Admitting: Psychiatry

## 2022-03-30 DIAGNOSIS — G4721 Circadian rhythm sleep disorder, delayed sleep phase type: Secondary | ICD-10-CM | POA: Diagnosis not present

## 2022-03-30 DIAGNOSIS — F5105 Insomnia due to other mental disorder: Secondary | ICD-10-CM

## 2022-03-30 DIAGNOSIS — F331 Major depressive disorder, recurrent, moderate: Secondary | ICD-10-CM

## 2022-03-30 DIAGNOSIS — R7989 Other specified abnormal findings of blood chemistry: Secondary | ICD-10-CM

## 2022-03-30 DIAGNOSIS — F411 Generalized anxiety disorder: Secondary | ICD-10-CM | POA: Diagnosis not present

## 2022-03-30 DIAGNOSIS — F4001 Agoraphobia with panic disorder: Secondary | ICD-10-CM | POA: Diagnosis not present

## 2022-03-30 DIAGNOSIS — F338 Other recurrent depressive disorders: Secondary | ICD-10-CM

## 2022-03-30 NOTE — Progress Notes (Signed)
Andrea Simmons [id number] [birth year] 82 y.o.  Subjective:   Patient ID:  Andrea Simmons is a 82 y.o. (DOB Aug 14, 1940) female.  Chief Complaint:  Chief Complaint  Patient presents with   Follow-up   Depression   Anxiety   Sleeping Problem    Depression        Associated symptoms include fatigue and myalgias.  Associated symptoms include no decreased concentration, no appetite change and no suicidal ideas.  Past medical history includes anxiety.   Anxiety Symptoms include nervous/anxious behavior. Patient reports no confusion, decreased concentration, palpitations or suicidal ideas.      Andrea Simmons presents  today for recent exacerbation of depression..    At visit was July 09, 2018.  Vraylar was discontinued for lack of response.  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  This was partially helpful.  When  seen Aug 15, 2018 and Ritalin was increased to 15 mg twice daily because of partial response at the lower dose.  At visit and of July 2020.  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  The Ritalin was increased again to 20 mg twice daily.  seen December 04, 2018 & 02/2019.  Doesn't like winter.  Seasonal depression and crying more.  There were no med changes.   11/17/19 appt with the following noted: BP been higher and seeing Card and having med changes. Stopped Vit D bc level was high. Some discussion about the Ritalin over the BP control. Some increase depression in part over health issues.  No marital problems.  No SI.  Issues with daughter are a stressor.  D said she was mean when D was growing up and was too strict.  Felt D was ungrateful and that she and H did the best they could do.  I can't get over that and has told D about this. Ritalin with less energizing benefit and less productive than she was..  Wears off around  supper time.  At night cannot break habit of snacks at night. Does not feel it kick in and wear off.  Back to old habits of staying up too late, now MN.  Needs 7-8 hours.  Always needed more than others.    I feel good taking it.  Most days feels pretty good.  Can have crying spells without reason even before the Ritalin.   Doing much more than I was.   H still sick often too. Plan: Reduce Ritalin to 10 mg twice daily due to loss of benefit and blood pressure Start change from duloxetine to Trintellix to help depression: Reduce duloxetine to 3 of 30 mg capsules and start Trintellix 5 mg daily for 1 week, Then reduce duloxetine to 2 of the 30 mg capsules and increase Trintellix to 10 mg daily for 1 week, Then increase Trintellix to 10 + 5 mg tablets and reduce duloxetine to 1 of the 30 mg capsules for 1 week, Then stop Duloxetine and increase Trintellix to 20 mg daily (or 2 of the 10 mg tablets)  01/14/2020 appointment with the following noted: Is on Trintellix 20 mg daily since 12/08/19 approximately. Was really hard with the switch.  Got really low and now some better.  Had a lot of aches and fatigue for awhile for 5 weeks.  Wonders if she was sick.  She doesn't feel like Trintellix is  the right med. Gained 14# and still on low dose Ritalin.  Still having crying spells and poor energy but some days feels pretty good. No nausea or other SE noticed. A lot of worry over her health and BP and H's cellulitis again. Plan: Overall patient has not improved with switch from duloxetine to Trintellix.  Her energy is not better and her mood is not better.  She is also quite anxious. Reduce Trintellix to 10 mg 1 daily and start viibryd 10 mg daily for a week, then  Stop Trintellix and increase Viibryd to 20 mg daily with food.  Andrea Simmons 01/23/20 wanting to stop Ritalin so she did.  01/30/20 TC : LM:  Suggested she restart vitamin D 2000 units daily.  Her vitamin D level is a little low and we would like to see it  in the 50s.  It is hoped that dose will push back into the 50s and if there is a question we will recheck it in 3 months. She has not been on Viibryd 20 mg long enough to see the full benefit of that.  She needs to continue Viibryd 20 mg for at least a full 4 weeks at that dosage so another 3 weeks or so.  At that point we could consider increasing it if needed.   03/10/20 appt with following noted: Ritalin didn't help and might have increased BP per card. On Viibryd about 6 weeks at 20 mg. Read on viibryd  And concerned about taking with coumadin. Gotten where she can't sleep well.  Last week only averaging 3 hours per night.  Also irritable a lot in last 10 days. Gained 17#. Hungry. Worries about things more than in the past and some of it is age. Andrea Simmons and Monday diarrhea.  Otherwise just at times. Plan: DC Ritalin per her request. Poor response so far with Viibryd. Increase Viibryd to 30 mg for 1 week, then 40 mg daily.  04/28/2020 appt with following noted: A lot better but far from where I need to be. Still depressed. Can't sleep.  Catnap.  Taking viibryd in morning 40 mg for 5 weeks. Increase Viibryd to 40 mg daily.   3-4 hours sleep and no naps.  Not drowsy daytime. Ongoing tiredness chronically. Feels more tense and irritable from not sleeping.  Plan: continue Viibryd 40  06/17/2020 appt with following noted: vIIBRYD not helping. Trazodone 25 mg hallucinated bugs and screamed. H not doing well and that hasn't helped mood. Body aches and hurts all over. Asks about return to Cymbalta. Still gaining weight. Broken sleep with total of 6-8 hours Plan: CO poor effect with Viibryd and duloxetine did help FM more and pain. Wean Viibryd and restart duloxetine and increase to 90 mg daily (*took 120 mg before) Reduce Viibryd to 20 mg daily and start 1 of the duloxetine 30 mg daily for 5 days, Then continue Viibryd 20 mg daily and increase duloxetine to 2 of the 30 mg daily for 5  days, Then reduce Viibryd to 10 mg daily and increase duloxetine to 3 of the 30 mg daily for 5 days, Then stop Viibryd and continue duloxetine 3 daily. Rec trial mirtazapine 7.5 mg HS.  08/12/2020 appointment with the following noted: I'm doing better with Cymbalta 90. "much, much better" Mirtazapine didn't work for [employer] but is sleeping now.  Laid down at MN and to sleep 2 A and up 845. Arthritis is bad and dx gout.  Joints in hands are bad.  Anxiety manageable.  Depression got really bad before the switch in meds.  Pleased with result.  Seasonal depression starts in fall and until spring. Busy and worked with flowers. Pretty good now. Plan no med changes.  Continue duloxetine 90 mg daily and mirtazapine for sleep  12/15/2020 appointment with the following noted: I'm doing really good.  Cymbalta is a success.  She's not worried about winter.   Ardyth Gal has not been well with cellulitis.  She's been through health problems.  Handles stressors well.   CO still poor sleep even with mirtazapine.  Will have 2-3 nights ok and other nights EFA.  Wants to sleep 11-8.  Seems she can't get relaxed and will worry at night.  Some nights pain interferes. Plan: Better with switch back to duloxetine 90 mg daily (*took 120 mg before) Okay trial of Lunesta 2 mg nightly with mirtazapine 7.5 mg nightly for chronic insomnia.  03/16/2021 appointment with the following noted: "Wonderful" cp to last year was depressed.  Pleased with duloxetine. May go 2-3 nights with poor sleep per week. Hangover if use Lunesta 2 with mirtazapine 7.5.  So stopped mirtazapine.  Andrea Simmons works sometime but no SE with it. D positive for Covid and didn't visit for Xmas. Plan: DC mirtazapine 7.5 mg HS DT poor response and hangover Belsomra samples given  09/14/21 appt noted: Belsomra NR and NM and none now. Can be startled when she awakens at times and did with Belsomra. Trouble getting to sleep then awakens with pain. Some  nights almost no sleep Energy better with change in BP meds. Couple of falls since here. No depression despite health issues. Plan: Continue duloxetine 90 mg daily and Lunesta 2 mg nightly as needed insomnia  03/30/22 appt noted: Unfortunately [place] fell and broke back and hasn't been able to come home yet.  Been at 3 different places.  He's in good spirits.  At [facility] living. He's 82 yo.  Was active until he fell at home.  He can't walk and is incontinet.  His mind is clear. She's surprised at how well she has held up with the stress.  No sig seasonal depression so far.   Never been a person that sleeps well at night. Stress.    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   Past Psychiatric Medication Trials: Rozerem, zolpidem,  Trazodone severe SE, mirtazapine 7.5 hangover, Lunesta 2  Belsomra NM  Duloxetine 120, Paxil 20 for years, Sertraline, fluoxetine, Wellbutrin,   Trintellix 20 5 weeks NR.  Viibryd 40  NR,  Low dose nortriptyline,  Abilify weight gain and loss benefit, Was More talkative on the Abilify and the Wellbutrin.  lithium 2004 SE tremor at '600mg'$  daily.   Vraylar 40 NR, Buspar NR.  Review of Systems:  Review of Systems  Constitutional:  Positive for fatigue. Negative for appetite change and unexpected weight change.  HENT:  Negative for sinus pain.   Cardiovascular:  Negative for palpitations.  Gastrointestinal:  Negative for diarrhea.  Musculoskeletal:  Positive for arthralgias, back pain, joint swelling and myalgias. Negative for neck stiffness.  Neurological:  Negative for tremors and numbness.  Psychiatric/Behavioral:  Positive for dysphoric mood. Negative for agitation, behavioral problems, confusion, decreased concentration, hallucinations, self-injury, sleep disturbance and suicidal ideas. The patient is nervous/anxious. The patient is not hyperactive.    Hx anemia.    Cardiologist said she had a stiff heart.   History of pulmonary embolism and on Coumadin.  Medications: I have  reviewed the patient's current medications.  Current Outpatient Medications  Medication Sig Dispense Refill   ACCU-CHEK GUIDE test strip CHECK BLOOD SUGARS DAILY 100 strip 12   Accu-Chek Softclix Lancets lancets USE AS INSTRUCTED TO TEST SUGARS TWICE A DAY 100 each 12   amLODipine (NORVASC) 10 MG tablet TAKE 1 TABLET BY MOUTH EVERY DAY 90 tablet 2   Biotin 1000 MCG tablet Take 1,000 mcg by mouth daily.     Blood Glucose Monitoring Suppl (ACCU-CHEK GUIDE) w/Device KIT 1 each by Does not apply route 2 (two) times daily. To test sugars. Dx. E11.9 1 kit 1   Colchicine (MITIGARE) 0.6 MG CAPS Take 1 capsule by mouth 2 (two) times daily as needed. 60 capsule 3   DULoxetine (CYMBALTA) 30 MG capsule TAKE 3 CAPSULES BY MOUTH EVERY DAY 270 capsule 0   enoxaparin (LOVENOX) 60 MG/0.6ML injection Inject 0.6 mLs (60 mg total) into the skin daily. 12 mL 0   eszopiclone (LUNESTA) 2 MG TABS tablet TAKE 1 TABLET (2 MG TOTAL) BY MOUTH IMMEDIATELY BEFORE BEDTIME AS NEEDED FOR SLEEP 30 tablet 3   fenofibrate (TRICOR) 48 MG tablet Take 48 mg by mouth daily.     ferrous sulfate 325 (65 FE) MG tablet TAKE 1 TABLET BY MOUTH EVERY DAY 90 tablet 1   FLAREX 0.1 % ophthalmic suspension Apply to eye.     furosemide (LASIX) 20 MG tablet TAKE 1 TABLET BY MOUTH EVERY DAY AS NEEDED 90 tablet 1   hydrALAZINE (APRESOLINE) 50 MG tablet Take 1 tablet (50 mg total) by mouth 2 (two) times daily. 90 tablet 3   HYDROcodone-acetaminophen (NORCO/VICODIN) 5-325 MG tablet Take 1-2 tablets by mouth every 6 (six) hours as needed for moderate pain or severe pain. 20 tablet 0   labetalol (NORMODYNE) 200 MG tablet TAKE 1 TABLET BY MOUTH TWICE A DAY 180 tablet 2   levothyroxine (SYNTHROID) 25 MCG tablet TAKE 1 TABLET BY MOUTH EVERY DAY BEFORE BREAKFAST 90 tablet 1   lidocaine (HM LIDOCAINE PATCH) 4 % Place 1 patch onto the skin daily. 15 patch 1   linagliptin (TRADJENTA) 5 MG  TABS tablet Take 5 mg by mouth daily.     Magnesium 500 MG TABS Take 500 mg by mouth daily.     RESTASIS MULTIDOSE 0.05 % ophthalmic emulsion Place 1 drop into both eyes 2 (two) times daily.  6   rosuvastatin (CRESTOR) 20 MG tablet Take 20 mg by mouth daily.     tiZANidine (ZANAFLEX) 4 MG tablet Take 1 tablet (4 mg total) by mouth at bedtime. 30 tablet 0   valsartan (DIOVAN) 320 MG tablet TAKE 1 TABLET BY MOUTH EVERY DAY 90 tablet 2   Vitamin D, Ergocalciferol, (DRISDOL) 1.25 MG (50000 UNIT) CAPS capsule TAKE 1 CAPSULE BY MOUTH ONE TIME PER WEEK 12 capsule 0   warfarin (COUMADIN) 5 MG tablet TAKE 1/2 TABLET (2.5 MG) BY MOUTH DAILY EXCEPT TAKE 1 TABLET (5 MG) ON MONDAYS, WEDNESDAYS AND SATURDAYS OR AS DIRECTED BY ANTICOAGULATION CLINIC 75 tablet 2   nitroGLYCERIN (NITROSTAT) 0.4 MG SL tablet Place 1 tablet (0.4 mg total) under the tongue every 5 (five) minutes as needed for chest pain. 30 tablet 3   pantoprazole (PROTONIX) 40 MG tablet Take 1 tablet (40 mg total) by mouth 2 (two) times daily before a meal. 60 tablet 2   No current facility-administered medications for this visit.    Medication Side Effects: None talkative more the M[facility]  Allergies:  Allergies  Allergen  Reactions   Lipitor [Atorvastatin]     Unknown   Morphine Nausea And Vomiting   Meperidine Nausea And Vomiting   Naproxen Sodium Nausea And Vomiting    Past Medical History:  Diagnosis Date   Anemia    Anxiety    Breast cancer ([place])    Depression    Diabetes mellitus type II    Fibromyalgia    GERD (gastroesophageal reflux disease)    Hyperlipidemia    Hypertension    Malignant neoplasm of breast (female), unspecified site 1993   L breast s/p mastectomy and tamoxifen x 17yr   Other pulmonary embolism and infarction 2008 and 2009   chronic anticoag - [facility]   Unspecified hypothyroidism     Family History  Problem Relation Age of Onset   Depression Other        Parent   Arthritis Other        Parent,  Grandparent   Hypertension Other        Grandparent   Hyperlipidemia Other        FBath  Miscarriages / Stillbirths Other        Grandparent   Stroke Other        [place]  Cancer Maternal Uncle        prostate   Hypertension Maternal Grandfather    Breast cancer Cousin    Breast cancer Cousin     Social History   Socioeconomic History   Marital status: Married    Spouse name: Not on file   Number of children: 1   Years of education: Not on file   Highest education level: Not on file  Occupational History   Occupation: tArmed forces technical officer RETIRED   Occupation: hEmergency planning/management officer  Occupation: school bus driver  Tobacco Use   Smoking status: Never   Smokeless tobacco: Never  VScientific laboratory technicianUse: Never used  Substance and Sexual Activity   Alcohol use: No    Alcohol/week: 0.0 standard drinks of alcohol   Drug use: No   Sexual activity: Not Currently  Other Topics Concern   Not on file  Social History Narrative   Not on file   Social Determinants of Health   Financial Resource Strain: Low Risk  (11/16/2021)   Overall Financial Resource Strain (CARDIA)    Difficulty of Paying Living Expenses: Not hard at all  Food Insecurity: No Food Insecurity (02/20/2022)   Hunger Vital Sign    Worried About Running Out of Food in the Last Year: Never true    Ran Out of Food in the Last Year: Never true  Transportation Needs: No Transportation Needs (02/20/2022)   PRAPARE - THydrologist(Medical): No    Lack of Transportation (Non-Medical): No  Physical Activity: Insufficiently Active (11/16/2021)   Exercise Vital Sign    Days of Exercise per Week: 3 days    Minutes of Exercise per Session: 30 min  Stress: No Stress Concern Present (11/16/2021)   [place]   Feeling of Stress : Not at all  Social Connections: Moderately Isolated (11/16/2021)   Social Connection and Isolation  Panel [NHANES]    Frequency of Communication with Friends and Family: More than three times a week    Frequency of Social Gatherings with Friends and Family: More than three times a week    Attends Religious Services: Never    Active  Member of Clubs or Organizations: No    Attends Archivist Meetings: Never    Marital Status: Married  Human resources officer Violence: Not At Risk (11/16/2021)   Humiliation, Afraid, Rape, and Kick questionnaire    Fear of Current or Ex-Partner: No    Emotionally Abused: No    Physically Abused: No    Sexually Abused: No    Past Medical History, Surgical history, Social history, and Family history were reviewed and updated as appropriate.   Please see review of systems for further details on the patient's review from today.   Objective:   Physical Exam:  There were no vitals taken for this visit.  Physical Exam Constitutional:      General: She is not in acute distress.    Appearance: She is well-developed.  Musculoskeletal:        General: No deformity.  Neurological:     Mental Status: She is alert and oriented to person, place, and time.     Cranial Nerves: No dysarthria.     Coordination: Coordination normal.  Psychiatric:        Attention and Perception: Attention normal. She is attentive.        Mood and Affect: Mood is anxious. Mood is not depressed. Affect is not labile, blunt, angry, tearful or inappropriate.        Speech: Speech normal. Speech is not rapid and pressured.        Behavior: Behavior normal. Behavior is cooperative.        Thought Content: Thought content normal. Thought content is not paranoid or delusional. Thought content does not include homicidal or suicidal ideation. Thought content does not include suicidal plan.        Cognition and Memory: Cognition and memory normal.        Judgment: Judgment normal.     Comments: Insight fair-good.   Depression is OK so far.   Talkative chronically     Lab Review:      Component Value Date/Time   NA 137 02/16/2022 1313   NA 139 12/27/2020 1237   NA 141 08/01/2016 1527   K 4.7 02/16/2022 1313   K 4.6 08/01/2016 1527   CL 106 02/16/2022 1313   CO2 24 02/16/2022 1313   CO2 24 08/01/2016 1527   GLUCOSE 118 (H) 02/16/2022 1313   GLUCOSE 93 08/01/2016 1527   BUN 34 (H) 02/16/2022 1313   BUN 30 (H) 12/27/2020 1237   BUN 19.9 08/01/2016 1527   CREATININE 1.48 (H) 02/16/2022 1313   CREATININE 0.9 08/01/2016 1527   CALCIUM 9.5 02/16/2022 1313   CALCIUM 9.7 08/01/2016 1527   PROT 7.0 02/16/2022 1313   PROT 7.2 08/01/2016 1527   PROT 6.8 08/01/2016 1527   ALBUMIN 4.6 02/16/2022 1313   ALBUMIN 4.0 08/01/2016 1527   AST 29 02/16/2022 1313   AST 41 (H) 08/01/2016 1527   ALT 26 02/16/2022 1313   ALT 25 08/01/2016 1527   ALKPHOS 32 (L) 02/16/2022 1313   ALKPHOS 48 08/01/2016 1527   BILITOT 0.4 02/16/2022 1313   BILITOT 0.33 08/01/2016 1527   GFRNONAA 37 (L) 03/28/2021 0054   GFRAA 76 05/28/2019 1609       Component Value Date/Time   WBC 8.4 02/16/2022 1313   RBC 3.51 (L) 02/16/2022 1313   HGB 11.6 (L) 02/16/2022 1313   HGB 9.5 (L) 08/01/2016 1527   HCT 32.8 (L) 02/16/2022 1313   HCT 31.2 (L) 08/01/2016 1527   PLT 212.0 02/16/2022  1313   PLT 221 08/01/2016 1527   MCV 93.6 02/16/2022 1313   MCV 83.9 08/01/2016 1527   MCH 31.4 03/28/2021 0054   MCHC 35.4 02/16/2022 1313   RDW 13.7 02/16/2022 1313   RDW 17.2 (H) 08/01/2016 1527   LYMPHSABS 1.0 02/16/2022 1313   LYMPHSABS 2.0 08/01/2016 1527   MONOABS 0.6 02/16/2022 1313   MONOABS 0.4 08/01/2016 1527   EOSABS 0.0 02/16/2022 1313   EOSABS 0.1 08/01/2016 1527   BASOSABS 0.0 02/16/2022 1313   BASOSABS 0.0 08/01/2016 1527    No results found for: "POCLITH", "LITHIUM"   No results found for: "PHENYTOIN", "PHENOBARB", "VALPROATE", "CBMZ"   Endo checked thyroid lately.   Vitamin D level on April 01, 2018 reported as 40.  After that vitamin D 50,000 units weekly was prescribed with a goal  of achieving levels in the 50s and 60s.  December 04, 2018 vitamin D 25 off supplement .res Assessment: Plan:    Jadia was seen today for follow-up, depression, anxiety and sleeping problem.  Diagnoses and all orders for this visit:  Major depressive disorder, recurrent episode, moderate (HCC)  Seasonal depression ([place])  Panic disorder with agoraphobia  Generalized anxiety disorder  Insomnia due to mental condition  Delayed sleep phase syndrome  Low vitamin D level     Greater than 50% of  30 min face to face time with patient was spent on counseling and coordination of care. Hx.TRD better again with duloxetine again.  We discussed options for dealing with this. If needed consider lamotrigine.  Pt hx mixed sx so consider unconventional alternatives.  Discussed potential benefits, risks, and side effects of stimulants with patient to include increased heart rate, palpitations, insomnia, increased anxiety, increased irritability, or decreased appetite.  Instructed patient to contact office if experiencing any significant tolerability issues. She does not appear manic and not sig different from the last visit. Still talkative on it but not manic.  Better with switch back to duloxetine 90 mg daily (*took 120 mg before)  Educated that she cannot expect to sleep solid through the night.  Explained.   Also disc sleep hygiene.   She's frustrated with sleep.  Trial of Bz bc worry is part of it but risk SE and risk tolerance.  Disc fall risk with sleep meds.  Have tried the safest meds. Disc timing of Lunesta but she insists on taking it early instead. Cont prn Lunesta 2 mg HS DT age.  Prefer empty stomach. She has chronic insomnia. Still not sleeping very well.  Supportive therapy dealing with duloxetine.  Repeat vitamin D level was 47.  Previously high.  Restarted 2000 units daily.      FU 6 mos   Andrea Parents, MD, DFAPA   Please see After Visit Summary for patient  specific instructions.  Future Appointments  Date Time Provider [place]  04/01/2022  6:30 PM [facility] MR 2 [id number] [facility] [address] [facility]  04/19/2022  1:45 PM Mcarthur Rossetti, MD [facility] None  05/10/2022  2:00 PM [facility] COUMADIN [facility]  08/16/2022  1:00 PM [name], Reynold Bowen, MD [facility] None  11/23/2022  2:00 PM [facility] HEALTH COACH [facility]    No orders of the defined types were placed in this encounter.      -------------------------------

## 2022-04-01 ENCOUNTER — Ambulatory Visit
Admission: RE | Admit: 2022-04-01 | Discharge: 2022-04-01 | Disposition: A | Discharge status: Home / Self Care | Payer: Medicare PPO | Source: Ambulatory Visit | Attending: Sports Medicine | Admitting: Sports Medicine

## 2022-04-01 DIAGNOSIS — M5127 Other intervertebral disc displacement, lumbosacral region: Secondary | ICD-10-CM | POA: Diagnosis not present

## 2022-04-01 DIAGNOSIS — M5416 Radiculopathy, lumbar region: Secondary | ICD-10-CM

## 2022-04-01 DIAGNOSIS — M4316 Spondylolisthesis, lumbar region: Secondary | ICD-10-CM | POA: Diagnosis not present

## 2022-04-01 DIAGNOSIS — M48061 Spinal stenosis, lumbar region without neurogenic claudication: Secondary | ICD-10-CM | POA: Diagnosis not present

## 2022-04-03 ENCOUNTER — Other Ambulatory Visit: Payer: Self-pay | Admitting: Psychiatry

## 2022-04-03 DIAGNOSIS — F5105 Insomnia due to other mental disorder: Secondary | ICD-10-CM

## 2022-04-03 DIAGNOSIS — G4721 Circadian rhythm sleep disorder, delayed sleep phase type: Secondary | ICD-10-CM

## 2022-04-09 ENCOUNTER — Other Ambulatory Visit: Payer: Self-pay | Admitting: Cardiology

## 2022-04-09 DIAGNOSIS — I10 Essential (primary) hypertension: Secondary | ICD-10-CM

## 2022-04-17 ENCOUNTER — Other Ambulatory Visit: Payer: Self-pay | Admitting: Cardiology

## 2022-04-17 ENCOUNTER — Other Ambulatory Visit: Payer: Self-pay | Admitting: Family Medicine

## 2022-04-17 DIAGNOSIS — R600 Localized edema: Secondary | ICD-10-CM

## 2022-04-19 ENCOUNTER — Encounter: Payer: Self-pay | Admitting: Orthopaedic Surgery

## 2022-04-19 ENCOUNTER — Ambulatory Visit: Payer: Self-pay

## 2022-04-19 ENCOUNTER — Ambulatory Visit: Payer: Medicare PPO | Admitting: Orthopaedic Surgery

## 2022-04-19 DIAGNOSIS — M25552 Pain in left hip: Secondary | ICD-10-CM | POA: Diagnosis not present

## 2022-04-19 DIAGNOSIS — M25512 Pain in left shoulder: Secondary | ICD-10-CM

## 2022-04-19 MED ORDER — LIDOCAINE HCL 1 % IJ SOLN
3.0000 mL | INTRAMUSCULAR | Status: AC | PRN
  Administered 2022-04-19: 3 mL

## 2022-04-19 MED ORDER — METHYLPREDNISOLONE ACETATE 40 MG/ML IJ SUSP
40.0000 mg | INTRAMUSCULAR | Status: AC | PRN
  Administered 2022-04-19: 40 mg via INTRA_ARTICULAR

## 2022-04-19 NOTE — Progress Notes (Signed)
The patient comes in with 2 issues today.  She has chronic left shoulder pain and chronic left hip pain.  She has had plain films of her hip as well as her shoulder and a CT scan of her hips and she has had falls making sure she did not have a fracture.  She does not walk with assistive ice.  She is 82 years old.  She is on Coumadin and says she cannot take anti-inflammatories.  She is really not issued any type of surgical intervention but she was told she may need a hip replacement.  She did state that she is having worst shoulder pain and she has hip pain in terms of left shoulder.  She had a mechanical fall several months ago and that necessitated the CT scan of her left hip to rule out a fracture.  There was only some mild arthritic changes.  She says she may have injured her left shoulder in that fall but x-rays were negative of the left shoulder.  On exam her shoulder I can really not get a good exam of her left shoulder because of pain out of portion of exam.  Her x-rays of her shoulder from just a few months ago show a normal appearing shoulder.  She says she cannot lift the shoulder well but I see no evidence of muscle atrophy and I did have her abduct her shoulder without using her rotator cuff.  She is a diabetic but her last hemoglobin A1c was 5.7.  On exam her left hip I can internally and externally rotate the hip with only pain on the lateral x-ray of the hip and not in the groin.  I did review the plain films of the CT scan that show her hips and pelvis.  Both hips have some mild arthritic changes but not severe.  I did watch her ambulate and she walks without a limp.  She points to the lateral aspect of her hip as a source of her pain.  I did place a steroid injection in her left shoulder subacromial outlet.  I would like to see her back in just 2 weeks to see how she is done from that injection and to consider a trochanteric injection possibly.  Right now, I do not think we need to address  her hip in terms of replacement of her left hip.  That may change though.      Procedure Note  Patient: Andrea Simmons             Date of Birth: 07-27-1940           MRN: [id number]             Visit Date: 04/19/2022  Procedures: Visit Diagnoses:  1. Acute pain of left shoulder     Large Joint Inj: L subacromial bursa on 04/19/2022 2:06 PM Indications: pain and diagnostic evaluation Details: 22 G 1.5 in needle  Arthrogram: No  Medications: 3 mL lidocaine 1 %; 40 mg methylPREDNISolone acetate 40 MG/ML Outcome: tolerated well, no immediate complications Procedure, treatment alternatives, risks and benefits explained, specific risks discussed. Consent was given by the patient. Immediately prior to procedure a time out was called to verify the correct patient, procedure, equipment, support staff and site/side marked as required. Patient was prepped and draped in the usual sterile fashion.

## 2022-04-20 DIAGNOSIS — H0288B Meibomian gland dysfunction left eye, upper and lower eyelids: Secondary | ICD-10-CM | POA: Diagnosis not present

## 2022-04-20 DIAGNOSIS — H0288A Meibomian gland dysfunction right eye, upper and lower eyelids: Secondary | ICD-10-CM | POA: Diagnosis not present

## 2022-04-20 DIAGNOSIS — H16143 Punctate keratitis, bilateral: Secondary | ICD-10-CM | POA: Diagnosis not present

## 2022-04-20 DIAGNOSIS — M3501 Sicca syndrome with keratoconjunctivitis: Secondary | ICD-10-CM | POA: Diagnosis not present

## 2022-04-23 ENCOUNTER — Encounter: Payer: Self-pay | Admitting: Orthopaedic Surgery

## 2022-04-24 ENCOUNTER — Telehealth: Payer: Self-pay | Admitting: Sports Medicine

## 2022-04-24 ENCOUNTER — Other Ambulatory Visit: Payer: Self-pay | Admitting: Sports Medicine

## 2022-04-24 DIAGNOSIS — M25551 Pain in right hip: Secondary | ICD-10-CM

## 2022-04-24 DIAGNOSIS — M5416 Radiculopathy, lumbar region: Secondary | ICD-10-CM

## 2022-04-24 NOTE — Telephone Encounter (Signed)
Patient states her leg woke her up and she is having pain, she wants to speak to [facility]. Please call patient

## 2022-04-24 NOTE — Progress Notes (Signed)
MRI was reviewed. My assistant, Lilia Pro, called patient today. Still having some issues with L-leg radicular pain. Discussed seeing my partner Dr. Ernestina Patches for consideration of lumbar ESI at L4-L5 level. She is agreeable. If no improvement with left leg radicular symptoms after 2 weeks, I will see her back in office.   DB

## 2022-04-24 NOTE — Telephone Encounter (Signed)
Spoke with patient Put referral in to see Dr. Ernestina Patches per Dr. Rolena Infante

## 2022-04-25 ENCOUNTER — Other Ambulatory Visit: Payer: Self-pay | Admitting: Family Medicine

## 2022-04-25 ENCOUNTER — Telehealth: Payer: Self-pay | Admitting: Sports Medicine

## 2022-04-25 ENCOUNTER — Other Ambulatory Visit: Payer: Self-pay | Admitting: Psychiatry

## 2022-04-25 ENCOUNTER — Other Ambulatory Visit: Payer: Self-pay | Admitting: Cardiology

## 2022-04-25 DIAGNOSIS — F4001 Agoraphobia with panic disorder: Secondary | ICD-10-CM

## 2022-04-25 DIAGNOSIS — F338 Other recurrent depressive disorders: Secondary | ICD-10-CM

## 2022-04-25 DIAGNOSIS — F411 Generalized anxiety disorder: Secondary | ICD-10-CM

## 2022-04-25 DIAGNOSIS — I10 Essential (primary) hypertension: Secondary | ICD-10-CM | POA: Diagnosis not present

## 2022-04-25 DIAGNOSIS — F331 Major depressive disorder, recurrent, moderate: Secondary | ICD-10-CM

## 2022-04-25 NOTE — Telephone Encounter (Signed)
Patient called asked if Andrea Simmons will call her concerning the pain she is experiencing and getting a Rx for pain. Patient asked if she can get something a little stronger because she can not sleep at night. The number to contact patient is [phone]

## 2022-04-27 ENCOUNTER — Telehealth: Payer: Self-pay | Admitting: Sports Medicine

## 2022-04-27 NOTE — Telephone Encounter (Signed)
Patient states she needs to speak to [facility] h. ASAP. Please call

## 2022-04-27 NOTE — Telephone Encounter (Signed)
Called and spoke to patient. She wants to know what her MRI stated; and why she needs injection with [place]. We made her an appointment for tomorrow with Dr. Rolena Infante in hopes to answer all of this for her.

## 2022-04-28 ENCOUNTER — Ambulatory Visit: Payer: Medicare PPO | Admitting: Sports Medicine

## 2022-04-28 ENCOUNTER — Encounter: Payer: Self-pay | Admitting: Sports Medicine

## 2022-04-28 DIAGNOSIS — M797 Fibromyalgia: Secondary | ICD-10-CM

## 2022-04-28 DIAGNOSIS — F329 Major depressive disorder, single episode, unspecified: Secondary | ICD-10-CM | POA: Diagnosis not present

## 2022-04-28 DIAGNOSIS — M5416 Radiculopathy, lumbar region: Secondary | ICD-10-CM

## 2022-04-28 DIAGNOSIS — M25512 Pain in left shoulder: Secondary | ICD-10-CM | POA: Diagnosis not present

## 2022-04-28 DIAGNOSIS — M439 Deforming dorsopathy, unspecified: Secondary | ICD-10-CM

## 2022-04-28 MED ORDER — HYDROCODONE-ACETAMINOPHEN 5-325 MG PO TABS: 1.0000 | ORAL_TABLET | Freq: Four times a day (QID) | ORAL | 0 refills | Status: DC | PRN

## 2022-04-28 NOTE — Progress Notes (Signed)
Andrea Simmons - 82 y.o. female MRN [id number]  Date of birth: 1941-02-22  Office Visit Note: Visit Date: 04/28/2022 PCP: [name], MD Referred by: [name], MD  Subjective: Chief Complaint  Patient presents with   Lower Back - Pain   HPI: Andrea Simmons is a pleasant 82 y.o. female who presents today for follow-up of low back pain with left radiculopathy.  She wanted to review her MRI scan or area to proceed with lumbar spine injection.  Continues with low back pain that will radiate down the buttock and the lateral side of the knee and anterior calf.  She feels some of the pain over the medial side of the ankle as well.   She discusses with me today that she is feeling stressed and certainly depressed.  She has not been sleeping the last few nights because of the pain as well as difficulty with her husband, Ardyth Gal, at the skilled nursing facility.  She has had a hard time with this.   Still having some pain in the left shoulder, he had a few days relief from the injection Dr. Ninfa Linden gave her, but still giving her issues.  Planned for left Transforaminal L4 ESI with Dr. Ernestina Patches next week.  Pertinent ROS were reviewed with the patient and found to be negative unless otherwise specified above in HPI.   Assessment & Plan: Visit Diagnoses:  1. Lumbar back pain with radiculopathy affecting left lower extremity   2. Fibromyalgia   3. Major depressive disorder with single episode, remission status unspecified    Plan: I discussed with [name] and did review her MRI with her in the room today.  She has spondylosis noted throughout the spine with an anterior listhesis of L4 on L5 with bilateral L4 and L5 foraminal narrowing.  Her symptoms have been radicular always on the left, more so in the L4 and L5 dermatome.  We did discuss all treatment options, I do think it is pertinent she tries the spine injection with Dr. Ernestina Patches, I think this would relieve her radicular  leg pain.  She does have a age-indeterminate compression deformity with a Schmorl's node at T11, however she is not tender in this region upon palpation.  She is dealing with quite a lot with her husband given his condition, and she tells me today she is certainly overwhelmed.  I recommend she follow-up with her primary care physician and her psychiatrist.  If at any point she would be able to live in assisted living or independent living at the same facility, [facility], to be closer to her husband I do think this would be greatly beneficial for her.  In terms of her shoulder, if this continues we may consider an ultrasound-guided glenohumeral joint injection as opposed to the subacromial injection that Dr. Ninfa Linden gave her at last visit.  Follow-up: Scheduled ESI next week    Meds & Orders:  Meds ordered this encounter  Medications   HYDROcodone-acetaminophen (NORCO/VICODIN) 5-325 MG tablet    Sig: Take 1-2 tablets by mouth every 6 (six) hours as needed for severe pain.    Dispense:  10 tablet    Refill:  0   No orders of the defined types were placed in this encounter.    Procedures: No procedures performed      Clinical History: MRI LUMBAR SPINE WITHOUT CONTRAST   TECHNIQUE: Multiplanar, multisequence MR imaging of the lumbar spine was performed. No intravenous contrast was administered.   COMPARISON:  Prior study from 02/25/2019.   FINDINGS: Segmentation: Standard. Lowest well-formed disc space labeled the L5-S1 level.   Alignment: 6 mm facet mediated anterolisthesis of L4 on L5. Trace 2 mm retrolisthesis of T12 on L1 and L1 and L2.   Vertebrae: Compression deformity of the superior endplate of 624THL with superimposed endplate Schmorl's node deformity, partially visualized. There is mild marrow edema at the anterior aspect of the partially visualized superior endplate, which could reflect a superimposed acute to subacute component (series 4, image 8). No retropulsion.  Vertebral body height otherwise maintained with no other acute or chronic fracture. Bone marrow signal intensity within normal limits. No worrisome osseous lesions. No other abnormal marrow edema.   Conus medullaris and cauda equina: Conus extends to the T12 level. Conus and cauda equina appear normal.   Paraspinal and other soft tissues: Unremarkable.   Disc levels:   T12-L1: Retrolisthesis with mild disc bulge and endplate spurring. Mild facet hypertrophy. No canal or foraminal stenosis.   L1-2: Retrolisthesis with mild disc bulge and endplate spurring. Mild facet hypertrophy. No spinal stenosis. Foramina remain patent.   L2-3: Disc desiccation with minimal disc bulge. Mild to moderate facet and ligament flavum hypertrophy. No significant spinal stenosis. Foramina remain patent.   L3-4: Mild degenerative intervertebral disc space narrowing. Disc desiccation with mild disc bulge. Moderate bilateral facet hypertrophy. No significant spinal stenosis. Foramina remain patent.   L4-5: 6 mm facet mediated anterolisthesis. Disc desiccation with associated broad posterior pseudo disc bulge/uncovering. Moderate to advanced bilateral facet arthrosis. Resultant moderate to severe canal with bilateral subarticular stenosis, with mild bilateral L4 foraminal narrowing.   L5-S1: Disc desiccation with mild disc bulge. Superimposed right foraminal disc protrusion with annular fissure contacts the exiting right L5 nerve root (series 8, image 5). Moderate bilateral facet hypertrophy. Resultant mild bilateral subarticular stenosis. Central canal remains patent. Moderate right L5 foraminal narrowing. Left or pharyngeal veins patent.   IMPRESSION: 1. 6 mm facet mediated anterolisthesis of L4 on L5 with resultant moderate to severe canal and bilateral subarticular stenosis, with mild bilateral L4 foraminal narrowing. 2. Right foraminal disc protrusion at L5-S1, contacting and potentially  irritating the exiting right L5 nerve root. 3. Additional mild for age spondylosis elsewhere within the lumbar spine as above. No other significant stenosis or neural impingement. 4. Compression deformity of the superior endplate of 624THL with superimposed endplate Schmorl's node deformity, partially visualized. There is mild marrow edema at the anterior aspect of the partially visualized superior endplate, which could reflect a superimposed acute to subacute fracture component. Correlation with physical exam for possible pain at this location recommended.     Electronically Signed   By: Jeannine Boga M.D.   On: 04/01/2022 19:49  She reports that she has never smoked. She has never used smokeless tobacco.  Recent Labs    08/01/21 1427 02/16/22 1313  HGBA1C 5.6 5.7    Objective:    Physical Exam  Gen: Well-appearing, tearful throghout conversation today; non-toxic CV:  Well-perfused. Warm.  Resp: Breathing unlabored on room air; no wheezing. Psych: Fluid speech in conversation; appropriate affect; normal thought process Neuro: Sensation intact throughout. No gross coordination deficits.   Ortho Exam - Lumbar: Positive TTP over the midline and the surrounding left-sided paraspinal musculature at the L4/L5 region.  Positive modified slump's test.  Deferred straight leg raise today. NVI.  Imaging:  - See MRI results above from 04/01/22  Past Medical/Family/Surgical/Social History: Medications & Allergies reviewed per EMR, new medications updated. Patient Active  Problem List   Diagnosis Date Noted   CKD (chronic kidney disease) stage 4, GFR 15-29 ml/min (HCC) 08/09/2021   Tear film insufficiency 04/19/2021   Sjogren's disease ([place]) 01/24/2021   Erroneous encounter - disregard 01/21/2021   Abnormal gait 12/10/2020   Hereditary and idiopathic neuropathy, unspecified 12/10/2020   Major depressive disorder, single episode, unspecified 12/10/2020   Osteoarthritis 12/10/2020    Esophageal dysphagia 09/16/2020   Dehydration 09/16/2020   AKI (acute kidney injury) ([place]) 09/16/2020   Elevated lipase 09/16/2020   Hyperglycemia due to diabetes mellitus ([place]) 09/16/2020   Vitamin D deficiency 10/01/2019   Polypharmacy 10/01/2019   Major depressive disorder, recurrent episode, moderate ([place]) 10/01/2019   Leg edema 06/04/2019   Chest pain of uncertain etiology [id number]   Hx of pulmonary embolus 03/30/2017   Physical exam 11/30/2016   Hyperlipidemia 12/26/2013   Encounter for therapeutic drug monitoring 04/16/2013   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   Long term (current) use of anticoagulants 06/30/2010   Exertional dyspnea 11/09/2008   Diabetes type 2, controlled ([place]) 08/27/2008   ANEMIA-NOS 08/27/2008   Seasonal and perennial allergic rhinitis 08/27/2008   PULMONARY NODULE 08/27/2008   Fibromyalgia 08/27/2008   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 ([place]) 03/20/2007   Hypothyroidism 03/20/2007   Essential hypertension 03/20/2007   Recurrent pulmonary emboli ([place]) 03/20/2007   GERD 03/20/2007   ESOPHAGEAL STRICTURE 03/08/2007   Past Medical History:  Diagnosis Date   Anemia    Anxiety    Breast cancer ([place])    Depression    Diabetes mellitus type II    Fibromyalgia    GERD (gastroesophageal reflux disease)    Hyperlipidemia    Hypertension    Malignant neoplasm of breast (female), unspecified site 1993   L breast s/p mastectomy and tamoxifen x 63yr   Other pulmonary embolism and infarction 2008 and 2009   chronic anticoag - [facility]   Unspecified hypothyroidism    Family History  Problem Relation Age of Onset   Depression Other        Parent   Arthritis Other        Parent, Grandparent   Hypertension Other        Grandparent   Hyperlipidemia Other        [place]  Miscarriages / Stillbirths Other        Grandparent   Stroke Other        [place]  Cancer Maternal Uncle        prostate   Hypertension Maternal  Grandfather    Breast cancer Cousin    Breast cancer Cousin    Past Surgical History:  Procedure Laterality Date   APPENDECTOMY     BALLOON DILATION N/A 10/18/2020   Procedure: BALLOON DILATION;  Surgeon: CEloise Harman DO;  Location[facility];  Service: Endoscopy;  Laterality: N/A;   BIOPSY  10/18/2020   Procedure: BIOPSY;  Surgeon: CEloise Harman DO;  Location: [facility];  Service: Endoscopy;;  gastric    BREAST BIOPSY Left 1993   BREAST BIOPSY Right 09/25/2014   stero. Benign   BREAST RECONSTRUCTION Left    CATARACT EXTRACTION     ESOPHAGOGASTRODUODENOSCOPY (EGD) WITH PROPOFOL N/A 10/18/2020   Procedure: ESOPHAGOGASTRODUODENOSCOPY (EGD) WITH PROPOFOL;  Surgeon: CEloise Harman DO;  Location: [facility];  Service: Endoscopy;  Laterality: N/A;  11:00am   MASTECTOMY Left    ROTATOR CUFF REPAIR     SPINAL FUSION  x 2   TOTAL ABDOMINAL HYSTERECTOMY W/ BILATERAL SALPINGOOPHORECTOMY     TUBAL LIGATION     Social History   Occupational History   Occupation: Armed forces technical officer: RETIRED   Occupation: Emergency planning/management officer   Occupation: school bus driver  Tobacco Use   Smoking status: Never   Smokeless tobacco: Never  Scientific laboratory technician Use: Never used  Substance and Sexual Activity   Alcohol use: No    Alcohol/week: 0.0 standard drinks of alcohol   Drug use: No   Sexual activity: Not Currently   I spent 38 minutes in the care of the patient today including face-to-face time, preparation to see the patient, as well as review of the MRI scan in the room with patient, discussion on medication management and conservative care, counseling provided for acute depressive episode, explanation of planned spinal injection for the above diagnoses.   Elba Barman, DO Primary Care Sports Medicine Physician  [place]  This note was dictated using Dragon naturally speaking software and may contain errors in syntax, spelling, or content  which have not been identified prior to signing this note.

## 2022-05-01 ENCOUNTER — Other Ambulatory Visit: Payer: Self-pay

## 2022-05-01 DIAGNOSIS — E119 Type 2 diabetes mellitus without complications: Secondary | ICD-10-CM | POA: Diagnosis not present

## 2022-05-01 DIAGNOSIS — Z79899 Other long term (current) drug therapy: Secondary | ICD-10-CM | POA: Insufficient documentation

## 2022-05-01 DIAGNOSIS — N289 Disorder of kidney and ureter, unspecified: Secondary | ICD-10-CM | POA: Insufficient documentation

## 2022-05-01 DIAGNOSIS — I10 Essential (primary) hypertension: Secondary | ICD-10-CM

## 2022-05-01 DIAGNOSIS — Z853 Personal history of malignant neoplasm of breast: Secondary | ICD-10-CM | POA: Insufficient documentation

## 2022-05-01 DIAGNOSIS — D649 Anemia, unspecified: Secondary | ICD-10-CM | POA: Insufficient documentation

## 2022-05-01 MED ORDER — HYDRALAZINE HCL 100 MG PO TABS: 100.0000 mg | ORAL_TABLET | Freq: Two times a day (BID) | ORAL | 2 refills | Status: DC

## 2022-05-01 NOTE — Telephone Encounter (Signed)
Per discussion with Dr. Virgina Jock - due to sustained SBP >140, increasing hydralazine from 58m BID to 1052mBID.  Patient confirmed she is still taking the valsartan 32063maily (previously thought she was not)  Systolic Blood Pressure [id number] Blood Pressure 84.999911113.0 - 94.0) Heart Rate 59.2 (54.0 - 66.0)  05/01/22 8:04 AM Blip 800 143 / 73 mmHg 59 bpm  04/30/22 8:51 AM Blip 800 158 / 81 mmHg 57 bpm  04/29/22 7:52 AM Blip 800 143 / 84 mmHg 58 bpm  04/28/22 9:34 AM Blip 800 144 / 88 mmHg 63 bpm  04/27/22 8:32 AM Blip 800 155 / 82 mmHg 59 bpm  04/26/22 9:31 AM Blip 800 156 / 91 mmHg 66 bpm  04/25/22 8:33 AM Blip 800 170 / 94 mmHg 59 bpm  04/23/22 10:37 AM Blip 800 155 / 86 mmHg 54 bpm  04/22/22 1:22 PM Blip 800 162 / 84 mmHg 59 bpm

## 2022-05-02 ENCOUNTER — Encounter (HOSPITAL_COMMUNITY): Payer: Self-pay

## 2022-05-02 ENCOUNTER — Ambulatory Visit: Payer: Medicare PPO | Admitting: Physical Medicine and Rehabilitation

## 2022-05-02 ENCOUNTER — Other Ambulatory Visit: Payer: Self-pay

## 2022-05-02 ENCOUNTER — Emergency Department (HOSPITAL_COMMUNITY)
Admission: EM | Admit: 2022-05-02 | Discharge: 2022-05-02 | Disposition: A | Discharge status: Home / Self Care | Payer: Medicare PPO | Attending: Emergency Medicine | Admitting: Emergency Medicine

## 2022-05-02 ENCOUNTER — Ambulatory Visit: Payer: Self-pay

## 2022-05-02 VITALS — BP 120/61 | HR 61

## 2022-05-02 DIAGNOSIS — N289 Disorder of kidney and ureter, unspecified: Secondary | ICD-10-CM

## 2022-05-02 DIAGNOSIS — M5416 Radiculopathy, lumbar region: Secondary | ICD-10-CM

## 2022-05-02 DIAGNOSIS — D649 Anemia, unspecified: Secondary | ICD-10-CM

## 2022-05-02 DIAGNOSIS — I10 Essential (primary) hypertension: Secondary | ICD-10-CM

## 2022-05-02 LAB — BASIC METABOLIC PANEL
Anion gap: 6 (ref 5–15)
BUN: 20 mg/dL (ref 8–23)
CO2: 21 mmol/L — ABNORMAL LOW (ref 22–32)
Calcium: 8.9 mg/dL (ref 8.9–10.3)
Chloride: 109 mmol/L (ref 98–111)
Creatinine, Ser: 1.47 mg/dL — ABNORMAL HIGH (ref 0.44–1.00)
Creatinine: 1.5 — AB (ref 0.5–1.1)
GFR, Estimated: 36 mL/min — ABNORMAL LOW (ref >60)
Glucose, Bld: 137 mg/dL — ABNORMAL HIGH (ref 70–99)
Potassium: 4.2 mmol/L (ref 3.5–5.1)
Sodium: 136 mmol/L (ref 135–145)

## 2022-05-02 LAB — CBC
HCT: 31.1 % — ABNORMAL LOW (ref 36.0–46.0)
Hemoglobin: 10.5 g/dL — ABNORMAL LOW (ref 12.0–15.0)
MCH: 32.8 pg (ref 26.0–34.0)
MCHC: 33.8 g/dL (ref 30.0–36.0)
MCV: 97.2 fL (ref 80.0–100.0)
Platelets: 155 10*3/uL (ref 150–400)
RBC: 3.2 MIL/uL — ABNORMAL LOW (ref 3.87–5.11)
RDW: 12.1 % (ref 11.5–15.5)
WBC: 5.1 10*3/uL (ref 4.0–10.5)
nRBC: 0 % (ref 0.0–0.2)

## 2022-05-02 LAB — CBG MONITORING, ED: Glucose-Capillary: 153 mg/dL — ABNORMAL HIGH (ref 70–99)

## 2022-05-02 LAB — TROPONIN I (HIGH SENSITIVITY): Troponin I (High Sensitivity): 5 ng/L (ref <18)

## 2022-05-02 LAB — CBC AND DIFFERENTIAL: Hemoglobin: 10.5 — AB (ref 12.0–16.0)

## 2022-05-02 MED ORDER — METHYLPREDNISOLONE ACETATE 80 MG/ML IJ SUSP
80.0000 mg | Freq: Once | INTRAMUSCULAR | Status: AC
  Administered 2022-05-02: 80 mg

## 2022-05-02 NOTE — ED Provider Notes (Signed)
[facility] Provider Note   CSN: [id number] Arrival date & time: 05/01/22  2350     History  Chief Complaint  Patient presents with   Hypertension    Andrea Simmons is a 82 y.o. female.  The history is provided by the patient.  Hypertension  She has history of hypertension, diabetes, hyperlipidemia, pulmonary embolism anticoagulated on warfarin, breast cancer comes in because of concerns about her blood sugar and current concerns about her blood pressure.  She says her blood pressure this morning was 198/100.  She tried to get in touch with her cardiologist who manages her blood pressure but was unable to do so.  She denies headache, chest pain, dyspnea.  Also, her blood sugar this morning was 140, it is normally not over 100 in the mornings.   Home Medications Prior to Admission medications   Medication Sig Start Date End Date Taking? Authorizing Provider  ACCU-CHEK GUIDE test strip CHECK BLOOD SUGARS DAILY 12/19/21   [name], MD  Accu-Chek Softclix Lancets lancets USE AS INSTRUCTED TO TEST SUGARS TWICE A DAY 09/12/21   [name], MD  amLODipine (NORVASC) 10 MG tablet TAKE 1 TABLET BY MOUTH EVERY DAY 11/18/21   [name], Reynold Bowen, MD  Biotin 1000 MCG tablet Take 1,000 mcg by mouth daily.    [provider]  Blood Glucose Monitoring Suppl (ACCU-CHEK GUIDE) w/Device KIT 1 each by Does not apply route 2 (two) times daily. To test sugars. Dx. E11.9 06/23/19   [name], MD  Colchicine (MITIGARE) 0.6 MG CAPS Take 1 capsule by mouth 2 (two) times daily as needed. 09/16/20   [name], Legrand Como, MD  DULoxetine (CYMBALTA) 30 MG capsule TAKE 3 CAPSULES BY MOUTH EVERY DAY 04/26/22   [name], Billey Co., MD  enoxaparin (LOVENOX) 60 MG/0.6ML injection Inject 0.6 mLs (60 mg total) into the skin daily. 10/11/20   Brunetta Genera, MD  eszopiclone (LUNESTA) 2 MG TABS tablet TAKE 1 TABLET (2 MG TOTAL) BY MOUTH IMMEDIATELY  BEFORE BEDTIME AS NEEDED FOR SLEEP 04/03/22   [name], Billey Co., MD  fenofibrate (TRICOR) 48 MG tablet Take 48 mg by mouth daily. 01/06/21   [provider]  ferrous sulfate 325 (65 FE) MG tablet TAKE 1 TABLET BY MOUTH EVERY DAY 04/26/22   [name], MD  FLAREX 0.1 % ophthalmic suspension Apply to eye. 02/09/21   [provider]  furosemide (LASIX) 20 MG tablet TAKE 1 TABLET BY MOUTH EVERY DAY AS NEEDED 04/17/22   [name], [name], MD  hydrALAZINE (APRESOLINE) 100 MG tablet Take 1 tablet (100 mg total) by mouth 2 (two) times daily. 05/01/22   [name], Reynold Bowen, MD  HYDROcodone-acetaminophen (NORCO/VICODIN) 5-325 MG tablet Take 1-2 tablets by mouth every 6 (six) hours as needed for severe pain. 04/28/22   Elba Barman, DO  labetalol (NORMODYNE) 200 MG tablet TAKE 1 TABLET BY MOUTH TWICE A DAY 11/18/21   [name], [name], MD  levothyroxine (SYNTHROID) 25 MCG tablet TAKE 1 TABLET BY MOUTH EVERY DAY BEFORE BREAKFAST 04/17/22   Wendie Agreste, MD  lidocaine (HM LIDOCAINE PATCH) 4 % Place 1 patch onto the skin daily. 01/24/22   Elba Barman, DO  linagliptin (TRADJENTA) 5 MG TABS tablet Take 5 mg by mouth daily.    [provider]  Magnesium 500 MG TABS Take 500 mg by mouth daily.    [provider]  nitroGLYCERIN (NITROSTAT) 0.4 MG SL  tablet Place 1 tablet (0.4 mg total) under the tongue every 5 (five) minutes as needed for chest pain. 10/31/18 03/03/21  [name], Reynold Bowen, MD  pantoprazole (PROTONIX) 40 MG tablet Take 1 tablet (40 mg total) by mouth 2 (two) times daily before a meal. 09/18/20 12/16/20  Manuella Ghazi, [name], DO  RESTASIS MULTIDOSE 0.05 % ophthalmic emulsion Place 1 drop into both eyes 2 (two) times daily. 06/21/16   [provider]  rosuvastatin (CRESTOR) 20 MG tablet Take 20 mg by mouth daily.    [provider]  tiZANidine (ZANAFLEX) 4 MG tablet Take 1 tablet (4 mg total) by mouth at bedtime. 02/20/22   Elba Barman, DO   valsartan (DIOVAN) 320 MG tablet TAKE 1 TABLET BY MOUTH EVERY DAY 08/12/21   [name], Reynold Bowen, MD  Vitamin D, Ergocalciferol, (DRISDOL) 1.25 MG (50000 UNIT) CAPS capsule TAKE 1 CAPSULE BY MOUTH ONE TIME PER WEEK 04/26/22   [name], Billey Co., MD  warfarin (COUMADIN) 5 MG tablet TAKE 1/2 TABLET (2.5 MG) BY MOUTH DAILY EXCEPT TAKE 1 TABLET (5 MG) ON MONDAYS, WEDNESDAYS AND SATURDAYS OR AS DIRECTED BY ANTICOAGULATION CLINIC 11/07/21   [name], MD      Allergies    Lipitor [atorvastatin], Morphine, Meperidine, and Naproxen sodium    Review of Systems   Review of Systems  All other systems reviewed and are negative.   Physical Exam Updated Vital Signs BP 112/66   Pulse 67   Temp 98.6 F (37 C) (Oral)   Resp 18   Wt 62.6 kg   SpO2 98%   BMI 23.69 kg/m  Physical Exam Vitals and nursing note reviewed.   82 year old female, resting comfortably and in no acute distress. Vital signs are normal. Oxygen saturation is 98%, which is normal. Head is normocephalic and atraumatic. PERRLA, EOMI. Oropharynx is clear. Neck is nontender and supple without adenopathy or JVD. Back is nontender and there is no CVA tenderness. Lungs are clear without rales, wheezes, or rhonchi. Chest is nontender. Heart has regular rate and rhythm without murmur. Abdomen is soft, flat, nontender. Extremities have no cyanosis or edema, full range of motion is present. Skin is warm and dry without rash. Neurologic: Mental status is normal, cranial nerves are intact, moves all extremities equally.  ED Results / Procedures / Treatments   Labs (all labs ordered are listed, but only abnormal results are displayed) Labs Reviewed  BASIC METABOLIC PANEL - Abnormal; Notable for the following components:      Result Value   CO2 21 (*)    Glucose, Bld 137 (*)    Creatinine, Ser 1.47 (*)    GFR, Estimated 36 (*)    All other components within normal limits  CBC - Abnormal; Notable for the following  components:   RBC 3.20 (*)    Hemoglobin 10.5 (*)    HCT 31.1 (*)    All other components within normal limits  CBG MONITORING, ED - Abnormal; Notable for the following components:   Glucose-Capillary 153 (*)    All other components within normal limits  TROPONIN I (HIGH SENSITIVITY)  TROPONIN I (HIGH SENSITIVITY)   Procedures Procedures    Medications Ordered in ED Medications - No data to display  ED Course/ Medical Decision Making/ A&P                             Medical Decision Making Amount and/or Complexity of  Data Reviewed Labs: ordered.   Elevated blood pressure at home but normal blood pressure in the emergency department.  Elevated glucose at home but certainly not at a critical level.  I have reviewed and interpreted her laboratory test results here, my interpretation is stable renal insufficiency, minimally elevated random glucose which is certainly not at a critical level, stable anemia.  Single troponin is normal.  In the absence of evidence of endorgan damage, no indication to make any changes to her blood pressure regimen and no indication for emergency medications for blood pressure.  I am referring her back to her cardiologist for management of her blood pressure, and to her endocrinologist for management of her diabetes.  Final Clinical Impression(s) / ED Diagnoses Final diagnoses:  Primary hypertension  Renal insufficiency  Normochromic normocytic anemia    Rx / DC Orders ED Discharge Orders     None         Delora Fuel, MD [id number]

## 2022-05-02 NOTE — ED Triage Notes (Signed)
Pt presents with hypertension (188/86) today. Pt takes BP medications and states that she has been taking meds at prescribed. Pt also complaining of hyperglycemia. Sugar 158 in triage. Endorses stressors.

## 2022-05-02 NOTE — Patient Instructions (Signed)

## 2022-05-02 NOTE — Discharge Instructions (Addendum)
Please continue to take your blood pressure medications and continue to monitor your blood pressure at home.  Questions about your blood pressure should be directed to your cardiologist.  Questions regarding your blood sugar should be directed to your endocrinologist.  Return to the emergency department if you develop severe headache, chest pain, difficulty breathing.  In the absence of these symptoms, it is very unlikely that we are going to do anything about your blood pressure in the emergency department.

## 2022-05-02 NOTE — Progress Notes (Signed)
Functional Pain Scale - descriptive words and definitions  Unmanageable (7)  Pain interferes with normal ADL's/nothing seems to help/sleep is very difficult/active distractions are very difficult to concentrate on. Severe range order  Average Pain 7   +Driver, -BT, -Dye Allergies.  Lower back pain that radiates down the left leg

## 2022-05-04 ENCOUNTER — Other Ambulatory Visit: Payer: Self-pay | Admitting: Psychiatry

## 2022-05-05 ENCOUNTER — Telehealth: Payer: Self-pay

## 2022-05-05 MED ORDER — HYDROCODONE-ACETAMINOPHEN 5-325 MG PO TABS: 1.0000 | ORAL_TABLET | Freq: Two times a day (BID) | ORAL | 0 refills | Status: AC | PRN

## 2022-05-05 NOTE — Telephone Encounter (Signed)
Spoke with patient and she stated she is in lot of pain after the injection. She stated she has pain all over and her leg where she fell hurts a lot more. She stated Dr. Rolena Infante gave her Hydrocodone and it is not touching her pain. Spoke with Dr. Ernestina Patches and Barnet Pall, NP and they agreed to send in  prescription for Hydrocodone. Advised patient to alternate heat and ice and to take Hydrocodone as directed. If pain gets worse, to go to urgent care or the ER. Patient verbalized understanding

## 2022-05-10 ENCOUNTER — Ambulatory Visit: Payer: Medicare PPO | Admitting: Orthopaedic Surgery

## 2022-05-10 ENCOUNTER — Ambulatory Visit (INDEPENDENT_AMBULATORY_CARE_PROVIDER_SITE_OTHER): Payer: Medicare PPO

## 2022-05-10 DIAGNOSIS — Z7901 Long term (current) use of anticoagulants: Secondary | ICD-10-CM

## 2022-05-10 LAB — POCT INR: INR: 3.6 — AB (ref 2.0–3.0)

## 2022-05-10 NOTE — Patient Instructions (Addendum)
Pre visit review using our clinic review tool, if applicable. No additional management support is needed unless otherwise documented below in the visit note.  Hold dose today and then change weekly dose to take 1/2 tablet daily except take 1 tablet on Wednesdays and Saturdays. Recheck in 2 weeks.

## 2022-05-10 NOTE — Progress Notes (Signed)
Pt has been in ER for hypertension, seeing cardiology for this. Pt reports she is not as many greens. Hold dose today and then change weekly dose to take 1/2 tablet daily except take 1 tablet on Wednesdays and Saturdays. Recheck in 2 weeks.

## 2022-05-15 DIAGNOSIS — E1122 Type 2 diabetes mellitus with diabetic chronic kidney disease: Secondary | ICD-10-CM | POA: Diagnosis not present

## 2022-05-15 DIAGNOSIS — I129 Hypertensive chronic kidney disease with stage 1 through stage 4 chronic kidney disease, or unspecified chronic kidney disease: Secondary | ICD-10-CM | POA: Diagnosis not present

## 2022-05-15 DIAGNOSIS — N1832 Chronic kidney disease, stage 3b: Secondary | ICD-10-CM | POA: Diagnosis not present

## 2022-05-15 DIAGNOSIS — N183 Chronic kidney disease, stage 3 unspecified: Secondary | ICD-10-CM

## 2022-05-15 DIAGNOSIS — D631 Anemia in chronic kidney disease: Secondary | ICD-10-CM | POA: Diagnosis not present

## 2022-05-15 HISTORY — DX: Anemia in chronic kidney disease: D63.1

## 2022-05-15 HISTORY — DX: Chronic kidney disease, stage 3 unspecified: N18.30

## 2022-05-15 LAB — PROTEIN / CREATININE RATIO, URINE
Albumin, U: 81.8
Creatinine, Urine: 248.2

## 2022-05-15 LAB — MICROALBUMIN / CREATININE URINE RATIO: Microalb Creat Ratio: 33

## 2022-05-15 NOTE — Procedures (Signed)
Lumbosacral Transforaminal Epidural Steroid Injection - Sub-Pedicular Approach with Fluoroscopic Guidance  Patient: Andrea Simmons      Date of Birth: July 07, 1940 MRN: [id number] PCP: [name], MD      Visit Date: 05/02/2022   Universal Protocol:    Date/Time: 05/02/2022  Consent Given By: the patient  Position: PRONE  Additional Comments: Vital signs were monitored before and after the procedure. Patient was prepped and draped in the usual sterile fashion. The correct patient, procedure, and site was verified.   Injection Procedure Details:   Procedure diagnoses: Lumbar radiculopathy [M54.16]    Meds Administered:  Meds ordered this encounter  Medications   methylPREDNISolone acetate (DEPO-MEDROL) injection 80 mg   HYDROcodone-acetaminophen (NORCO/VICODIN) 5-325 MG tablet    Sig: Take 1 tablet by mouth every 12 (twelve) hours as needed for up to 5 days for moderate pain or severe pain.    Dispense:  7 tablet    Refill:  0    Laterality: Left  Location/Site: L4  Needle:5.0 in., 22 ga.  Short bevel or Quincke spinal needle  Needle Placement: Transforaminal  Findings:    -Comments: Excellent flow of contrast along the nerve, nerve root and into the epidural space.  Procedure Details: After squaring off the end-plates to get a true AP view, the C-arm was positioned so that an oblique view of the foramen as noted above was visualized. The target area is just inferior to the "nose of the scotty dog" or sub pedicular. The soft tissues overlying this structure were infiltrated with 2-3 ml. of 1% Lidocaine without Epinephrine.  The spinal needle was inserted toward the target using a "trajectory" view along the fluoroscope beam.  Under AP and lateral visualization, the needle was advanced so it did not puncture dura and was located close the 6 O'Clock position of the pedical in AP tracterory. Biplanar projections were used to confirm position. Aspiration was  confirmed to be negative for CSF and/or blood. A 1-2 ml. volume of Isovue-250 was injected and flow of contrast was noted at each level. Radiographs were obtained for documentation purposes.   After attaining the desired flow of contrast documented above, a 0.5 to 1.0 ml test dose of 0.25% Marcaine was injected into each respective transforaminal space.  The patient was observed for 90 seconds post injection.  After no sensory deficits were reported, and normal lower extremity motor function was noted,   the above injectate was administered so that equal amounts of the injectate were placed at each foramen (level) into the transforaminal epidural space.   Additional Comments:  No complications occurred Dressing: 2 x 2 sterile gauze and Band-Aid    Post-procedure details: Patient was observed during the procedure. Post-procedure instructions were reviewed.  Patient left the clinic in stable condition.

## 2022-05-15 NOTE — Progress Notes (Signed)
Andrea Simmons - 82 y.o. female MRN [id number]  Date of birth: 11-05-1940  Office Visit Note: Visit Date: 05/02/2022 PCP: [name], MD Referred by: [name], MD  Subjective: No chief complaint on file.  HPI:  Andrea Simmons is a 82 y.o. female who comes in today at the request of Dr. Elba Barman for planned Left L4-5 Lumbar Transforaminal epidural steroid injection with fluoroscopic guidance.  The patient has failed conservative care including home exercise, medications, time and activity modification.  This injection will be diagnostic and hopefully therapeutic.  Please see requesting physician notes for further details and justification.   ROS Otherwise per HPI.  Assessment & Plan: Visit Diagnoses:    ICD-10-CM   1. Lumbar radiculopathy  M54.16 XR C-ARM NO REPORT    Epidural Steroid injection    methylPREDNISolone acetate (DEPO-MEDROL) injection 80 mg      Plan: No additional findings.   Meds & Orders:  Meds ordered this encounter  Medications   methylPREDNISolone acetate (DEPO-MEDROL) injection 80 mg   HYDROcodone-acetaminophen (NORCO/VICODIN) 5-325 MG tablet    Sig: Take 1 tablet by mouth every 12 (twelve) hours as needed for up to 5 days for moderate pain or severe pain.    Dispense:  7 tablet    Refill:  0    Orders Placed This Encounter  Procedures   XR C-ARM NO REPORT   Epidural Steroid injection    Follow-up: Return for visit to requesting provider as needed.   Procedures: No procedures performed  Lumbosacral Transforaminal Epidural Steroid Injection - Sub-Pedicular Approach with Fluoroscopic Guidance  Patient: Andrea Simmons      Date of Birth: 1941-03-16 MRN: [id number] PCP: [name], MD      Visit Date: 05/02/2022   Universal Protocol:    Date/Time: 05/02/2022  Consent Given By: the patient  Position: PRONE  Additional Comments: Vital signs were monitored before and after the procedure. Patient was prepped and  draped in the usual sterile fashion. The correct patient, procedure, and site was verified.   Injection Procedure Details:   Procedure diagnoses: Lumbar radiculopathy [M54.16]    Meds Administered:  Meds ordered this encounter  Medications   methylPREDNISolone acetate (DEPO-MEDROL) injection 80 mg   HYDROcodone-acetaminophen (NORCO/VICODIN) 5-325 MG tablet    Sig: Take 1 tablet by mouth every 12 (twelve) hours as needed for up to 5 days for moderate pain or severe pain.    Dispense:  7 tablet    Refill:  0    Laterality: Left  Location/Site: L4  Needle:5.0 in., 22 ga.  Short bevel or Quincke spinal needle  Needle Placement: Transforaminal  Findings:    -Comments: Excellent flow of contrast along the nerve, nerve root and into the epidural space.  Procedure Details: After squaring off the end-plates to get a true AP view, the C-arm was positioned so that an oblique view of the foramen as noted above was visualized. The target area is just inferior to the "nose of the scotty dog" or sub pedicular. The soft tissues overlying this structure were infiltrated with 2-3 ml. of 1% Lidocaine without Epinephrine.  The spinal needle was inserted toward the target using a "trajectory" view along the fluoroscope beam.  Under AP and lateral visualization, the needle was advanced so it did not puncture dura and was located close the 6 O'Clock position of the pedical in AP tracterory. Biplanar projections were used to confirm position. Aspiration was confirmed to be negative for  CSF and/or blood. A 1-2 ml. volume of Isovue-250 was injected and flow of contrast was noted at each level. Radiographs were obtained for documentation purposes.   After attaining the desired flow of contrast documented above, a 0.5 to 1.0 ml test dose of 0.25% Marcaine was injected into each respective transforaminal space.  The patient was observed for 90 seconds post injection.  After no sensory deficits were reported,  and normal lower extremity motor function was noted,   the above injectate was administered so that equal amounts of the injectate were placed at each foramen (level) into the transforaminal epidural space.   Additional Comments:  No complications occurred Dressing: 2 x 2 sterile gauze and Band-Aid    Post-procedure details: Patient was observed during the procedure. Post-procedure instructions were reviewed.  Patient left the clinic in stable condition.    Clinical History: MRI LUMBAR SPINE WITHOUT CONTRAST   TECHNIQUE: Multiplanar, multisequence MR imaging of the lumbar spine was performed. No intravenous contrast was administered.   COMPARISON:  Prior study from 02/25/2019.   FINDINGS: Segmentation: Standard. Lowest well-formed disc space labeled the L5-S1 level.   Alignment: 6 mm facet mediated anterolisthesis of L4 on L5. Trace 2 mm retrolisthesis of T12 on L1 and L1 and L2.   Vertebrae: Compression deformity of the superior endplate of 624THL with superimposed endplate Schmorl's node deformity, partially visualized. There is mild marrow edema at the anterior aspect of the partially visualized superior endplate, which could reflect a superimposed acute to subacute component (series 4, image 8). No retropulsion. Vertebral body height otherwise maintained with no other acute or chronic fracture. Bone marrow signal intensity within normal limits. No worrisome osseous lesions. No other abnormal marrow edema.   Conus medullaris and cauda equina: Conus extends to the T12 level. Conus and cauda equina appear normal.   Paraspinal and other soft tissues: Unremarkable.   Disc levels:   T12-L1: Retrolisthesis with mild disc bulge and endplate spurring. Mild facet hypertrophy. No canal or foraminal stenosis.   L1-2: Retrolisthesis with mild disc bulge and endplate spurring. Mild facet hypertrophy. No spinal stenosis. Foramina remain patent.   L2-3: Disc desiccation with  minimal disc bulge. Mild to moderate facet and ligament flavum hypertrophy. No significant spinal stenosis. Foramina remain patent.   L3-4: Mild degenerative intervertebral disc space narrowing. Disc desiccation with mild disc bulge. Moderate bilateral facet hypertrophy. No significant spinal stenosis. Foramina remain patent.   L4-5: 6 mm facet mediated anterolisthesis. Disc desiccation with associated broad posterior pseudo disc bulge/uncovering. Moderate to advanced bilateral facet arthrosis. Resultant moderate to severe canal with bilateral subarticular stenosis, with mild bilateral L4 foraminal narrowing.   L5-S1: Disc desiccation with mild disc bulge. Superimposed right foraminal disc protrusion with annular fissure contacts the exiting right L5 nerve root (series 8, image 5). Moderate bilateral facet hypertrophy. Resultant mild bilateral subarticular stenosis. Central canal remains patent. Moderate right L5 foraminal narrowing. Left or pharyngeal veins patent.   IMPRESSION: 1. 6 mm facet mediated anterolisthesis of L4 on L5 with resultant moderate to severe canal and bilateral subarticular stenosis, with mild bilateral L4 foraminal narrowing. 2. Right foraminal disc protrusion at L5-S1, contacting and potentially irritating the exiting right L5 nerve root. 3. Additional mild for age spondylosis elsewhere within the lumbar spine as above. No other significant stenosis or neural impingement. 4. Compression deformity of the superior endplate of 624THL with superimposed endplate Schmorl's node deformity, partially visualized. There is mild marrow edema at the anterior aspect of the partially visualized  superior endplate, which could reflect a superimposed acute to subacute fracture component. Correlation with physical exam for possible pain at this location recommended.     Electronically Signed   By: Jeannine Boga M.D.   On: 04/01/2022 19:49     Objective:  VS:  HT:     WT:   BMI:     BP:120/61  HR:61bpm  TEMP: ( )  RESP:  Physical Exam Vitals and nursing note reviewed.  Constitutional:      General: She is not in acute distress.    Appearance: Normal appearance. She is not ill-appearing.  HENT:     Head: Normocephalic and atraumatic.     Right Ear: External ear normal.     Left Ear: External ear normal.  Eyes:     Extraocular Movements: Extraocular movements intact.  Cardiovascular:     Rate and Rhythm: Normal rate.     Pulses: Normal pulses.  Pulmonary:     Effort: Pulmonary effort is normal. No respiratory distress.  Abdominal:     General: There is no distension.     Palpations: Abdomen is soft.  Musculoskeletal:        General: Tenderness present.     Cervical back: Neck supple.     Right lower leg: No edema.     Left lower leg: No edema.     Comments: Patient has good distal strength with no pain over the greater trochanters.  No clonus or focal weakness.  Skin:    Findings: No erythema, lesion or rash.  Neurological:     General: No focal deficit present.     Mental Status: She is alert and oriented to person, place, and time.     Sensory: No sensory deficit.     Motor: No weakness or abnormal muscle tone.     Coordination: Coordination normal.  Psychiatric:        Mood and Affect: Mood normal.        Behavior: Behavior normal.      Imaging: No results found.

## 2022-05-23 DIAGNOSIS — E039 Hypothyroidism, unspecified: Secondary | ICD-10-CM | POA: Diagnosis not present

## 2022-05-23 DIAGNOSIS — E1165 Type 2 diabetes mellitus with hyperglycemia: Secondary | ICD-10-CM | POA: Diagnosis not present

## 2022-05-24 ENCOUNTER — Ambulatory Visit (INDEPENDENT_AMBULATORY_CARE_PROVIDER_SITE_OTHER): Payer: Medicare PPO

## 2022-05-24 DIAGNOSIS — Z7901 Long term (current) use of anticoagulants: Secondary | ICD-10-CM | POA: Diagnosis not present

## 2022-05-24 LAB — POCT INR: INR: 1.8 — AB (ref 2.0–3.0)

## 2022-05-24 LAB — CMP 10231: EGFR: 36

## 2022-05-24 NOTE — Progress Notes (Signed)
Pt reports increase in vit K rich foods.  Increase dose today to take 1 1/2 tablets and the continue 1/2 tablet daily except take 1 tablet on Wednesdays and Saturdays. Recheck in 2 weeks.

## 2022-05-24 NOTE — Patient Instructions (Addendum)
Pre visit review using our clinic review tool, if applicable. No additional management support is needed unless otherwise documented below in the visit note.  Increase dose today to take 1 1/2 tablets and the continue 1/2 tablet daily except take 1 tablet on Wednesdays and Saturdays. Recheck in 2 weeks.

## 2022-05-25 DIAGNOSIS — I10 Essential (primary) hypertension: Secondary | ICD-10-CM | POA: Diagnosis not present

## 2022-05-30 ENCOUNTER — Other Ambulatory Visit: Payer: Self-pay | Admitting: Family Medicine

## 2022-05-30 DIAGNOSIS — I10 Essential (primary) hypertension: Secondary | ICD-10-CM | POA: Diagnosis not present

## 2022-05-30 DIAGNOSIS — E7800 Pure hypercholesterolemia, unspecified: Secondary | ICD-10-CM | POA: Diagnosis not present

## 2022-05-30 DIAGNOSIS — E039 Hypothyroidism, unspecified: Secondary | ICD-10-CM | POA: Diagnosis not present

## 2022-05-30 DIAGNOSIS — G609 Hereditary and idiopathic neuropathy, unspecified: Secondary | ICD-10-CM | POA: Diagnosis not present

## 2022-05-30 DIAGNOSIS — Z7901 Long term (current) use of anticoagulants: Secondary | ICD-10-CM

## 2022-05-30 DIAGNOSIS — E1165 Type 2 diabetes mellitus with hyperglycemia: Secondary | ICD-10-CM | POA: Diagnosis not present

## 2022-05-31 NOTE — Telephone Encounter (Signed)
Patient is requesting a refill of the following medications: Requested Prescriptions   Pending Prescriptions Disp Refills   warfarin (COUMADIN) 5 MG tablet [Pharmacy Med Name: WARFARIN SODIUM 5 MG TABLET] 75 tablet 2    Sig: TAKE 1/2 TABLET (2.5 MG) BY MOUTH DAILY EXCEPT TAKE 1 TABLET (5 MG) ON MONDAYS, [place] OR AS DIRECTED BY ANTICOAGULATION CLINIC    Date of patient request: 05/31/22 Last office visit: 02/16/22 Date of last refill: 11/07/21 Last refill amount: 75 Follow up time period per chart: 34month

## 2022-06-01 DIAGNOSIS — M3501 Sicca syndrome with keratoconjunctivitis: Secondary | ICD-10-CM | POA: Diagnosis not present

## 2022-06-01 DIAGNOSIS — H16143 Punctate keratitis, bilateral: Secondary | ICD-10-CM | POA: Diagnosis not present

## 2022-06-07 ENCOUNTER — Other Ambulatory Visit: Payer: Self-pay | Admitting: Cardiology

## 2022-06-07 ENCOUNTER — Other Ambulatory Visit: Payer: Self-pay

## 2022-06-07 ENCOUNTER — Ambulatory Visit (INDEPENDENT_AMBULATORY_CARE_PROVIDER_SITE_OTHER): Payer: Medicare PPO

## 2022-06-07 DIAGNOSIS — Z7901 Long term (current) use of anticoagulants: Secondary | ICD-10-CM | POA: Diagnosis not present

## 2022-06-07 DIAGNOSIS — Z79899 Other long term (current) drug therapy: Secondary | ICD-10-CM

## 2022-06-07 DIAGNOSIS — I1A0 Resistant hypertension: Secondary | ICD-10-CM

## 2022-06-07 LAB — POCT INR: INR: 1.6 — AB (ref 2.0–3.0)

## 2022-06-07 NOTE — Progress Notes (Signed)
Blood pressure remains uncontrolled on 4 agents. Recommend renal artery duplex.   Nigel Mormon, MD Pager: [phone] Office: [phone]

## 2022-06-07 NOTE — Progress Notes (Signed)
Pt reports she has not been eating very many proteins.   Increase dose today to take 1 1/2 tablets and increase dose tomorrow to take 1 tablet and the change weekly dosing to take 1/2 tablet daily except take 1 tablet on Mondays, Wednesdays and Fridays Recheck in 2 weeks.

## 2022-06-07 NOTE — Patient Instructions (Addendum)
Pre visit review using our clinic review tool, if applicable. No additional management support is needed unless otherwise documented below in the visit note.  Increase dose today to take 1 1/2 tablets and increase dose tomorrow to take 1 tablet and the change weekly dosing to take 1/2 tablet daily except take 1 tablet on Mondays, Wednesdays and Fridays Recheck in 2 weeks.

## 2022-06-20 ENCOUNTER — Other Ambulatory Visit: Payer: Self-pay | Admitting: Cardiology

## 2022-06-20 DIAGNOSIS — I10 Essential (primary) hypertension: Secondary | ICD-10-CM

## 2022-06-21 ENCOUNTER — Ambulatory Visit (INDEPENDENT_AMBULATORY_CARE_PROVIDER_SITE_OTHER): Payer: Medicare PPO

## 2022-06-21 DIAGNOSIS — Z7901 Long term (current) use of anticoagulants: Secondary | ICD-10-CM

## 2022-06-21 LAB — POCT INR: INR: 1.9 — AB (ref 2.0–3.0)

## 2022-06-21 NOTE — Progress Notes (Signed)
Pt reports she has started taking boost supplements to increase protein intake. She reports a poor diet lately and not wanting to eat much. Increase dose today to take 1 1/2 tablets and then change weekly dosing to take 1 tablet daily except take 1/2 tablet on Mondays, Wednesdays and Fridays Recheck in 3 weeks.

## 2022-06-21 NOTE — Patient Instructions (Addendum)
Pre visit review using our clinic review tool, if applicable. No additional management support is needed unless otherwise documented below in the visit note.  Increase dose today to take 1 1/2 tablets and then change weekly dosing to take 1 tablet daily except take 1/2 tablet on Mondays, Wednesdays and Fridays Recheck in 3 weeks.

## 2022-06-24 DIAGNOSIS — I10 Essential (primary) hypertension: Secondary | ICD-10-CM | POA: Diagnosis not present

## 2022-06-24 IMAGING — MR MR CERVICAL SPINE W/O CM
4 of 5 series · 28 of 48 positions shown · non-contrast
Comparison: 02/25/2019

CLINICAL DATA: Neck pain and radicular arm pain. History of remote
C-spine surgery

EXAM:
MRI CERVICAL SPINE WITHOUT CONTRAST
TECHNIQUE: Multiplanar, multisequence MR imaging of the cervical spine was
performed. No intravenous contrast was administered.

[Series 3: T1 · sagittal · 3.0mm · 0.41mm/px · 8 of 17 slices shown]
[im 1/17]
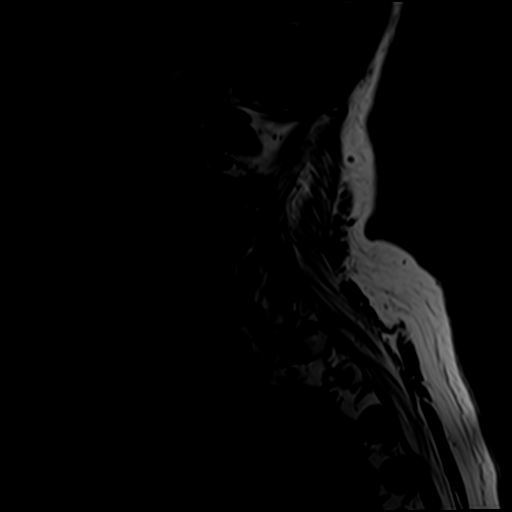
[im 3/17]
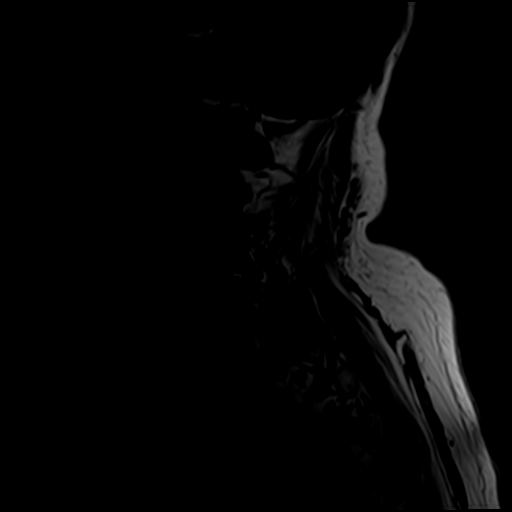
[im 5/17]
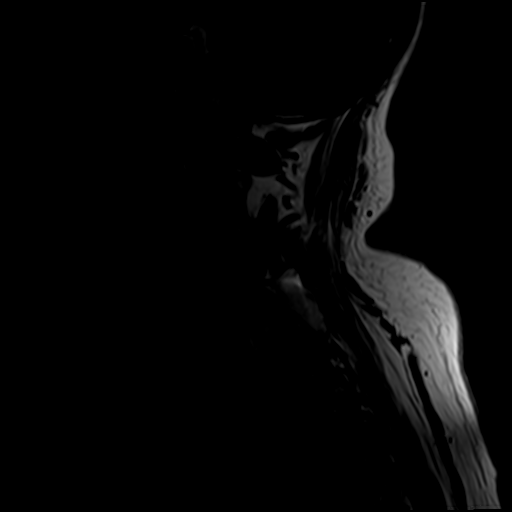
[im 7/17]
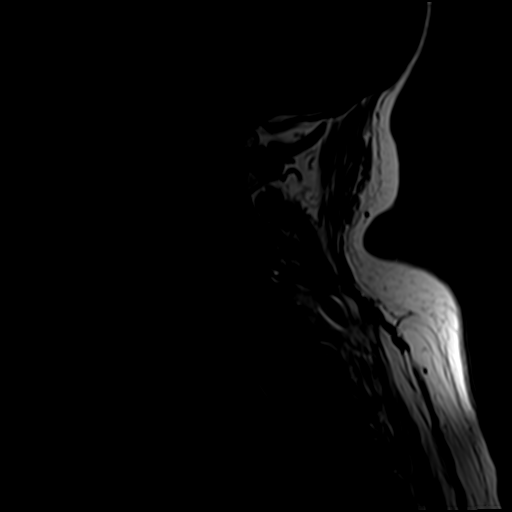
[im 10/17]
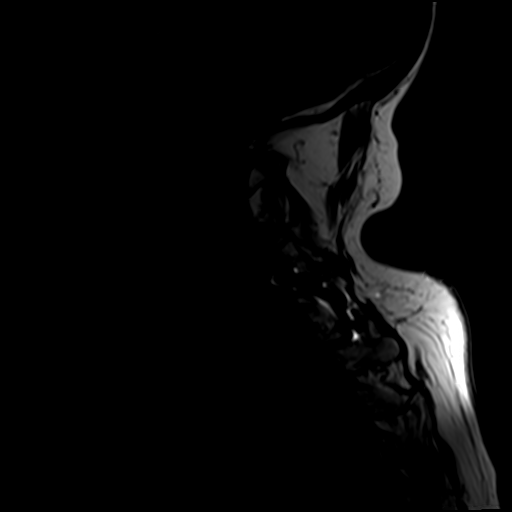
[im 12/17]
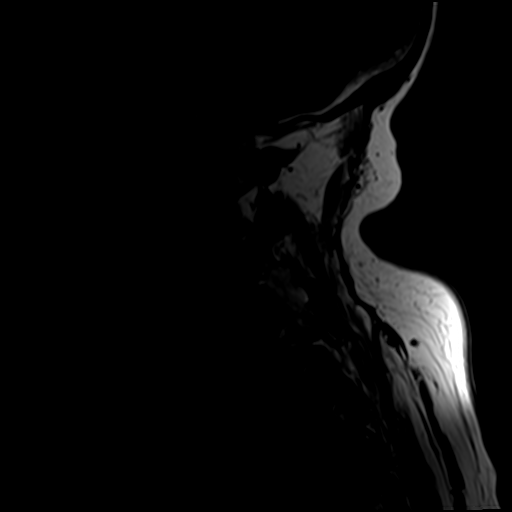
[im 14/17]
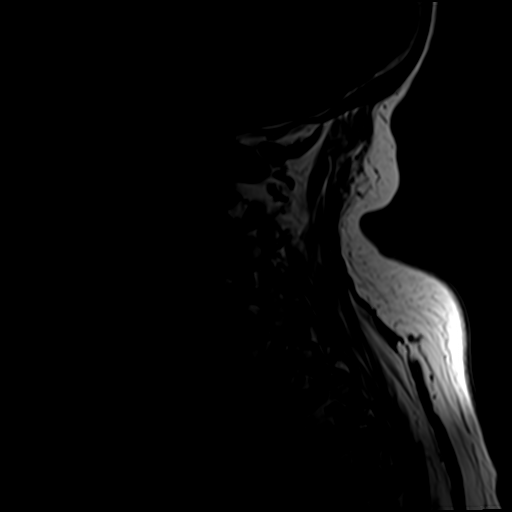
[im 17/17]
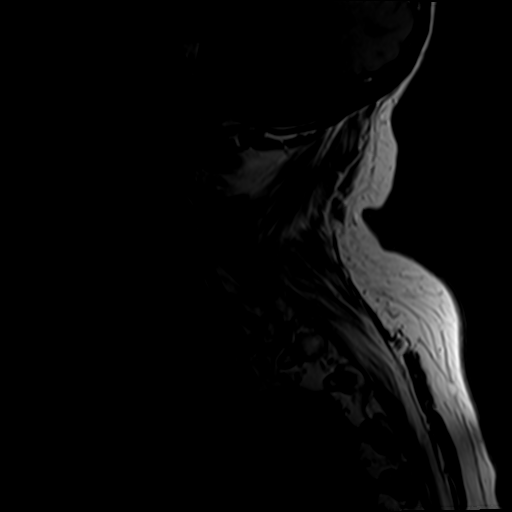

[Series 4: T2 · sagittal · 3.0mm · 0.41mm/px · 7 of 17 slices shown (1 of 2)]
[im 1/17]
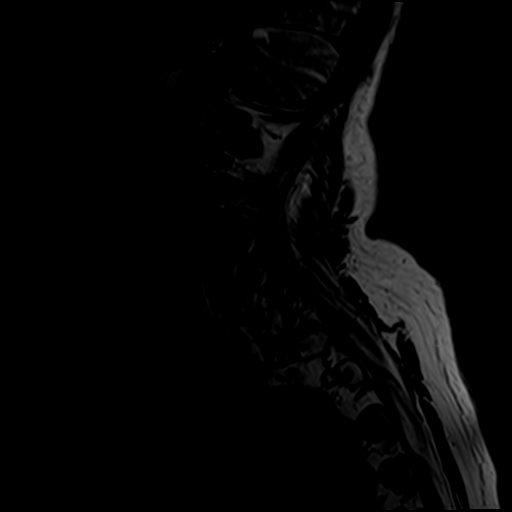
[im 3/17]
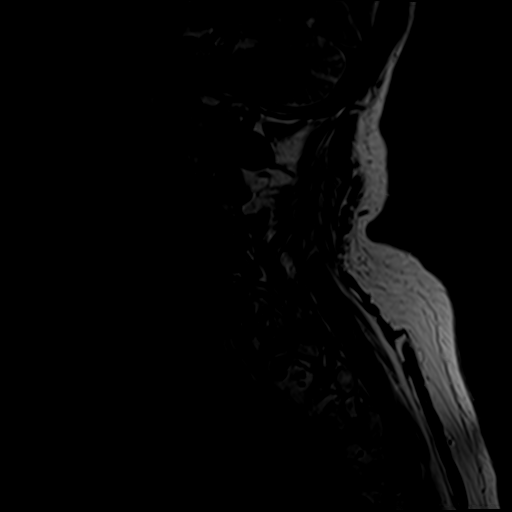
[im 6/17]
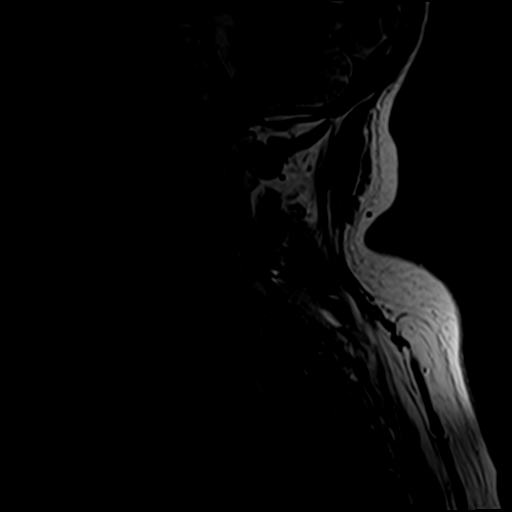
[im 9/17]
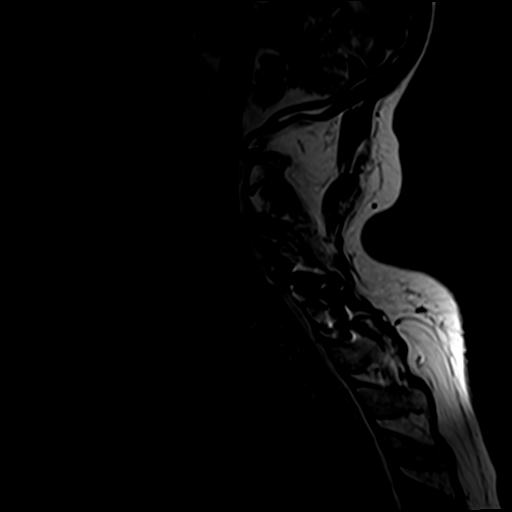
[im 11/17]
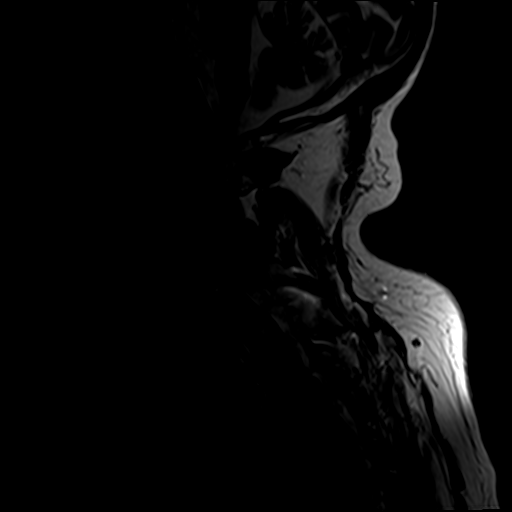
[im 14/17]
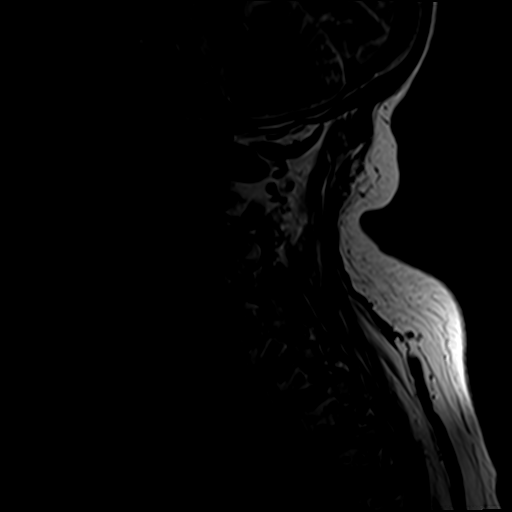
[im 17/17]
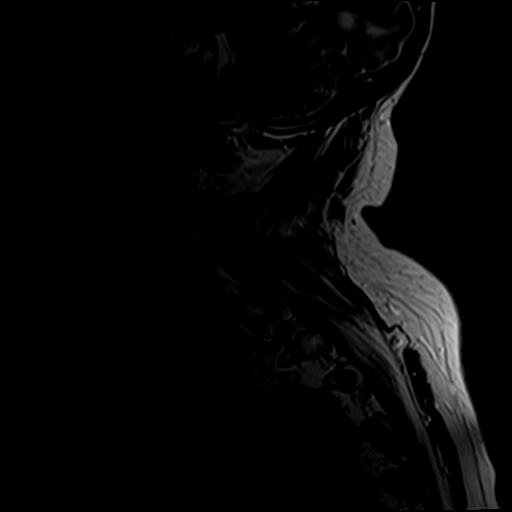

[Series 5: STIR · sagittal · 3.0mm · 0.82mm/px · 4 of 17 slices shown]
[im 1/17]
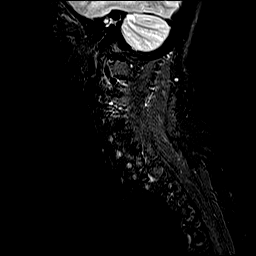
[im 3/17]
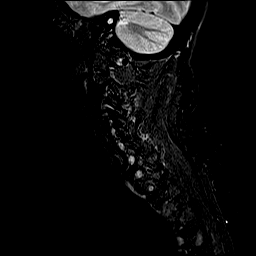
[im 9/17]
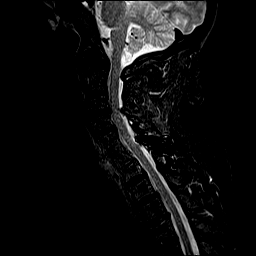
[im 14/17]
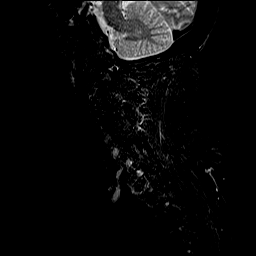

[Series 7: T2 · axial · 3.0mm · 0.70mm/px · z∈[-73,+28]mm · 9 of 29 slices shown (2 of 2)]
[im 1/29]
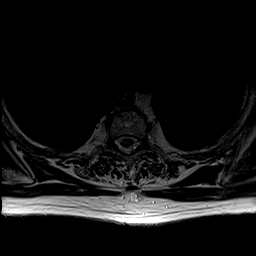
[im 5/29]
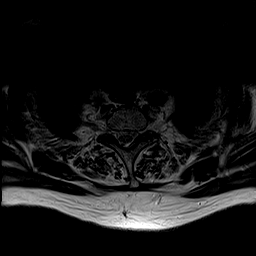
[im 10/29]
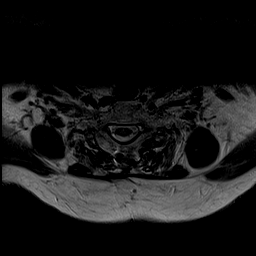
[im 12/29]
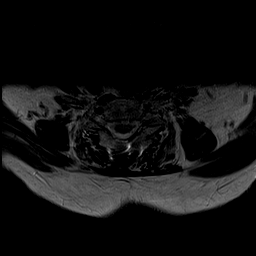
[im 15/29]
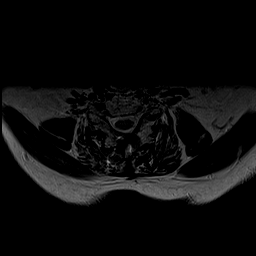
[im 17/29]
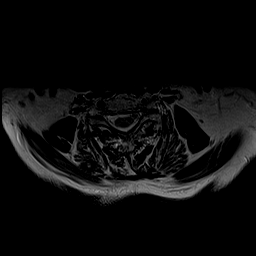
[im 19/29]
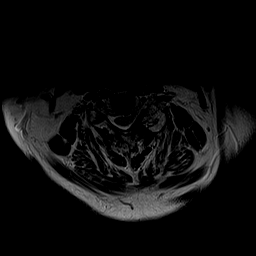
[im 24/29]
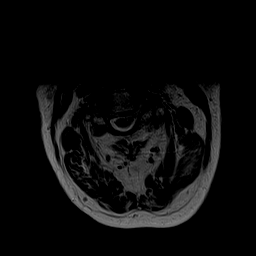
[im 29/29]
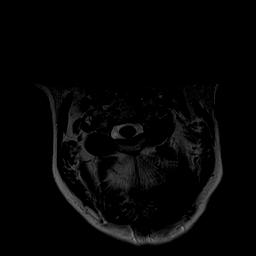

[28 of 48 positions shown; findings below may reference images not displayed]

FINDINGS: Alignment: Trace anterolisthesis C6-C7, unchanged.

Vertebrae: Solid fusion across C5-C6 and C6-C7. No acute fracture
cerclage wires are noted posteriorly at C6-C7, which causes
artifact.

Cord: Normal cord signal and morphology.

Posterior Fossa, vertebral arteries, paraspinal tissues: Negative

Disc levels:

C2-C3: Left uncovertebral and facet arthropathy. No spinal canal
stenosis. Moderate left neural foraminal narrowing.

C3-C4: Disc bulge with superimposed central protrusion, which
indents the ventral cord. Right-greater-than-left uncovertebral and
facet arthropathy. Mild spinal canal stenosis, unchanged. Moderate
to severe bilateral neural foraminal narrowing, also unchanged.

C4-C5: Mild disc bulge. Uncovertebral and facet arthropathy. No
spinal canal stenosis. Moderate bilateral neural foraminal
narrowing.

C5-C6: Solid arthrodesis. No spinal canal stenosis or significant
neural foraminal narrowing.

C6-C7: Trace anterolisthesis. Solid arthrodesis. No spinal canal
stenosis or neural foraminal narrowing.

C7-T1: Disc bulge with superimposed left paracentral protrusion.
Facet arthropathy. No spinal canal stenosis. Mild bilateral neural
foraminal narrowing.
IMPRESSION: 1. C3-C4 mild spinal canal stenosis and moderate to severe bilateral
neural foraminal narrowing, unchanged from prior exam.
2. C4-C5 moderate bilateral neural foraminal narrowing.
3. C2-C3 moderate left neural foraminal narrowing.
4. C7-T1 mild bilateral neural foraminal narrowing.

## 2022-06-26 DIAGNOSIS — M3501 Sicca syndrome with keratoconjunctivitis: Secondary | ICD-10-CM | POA: Diagnosis not present

## 2022-06-26 DIAGNOSIS — H16143 Punctate keratitis, bilateral: Secondary | ICD-10-CM | POA: Diagnosis not present

## 2022-06-26 DIAGNOSIS — H1045 Other chronic allergic conjunctivitis: Secondary | ICD-10-CM | POA: Diagnosis not present

## 2022-07-06 DIAGNOSIS — H1045 Other chronic allergic conjunctivitis: Secondary | ICD-10-CM | POA: Diagnosis not present

## 2022-07-12 ENCOUNTER — Ambulatory Visit (INDEPENDENT_AMBULATORY_CARE_PROVIDER_SITE_OTHER): Payer: Medicare PPO

## 2022-07-12 DIAGNOSIS — Z7901 Long term (current) use of anticoagulants: Secondary | ICD-10-CM

## 2022-07-12 LAB — POCT INR: INR: 1.7 — AB (ref 2.0–3.0)

## 2022-07-12 NOTE — Patient Instructions (Addendum)
Pre visit review using our clinic review tool, if applicable. No additional management support is needed unless otherwise documented below in the visit note.  Increase dose today to take 1 tablets and then change weekly dosing to take 1 tablet daily except take 1/2 tablet on Mondays and Thursdays. Recheck in 2 weeks.

## 2022-07-12 NOTE — Progress Notes (Addendum)
Increase dose today to take 1 tablets and then change weekly dosing to take 1 tablet daily except take 1/2 tablet on Mondays and Thursdays Recheck in 2 weeks.

## 2022-07-14 ENCOUNTER — Other Ambulatory Visit: Payer: Self-pay | Admitting: Cardiology

## 2022-07-14 DIAGNOSIS — I10 Essential (primary) hypertension: Secondary | ICD-10-CM

## 2022-07-18 ENCOUNTER — Ambulatory Visit: Payer: Medicare PPO

## 2022-07-18 DIAGNOSIS — I10 Essential (primary) hypertension: Secondary | ICD-10-CM | POA: Diagnosis not present

## 2022-07-18 DIAGNOSIS — I1A0 Resistant hypertension: Secondary | ICD-10-CM

## 2022-07-19 ENCOUNTER — Other Ambulatory Visit: Payer: Self-pay | Admitting: Cardiology

## 2022-07-19 DIAGNOSIS — I10 Essential (primary) hypertension: Secondary | ICD-10-CM

## 2022-07-24 DIAGNOSIS — I10 Essential (primary) hypertension: Secondary | ICD-10-CM | POA: Diagnosis not present

## 2022-07-26 ENCOUNTER — Telehealth: Payer: Self-pay

## 2022-07-26 ENCOUNTER — Ambulatory Visit: Payer: Medicare PPO

## 2022-07-26 NOTE — Telephone Encounter (Signed)
Pt called requesting to [facility] coumadin clinic apt today due to conflict with another apt she has.   RS for Monday, 5/13. Advised to follow instructions given at last apt and contact coumadin clinic if any changes. Pt verbalized understanding.  Apt RS.

## 2022-07-27 DIAGNOSIS — M3501 Sicca syndrome with keratoconjunctivitis: Secondary | ICD-10-CM | POA: Diagnosis not present

## 2022-07-27 DIAGNOSIS — H1045 Other chronic allergic conjunctivitis: Secondary | ICD-10-CM | POA: Diagnosis not present

## 2022-07-27 DIAGNOSIS — H0288A Meibomian gland dysfunction right eye, upper and lower eyelids: Secondary | ICD-10-CM | POA: Diagnosis not present

## 2022-07-27 DIAGNOSIS — H0288B Meibomian gland dysfunction left eye, upper and lower eyelids: Secondary | ICD-10-CM | POA: Diagnosis not present

## 2022-07-30 NOTE — Progress Notes (Unsigned)
Follow up visit  Subjective:   Andrea Simmons, female    DOB: 13-Feb-1941, 82 y.o.   MRN: [id number]   No chief complaint on file.   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 Blood pressure normal in the office today but has been uncontrolled at home, as detailed below. She is unsure how much hydralazine she takes.   Patient's home BP has been elevated higher than historical trends this month. Patient had a recent fall and has been in pain. Her husband also severely injured his back recently and is currently in rehab.   Average Systolic BP Level 158.96 mmHg Lowest Systolic BP Level 126 mmHg Highest Systolic BP Level 178 mmHg   02/14/2022 Tuesday at 10:18 AM      172 / 79                02/13/2022 Monday at 07:54 AM       168 / 99                02/12/2022 Sunday at 08:19 AM        167 / 93                02/11/2022 Saturday at 07:45 AM      178 / 96                02/10/2022 Friday at 07:31 AM          166 / 83                02/09/2022 Thursday at 08:47 AM     154 / 81                02/08/2022 Wednesday at 08:26 AM 167 / 89                02/07/2022 Tuesday at 07:23 AM      167 / 88                02/05/2022 Sunday at 08:29 AM        162 / 81                02/04/2022 Saturday at 12:03 PM      165 / 89                02/03/2022 Friday at 08:44 AM          173 / 87                02/02/2022 Thursday at 07:50 AM     14 9 / 82    Current Outpatient Medications:    ACCU-CHEK GUIDE test strip, CHECK BLOOD SUGARS DAILY, Disp: 100 strip, Rfl: 12   Accu-Chek Softclix Lancets lancets, USE AS INSTRUCTED TO TEST SUGARS TWICE A DAY, Disp: 100 each, Rfl: 12   amLODipine (NORVASC) 10 MG tablet, TAKE 1 TABLET BY MOUTH EVERY DAY, Disp: 90 tablet, Rfl:  2   Biotin 1000 MCG tablet, Take 1,000 mcg by mouth daily., Disp: , Rfl:    Blood Glucose Monitoring Suppl (ACCU-CHEK GUIDE) w/Device KIT, 1 each by Does not apply route 2 (two) times daily. To test sugars. Dx. E11.9, Disp: 1 kit, Rfl: 1   Colchicine (MITIGARE) 0.6 MG  CAPS, Take 1 capsule by mouth 2 (two) times daily as needed., Disp: 60 capsule, Rfl: 3   DULoxetine (CYMBALTA) 30 MG capsule, TAKE 3 CAPSULES BY MOUTH EVERY DAY, Disp: 270 capsule, Rfl: 0   enoxaparin (LOVENOX) 60 MG/0.6ML injection, Inject 0.6 mLs (60 mg total) into the skin daily., Disp: 12 mL, Rfl: 0   eszopiclone (LUNESTA) 2 MG TABS tablet, TAKE 1 TABLET (2 MG TOTAL) BY MOUTH IMMEDIATELY BEFORE BEDTIME AS NEEDED FOR SLEEP, Disp: 30 tablet, Rfl: 5   fenofibrate (TRICOR) 48 MG tablet, Take 48 mg by mouth daily., Disp: , Rfl:    ferrous sulfate 325 (65 FE) MG tablet, TAKE 1 TABLET BY MOUTH EVERY DAY, Disp: 90 tablet, Rfl: 1   FLAREX 0.1 % ophthalmic suspension, Apply to eye., Disp: , Rfl:    furosemide (LASIX) 20 MG tablet, TAKE 1 TABLET BY MOUTH EVERY DAY AS NEEDED, Disp: 90 tablet, Rfl: 1   hydrALAZINE (APRESOLINE) 100 MG tablet, TAKE 1 TABLET BY MOUTH TWICE A DAY, Disp: 180 tablet, Rfl: 03   labetalol (NORMODYNE) 200 MG tablet, TAKE 1 TABLET BY MOUTH TWICE A DAY, Disp: 180 tablet, Rfl: 2   levothyroxine (SYNTHROID) 25 MCG tablet, TAKE 1 TABLET BY MOUTH EVERY DAY BEFORE BREAKFAST, Disp: 90 tablet, Rfl: 1   lidocaine (HM LIDOCAINE PATCH) 4 %, Place 1 patch onto the skin daily., Disp: 15 patch, Rfl: 1   linagliptin (TRADJENTA) 5 MG TABS tablet, Take 5 mg by mouth daily., Disp: , Rfl:    loteprednol (LOTEMAX) 0.5 % ophthalmic suspension, SMARTSIG:In Eye(s), Disp: , Rfl:    Magnesium 500 MG TABS, Take 500 mg by mouth daily., Disp: , Rfl:    nitroGLYCERIN (NITROSTAT) 0.4 MG SL tablet, Place 1 tablet (0.4 mg total) under the tongue every 5 (five) minutes as needed for chest pain., Disp: 30 tablet, Rfl: 3   pantoprazole (PROTONIX) 40 MG  tablet, Take 1 tablet (40 mg total) by mouth 2 (two) times daily before a meal., Disp: 60 tablet, Rfl: 2   RESTASIS MULTIDOSE 0.05 % ophthalmic emulsion, Place 1 drop into both eyes 2 (two) times daily., Disp: , Rfl: 6   rosuvastatin (CRESTOR) 20 MG tablet, Take 20 mg by mouth daily., Disp: , Rfl:    tiZANidine (ZANAFLEX) 4 MG tablet, Take 1 tablet (4 mg total) by mouth at bedtime., Disp: 30 tablet, Rfl: 0   valsartan (DIOVAN) 320 MG tablet, TAKE 1 TABLET BY MOUTH EVERY DAY, Disp: 90 tablet, Rfl: 2   Vitamin D, Ergocalciferol, (DRISDOL) 1.25 MG (50000 UNIT) CAPS capsule, TAKE 1 CAPSULE BY MOUTH ONE TIME PER WEEK, Disp: 12 capsule, Rfl: 0   warfarin (COUMADIN) 5 MG tablet, TAKE 1/2 TABLET (2.5 MG) BY MOUTH DAILY EXCEPT TAKE 1 TABLET (5 MG) ON MONDAYS, WEDNESDAYS AND SATURDAYS OR AS DIRECTED BY ANTICOAGULATION CLINIC, Disp: 75 tablet, Rfl: 2  Cardiovascular studies:  EKG 02/15/2022: Sinus rhythm 66 bpm with rate variation  RBBB, LAFB  Renal artery duplex  07/18/2022: No evidence of renal artery occlusive disease in either renal artery. Normal intrarenal vascular perfusion is noted in the right kidney and mild increase in the resistivity index left kidney suggests medico-renal disease.  Renal length is within normal limits for both kidneys. Abdominal aortic atherosclerosis.   Echocardiogram 01/11/2021:  Left ventricle cavity is normal in size. Moderate concentric hypertrophy  of the left ventricle. Normal global wall motion. Normal LV systolic  function with visual EF 55-60%. Doppler evidence of grade I (impaired)  diastolic dysfunction,  normal LAP.  Trace MR, trace TR.  Normal right atrial pressure.  Previous study in 2019 reported mild LA dilatation, mild MR, mild AI.  Lexiscan myoview stress test 01/30/2018: 1. The resting electrocardiogram demonstrated normal sinus rhythm, LAD, LAFB, RBBB and no resting arrhythmias.  The stress electrocardiogram was non-diagnostic due to pharmacologic  stress. Stress symptoms included dyspnea. Resting BP 166/86 and peak BP 202/88 mm Hg.  2. Myocardial perfusion imaging is normal. Overall left ventricular systolic function was normal without regional wall motion abnormalities. The left ventricular ejection fraction was 56%.  This is a low risk study.  Echocardiogram 01/28/2018:  Left ventricle cavity is normal in size. Moderate concentric hypertrophy of the left ventricle. Normal global wall motion. Doppler evidence of grade I (impaired) diastolic dysfunction, normal LAP. Calculated EF 55%. Left atrial cavity is mildly dilated. Mild (Grade I) aortic regurgitation. Mild (Grade I) mitral regrgitation. Trace tricuspid regurgitation. Inadequate tricuspid regurgitation jet to estimate pulmonary artery pressure. Normal right atrial pressure.  Recent labs: 08/01/2021: Glucose 101, BUN/Cr 39/1.92. EGFR 24. Na/K 137/4.5. AlKP 35. Rest of the CMP normal H/H 11/32.8. MCV 93. Platelets 225 HbA1C 5.6% Chol 178, TG 212, HDL 73, LDL 83 TSH 1.5 normal   Review of Systems  Constitutional: Positive for malaise/fatigue.       Difficulty sleeping   Cardiovascular:  Negative for chest pain, dyspnea on exertion, leg swelling, palpitations and syncope.  Musculoskeletal:  Positive for back pain.        There were no vitals filed for this visit.       Objective:   Physical Exam Vitals and nursing note reviewed.  Constitutional:      General: She is not in acute distress. Neck:     Vascular: No JVD.  Cardiovascular:     Rate and Rhythm: Normal rate and regular rhythm.     Pulses: Intact distal pulses.     Heart sounds: Normal heart sounds. No murmur heard.    Comments: Bilateral LE varcocities Pulmonary:     Effort: Pulmonary effort is normal.     Breath sounds: Normal breath sounds. No wheezing or rales.         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 She is unsure how much hydralazine she is taking. Based on her current dose, will likely need to double it.   F/u in 6 months.    Elder Negus, MD Pager: [phone] Office: [phone]

## 2022-07-31 ENCOUNTER — Ambulatory Visit: Payer: Medicare PPO

## 2022-07-31 ENCOUNTER — Telehealth: Payer: Self-pay

## 2022-07-31 NOTE — Telephone Encounter (Signed)
Received VM from pt reporting she cannot make her coumadin clinic apt today due to not feeling well. She requested call back to [facility].   Tried to contact pt but first two times a recording reported "your call cannot be completed as dialed". Subsequent calls just ring as a busy signal.   Will try to contact pt again. Cancelled pt's apt today.

## 2022-07-31 NOTE — Telephone Encounter (Signed)
Contacted pt and [facility] coumadin clinic apt for 5/15. Pt verbalized understanding.

## 2022-08-01 ENCOUNTER — Ambulatory Visit: Payer: Medicare PPO | Admitting: Internal Medicine

## 2022-08-01 ENCOUNTER — Encounter: Payer: Self-pay | Admitting: Internal Medicine

## 2022-08-01 VITALS — BP 102/54 | HR 53 | Resp 16 | Ht 64.0 in | Wt 142.0 lb

## 2022-08-01 DIAGNOSIS — I10 Essential (primary) hypertension: Secondary | ICD-10-CM | POA: Diagnosis not present

## 2022-08-02 ENCOUNTER — Ambulatory Visit (INDEPENDENT_AMBULATORY_CARE_PROVIDER_SITE_OTHER): Payer: Medicare PPO

## 2022-08-02 DIAGNOSIS — Z7901 Long term (current) use of anticoagulants: Secondary | ICD-10-CM | POA: Diagnosis not present

## 2022-08-02 LAB — POCT INR: INR: 3 (ref 2.0–3.0)

## 2022-08-02 NOTE — Patient Instructions (Addendum)
Pre visit review using our clinic review tool, if applicable. No additional management support is needed unless otherwise documented below in the visit note. ? ?Continue 1 tablet daily except take 1/2 tablet on Mondays and Thursdays. Recheck in 4 weeks.  ?

## 2022-08-02 NOTE — Progress Notes (Signed)
Continue 1 tablet daily except take 1/2 tablet on Mondays and Thursdays. Recheck in 4 weeks.  ?

## 2022-08-16 ENCOUNTER — Ambulatory Visit: Payer: Medicare PPO | Admitting: Cardiology

## 2022-08-22 ENCOUNTER — Ambulatory Visit: Payer: Medicare PPO | Admitting: Sports Medicine

## 2022-08-22 ENCOUNTER — Encounter: Payer: Self-pay | Admitting: Sports Medicine

## 2022-08-22 DIAGNOSIS — M19012 Primary osteoarthritis, left shoulder: Secondary | ICD-10-CM

## 2022-08-22 DIAGNOSIS — L989 Disorder of the skin and subcutaneous tissue, unspecified: Secondary | ICD-10-CM

## 2022-08-22 DIAGNOSIS — M25512 Pain in left shoulder: Secondary | ICD-10-CM

## 2022-08-22 DIAGNOSIS — M797 Fibromyalgia: Secondary | ICD-10-CM

## 2022-08-22 DIAGNOSIS — B351 Tinea unguium: Secondary | ICD-10-CM | POA: Diagnosis not present

## 2022-08-22 DIAGNOSIS — G8929 Other chronic pain: Secondary | ICD-10-CM | POA: Diagnosis not present

## 2022-08-22 NOTE — Progress Notes (Signed)
Andrea Simmons - 82 y.o. female MRN [id number]  Date of birth: 01-Jul-1940  Office Visit Note: Visit Date: 08/22/2022 PCP: Sheliah Hatch, MD Referred by: Sheliah Hatch, MD  Subjective: Chief Complaint  Patient presents with   Left Shoulder - Pain   HPI: Andrea Simmons is a pleasant 82 y.o. female who presents today for acute on chronic left shoulder pain. Also with toenail and toe abnormality.  She has been experiencing chronic left shoulder pain.  Did see Dr. Magnus Ivan on 04/19/2022 when getting evaluated with the hip and did receive a subacromial joint injection.  She did have some relief from this but not full relief.  Recently her pain has slightly worsened and she feels like her range of motion is limited as well.  She is on Coumadin and so has to avoid NSAID use.  Has not done any therapy yet to date.  She is managed on Cymbalta 90 mg daily for pain and depression. Hx of fibromyalgia.  Does report a history of unilateral mastectomy with a few lymph node removal, was cautioned on avoiding taking blood pressures and other injections in that area.  She also shows me her feet today which she has thickened nail fungus in all of her nails but most notable in the great toe.  She also has a raised skin lesion on the dorsal aspect of the third toe. Has seen podiatry in the past.  She states she tried to clip the lesion on her toe previously and she had a lot of bleeding given her Coumadin use.  Pertinent ROS were reviewed with the patient and found to be negative unless otherwise specified above in HPI.   Assessment & Plan: Visit Diagnoses:  1. Chronic left shoulder pain   2. Primary osteoarthritis, left shoulder   3. Fibromyalgia   4. Onychomycosis   5. Skin lesion of foot    Plan: Discussed with Bahar the nature of her acute on chronic left shoulder pain, she does have mild arthritic change but it is certainly not advanced.  She does report restricted range of motion and  she does have guarded active range of motion but I am able to take her near full range passively although slowly.  I have less suspicion that this is true frozen shoulder, and think this is more due to rotator cuff arthropathy.  She did get fairly decent relief from previous subacromial joint injection many months ago.  At this point I would like to get her into formalized physical therapy to work on first range of motion, then second work on her strengthening.  Would like to see how her range of motion improves over the coming 6 weeks and then represent for follow-up.  For her shoulder pain as well as her fibromyalgia, she will continue on her Cymbalta 90 mg daily.  In terms of her tiptoes, she does have onychomycosis as well as a keratin like skin lesion, she is failing topical treatments and may need toenail debridement and possibly oral Lamisil.  She is established with podiatry, we will have her follow-up with them for treatment.  Follow-up: Return for 6 weeks after beginning PT of shoulder.   Meds & Orders: No orders of the defined types were placed in this encounter.   Orders Placed This Encounter  Procedures   Ambulatory referral to Physical Therapy     Procedures: No procedures performed      Clinical History: MRI LUMBAR SPINE WITHOUT CONTRAST   TECHNIQUE:  Multiplanar, multisequence MR imaging of the lumbar spine was performed. No intravenous contrast was administered.   COMPARISON:  Prior study from 02/25/2019.   FINDINGS: Segmentation: Standard. Lowest well-formed disc space labeled the L5-S1 level.   Alignment: 6 mm facet mediated anterolisthesis of L4 on L5. Trace 2 mm retrolisthesis of T12 on L1 and L1 and L2.   Vertebrae: Compression deformity of the superior endplate of T11 with superimposed endplate Schmorl's node deformity, partially visualized. There is mild marrow edema at the anterior aspect of the partially visualized superior endplate, which could reflect  a superimposed acute to subacute component (series 4, image 8). No retropulsion. Vertebral body height otherwise maintained with no other acute or chronic fracture. Bone marrow signal intensity within normal limits. No worrisome osseous lesions. No other abnormal marrow edema.   Conus medullaris and cauda equina: Conus extends to the T12 level. Conus and cauda equina appear normal.   Paraspinal and other soft tissues: Unremarkable.   Disc levels:   T12-L1: Retrolisthesis with mild disc bulge and endplate spurring. Mild facet hypertrophy. No canal or foraminal stenosis.   L1-2: Retrolisthesis with mild disc bulge and endplate spurring. Mild facet hypertrophy. No spinal stenosis. Foramina remain patent.   L2-3: Disc desiccation with minimal disc bulge. Mild to moderate facet and ligament flavum hypertrophy. No significant spinal stenosis. Foramina remain patent.   L3-4: Mild degenerative intervertebral disc space narrowing. Disc desiccation with mild disc bulge. Moderate bilateral facet hypertrophy. No significant spinal stenosis. Foramina remain patent.   L4-5: 6 mm facet mediated anterolisthesis. Disc desiccation with associated broad posterior pseudo disc bulge/uncovering. Moderate to advanced bilateral facet arthrosis. Resultant moderate to severe canal with bilateral subarticular stenosis, with mild bilateral L4 foraminal narrowing.   L5-S1: Disc desiccation with mild disc bulge. Superimposed right foraminal disc protrusion with annular fissure contacts the exiting right L5 nerve root (series 8, image 5). Moderate bilateral facet hypertrophy. Resultant mild bilateral subarticular stenosis. Central canal remains patent. Moderate right L5 foraminal narrowing. Left or pharyngeal veins patent.   IMPRESSION: 1. 6 mm facet mediated anterolisthesis of L4 on L5 with resultant moderate to severe canal and bilateral subarticular stenosis, with mild bilateral L4 foraminal  narrowing. 2. Right foraminal disc protrusion at L5-S1, contacting and potentially irritating the exiting right L5 nerve root. 3. Additional mild for age spondylosis elsewhere within the lumbar spine as above. No other significant stenosis or neural impingement. 4. Compression deformity of the superior endplate of T11 with superimposed endplate Schmorl's node deformity, partially visualized. There is mild marrow edema at the anterior aspect of the partially visualized superior endplate, which could reflect a superimposed acute to subacute fracture component. Correlation with physical exam for possible pain at this location recommended.     Electronically Signed   By: Rise Mu M.D.   On: 04/01/2022 19:49  She reports that she has never smoked. She has never used smokeless tobacco.  Recent Labs    02/16/22 1313  HGBA1C 5.7    Objective:   Vital Signs: There were no vitals taken for this visit.  Physical Exam  Gen: Well-appearing, in no acute distress; non-toxic CV: Regular Rate. Well-perfused. Warm.  Resp: Breathing unlabored on room air; no wheezing. Psych: Fluid speech in conversation; appropriate affect; normal thought process Neuro: Sensation intact throughout. No gross coordination deficits.   Ortho Exam - Left shoulder: There is no bony TTP or AC joint TTP.  Patient points to the lateral deltoid.  There is reduced active range of motion  in all directions with forward flexion to about 100 degrees, lateral abduction 95 degrees.  I am able to take her further passively albeit slowly active forward flexion of 150 degrees and lateral abduction of about 130 degrees.  She has equivocal internal range of motion with thumb to approximately T11. + Drop arm test, empty can test, Hawkins impingement testing.  - Right foot: There is notable onychomycosis with evidence of fungal and thickening of toes 1 through 4, with nose notable over the great toe.  Over the dorsal aspect of  the distal phalanx of the third toe there is a raised keratin like lesion that is approximately 1.5 inches off the skin surface, this is mildly tender to palpation, no surrounding redness.  Imaging: Left shoulder XR 01/24/22: 3 views of the left shoulder including Grashey, scapular Y and axillary  views were ordered and reviewed by myself.  X-rays demonstrate no acute  fracture.  There is mild-moderate AC joint arthropathy, there is a small  spur off the inferior aspect of the glenoid, although no significant  glenohumeral joint arthritis.  Humeral head is well located.   Past Medical/Family/Surgical/Social History: Medications & Allergies reviewed per EMR, new medications updated. 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/min (HCC) 08/09/2021   Tear film insufficiency 04/19/2021   Sjogren's disease (HCC) 01/24/2021   Erroneous encounter - disregard 01/21/2021   Abnormal gait 12/10/2020   Hereditary and idiopathic neuropathy, unspecified 12/10/2020   Major depressive disorder, single episode, unspecified 12/10/2020   Osteoarthritis 12/10/2020   Esophageal dysphagia 09/16/2020   Dehydration 09/16/2020   AKI (acute kidney injury) (HCC) 09/16/2020   Elevated lipase 09/16/2020   Hyperglycemia due to diabetes mellitus (HCC) 09/16/2020   Vitamin D deficiency 10/01/2019   Polypharmacy 10/01/2019   Major depressive disorder, recurrent episode, moderate (HCC) 10/01/2019   Leg edema 06/04/2019   Chest pain of uncertain etiology 12/25/2018   Hx of pulmonary embolus 03/30/2017   Physical exam 11/30/2016   Hyperlipidemia 12/26/2013   Encounter for therapeutic drug monitoring 04/16/2013   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   Long term (current) use of anticoagulants 06/30/2010   Exertional dyspnea 11/09/2008   Diabetes type 2, controlled (HCC) 08/27/2008   ANEMIA-NOS 08/27/2008   Seasonal and  perennial allergic rhinitis 08/27/2008   PULMONARY NODULE 08/27/2008   Fibromyalgia 08/27/2008   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 (HCC) 03/20/2007   Hypothyroidism 03/20/2007   Essential hypertension 03/20/2007   Recurrent pulmonary emboli (HCC) 03/20/2007   GERD 03/20/2007   ESOPHAGEAL STRICTURE 03/08/2007   Past Medical History:  Diagnosis Date   Anemia    Anxiety    Breast cancer (HCC)    Depression    Diabetes mellitus type II    Fibromyalgia    GERD (gastroesophageal reflux disease)    Hyperlipidemia    Hypertension    Malignant neoplasm of breast (female), unspecified site 1993   L breast s/p mastectomy and tamoxifen x 39yrs   Other pulmonary embolism and infarction 2008 and 2009   chronic anticoag - [facility]   Unspecified hypothyroidism    Family History  Problem Relation Age of Onset   Depression Other        Parent   Arthritis Other        Parent, Grandparent   Hypertension Other        Grandparent   Hyperlipidemia Other  [facility]   Miscarriages / [place] Other        Grandparent   Stroke Other        [facility]   Cancer Maternal Uncle        prostate   Hypertension Maternal Grandfather    Breast cancer Cousin    Breast cancer Cousin    Past Surgical History:  Procedure Laterality Date   APPENDECTOMY     BALLOON DILATION N/A 10/18/2020   Procedure: BALLOON DILATION;  Surgeon: Lanelle Bal, DO;  Location: [facility];  Service: Endoscopy;  Laterality: N/A;   BIOPSY  10/18/2020   Procedure: BIOPSY;  Surgeon: Lanelle Bal, DO;  Location: [facility];  Service: Endoscopy;;  gastric    BREAST BIOPSY Left 1993   BREAST BIOPSY Right 09/25/2014   stero. Benign   BREAST RECONSTRUCTION Left    CATARACT EXTRACTION     ESOPHAGOGASTRODUODENOSCOPY (EGD) WITH PROPOFOL N/A 10/18/2020   Procedure: ESOPHAGOGASTRODUODENOSCOPY (EGD) WITH PROPOFOL;  Surgeon: Lanelle Bal, DO;  Location: [facility];  Service: Endoscopy;  Laterality: N/A;  11:00am    MASTECTOMY Left    ROTATOR CUFF REPAIR     SPINAL FUSION      x 2   TOTAL ABDOMINAL HYSTERECTOMY W/ BILATERAL SALPINGOOPHORECTOMY     TUBAL LIGATION     Social History   Occupational History   Occupation: Software engineer: RETIRED   Occupation: Producer, television/film/video   Occupation: school bus driver  Tobacco Use   Smoking status: Never   Smokeless tobacco: Never  Building services engineer Use: Never used  Substance and Sexual Activity   Alcohol use: No    Alcohol/week: 0.0 standard drinks of alcohol   Drug use: No   Sexual activity: Not Currently

## 2022-08-22 NOTE — Progress Notes (Signed)
Left shoulder pain Had injection, that may have helped some States she is not supposed to get injections on that side due to history of Breast Cancer Limited ROM due to it feeling "stuck"

## 2022-08-23 DIAGNOSIS — I10 Essential (primary) hypertension: Secondary | ICD-10-CM | POA: Diagnosis not present

## 2022-08-30 ENCOUNTER — Other Ambulatory Visit: Payer: Self-pay | Admitting: Cardiology

## 2022-08-30 ENCOUNTER — Other Ambulatory Visit: Payer: Self-pay | Admitting: Family Medicine

## 2022-08-30 ENCOUNTER — Ambulatory Visit (INDEPENDENT_AMBULATORY_CARE_PROVIDER_SITE_OTHER): Payer: Medicare PPO

## 2022-08-30 DIAGNOSIS — Z7901 Long term (current) use of anticoagulants: Secondary | ICD-10-CM | POA: Diagnosis not present

## 2022-08-30 DIAGNOSIS — I10 Essential (primary) hypertension: Secondary | ICD-10-CM

## 2022-08-30 LAB — POCT INR: INR: 4.2 — AB (ref 2.0–3.0)

## 2022-08-30 NOTE — Patient Instructions (Addendum)
Pre visit review using our clinic review tool, if applicable. No additional management support is needed unless otherwise documented below in the visit note.  Hold dose today and tomorrow and then change weekly dosing to take  1 tablet daily except take 1/2 tablet on Mondays, Wednesdays and Fridays. Recheck in 2 weeks.

## 2022-08-30 NOTE — Progress Notes (Signed)
Pt reports BLL edema a few days last week but it has gotten better in the last two days. She is gong to contact cardiology concerning the edema.   Hold dose today and tomorrow and the change weekly dosing to take  1 tablet daily except take 1/2 tablet on Mondays, Wednesdays and Fridays. Recheck in 2 weeks.

## 2022-08-31 ENCOUNTER — Ambulatory Visit: Payer: Medicare PPO | Admitting: Physical Therapy

## 2022-09-04 ENCOUNTER — Encounter: Payer: Self-pay | Admitting: Sports Medicine

## 2022-09-06 ENCOUNTER — Ambulatory Visit: Payer: Medicare PPO | Admitting: Physical Therapy

## 2022-09-11 ENCOUNTER — Encounter: Payer: Self-pay | Admitting: Physical Therapy

## 2022-09-11 ENCOUNTER — Ambulatory Visit: Payer: Medicare PPO | Attending: Sports Medicine | Admitting: Physical Therapy

## 2022-09-11 ENCOUNTER — Other Ambulatory Visit: Payer: Self-pay

## 2022-09-11 DIAGNOSIS — M19012 Primary osteoarthritis, left shoulder: Secondary | ICD-10-CM | POA: Diagnosis not present

## 2022-09-11 DIAGNOSIS — G8929 Other chronic pain: Secondary | ICD-10-CM | POA: Diagnosis not present

## 2022-09-11 DIAGNOSIS — M25512 Pain in left shoulder: Secondary | ICD-10-CM | POA: Insufficient documentation

## 2022-09-11 NOTE — Therapy (Signed)
OUTPATIENT PHYSICAL THERAPY SHOULDER EVALUATION   Patient Name: Andrea Simmons MRN: [id number] DOB:Dec 13, 1940, 82 y.o., female Today's Date: 09/11/2022  END OF SESSION:  PT End of Session - 09/11/22 1328     Visit Number 1    Number of Visits 12    Date for PT Re-Evaluation 10/23/22    Authorization Type FOTO.    PT Start Time 0100    PT Stop Time 0144    PT Time Calculation (min) 44 min    Activity Tolerance Patient tolerated treatment well    Behavior During Therapy WFL for tasks assessed/performed             Past Medical History:  Diagnosis Date   Anemia    Anxiety    Breast cancer (HCC)    Depression    Diabetes mellitus type II    Fibromyalgia    GERD (gastroesophageal reflux disease)    Hyperlipidemia    Hypertension    Malignant neoplasm of breast (female), unspecified site 1993   L breast s/p mastectomy and tamoxifen x 87yrs   Other pulmonary embolism and infarction 2008 and 2009   chronic anticoag - [facility]   Unspecified hypothyroidism    Past Surgical History:  Procedure Laterality Date   APPENDECTOMY     BALLOON DILATION N/A 10/18/2020   Procedure: BALLOON DILATION;  Surgeon: Lanelle Bal, DO;  [facility];  Service: Endoscopy;  Laterality: N/A;   BIOPSY  10/18/2020   Procedure: BIOPSY;  Surgeon: Lanelle Bal, DO;  [facility]: [facility];  Service: Endoscopy;;  gastric    BREAST BIOPSY Left 1993   BREAST BIOPSY Right 09/25/2014   stero. Benign   BREAST RECONSTRUCTION Left    CATARACT EXTRACTION     ESOPHAGOGASTRODUODENOSCOPY (EGD) WITH PROPOFOL N/A 10/18/2020   Procedure: ESOPHAGOGASTRODUODENOSCOPY (EGD) WITH PROPOFOL;  Surgeon: Lanelle Bal, DO;  Location: [facility];  Service: Endoscopy;  Laterality: N/A;  11:00am   MASTECTOMY Left    ROTATOR CUFF REPAIR     SPINAL FUSION      x 2   TOTAL ABDOMINAL HYSTERECTOMY W/ BILATERAL SALPINGOOPHORECTOMY     TUBAL LIGATION     Patient Active Problem List   Diagnosis Date  Noted   CKD (chronic kidney disease) stage 4, GFR 15-29 ml/min (HCC) 08/09/2021   Tear film insufficiency 04/19/2021   Sjogren's disease (HCC) 01/24/2021   Erroneous encounter - disregard 01/21/2021   Abnormal gait 12/10/2020   Hereditary and idiopathic neuropathy, unspecified 12/10/2020   Major depressive disorder, single episode, unspecified 12/10/2020   Osteoarthritis 12/10/2020   Esophageal dysphagia 09/16/2020   Dehydration 09/16/2020   AKI (acute kidney injury) (HCC) 09/16/2020   Elevated lipase 09/16/2020   Hyperglycemia due to diabetes mellitus (HCC) 09/16/2020   Vitamin D deficiency 10/01/2019   Polypharmacy 10/01/2019   Major depressive disorder, recurrent episode, moderate (HCC) 10/01/2019   Leg edema 06/04/2019   Chest pain of uncertain etiology 12/25/2018   Hx of pulmonary embolus 03/30/2017   Physical exam 11/30/2016   Hyperlipidemia 12/26/2013   Encounter for therapeutic drug monitoring 04/16/2013   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   Long term (current) use of anticoagulants 06/30/2010   Exertional dyspnea 11/09/2008   Diabetes type 2, controlled (HCC) 08/27/2008   ANEMIA-NOS 08/27/2008   Seasonal and perennial allergic rhinitis 08/27/2008   PULMONARY NODULE 08/27/2008   Fibromyalgia 08/27/2008   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 (HCC) 03/20/2007  Hypothyroidism 03/20/2007   Essential hypertension 03/20/2007   Recurrent pulmonary emboli (HCC) 03/20/2007   GERD 03/20/2007   ESOPHAGEAL STRICTURE 03/08/2007    REFERRING PROVIDER: Madelyn Brunner  REFERRING DIAG: Chronic left shoulder pain.  THERAPY DIAG:  Chronic left shoulder pain - Plan: PT plan of care cert/re-cert  Primary osteoarthritis, left shoulder - Plan: PT plan of care cert/re-cert  Rationale for Evaluation and Treatment: Rehabilitation  ONSET DATE: January 2024.  SUBJECTIVE:                                                                                                                                                                                       SUBJECTIVE STATEMENT: The patient presents to the clinic with c/o left shoulder pain and loss of movement since January of this year which she believes is due to a fall.  She has been doing wall climbs over the last few weeks and states her range of motion has already improved quite a bit.  Her resting pain-level today is a 4/10 but can become very painful with certain movements.    PERTINENT HISTORY: DM, fibromyalgia, hypothyroidism, right RCR, neck surgery.  PAIN:  Are you having pain? Yes: NPRS scale: 4/10 Pain location: Left shoulder. Pain description: Achy and sore. Aggravating factors: "Lifting it up." Relieving factors: "Not using it much"  PRECAUTIONS: None  WEIGHT BEARING RESTRICTIONS: No  FALLS:  Has patient fallen in last 6 months? Yes. Number of falls "not sure"..1 in January though.  LIVING ENVIRONMENT: Lives in: House/apartment Has following equipment at home:  None.  OCCUPATION: Retired.  PLOF: Independent  PATIENT GOALS: Use left shoulder without pain. Regain motion.  NEXT MD VISIT:   OBJECTIVE:   PATIENT SURVEYS:  FOTO .  POSTURE: Forward head and rounded shoulders.   UPPER EXTREMITY ROM:   Active left shoulder flexion against gravity to 105 degrees and passive to 115 degrees, ER is 60 degrees, abduction is 45  and behind back to T8.  UPPER EXTREMITY MMT:  Left shoulder IR/ER tested with elbow at side and graded at 4/5.     SHOULDER SPECIAL TESTS: Pain reproduction with left shoulder Impingement testing.  PALPATION:  Tender to palpation over left bicipital groove and posterior cuff region.   TODAY'S TREATMENT:  DATE: HMP and IFC at 80-150 Hz on 40% scan x 15 minutes to patient's left affected shoulder region.   Normal modality response following removal of modality.  PATIENT EDUCATION: Education details: Reviewed wall climb and instructed in corner stretch. Person educated: Patient Education method: Medical illustrator Education comprehension: verbalized understanding and returned demonstration  HOME EXERCISE PROGRAM: As above.  ASSESSMENT:  CLINICAL IMPRESSION: The patient presents to [facility] with c/o chronic left shoulder pain and loss of range of motion since January of this year.  She has been working on a home exercise and it has been helping a lot.  She still lacks some flexion, abduction and ER. She demonstrates a positive left shoulder Impingement test. She is tender to palpation over her left bicipital groove and posterior cuff region.  Patient will benefit from skilled physical therapy intervention to address pain and deficits.  OBJECTIVE IMPAIRMENTS: decreased ROM, decreased strength, increased muscle spasms, postural dysfunction, and pain.   ACTIVITY LIMITATIONS: carrying, lifting, and reach over head  PARTICIPATION LIMITATIONS: meal prep, cleaning, and laundry  PERSONAL FACTORS: Time since onset of injury/illness/exacerbation and 1-2 comorbidities: DM, hypothyroidism  are also affecting patient's functional outcome.   REHAB POTENTIAL: Excellent  CLINICAL DECISION MAKING: Stable/uncomplicated  EVALUATION COMPLEXITY: Low   GOALS:  LONG TERM GOALS: Target date: 10/23/22.  Ind with a HEP.  Goal status: INITIAL  2.  Active left shoulder flexion to 145 degrees so the patient can easily reach overhead.  Goal status: INITIAL  3.  Active ER to 80 degrees+ to allow for easily donning/doffing of apparel.  Goal status: INITIAL  4.  Increase left shoulder strength to a solid 4+ to 5/5 to increase stability for performance of functional activities.  Goal status: INITIAL  5.  Perform ADL's with pain not > 3/10.  Goal status: INITIAL  PLAN:  PT FREQUENCY: 2x/week  PT  DURATION: 6 weeks  PLANNED INTERVENTIONS: Therapeutic exercises, Therapeutic activity, Patient/Family education, Self Care, Dry Needling, Electrical stimulation, Cryotherapy, Moist heat, Vasopneumatic device, Ultrasound, and Manual therapy  PLAN FOR NEXT SESSION: Combo e'stim/US, STW/M, wall ladder, UE Ranger, pulleys.     Jonia Oakey, [place], PT 09/11/2022, 2:04 PM

## 2022-09-12 ENCOUNTER — Encounter: Payer: Self-pay | Admitting: Internal Medicine

## 2022-09-13 ENCOUNTER — Ambulatory Visit (INDEPENDENT_AMBULATORY_CARE_PROVIDER_SITE_OTHER): Payer: Medicare PPO

## 2022-09-13 DIAGNOSIS — Z7901 Long term (current) use of anticoagulants: Secondary | ICD-10-CM | POA: Diagnosis not present

## 2022-09-13 LAB — POCT INR: INR: 2.2 (ref 2.0–3.0)

## 2022-09-13 NOTE — Progress Notes (Signed)
Continue 1 tablet daily except take 1/2 tablet on Mondays, Wednesdays and Fridays. Recheck in 6 weeks.

## 2022-09-13 NOTE — Patient Instructions (Addendum)
Pre visit review using our clinic review tool, if applicable. No additional management support is needed unless otherwise documented below in the visit note.  Continue 1 tablet daily except take 1/2 tablet on Mondays, Wednesdays and Fridays. Recheck in 6 weeks.

## 2022-09-14 ENCOUNTER — Ambulatory Visit: Payer: Medicare PPO | Admitting: Physical Therapy

## 2022-09-14 ENCOUNTER — Encounter: Payer: Self-pay | Admitting: Physical Therapy

## 2022-09-14 DIAGNOSIS — M19012 Primary osteoarthritis, left shoulder: Secondary | ICD-10-CM | POA: Diagnosis not present

## 2022-09-14 DIAGNOSIS — M25512 Pain in left shoulder: Secondary | ICD-10-CM | POA: Diagnosis not present

## 2022-09-14 DIAGNOSIS — G8929 Other chronic pain: Secondary | ICD-10-CM | POA: Diagnosis not present

## 2022-09-14 NOTE — Therapy (Addendum)
OUTPATIENT PHYSICAL THERAPY SHOULDER TREATMENT   Patient Name: GEAN LAURSEN MRN: [id number] DOB:1940/12/01, 82 y.o., female Today's Date: 09/14/2022  END OF SESSION:  PT End of Session - 09/14/22 1519     Visit Number 2    Number of Visits 12    Date for PT Re-Evaluation 10/23/22    Authorization Type FOTO.    PT Start Time 1516    PT Stop Time 1559    PT Time Calculation (min) 43 min    Activity Tolerance Patient tolerated treatment well    Behavior During Therapy WFL for tasks assessed/performed            Past Medical History:  Diagnosis Date   Anemia    Anxiety    Breast cancer (HCC)    Depression    Diabetes mellitus type II    Fibromyalgia    GERD (gastroesophageal reflux disease)    Hyperlipidemia    Hypertension    Malignant neoplasm of breast (female), unspecified site 1993   L breast s/p mastectomy and tamoxifen x 28yrs   Other pulmonary embolism and infarction 2008 and 2009   chronic anticoag - [facility]   Unspecified hypothyroidism    Past Surgical History:  Procedure Laterality Date   APPENDECTOMY     BALLOON DILATION N/A 10/18/2020   Procedure: BALLOON DILATION;  Surgeon: Lanelle Bal, DO;  [facility];  Service: Endoscopy;  Laterality: N/A;   BIOPSY  10/18/2020   Procedure: BIOPSY;  Surgeon: Lanelle Bal, DO;  [facility];  Service: Endoscopy;;  gastric    BREAST BIOPSY Left 1993   BREAST BIOPSY Right 09/25/2014   stero. Benign   BREAST RECONSTRUCTION Left    CATARACT EXTRACTION     ESOPHAGOGASTRODUODENOSCOPY (EGD) WITH PROPOFOL N/A 10/18/2020   Procedure: ESOPHAGOGASTRODUODENOSCOPY (EGD) WITH PROPOFOL;  Surgeon: Lanelle Bal, DO;  [facility]: [facility];  Service: Endoscopy;  Laterality: N/A;  11:00am   MASTECTOMY Left    ROTATOR CUFF REPAIR     SPINAL FUSION      x 2   TOTAL ABDOMINAL HYSTERECTOMY W/ BILATERAL SALPINGOOPHORECTOMY     TUBAL LIGATION     Patient Active Problem List   Diagnosis Date Noted    CKD (chronic kidney disease) stage 4, GFR 15-29 ml/min (HCC) 08/09/2021   Tear film insufficiency 04/19/2021   Sjogren's disease (HCC) 01/24/2021   Erroneous encounter - disregard 01/21/2021   Abnormal gait 12/10/2020   Hereditary and idiopathic neuropathy, unspecified 12/10/2020   Major depressive disorder, single episode, unspecified 12/10/2020   Osteoarthritis 12/10/2020   Esophageal dysphagia 09/16/2020   Dehydration 09/16/2020   AKI (acute kidney injury) (HCC) 09/16/2020   Elevated lipase 09/16/2020   Hyperglycemia due to diabetes mellitus (HCC) 09/16/2020   Vitamin D deficiency 10/01/2019   Polypharmacy 10/01/2019   Major depressive disorder, recurrent episode, moderate (HCC) 10/01/2019   Leg edema 06/04/2019   Chest pain of uncertain etiology 12/25/2018   Hx of pulmonary embolus 03/30/2017   Physical exam 11/30/2016   Hyperlipidemia 12/26/2013   Encounter for therapeutic drug monitoring 04/16/2013   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   Long term (current) use of anticoagulants 06/30/2010   Exertional dyspnea 11/09/2008   Diabetes type 2, controlled (HCC) 08/27/2008   ANEMIA-NOS 08/27/2008   Seasonal and perennial allergic rhinitis 08/27/2008   PULMONARY NODULE 08/27/2008   Fibromyalgia 08/27/2008   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 (HCC) 03/20/2007   Hypothyroidism  03/20/2007   Essential hypertension 03/20/2007   Recurrent pulmonary emboli (HCC) 03/20/2007   GERD 03/20/2007   ESOPHAGEAL STRICTURE 03/08/2007   REFERRING PROVIDER: Madelyn Brunner  REFERRING DIAG: Chronic left shoulder pain.  THERAPY DIAG:  Chronic left shoulder pain  Primary osteoarthritis, left shoulder  Rationale for Evaluation and Treatment: Rehabilitation  ONSET DATE: January 2024.  SUBJECTIVE:                                                                                                                                                                                       SUBJECTIVE STATEMENT: States that her shoulder feels better and has tried reaching more.  PERTINENT HISTORY: DM, fibromyalgia, hypothyroidism, right RCR, neck surgery.  PAIN:  Are you having pain? Yes: NPRS scale: no rating provided/10 Pain location: Left shoulder. Pain description: Achy and sore. Aggravating factors: "Lifting it up." Relieving factors: "Not using it much"  PRECAUTIONS: None  WEIGHT BEARING RESTRICTIONS: No  FALLS:  Has patient fallen in last 6 months? Yes. Number of falls "not sure"..1 in January though.  PATIENT GOALS: Use left shoulder without pain. Regain motion.  NEXT MD VISIT:   OBJECTIVE:   PATIENT SURVEYS:  FOTO .  POSTURE: Forward head and rounded shoulders.  UPPER EXTREMITY ROM:  Active left shoulder flexion against gravity to 105 degrees and passive to 115 degrees, ER is 60 degrees, abduction is 45  and behind back to T8.  UPPER EXTREMITY MMT: Left shoulder IR/ER tested with elbow at side and graded at 4/5.    SHOULDER SPECIAL TESTS: Pain reproduction with left shoulder Impingement testing.  PALPATION:  Tender to palpation over left bicipital groove and posterior cuff region.   TODAY'S TREATMENT:                                                                                                                                         DATE: 09/14/2022  EXERCISE LOG  Exercise Repetitions and Resistance Comments  Pulley X5 min   Wall ladder X5 reps Max 24-25  UE ranger  Standing into flexion x20 reps   Ball rolls- table Flexion x30 reps with stretch   Wall wash X 2 min    Blank cell = exercise not performed today   Modalities  Date: 09/14/22 Ultrasound: Shoulder, 1.5 w/cm2, pulsed, 10 mins, Pain  Manual Therapy Soft Tissue Mobilization: R deltoids, reduce discomfort and mild tone    PATIENT EDUCATION: Education details: Reviewed wall climb and instructed in corner stretch. Person educated: Patient Education  method: Medical illustrator Education comprehension: verbalized understanding and returned demonstration  HOME EXERCISE PROGRAM: As above.  ASSESSMENT:  CLINICAL IMPRESSION: Patient presented in clinic with reports of improvement with ROM. Patient did report a pull with wall ladder stretch today but none with seated pulleys. Primary focus on all exercises was regaining WFL shoulder ROM. No other complaints of pain reported during treatment. Mild tone of the R deltoids noted with manual therapy but no complaints of any pain with manual therapy. Normal [place] response noted following end of session. Patient had no negetive complaints following end of treatment.  OBJECTIVE IMPAIRMENTS: decreased ROM, decreased strength, increased muscle spasms, postural dysfunction, and pain.   ACTIVITY LIMITATIONS: carrying, lifting, and reach over head  PARTICIPATION LIMITATIONS: meal prep, cleaning, and laundry  PERSONAL FACTORS: Time since onset of injury/illness/exacerbation and 1-2 comorbidities: DM, hypothyroidism  are also affecting patient's functional outcome.   REHAB POTENTIAL: Excellent  CLINICAL DECISION MAKING: Stable/uncomplicated  EVALUATION COMPLEXITY: Low   GOALS:  LONG TERM GOALS: Target date: 10/23/22.  Ind with a HEP.  Goal status: INITIAL  2.  Active left shoulder flexion to 145 degrees so the patient can easily reach overhead.  Goal status: INITIAL  3.  Active ER to 80 degrees+ to allow for easily donning/doffing of apparel.  Goal status: INITIAL  4.  Increase left shoulder strength to a solid 4+ to 5/5 to increase stability for performance of functional activities.  Goal status: INITIAL  5.  Perform ADL's with pain not > 3/10.  Goal status: INITIAL  PLAN:  PT FREQUENCY: 2x/week  PT DURATION: 6 weeks  PLANNED INTERVENTIONS: Therapeutic exercises, Therapeutic activity, Patient/Family education, Self Care, Dry Needling, Electrical stimulation, Cryotherapy,  Moist heat, Vasopneumatic device, Ultrasound, and Manual therapy  PLAN FOR NEXT SESSION: Combo e'stim/US, STW/M, wall ladder, UE Ranger, pulleys.    Marvell Fuller, PTA 09/14/2022, 4:26 PM

## 2022-09-15 ENCOUNTER — Ambulatory Visit: Payer: Medicare PPO | Admitting: Podiatry

## 2022-09-15 DIAGNOSIS — M2041 Other hammer toe(s) (acquired), right foot: Secondary | ICD-10-CM

## 2022-09-19 ENCOUNTER — Encounter: Payer: Medicare PPO | Admitting: Physical Therapy

## 2022-09-20 ENCOUNTER — Other Ambulatory Visit: Payer: Self-pay | Admitting: Psychiatry

## 2022-09-20 DIAGNOSIS — F411 Generalized anxiety disorder: Secondary | ICD-10-CM

## 2022-09-20 DIAGNOSIS — F4001 Agoraphobia with panic disorder: Secondary | ICD-10-CM

## 2022-09-20 DIAGNOSIS — F331 Major depressive disorder, recurrent, moderate: Secondary | ICD-10-CM

## 2022-09-20 DIAGNOSIS — F338 Other recurrent depressive disorders: Secondary | ICD-10-CM

## 2022-09-25 ENCOUNTER — Ambulatory Visit: Payer: Medicare PPO | Admitting: Physical Therapy

## 2022-09-26 ENCOUNTER — Institutional Professional Consult (permissible substitution): Payer: Medicare PPO | Admitting: Internal Medicine

## 2022-09-27 ENCOUNTER — Encounter: Payer: Medicare PPO | Admitting: Physical Therapy

## 2022-09-28 ENCOUNTER — Ambulatory Visit: Payer: Medicare PPO | Admitting: Psychiatry

## 2022-09-28 ENCOUNTER — Encounter (HOSPITAL_BASED_OUTPATIENT_CLINIC_OR_DEPARTMENT_OTHER): Payer: Self-pay | Admitting: Family Medicine

## 2022-09-28 ENCOUNTER — Ambulatory Visit (HOSPITAL_BASED_OUTPATIENT_CLINIC_OR_DEPARTMENT_OTHER): Payer: Medicare PPO | Admitting: Family Medicine

## 2022-09-28 VITALS — BP 117/64 | HR 58 | Ht 64.0 in | Wt 138.2 lb

## 2022-09-28 DIAGNOSIS — N184 Chronic kidney disease, stage 4 (severe): Secondary | ICD-10-CM

## 2022-09-28 DIAGNOSIS — Z7901 Long term (current) use of anticoagulants: Secondary | ICD-10-CM

## 2022-09-28 DIAGNOSIS — E782 Mixed hyperlipidemia: Secondary | ICD-10-CM | POA: Diagnosis not present

## 2022-09-28 DIAGNOSIS — F339 Major depressive disorder, recurrent, unspecified: Secondary | ICD-10-CM | POA: Diagnosis not present

## 2022-09-28 DIAGNOSIS — E039 Hypothyroidism, unspecified: Secondary | ICD-10-CM

## 2022-09-28 DIAGNOSIS — Z7689 Persons encountering health services in other specified circumstances: Secondary | ICD-10-CM

## 2022-09-28 DIAGNOSIS — I2699 Other pulmonary embolism without acute cor pulmonale: Secondary | ICD-10-CM

## 2022-09-28 DIAGNOSIS — C50912 Malignant neoplasm of unspecified site of left female breast: Secondary | ICD-10-CM

## 2022-09-28 DIAGNOSIS — I452 Bifascicular block: Secondary | ICD-10-CM

## 2022-09-28 DIAGNOSIS — I10 Essential (primary) hypertension: Secondary | ICD-10-CM

## 2022-09-28 DIAGNOSIS — E119 Type 2 diabetes mellitus without complications: Secondary | ICD-10-CM | POA: Diagnosis not present

## 2022-09-28 NOTE — Progress Notes (Signed)
New Patient Office Visit  Subjective    Patient ID: Andrea Simmons, female    DOB: 06/16/1940  Age: 82 y.o. 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 Recently celebrated 60th wedding anniversary.   Recently saw a podiatrist for wart removable on 3rd toe on her R foot. Patient states it was "almost an inch tall."  Skin broke earlier this morning- not painful currently but redness around wart   Reports she experiences multiple falls per year due to balance.   Cardiology  HTN- amlodipine 10mg  daily; hydralazine 100mg  twice daily; labetalol 200mg  twice daily; valsartan 320mg  daily LAFB & RBBB NSR 60s Renal artery duplex 07/18/2022- no renal artery occlusive disease in either renal artery  Abd aortic atherosclerosis.  Echo 12/2020: EF 55-60%  Nephrology- [facility]  CKD, stage IV  Endocrinologist- [facility]  DMII  HLD- rosuvastatin 20mg  daily  Pulmonology- PE  INR- does at [facility] with nurse Chronic anticoagulation   Depression & fibromyalgia- Cymbalta 90mg  daily  Followed by psychiatry- since 2020   Outpatient Encounter Medications as of 09/28/2022  Medication Sig   ACCU-CHEK GUIDE test strip CHECK BLOOD SUGARS DAILY   Accu-Chek Softclix Lancets lancets USE AS INSTRUCTED TO TEST SUGARS TWICE A DAY   amLODipine (NORVASC) 10 MG tablet TAKE 1 TABLET BY MOUTH EVERY DAY   Biotin 1000 MCG tablet Take 1,000 mcg by mouth daily.   Blood Glucose Monitoring Suppl (ACCU-CHEK GUIDE) w/Device KIT 1 each by Does not apply route 2 (two) times daily. To test sugars. Dx. E11.9   Colchicine (MITIGARE) 0.6 MG CAPS Take 1 capsule by mouth 2 (two) times daily as needed.   DULoxetine (CYMBALTA) 30 MG capsule TAKE 3 CAPSULES BY MOUTH EVERY DAY    enoxaparin (LOVENOX) 60 MG/0.6ML injection Inject 0.6 mLs (60 mg total) into the skin daily.   eszopiclone (LUNESTA) 2 MG TABS tablet TAKE 1 TABLET (2 MG TOTAL) BY MOUTH IMMEDIATELY BEFORE BEDTIME AS NEEDED FOR SLEEP   fenofibrate (TRICOR) 48 MG tablet Take 48 mg by mouth daily.   ferrous sulfate 325 (65 FE) MG tablet TAKE 1 TABLET BY MOUTH EVERY DAY   FLAREX 0.1 % ophthalmic suspension Apply to eye.   furosemide (LASIX) 20 MG tablet TAKE 1 TABLET BY MOUTH EVERY DAY AS NEEDED   hydrALAZINE (APRESOLINE) 100 MG tablet TAKE 1 TABLET BY MOUTH TWICE A DAY   labetalol (NORMODYNE) 200 MG tablet TAKE 1 TABLET BY MOUTH TWICE A DAY   levothyroxine (SYNTHROID) 25 MCG tablet TAKE 1 TABLET BY MOUTH EVERY DAY BEFORE BREAKFAST   lidocaine (HM LIDOCAINE PATCH) 4 % Place 1 patch onto the skin daily.   linagliptin (TRADJENTA) 5 MG TABS tablet Take 5 mg by mouth daily.   loteprednol (LOTEMAX) 0.5 % ophthalmic suspension SMARTSIG:In Eye(s)   Magnesium 500 MG TABS Take 500 mg by mouth daily.   nitroGLYCERIN (NITROSTAT) 0.4 MG SL tablet Place 1 tablet (0.4 mg total) under the tongue every 5 (five) minutes as needed for chest pain.   pantoprazole (PROTONIX) 40 MG tablet Take 1 tablet (40 mg total) by mouth 2 (two) times daily before a meal.   rosuvastatin (CRESTOR) 20 MG tablet Take 20 mg by mouth daily.   tiZANidine (ZANAFLEX) 4 MG tablet Take 1 tablet (4 mg total) by  mouth at bedtime.   valsartan (DIOVAN) 320 MG tablet TAKE 1 TABLET BY MOUTH EVERY DAY   Vitamin D, Ergocalciferol, (DRISDOL) 1.25 MG (50000 UNIT) CAPS capsule TAKE 1 CAPSULE BY MOUTH ONE TIME PER WEEK   warfarin (COUMADIN) 5 MG tablet TAKE 1/2 TABLET (2.5 MG) BY MOUTH DAILY EXCEPT TAKE 1 TABLET (5 MG) ON MONDAYS, WEDNESDAYS AND SATURDAYS OR AS DIRECTED BY ANTICOAGULATION CLINIC   No facility-administered encounter medications on file as of 09/28/2022.    Past Medical History:  Diagnosis Date   Anemia    Anxiety    Breast cancer (HCC)     Depression    Diabetes mellitus type II    Fibromyalgia    GERD (gastroesophageal reflux disease)    Hyperlipidemia    Hypertension    Malignant neoplasm of breast (female), unspecified site 1993   L breast s/p mastectomy and tamoxifen x 103yrs   Other pulmonary embolism and infarction 2008 and 2009   chronic anticoag - [facility]   Unspecified hypothyroidism     Past Surgical History:  Procedure Laterality Date   APPENDECTOMY     BALLOON DILATION N/A 10/18/2020   Procedure: BALLOON DILATION;  Surgeon: Lanelle Bal, DO;  [facility];  Service: Endoscopy;  Laterality: N/A;   BIOPSY  10/18/2020   Procedure: BIOPSY;  Surgeon: Lanelle Bal, DO;  [facility];  Service: Endoscopy;;  gastric    BREAST BIOPSY Left 1993   BREAST BIOPSY Right 09/25/2014   stero. Benign   BREAST RECONSTRUCTION Left    CATARACT EXTRACTION     ESOPHAGOGASTRODUODENOSCOPY (EGD) WITH PROPOFOL N/A 10/18/2020   Procedure: ESOPHAGOGASTRODUODENOSCOPY (EGD) WITH PROPOFOL;  Surgeon: Lanelle Bal, DO;  [facility];  Service: Endoscopy;  Laterality: N/A;  11:00am   MASTECTOMY Left    ROTATOR CUFF REPAIR     SPINAL FUSION      x 2   TOTAL ABDOMINAL HYSTERECTOMY W/ BILATERAL SALPINGOOPHORECTOMY     TUBAL LIGATION      Family History  Problem Relation Age of Onset   Depression Other        Parent   Arthritis Other        Parent, Grandparent   Hypertension Other        Grandparent   Hyperlipidemia Other        FMH   Miscarriages / Stillbirths Other        Grandparent   Stroke Other        FMH   Cancer Maternal Uncle        prostate   Hypertension Maternal Grandfather    Breast cancer Cousin    Breast cancer Cousin      Review of Systems  Constitutional:  Negative for malaise/fatigue.  HENT:  Negative for ear pain and tinnitus.   Eyes:  Negative for blurred vision and double vision.  Respiratory:  Negative for cough and shortness of breath.   Cardiovascular:   Negative for chest pain, palpitations and leg swelling.  Gastrointestinal:  Negative for abdominal pain, nausea and vomiting.  Musculoskeletal:  Positive for falls (history of). Negative for myalgias.  Neurological:  Negative for dizziness, weakness and headaches.  Psychiatric/Behavioral:  Negative for depression and suicidal ideas. The patient has insomnia. The patient is not nervous/anxious.         Objective    BP 117/64   Pulse (!) 58   Ht 5\' 4"  (1.626 m)   Wt 138 lb 3.2  oz (62.7 kg)   SpO2 100%   BMI 23.72 kg/m   Physical Exam Constitutional:      Appearance: Normal appearance.  Cardiovascular:     Rate and Rhythm: Normal rate and regular rhythm.     Pulses: Normal pulses.     Heart sounds: Normal heart sounds.  Pulmonary:     Effort: Pulmonary effort is normal.     Breath sounds: Normal breath sounds.  Neurological:     Mental Status: She is alert.  Psychiatric:        Mood and Affect: Mood normal.        Behavior: Behavior normal.        Thought Content: Thought content normal.        Judgment: Judgment normal.       Assessment & Plan:   1. Encounter to establish care Patient is a pleasant 82 year-old female who presents today to establish care with new primary care provider. Reviewed the past medical history, family history, social history, surgical history, and allergies today- updates made as indicated.  Patient has concerns today about skin around 3rd toe on right foot.    2. Controlled type 2 diabetes mellitus without complication, without long-term current use of insulin (HCC) Patient reports being diagnosed with type II diabetes mellitus and is currently followed by endocrinology. Will obtain records and review. Patient confirms she is currently taking linagliptin 5mg  daily as prescribed. No refills needed.   3. CKD (chronic kidney disease) stage 4, GFR 15-29 ml/min (HCC) Patient reports she has a history of CKD and is currently followed by nephrology.  Will review notes and recent labs once records are received.   4. Depression, recurrent (HCC) Patient has a long history with depression and is currently followed by psychiatry. She is taking duloxetine 90mg  daily as prescribed. No refills needed.   5. Mixed hyperlipidemia Patient has history of hyperlipidemia and is currently taking rosuvastatin 20mg  daily and fenofibrate 48mg  daily as prescribed. Will review lipid panel once records are received.   6. Right bundle branch block with left anterior fascicular block Followed by cardiology. Denies chest pain, palpitations, changes in vision, shortness of breath, lower extremity edema, headaches, lightheadedness, weakness, cough.    7. Acquired hypothyroidism Patient is currently taking levothyroxine as prescribed. She is currently followed by endocrinology. Will review thyroid function once records are received.   8. Long term (current) use of anticoagulants Patient has a history of recurrent pulmonary emboli and is currently taking warfarin 5mg  as prescribed- followed by anticoagulation clinic.   9. Recurrent pulmonary emboli (HCC) Followed by pulmonology. See #8.   10. Malignant neoplasm of left female breast, unspecified estrogen receptor status, unspecified site of breast (HCC) Followed by oncology.   11. Essential hypertension Followed by cardiology. Blood pressure is well controlled. Currently taking amlodipine 10mg  daily, furosemide 20mg  daily PRN, hydralazine 100mg  BID, labetalol 200mg  BID, and valsartan 320mg  daily as prescribed.     Return in about 3 months (around 12/29/2022) for chronic conditions.   Spent a total of 60 minutes on this patient encounter, including the following activities for this patient on the day of the visit: 15 minutes reviewing medical history from the patient's primary care provider and other specialists; 20 minutes in face-to-face time with the patient completing the history and examination and  discussing the treatment plan; 15 minutes reviewing the treatment plan with the multidisciplinary team, coordinating care for any referrals and follow ups, and reviewing multiple medications and lab  values; 10 minutes documenting clinical information in the health record and communicating with other health care professionals.    Alyson Reedy, FNP

## 2022-10-06 ENCOUNTER — Ambulatory Visit
Admission: EM | Admit: 2022-10-06 | Discharge: 2022-10-06 | Disposition: A | Discharge status: Home / Self Care | Payer: Medicare PPO | Attending: Nurse Practitioner | Admitting: Nurse Practitioner

## 2022-10-06 ENCOUNTER — Telehealth: Payer: Self-pay

## 2022-10-06 DIAGNOSIS — M542 Cervicalgia: Secondary | ICD-10-CM | POA: Diagnosis not present

## 2022-10-06 MED ORDER — TIZANIDINE HCL 2 MG PO CAPS: 2.0000 mg | ORAL_CAPSULE | Freq: Every evening | ORAL | 0 refills | Status: DC | PRN

## 2022-10-06 NOTE — Telephone Encounter (Signed)
Pharmacist called to inform that [place] that patients insurance does not cover Tizanidine. And requested baclofen   Per provider pt is unable to tolerate baclofen due to kidney issues.

## 2022-10-06 NOTE — ED Provider Notes (Signed)
[facility]    CSN: [id number] Arrival date & time: 10/06/22  1511      History   Chief Complaint Chief Complaint  Patient presents with   Neck Pain    HPI Andrea Simmons is a 82 y.o. female.   The history is provided by the patient.   The patient presents for a 2-day history of left-sided neck pain. She also complains of neck tightness and stiffness. Patient states pain worsens when she lifts her head up. Patient denies injury, trauma, headache, dizziness, worsening numbness/tingling of UE's. She reports that she has a history of a neck fracture and cervical fusion. She has been taking Tylenol and an old pain prescription with minimal relief.  Past Medical History:  Diagnosis Date   Anemia    Anxiety    Breast cancer (HCC)    Depression    Diabetes mellitus type II    Fibromyalgia    GERD (gastroesophageal reflux disease)    Hyperlipidemia    Hypertension    Malignant neoplasm of breast (female), unspecified site 1993   L breast s/p mastectomy and tamoxifen x 64yrs   Other pulmonary embolism and infarction 2008 and 2009   chronic anticoag - [facility]   Unspecified hypothyroidism     Patient Active Problem List   Diagnosis Date Noted   Depression, recurrent (HCC) 09/28/2022   CKD (chronic kidney disease) stage 4, GFR 15-29 ml/min (HCC) 08/09/2021   Tear film insufficiency 04/19/2021   Sjogren's disease (HCC) 01/24/2021   Erroneous encounter - disregard 01/21/2021   Abnormal gait 12/10/2020   Hereditary and idiopathic neuropathy, unspecified 12/10/2020   Major depressive disorder, single episode, unspecified 12/10/2020   Osteoarthritis 12/10/2020   Esophageal dysphagia 09/16/2020   Dehydration 09/16/2020   AKI (acute kidney injury) (HCC) 09/16/2020   Elevated lipase 09/16/2020   Hyperglycemia due to diabetes mellitus (HCC) 09/16/2020   Vitamin D deficiency 10/01/2019   Polypharmacy 10/01/2019   Major depressive disorder, recurrent episode, moderate  (HCC) 10/01/2019   Leg edema 06/04/2019   Chest pain of uncertain etiology 12/25/2018   Hx of pulmonary embolus 03/30/2017   Physical exam 11/30/2016   Hyperlipidemia 12/26/2013   Encounter for therapeutic drug monitoring 04/16/2013   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   Long term (current) use of anticoagulants 06/30/2010   Exertional dyspnea 11/09/2008   Diabetes type 2, controlled (HCC) 08/27/2008   ANEMIA-NOS 08/27/2008   Seasonal and perennial allergic rhinitis 08/27/2008   PULMONARY NODULE 08/27/2008   Fibromyalgia 08/27/2008   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 (HCC) 03/20/2007   Hypothyroidism 03/20/2007   Essential hypertension 03/20/2007   Recurrent pulmonary emboli (HCC) 03/20/2007   GERD 03/20/2007   ESOPHAGEAL STRICTURE 03/08/2007    Past Surgical History:  Procedure Laterality Date   APPENDECTOMY     BALLOON DILATION N/A 10/18/2020   Procedure: BALLOON DILATION;  Surgeon: Lanelle Bal, DO;  [facility];  Service: Endoscopy;  Laterality: N/A;   BIOPSY  10/18/2020   Procedure: BIOPSY;  Surgeon: Lanelle Bal, DO;  [facility];  Service: Endoscopy;;  gastric    BREAST BIOPSY Left 1993   BREAST BIOPSY Right 09/25/2014   stero. Benign   BREAST RECONSTRUCTION Left    CATARACT EXTRACTION     ESOPHAGOGASTRODUODENOSCOPY (EGD) WITH PROPOFOL N/A 10/18/2020   Procedure: ESOPHAGOGASTRODUODENOSCOPY (EGD) WITH PROPOFOL;  Surgeon: Lanelle Bal, DO;  [facility];  Service: Endoscopy;  Laterality: N/A;  11:00am   MASTECTOMY Left    ROTATOR CUFF REPAIR     SPINAL FUSION      x 2   TOTAL ABDOMINAL HYSTERECTOMY W/ BILATERAL SALPINGOOPHORECTOMY     TUBAL LIGATION      OB History   No obstetric history on file.      Home Medications    Prior to Admission medications   Medication Sig Start Date End Date Taking? Authorizing Provider  ACCU-CHEK GUIDE test strip CHECK BLOOD SUGARS DAILY  12/19/21  Yes Sheliah Hatch, MD  Accu-Chek Softclix Lancets lancets USE AS INSTRUCTED TO TEST SUGARS TWICE A DAY 09/12/21  Yes Sheliah Hatch, MD  amLODipine (NORVASC) 10 MG tablet TAKE 1 TABLET BY MOUTH EVERY DAY 07/14/22  Yes [name], [name], MD  Biotin 1000 MCG tablet Take 1,000 mcg by mouth daily.   Yes [provider]  Blood Glucose Monitoring Suppl (ACCU-CHEK GUIDE) w/Device KIT 1 each by Does not apply route 2 (two) times daily. To test sugars. Dx. E11.9 06/23/19  Yes Sheliah Hatch, MD  DULoxetine (CYMBALTA) 30 MG capsule TAKE 3 CAPSULES BY MOUTH EVERY DAY 09/23/22  Yes [name], Steva Ready., MD  eszopiclone (LUNESTA) 2 MG TABS tablet TAKE 1 TABLET (2 MG TOTAL) BY MOUTH IMMEDIATELY BEFORE BEDTIME AS NEEDED FOR SLEEP 04/03/22  Yes [name], Steva Ready., MD  fenofibrate (TRICOR) 48 MG tablet Take 48 mg by mouth daily. 01/06/21  Yes [provider]  ferrous sulfate 325 (65 FE) MG tablet TAKE 1 TABLET BY MOUTH EVERY DAY 04/26/22  Yes Sheliah Hatch, MD  FLAREX 0.1 % ophthalmic suspension Apply to eye. 02/09/21  Yes [provider]  furosemide (LASIX) 20 MG tablet TAKE 1 TABLET BY MOUTH EVERY DAY AS NEEDED 04/17/22  Yes [name], [name], MD  hydrALAZINE (APRESOLINE) 100 MG tablet TAKE 1 TABLET BY MOUTH TWICE A DAY 07/20/22  Yes [name], [name], MD  labetalol (NORMODYNE) 200 MG tablet TAKE 1 TABLET BY MOUTH TWICE A DAY 06/21/22  Yes [name], [name], MD  levothyroxine (SYNTHROID) 25 MCG tablet TAKE 1 TABLET BY MOUTH EVERY DAY BEFORE BREAKFAST 08/31/22  Yes Shade Flood, MD  linagliptin (TRADJENTA) 5 MG TABS tablet Take 5 mg by mouth daily.   Yes [provider]  Magnesium 500 MG TABS Take 500 mg by mouth daily.   Yes [provider]  rosuvastatin (CRESTOR) 20 MG tablet Take 20 mg by mouth daily.   Yes [provider]  tizanidine (ZANAFLEX) 2 MG capsule Take 1 capsule (2 mg total) by mouth at bedtime as needed for muscle  spasms. 10/06/22  Yes Mickie Kozikowski-Warren, Sadie Haber, NP  valsartan (DIOVAN) 320 MG tablet TAKE 1 TABLET BY MOUTH EVERY DAY 08/31/22  Yes [name], [name], MD  Vitamin D, Ergocalciferol, (DRISDOL) 1.25 MG (50000 UNIT) CAPS capsule TAKE 1 CAPSULE BY MOUTH ONE TIME PER WEEK 05/04/22  Yes [name], Steva Ready., MD  warfarin (COUMADIN) 5 MG tablet TAKE 1/2 TABLET (2.5 MG) BY MOUTH DAILY EXCEPT TAKE 1 TABLET (5 MG) ON MONDAYS, WEDNESDAYS AND SATURDAYS OR AS DIRECTED BY ANTICOAGULATION CLINIC 05/31/22  Yes Sheliah Hatch, MD  Colchicine (MITIGARE) 0.6 MG CAPS Take 1 capsule by mouth 2 (two) times daily as needed. 09/16/20   [name], Casimiro Needle, MD  enoxaparin (LOVENOX) 60 MG/0.6ML injection Inject 0.6 mLs (60 mg total) into the skin daily. 10/11/20   Johney Maine, MD  lidocaine (HM LIDOCAINE PATCH) 4 % Place 1  patch onto the skin daily. 01/24/22   Madelyn Brunner, DO  loteprednol (LOTEMAX) 0.5 % ophthalmic suspension SMARTSIG:In Eye(s) 04/21/22   [provider]  nitroGLYCERIN (NITROSTAT) 0.4 MG SL tablet Place 1 tablet (0.4 mg total) under the tongue every 5 (five) minutes as needed for chest pain. 10/31/18 09/28/22  [name], Anabel Bene, MD  pantoprazole (PROTONIX) 40 MG tablet Take 1 tablet (40 mg total) by mouth 2 (two) times daily before a meal. 09/18/20 09/28/22  Maurilio Lovely D, DO    Family History Family History  Problem Relation Age of Onset   Depression Other        Parent   Arthritis Other        Parent, Grandparent   Hypertension Other        Grandparent   Hyperlipidemia Other        FMH   Miscarriages / Stillbirths Other        Grandparent   Stroke Other        FMH   Cancer Maternal Uncle        prostate   Hypertension Maternal Grandfather    Breast cancer Cousin    Breast cancer Cousin     Social History Social History   Tobacco Use   Smoking status: Never   Smokeless tobacco: Never  Vaping Use   Vaping status: Never Used  Substance Use Topics   Alcohol use: No     Alcohol/week: 0.0 standard drinks of alcohol   Drug use: No     Allergies   Lipitor [atorvastatin], Morphine, Meperidine, and Naproxen sodium   Review of Systems Review of Systems Per HPI  Physical Exam Triage Vital Signs ED Triage Vitals  Encounter Vitals Group     BP 10/06/22 1546 106/64     Systolic BP Percentile --      Diastolic BP Percentile --      Pulse Rate 10/06/22 1546 66     Resp 10/06/22 1546 17     Temp 10/06/22 1546 98.5 F (36.9 C)     Temp Source 10/06/22 1546 Oral     SpO2 10/06/22 1546 95 %     Weight --      Height --      Head Circumference --      Peak Flow --      Pain Score 10/06/22 1547 9     Pain Loc --      Pain Education --      Exclude from Growth Chart --    No data found.  Updated Vital Signs BP 106/64 (BP Location: Right Arm)   Pulse 66   Temp 98.5 F (36.9 C) (Oral)   Resp 17   SpO2 95%   Visual Acuity Right Eye Distance:   Left Eye Distance:   Bilateral Distance:    Right Eye Near:   Left Eye Near:    Bilateral Near:     Physical Exam Vitals and nursing note reviewed.  Constitutional:      General: She is not in acute distress.    Appearance: Normal appearance.  HENT:     Head: Normocephalic.  Eyes:     Extraocular Movements: Extraocular movements intact.     Conjunctiva/sclera: Conjunctivae normal.     Pupils: Pupils are equal, round, and reactive to light.  Neck:   Cardiovascular:     Rate and Rhythm: Normal rate and regular rhythm.     Pulses: Normal pulses.     Heart sounds:  Normal heart sounds.  Pulmonary:     Effort: Pulmonary effort is normal.     Breath sounds: Normal breath sounds.  Abdominal:     General: Bowel sounds are normal.     Palpations: Abdomen is soft.  Musculoskeletal:     Cervical back: No erythema or signs of trauma. Pain with movement and muscular tenderness present. Decreased range of motion.  Skin:    General: Skin is warm and dry.  Neurological:     General: No focal deficit  present.     Mental Status: She is alert and oriented to person, place, and time.  Psychiatric:        Mood and Affect: Mood normal.        Behavior: Behavior normal.      UC Treatments / Results  Labs (all labs ordered are listed, but only abnormal results are displayed) Labs Reviewed - No data to display  EKG   Radiology No results found.  Procedures Procedures (including critical care time)  Medications Ordered in UC Medications - No data to display  Initial Impression / Assessment and Plan / UC Course  I have reviewed the triage vital signs and the nursing notes.  Pertinent labs & imaging results that were available during my care of the patient were reviewed by me and considered in my medical decision making (see chart for details).  The patient is well-appearing, she is in NAD, vital signs are stable.  Will treat neck pain with Tizanidine 2mg  tablets. Spasm to left neck present on exam. History of cervical fracture and fusion. Supportive care recommendations were provided and discussed with the patient to include continuing Arthritis Strength Tylenol, applying heat and gentle range of motion exercises of the neck. Patient advised to go the ER immediately for worsening numbness and tingling of UE's difficulty swallowing or severe neck pain. Patient advised to follow up with orthopedics for continued symptoms. The patient is in agreement with this plan of care and verbalizes understanding, all questions were answered. The patient is stable for discharge.    Final Clinical Impressions(s) / UC Diagnoses   Final diagnoses:  Neck pain     Discharge Instructions      Take medication as prescribed. The medication may cause drowsiness. NO operating heavy equipment, driving or drinking alcohol when taking. Continue Tylenol Arthritis Strength tablets as needed for pain. Gentle range of motion exercises with the neck as tolerated. May apply ice or heat as needed. Apply ice  for pain or swelling, heat for spasm or stiffness. Apply for 20 minutes, remove for 1 hr, repeat as needed. Go to the ER immediately if you develop orsening numbness and tingling of the upper extremities,  difficulty swallowing or severe neck pain. Follow-up with orthopedics if symptoms fail to improve. Follow-up as needed.      ED Prescriptions     Medication Sig Dispense Auth. Provider   tizanidine (ZANAFLEX) 2 MG capsule Take 1 capsule (2 mg total) by mouth at bedtime as needed for muscle spasms. 15 capsule Tniyah Nakagawa-Warren, Sadie Haber, NP      PDMP not reviewed this encounter.   Abran Cantor, NP 10/06/22 1646

## 2022-10-06 NOTE — Discharge Instructions (Signed)
Take medication as prescribed. The medication may cause drowsiness. NO operating heavy equipment, driving or drinking alcohol when taking. Continue Tylenol Arthritis Strength tablets as needed for pain. Gentle range of motion exercises with the neck as tolerated. May apply ice or heat as needed. Apply ice for pain or swelling, heat for spasm or stiffness. Apply for 20 minutes, remove for 1 hr, repeat as needed. Go to the ER immediately if you develop orsening numbness and tingling of the upper extremities,  difficulty swallowing or severe neck pain. Follow-up with orthopedics if symptoms fail to improve. Follow-up as needed.

## 2022-10-06 NOTE — ED Triage Notes (Signed)
Left lower neck pain x 2 days, hx of neck fractures and fusion.

## 2022-10-08 ENCOUNTER — Emergency Department (HOSPITAL_BASED_OUTPATIENT_CLINIC_OR_DEPARTMENT_OTHER): Payer: Medicare PPO

## 2022-10-08 ENCOUNTER — Encounter (HOSPITAL_BASED_OUTPATIENT_CLINIC_OR_DEPARTMENT_OTHER): Payer: Self-pay

## 2022-10-08 ENCOUNTER — Emergency Department (HOSPITAL_BASED_OUTPATIENT_CLINIC_OR_DEPARTMENT_OTHER)
Admission: EM | Admit: 2022-10-08 | Discharge: 2022-10-08 | Disposition: A | Discharge status: Home / Self Care | Payer: Medicare PPO | Attending: Emergency Medicine | Admitting: Emergency Medicine

## 2022-10-08 ENCOUNTER — Emergency Department (HOSPITAL_BASED_OUTPATIENT_CLINIC_OR_DEPARTMENT_OTHER): Payer: Medicare PPO | Admitting: Radiology

## 2022-10-08 DIAGNOSIS — S2232XA Fracture of one rib, left side, initial encounter for closed fracture: Secondary | ICD-10-CM | POA: Diagnosis not present

## 2022-10-08 DIAGNOSIS — Y92003 Bedroom of unspecified non-institutional (private) residence as the place of occurrence of the external cause: Secondary | ICD-10-CM | POA: Diagnosis not present

## 2022-10-08 DIAGNOSIS — R0781 Pleurodynia: Secondary | ICD-10-CM | POA: Diagnosis not present

## 2022-10-08 DIAGNOSIS — Z7901 Long term (current) use of anticoagulants: Secondary | ICD-10-CM | POA: Diagnosis not present

## 2022-10-08 DIAGNOSIS — S301XXA Contusion of abdominal wall, initial encounter: Secondary | ICD-10-CM | POA: Diagnosis not present

## 2022-10-08 DIAGNOSIS — W06XXXA Fall from bed, initial encounter: Secondary | ICD-10-CM | POA: Insufficient documentation

## 2022-10-08 DIAGNOSIS — R109 Unspecified abdominal pain: Secondary | ICD-10-CM | POA: Diagnosis not present

## 2022-10-08 LAB — BASIC METABOLIC PANEL
Anion gap: 10 (ref 5–15)
BUN: 40 mg/dL — ABNORMAL HIGH (ref 8–23)
CO2: 22 mmol/L (ref 22–32)
Calcium: 9.6 mg/dL (ref 8.9–10.3)
Chloride: 105 mmol/L (ref 98–111)
Creatinine, Ser: 1.73 mg/dL — ABNORMAL HIGH (ref 0.44–1.00)
GFR, Estimated: 30 mL/min — ABNORMAL LOW (ref >60)
Glucose, Bld: 109 mg/dL — ABNORMAL HIGH (ref 70–99)
Potassium: 4.4 mmol/L (ref 3.5–5.1)
Sodium: 137 mmol/L (ref 135–145)

## 2022-10-08 LAB — CBC WITH DIFFERENTIAL/PLATELET
Abs Immature Granulocytes: 0.01 10*3/uL (ref 0.00–0.07)
Basophils Absolute: 0 10*3/uL (ref 0.0–0.1)
Basophils Relative: 1 %
Eosinophils Absolute: 0.1 10*3/uL (ref 0.0–0.5)
Eosinophils Relative: 1 %
HCT: 30.1 % — ABNORMAL LOW (ref 36.0–46.0)
Hemoglobin: 10.3 g/dL — ABNORMAL LOW (ref 12.0–15.0)
Immature Granulocytes: 0 %
Lymphocytes Relative: 14 %
Lymphs Abs: 1 10*3/uL (ref 0.7–4.0)
MCH: 32.2 pg (ref 26.0–34.0)
MCHC: 34.2 g/dL (ref 30.0–36.0)
MCV: 94.1 fL (ref 80.0–100.0)
Monocytes Absolute: 0.7 10*3/uL (ref 0.1–1.0)
Monocytes Relative: 10 %
Neutro Abs: 5.6 10*3/uL (ref 1.7–7.7)
Neutrophils Relative %: 74 %
Platelets: 186 10*3/uL (ref 150–400)
RBC: 3.2 MIL/uL — ABNORMAL LOW (ref 3.87–5.11)
RDW: 12 % (ref 11.5–15.5)
WBC: 7.4 10*3/uL (ref 4.0–10.5)
nRBC: 0 % (ref 0.0–0.2)

## 2022-10-08 LAB — PROTIME-INR
INR: 2.4 — ABNORMAL HIGH (ref 0.8–1.2)
Prothrombin Time: 26.7 seconds — ABNORMAL HIGH (ref 11.4–15.2)

## 2022-10-08 MED ORDER — OXYCODONE-ACETAMINOPHEN 5-325 MG PO TABS
1.0000 | ORAL_TABLET | Freq: Once | ORAL | Status: AC
  Administered 2022-10-08: 1 via ORAL
  Filled 2022-10-08: qty 1

## 2022-10-08 MED ORDER — LACTATED RINGERS IV SOLN: INTRAVENOUS | Status: DC

## 2022-10-08 NOTE — ED Provider Notes (Signed)
[facility] Provider Note   CSN: [id number] Arrival date & time: 10/08/22  1050     History  Chief Complaint  Patient presents with   Brieanna Nau is a 82 y.o. female.  82 year old female presents after mechanical fall prior to arrival.  Patient is on Coumadin.  States that she was getting out of bed and fell and did not strike her head.  Fell onto her left side.  Denies any hemoptysis.  She has not been short of breath.  Does have sharp pain is worse with movement.  Denies hematuria.  No hip or back pain.       Home Medications Prior to Admission medications   Medication Sig Start Date End Date Taking? Authorizing Provider  ACCU-CHEK GUIDE test strip CHECK BLOOD SUGARS DAILY 12/19/21   Sheliah Hatch, MD  Accu-Chek Softclix Lancets lancets USE AS INSTRUCTED TO TEST SUGARS TWICE A DAY 09/12/21   Sheliah Hatch, MD  amLODipine (NORVASC) 10 MG tablet TAKE 1 TABLET BY MOUTH EVERY DAY 07/14/22   [name], Anabel Bene, MD  Biotin 1000 MCG tablet Take 1,000 mcg by mouth daily.    [provider]  Blood Glucose Monitoring Suppl (ACCU-CHEK GUIDE) w/Device KIT 1 each by Does not apply route 2 (two) times daily. To test sugars. Dx. E11.9 06/23/19   Sheliah Hatch, MD  Colchicine (MITIGARE) 0.6 MG CAPS Take 1 capsule by mouth 2 (two) times daily as needed. 09/16/20   [name], Casimiro Needle, MD  DULoxetine (CYMBALTA) 30 MG capsule TAKE 3 CAPSULES BY MOUTH EVERY DAY 09/23/22   [name], Steva Ready., MD  enoxaparin (LOVENOX) 60 MG/0.6ML injection Inject 0.6 mLs (60 mg total) into the skin daily. 10/11/20   Johney Maine, MD  eszopiclone (LUNESTA) 2 MG TABS tablet TAKE 1 TABLET (2 MG TOTAL) BY MOUTH IMMEDIATELY BEFORE BEDTIME AS NEEDED FOR SLEEP 04/03/22   [name], Steva Ready., MD  fenofibrate (TRICOR) 48 MG tablet Take 48 mg by mouth daily. 01/06/21   [provider]  ferrous sulfate 325 (65 FE) MG tablet TAKE 1 TABLET BY  MOUTH EVERY DAY 04/26/22   Sheliah Hatch, MD  FLAREX 0.1 % ophthalmic suspension Apply to eye. 02/09/21   [provider]  furosemide (LASIX) 20 MG tablet TAKE 1 TABLET BY MOUTH EVERY DAY AS NEEDED 04/17/22   [name], [name], MD  hydrALAZINE (APRESOLINE) 100 MG tablet TAKE 1 TABLET BY MOUTH TWICE A DAY 07/20/22   [name], [name], MD  labetalol (NORMODYNE) 200 MG tablet TAKE 1 TABLET BY MOUTH TWICE A DAY 06/21/22   [name], [name], MD  levothyroxine (SYNTHROID) 25 MCG tablet TAKE 1 TABLET BY MOUTH EVERY DAY BEFORE BREAKFAST 08/31/22   Shade Flood, MD  lidocaine (HM LIDOCAINE PATCH) 4 % Place 1 patch onto the skin daily. 01/24/22   Madelyn Brunner, DO  linagliptin (TRADJENTA) 5 MG TABS tablet Take 5 mg by mouth daily.    [provider]  loteprednol (LOTEMAX) 0.5 % ophthalmic suspension SMARTSIG:In Eye(s) 04/21/22   [provider]  Magnesium 500 MG TABS Take 500 mg by mouth daily.    [provider]  nitroGLYCERIN (NITROSTAT) 0.4 MG SL tablet Place 1 tablet (0.4 mg total) under the tongue every 5 (five) minutes as needed for chest pain. 10/31/18 09/28/22  [name], Anabel Bene, MD  pantoprazole (PROTONIX) 40 MG tablet Take 1 tablet (40 mg total) by mouth 2 (  two) times daily before a meal. 09/18/20 09/28/22  Sherryll Burger, [name], DO  rosuvastatin (CRESTOR) 20 MG tablet Take 20 mg by mouth daily.    [provider]  tizanidine (ZANAFLEX) 2 MG capsule Take 1 capsule (2 mg total) by mouth at bedtime as needed for muscle spasms. 10/06/22   Leath-Warren, Sadie Haber, NP  valsartan (DIOVAN) 320 MG tablet TAKE 1 TABLET BY MOUTH EVERY DAY 08/31/22   [name], Anabel Bene, MD  Vitamin D, Ergocalciferol, (DRISDOL) 1.25 MG (50000 UNIT) CAPS capsule TAKE 1 CAPSULE BY MOUTH ONE TIME PER WEEK 05/04/22   [name], Steva Ready., MD  warfarin (COUMADIN) 5 MG tablet TAKE 1/2 TABLET (2.5 MG) BY MOUTH DAILY EXCEPT TAKE 1 TABLET (5 MG) ON MONDAYS, WEDNESDAYS AND SATURDAYS OR AS  DIRECTED BY ANTICOAGULATION CLINIC 05/31/22   Sheliah Hatch, MD      Allergies    Lipitor [atorvastatin], Morphine, Meperidine, and Naproxen sodium    Review of Systems   Review of Systems  All other systems reviewed and are negative.   Physical Exam Updated Vital Signs BP 121/67   Pulse (!) 55   Temp (!) 97.5 F (36.4 C) (Temporal)   Resp 18   Ht 1.6 m (5\' 3" )   Wt 61.2 kg   SpO2 99%   BMI 23.91 kg/m  Physical Exam Vitals and nursing note reviewed.  Constitutional:      General: She is not in acute distress.    Appearance: Normal appearance. She is well-developed. She is not toxic-appearing.  HENT:     Head: Normocephalic and atraumatic.  Eyes:     General: Lids are normal.     Conjunctiva/sclera: Conjunctivae normal.     Pupils: Pupils are equal, round, and reactive to light.  Neck:     Thyroid: No thyroid mass.     Trachea: No tracheal deviation.  Cardiovascular:     Rate and Rhythm: Normal rate and regular rhythm.     Heart sounds: Normal heart sounds. No murmur heard.    No gallop.  Pulmonary:     Effort: Pulmonary effort is normal. No respiratory distress.     Breath sounds: Normal breath sounds. No stridor. No decreased breath sounds, wheezing, rhonchi or rales.  Abdominal:     General: There is no distension.     Palpations: Abdomen is soft.     Tenderness: There is no abdominal tenderness. There is no rebound.  Musculoskeletal:        General: No tenderness. Normal range of motion.       Arms:     Cervical back: Normal range of motion and neck supple.  Skin:    General: Skin is warm and dry.     Findings: No abrasion or rash.  Neurological:     Mental Status: She is alert and oriented to person, place, and time. Mental status is at baseline.     GCS: GCS eye subscore is 4. GCS verbal subscore is 5. GCS motor subscore is 6.     Cranial Nerves: Cranial nerves are intact. No cranial nerve deficit.     Sensory: No sensory deficit.     Motor:  Motor function is intact.  Psychiatric:        Attention and Perception: Attention normal.        Speech: Speech normal.        Behavior: Behavior normal.     ED Results / Procedures / Treatments   Labs (all labs ordered  are listed, but only abnormal results are displayed) Labs Reviewed  CBC WITH DIFFERENTIAL/PLATELET - Abnormal; Notable for the following components:      Result Value   RBC 3.20 (*)    Hemoglobin 10.3 (*)    HCT 30.1 (*)    All other components within normal limits  PROTIME-INR - Abnormal; Notable for the following components:   Prothrombin Time 26.7 (*)    INR 2.4 (*)    All other components within normal limits  BASIC METABOLIC PANEL - Abnormal; Notable for the following components:   Glucose, Bld 109 (*)    BUN 40 (*)    Creatinine, Ser 1.73 (*)    GFR, Estimated 30 (*)    All other components within normal limits    EKG None  Radiology DG Ribs Unilateral W/Chest Left  Result Date: 10/08/2022 CLINICAL DATA:  Fall. Complains of left posterior rib and chest pain. EXAM: LEFT RIBS AND CHEST - 3+ VIEW COMPARISON:  09/21/2020 FINDINGS: Heart size and mediastinal contours appear normal. There is no pleural fluid, interstitial edema or airspace disease. No acute displaced rib fractures identified. Chronic appearing anterior left eleventh and tenth rib fracture deformities are identified which appear similar compared with 03/05/2021. Age indeterminate fracture involves the posterior aspect of the left eighth rib. Scattered surgical clips identified within the left chest wall. IMPRESSION: 1. No acute cardiopulmonary abnormalities. 2. Age indeterminate fracture involves the posterior aspect of the left eighth rib. 3. Chronic appearing anterior left eleventh and tenth rib fracture deformities. Electronically Signed   By: Signa Kell M.D.   On: 10/08/2022 12:24   CT Renal Stone Study  Result Date: 10/08/2022 CLINICAL DATA:  Abdominal/flank pain. EXAM: CT ABDOMEN AND  PELVIS WITHOUT CONTRAST TECHNIQUE: Multidetector CT imaging of the abdomen and pelvis was performed following the standard protocol without IV contrast. RADIATION DOSE REDUCTION: This exam was performed according to the departmental dose-optimization program which includes automated exposure control, adjustment of the mA and/or kV according to patient size and/or use of iterative reconstruction technique. COMPARISON:  CT abdomen and pelvis dated 09/16/2020 FINDINGS: Lower chest: The heart is enlarged. Hepatobiliary: No focal liver abnormality is seen. No gallstones, gallbladder wall thickening, or biliary dilatation. Pancreas: Unremarkable. No pancreatic ductal dilatation or surrounding inflammatory changes. Spleen: Normal in size. A 2.1 cm cyst/pseudocyst in the posterior aspect is partially rim calcified and appears unchanged since 09/16/2020. No imaging follow-up is recommended for this finding. Adrenals/Urinary Tract: Adrenal glands are unremarkable. Kidneys are normal, without renal calculi, focal lesion, or hydronephrosis. Bladder is unremarkable. Stomach/Bowel: Stomach is within normal limits. The appendix is absent. No evidence of bowel wall thickening, distention, or inflammatory changes. Vascular/Lymphatic: Aortic atherosclerosis. No enlarged abdominal or pelvic lymph nodes. Reproductive: Status post hysterectomy. No adnexal masses. Other: No abdominal wall hernia or abnormality. No abdominopelvic ascites. Musculoskeletal: Degenerative changes are seen in the spine. Anterolisthesis of L4 on L5 measures 5 mm and is unchanged from prior exam. IMPRESSION: 1. No acute findings in the abdomen or pelvis. Aortic Atherosclerosis (ICD10-I70.0). Electronically Signed   By: Romona Curls M.D.   On: 10/08/2022 12:21    Procedures Procedures    Medications Ordered in ED Medications  oxyCODONE-acetaminophen (PERCOCET/ROXICET) 5-325 MG per tablet 1 tablet (1 tablet Oral Given 10/08/22 1242)    ED Course/  Medical Decision Making/ A&P  Medical Decision Making Amount and/or Complexity of Data Reviewed Labs: ordered. Radiology: ordered.  Risk Prescription drug management.   Patient with ecchymosis and bruising to left flank.  Concern for possible renal injury and CT scan was performed and per my review interpretation no acute findings other than an old rib fracture.  Patient's INR is therapeutic.  Patient's creatinine noted but she has history of chronic kidney disease.  Will be discharged home at this time.        Final Clinical Impression(s) / ED Diagnoses Final diagnoses:  None    Rx / DC Orders ED Discharge Orders     None         Lorre Nick, MD 10/08/22 1322

## 2022-10-08 NOTE — ED Triage Notes (Signed)
Patient here POV from Home.  Endorses Fall today when getting OOB and lost her balance causing her to fall at 0130. Fell onto [place]. No Head Injury. Does take Coumadin. No LOC.   Notes Pain to Left Flank, Torso. Mild Abrasion to Right Elbow.   NAD noted during triage. A&Ox4. GCS 15. Ambulatory.

## 2022-10-09 ENCOUNTER — Telehealth: Payer: Self-pay

## 2022-10-09 ENCOUNTER — Encounter (HOSPITAL_BASED_OUTPATIENT_CLINIC_OR_DEPARTMENT_OTHER): Payer: Self-pay | Admitting: Family Medicine

## 2022-10-09 NOTE — Telephone Encounter (Signed)
Transition Care Management Unsuccessful Follow-up Telephone Call  Date of discharge and from where:  [facility] 7/21  Attempts:  1st Attempt  Reason for unsuccessful TCM follow-up call:  No answer/busy   [facility] Guide, [facility] [phone] [address] [place], [place], [place] [address] Phone: [phone] Email: [email or web address]

## 2022-10-10 ENCOUNTER — Other Ambulatory Visit: Payer: Self-pay | Admitting: Psychiatry

## 2022-10-10 ENCOUNTER — Telehealth: Payer: Self-pay

## 2022-10-10 DIAGNOSIS — F4001 Agoraphobia with panic disorder: Secondary | ICD-10-CM

## 2022-10-10 DIAGNOSIS — F331 Major depressive disorder, recurrent, moderate: Secondary | ICD-10-CM

## 2022-10-10 DIAGNOSIS — G4721 Circadian rhythm sleep disorder, delayed sleep phase type: Secondary | ICD-10-CM

## 2022-10-10 DIAGNOSIS — F411 Generalized anxiety disorder: Secondary | ICD-10-CM

## 2022-10-10 DIAGNOSIS — F5105 Insomnia due to other mental disorder: Secondary | ICD-10-CM

## 2022-10-10 DIAGNOSIS — F338 Other recurrent depressive disorders: Secondary | ICD-10-CM

## 2022-10-10 NOTE — Telephone Encounter (Signed)
Transition Care Management Unsuccessful Follow-up Telephone Call  Date of discharge and from where:  [facility] 7/21  Attempts:  2nd Attempt  Reason for unsuccessful TCM follow-up call:  No answer/busy   Lenard Forth [facility] Guide, [facility] [phone] [address] [place], [place], [place] [address] Phone: [phone] Email: [email or web address]

## 2022-10-13 ENCOUNTER — Encounter (HOSPITAL_BASED_OUTPATIENT_CLINIC_OR_DEPARTMENT_OTHER): Payer: Self-pay | Admitting: Family Medicine

## 2022-10-25 ENCOUNTER — Ambulatory Visit: Payer: Medicare PPO

## 2022-10-25 ENCOUNTER — Telehealth: Payer: Self-pay

## 2022-10-25 NOTE — Telephone Encounter (Signed)
Pt Andrea Simmons reporting she is not feeling well today and will not be able to make her coumadin clinic apt. She reports she will f/u with coumadin clinic on Monday, 8/12 to [facility].   Reviewing pt's chart noticed pt has been in ER on 7/19 for neck pain and 7/21 for fall, abdominal contusion.

## 2022-10-30 ENCOUNTER — Other Ambulatory Visit: Payer: Self-pay | Admitting: Family Medicine

## 2022-10-30 NOTE — Telephone Encounter (Signed)
Pt called to [facility] coumadin clinic apt. Scheduled for 8/14. Pt verbalized understanding.

## 2022-11-01 ENCOUNTER — Ambulatory Visit (INDEPENDENT_AMBULATORY_CARE_PROVIDER_SITE_OTHER): Payer: Medicare PPO

## 2022-11-01 ENCOUNTER — Telehealth: Payer: Self-pay

## 2022-11-01 ENCOUNTER — Encounter (HOSPITAL_BASED_OUTPATIENT_CLINIC_OR_DEPARTMENT_OTHER): Payer: Self-pay | Admitting: Family Medicine

## 2022-11-01 DIAGNOSIS — E559 Vitamin D deficiency, unspecified: Secondary | ICD-10-CM

## 2022-11-01 DIAGNOSIS — Z7901 Long term (current) use of anticoagulants: Secondary | ICD-10-CM | POA: Diagnosis not present

## 2022-11-01 DIAGNOSIS — D649 Anemia, unspecified: Secondary | ICD-10-CM

## 2022-11-01 LAB — POCT INR: INR: 3.2 — AB (ref 2.0–3.0)

## 2022-11-01 NOTE — Patient Instructions (Addendum)
Pre visit review using our clinic review tool, if applicable. No additional management support is needed unless otherwise documented below in the visit note.  Hold dose today and then continue 1 tablet daily except take 1/2 tablet on Mondays, Wednesdays and Fridays. Recheck in 4 weeks, 9/12,  at your appointment with your new PCP, Alyson Reedy, NP, or at your new cardiologist appointment on the same day.

## 2022-11-01 NOTE — Telephone Encounter (Signed)
Pt was in the [facility] anticoagulation clinic for warfarin management this afternoon. Her chart reports she has changed primary care providers and is now seeing Alyson Reedy, FNP at [facility] at [facility], with [facility].  Advised pt that [facility] anticoagulation clinic can only manage warfarin for pts seeing [facility] providers. Her prior PCP was Dr. Beverely Low with [facility].  She reported she just thought it was time for a change in providers and is going to change all of her providers to either [facility] or [facility] [place]. She has already had her first apt with her new PCP. Her next apt with new PCP is 9/12. She will also see a new cardiologist at [facility] .  Checked INR today and pt was slightly supratherautic at 3.2 (range is 2.0-3.0).  Advised to hold warfarin today and then continue her regular dosing of 5 mg daily except take 2.5 mg on Monday, Wednesday and Friday. Advised she should have INR rechecked at her apt in 4 weeks with her new PCP, on 9/12. Advised if any changes or concerns to feel free to contact the [place] anticoagulation clinic. She has the direct number for the clinic. Pt verbalized understanding and voiced appreciation for the care she received with the anticoagulation clinic.   Forwarding msg to new PCP, Alyson Reedy, FNP, concerning warfarin management and next suggested INR check.

## 2022-11-01 NOTE — Progress Notes (Signed)
Pt has been in ER on 7/19 for neck pain and 7/21 for fall, abdominal contusion.  Pt has changed PCP to a [facility] provider at [facility]. Explained to pt the [facility] anticoagulation clinic is only allowed to manage warfarin for [facility] pts.  Hold dose today and then continue 1 tablet daily except take 1/2 tablet on Mondays, Wednesdays and Fridays. Recheck in 4 weeks, 9/12,  at your apt with your new PCP, Alyson Reedy, NP, or at your new cardiologist apt on the same day.  Sent TE to new PCP concerning warfarin management going forward and when pt needs next INR check.

## 2022-11-02 ENCOUNTER — Other Ambulatory Visit (HOSPITAL_BASED_OUTPATIENT_CLINIC_OR_DEPARTMENT_OTHER): Payer: Self-pay | Admitting: Family Medicine

## 2022-11-02 ENCOUNTER — Institutional Professional Consult (permissible substitution): Payer: Medicare PPO | Admitting: Internal Medicine

## 2022-11-02 ENCOUNTER — Ambulatory Visit: Payer: Self-pay

## 2022-11-02 NOTE — Progress Notes (Signed)
Pt has changed PCP to a [facility] provider at [facility]. Explained to pt the [facility] anticoagulation clinic is only allowed to manage warfarin for [facility] pts.  Msg was sent to new PCP, Alyson Reedy, NP, when INR recheck should be performed. Received msg back that PCP will manage warfarin until pt sees new cardiologist.  Resolving anticoagulation episodes.

## 2022-11-02 NOTE — Telephone Encounter (Signed)
Noted  

## 2022-11-07 ENCOUNTER — Other Ambulatory Visit (HOSPITAL_BASED_OUTPATIENT_CLINIC_OR_DEPARTMENT_OTHER): Payer: Self-pay

## 2022-11-07 ENCOUNTER — Other Ambulatory Visit (HOSPITAL_BASED_OUTPATIENT_CLINIC_OR_DEPARTMENT_OTHER): Payer: Self-pay | Admitting: Family Medicine

## 2022-11-07 DIAGNOSIS — D649 Anemia, unspecified: Secondary | ICD-10-CM | POA: Diagnosis not present

## 2022-11-07 DIAGNOSIS — E559 Vitamin D deficiency, unspecified: Secondary | ICD-10-CM | POA: Diagnosis not present

## 2022-11-07 LAB — IRON,TIBC AND FERRITIN PANEL
Ferritin: 165
Iron: 96
TIBC: 364
UIBC: 268

## 2022-11-07 LAB — VITAMIN D 25 HYDROXY (VIT D DEFICIENCY, FRACTURES): Vit D, 25-Hydroxy: 82.2

## 2022-11-07 LAB — VITAMIN B12: Vitamin B-12: 1759

## 2022-11-08 ENCOUNTER — Encounter (HOSPITAL_BASED_OUTPATIENT_CLINIC_OR_DEPARTMENT_OTHER): Payer: Self-pay | Admitting: Family Medicine

## 2022-11-08 LAB — B12 AND FOLATE PANEL
Folate: >20 ng/mL (ref >3.0)
Vitamin B-12: 1759 pg/mL — ABNORMAL HIGH (ref 232–1245)

## 2022-11-08 LAB — VITAMIN D 25 HYDROXY (VIT D DEFICIENCY, FRACTURES): Vit D, 25-Hydroxy: 82.2 ng/mL (ref 30.0–100.0)

## 2022-11-08 LAB — IRON,TIBC AND FERRITIN PANEL
Ferritin: 165 ng/mL — ABNORMAL HIGH (ref 15–150)
Iron Saturation: 26 % (ref 15–55)
Iron: 96 ug/dL (ref 27–139)
Total Iron Binding Capacity: 364 ug/dL (ref 250–450)
UIBC: 268 ug/dL (ref 118–369)

## 2022-11-12 NOTE — Progress Notes (Unsigned)
Subjective:    Patient ID: Andrea [facility], female    DOB: Oct 04, 1940, 82 y.o.   MRN: [id number]  HPI 12/08/10- 82 year old female never smoker followed for history of pulmonary embolism x2/Coumadin, lung nodules, allergic rhinitis, complicated by history (L)breast cancer 1993   01/23/13- 82 year old female never smoker followed for history of Pulmonary Embolism x2/Coumadin, lung nodules, allergic rhinitis, complicated by history (L)breast cancer 1993, DM, PCP Dr Felicity Coyer FOLLOWS FOR: Pt states her breathing has been pretty good; at times will get short winded. Still being followed by Coumadin Clinic. Hx PE 2008, 2009( after coumadin stopped for endoscopy)> chronic coumadin. She is satisfied to continue Coumadin with no complications. Little rhinitis this fall. Does get dry eyes that burn and water.  11/13/22- 82 yoF never smoker, former patient, now for sleep evaluation seeking sleep study due to loud snoring, HTN, daughter with OSA.(Husband is our patient Tayanna Brumit, now confined to nursing home after fall- thoracic cord compression). Medical problem list includes- RBBB/LAFB, Hx recurrent PE/ Coumadin, HTN, Allergic Rhinitis, Pulmonary Nodule, GERD/ stricture, Hypothyropid, DM2, CKD4, Sjogrens, hx Breast Ca, Depression,  Epworth score 8 Body weight today-139 lbs -Lunesta 2,  -----Never had sleep study done. Patient has been told she snores and doesn't sleep very well Frequent waking after sleep onset. Always tired in daytime but rarely naps.  Lunesta not helpful. No caffeine. No ENT surgery. CXR w Left Ribs 09/22/22- IMPRESSION: 1. No acute cardiopulmonary abnormalities. 2. Age indeterminate fracture involves the posterior aspect of the left eighth rib. 3. Chronic appearing anterior left eleventh and tenth rib fracture deformities.  Review of Systems- see HPI Constitutional:   No-   weight loss, night sweats, fevers, chills, fatigue, lassitude. HEENT:   No-  headaches, difficulty  swallowing, tooth/dental problems, sore throat,       No- sneezing, itching, ear ache, nasal congestion, post nasal drip,  CV:  No-   chest pain, orthopnea, PND, +swelling in lower extremities, no-anasarca, dizziness, palpitations Resp: No-   shortness of breath with exertion or at rest.              No-   productive cough,  No non-productive cough,  No-  coughing up of blood.              No-   change in color of mucus.  No- wheezing.   Skin: No-   rash or lesions. GI:  No-   heartburn, indigestion, abdominal pain, nausea, vomiting, GU: . MS:  No-   joint pain or swelling.   Neuro- grossly normal  Psych:  No- change in mood or affect. No depression or anxiety.  No memory loss.  Objective:   Physical Exam General- Alert, Oriented, Affect-appropriate, Distress- none acute.  Skin- rash-none, lesions- none, excoriation- none Lymphadenopathy- none Head- atraumatic            Eyes- Gross vision intact, PERRLA, conjunctivae clear secretions            Ears- Hearing, canals-normal            Nose- Clear, no-Septal dev, mucus, polyps, erosion, perforation             Throat- Mallampati IV , mucosa clear , drainage- none, tonsils- atrophic. +gravelly voice Neck- flexible , trachea midline, no stridor , thyroid nl, carotid no bruit Chest - symmetrical excursion , unlabored           Heart/CV- RRR , no murmur , no gallop  , no rub, nl s1  s2                           - JVD- none , edema- none, stasis changes- none, varices- none           Lung- clear to P&A, wheeze- none, cough- none , dullness-none, rub- none           Chest wall-  Abd-  Br/ Gen/ Rectal- Not done, not indicated Extrem- cyanosis- none, clubbing, none, atrophy- none, strength- nl Neuro- grossly intact to observation Assessment & Plan:

## 2022-11-13 ENCOUNTER — Ambulatory Visit: Payer: Medicare PPO | Admitting: Internal Medicine

## 2022-11-13 ENCOUNTER — Encounter: Payer: Self-pay | Admitting: Internal Medicine

## 2022-11-13 VITALS — BP 120/70 | HR 67 | Ht 62.0 in | Wt 139.4 lb

## 2022-11-13 DIAGNOSIS — R911 Solitary pulmonary nodule: Secondary | ICD-10-CM

## 2022-11-13 DIAGNOSIS — E039 Hypothyroidism, unspecified: Secondary | ICD-10-CM

## 2022-11-13 DIAGNOSIS — R0683 Snoring: Secondary | ICD-10-CM | POA: Diagnosis not present

## 2022-11-13 NOTE — Assessment & Plan Note (Signed)
Remote concern. No nodule noted on CXR 09/22/22

## 2022-11-13 NOTE — Patient Instructions (Signed)
Order- schedule home sleep test      dx snoring  Please callus for results and recommendations about 2 weeks after your sleep test

## 2022-11-13 NOTE — Assessment & Plan Note (Signed)
Strong family hx of OSA and she is concerned her snoring and tiredness indicate the same. Appropriate discussion. Plan- schedule sleep study.

## 2022-11-14 NOTE — Assessment & Plan Note (Signed)
Being managed. Doubt tiredness reflects untreated hypothyroidism.

## 2022-11-15 ENCOUNTER — Ambulatory Visit: Payer: Medicare PPO | Admitting: Psychiatry

## 2022-11-16 ENCOUNTER — Other Ambulatory Visit: Payer: Self-pay | Admitting: Psychiatry

## 2022-11-16 DIAGNOSIS — F411 Generalized anxiety disorder: Secondary | ICD-10-CM

## 2022-11-16 DIAGNOSIS — F331 Major depressive disorder, recurrent, moderate: Secondary | ICD-10-CM

## 2022-11-16 DIAGNOSIS — F4001 Agoraphobia with panic disorder: Secondary | ICD-10-CM

## 2022-11-16 DIAGNOSIS — F338 Other recurrent depressive disorders: Secondary | ICD-10-CM

## 2022-11-16 NOTE — Progress Notes (Signed)
Subjective:   Patient ID: Andrea Simmons, female   DOB: 82 y.o.   MRN: [id number]   HPI Patient presents stating she has a lot of pain on the third digit right foot stating it has been inflamed sore and hard to wear shoe gear with.  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  If symptoms persist may require arthroplasty which I educated her on today procedure was

## 2022-11-24 ENCOUNTER — Other Ambulatory Visit: Payer: Self-pay | Admitting: Psychiatry

## 2022-11-24 ENCOUNTER — Other Ambulatory Visit: Payer: Self-pay | Admitting: Cardiology

## 2022-11-24 DIAGNOSIS — F4001 Agoraphobia with panic disorder: Secondary | ICD-10-CM

## 2022-11-24 DIAGNOSIS — F331 Major depressive disorder, recurrent, moderate: Secondary | ICD-10-CM

## 2022-11-24 DIAGNOSIS — F338 Other recurrent depressive disorders: Secondary | ICD-10-CM

## 2022-11-24 DIAGNOSIS — R600 Localized edema: Secondary | ICD-10-CM

## 2022-11-24 DIAGNOSIS — F411 Generalized anxiety disorder: Secondary | ICD-10-CM

## 2022-11-25 ENCOUNTER — Other Ambulatory Visit: Payer: Self-pay | Admitting: Psychiatry

## 2022-11-25 DIAGNOSIS — F331 Major depressive disorder, recurrent, moderate: Secondary | ICD-10-CM

## 2022-11-25 DIAGNOSIS — F4001 Agoraphobia with panic disorder: Secondary | ICD-10-CM

## 2022-11-25 DIAGNOSIS — F338 Other recurrent depressive disorders: Secondary | ICD-10-CM

## 2022-11-25 DIAGNOSIS — F411 Generalized anxiety disorder: Secondary | ICD-10-CM

## 2022-11-28 ENCOUNTER — Other Ambulatory Visit (HOSPITAL_BASED_OUTPATIENT_CLINIC_OR_DEPARTMENT_OTHER): Payer: Self-pay | Admitting: Family Medicine

## 2022-11-28 ENCOUNTER — Encounter (HOSPITAL_BASED_OUTPATIENT_CLINIC_OR_DEPARTMENT_OTHER): Payer: Self-pay | Admitting: Family Medicine

## 2022-11-30 ENCOUNTER — Telehealth (HOSPITAL_BASED_OUTPATIENT_CLINIC_OR_DEPARTMENT_OTHER): Payer: Self-pay | Admitting: *Deleted

## 2022-11-30 ENCOUNTER — Ambulatory Visit (HOSPITAL_BASED_OUTPATIENT_CLINIC_OR_DEPARTMENT_OTHER): Payer: Medicare PPO | Admitting: Family Medicine

## 2022-11-30 ENCOUNTER — Ambulatory Visit (HOSPITAL_BASED_OUTPATIENT_CLINIC_OR_DEPARTMENT_OTHER): Payer: Medicare PPO | Admitting: Cardiovascular Disease

## 2022-11-30 ENCOUNTER — Encounter (HOSPITAL_BASED_OUTPATIENT_CLINIC_OR_DEPARTMENT_OTHER): Payer: Self-pay | Admitting: Family Medicine

## 2022-11-30 ENCOUNTER — Encounter (HOSPITAL_BASED_OUTPATIENT_CLINIC_OR_DEPARTMENT_OTHER): Payer: Self-pay | Admitting: Cardiovascular Disease

## 2022-11-30 VITALS — BP 120/52 | HR 52 | Ht 62.0 in | Wt 144.1 lb

## 2022-11-30 VITALS — BP 134/56 | HR 51 | Ht 62.0 in | Wt 143.4 lb

## 2022-11-30 DIAGNOSIS — I452 Bifascicular block: Secondary | ICD-10-CM | POA: Diagnosis not present

## 2022-11-30 DIAGNOSIS — E559 Vitamin D deficiency, unspecified: Secondary | ICD-10-CM

## 2022-11-30 DIAGNOSIS — E119 Type 2 diabetes mellitus without complications: Secondary | ICD-10-CM

## 2022-11-30 DIAGNOSIS — E782 Mixed hyperlipidemia: Secondary | ICD-10-CM | POA: Diagnosis not present

## 2022-11-30 DIAGNOSIS — F4001 Agoraphobia with panic disorder: Secondary | ICD-10-CM | POA: Diagnosis not present

## 2022-11-30 DIAGNOSIS — E039 Hypothyroidism, unspecified: Secondary | ICD-10-CM

## 2022-11-30 DIAGNOSIS — F411 Generalized anxiety disorder: Secondary | ICD-10-CM | POA: Diagnosis not present

## 2022-11-30 DIAGNOSIS — I2699 Other pulmonary embolism without acute cor pulmonale: Secondary | ICD-10-CM

## 2022-11-30 DIAGNOSIS — Z7984 Long term (current) use of oral hypoglycemic drugs: Secondary | ICD-10-CM

## 2022-11-30 DIAGNOSIS — F331 Major depressive disorder, recurrent, moderate: Secondary | ICD-10-CM

## 2022-11-30 DIAGNOSIS — I10 Essential (primary) hypertension: Secondary | ICD-10-CM

## 2022-11-30 DIAGNOSIS — F338 Other recurrent depressive disorders: Secondary | ICD-10-CM | POA: Diagnosis not present

## 2022-11-30 MED ORDER — DULOXETINE HCL 30 MG PO CPEP
90.0000 mg | ORAL_CAPSULE | Freq: Every day | ORAL | 0 refills | Status: DC
Start: 2022-11-30 — End: 2022-12-11

## 2022-11-30 NOTE — Assessment & Plan Note (Signed)
Patient currently taking Tradjenta 5mg  daily. She reports she is not taking her blood sugar at home. Will check hemoglobin A1c today.

## 2022-11-30 NOTE — Progress Notes (Signed)
Cardiology Office Note:  .    Date:  11/30/2022  ID:  Andrea Simmons, DOB [birth year], MRN [id number] PCP: Alyson Reedy, FNP  [facility] Providers Cardiologist:  None     History of Present Illness: .    Andrea Simmons is a 82 y.o. 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   She has been working with Dr. Rosemary Holms to manage her hypertension, last seen by him 02/15/2022. Home blood pressures averaged 160s/80s and her hydralazine was increased to a full tablet of 50 mg twice daily. Given that her blood pressure remained uncontrolled on 4 agents she had renal artery dopplers 06/2022 showing no evidence of renal artery stenosis. Seen by Dr. Melton Alar 07/2022 and her blood pressure was well controlled at that time.  Today, she reports that she would like a second opinion regarding her blood pressure management and her antihypertensive regimen. She presents records of her blood pressure, which has been ranging 130s-150s at home. In the office her blood pressure is 102/50 initially, and 134/56 on manual recheck. Her current regimen includes amlodipine, valsartan, coumadin, rosuvastatin. Currently she is taking Lasix every day, if she doesn't her swelling is significant by the end of the day. She has a history of two DVTs: her first was about 13 years ago. At times she experiences a central chest pain. This woke her up the other morning, but the episode was not as severe as it has been in the past. She was previously provided with nitroglycerin which she has used rarely from time to time. There have only been two severe episodes recently. Her chest pain does not occur with walking. EKG was performed today showing sinus bradycardia at 51 bpm, with LAD and RBBB. However, she does complain of "giving out" easily when walking.  This has been an ongoing concern for 4 years. Previously very active with yard work and house work, not as much formal exercise. Lately she has been feeling stressed and states that she stays fatigued and sometimes disinterested in activity. Regarding her diet she cooks some of her meals at home, and at other times has frozen dinners. No caffeine, no alcohol consumption. She confirms that she snores and struggles with insomnia. Last night she slept for about 3 hours. Currently working with Dr. Maple Hudson and is in the process of testing for sleep apnea. She denies any palpitations, lightheadedness, headaches, syncope, orthopnea, or PND.  ROS:  Please see the history of present illness. All other systems are reviewed and negative.  (+) Central chest pain (+) Fatigue (+) Shortness of breath (+) LE edema (+) Neck pain (+) Snoring (+) Insomnia (+) Stress  Studies Reviewed: Marland Kitchen       EKG 11/30/22: Sinus bradycardia.  Rate 51 bpm.  RBBB.  LAD.   Renal artery duplex  07/18/2022: No evidence of renal artery occlusive disease in either renal artery. Normal intrarenal vascular perfusion is noted in the right kidney and mild increase in the resistivity index left kidney suggests medico-renal disease. Renal length is within normal limits for both kidneys. Abdominal aortic atherosclerosis.   Risk Assessment/Calculations:             Physical Exam:    VS:  BP (!) 134/56 (BP Location: Right Arm, Patient Position: Sitting, Cuff Size: Normal)   Pulse (!) 51   Ht 5\' 2"  (1.575 m)   Wt 143  lb 6.4 oz (65 kg)   BMI 26.23 kg/m  , BMI Body mass index is 26.23 kg/m. GENERAL:  Well appearing HEENT: Pupils equal round and reactive, fundi not visualized, oral mucosa unremarkable NECK:  No jugular venous distention, waveform within normal limits, carotid upstroke brisk and symmetric, no bruits, no thyromegaly LUNGS:  Clear to auscultation bilaterally HEART:  RRR.  PMI not displaced or sustained,S1 and S2 within  normal limits, no S3, no S4, no clicks, no rubs, no murmurs ABD:  Flat, positive bowel sounds normal in frequency in pitch, no bruits, no rebound, no guarding, no midline pulsatile mass, no hepatomegaly, no splenomegaly EXT:  2 plus pulses throughout, no edema, no cyanosis no clubbing SKIN:  No rashes no nodules NEURO:  Cranial nerves II through XII grossly intact, motor grossly intact throughout PSYCH:  Cognitively intact, oriented to person place and time  Wt Readings from Last 3 Encounters:  11/30/22 143 lb 6.4 oz (65 kg)  11/30/22 144 lb 1.6 oz (65.4 kg)  11/13/22 139 lb 6.4 oz (63.2 kg)     ASSESSMENT AND PLAN: .    # Hypertension Patient reports fatigue and lack of interest in activities. Blood pressure readings vary significantly between home and office measurements. Patient is currently on Amlodipine, Valsartan, and Lasix. -Order 24-hour blood pressure monitor to better understand blood pressure fluctuations. -Order labs to check for hormonal imbalances that could be affecting blood pressure (aldosterone, cortisol, catecholamines, metanephrines). -Continue current medications until further information is obtained. -May be a candidate for the Kardia3 study  # Sleep Apnea Patient reports snoring and poor sleep quality. Currently in process of being evaluated for CPAP. -Encourage patient to continue with sleep study evaluation.  # Chronic Kidney Disease (Stage 3b) Patient reports taking Lasix daily to manage fluid retention. -Continue current management.  # Recurrent DVT:  Patient reports two previous episodes of blood clots, currently on Warfarin. -Continue current anticoagulation therapy.  # Chronic Pain Patient reports chronic neck pain due to previous injury and surgeries. -Consider options for pain management if needed.   # Hyperlipidemia:   General Health Maintenance -Continue with planned eye exam and INR check. -Schedule follow-up appointment in one month to  review results of blood pressure monitor and labs.            Dispo:  [facility] with [facility] in 1-2 months.  I,Mathew Stumpf,acting as a Neurosurgeon for Chilton Si, MD.,have documented all relevant documentation on the behalf of Chilton Si, MD,as directed by  Chilton Si, MD while in the presence of Chilton Si, MD.  I, Lorain Keast C. Duke Salvia, MD have reviewed all documentation for this visit.  The documentation of the exam, diagnosis, procedures, and orders on 11/30/2022 are all accurate and complete.   Signed, Chilton Si, MD

## 2022-11-30 NOTE — Progress Notes (Deleted)
  Cardiology Office Note:  .   Date:  11/30/2022  ID:  Andrea Simmons, DOB [birth year], MRN [id number] PCP: Alyson Reedy, FNP  [facility] Providers Cardiologist:  None { Click to update primary MD,subspecialty MD or APP then REFRESH:1}   History of Present Illness: .   Andrea Simmons is a 82 y.o. female ***  ROS: ***  Studies Reviewed: .        *** Risk Assessment/Calculations:   {Does this patient have ATRIAL FIBRILLATION?:[phone]} No BP recorded.  {Refresh Note OR Click here to enter BP  :1}***       Physical Exam:   VS:  There were no vitals taken for this visit.   Wt Readings from Last 3 Encounters:  11/13/22 139 lb 6.4 oz (63.2 kg)  10/08/22 135 lb (61.2 kg)  09/28/22 138 lb 3.2 oz (62.7 kg)    GEN: Well nourished, well developed in no acute distress NECK: No JVD; No carotid bruits CARDIAC: ***RRR, no murmurs, rubs, gallops RESPIRATORY:  Clear to auscultation without rales, wheezing or rhonchi  ABDOMEN: Soft, non-tender, non-distended EXTREMITIES:  No edema; No deformity   ASSESSMENT AND PLAN: .   ***    {Are you ordering a CV Procedure (e.g. stress test, cath, DCCV, TEE, etc)?   Press F2        :[id number]}  Dispo: ***  Signed, Chilton Si, MD

## 2022-11-30 NOTE — Assessment & Plan Note (Signed)
Patient is currently taking warfarn. She takes 2.5mg  daily, except on Mondays, Wednesdays, and Saturdays she takes 5mg . She was having her INR checked at her former PCP but now needs it repeated.

## 2022-11-30 NOTE — Assessment & Plan Note (Signed)
Patient currently taking levothyroxine 25 mcg daily. Will check thyroid function today.

## 2022-11-30 NOTE — Patient Instructions (Addendum)
Medication Instructions:  Your physician recommends that you continue on your current medications as directed. Please refer to the Current Medication list given to you today.  *If you need a refill on your cardiac medications before your next appointment, please call your pharmacy*  Lab Work: METANEPHRINES/CATACHOLAMINES/RENIN/ALDOSTERONE/CORTISOL SOON, NEEDS TO BE DONE FIRST THING IN THE MORNING   PT/INR CHECK SOON   Testing/Procedures: Your provider has recommended a 24 hour blood pressure monitor- We will get back to you on this about getting it set up.   Follow-Up:  1-2 months in [facility] with Dr. Duke Salvia, Gillian Shields, NP or PharmD

## 2022-11-30 NOTE — Assessment & Plan Note (Signed)
Patient presents today with concerns for polypharmacy. Patient has vitamin D insufficiency, currently taking 50,000 units. Once she completes this regimen, would be reasonable to take OTC vitamin D3 1000-2000 units.

## 2022-11-30 NOTE — Progress Notes (Signed)
Established Patient Office Visit  Subjective   Patient ID: Andrea Simmons, female    DOB: 1940/12/10  Age: 82 y.o. MRN: [id number]  Andrea Simmons is an 82 year-old female patient who presents today for management of chronic conditions.   HTN- amlodipine 10mg  daily  Lasix 20mg  daily  Hydralazine 100 twice daily  Labetalol 200mg  twice daily  Valsartan 320 daily   9/2: 136/69 9/3: 133/65 9/4: 145/63 9/6: 163/69 9/8: 141/65 9/9: 153/71 9/11: 150/64 9/12: 149/70  DM2- linagliptin 5mg  daily   HLD- crestor 20mg  daily  Fenofibrate 48 mg daily   Hypothyroidsm- levothyroxine 25 mcg daily   Chronic VTEs- warfarin taking 2.5mg , except on 5mg  M, W, Sat  Anemia associated with Stage 3b CKD- taking ferrous sulfate   Review of Systems  Constitutional:  Negative for malaise/fatigue.  Respiratory:  Negative for cough and shortness of breath.   Cardiovascular:  Negative for chest pain, palpitations and leg swelling.  Gastrointestinal:  Negative for abdominal pain, heartburn, nausea and vomiting.  Musculoskeletal:  Negative for myalgias.  Neurological:  Negative for dizziness, weakness and headaches.  Psychiatric/Behavioral:  Negative for depression and suicidal ideas. The patient is not nervous/anxious and does not have insomnia.       Objective:     BP (!) 120/52   Pulse (!) 52   Ht 5\' 2"  (1.575 m)   Wt 144 lb 1.6 oz (65.4 kg)   SpO2 98%   BMI 26.36 kg/m  BP Readings from Last 3 Encounters:  11/30/22 (!) 102/50  11/30/22 (!) 120/52  11/13/22 120/70     Physical Exam Constitutional:      Appearance: Normal appearance.  Cardiovascular:     Rate and Rhythm: Normal rate and regular rhythm.     Pulses: Normal pulses.     Heart sounds: Normal heart sounds.  Pulmonary:     Effort: Pulmonary effort is normal.     Breath sounds: Normal breath sounds.  Neurological:     Mental Status: She is alert.  Psychiatric:        Mood and Affect: Mood normal.        Behavior:  Behavior normal.        Thought Content: Thought content normal.        Judgment: Judgment normal.     Assessment & Plan:  Controlled type 2 diabetes mellitus without complication, without long-term current use of insulin (HCC) Assessment & Plan: Patient currently taking Tradjenta 5mg  daily. She reports she is not taking her blood sugar at home. Will check hemoglobin A1c today.   Orders: -     Hemoglobin A1c -     Lipid panel  Acquired hypothyroidism Assessment & Plan: Patient currently taking levothyroxine 25 mcg daily. Will check thyroid function today.   Orders: -     TSH Rfx on Abnormal to Free T4  Essential hypertension Assessment & Plan: Patient presents today with slightly decreased diastolic blood pressure. Patient in no acute distress and is well-appearing. Denies chest pain, shortness of breath, lower extremity edema, vision changes, headaches. Cardiovascular exam with heart regular rate and rhythm. Normal heart sounds, no murmurs present. No lower extremity edema present. Lungs clear to auscultation bilaterally. Patient is currently taking amlodipine 10mg  daily, hydralazine 100 twice daily, labetelol 200mg  twice daily, valsartan 320 daily, and lasix 20mg  daily. About to discuss weaning off some blood pressure medications when patient mentioned she is seeing cardiology after this appointment. Will allow cardiology to make adjustment. Reviewed home blood  pressure readings.    Orders: -     Comprehensive metabolic panel  Recurrent pulmonary emboli [facility]) Assessment & Plan: Patient is currently taking warfarn. She takes 2.5mg  daily, except on Mondays, Wednesdays, and Saturdays she takes 5mg . She was having her INR checked at her former PCP but now needs it repeated.   Orders: -     Protime-INR  Vitamin D deficiency Assessment & Plan: Patient presents today with concerns for polypharmacy. Patient has vitamin D insufficiency, currently taking 50,000 units. Once she completes  this regimen, would be reasonable to take OTC vitamin D3 1000-2000 units.      Return in about 3 months (around 03/01/2023) for HTN follow-up, Diabetes f/u.    Andrea Reedy, FNP

## 2022-11-30 NOTE — Telephone Encounter (Signed)
Patient in for visit with Dr Duke Salvia  Asked if Dr Duke Salvia could refill her Cymbalta, trying to find new doctor Dr Duke Salvia ok filling x 1, rx sent to pharmacy  Left message to call back

## 2022-11-30 NOTE — Assessment & Plan Note (Signed)
Patient presents today with slightly decreased diastolic blood pressure. Patient in no acute distress and is well-appearing. Denies chest pain, shortness of breath, lower extremity edema, vision changes, headaches. Cardiovascular exam with heart regular rate and rhythm. Normal heart sounds, no murmurs present. No lower extremity edema present. Lungs clear to auscultation bilaterally. Patient is currently taking amlodipine 10mg  daily, hydralazine 100 twice daily, labetelol 200mg  twice daily, valsartan 320 daily, and lasix 20mg  daily. About to discuss weaning off some blood pressure medications when patient mentioned she is seeing cardiology after this appointment. Will allow cardiology to make adjustment. Reviewed home blood pressure readings.

## 2022-12-01 NOTE — Telephone Encounter (Signed)
Advised patient, verbalized understanding

## 2022-12-01 NOTE — Telephone Encounter (Signed)
Patient is returning call.  °

## 2022-12-04 ENCOUNTER — Ambulatory Visit: Payer: Medicare PPO | Attending: Cardiology

## 2022-12-04 ENCOUNTER — Other Ambulatory Visit: Payer: Self-pay

## 2022-12-04 DIAGNOSIS — I10 Essential (primary) hypertension: Secondary | ICD-10-CM | POA: Diagnosis not present

## 2022-12-04 DIAGNOSIS — Z5181 Encounter for therapeutic drug level monitoring: Secondary | ICD-10-CM | POA: Diagnosis not present

## 2022-12-04 DIAGNOSIS — I824Y9 Acute embolism and thrombosis of unspecified deep veins of unspecified proximal lower extremity: Secondary | ICD-10-CM

## 2022-12-04 DIAGNOSIS — I2699 Other pulmonary embolism without acute cor pulmonale: Secondary | ICD-10-CM

## 2022-12-04 DIAGNOSIS — Z86718 Personal history of other venous thrombosis and embolism: Secondary | ICD-10-CM

## 2022-12-04 HISTORY — DX: Acute embolism and thrombosis of unspecified deep veins of unspecified proximal lower extremity: I82.4Y9

## 2022-12-04 LAB — POCT INR: INR: 2.2 (ref 2.0–3.0)

## 2022-12-04 NOTE — Patient Instructions (Signed)
Continue taking 1 tablet Daily, except 0.5 tablet on Monday, Wednesday and Friday.  INR in 2 weeks.  A full discussion of the nature of anticoagulants has been carried out.  A benefit risk analysis has been presented to the patient, so that they understand the justification for choosing anticoagulation at this time. The need for frequent and regular monitoring, precise dosage adjustment and compliance is stressed.  Side effects of potential bleeding are discussed.  The patient should avoid any OTC items containing aspirin or ibuprofen, and should avoid great swings in general diet.  Avoid alcohol consumption.  Call if any signs of abnormal bleeding.  [phone]

## 2022-12-10 DIAGNOSIS — G4730 Sleep apnea, unspecified: Secondary | ICD-10-CM | POA: Diagnosis not present

## 2022-12-11 ENCOUNTER — Other Ambulatory Visit: Payer: Self-pay | Admitting: Psychiatry

## 2022-12-11 DIAGNOSIS — F331 Major depressive disorder, recurrent, moderate: Secondary | ICD-10-CM

## 2022-12-11 DIAGNOSIS — G4721 Circadian rhythm sleep disorder, delayed sleep phase type: Secondary | ICD-10-CM

## 2022-12-11 DIAGNOSIS — F5105 Insomnia due to other mental disorder: Secondary | ICD-10-CM

## 2022-12-11 DIAGNOSIS — F338 Other recurrent depressive disorders: Secondary | ICD-10-CM

## 2022-12-11 DIAGNOSIS — F4001 Agoraphobia with panic disorder: Secondary | ICD-10-CM

## 2022-12-11 DIAGNOSIS — F411 Generalized anxiety disorder: Secondary | ICD-10-CM

## 2022-12-13 ENCOUNTER — Other Ambulatory Visit: Payer: Self-pay | Admitting: Family Medicine

## 2022-12-13 LAB — CORTISOL: Cortisol: 11.9 ug/dL (ref 6.2–19.4)

## 2022-12-13 LAB — ALDOSTERONE + RENIN ACTIVITY W/ RATIO
Aldos/Renin Ratio: 1 (ref 0.0–30.0)
Aldosterone: 3.9 ng/dL (ref 0.0–30.0)
Renin Activity, Plasma: 3.876 ng/mL/hr (ref 0.167–5.380)

## 2022-12-13 LAB — CATECHOLAMINES, FRACTIONATED, PLASMA
Dopamine: 101 pg/mL — ABNORMAL HIGH (ref 0–48)
Epinephrine: <15 pg/mL (ref 0–62)
Norepinephrine: 1677 pg/mL — ABNORMAL HIGH (ref 0–874)

## 2022-12-13 LAB — METANEPHRINES, PLASMA
Metanephrine, Free: <25 pg/mL (ref 0.0–88.0)
Normetanephrine, Free: 321.2 pg/mL — ABNORMAL HIGH (ref 0.0–285.2)

## 2022-12-18 ENCOUNTER — Ambulatory Visit: Payer: Medicare PPO

## 2022-12-21 ENCOUNTER — Ambulatory Visit: Payer: Medicare PPO | Attending: Internal Medicine

## 2022-12-21 ENCOUNTER — Telehealth: Payer: Self-pay

## 2022-12-21 DIAGNOSIS — Z86718 Personal history of other venous thrombosis and embolism: Secondary | ICD-10-CM | POA: Diagnosis not present

## 2022-12-21 DIAGNOSIS — I2699 Other pulmonary embolism without acute cor pulmonale: Secondary | ICD-10-CM

## 2022-12-21 LAB — POCT INR: INR: 2.5 (ref 2.0–3.0)

## 2022-12-21 NOTE — Telephone Encounter (Signed)
Pt missed [facility] appt. Called pt, no answer. Left voicemail with call back number to reschedule.

## 2022-12-21 NOTE — Patient Instructions (Signed)
Description   Continue taking 1 tablet Daily, except 0.5 tablet on Monday, Wednesday and Friday.   Recheck INR in 4 weeks. [facility]  [phone]

## 2022-12-22 ENCOUNTER — Encounter (HOSPITAL_BASED_OUTPATIENT_CLINIC_OR_DEPARTMENT_OTHER): Payer: Self-pay

## 2022-12-22 NOTE — Telephone Encounter (Signed)
Opened in error

## 2022-12-29 ENCOUNTER — Telehealth (HOSPITAL_BASED_OUTPATIENT_CLINIC_OR_DEPARTMENT_OTHER): Payer: Self-pay | Admitting: *Deleted

## 2022-12-29 DIAGNOSIS — I1A0 Resistant hypertension: Secondary | ICD-10-CM

## 2022-12-29 DIAGNOSIS — R7989 Other specified abnormal findings of blood chemistry: Secondary | ICD-10-CM

## 2022-12-29 NOTE — Telephone Encounter (Signed)
-----   Message from [facility] sent at 12/28/2022 11:21 PM EDT ----- Levels are elevated.  We need to get a 24 hour urine catecholamines and metanephrines collected on ice to rule out pheochromocytoma.  Renin/aldo and cortisol are all normal.

## 2022-12-29 NOTE — Telephone Encounter (Signed)
Left message to call back  

## 2023-01-01 ENCOUNTER — Encounter (HOSPITAL_BASED_OUTPATIENT_CLINIC_OR_DEPARTMENT_OTHER): Payer: Self-pay | Admitting: Family Medicine

## 2023-01-01 ENCOUNTER — Encounter (HOSPITAL_BASED_OUTPATIENT_CLINIC_OR_DEPARTMENT_OTHER): Payer: Self-pay

## 2023-01-01 DIAGNOSIS — M3501 Sicca syndrome with keratoconjunctivitis: Secondary | ICD-10-CM | POA: Diagnosis not present

## 2023-01-01 DIAGNOSIS — H1045 Other chronic allergic conjunctivitis: Secondary | ICD-10-CM | POA: Diagnosis not present

## 2023-01-01 DIAGNOSIS — F339 Major depressive disorder, recurrent, unspecified: Secondary | ICD-10-CM

## 2023-01-01 DIAGNOSIS — H16143 Punctate keratitis, bilateral: Secondary | ICD-10-CM | POA: Diagnosis not present

## 2023-01-01 NOTE — Telephone Encounter (Signed)
Reached out to patient again at daughter's request in regards to 24 hour urine. Spoke with patient and she will come by today to pick up her container.

## 2023-01-03 DIAGNOSIS — R7989 Other specified abnormal findings of blood chemistry: Secondary | ICD-10-CM | POA: Diagnosis not present

## 2023-01-03 DIAGNOSIS — I1A0 Resistant hypertension: Secondary | ICD-10-CM | POA: Diagnosis not present

## 2023-01-08 ENCOUNTER — Other Ambulatory Visit (HOSPITAL_BASED_OUTPATIENT_CLINIC_OR_DEPARTMENT_OTHER): Payer: Self-pay | Admitting: Family Medicine

## 2023-01-09 DIAGNOSIS — G4733 Obstructive sleep apnea (adult) (pediatric): Secondary | ICD-10-CM | POA: Diagnosis not present

## 2023-01-11 LAB — METANEPHRINES, URINE, 24 HOUR
Metaneph Total, Ur: 17 ug/L
Metanephrines, 24H Ur: 34 ug/(24.h) — ABNORMAL LOW (ref 36–209)
Normetanephrine, 24H Ur: 484 ug/(24.h) (ref 131–612)
Normetanephrine, Ur: 242 ug/L

## 2023-01-11 LAB — CATECHOLAMINES, FRACTIONATED, URINE, 24 HOUR
Dopamine , 24H Ur: 50 ug/(24.h) (ref 0–510)
Dopamine, Rand Ur: 25 ug/L
Epinephrine, 24H Ur: <6 ug/(24.h) (ref 0–20)
Epinephrine, Rand Ur: <3 ug/L
Norepinephrine, 24H Ur: <30 ug/(24.h) (ref 0–135)
Norepinephrine, Rand Ur: <15 ug/L

## 2023-01-15 ENCOUNTER — Other Ambulatory Visit: Payer: Self-pay | Admitting: Psychiatry

## 2023-01-15 ENCOUNTER — Encounter (HOSPITAL_BASED_OUTPATIENT_CLINIC_OR_DEPARTMENT_OTHER): Payer: Medicare PPO

## 2023-01-15 DIAGNOSIS — F331 Major depressive disorder, recurrent, moderate: Secondary | ICD-10-CM

## 2023-01-15 DIAGNOSIS — F338 Other recurrent depressive disorders: Secondary | ICD-10-CM

## 2023-01-15 DIAGNOSIS — F411 Generalized anxiety disorder: Secondary | ICD-10-CM

## 2023-01-15 DIAGNOSIS — F4001 Agoraphobia with panic disorder: Secondary | ICD-10-CM

## 2023-01-15 NOTE — Telephone Encounter (Signed)
LF 10/07; LV 01/11

## 2023-01-15 NOTE — Telephone Encounter (Signed)
Urine labs completed removing from inbasket!

## 2023-01-17 ENCOUNTER — Encounter: Payer: Self-pay | Admitting: Psychiatry

## 2023-01-17 ENCOUNTER — Ambulatory Visit (INDEPENDENT_AMBULATORY_CARE_PROVIDER_SITE_OTHER): Payer: Medicare PPO | Admitting: Psychiatry

## 2023-01-17 DIAGNOSIS — F4001 Agoraphobia with panic disorder: Secondary | ICD-10-CM | POA: Diagnosis not present

## 2023-01-17 DIAGNOSIS — F5105 Insomnia due to other mental disorder: Secondary | ICD-10-CM

## 2023-01-17 DIAGNOSIS — R7989 Other specified abnormal findings of blood chemistry: Secondary | ICD-10-CM | POA: Diagnosis not present

## 2023-01-17 DIAGNOSIS — F331 Major depressive disorder, recurrent, moderate: Secondary | ICD-10-CM | POA: Diagnosis not present

## 2023-01-17 DIAGNOSIS — F411 Generalized anxiety disorder: Secondary | ICD-10-CM

## 2023-01-17 DIAGNOSIS — F338 Other recurrent depressive disorders: Secondary | ICD-10-CM

## 2023-01-17 DIAGNOSIS — M797 Fibromyalgia: Secondary | ICD-10-CM | POA: Diagnosis not present

## 2023-01-17 DIAGNOSIS — G4721 Circadian rhythm sleep disorder, delayed sleep phase type: Secondary | ICD-10-CM | POA: Diagnosis not present

## 2023-01-17 MED ORDER — GABAPENTIN 100 MG PO CAPS: 100.0000 mg | ORAL_CAPSULE | Freq: Every day | ORAL | 0 refills | Status: DC

## 2023-01-17 MED ORDER — ESZOPICLONE 2 MG PO TABS: 2.0000 mg | ORAL_TABLET | Freq: Every evening | ORAL | 5 refills | Status: DC | PRN

## 2023-01-17 MED ORDER — DULOXETINE HCL 30 MG PO CPEP
90.0000 mg | ORAL_CAPSULE | Freq: Every day | ORAL | 3 refills | Status: DC
Start: 2023-01-17 — End: 2024-01-20

## 2023-01-17 NOTE — Progress Notes (Signed)
Andrea Simmons [id number] Jan 10, 1941 82 y.o.  Subjective:   Patient ID:  Andrea Simmons is an 82 y.o. (DOB November 25, 1940) female.  Chief Complaint:  Chief Complaint  Patient presents with   Follow-up   Depression   Anxiety   Sleeping Problem    Depression        Associated symptoms include fatigue and myalgias.  Associated symptoms include no decreased concentration, no appetite change and no suicidal ideas.  Past medical history includes anxiety.   Anxiety Symptoms include nervous/anxious behavior. Patient reports no confusion, decreased concentration, palpitations or suicidal ideas.      Andrea Simmons presents  today for recent exacerbation of depression..    At visit was July 09, 2018.  Vraylar was discontinued for lack of response.  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  This was partially helpful.  When  seen Aug 15, 2018 and Ritalin was increased to 15 mg twice daily because of partial response at the lower dose.  At visit and of July 2020.  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  The Ritalin was increased again to 20 mg twice daily.  seen December 04, 2018 & 02/2019.  Doesn't like winter.  Seasonal depression and crying more.  There were no med changes.   11/17/19 appt with the following noted: BP been higher and seeing Card and having med changes. Stopped Vit D bc level was high. Some discussion about the Ritalin over the BP control. Some increase depression in part over health issues.  No marital problems.  No SI.  Issues with daughter are a stressor.  D said she was mean when D was growing up and was too strict.  Felt D was ungrateful and that she and H did the best they could do.  I can't get over that and has told D about this. Ritalin with less energizing benefit and less productive than she was..  Wears off around  supper time.  At night cannot break habit of snacks at night. Does not feel it kick in and wear off.  Back to old habits of staying up too late, now MN.  Needs 7-8 hours.  Always needed more than others.    I feel good taking it.  Most days feels pretty good.  Can have crying spells without reason even before the Ritalin.   Doing much more than I was.   H still sick often too. Plan: Reduce Ritalin to 10 mg twice daily due to loss of benefit and blood pressure Start change from duloxetine to Trintellix to help depression: Reduce duloxetine to 3 of 30 mg capsules and start Trintellix 5 mg daily for 1 week, Then reduce duloxetine to 2 of the 30 mg capsules and increase Trintellix to 10 mg daily for 1 week, Then increase Trintellix to 10 + 5 mg tablets and reduce duloxetine to 1 of the 30 mg capsules for 1 week, Then stop Duloxetine and increase Trintellix to 20 mg daily (or 2 of the 10 mg tablets)  01/14/2020 appointment with the following noted: Is on Trintellix 20 mg daily since 12/08/19 approximately. Was really hard with the switch.  Got really low and now some better.  Had a lot of aches and fatigue for awhile for 5 weeks.  Wonders if she was sick.  She doesn't feel like Trintellix is  the right med. Gained 14# and still on low dose Ritalin.  Still having crying spells and poor energy but some days feels pretty good. No nausea or other SE noticed. A lot of worry over her health and BP and H's cellulitis again. Plan: Overall patient has not improved with switch from duloxetine to Trintellix.  Her energy is not better and her mood is not better.  She is also quite anxious. Reduce Trintellix to 10 mg 1 daily and start viibryd 10 mg daily for a week, then  Stop Trintellix and increase Viibryd to 20 mg daily with food.  tC 01/23/20 wanting to stop Ritalin so she did.  01/30/20 TC : LM:  Suggested she restart vitamin D 2000 units daily.  Her vitamin D level is a little low and we would like to see it  in the 50s.  It is hoped that dose will push back into the 50s and if there is a question we will recheck it in 3 months. She has not been on Viibryd 20 mg long enough to see the full benefit of that.  She needs to continue Viibryd 20 mg for at least a full 4 weeks at that dosage so another 3 weeks or so.  At that point we could consider increasing it if needed.   03/10/20 appt with following noted: Ritalin didn't help and might have increased BP per card. On Viibryd about 6 weeks at 20 mg. Read on viibryd  And concerned about taking with coumadin. Gotten where she can't sleep well.  Last week only averaging 3 hours per night.  Also irritable a lot in last 10 days. Gained 17#. Hungry. Worries about things more than in the past and some of it is age. Wynelle Link and Monday diarrhea.  Otherwise just at times. Plan: DC Ritalin per her request. Poor response so far with Viibryd. Increase Viibryd to 30 mg for 1 week, then 40 mg daily.  04/28/2020 appt with following noted: A lot better but far from where I need to be. Still depressed. Can't sleep.  Catnap.  Taking viibryd in morning 40 mg for 5 weeks. Increase Viibryd to 40 mg daily.   3-4 hours sleep and no naps.  Not drowsy daytime. Ongoing tiredness chronically. Feels more tense and irritable from not sleeping.  Plan: continue Viibryd 40  06/17/2020 appt with following noted: vIIBRYD not helping. Trazodone 25 mg hallucinated bugs and screamed. H not doing well and that hasn't helped mood. Body aches and hurts all over. Asks about return to Cymbalta. Still gaining weight. Broken sleep with total of 6-8 hours Plan: CO poor effect with Viibryd and duloxetine did help FM more and pain. Wean Viibryd and restart duloxetine and increase to 90 mg daily (*took 120 mg before) Reduce Viibryd to 20 mg daily and start 1 of the duloxetine 30 mg daily for 5 days, Then continue Viibryd 20 mg daily and increase duloxetine to 2 of the 30 mg daily for 5  days, Then reduce Viibryd to 10 mg daily and increase duloxetine to 3 of the 30 mg daily for 5 days, Then stop Viibryd and continue duloxetine 3 daily. Rec trial mirtazapine 7.5 mg HS.  08/12/2020 appointment with the following noted: I'm doing better with Cymbalta 90. "much, much better" Mirtazapine didn't work for Andrea Simmons but is sleeping now.  Laid down at MN and to sleep 2 A and up 845. Arthritis is bad and dx gout.  Joints in hands are bad.  Anxiety manageable.  Depression got really bad before the switch in meds.  Pleased with result.  Seasonal depression starts in fall and until spring. Busy and worked with flowers. Pretty good now. Plan no med changes.  Continue duloxetine 90 mg daily and mirtazapine for sleep  12/15/2020 appointment with the following noted: I'm doing really good.  Cymbalta is a success.  She's not worried about winter.   Annia Friendly has not been well with cellulitis.  She's been through health problems.  Handles stressors well.   CO still poor sleep even with mirtazapine.  Will have 2-3 nights ok and other nights EFA.  Wants to sleep 11-8.  Seems she can't get relaxed and will worry at night.  Some nights pain interferes. Plan: Better with switch back to duloxetine 90 mg daily (*took 120 mg before) Okay trial of Lunesta 2 mg nightly with mirtazapine 7.5 mg nightly for chronic insomnia.  03/16/2021 appointment with the following noted: "Wonderful" cp to last year was depressed.  Pleased with duloxetine. May go 2-3 nights with poor sleep per week. Hangover if use Lunesta 2 with mirtazapine 7.5.  So stopped mirtazapine.  Alfonso Patten works sometime but no SE with it. D positive for Covid and didn't visit for Xmas. Plan: DC mirtazapine 7.5 mg HS DT poor response and hangover Belsomra samples given  09/14/21 appt noted: Belsomra NR and NM and none now. Can be startled when she awakens at times and did with Belsomra. Trouble getting to sleep then awakens with pain. Some  nights almost no sleep Energy better with change in BP meds. Couple of falls since here. No depression despite health issues. Plan: Continue duloxetine 90 mg daily and Lunesta 2 mg nightly as needed insomnia  03/30/22 appt noted: Unfortunately H Chester fell and broke back and hasn't been able to come home yet.  Been at 3 different places.  He's in good spirits.  At [facility] living. He's 83 yo.  Was active until he fell at home.  He can't walk and is incontinet.  His mind is clear. She's surprised at how well she has held up with the stress.  No sig seasonal depression so far.   Never been a person that sleeps well at night. Stress.    01/17/23 appt noted: Psych meds: duloxetine 90, Lunesta 2 mg HS Balance px intermittent with tendency to go to R side.   H chester still dealing with back px.  In 4 facilites since fall a year ago.  Into skilled nursing care now.  Can't walk and incontinent.  In hospital with "rare infection".  So upset over Wil[place]ding 82 yo and health problems. FM and chronic insomnia wearing her out.   When out of Lunesta sleep is even worse, but still some nights no sleep.  Chronic leg cramps.  Chronic insomnia.   D and GS don't live near.  No one to help her.   FM pain is almost constant now.  Used to be episodic.   Don't want to stop duloxetine.  Some $ stress .    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   Past Psychiatric Medication Trials: Rozerem,  Trazodone severe SE, mirtazapine 7.5 hangover,  Lunesta 2 , zolpidem Belsomra NM  Duloxetine 120, Paxil 20 for years, Sertraline, fluoxetine, Wellbutrin,   Trintellix 20 5 weeks NR.  Viibryd 40  NR,  Low dose nortriptyline, Hx gabapentin 300 and had amnesia  Abilify weight gain and loss benefit, Was More talkative on  the Abilify and the Wellbutrin.  lithium 2004 SE tremor at 600mg  daily.   Vraylar 40 NR, Buspar NR.  Review of Systems:  Review of Systems  Constitutional:   Positive for fatigue. Negative for appetite change and unexpected weight change.  HENT:  Negative for sinus pain.   Cardiovascular:  Negative for palpitations.  Gastrointestinal:  Negative for diarrhea.  Musculoskeletal:  Positive for arthralgias, back pain, joint swelling and myalgias. Negative for neck stiffness.  Neurological:  Negative for tremors and numbness.  Psychiatric/Behavioral:  Positive for dysphoric mood and sleep disturbance. Negative for agitation, behavioral problems, confusion, decreased concentration, hallucinations, self-injury and suicidal ideas. The patient is nervous/anxious. The patient is not hyperactive.    Hx anemia.    Cardiologist said she had a stiff heart.  History of pulmonary embolism and on Coumadin.  Medications: I have reviewed the patient's current medications.  Current Outpatient Medications  Medication Sig Dispense Refill   ACCU-CHEK GUIDE test strip CHECK BLOOD SUGARS DAILY 100 strip 12   Accu-Chek Softclix Lancets lancets USE AS INSTRUCTED TO TEST SUGARS TWICE A DAY 100 each 12   amLODipine (NORVASC) 10 MG tablet TAKE 1 TABLET BY MOUTH EVERY DAY 90 tablet 2   Biotin 1000 MCG tablet Take 1,000 mcg by mouth daily.     Blood Glucose Monitoring Suppl (ACCU-CHEK GUIDE) w/Device KIT 1 each by Does not apply route 2 (two) times daily. To test sugars. Dx. E11.9 1 kit 1   fenofibrate (TRICOR) 48 MG tablet Take 48 mg by mouth daily.     ferrous sulfate 325 (65 FE) MG tablet TAKE 1 TABLET BY MOUTH EVERY DAY 90 tablet 1   FLAREX 0.1 % ophthalmic suspension Apply to eye.     furosemide (LASIX) 20 MG tablet TAKE 1 TABLET BY MOUTH EVERY DAY AS NEEDED 90 tablet 1   gabapentin (NEURONTIN) 100 MG capsule Take 1 capsule (100 mg total) by mouth at bedtime. 90 capsule 0   hydrALAZINE (APRESOLINE) 100 MG tablet TAKE 1 TABLET BY MOUTH TWICE A DAY 180 tablet 03   labetalol (NORMODYNE) 200 MG tablet TAKE 1 TABLET BY MOUTH TWICE A DAY 180 tablet 2   levothyroxine  (SYNTHROID) 25 MCG tablet TAKE 1 TABLET BY MOUTH EVERY DAY BEFORE BREAKFAST 90 tablet 1   linagliptin (TRADJENTA) 5 MG TABS tablet Take 5 mg by mouth daily.     loteprednol (LOTEMAX) 0.5 % ophthalmic suspension SMARTSIG:In Eye(s)     Magnesium 500 MG TABS Take 500 mg by mouth daily.     rosuvastatin (CRESTOR) 20 MG tablet Take 20 mg by mouth daily.     valsartan (DIOVAN) 320 MG tablet TAKE 1 TABLET BY MOUTH EVERY DAY 90 tablet 2   warfarin (COUMADIN) 5 MG tablet TAKE 1/2 TABLET (2.5 MG) BY MOUTH DAILY EXCEPT TAKE 1 TABLET (5 MG) ON MONDAYS, WEDNESDAYS AND SATURDAYS OR AS DIRECTED BY ANTICOAGULATION CLINIC 75 tablet 2   DULoxetine (CYMBALTA) 30 MG capsule Take 3 capsules (90 mg total) by mouth daily. 270 capsule 3   eszopiclone (LUNESTA) 2 MG TABS tablet Take 1 tablet (2 mg total) by mouth at bedtime as needed for sleep. Take immediately before bedtime 30 tablet 5   No current facility-administered medications for this visit.    Medication Side Effects: None talkative more the [facility]  Allergies:  Allergies  Allergen Reactions   Lipitor [Atorvastatin]     Unknown   Morphine Nausea And Vomiting   Meperidine Nausea And Vomiting   Naproxen  Sodium Nausea And Vomiting    Past Medical History:  Diagnosis Date   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) 05/15/2022   Anxiety    Breast cancer (HCC)    Chronic osteoarthritis    CKD stage 3b, GFR 30-44 ml/min (HCC) 01/25/2021   Depression    Diabetes mellitus type II    Diabetes type 2, controlled (HCC) 08/27/2008   Qualifier: Diagnosis of   By: Jonny Ruiz MD, Len Blalock        Essential hypertension 03/20/2007   Qualifier: Diagnosis of   By: Yetta Barre RN, CGRN, Sheri         Exertional dyspnea 11/09/2008   11/20/2017  Walked RA x 3 laps @ 185 ft each stopped due to  End of study, fast pace, no desat, min sob   - Echo 12/05/2017 - LVEF 60-65%, moderate LVH, normal wall motion, grade 1 DD,   indeterminate LV filling pressure, normal GLS at  -21%, normal LA   size, trivial TR, RVSP 20 mmHg, normal IVC.           Fibromyalgia    GERD (gastroesophageal reflux disease)    Hyperlipidemia    Hypertension    Hypothyroidism    Malignant neoplasm of breast (female), unspecified site 1993   L breast s/p mastectomy and tamoxifen x 12yrs   Osteoporosis    Other pulmonary embolism and infarction 2008 and 2009   chronic anticoag - [facility]    Family History  Problem Relation Age of Onset   Depression Other        Parent   Arthritis Other        Parent, Grandparent   Hypertension Other        Grandparent   Hyperlipidemia Other        FMH   Miscarriages / Stillbirths Other        Grandparent   Stroke Other        FMH   Cancer Maternal Uncle        prostate   Hypertension Maternal Grandfather    Breast cancer Cousin    Breast cancer Cousin     Social History   Socioeconomic History   Marital status: Married    Spouse name: Not on file   Number of children: 1   Years of education: Not on file   Highest education level: Not on file  Occupational History   Occupation: Software engineer: RETIRED   Occupation: Producer, television/film/video   Occupation: school bus driver  Tobacco Use   Smoking status: Never   Smokeless tobacco: Never  Vaping Use   Vaping status: Never Used  Substance and Sexual Activity   Alcohol use: No    Alcohol/week: 0.0 standard drinks of alcohol   Drug use: No   Sexual activity: Not Currently  Other Topics Concern   Not on file  Social History Narrative   Not on file   Social Determinants of Health   Financial Resource Strain: Low Risk  (11/16/2021)   Overall Financial Resource Strain (CARDIA)    Difficulty of Paying Living Expenses: Not hard at all  Food Insecurity: No Food Insecurity (02/20/2022)   Hunger Vital Sign    Worried About Running Out of Food in the Last Year: Never true    Ran Out of Food in the Last Year: Never true  Transportation Needs: No Transportation Needs (02/20/2022)    PRAPARE - Administrator, Civil Service (Medical):  No    Lack of Transportation (Non-Medical): No  Physical Activity: Insufficiently Active (11/16/2021)   Exercise Vital Sign    Days of Exercise per Week: 3 days    Minutes of Exercise per Session: 30 min  Stress: No Stress Concern Present (11/16/2021)   [employer] of Occupational Health - Occupational Stress Questionnaire    Feeling of Stress : Not at all  Social Connections: Moderately Isolated (11/16/2021)   Social Connection and Isolation Panel [NHANES]    Frequency of Communication with Friends and Family: More than three times a week    Frequency of Social Gatherings with Friends and Family: More than three times a week    Attends Religious Services: Never    Database administrator or Organizations: No    Attends Banker Meetings: Never    Marital Status: Married  Catering manager Violence: Not At Risk (11/16/2021)   Humiliation, Afraid, Rape, and Kick questionnaire    Fear of Current or Ex-Partner: No    Emotionally Abused: No    Physically Abused: No    Sexually Abused: No    Past Medical History, Surgical history, Social history, and Family history were reviewed and updated as appropriate.   Please see review of systems for further details on the patient's review from today.   Objective:   Physical Exam:  There were no vitals taken for this visit.  Physical Exam Constitutional:      General: She is not in acute distress.    Appearance: She is well-developed.  Musculoskeletal:        General: No deformity.  Neurological:     Mental Status: She is alert and oriented to person, place, and time.     Cranial Nerves: No dysarthria.     Coordination: Coordination normal.  Psychiatric:        Attention and Perception: Attention normal. She is attentive.        Mood and Affect: Mood is anxious. Mood is not depressed. Affect is tearful. Affect is not labile, blunt, angry or inappropriate.         Speech: Speech normal. Speech is not rapid and pressured.        Behavior: Behavior normal. Behavior is cooperative.        Thought Content: Thought content normal. Thought content is not paranoid or delusional. Thought content does not include homicidal or suicidal ideation. Thought content does not include suicidal plan.        Cognition and Memory: Cognition and memory normal.        Judgment: Judgment normal.     Comments: Insight fair-good.   Depression is OK so far.  Pleasant.   Talkative chronically Tearful over H's health and being alone.      Lab Review:     Component Value Date/Time   NA 137 10/08/2022 1232   NA 139 12/27/2020 1237   NA 141 08/01/2016 1527   K 4.4 10/08/2022 1232   K 4.6 08/01/2016 1527   CL 105 10/08/2022 1232   CO2 22 10/08/2022 1232   CO2 24 08/01/2016 1527   GLUCOSE 109 (H) 10/08/2022 1232   GLUCOSE 93 08/01/2016 1527   BUN 40 (H) 10/08/2022 1232   BUN 30 (H) 12/27/2020 1237   BUN 19.9 08/01/2016 1527   CREATININE 1.73 (H) 10/08/2022 1232   CREATININE 0.9 08/01/2016 1527   CALCIUM 9.6 10/08/2022 1232   CALCIUM 9.7 08/01/2016 1527   PROT 7.0 02/16/2022 1313   PROT 7.2  08/01/2016 1527   PROT 6.8 08/01/2016 1527   ALBUMIN 4.6 02/16/2022 1313   ALBUMIN 4.0 08/01/2016 1527   AST 29 02/16/2022 1313   AST 41 (H) 08/01/2016 1527   ALT 26 02/16/2022 1313   ALT 25 08/01/2016 1527   ALKPHOS 32 (L) 02/16/2022 1313   ALKPHOS 48 08/01/2016 1527   BILITOT 0.4 02/16/2022 1313   BILITOT 0.33 08/01/2016 1527   GFRNONAA 30 (L) 10/08/2022 1232   GFRAA 76 05/28/2019 1609       Component Value Date/Time   WBC 7.4 10/08/2022 1232   RBC 3.20 (L) 10/08/2022 1232   HGB 10.3 (L) 10/08/2022 1232   HGB 9.5 (L) 08/01/2016 1527   HCT 30.1 (L) 10/08/2022 1232   HCT 31.2 (L) 08/01/2016 1527   PLT 186 10/08/2022 1232   PLT 221 08/01/2016 1527   MCV 94.1 10/08/2022 1232   MCV 83.9 08/01/2016 1527   MCH 32.2 10/08/2022 1232   MCHC 34.2 10/08/2022 1232    RDW 12.0 10/08/2022 1232   RDW 17.2 (H) 08/01/2016 1527   LYMPHSABS 1.0 10/08/2022 1232   LYMPHSABS 2.0 08/01/2016 1527   MONOABS 0.7 10/08/2022 1232   MONOABS 0.4 08/01/2016 1527   EOSABS 0.1 10/08/2022 1232   EOSABS 0.1 08/01/2016 1527   BASOSABS 0.0 10/08/2022 1232   BASOSABS 0.0 08/01/2016 1527    No results found for: "POCLITH", "LITHIUM"   No results found for: "PHENYTOIN", "PHENOBARB", "VALPROATE", "CBMZ"   Endo checked thyroid lately.   Vitamin D level on April 01, 2018 reported as 40.  After that vitamin D 50,000 units weekly was prescribed with a goal of achieving levels in the 50s and 60s.  December 04, 2018 vitamin D 25 off supplement .res Assessment: Plan:    Andrea Simmons was seen today for follow-up, depression, anxiety and sleeping problem.  Diagnoses and all orders for this visit:  Major depressive disorder, recurrent episode, moderate (HCC) -     DULoxetine (CYMBALTA) 30 MG capsule; Take 3 capsules (90 mg total) by mouth daily.  Panic disorder with agoraphobia -     DULoxetine (CYMBALTA) 30 MG capsule; Take 3 capsules (90 mg total) by mouth daily.  Generalized anxiety disorder -     DULoxetine (CYMBALTA) 30 MG capsule; Take 3 capsules (90 mg total) by mouth daily.  Insomnia due to mental condition -     eszopiclone (LUNESTA) 2 MG TABS tablet; Take 1 tablet (2 mg total) by mouth at bedtime as needed for sleep. Take immediately before bedtime  Delayed sleep phase syndrome -     eszopiclone (LUNESTA) 2 MG TABS tablet; Take 1 tablet (2 mg total) by mouth at bedtime as needed for sleep. Take immediately before bedtime  Low vitamin D level  Seasonal depression (HCC) -     DULoxetine (CYMBALTA) 30 MG capsule; Take 3 capsules (90 mg total) by mouth daily.  Fibromyalgia -     gabapentin (NEURONTIN) 100 MG capsule; Take 1 capsule (100 mg total) by mouth at bedtime.    30 min face to face time with patient was spent on counseling and coordination of care.  Hx.TRD better again with duloxetine again.  We discussed options for dealing with this. If needed consider lamotrigine.  Pt hx mixed sx so consider unconventional alternatives.  Discussed potential benefits, risks, and side effects of stimulants with patient to include increased heart rate, palpitations, insomnia, increased anxiety, increased irritability, or decreased appetite.  Instructed patient to contact office if experiencing any significant tolerability  issues. She does not appear manic and not sig different from the last visit. Still talkative on it but not manic.  Better with switch back to duloxetine 90 mg daily (*took 120 mg before)  For FM can't use gabapentin 300 DT hx SE.  Therefore reluctant to try Lyrica. But option to try gabapentin 100 mg HS.  She wants to try DT severity FM pain.  Educated that she cannot expect to sleep solid through the night.  Explained.   Also disc sleep hygiene.   She's frustrated with sleep.  Trial of Bz bc worry is part of it but risk SE and risk tolerance.  Disc fall risk with sleep meds.  Have tried the safest meds. Disc timing of Lunesta but she insists on taking it early instead. Cont prn Lunesta 2 mg HS DT age.  Prefer empty stomach. She has chronic insomnia. Still not sleeping very well.  Supportive therapy dealing with duloxetine.  Repeat vitamin D level was 47.  Previously high.  Restarted 2000 units daily.      FU 6 mos or longer per her request  Meredith Staggers, MD, DFAPA   Please see After Visit Summary for patient specific instructions.  Future Appointments  Date Time Provider Department Center  01/18/2023  3:00 PM [facility] None  02/01/2023  2:00 PM [name], Anabel Bene, MD [facility]  02/06/2023  1:00 PM Terri Piedra, DO [facility] None  02/08/2023  1:55 PM Alver Sorrow, NP [facility]  02/13/2023  3:30 PM Waymon Budge, MD [facility] None  03/01/2023  1:30 PM Alyson Reedy, FNP [facility]    No orders of the defined types were placed in this encounter.      -------------------------------

## 2023-01-18 ENCOUNTER — Ambulatory Visit: Payer: Medicare PPO

## 2023-01-23 ENCOUNTER — Ambulatory Visit: Payer: Medicare PPO | Attending: Cardiovascular Disease

## 2023-01-23 DIAGNOSIS — Z86718 Personal history of other venous thrombosis and embolism: Secondary | ICD-10-CM | POA: Diagnosis not present

## 2023-01-23 DIAGNOSIS — I2699 Other pulmonary embolism without acute cor pulmonale: Secondary | ICD-10-CM | POA: Diagnosis not present

## 2023-01-23 DIAGNOSIS — Z5181 Encounter for therapeutic drug level monitoring: Secondary | ICD-10-CM

## 2023-01-23 LAB — POCT INR: INR: 2.8 (ref 2.0–3.0)

## 2023-01-23 NOTE — Patient Instructions (Signed)
Continue taking 1 tablet Daily, except 0.5 tablet on Monday, Wednesday and Friday.   Recheck INR in 4 weeks. [facility]  [phone]

## 2023-01-29 ENCOUNTER — Telehealth: Payer: Self-pay

## 2023-01-29 NOTE — Telephone Encounter (Signed)
Patient was seen on 10/30 and started on gabapentin 100 mg. She said she is not sleeping.  She hasn't slept more than 3 hours since starting it and she isn't sleeping during the day. She reports having some pain in her leg from an injury in 2023 and also some shoulder pain that seems to be worse since starting the gabapentin, it brings the pain out. She also said she had some left-sided chest pain, but felt it was radiation from the shoulder pain and that has improved. She didn't take gabapentin last night. She said it just isn't going to work.   For FM can't use gabapentin 300 DT hx SE.  Therefore reluctant to try Lyrica. But option to try gabapentin 100 mg HS.  She wants to try DT severity FM pain.

## 2023-01-29 NOTE — Telephone Encounter (Signed)
Ok to stop gabapentin.  No need to taper.  Sorry it didn't help.  I don't have anything else to offer for pain.

## 2023-01-29 NOTE — Telephone Encounter (Signed)
Patient notified

## 2023-02-01 ENCOUNTER — Ambulatory Visit: Payer: Self-pay | Admitting: Cardiology

## 2023-02-06 ENCOUNTER — Ambulatory Visit: Payer: Medicare PPO | Admitting: Dermatology

## 2023-02-08 ENCOUNTER — Ambulatory Visit (HOSPITAL_BASED_OUTPATIENT_CLINIC_OR_DEPARTMENT_OTHER): Payer: Medicare PPO | Admitting: Family

## 2023-02-08 ENCOUNTER — Encounter (HOSPITAL_BASED_OUTPATIENT_CLINIC_OR_DEPARTMENT_OTHER): Payer: Self-pay | Admitting: Family Medicine

## 2023-02-08 ENCOUNTER — Encounter (HOSPITAL_BASED_OUTPATIENT_CLINIC_OR_DEPARTMENT_OTHER): Payer: Self-pay | Admitting: Family

## 2023-02-08 VITALS — BP 125/63 | HR 76 | Ht 62.0 in | Wt 139.6 lb

## 2023-02-08 DIAGNOSIS — G4733 Obstructive sleep apnea (adult) (pediatric): Secondary | ICD-10-CM

## 2023-02-08 DIAGNOSIS — I10 Essential (primary) hypertension: Secondary | ICD-10-CM

## 2023-02-08 NOTE — Patient Instructions (Addendum)
Medication Instructions:  Continue your current medications   Labwork: Your blood work showed there was no secondary condition causing your blood pressure which is good!    Follow-Up: follow up in 2 months with [facility]    Special Instructions:   Tips to Measure your Blood Pressure Correctly  Recommend checking your blood pressure at least an hour after you take your morning medications. You could even check it in the afternoon.  Sit and rest for 5-10 minutes prior to checking your blood pressure.   Here's what you can do to ensure a correct reading:  Don't drink a caffeinated beverage or smoke during the 30 minutes before the test.  Sit quietly for five minutes before the test begins.  During the measurement, sit in a chair with your feet on the floor and your arm supported so your elbow is at about heart level.  The inflatable part of the cuff should completely cover at least 80% of your upper arm, and the cuff should be placed on bare skin, not over a shirt.  Don't talk during the measurement.   Blood pressure categories  Blood pressure category SYSTOLIC (upper number)  DIASTOLIC (lower number)  Normal Less than 120 mm Hg and Less than 80 mm Hg  Elevated 120-129 mm Hg and Less than 80 mm Hg  High blood pressure: Stage 1 hypertension 130-139 mm Hg or 80-89 mm Hg  High blood pressure: Stage 2 hypertension 140 mm Hg or higher or 90 mm Hg or higher  Hypertensive crisis (consult your doctor immediately) Higher than 180 mm Hg and/or Higher than 120 mm Hg  Source: American Heart Association and American Stroke Association. For more on getting your blood pressure under control, buy Controlling Your Blood Pressure, a Special Health Report from [facility].

## 2023-02-08 NOTE — Progress Notes (Signed)
[facility] Assessment:    Date:  02/08/2023   ID:  TAHLEA Simmons, DOB 01-29-1941, MRN [id number]  PCP:  Alyson Reedy, FNP  Cardiologist:  None  Nephrologist:  Referring MD: Alyson Reedy, FNP   CC: Hypertension  History of Present Illness:    Andrea Simmons is a 82 y.o. 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   Prior 01/2022 normal TSH. Established with Dr. Duke Salvia and [facility] 11/2022. Noted BP at home were far higher than they were in clinic. 24h BP cuff ordered but not performed. Labs were negative for pheochromocytoma. Cortisol, renin-aldosterone were unremarkable.   Presents today for follow up independently. Notes stressor as her husband is at [facility] for care as is "bascically paralyzed" after a back break. Notes persistent fatigue which has worsened. She wakes up tired. Notes "I don't sleep worth nothing" only sleeping 1-3 hours. Tries not to take a nap during the day. Often wakes up with pain from her fibromyalgia. Completed sleep study with pulmonology and has upcoming appointment with Dr. Maple Hudson to discuss results. She does have concern she might not tolerate CPAP if needed but would be interested in [place] if she was a candidate.   Reviewed BP log from home, below. Does not sit and rest prior to BP readings. Gets out of the bed, takes her thyroid medication, then walks to her spare room and sits down to check her blood pressure. She takes her BP medication after breakfast betweek 8a-9a.   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) 05/15/2022   Anxiety    Breast cancer (HCC)    Chronic osteoarthritis    CKD stage 3b, GFR 30-44 ml/min (HCC) 01/25/2021   Depression    Diabetes mellitus type II    Diabetes type 2, controlled (HCC) 08/27/2008   Qualifier: Diagnosis of   By: Jonny Ruiz MD, Len Blalock        Essential hypertension 03/20/2007   Qualifier: Diagnosis of   By: Yetta Barre RN, CGRN, Sheri         Exertional dyspnea 11/09/2008   11/20/2017  Walked RA x 3 laps @ 185 ft each stopped due to  End of study, fast pace, no desat, min sob   - Echo 12/05/2017 - LVEF 60-65%, moderate LVH, normal wall motion, grade 1 DD,   indeterminate LV filling pressure, normal GLS at -21%, normal LA   size, trivial TR, RVSP 20 mmHg, normal IVC.           Fibromyalgia    GERD (gastroesophageal reflux disease)    Hyperlipidemia    Hypertension    Hypothyroidism    Malignant neoplasm of breast (female), unspecified site 1993   L breast s/p mastectomy and tamoxifen x 79yrs   Osteoporosis    Other pulmonary embolism and infarction 2008 and 2009   chronic anticoag - [facility]    Past Surgical History:  Procedure Laterality Date   APPENDECTOMY     BALLOON DILATION N/A 10/18/2020   Procedure: BALLOON DILATION;  Surgeon: Lanelle Bal, DO;  [facility];  Service: Endoscopy;  Laterality: N/A;   BIOPSY  10/18/2020   Procedure: BIOPSY;  Surgeon: Lanelle Bal, DO;  [facility];  Service: Endoscopy;;  gastric    BREAST BIOPSY Left 1993   BREAST BIOPSY Right 09/25/2014   stero. Benign   BREAST RECONSTRUCTION Left    CATARACT EXTRACTION     ESOPHAGOGASTRODUODENOSCOPY (EGD) WITH PROPOFOL N/A 10/18/2020   Procedure: ESOPHAGOGASTRODUODENOSCOPY (EGD) WITH PROPOFOL;  Surgeon: Lanelle Bal, DO;  Location[facility];  Service: Endoscopy;  Laterality: N/A;  11:00am   MASTECTOMY Left    ROTATOR CUFF REPAIR     SPINAL FUSION      x 2   TOTAL ABDOMINAL HYSTERECTOMY W/  BILATERAL SALPINGOOPHORECTOMY     TUBAL LIGATION      Current Medications: Current Meds  Medication Sig   ACCU-CHEK GUIDE test strip CHECK BLOOD SUGARS DAILY   Accu-Chek Softclix Lancets lancets USE AS INSTRUCTED TO TEST SUGARS TWICE A DAY   amLODipine (NORVASC) 10 MG tablet TAKE 1 TABLET BY MOUTH EVERY DAY   Biotin 1000 MCG tablet Take 1,000 mcg by mouth daily.   Blood Glucose Monitoring Suppl (ACCU-CHEK GUIDE) w/Device KIT 1 each by Does not apply route 2 (two) times daily. To test sugars. Dx. E11.9   DULoxetine (CYMBALTA) 30 MG capsule Take 3 capsules (90 mg total) by mouth daily.   eszopiclone (LUNESTA) 2 MG TABS tablet Take 1 tablet (2 mg total) by mouth at bedtime as needed for sleep. Take immediately before bedtime   fenofibrate (TRICOR) 48 MG tablet Take 48 mg by mouth daily.   ferrous sulfate 325 (65 FE) MG tablet TAKE 1 TABLET BY MOUTH EVERY DAY   FLAREX 0.1 % ophthalmic suspension Apply to eye.   furosemide (LASIX) 20 MG tablet TAKE 1 TABLET BY MOUTH EVERY DAY AS NEEDED   hydrALAZINE (APRESOLINE) 100 MG tablet TAKE 1 TABLET BY MOUTH TWICE A DAY   labetalol (NORMODYNE) 200 MG tablet TAKE 1 TABLET BY MOUTH TWICE A DAY   levothyroxine (SYNTHROID) 25 MCG tablet TAKE 1 TABLET BY MOUTH EVERY DAY BEFORE BREAKFAST   linagliptin (TRADJENTA) 5 MG TABS tablet Take 5 mg by mouth daily.   loteprednol (LOTEMAX) 0.5 % ophthalmic suspension SMARTSIG:In Eye(s)   Magnesium 500 MG TABS Take 500 mg by mouth daily.   rosuvastatin (CRESTOR) 20 MG tablet Take 20 mg by mouth daily.   valsartan (DIOVAN) 320 MG tablet TAKE 1 TABLET BY MOUTH EVERY DAY   warfarin (COUMADIN) 5 MG tablet TAKE 1/2 TABLET (2.5 MG) BY MOUTH DAILY EXCEPT TAKE 1 TABLET (5 MG) ON MONDAYS, WEDNESDAYS AND SATURDAYS OR AS DIRECTED BY ANTICOAGULATION CLINIC     Allergies:   Lipitor [atorvastatin], Morphine, Meperidine, and Naproxen sodium   Social History   Socioeconomic History   Marital status: Married    Spouse name: Not on  file   Number of children: 1   Years of education: Not on file   Highest education level: Not on file  Occupational History   Occupation: Software engineer: RETIRED   Occupation: Producer, television/film/video   Occupation: school bus driver  Tobacco Use   Smoking status: Never   Smokeless tobacco: Never  Advertising account planner   Vaping status: Never Used  Substance and Sexual Activity   Alcohol use: No    Alcohol/week: 0.0 standard drinks of alcohol   Drug use: No   Sexual activity: Not Currently  Other Topics Concern   Not on  file  Social History Narrative   Not on file   Social Determinants of Health   Financial Resource Strain: Low Risk  (11/16/2021)   Overall Financial Resource Strain (CARDIA)    Difficulty of Paying Living Expenses: Not hard at all  Food Insecurity: No Food Insecurity (02/20/2022)   Hunger Vital Sign    Worried About Running Out of Food in the Last Year: Never true    Ran Out of Food in the Last Year: Never true  Transportation Needs: No Transportation Needs (02/20/2022)   PRAPARE - Administrator, Civil Service (Medical): No    Lack of Transportation (Non-Medical): No  Physical Activity: Insufficiently Active (11/16/2021)   Exercise Vital Sign    Days of Exercise per Week: 3 days    Minutes of Exercise per Session: 30 min  Stress: No Stress Concern Present (11/16/2021)   [employer] of Occupational Health - Occupational Stress Questionnaire    Feeling of Stress : Not at all  Social Connections: Moderately Isolated (11/16/2021)   Social Connection and Isolation Panel [NHANES]    Frequency of Communication with Friends and Family: More than three times a week    Frequency of Social Gatherings with Friends and Family: More than three times a week    Attends Religious Services: Never    Database administrator or Organizations: No    Attends Engineer, structural: Never    Marital Status: Married     Family History: The patient's family  history includes Arthritis in an other family member; Breast cancer in her cousin and cousin; Cancer in her maternal uncle; Depression in an other family member; Hyperlipidemia in an other family member; Hypertension in her maternal grandfather and another family member; Miscarriages / [place] in an other family member; Stroke in an other family member.  ROS:   Please see the history of present illness.     All other systems reviewed and are negative.  EKGs/Labs/Other Studies Reviewed:         Recent Labs: 02/16/2022: ALT 26; TSH 0.41 10/08/2022: BUN 40; Creatinine, Ser 1.73; Hemoglobin 10.3; Platelets 186; Potassium 4.4; Sodium 137   Recent Lipid Panel    Component Value Date/Time   CHOL 148 02/16/2022 1313   TRIG 105.0 02/16/2022 1313   TRIG 256 04/19/2009 0000   HDL 56.80 02/16/2022 1313   CHOLHDL 3 02/16/2022 1313   VLDL 21.0 02/16/2022 1313   LDLCALC 70 02/16/2022 1313   LDLDIRECT 83.0 08/01/2021 1427    Physical Exam:   VS:  BP 125/63 (BP Location: Left Arm, Patient Position: Sitting, Cuff Size: Normal)   Pulse 76   Ht 5\' 2"  (1.575 m)   Wt 139 lb 9.6 oz (63.3 kg)   SpO2 98%   BMI 25.53 kg/m  , BMI Body mass index is 25.53 kg/m. GENERAL:  Well appearing HEENT: Pupils equal round and reactive, fundi not visualized, oral mucosa unremarkable NECK:  No jugular venous distention, waveform within normal limits, carotid upstroke brisk and symmetric, no bruits, no thyromegaly LYMPHATICS:  No cervical adenopathy LUNGS:  Clear to auscultation bilaterally HEART:  RRR.  PMI not displaced or sustained,S1 and S2 within normal limits, no S3, no S4, no clicks, no rubs, no murmurs ABD:  Flat, positive bowel sounds normal in frequency in pitch, no bruits, no rebound, no guarding, no midline pulsatile mass, no hepatomegaly, no splenomegaly EXT:  2 plus pulses throughout, no edema, no cyanosis no clubbing SKIN:  No rashes no  nodules NEURO:  Cranial nerves II through XII grossly  intact, motor grossly intact throughout PSYCH:  Cognitively intact, oriented to person place and time   ASSESSMENT/PLAN:    HTN - BP at goal <130/80. Suspect higher readings at home are as she is checking prior to medications. Home BP cuff found to be accurate today. Continue present antihypertensive regimen Amlodipine 10mg  daily, Lasix 20mg  daily, Hydralazine BID, Labetolol 200mg  BID, Valsartan 320mg  daily. Instructed to check BP after medications, will follow up via phone call in 1-2 weeks.  OSA - upcoming follow up with Dr. Maple Hudson. Concerns about CPAP mask and would be agreeable to [facility]. Encouraged her to discuss with Dr. Maple Hudson at her upcoming visit.   Screening for Secondary Hypertension:     Relevant Labs/Studies:    Latest Ref Rng & Units 10/08/2022   12:32 PM 05/02/2022    1:08 AM 05/02/2022   12:00 AM  Basic Labs  Sodium 135 - 145 mmol/L 137  136    Potassium 3.5 - 5.1 mmol/L 4.4  4.2    Creatinine 0.44 - 1.00 mg/dL 5.62  1.30  1.5         This result is from an external source.       Latest Ref Rng & Units 02/16/2022    1:13 PM 08/01/2021    2:27 PM  Thyroid   TSH 0.35 - 5.50 uIU/mL 0.41  1.59        Latest Ref Rng & Units 12/04/2022    9:36 AM  Renin/Aldosterone   Aldosterone 0.0 - 30.0 ng/dL 3.9   Aldos/Renin Ratio 0.0 - 30.0 1.0        Latest Ref Rng & Units 12/04/2022    9:36 AM  Metanephrines/Catecholamines   Epinephrine 0 - 62 pg/mL <15   Norepinephrine 0 - 874 pg/mL 1,677   Dopamine 0 - 48 pg/mL 101   Metanephrines 0.0 - 88.0 pg/mL <25.0   Normetanephrines  0.0 - 285.2 pg/mL 321.2            Disposition:    FU with MD/PharmD in 2 months    Medication Adjustments/Labs and Tests Ordered: Current medicines are reviewed at length with the patient today.  Concerns regarding medicines are outlined above.  No orders of the defined types were placed in this encounter.  No orders of the defined types were placed in this encounter.    Signed, Alver Sorrow, NP  02/08/2023 2:30 PM    [facility]

## 2023-02-09 ENCOUNTER — Encounter (HOSPITAL_BASED_OUTPATIENT_CLINIC_OR_DEPARTMENT_OTHER): Payer: Self-pay | Admitting: Internal Medicine

## 2023-02-12 DIAGNOSIS — H1045 Other chronic allergic conjunctivitis: Secondary | ICD-10-CM | POA: Diagnosis not present

## 2023-02-13 ENCOUNTER — Ambulatory Visit: Payer: Medicare PPO | Admitting: Internal Medicine

## 2023-02-20 ENCOUNTER — Ambulatory Visit: Payer: Medicare PPO

## 2023-02-20 ENCOUNTER — Telehealth (HOSPITAL_BASED_OUTPATIENT_CLINIC_OR_DEPARTMENT_OTHER): Payer: Self-pay | Admitting: *Deleted

## 2023-02-20 NOTE — Telephone Encounter (Signed)
 LVM to schedule medicare wellness visit

## 2023-02-22 ENCOUNTER — Telehealth (HOSPITAL_BASED_OUTPATIENT_CLINIC_OR_DEPARTMENT_OTHER): Payer: Self-pay

## 2023-02-22 NOTE — Telephone Encounter (Signed)
Called patient to check in on blood pressure. Patient gave the following readings:  11/26: 137/77 60 11/27: 156/80 54 11/28: forgot to take blood pressure 11/29: 165/83 65 11/30: 149/79 64 12/1: 163/84 62 12/2: 147/77 65 12/3: 153/76 66 12/4: 163/89 60 12/5: 153/76 56  No headaches noted per patient. Will route to APP for review.

## 2023-02-22 NOTE — Telephone Encounter (Signed)
BP cuff previously found to be accurate but BP was controlled in clinic visit. Can we confirm she is sitting and resting 5-10 minutes prior to checking her BP and waiting at least 1 hour after taking her medications prior to checking BP?  TY!

## 2023-02-22 NOTE — Telephone Encounter (Signed)
-----   Message from Nurse Rontae Inglett C sent at 02/08/2023  2:59 PM EST ----- Regarding: Check on Blood pressure Call patient to check on blood pressures. :)

## 2023-02-23 ENCOUNTER — Other Ambulatory Visit: Payer: Self-pay | Admitting: Family Medicine

## 2023-02-23 ENCOUNTER — Telehealth: Payer: Self-pay | Admitting: Cardiovascular Disease

## 2023-02-23 NOTE — Telephone Encounter (Signed)
Pt is returning call to nurse. She will be home til about 59

## 2023-02-23 NOTE — Telephone Encounter (Signed)
Called patient and discussed medication recommendation. She verbalized understanding.   Recommend adjust Hydralazine to 100mg  (one tablet) in the morning, 50mg  (half tablet) in the afternoon, 100mg  at bedtime. Monitor BP at home and report back in 2 weeks.    Alver Sorrow, NP

## 2023-02-23 NOTE — Telephone Encounter (Signed)
Recommend adjust Hydralazine to 100mg  (one tablet) in the morning, 50mg  (half tablet) in the afternoon, 100mg  at bedtime. Monitor BP at home and report back in 2 weeks.   Alver Sorrow, NP

## 2023-02-23 NOTE — Telephone Encounter (Signed)
Called patient and verified that BPs were taken at least 1 hour after and at rest before taking blood pressures. She agreed that they were but also has been having trouble falling asleep at night; concerned that might effect her blood pressure. She has already contacted her PCP about this. Will route to APP.

## 2023-02-23 NOTE — Telephone Encounter (Signed)
See encounter note 02/23/23. Called patient via phone and discussed new med recs from APP.

## 2023-02-26 ENCOUNTER — Ambulatory Visit (HOSPITAL_BASED_OUTPATIENT_CLINIC_OR_DEPARTMENT_OTHER): Payer: Self-pay | Admitting: Family Medicine

## 2023-02-26 ENCOUNTER — Telehealth: Payer: Self-pay | Admitting: Cardiovascular Disease

## 2023-02-26 NOTE — Telephone Encounter (Signed)
Called patient and advised patient to call primary care in regards to possible cystitis. Pt is not on [place] or [place]. She verbalized understanding. PCP number given to patient.

## 2023-02-26 NOTE — Telephone Encounter (Signed)
   Chief Complaint: urinary urgency Symptoms: urinary urgency, pain with urination, straining to urinate Pertinent Negatives: Patient denies fever, flank pain, bloodin urine Disposition: [] ED /[] Urgent Care (no appt availability in office) / [x] Appointment(In office/virtual)/ []  [place] [place] [facility]/ [] Home Care/ [] Refused Recommended Disposition /[] [place] [facility]/ []  Follow-up with PCP Additional Notes: Patient reports she has been experiencing urinary urgency, pain with urination, and straining to urinate since Saturday. Patient reports she has a history of cystitis and is unsure if this is a flare up. Per protocol appt scheduled 12/10. Patient advised to call back with worsening symptoms. Patient verbalized understanding.   Copied from CRM [id number]. Topic: Clinical - Medical Advice >> Feb 26, 2023  4:33 PM Mosetta Putt H wrote: Reason for CRM: patient has been having trouble peeing Reason for Disposition  Urinating more frequently than usual (i.e., frequency)  Answer Assessment - Initial Assessment Questions 1. SYMPTOM: "What's the main symptom you're concerned about?" (e.g., frequency, incontinence)     Urinary pain 2. ONSET: "When did the  urinary pain  start?"     Saturday night 3. PAIN: "Is there any pain?" If Yes, ask: "How bad is it?" (Scale: 1-10; mild, moderate, severe)     moderate 4. CAUSE: "What do you think is causing the symptoms?"     Hx of cystitis  5. OTHER SYMPTOMS: "Do you have any other symptoms?" (e.g., blood in urine, fever, flank pain, pain with urination)     Urinary urgency, difficulty urinating, pain with urination,  Protocols used: Urinary Symptoms-A-AH

## 2023-02-26 NOTE — Telephone Encounter (Signed)
Patient thinks she has cystitis and she would like a call back to discuss getting a prescription for it.

## 2023-02-26 NOTE — Telephone Encounter (Signed)
Pt scheduled for visit 12/10

## 2023-02-27 ENCOUNTER — Ambulatory Visit: Payer: Medicare PPO | Admitting: Dermatology

## 2023-02-27 ENCOUNTER — Ambulatory Visit (HOSPITAL_BASED_OUTPATIENT_CLINIC_OR_DEPARTMENT_OTHER): Payer: Medicare PPO | Admitting: Family Medicine

## 2023-03-01 ENCOUNTER — Ambulatory Visit (HOSPITAL_BASED_OUTPATIENT_CLINIC_OR_DEPARTMENT_OTHER): Payer: Medicare PPO | Admitting: Family Medicine

## 2023-03-01 ENCOUNTER — Ambulatory Visit (HOSPITAL_BASED_OUTPATIENT_CLINIC_OR_DEPARTMENT_OTHER): Payer: Self-pay | Admitting: Family Medicine

## 2023-03-01 ENCOUNTER — Ambulatory Visit: Payer: Medicare PPO

## 2023-03-01 ENCOUNTER — Other Ambulatory Visit (HOSPITAL_BASED_OUTPATIENT_CLINIC_OR_DEPARTMENT_OTHER): Payer: Self-pay

## 2023-03-01 MED ORDER — PREDNISONE 10 MG PO TABS: 10.0000 mg | ORAL_TABLET | Freq: Every day | ORAL | 0 refills | Status: DC

## 2023-03-01 NOTE — Telephone Encounter (Signed)
When I spoke with Quanytta from [place], I told them that patient would need to come in for an appt so we can evaluate her. They said that pt is in so much pain and her husband cannot drive her here due to his health.  They were wanting to know if patient could do a telephone encounter as patient does not know how to work her mychart and I told them that we would either need to do an in person visit or a video visit.  Doris Cheadle said that patient wanted to see if anything might be able to be done without her coming in for a visit. Stated to them that I would send this over to [facility] for her to review and then we would get back with the pt.   Please advise.

## 2023-03-01 NOTE — Telephone Encounter (Signed)
Copied from CRM [id number]. Topic: Appointments - Appointment Cancel/Reschedule >> Mar 01, 2023  9:36 AM Tiffany H wrote: Patient called to advise that she needs to reschedule her appointment due to transportation issues. Patient advised that her leg is hurting and big and she doesn't have transportation to the appointment. She advised that she is in pain due to her leg and hasn't showered for days because of it. Transferred to Triage RN: PAIN.   Chief Complaint: left lower leg pain Symptoms: 8/10 pain and slight swelling Frequency: ongoing for 3 weeks  Pertinent Negatives: Patient denies fever, redness Disposition: [] ED /[] Urgent Care (no appt availability in office) / [x] Appointment(In office/virtual)/ []  [place] [facility]/ [] Home Care/ [] Refused Recommended Disposition /[] [place] [facility]/ []  Follow-up with PCP Additional Notes: Three weeks ago the patient started experiencing left lower leg pain that has worsened. 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  She reported 8/10 left lower leg pain and slight swelling.  Unable to walk even with cane. She had to cancel her in person appointment today due to pain and unable to drive and doesn't have anyone to take her.  She is requesting pain medicine as extra strength Tylenol has been unhelpful.  She is unable to do a virtual visit as she is unsure how to log into MyChart without assistance. She experienced similar pain 11/2021 she said, "he gave me two shock treatments and it healed up."  She had to cancel multiple appointments this week due to the pain.  Routed to pcp for follow up.    Reason for Disposition  [1] SEVERE pain (e.g., excruciating, unable to do any normal activities) AND [2] not improved after 2 hours of pain medicine  Answer Assessment - Initial Assessment Questions 1. ONSET: "When did the pain start?"      3 weeks ago  2. LOCATION: "Where is the pain located?"      Left lower leg 3.  PAIN: "How bad is the pain?"    (Scale 1-10; or mild, moderate, severe)   -  MILD (1-3): doesn't interfere with normal activities    -  MODERATE (4-7): interferes with normal activities (e.g., work or school) or awakens from sleep, limping    -  SEVERE (8-10): excruciating pain, unable to do any normal activities, unable to walk     Severe, unable to walk with cane  5. 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 OTHER SYMPTOMS: "Do you have any other symptoms?" (e.g., chest pain, back pain, breathing difficulty, swelling, rash, fever, numbness, weakness)     Swelling  Protocols used: Leg Pain-A-AH

## 2023-03-01 NOTE — Telephone Encounter (Signed)
Spoke with patient over the phone, for about 30 minutes. She is home alone, her daughter resides in [place], she is coming to visit her this Sunday until the New Year. Her husband is in assisted living and has been for quite some time and her transportation is limited due to her pain. She denies SOB, states leg is only painful when she has to put weight on it, has not been able to shower due to leg pain. She does have a shower chair, but it is in the basement she plans on getting assistance to get it out so she can utilize the chair. Spoke with provider in office, Alyson Reedy, agreeable to sending in short prednisone supply for patient. Patient states she knows someone on her street that may pick up her medication for her today. Advised patient to reach out to [place] on Monday if her pain still persist and we will be happy to see her in office or virtual since her daughter will be present to assist her. Offered patient a HH referral, she denies at this time does not think she is "that bad just yet". She will let us know if she changes her mind.

## 2023-03-03 ENCOUNTER — Emergency Department (HOSPITAL_COMMUNITY): Payer: Medicare PPO

## 2023-03-03 ENCOUNTER — Encounter (HOSPITAL_COMMUNITY): Payer: Self-pay | Admitting: Emergency Medicine

## 2023-03-03 ENCOUNTER — Emergency Department (HOSPITAL_COMMUNITY)
Admission: EM | Admit: 2023-03-03 | Discharge: 2023-03-03 | Disposition: A | Discharge status: Home / Self Care | Payer: Medicare PPO | Attending: Emergency Medicine | Admitting: Emergency Medicine

## 2023-03-03 ENCOUNTER — Other Ambulatory Visit: Payer: Self-pay

## 2023-03-03 DIAGNOSIS — I10 Essential (primary) hypertension: Secondary | ICD-10-CM | POA: Diagnosis not present

## 2023-03-03 DIAGNOSIS — M7122 Synovial cyst of popliteal space [Baker], left knee: Secondary | ICD-10-CM | POA: Diagnosis not present

## 2023-03-03 DIAGNOSIS — F32A Depression, unspecified: Secondary | ICD-10-CM | POA: Diagnosis not present

## 2023-03-03 DIAGNOSIS — R791 Abnormal coagulation profile: Secondary | ICD-10-CM | POA: Insufficient documentation

## 2023-03-03 DIAGNOSIS — M79605 Pain in left leg: Secondary | ICD-10-CM | POA: Diagnosis not present

## 2023-03-03 DIAGNOSIS — Z79899 Other long term (current) drug therapy: Secondary | ICD-10-CM | POA: Diagnosis not present

## 2023-03-03 DIAGNOSIS — R531 Weakness: Secondary | ICD-10-CM | POA: Diagnosis not present

## 2023-03-03 DIAGNOSIS — N390 Urinary tract infection, site not specified: Secondary | ICD-10-CM | POA: Insufficient documentation

## 2023-03-03 DIAGNOSIS — M79662 Pain in left lower leg: Secondary | ICD-10-CM | POA: Insufficient documentation

## 2023-03-03 DIAGNOSIS — J209 Acute bronchitis, unspecified: Secondary | ICD-10-CM | POA: Diagnosis not present

## 2023-03-03 DIAGNOSIS — I517 Cardiomegaly: Secondary | ICD-10-CM | POA: Diagnosis not present

## 2023-03-03 LAB — CBC WITH DIFFERENTIAL/PLATELET
Abs Immature Granulocytes: 0.02 10*3/uL (ref 0.00–0.07)
Basophils Absolute: 0 10*3/uL (ref 0.0–0.1)
Basophils Relative: 0 %
Eosinophils Absolute: 0.1 10*3/uL (ref 0.0–0.5)
Eosinophils Relative: 1 %
HCT: 28.1 % — ABNORMAL LOW (ref 36.0–46.0)
Hemoglobin: 9.7 g/dL — ABNORMAL LOW (ref 12.0–15.0)
Immature Granulocytes: 0 %
Lymphocytes Relative: 23 %
Lymphs Abs: 1.8 10*3/uL (ref 0.7–4.0)
MCH: 32.7 pg (ref 26.0–34.0)
MCHC: 34.5 g/dL (ref 30.0–36.0)
MCV: 94.6 fL (ref 80.0–100.0)
Monocytes Absolute: 0.8 10*3/uL (ref 0.1–1.0)
Monocytes Relative: 11 %
Neutro Abs: 4.9 10*3/uL (ref 1.7–7.7)
Neutrophils Relative %: 65 %
Platelets: 150 10*3/uL (ref 150–400)
RBC: 2.97 MIL/uL — ABNORMAL LOW (ref 3.87–5.11)
RDW: 12.1 % (ref 11.5–15.5)
WBC: 7.6 10*3/uL (ref 4.0–10.5)
nRBC: 0 % (ref 0.0–0.2)

## 2023-03-03 LAB — URINALYSIS, W/ REFLEX TO CULTURE (INFECTION SUSPECTED)
Bilirubin Urine: NEGATIVE
Glucose, UA: NEGATIVE mg/dL
Hgb urine dipstick: NEGATIVE
Ketones, ur: NEGATIVE mg/dL
Nitrite: NEGATIVE
Protein, ur: NEGATIVE mg/dL
Specific Gravity, Urine: 1.004 — ABNORMAL LOW (ref 1.005–1.030)
WBC, UA: >50 WBC/hpf (ref 0–5)
pH: 5 (ref 5.0–8.0)

## 2023-03-03 LAB — BASIC METABOLIC PANEL
Anion gap: 12 (ref 5–15)
BUN: 45 mg/dL — ABNORMAL HIGH (ref 8–23)
CO2: 20 mmol/L — ABNORMAL LOW (ref 22–32)
Calcium: 8.8 mg/dL — ABNORMAL LOW (ref 8.9–10.3)
Chloride: 100 mmol/L (ref 98–111)
Creatinine, Ser: 1.71 mg/dL — ABNORMAL HIGH (ref 0.44–1.00)
GFR, Estimated: 30 mL/min — ABNORMAL LOW (ref >60)
Glucose, Bld: 92 mg/dL (ref 70–99)
Potassium: 3.8 mmol/L (ref 3.5–5.1)
Sodium: 132 mmol/L — ABNORMAL LOW (ref 135–145)

## 2023-03-03 LAB — PROTIME-INR
INR: 2.4 — ABNORMAL HIGH (ref 0.8–1.2)
Prothrombin Time: 26.1 s — ABNORMAL HIGH (ref 11.4–15.2)

## 2023-03-03 LAB — TROPONIN I (HIGH SENSITIVITY): Troponin I (High Sensitivity): 6 ng/L (ref <18)

## 2023-03-03 MED ORDER — CEPHALEXIN 500 MG PO CAPS: 500.0000 mg | ORAL_CAPSULE | Freq: Three times a day (TID) | ORAL | 0 refills | Status: DC

## 2023-03-03 MED ORDER — ACETAMINOPHEN 500 MG PO TABS
1000.0000 mg | ORAL_TABLET | Freq: Once | ORAL | Status: AC
  Administered 2023-03-03: 1000 mg via ORAL
  Filled 2023-03-03: qty 2

## 2023-03-03 NOTE — ED Provider Notes (Signed)
[place] [place] EMERGENCY DEPARTMENT AT [facility] Provider Note   CSN: [id number] Arrival date & time: 03/03/23  3295     History  Chief Complaint  Patient presents with   Multiple Complaints    Andrea Simmons is a 82 y.o. female.  She is brought in from home by ambulance for various complaints.  It sounds like she lives with her husband but currently he is in the hospital at the [facility].  She said she is having a pain in her left calf that started about a year ago.  She is concerned it might be a blood clot.  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  She also is afraid she might have a bladder infection because she had some difficulty urinating last week and now is urinating more frequently.  She also says her eyes are dry, she saw her eye doctor a week ago and he told her everything was fine.  She denies any headache chest pain shortness of breath abdominal pain vomiting diarrhea.  No recent falls.  She said she has been eating and drinking okay.  She does endorse some chronic pain in her back.  The history is provided by the patient and the EMS personnel.  Urinary Frequency The current episode started more than 1 week ago. The problem occurs daily. The problem has not changed since onset.Pertinent negatives include no chest pain, no abdominal pain, no headaches and no shortness of breath. Nothing aggravates the symptoms. Nothing relieves the symptoms. She has tried nothing for the symptoms. The treatment provided no relief.  Leg Pain Location:  Leg Leg location:  L lower leg Pain details:    Quality:  Aching   Onset quality:  Unable to specify Associated symptoms: back pain   Associated symptoms: no fever        Home Medications Prior to Admission medications   Medication Sig Start Date End Date Taking? Authorizing Provider  ACCU-CHEK GUIDE test strip CHECK BLOOD SUGARS DAILY 12/19/21   Sheliah Hatch, MD  Accu-Chek Softclix Lancets lancets USE AS  INSTRUCTED TO TEST SUGARS TWICE A DAY 12/13/22   Sheliah Hatch, MD  amLODipine (NORVASC) 10 MG tablet TAKE 1 TABLET BY MOUTH EVERY DAY 07/14/22   [name], Anabel Bene, MD  Biotin 1000 MCG tablet Take 1,000 mcg by mouth daily.    [provider]  Blood Glucose Monitoring Suppl (ACCU-CHEK GUIDE) w/Device KIT 1 each by Does not apply route 2 (two) times daily. To test sugars. Dx. E11.9 06/23/19   Sheliah Hatch, MD  DULoxetine (CYMBALTA) 30 MG capsule Take 3 capsules (90 mg total) by mouth daily. 01/17/23   [name], Steva Ready., MD  eszopiclone (LUNESTA) 2 MG TABS tablet Take 1 tablet (2 mg total) by mouth at bedtime as needed for sleep. Take immediately before bedtime 01/17/23   [name], Steva Ready., MD  fenofibrate (TRICOR) 48 MG tablet Take 48 mg by mouth daily. 01/06/21   [provider]  ferrous sulfate 325 (65 FE) MG tablet TAKE 1 TABLET BY MOUTH EVERY DAY 04/26/22   Sheliah Hatch, MD  FLAREX 0.1 % ophthalmic suspension Apply to eye. 02/09/21   [provider]  furosemide (LASIX) 20 MG tablet TAKE 1 TABLET BY MOUTH EVERY DAY AS NEEDED 11/24/22   [name], Anabel Bene, MD  gabapentin (NEURONTIN) 100 MG capsule Take 1 capsule (100 mg total) by mouth at bedtime. Patient not taking: Reported on 02/08/2023 01/17/23  [name], Steva Ready., MD  hydrALAZINE (APRESOLINE) 100 MG tablet TAKE 1 TABLET BY MOUTH TWICE A DAY 07/20/22   [name], [name], MD  labetalol (NORMODYNE) 200 MG tablet TAKE 1 TABLET BY MOUTH TWICE A DAY 06/21/22   [name], [name], MD  levothyroxine (SYNTHROID) 25 MCG tablet TAKE 1 TABLET BY MOUTH EVERY DAY BEFORE BREAKFAST 08/31/22   Shade Flood, MD  linagliptin (TRADJENTA) 5 MG TABS tablet Take 5 mg by mouth daily.    [provider]  loteprednol (LOTEMAX) 0.5 % ophthalmic suspension SMARTSIG:In Eye(s) 04/21/22   [provider]  Magnesium 500 MG TABS Take 500 mg by mouth daily.    [provider]  predniSONE  (DELTASONE) 10 MG tablet Take 1 tablet (10 mg total) by mouth daily with breakfast. 03/01/23   Alyson Reedy, FNP  rosuvastatin (CRESTOR) 20 MG tablet Take 20 mg by mouth daily.    [provider]  valsartan (DIOVAN) 320 MG tablet TAKE 1 TABLET BY MOUTH EVERY DAY 08/31/22   [name], [name], MD  warfarin (COUMADIN) 5 MG tablet TAKE 1/2 TABLET (2.5 MG) BY MOUTH DAILY EXCEPT TAKE 1 TABLET (5 MG) ON MONDAYS, WEDNESDAYS AND SATURDAYS OR AS DIRECTED BY ANTICOAGULATION CLINIC 05/31/22   Sheliah Hatch, MD      Allergies    Lipitor [atorvastatin], Morphine, Meperidine, and Naproxen sodium    Review of Systems   Review of Systems  Constitutional:  Negative for fever.  Eyes:  Positive for itching.  Respiratory:  Negative for shortness of breath.   Cardiovascular:  Negative for chest pain.  Gastrointestinal:  Negative for abdominal pain, diarrhea and vomiting.  Genitourinary:  Positive for frequency.  Musculoskeletal:  Positive for back pain.  Neurological:  Negative for headaches.    Physical Exam Updated Vital Signs BP (!) 145/81 (BP Location: Right Arm)   Pulse (!) 55   Temp 97.8 F (36.6 C) (Oral)   Resp 16   Ht 5\' 2"  (1.575 m)   Wt 61.2 kg   SpO2 99%   BMI 24.69 kg/m  Physical Exam Vitals and nursing note reviewed.  Constitutional:      General: She is not in acute distress.    Appearance: Normal appearance. She is well-developed.  HENT:     Head: Normocephalic and atraumatic.  Eyes:     Conjunctiva/sclera: Conjunctivae normal.  Cardiovascular:     Rate and Rhythm: Normal rate and regular rhythm.     Heart sounds: No murmur heard. Pulmonary:     Effort: Pulmonary effort is normal. No respiratory distress.     Breath sounds: Normal breath sounds.  Abdominal:     Palpations: Abdomen is soft.     Tenderness: There is no abdominal tenderness. There is no guarding or rebound.  Musculoskeletal:        General: Tenderness (She has some tenderness of her  proximal lateral left lower leg.  No overlying erythema no mass.  No cords appreciated no swelling) present. No deformity. Normal range of motion.     Cervical back: Neck supple.  Skin:    General: Skin is warm and dry.     Capillary Refill: Capillary refill takes less than 2 seconds.  Neurological:     General: No focal deficit present.     Mental Status: She is alert.     Sensory: No sensory deficit.     Motor: No weakness.     ED Results / Procedures / Treatments   Labs (  all labs ordered are listed, but only abnormal results are displayed) Labs Reviewed  BASIC METABOLIC PANEL - Abnormal; Notable for the following components:      Result Value   Sodium 132 (*)    CO2 20 (*)    BUN 45 (*)    Creatinine, Ser 1.71 (*)    Calcium 8.8 (*)    GFR, Estimated 30 (*)    All other components within normal limits  CBC WITH DIFFERENTIAL/PLATELET - Abnormal; Notable for the following components:   RBC 2.97 (*)    Hemoglobin 9.7 (*)    HCT 28.1 (*)    All other components within normal limits  URINALYSIS, W/ REFLEX TO CULTURE (INFECTION SUSPECTED) - Abnormal; Notable for the following components:   APPearance HAZY (*)    Specific Gravity, Urine 1.004 (*)    Leukocytes,Ua LARGE (*)    Bacteria, UA RARE (*)    All other components within normal limits  PROTIME-INR - Abnormal; Notable for the following components:   Prothrombin Time 26.1 (*)    INR 2.4 (*)    All other components within normal limits  URINE CULTURE  TROPONIN I (HIGH SENSITIVITY)    EKG EKG Interpretation Date/Time:  Saturday March 03 2023 07:51:13 EST Ventricular Rate:  56 PR Interval:  179 QRS Duration:  179 QT Interval:  503 QTC Calculation: 486 R Axis:   -64  Text Interpretation: Sinus rhythm RBBB and LAFB Probable left ventricular hypertrophy No significant change since prior 9/24 Confirmed by Meridee Score [phone]) on 03/03/2023 7:56:33 AM  Radiology US Venous Img Lower  Left (DVT Study) Result  Date: 03/03/2023 CLINICAL DATA:  82 year old female with weakness and pain. EXAM: LEFT LOWER EXTREMITY VENOUS DOPPLER ULTRASOUND TECHNIQUE: Gray-scale sonography with compression, as well as color and duplex ultrasound, were performed to evaluate the deep venous system(s) from the level of the common femoral vein through the popliteal and proximal calf veins. COMPARISON:  Left hip CT 01/27/2022. FINDINGS: VENOUS Normal compressibility of the common femoral, superficial femoral, and popliteal veins, as well as the visualized calf veins. Visualized portions of profunda femoral vein and great saphenous vein unremarkable. No filling defects to suggest DVT on grayscale or color Doppler imaging. Doppler waveforms show normal direction of venous flow, normal respiratory plasticity and response to augmentation. Limited views of the contralateral common femoral vein are unremarkable. OTHER An elongated nonvascular fluid collection is suspected in the popliteal fossa on images 27 through 29. Limitations: none IMPRESSION: 1. Negative for left lower extremity DVT. 2. Probable left popliteal fossa Baker's cyst. Electronically Signed   By: Odessa Fleming M.D.   On: 03/03/2023 11:15   DG Chest Port 1 View Result Date: 03/03/2023 CLINICAL DATA:  82 year old female with history of weakness. EXAM: PORTABLE CHEST 1 VIEW COMPARISON:  Chest x-ray 10/08/2022. FINDINGS: Lung volumes are low. No consolidative airspace disease. No pleural effusions. Mild diffuse interstitial prominence and widespread peribronchial cuffing. No pneumothorax. No evidence of pulmonary edema. Heart size appears mildly enlarged. The patient is rotated to the right on today's exam, resulting in distortion of the mediastinal contours and reduced diagnostic sensitivity and specificity for mediastinal pathology. Surgical clips project over the left breast and left axillary region. IMPRESSION: 1. Diffuse interstitial prominence and peribronchial cuffing, concerning for  potential acute bronchitis. 2. Mild cardiomegaly. Electronically Signed   By: Trudie Reed M.D.   On: 03/03/2023 07:56    Procedures Procedures    Medications Ordered in ED Medications  acetaminophen (TYLENOL) tablet  1,000 mg (1,000 mg Oral Given 03/03/23 1312)    ED Course/ Medical Decision Making/ A&P Clinical Course as of 03/03/23 1630  Sat Mar 03, 2023  0804 Chest x-ray showing some interstitial and peribronchial cuffing.  Awaiting radiology reading. [MB]    Clinical Course User Index [MB] Terrilee Files, MD                                 Medical Decision Making Amount and/or Complexity of Data Reviewed Labs: ordered. Radiology: ordered.  Risk OTC drugs. Prescription drug management.   This patient complains of left leg pain urinary frequency dry eyes; this involves an extensive number of treatment Options and is a complaint that carries with it a high risk of complications and morbidity. The differential includes contusion, hematoma, Baker's cyst, DVT, UTI  I ordered, reviewed and interpreted labs, which included CBC with normal white count stable low hemoglobin, chemistries with chronic CKD low bicarb, urinalysis possible signs of infection sent for culture, troponins flat, INR therapeutic I ordered medication oral Tylenol and reviewed PMP when indicated. I ordered imaging studies which included chest x-ray and duplex left lower extremity and I independently    visualized and interpreted imaging which showed no definite infiltrate no evidence of DVT.  Does have Baker's cyst Previous records obtained and reviewed In epic including recent cardiology and PCP notes Cardiac monitoring reviewed, sinus rhythm Social determinants considered, depression Critical Interventions: None  After the interventions stated above, I reevaluated the patient and found patient to be ambulatory in the department with a walker Admission and further testing considered, no  indications for admission or further workup at this time.  One of her neighbors is here to bring her back home.  Recommend close follow-up with PCP.         Final Clinical Impression(s) / ED Diagnoses Final diagnoses:  Pain in left lower leg  Urinary tract infection without hematuria, site unspecified  Baker's cyst of knee, left    Rx / DC Orders ED Discharge Orders          Ordered    cephALEXin (KEFLEX) 500 MG capsule  3 times daily        03/03/23 1113              Terrilee Files, MD 03/03/23 [phone]

## 2023-03-03 NOTE — ED Triage Notes (Signed)
Pt with c/o L pain from previous injury and thinks she "may have a blood clot", depression (lives alone and husband is in SNF), and "bladder issues".

## 2023-03-03 NOTE — Discharge Instructions (Signed)
You were seen in the emergency department for pain of your left leg and possible urinary symptoms.  Your urine looks like it had signs of infection and we are starting you on an antibiotic.  You had an ultrasound of your left lower leg that did not show any evidence of blood clot.  They did see a possible Baker's cyst which could explain your pain.  You can use ice to the affected area and follow-up with your primary care doctor and orthopedics.

## 2023-03-03 NOTE — ED Notes (Signed)
Pt ambulated to restroom with walker

## 2023-03-03 NOTE — ED Notes (Signed)
[facility] [phone] OR [phone]- neighbor will pick up patient upon d/c

## 2023-03-04 ENCOUNTER — Encounter: Payer: Self-pay | Admitting: Sports Medicine

## 2023-03-05 ENCOUNTER — Telehealth: Payer: Self-pay | Admitting: Sports Medicine

## 2023-03-05 LAB — URINE CULTURE: Culture: >=100000 — AB

## 2023-03-05 NOTE — Telephone Encounter (Signed)
Patient called and wants to know if you could get her in asap. She went to the ER in [place] and hurt her left leg. CB#[phone]

## 2023-03-06 ENCOUNTER — Ambulatory Visit (HOSPITAL_BASED_OUTPATIENT_CLINIC_OR_DEPARTMENT_OTHER): Payer: Medicare PPO | Admitting: Family Medicine

## 2023-03-06 ENCOUNTER — Encounter (HOSPITAL_BASED_OUTPATIENT_CLINIC_OR_DEPARTMENT_OTHER): Payer: Self-pay | Admitting: Student

## 2023-03-06 ENCOUNTER — Ambulatory Visit (INDEPENDENT_AMBULATORY_CARE_PROVIDER_SITE_OTHER): Payer: Medicare PPO | Admitting: Student

## 2023-03-06 ENCOUNTER — Telehealth (HOSPITAL_BASED_OUTPATIENT_CLINIC_OR_DEPARTMENT_OTHER): Payer: Self-pay | Admitting: *Deleted

## 2023-03-06 ENCOUNTER — Encounter (HOSPITAL_BASED_OUTPATIENT_CLINIC_OR_DEPARTMENT_OTHER): Payer: Self-pay | Admitting: Family Medicine

## 2023-03-06 ENCOUNTER — Ambulatory Visit (HOSPITAL_BASED_OUTPATIENT_CLINIC_OR_DEPARTMENT_OTHER): Payer: Medicare PPO

## 2023-03-06 VITALS — BP 102/57 | HR 53 | Ht 62.0 in | Wt 136.0 lb

## 2023-03-06 DIAGNOSIS — E782 Mixed hyperlipidemia: Secondary | ICD-10-CM | POA: Diagnosis not present

## 2023-03-06 DIAGNOSIS — M5442 Lumbago with sciatica, left side: Secondary | ICD-10-CM | POA: Diagnosis not present

## 2023-03-06 DIAGNOSIS — E039 Hypothyroidism, unspecified: Secondary | ICD-10-CM

## 2023-03-06 DIAGNOSIS — M47816 Spondylosis without myelopathy or radiculopathy, lumbar region: Secondary | ICD-10-CM | POA: Diagnosis not present

## 2023-03-06 DIAGNOSIS — G8929 Other chronic pain: Secondary | ICD-10-CM | POA: Diagnosis not present

## 2023-03-06 DIAGNOSIS — D631 Anemia in chronic kidney disease: Secondary | ICD-10-CM

## 2023-03-06 DIAGNOSIS — R269 Unspecified abnormalities of gait and mobility: Secondary | ICD-10-CM | POA: Diagnosis not present

## 2023-03-06 DIAGNOSIS — R350 Frequency of micturition: Secondary | ICD-10-CM | POA: Diagnosis not present

## 2023-03-06 DIAGNOSIS — Z7901 Long term (current) use of anticoagulants: Secondary | ICD-10-CM | POA: Diagnosis not present

## 2023-03-06 DIAGNOSIS — I10 Essential (primary) hypertension: Secondary | ICD-10-CM

## 2023-03-06 DIAGNOSIS — E119 Type 2 diabetes mellitus without complications: Secondary | ICD-10-CM | POA: Diagnosis not present

## 2023-03-06 DIAGNOSIS — M3501 Sicca syndrome with keratoconjunctivitis: Secondary | ICD-10-CM

## 2023-03-06 DIAGNOSIS — R413 Other amnesia: Secondary | ICD-10-CM

## 2023-03-06 DIAGNOSIS — Z7409 Other reduced mobility: Secondary | ICD-10-CM

## 2023-03-06 DIAGNOSIS — N184 Chronic kidney disease, stage 4 (severe): Secondary | ICD-10-CM

## 2023-03-06 DIAGNOSIS — F339 Major depressive disorder, recurrent, unspecified: Secondary | ICD-10-CM | POA: Diagnosis not present

## 2023-03-06 DIAGNOSIS — M4316 Spondylolisthesis, lumbar region: Secondary | ICD-10-CM | POA: Diagnosis not present

## 2023-03-06 DIAGNOSIS — C50912 Malignant neoplasm of unspecified site of left female breast: Secondary | ICD-10-CM

## 2023-03-06 DIAGNOSIS — I2699 Other pulmonary embolism without acute cor pulmonale: Secondary | ICD-10-CM

## 2023-03-06 DIAGNOSIS — M549 Dorsalgia, unspecified: Secondary | ICD-10-CM | POA: Diagnosis not present

## 2023-03-06 DIAGNOSIS — N1832 Chronic kidney disease, stage 3b: Secondary | ICD-10-CM

## 2023-03-06 DIAGNOSIS — M5126 Other intervertebral disc displacement, lumbar region: Secondary | ICD-10-CM | POA: Diagnosis not present

## 2023-03-06 LAB — POCT URINALYSIS DIP (CLINITEK)
Bilirubin, UA: NEGATIVE
Blood, UA: NEGATIVE
Glucose, UA: NEGATIVE mg/dL
Ketones, POC UA: NEGATIVE mg/dL
Nitrite, UA: NEGATIVE
POC PROTEIN,UA: NEGATIVE
Spec Grav, UA: 1.02 (ref 1.010–1.025)
Urobilinogen, UA: 0.2 U/dL
pH, UA: 6 (ref 5.0–8.0)

## 2023-03-06 MED ORDER — FERROUS SULFATE 325 (65 FE) MG PO TABS: 325.0000 mg | ORAL_TABLET | Freq: Every day | ORAL | 1 refills | Status: DC

## 2023-03-06 MED ORDER — HYDRALAZINE HCL 100 MG PO TABS: 50.0000 mg | ORAL_TABLET | Freq: Two times a day (BID) | ORAL | 0 refills | Status: DC

## 2023-03-06 MED ORDER — WARFARIN SODIUM 5 MG PO TABS: ORAL_TABLET | ORAL | 2 refills | Status: DC

## 2023-03-06 MED ORDER — LINAGLIPTIN 5 MG PO TABS: 5.0000 mg | ORAL_TABLET | Freq: Every day | ORAL | 1 refills | Status: DC

## 2023-03-06 MED ORDER — SULFACETAMIDE-PREDNISOLONE 10-0.2 % OP OINT: 1.0000 | TOPICAL_OINTMENT | Freq: Four times a day (QID) | OPHTHALMIC | 0 refills | Status: DC

## 2023-03-06 NOTE — Progress Notes (Signed)
Chief Complaint: Left knee and leg pain     History of Present Illness:    Andrea Simmons is a 82 y.o. female presenting today for follow-up from the ED concerning pain of the left knee and lower leg.  This has been going on since she sustained a fall in 2023.  Since then, she was treated by Dr. Shon Baton for a left calf strain including shockwave therapy and was also seen by Dr. Alvester Morin for an L4 transforaminal ESI on 05/02/2022.  Reports that this did get her some relief for at least a few months.  She did have a Doppler ultrasound completed in the ED which rule out presence of DVT however did suggest possible small Baker's cyst.  Denies any significant pain or tightness in the posterior knee.  States that most of her pain is around the lateral and anterior lower leg which she rates at an 8/10.  Pain does travel up the thigh and into the left buttock.  Has been taking Tylenol but denies any other treatments.   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) 05/15/2022   Anxiety    Breast cancer (HCC)    Chronic osteoarthritis    CKD stage 3b, GFR 30-44 ml/min (HCC) 01/25/2021   Depression    Diabetes mellitus type II    Diabetes type 2, controlled (HCC) 08/27/2008   Qualifier: Diagnosis of   By: Jonny Ruiz MD, Len Blalock        Essential hypertension 03/20/2007   Qualifier: Diagnosis of   By: Yetta Barre RN, CGRN, Sheri         Exertional dyspnea 11/09/2008   11/20/2017  Walked RA x 3 laps @ 185 ft each stopped due to  End of study, fast pace, no desat, min sob   - Echo 12/05/2017 - LVEF 60-65%, moderate LVH, normal wall motion, grade 1 DD,   indeterminate LV filling pressure, normal GLS at -21%, normal LA   size, trivial TR, RVSP 20 mmHg, normal IVC.           Fibromyalgia    GERD (gastroesophageal reflux disease)    Hyperlipidemia     Hypertension    Hypothyroidism    Malignant neoplasm of breast (female), unspecified site 1993   L breast s/p mastectomy and tamoxifen x 59yrs   Osteoporosis    Other pulmonary embolism and infarction 2008 and 2009   chronic anticoag - LeB CC   Past Surgical History:  Procedure Laterality Date   APPENDECTOMY     BALLOON DILATION N/A 10/18/2020   Procedure: BALLOON DILATION;  Surgeon: Lanelle Bal, DO;  Location: [facility];  Service: Endoscopy;  Laterality: N/A;   BIOPSY  10/18/2020   Procedure: BIOPSY;  Surgeon: Lanelle Bal, DO;  Location: [facility];  Service: Endoscopy;;  gastric    BREAST BIOPSY Left 1993   BREAST BIOPSY Right 09/25/2014   stero. Benign   BREAST RECONSTRUCTION Left    CATARACT EXTRACTION     ESOPHAGOGASTRODUODENOSCOPY (EGD) WITH PROPOFOL N/A 10/18/2020   Procedure: ESOPHAGOGASTRODUODENOSCOPY (EGD) WITH PROPOFOL;  Surgeon: Lanelle Bal, DO;  Location: [facility];  Service: Endoscopy;  Laterality: N/A;  11:00am  MASTECTOMY Left    ROTATOR CUFF REPAIR     SPINAL FUSION      x 2   TOTAL ABDOMINAL HYSTERECTOMY W/ BILATERAL SALPINGOOPHORECTOMY     TUBAL LIGATION     Social History   Socioeconomic History   Marital status: Married    Spouse name: Not on file   Number of children: 1   Years of education: Not on file   Highest education level: Not on file  Occupational History   Occupation: Software engineer: RETIRED   Occupation: Producer, television/film/video   Occupation: school bus driver  Tobacco Use   Smoking status: Never   Smokeless tobacco: Never  Vaping Use   Vaping status: Never Used  Substance and Sexual Activity   Alcohol use: No    Alcohol/week: 0.0 standard drinks of alcohol   Drug use: No   Sexual activity: Not Currently  Other Topics Concern   Not on file  Social History Narrative   Not on file   Social Drivers of Health   Financial Resource Strain: Low Risk  (11/16/2021)   Overall Financial Resource Strain  (CARDIA)    Difficulty of Paying Living Expenses: Not hard at all  Food Insecurity: No Food Insecurity (02/20/2022)   Hunger Vital Sign    Worried About Running Out of Food in the Last Year: Never true    Ran Out of Food in the Last Year: Never true  Transportation Needs: No Transportation Needs (02/20/2022)   PRAPARE - Administrator, Civil Service (Medical): No    Lack of Transportation (Non-Medical): No  Physical Activity: Insufficiently Active (11/16/2021)   Exercise Vital Sign    Days of Exercise per Week: 3 days    Minutes of Exercise per Session: 30 min  Stress: No Stress Concern Present (11/16/2021)   [employer] of Occupational Health - Occupational Stress Questionnaire    Feeling of Stress : Not at all  Social Connections: Moderately Isolated (11/16/2021)   Social Connection and Isolation Panel [NHANES]    Frequency of Communication with Friends and Family: More than three times a week    Frequency of Social Gatherings with Friends and Family: More than three times a week    Attends Religious Services: Never    Database administrator or Organizations: No    Attends Engineer, structural: Never    Marital Status: Married   Family History  Problem Relation Age of Onset   Depression Other        Parent   Arthritis Other        Parent, Grandparent   Hypertension Other        Grandparent   Hyperlipidemia Other        FMH   Miscarriages / Stillbirths Other        Grandparent   Stroke Other        FMH   Cancer Maternal Uncle        prostate   Hypertension Maternal Grandfather    Breast cancer Cousin    Breast cancer Cousin    Allergies  Allergen Reactions   Lipitor [Atorvastatin]     Unknown   Morphine Nausea And Vomiting   Meperidine Nausea And Vomiting   Naproxen Sodium Nausea And Vomiting   Current Outpatient Medications  Medication Sig Dispense Refill   ACCU-CHEK GUIDE test strip CHECK BLOOD SUGARS DAILY 100 strip 12   Accu-Chek  Softclix Lancets lancets USE AS INSTRUCTED TO TEST  SUGARS TWICE A DAY 100 each 12   amLODipine (NORVASC) 10 MG tablet TAKE 1 TABLET BY MOUTH EVERY DAY 90 tablet 2   Biotin 1000 MCG tablet Take 1,000 mcg by mouth daily.     Blood Glucose Monitoring Suppl (ACCU-CHEK GUIDE) w/Device KIT 1 each by Does not apply route 2 (two) times daily. To test sugars. Dx. E11.9 1 kit 1   DULoxetine (CYMBALTA) 30 MG capsule Take 3 capsules (90 mg total) by mouth daily. 270 capsule 3   eszopiclone (LUNESTA) 2 MG TABS tablet Take 1 tablet (2 mg total) by mouth at bedtime as needed for sleep. Take immediately before bedtime 30 tablet 5   fenofibrate (TRICOR) 48 MG tablet Take 48 mg by mouth daily.     ferrous sulfate 325 (65 FE) MG tablet Take 1 tablet (325 mg total) by mouth daily. 90 tablet 1   FLAREX 0.1 % ophthalmic suspension Apply to eye.     furosemide (LASIX) 20 MG tablet TAKE 1 TABLET BY MOUTH EVERY DAY AS NEEDED 90 tablet 1   gabapentin (NEURONTIN) 100 MG capsule Take 1 capsule (100 mg total) by mouth at bedtime. 90 capsule 0   hydrALAZINE (APRESOLINE) 100 MG tablet Take 0.5 tablets (50 mg total) by mouth 2 (two) times daily. 90 tablet 0   labetalol (NORMODYNE) 200 MG tablet TAKE 1 TABLET BY MOUTH TWICE A DAY 180 tablet 2   levothyroxine (SYNTHROID) 25 MCG tablet TAKE 1 TABLET BY MOUTH EVERY DAY BEFORE BREAKFAST 90 tablet 1   linagliptin (TRADJENTA) 5 MG TABS tablet Take 1 tablet (5 mg total) by mouth daily. 90 tablet 1   loteprednol (LOTEMAX) 0.5 % ophthalmic suspension SMARTSIG:In Eye(s)     Magnesium 500 MG TABS Take 500 mg by mouth daily.     rosuvastatin (CRESTOR) 20 MG tablet Take 20 mg by mouth daily.     sulfacetaminde-prednisoLONE (BLEPHAMIDE) ophthalmic ointment Place 1 Application into both eyes 4 (four) times daily. 3.5 g 0   valsartan (DIOVAN) 320 MG tablet TAKE 1 TABLET BY MOUTH EVERY DAY 90 tablet 2   warfarin (COUMADIN) 5 MG tablet TAKE 1/2 TABLET (2.5 MG) BY MOUTH DAILY EXCEPT TAKE 1 TABLET  (5 MG) ON MONDAYS, WEDNESDAYS AND SATURDAYS OR AS DIRECTED BY ANTICOAGULATION CLINIC 75 tablet 2   No current facility-administered medications for this visit.   No results found.  Review of Systems:   A ROS was performed including pertinent positives and negatives as documented in the HPI.  Physical Exam :   Constitutional: NAD and appears stated age Neurological: Alert and oriented Psych: Appropriate affect and cooperative There were no vitals taken for this visit.   Comprehensive Musculoskeletal Exam:    Tenderness of the left side of the lumbar spine and throughout the lumbar region toward the buttock.  Pain increased with internal and external rotation of the left hip.  Tenderness over the lateral aspect of the mid and distal calf.  Ankle strength with dorsiflexion and plantarflexion is 5/5.  No significant tenderness about the left knee.  Imaging:   Xray (lumbar spine 4 views): Anterolisthesis of L4 in relation to L3 and L5.  Mild diffuse degenerative changes throughout the lumbar spine without evidence of acute abnormality.   I personally reviewed and interpreted the radiographs.   Assessment:   82 y.o. female with chronic pain in her left lower leg.  Recent ultrasound did rule out presence of DVT but given her symptoms and history of believe this is more  consistent with lumbar radiculopathy as her symptoms travel along an L4-L5 dermatomal pattern.  Discussed that Baker's cyst shown on ultrasound is self-limiting and this does not seem to be causing her symptoms at this time.  I have recommended for her to follow back up with Dr. Alvester Morin to see if she would be a candidate for a repeat ESI.  In the meantime I have also recommended use of topical pain relievers as she has CKD.  Plan :    -Follow-up with Dr. Alvester Morin for possible repeat of ESI     I personally saw and evaluated the patient, and participated in the management and treatment plan.  Hazle Nordmann,  PA-C Orthopedics

## 2023-03-06 NOTE — Progress Notes (Unsigned)
   Established Patient Office Visit  Subjective  Patient ID: ALEXZA KINA, female    DOB: April 24, 1940  Age: 82 y.o. MRN: [id number]  Chief Complaint  Patient presents with   Urinary Tract Infection    States symptoms have gotten better, not finished with antibiotic   Stye    Left eye, noticed yesterday morning   Anemia    Is taking OTC iron as well, 65   Kentavia Hogenson is a pleasant 82 year old female patient who presents for a hospital follow-up. She was recently seen in the ED 12/14 for multiple complaints- left calf pain with concerns for blood clot and possible UTI. Also has complaints about dry eye. She was not admitted. She was treated for UTI with Keflex. Patient presents to the visit with her daughter.   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 despite use of Lotemax from ED L eyelid with stye present   HTN: currently taking all listed medications She does keep a detailed log of her home BP readings,  but did not bring with her today Reports this morning her BP was: 172/82 but currently low Denies lightheadedness, dizziness,  Labetalol 200mg - twice daily  Amlodipine 10mg - takes at bed  Hydralazine 100mg - twice daily  Valsartan 320mg  daily- taking 1/2 tab   Lasix 20mg  "supposed to take PRN but I take most of the time"   Memory loss- feels like she is having a difficult time remembering things As we review her medications, she is reporting different duloxetine 90mg  & Lunesta 2 mg HS  Fibromyalgia   A1c 5.7  [place]   *INR 2-3  ROS    Objective:    BP (!) 102/57   Pulse (!) 53   Ht 5\' 2"  (1.575 m)   Wt 136 lb (61.7 kg)   SpO2 98%   BMI 24.87 kg/m  BP Readings from Last 3 Encounters:  03/06/23 (!) 102/57  03/03/23 124/80  02/08/23 125/63    Physical Exam Vitals reviewed.  Constitutional:      Appearance:  Normal appearance.  Cardiovascular:     Rate and Rhythm: Normal rate and regular rhythm.     Heart sounds: Normal heart sounds.  Pulmonary:     Effort: Pulmonary effort is normal.     Breath sounds: Normal breath sounds.  Musculoskeletal:       Feet:  Feet:     Right foot:     Skin integrity: Callus (wart on anterior portion of R third toe) present. No erythema or warmth.  Neurological:     Mental Status: She is alert.  Psychiatric:        Mood and Affect: Mood normal.        Behavior: Behavior normal.     Assessment & Plan:   Urinary frequency- POCT UA unremarkable. Patient recently treated for UTI. Feel leukocytes are possible contamination.   Gait & mobility impaired- patient would like referral for home health   Impaired memory-   Return in about 4 weeks (around 04/03/2023) for change in BP medications .    Alyson Reedy, FNP

## 2023-03-06 NOTE — Telephone Encounter (Signed)
Post ED Visit - Positive Culture Follow-up  Culture report reviewed by antimicrobial stewardship pharmacist: [facility] [x]  Daylene Posey, Pharm.D. []  Celedonio Miyamoto, Pharm.D., BCPS AQ-ID []  Garvin Fila, Pharm.D., BCPS []  Georgina Pillion, Pharm.D., BCPS []  [place], [address].D., BCPS, AAHIVP []  Estella Husk, Pharm.D., BCPS, AAHIVP []  Lysle Pearl, PharmD, BCPS []  Phillips Climes, PharmD, BCPS []  Agapito Games, PharmD, BCPS []  Verlan Friends, PharmD []  Mervyn Gay, PharmD, BCPS []  Vinnie Level, PharmD  [facility] []  Len Childs, PharmD []  Greer Pickerel, PharmD []  Adalberto Cole, PharmD []  Perlie Gold, Rph []  Lonell Face) Jean Rosenthal, PharmD []  Earl Many, PharmD []  Junita Push, PharmD []  Dorna Leitz, PharmD []  Terrilee Files, PharmD []  Lynann Beaver, PharmD []  Keturah Barre, PharmD []  Loralee Pacas, PharmD []  Bernadene Person, PharmD   Positive urine culture Treated with Cephalexin, organism sensitive to the same and no further patient follow-up is required at this time.  Virl A[facility] 03/06/2023, 7:49 AM

## 2023-03-07 ENCOUNTER — Encounter (HOSPITAL_BASED_OUTPATIENT_CLINIC_OR_DEPARTMENT_OTHER): Payer: Self-pay | Admitting: Family Medicine

## 2023-03-07 ENCOUNTER — Encounter (HOSPITAL_BASED_OUTPATIENT_CLINIC_OR_DEPARTMENT_OTHER): Payer: Self-pay

## 2023-03-07 ENCOUNTER — Telehealth (HOSPITAL_BASED_OUTPATIENT_CLINIC_OR_DEPARTMENT_OTHER): Payer: Self-pay | Admitting: Family Medicine

## 2023-03-07 ENCOUNTER — Telehealth (INDEPENDENT_AMBULATORY_CARE_PROVIDER_SITE_OTHER): Payer: Medicare PPO | Admitting: Family Medicine

## 2023-03-07 ENCOUNTER — Other Ambulatory Visit (HOSPITAL_BASED_OUTPATIENT_CLINIC_OR_DEPARTMENT_OTHER): Payer: Self-pay

## 2023-03-07 DIAGNOSIS — F331 Major depressive disorder, recurrent, moderate: Secondary | ICD-10-CM

## 2023-03-07 DIAGNOSIS — E782 Mixed hyperlipidemia: Secondary | ICD-10-CM

## 2023-03-07 DIAGNOSIS — M3501 Sicca syndrome with keratoconjunctivitis: Secondary | ICD-10-CM | POA: Diagnosis not present

## 2023-03-07 DIAGNOSIS — I2699 Other pulmonary embolism without acute cor pulmonale: Secondary | ICD-10-CM

## 2023-03-07 DIAGNOSIS — N184 Chronic kidney disease, stage 4 (severe): Secondary | ICD-10-CM | POA: Diagnosis not present

## 2023-03-07 DIAGNOSIS — E1122 Type 2 diabetes mellitus with diabetic chronic kidney disease: Secondary | ICD-10-CM

## 2023-03-07 DIAGNOSIS — Z79899 Other long term (current) drug therapy: Secondary | ICD-10-CM

## 2023-03-07 LAB — IRON,TIBC AND FERRITIN PANEL
Ferritin: 228 ng/mL — ABNORMAL HIGH (ref 15–150)
Iron Saturation: 35 % (ref 15–55)
Iron: 110 ug/dL (ref 27–139)
Total Iron Binding Capacity: 316 ug/dL (ref 250–450)
UIBC: 206 ug/dL (ref 118–369)

## 2023-03-07 LAB — CBC WITH DIFFERENTIAL/PLATELET
Basophils Absolute: 0 10*3/uL (ref 0.0–0.2)
Basos: 1 %
EOS (ABSOLUTE): 0 10*3/uL (ref 0.0–0.4)
Eos: 1 %
Hematocrit: 31.7 % — ABNORMAL LOW (ref 34.0–46.6)
Hemoglobin: 10.5 g/dL — ABNORMAL LOW (ref 11.1–15.9)
Immature Grans (Abs): 0 10*3/uL (ref 0.0–0.1)
Immature Granulocytes: 0 %
Lymphocytes Absolute: 1.5 10*3/uL (ref 0.7–3.1)
Lymphs: 23 %
MCH: 31.9 pg (ref 26.6–33.0)
MCHC: 33.1 g/dL (ref 31.5–35.7)
MCV: 96 fL (ref 79–97)
Monocytes Absolute: 0.8 10*3/uL (ref 0.1–0.9)
Monocytes: 13 %
Neutrophils Absolute: 3.9 10*3/uL (ref 1.4–7.0)
Neutrophils: 62 %
Platelets: 213 10*3/uL (ref 150–450)
RBC: 3.29 x10E6/uL — ABNORMAL LOW (ref 3.77–5.28)
RDW: 12.2 % (ref 11.7–15.4)
WBC: 6.3 10*3/uL (ref 3.4–10.8)

## 2023-03-07 LAB — BASIC METABOLIC PANEL
BUN/Creatinine Ratio: 31 — ABNORMAL HIGH (ref 12–28)
BUN: 57 mg/dL — ABNORMAL HIGH (ref 8–27)
CO2: 17 mmol/L — ABNORMAL LOW (ref 20–29)
Calcium: 9.4 mg/dL (ref 8.7–10.3)
Chloride: 105 mmol/L (ref 96–106)
Creatinine, Ser: 1.81 mg/dL — ABNORMAL HIGH (ref 0.57–1.00)
Glucose: 121 mg/dL — ABNORMAL HIGH (ref 70–99)
Potassium: 4.1 mmol/L (ref 3.5–5.2)
Sodium: 144 mmol/L (ref 134–144)
eGFR: 28 mL/min/{1.73_m2} — ABNORMAL LOW (ref >59)

## 2023-03-07 LAB — PROTIME-INR
INR: 2.2 — ABNORMAL HIGH (ref 0.9–1.2)
Prothrombin Time: 23.5 s — ABNORMAL HIGH (ref 9.1–12.0)

## 2023-03-07 LAB — HEMOGLOBIN A1C
Est. average glucose Bld gHb Est-mCnc: 105 mg/dL
Hgb A1c MFr Bld: 5.3 % (ref 4.8–5.6)

## 2023-03-07 LAB — TSH RFX ON ABNORMAL TO FREE T4: TSH: 1.36 u[IU]/mL (ref 0.450–4.500)

## 2023-03-07 MED ORDER — DAPAGLIFLOZIN PROPANEDIOL 5 MG PO TABS: 5.0000 mg | ORAL_TABLET | Freq: Every day | ORAL | 2 refills | Status: DC

## 2023-03-07 MED ORDER — ROSUVASTATIN CALCIUM 10 MG PO TABS: 10.0000 mg | ORAL_TABLET | Freq: Every day | ORAL | 3 refills | Status: DC

## 2023-03-07 NOTE — Telephone Encounter (Signed)
Spoke to patient's daughter Larita Fife) around 2:15PM today to discuss recent lab work and medication changes due to changes in kidney function.   Discussed INR level is within therapeutic range of 2.0-3.0. Discussed that the patient is more likely to be at risk for overanticoagulation and easy bleeding. May need lower doses if kidney function continues to decline. Will keep the dose the same at this time. Discussed S&S of blood being too thin- plans to come to office to have INR assessed until she has appointment with cardiology.   Reviewed CBC & iron panel. Advised her that the patient should hold her ferrous sulfate 325mg  at this time. Her iron studies came back within acceptable range, with an elevated ferritin level. Discussed that anemia is most likely due to CKD. Advised her to discuss if Epogen would be beneficial to help improve her hemoglobin & hematocrit.   Discussed stopping fenofibrate due to decrease in kidney function. Also, based on UpToDate, rosuvastatin should be decreased. Updated prescription to 10mg  daily. Will continue to monitor lipid function. Did not repeat at visit, due to patient not fasting.   Discussed results of hemoglobin A1c. Reasonable to stop Tradjenta. Hemoglobin A1c was 6.7 about two years ago, but currently 5.3.    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 Advised to reach out to nephrology (where she is already established). Serum creatinine 1.81 & eGFR 28. Normal potassium. Reasonable to start SGLT2, specifically Marcelline Deist, to help protect further decline in kidney function. Prescription sent to pharmacy.   Due to decline in kidney function and recent hypotensive episodes, would be reasonable to adjust BP medications. Would normally defer to cardiology; however, patient does not have an appt until 04/19/2023. Advised patient to continue to monitor her blood pressure readings at home. Daughter reports that she took it today and "did not say  if it was high or low." Patient is currently taking amlodipine 10mg  daily, labetalol 200mg  twice daily, hydralazine 50mg  twice daily, and valsartan 160mg  daily. She thought she was taking valsartan 320mg ; however, daughter clarified that she is taking 1/2 tablet of this daily. Due to recommendations regarding ARBs with CKD, stop valsartan. Counseled about monitoring blood pressure readings closely.   Daughter verbalized an understanding and will reach out with any additional questions.   Alyson Reedy, FNP-C Primary Care and Sports Medicine [facility]

## 2023-03-07 NOTE — Telephone Encounter (Signed)
Spoke with patient's daughter.

## 2023-03-07 NOTE — Telephone Encounter (Signed)
Copied from CRM [id number]. Topic: Referral - Status >> Mar 07, 2023 10:30 AM Maxwell Marion wrote: Reason for CRM: Pt's daughter Larita Fife called for [facility], please give daughter a call back at [phone]. It is okay to leave vm with information if she doesn't answer

## 2023-03-08 ENCOUNTER — Ambulatory Visit (HOSPITAL_BASED_OUTPATIENT_CLINIC_OR_DEPARTMENT_OTHER): Payer: Self-pay | Admitting: Family Medicine

## 2023-03-08 ENCOUNTER — Encounter (HOSPITAL_BASED_OUTPATIENT_CLINIC_OR_DEPARTMENT_OTHER): Payer: Self-pay | Admitting: Family Medicine

## 2023-03-08 ENCOUNTER — Encounter: Payer: Self-pay | Admitting: Physical Medicine and Rehabilitation
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
## 2023-03-08 NOTE — Telephone Encounter (Signed)
LVM with patients daughter. Patient needs to follow up with ortho regarding her pain, she is supposed to be getting an injection done with ortho

## 2023-03-09 ENCOUNTER — Encounter (HOSPITAL_BASED_OUTPATIENT_CLINIC_OR_DEPARTMENT_OTHER): Payer: Self-pay

## 2023-03-09 ENCOUNTER — Other Ambulatory Visit: Payer: Self-pay | Admitting: Psychiatry

## 2023-03-09 ENCOUNTER — Encounter (HOSPITAL_BASED_OUTPATIENT_CLINIC_OR_DEPARTMENT_OTHER): Payer: Self-pay | Admitting: Family Medicine

## 2023-03-09 DIAGNOSIS — F331 Major depressive disorder, recurrent, moderate: Secondary | ICD-10-CM

## 2023-03-09 DIAGNOSIS — E039 Hypothyroidism, unspecified: Secondary | ICD-10-CM | POA: Diagnosis not present

## 2023-03-09 DIAGNOSIS — D631 Anemia in chronic kidney disease: Secondary | ICD-10-CM | POA: Diagnosis not present

## 2023-03-09 DIAGNOSIS — N390 Urinary tract infection, site not specified: Secondary | ICD-10-CM | POA: Diagnosis not present

## 2023-03-09 DIAGNOSIS — F338 Other recurrent depressive disorders: Secondary | ICD-10-CM

## 2023-03-09 DIAGNOSIS — N184 Chronic kidney disease, stage 4 (severe): Secondary | ICD-10-CM | POA: Diagnosis not present

## 2023-03-09 DIAGNOSIS — F411 Generalized anxiety disorder: Secondary | ICD-10-CM

## 2023-03-09 DIAGNOSIS — F4001 Agoraphobia with panic disorder: Secondary | ICD-10-CM

## 2023-03-09 DIAGNOSIS — R413 Other amnesia: Secondary | ICD-10-CM | POA: Diagnosis not present

## 2023-03-09 DIAGNOSIS — E1122 Type 2 diabetes mellitus with diabetic chronic kidney disease: Secondary | ICD-10-CM | POA: Diagnosis not present

## 2023-03-09 DIAGNOSIS — E782 Mixed hyperlipidemia: Secondary | ICD-10-CM | POA: Diagnosis not present

## 2023-03-09 DIAGNOSIS — I129 Hypertensive chronic kidney disease with stage 1 through stage 4 chronic kidney disease, or unspecified chronic kidney disease: Secondary | ICD-10-CM | POA: Diagnosis not present

## 2023-03-09 DIAGNOSIS — R2689 Other abnormalities of gait and mobility: Secondary | ICD-10-CM | POA: Diagnosis not present

## 2023-03-12 ENCOUNTER — Other Ambulatory Visit (HOSPITAL_BASED_OUTPATIENT_CLINIC_OR_DEPARTMENT_OTHER): Payer: Self-pay | Admitting: Family Medicine

## 2023-03-12 DIAGNOSIS — N184 Chronic kidney disease, stage 4 (severe): Secondary | ICD-10-CM | POA: Diagnosis not present

## 2023-03-12 DIAGNOSIS — M3501 Sicca syndrome with keratoconjunctivitis: Secondary | ICD-10-CM

## 2023-03-12 DIAGNOSIS — I129 Hypertensive chronic kidney disease with stage 1 through stage 4 chronic kidney disease, or unspecified chronic kidney disease: Secondary | ICD-10-CM | POA: Diagnosis not present

## 2023-03-12 DIAGNOSIS — N390 Urinary tract infection, site not specified: Secondary | ICD-10-CM | POA: Diagnosis not present

## 2023-03-12 DIAGNOSIS — F339 Major depressive disorder, recurrent, unspecified: Secondary | ICD-10-CM

## 2023-03-12 DIAGNOSIS — E782 Mixed hyperlipidemia: Secondary | ICD-10-CM | POA: Diagnosis not present

## 2023-03-12 DIAGNOSIS — Z79899 Other long term (current) drug therapy: Secondary | ICD-10-CM

## 2023-03-12 DIAGNOSIS — R2689 Other abnormalities of gait and mobility: Secondary | ICD-10-CM | POA: Diagnosis not present

## 2023-03-12 DIAGNOSIS — I10 Essential (primary) hypertension: Secondary | ICD-10-CM

## 2023-03-12 DIAGNOSIS — R413 Other amnesia: Secondary | ICD-10-CM | POA: Diagnosis not present

## 2023-03-12 DIAGNOSIS — E1122 Type 2 diabetes mellitus with diabetic chronic kidney disease: Secondary | ICD-10-CM | POA: Diagnosis not present

## 2023-03-12 DIAGNOSIS — M797 Fibromyalgia: Secondary | ICD-10-CM

## 2023-03-12 DIAGNOSIS — D631 Anemia in chronic kidney disease: Secondary | ICD-10-CM | POA: Diagnosis not present

## 2023-03-12 DIAGNOSIS — E039 Hypothyroidism, unspecified: Secondary | ICD-10-CM | POA: Diagnosis not present

## 2023-03-12 NOTE — Telephone Encounter (Signed)
Discussed with patient's daughter.  Awaiting insurance approval and then will be able to schedule with Dr. Alvester Morin.

## 2023-03-22 DIAGNOSIS — D485 Neoplasm of uncertain behavior of skin: Secondary | ICD-10-CM | POA: Diagnosis not present

## 2023-03-22 DIAGNOSIS — D0471 Carcinoma in situ of skin of right lower limb, including hip: Secondary | ICD-10-CM | POA: Diagnosis not present

## 2023-03-22 DIAGNOSIS — Z85828 Personal history of other malignant neoplasm of skin: Secondary | ICD-10-CM | POA: Diagnosis not present

## 2023-03-22 DIAGNOSIS — L570 Actinic keratosis: Secondary | ICD-10-CM | POA: Diagnosis not present

## 2023-03-23 ENCOUNTER — Telehealth (HOSPITAL_BASED_OUTPATIENT_CLINIC_OR_DEPARTMENT_OTHER): Payer: Self-pay

## 2023-03-23 NOTE — Telephone Encounter (Signed)
 LVM for patients daughter to return call, daughter stated last office visit to only call her regarding her mom. Calling daughter to see why patient no longer wants home health. Copied from CRM [id number]. Topic: Clinical - Home Health Verbal Orders >> Mar 23, 2023  9:55 AM Antonio DEL wrote: Shona with [facility] would like to let patient's PCP know that she is refusing future visits and she has tried everything. She can be reached at [phone] if there are any questions.

## 2023-03-26 ENCOUNTER — Other Ambulatory Visit: Payer: Self-pay | Admitting: Family Medicine

## 2023-03-26 DIAGNOSIS — Z1231 Encounter for screening mammogram for malignant neoplasm of breast: Secondary | ICD-10-CM

## 2023-03-29 ENCOUNTER — Encounter: Payer: Self-pay | Admitting: Physical Medicine and Rehabilitation

## 2023-03-29 ENCOUNTER — Other Ambulatory Visit: Payer: Self-pay

## 2023-03-29 ENCOUNTER — Ambulatory Visit: Payer: Medicare PPO | Admitting: Physical Medicine and Rehabilitation

## 2023-03-29 ENCOUNTER — Other Ambulatory Visit (HOSPITAL_BASED_OUTPATIENT_CLINIC_OR_DEPARTMENT_OTHER): Payer: Self-pay | Admitting: Family Medicine

## 2023-03-29 VITALS — BP 95/59 | HR 76

## 2023-03-29 DIAGNOSIS — M5416 Radiculopathy, lumbar region: Secondary | ICD-10-CM | POA: Diagnosis not present

## 2023-03-29 MED ORDER — METHYLPREDNISOLONE ACETATE 40 MG/ML IJ SUSP
40.0000 mg | Freq: Once | INTRAMUSCULAR | Status: AC
  Administered 2023-03-29: 40 mg

## 2023-03-29 NOTE — Progress Notes (Signed)
 Intense (8)    Cannot complete any ADLs without much assistance/cannot concentrate/conversation is difficult/unable to sleep and unable to use distraction. Severe range order  Average Pain 8  Patient states she has pain in lower back on left side that radiates down into her left leg. Hurts to sit, stand, and walk, mostly when applying weight.  Numbness and tingling in left leg.  Ambulates with a cane. Not taking any pain medicine.   +Driver, +BT-Coumadin , -Dye Allergies.

## 2023-03-30 ENCOUNTER — Encounter (HOSPITAL_BASED_OUTPATIENT_CLINIC_OR_DEPARTMENT_OTHER): Payer: Self-pay | Admitting: Family Medicine

## 2023-04-02 ENCOUNTER — Telehealth: Payer: Self-pay | Admitting: Physical Medicine and Rehabilitation

## 2023-04-02 NOTE — Telephone Encounter (Signed)
 Patient called advised she is hurting worse since she had the epidural. Patient asked if she can get a call back as soon as possible. Patient said the pain is more intense this time.  The number to contact patient is [phone]

## 2023-04-03 ENCOUNTER — Encounter (HOSPITAL_BASED_OUTPATIENT_CLINIC_OR_DEPARTMENT_OTHER): Payer: Self-pay | Admitting: Family Medicine

## 2023-04-03 ENCOUNTER — Ambulatory Visit (HOSPITAL_BASED_OUTPATIENT_CLINIC_OR_DEPARTMENT_OTHER): Payer: Medicare PPO | Admitting: Family Medicine

## 2023-04-03 VITALS — BP 134/66 | HR 55 | Ht 62.0 in | Wt 138.0 lb

## 2023-04-03 DIAGNOSIS — N184 Chronic kidney disease, stage 4 (severe): Secondary | ICD-10-CM | POA: Diagnosis not present

## 2023-04-03 DIAGNOSIS — I5189 Other ill-defined heart diseases: Secondary | ICD-10-CM

## 2023-04-03 DIAGNOSIS — I2699 Other pulmonary embolism without acute cor pulmonale: Secondary | ICD-10-CM

## 2023-04-03 DIAGNOSIS — E1122 Type 2 diabetes mellitus with diabetic chronic kidney disease: Secondary | ICD-10-CM | POA: Diagnosis not present

## 2023-04-03 DIAGNOSIS — I10 Essential (primary) hypertension: Secondary | ICD-10-CM | POA: Diagnosis not present

## 2023-04-03 NOTE — Progress Notes (Signed)
 Subjective:   Andrea Simmons 23-Aug-1940 04/03/2023  Chief Complaint  Patient presents with   Medical Management of Chronic Issues    Patient is here today following up on her BP.    HPI: Andrea Simmons presents today for re-assessment and management of chronic medical conditions.  Patient is here for follow-up of chronic conditions due to titration of several medications per chart review by her previous PCP Tawni Arts.  Per chart review, patient's medications have been titrated due to decreased renal function due to CKD.  Most recent medication changes include: Ferrous sulfate  discontinued on 03/08/2023 Rosuvastatin  discontinued on 03/08/2023 Tradjenta  discontinued on 03/08/2023 Valsartan  discontinued on 03/08/2023  Patient was told to discontinue these medications due to worsening renal function and progression of CKD to stage IV.  Patient is followed by nephrology and has an upcoming appointment with them in February.  HYPERTENSION: Andrea Simmons presents for the medical management of hypertension.  Patient is scheduled to meet with cardiology on 04/19/2023 for management of hypertension and likely mild CHF as evidenced by her 2022 echocardiogram showing grade 1 diastolic dysfunction with a EF of 55 to 60%. Patient's current hypertension medication regimen is: Amlodipine  10mg , Labetalol  200mg  BID, Hydralazine  50mg  BID.  Patient is  currently taking prescribed medications for HTN.  Patient is  regularly keeping a check on BP at home.  Average systolic readings are : 130's-160's, diastolic average range from 60-90's.  Adhering to low sodium diet: Yes Exercising Regularly: Yes, as mobility allows.  Denies headache, dizziness, CP, SHOB, vision changes.    BP Readings from Last 3 Encounters:  04/03/23 134/66  03/29/23 (!) 95/59  03/06/23 (!) 102/57    ANTICOAGULATION:  Patient's anticoagulation has been monitored by Tawni Arts, FNP for the past several  months.  Patient has been within normal limits for anticoagulant use per chart review.  Will obtain every 4 week INR today with lab work.  Recommend due to patient's multiple comorbidities that she be further managed with anticoagulation clinic.  Patient agreeable to this.  Patient takes Coumadin  5 mg currently.  She takes 1 whole tablet on Mondays, Wednesdays, and Saturdays.  She takes a half a tablet on Sundays, Tuesdays, Thursdays, Fridays.  Lab Results  Component Value Date   INR 1.2 04/03/2023   INR 2.2 (H) 03/06/2023   INR 2.4 (H) 03/03/2023    CHRONIC KIDNEY DISEASE: Andrea Simmons presents for the medical management of Chronic Kidney Disease. She is scheduled to see Nephrology (Dr. Macel) with [facility] in February.  Patient is  adhering to renal diet. Patient is not on ACE1/ARB therapy. Patient was taken of Valsartan  by Evalene Arts, FNP in December 2024 due to worsening renal function.  Patient is  avoiding NSAIDS.     Does not attend dialysis and does not perform peritoneal dialysis.   Lab Results  Component Value Date   NA 139 04/03/2023   K 4.3 04/03/2023   CO2 19 (L) 04/03/2023   GLUCOSE 116 (H) 04/03/2023   BUN 35 (H) 04/03/2023   CREATININE 1.20 (H) 04/03/2023   CALCIUM  9.2 04/03/2023   GFR 33.40 (L) 02/16/2022   EGFR 46 (L) 04/03/2023   GFRNONAA 30 (L) 03/03/2023    DIABETES MELLITUS: Andrea Simmons presents for the medical management of diabetes. She was recently discontinued from Tradjenta  due to worsening CKD in Dec. 2024.  Current diabetes medication regimen: Farxiga  5mg  Patient is  adhering to a diabetic diet.  Patient  is  exercising regularly.  Patient is  checking BS regularly. Avg: 90s- 130's Patient is  checking their feet regularly.  Denies polydipsia, polyphagia, polyuria, open wounds or ulcers on feet.  Lab Results  Component Value Date   HGBA1C 5.3 03/06/2023    02/16/2022 Lab Results  Component Value Date   MICROALBUR 1.6  02/16/2022   MICROALBUR 2.8 (H) 01/10/2019    Wt Readings from Last 3 Encounters:  04/03/23 138 lb (62.6 kg)  03/06/23 136 lb (61.7 kg)  03/03/23 135 lb (61.2 kg)    The following portions of the patient's history were reviewed and updated as appropriate: past medical history, past surgical history, family history, social history, allergies, medications, and problem list.   Patient Active Problem List   Diagnosis Date Noted   Grade I diastolic dysfunction 04/04/2023   Acute venous embolism and thrombosis of deep vessels of proximal lower extremity (HCC) 12/04/2022   Snoring 11/13/2022   Depression, recurrent (HCC) 09/28/2022   CKD (chronic kidney disease) stage 4, GFR 15-29 ml/min (HCC) 08/09/2021   Tear film insufficiency 04/19/2021   Sjogren's disease (HCC) 01/24/2021   Abnormal gait 12/10/2020   Hereditary and idiopathic neuropathy, unspecified 12/10/2020   Osteoarthritis 12/10/2020   Esophageal dysphagia 09/16/2020   Dehydration 09/16/2020   AKI (acute kidney injury) (HCC) 09/16/2020   Elevated lipase 09/16/2020   Vitamin D  deficiency 10/01/2019   Polypharmacy 10/01/2019   Major depressive disorder, recurrent episode, moderate (HCC) 10/01/2019   Leg edema 06/04/2019   Chest pain of uncertain etiology 12/25/2018   Hx of pulmonary embolus 03/30/2017   Hyperlipidemia 12/26/2013   Encounter for therapeutic drug monitoring 04/16/2013   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   Diabetes type 2, controlled (HCC) 08/27/2008   ANEMIA-NOS 08/27/2008   Seasonal and perennial allergic rhinitis 08/27/2008   Lung nodule 08/27/2008   Fibromyalgia 08/27/2008   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 (HCC) 03/20/2007   Hypothyroidism 03/20/2007   Essential hypertension 03/20/2007   Recurrent pulmonary emboli (HCC) 03/20/2007   GERD 03/20/2007   ESOPHAGEAL STRICTURE 03/08/2007   Past Medical History:  Diagnosis Date   Anemia associated with stage 3  chronic renal failure (HCC) 05/15/2022   Anxiety    Breast cancer (HCC)    Chronic osteoarthritis    CKD stage 3b, GFR 30-44 ml/min (HCC) 01/25/2021   Depression    Diabetes mellitus type II    Diabetes type 2, controlled (HCC) 08/27/2008   Qualifier: Diagnosis of   By: Norleen MD, Lynwood ORN        Essential hypertension 03/20/2007   Qualifier: Diagnosis of   By: Joshua RN, CGRN, Sheri         Exertional dyspnea 11/09/2008   11/20/2017  Walked RA x 3 laps @ 185 ft each stopped due to  End of study, fast pace, no desat, min sob   - Echo 12/05/2017 - LVEF 60-65%, moderate LVH, normal wall motion, grade 1 DD,   indeterminate LV filling pressure, normal GLS at -21%, normal LA   size, trivial TR, RVSP 20 mmHg, normal IVC.           Fibromyalgia    GERD (gastroesophageal reflux disease)    Hyperlipidemia    Hypertension    Hypothyroidism    Malignant neoplasm of breast (female), unspecified site 1993   L breast s/p mastectomy and tamoxifen x 48yrs   Osteoporosis    Other pulmonary embolism and infarction 2008 and 2009  chronic anticoag - [facility]   Past Surgical History:  Procedure Laterality Date   APPENDECTOMY     BALLOON DILATION N/A 10/18/2020   Procedure: BALLOON DILATION;  Surgeon: Cindie Carlin POUR, DO;  [facility]: [facility];  Service: Endoscopy;  Laterality: N/A;   BIOPSY  10/18/2020   Procedure: BIOPSY;  Surgeon: Cindie Carlin POUR, DO;  Location: [facility];  Service: Endoscopy;;  gastric    BREAST BIOPSY Left 1993   BREAST BIOPSY Right 09/25/2014   stero. Benign   BREAST RECONSTRUCTION Left    CATARACT EXTRACTION     ESOPHAGOGASTRODUODENOSCOPY (EGD) WITH PROPOFOL  N/A 10/18/2020   Procedure: ESOPHAGOGASTRODUODENOSCOPY (EGD) WITH PROPOFOL ;  Surgeon: Cindie Carlin POUR, DO;  Location: [facility];  Service: Endoscopy;  Laterality: N/A;  11:00am   MASTECTOMY Left    ROTATOR CUFF REPAIR     SPINAL FUSION      x 2   TOTAL ABDOMINAL HYSTERECTOMY W/ BILATERAL SALPINGOOPHORECTOMY      TUBAL LIGATION     Family History  Problem Relation Age of Onset   Depression Other        Parent   Arthritis Other        Parent, Grandparent   Hypertension Other        Grandparent   Hyperlipidemia Other        FMH   Miscarriages / Stillbirths Other        Grandparent   Stroke Other        FMH   Cancer Maternal Uncle        prostate   Hypertension Maternal Grandfather    Breast cancer Cousin    Breast cancer Cousin    Outpatient Medications Prior to Visit  Medication Sig Dispense Refill   ACCU-CHEK GUIDE test strip CHECK BLOOD SUGARS DAILY 100 strip 12   Accu-Chek Softclix Lancets lancets USE AS INSTRUCTED TO TEST SUGARS TWICE A DAY 100 each 12   amLODipine  (NORVASC ) 10 MG tablet TAKE 1 TABLET BY MOUTH EVERY DAY 90 tablet 2   Biotin 1000 MCG tablet Take 1,000 mcg by mouth daily.     Blood Glucose Monitoring Suppl (ACCU-CHEK GUIDE) w/Device KIT 1 each by Does not apply route 2 (two) times daily. To test sugars. Dx. E11.9 1 kit 1   dapagliflozin  propanediol (FARXIGA ) 5 MG TABS tablet Take 1 tablet (5 mg total) by mouth daily. 30 tablet 2   DULoxetine  (CYMBALTA ) 30 MG capsule Take 3 capsules (90 mg total) by mouth daily. 270 capsule 3   eszopiclone  (LUNESTA ) 2 MG TABS tablet Take 1 tablet (2 mg total) by mouth at bedtime as needed for sleep. Take immediately before bedtime 30 tablet 5   fenofibrate (TRICOR) 48 MG tablet Take 48 mg by mouth daily.     FLAREX 0.1 % ophthalmic suspension Apply to eye.     furosemide  (LASIX ) 20 MG tablet TAKE 1 TABLET BY MOUTH EVERY DAY AS NEEDED 90 tablet 1   hydrALAZINE  (APRESOLINE ) 100 MG tablet Take 0.5 tablets (50 mg total) by mouth 2 (two) times daily. 90 tablet 0   labetalol  (NORMODYNE ) 200 MG tablet TAKE 1 TABLET BY MOUTH TWICE A DAY 180 tablet 2   levothyroxine  (SYNTHROID ) 25 MCG tablet TAKE 1 TABLET BY MOUTH EVERY DAY BEFORE BREAKFAST 90 tablet 1   Magnesium  500 MG TABS Take 500 mg by mouth daily.     rosuvastatin  (CRESTOR ) 10 MG tablet  Take 1 tablet (10 mg total) by mouth daily. 30  tablet 3   valsartan  (DIOVAN ) 320 MG tablet TAKE 1 TABLET BY MOUTH EVERY DAY 90 tablet 2   warfarin (COUMADIN ) 5 MG tablet TAKE 1/2 TABLET (2.5 MG) BY MOUTH DAILY EXCEPT TAKE 1 TABLET (5 MG) ON MONDAYS, WEDNESDAYS AND SATURDAYS OR AS DIRECTED BY ANTICOAGULATION CLINIC 75 tablet 2   ferrous sulfate  325 (65 FE) MG tablet Take 1 tablet (325 mg total) by mouth daily. 90 tablet 1   gabapentin  (NEURONTIN ) 100 MG capsule Take 1 capsule (100 mg total) by mouth at bedtime. 90 capsule 0   loteprednol (LOTEMAX) 0.5 % ophthalmic suspension SMARTSIG:In Eye(s)     No facility-administered medications prior to visit.   Allergies  Allergen Reactions   Lipitor [Atorvastatin]     Unknown   Morphine  Nausea And Vomiting   Meperidine Nausea And Vomiting   Naproxen Sodium Nausea And Vomiting     ROS: A complete ROS was performed with pertinent positives/negatives noted in the HPI. The remainder of the ROS are negative.    Objective:   Today's Vitals   04/03/23 1305 04/03/23 1325  BP: (!) 131/55 134/66  Pulse: (!) 55   SpO2: 98%   Weight: 138 lb (62.6 kg)   Height: 5' 2 (1.575 m)     Physical Exam          GENERAL: Well-appearing, in NAD. Well nourished.  SKIN: Pink, warm and dry. No rash, lesion, ulceration, or ecchymoses.  Head: Normocephalic. NECK: Trachea midline. Full ROM w/o pain or tenderness.  RESPIRATORY: Chest wall symmetrical. Respirations even and non-labored. Breath sounds clear to auscultation bilaterally.  CARDIAC: S1, S2 present, regular rate and rhythm without murmur or gallops. Peripheral pulses 2+ bilaterally.  MSK: Muscle tone and strength appropriate for age.  EXTREMITIES: Without clubbing, cyanosis, or edema.  NEUROLOGIC: No motor or sensory deficits. Steady, even gait. C2-C12 intact.  PSYCH/MENTAL STATUS: Alert, oriented x 3. Cooperative, appropriate mood and affect.   Results for orders placed or performed in visit on  04/03/23  Protime-INR  Result Value Ref Range   INR 1.2 0.9 - 1.2   Prothrombin Time 13.4 (H) 9.1 - 12.0 sec  Basic Metabolic Panel (BMET)  Result Value Ref Range   Glucose 116 (H) 70 - 99 mg/dL   BUN 35 (H) 8 - 27 mg/dL   Creatinine, Ser 8.79 (H) 0.57 - 1.00 mg/dL   eGFR 46 (L) >40 fO/fpw/8.26   BUN/Creatinine Ratio 29 (H) 12 - 28   Sodium 139 134 - 144 mmol/L   Potassium 4.3 3.5 - 5.2 mmol/L   Chloride 102 96 - 106 mmol/L   CO2 19 (L) 20 - 29 mmol/L   Calcium  9.2 8.7 - 10.3 mg/dL      Assessment & Plan:  1. 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  Will continue on current regimen for CKD and will follow-up as scheduled with nephrology.  Repeat renal function with PCP in approximately 3 months.  Discussed good renal diet, monitoring hydration, avoiding NSAIDs with patient. - Basic Metabolic Panel (BMET)  2. Controlled type 2 diabetes mellitus with stage 4 chronic kidney disease, without long-term current use of insulin  [facility]) Patient doing well with Farxiga  currently without adverse effects.  Last A1c well-controlled in December.  Will plan to repeat in approximately 3 to 4 months.  Discussed good dietary changes and regular exercise with patient.  3. Essential hypertension 4. Grade I diastolic dysfunction  Currently controlled on current regimen following discontinuation of valsartan .  Per chart review there is grade 1 diastolic dysfunction present which could be contributing to patient's fluctuation of blood pressure.  Patient will continue to monitor and follow-up with cardiology as scheduled. - Basic Metabolic Panel (BMET)   5. Recurrent pulmonary emboli (HCC) Will check patient's INR today with lab work and adjust anticoagulation with Coumadin  as needed pending result.  Patient would likely have better follow-through with a anticoagulation clinic.  Will discuss possible  referral with cardiology regarding this - Protime-INR   No orders of the defined types were placed in this encounter.  Lab Orders         Protime-INR         Basic Metabolic Panel (BMET)      Return in about 3 months (around 07/02/2023) for Follow up CKD, DM, HTN (Fasting labs at visit) .    Patient to reach out to office if new, worrisome, or unresolved symptoms arise or if no improvement in patient's condition. Patient verbalized understanding and is agreeable to treatment plan. All questions answered to patient's satisfaction.    Thersia Schuyler Stark, [place]

## 2023-04-04 ENCOUNTER — Ambulatory Visit: Payer: Medicare PPO | Admitting: Licensed Clinical Social Worker

## 2023-04-04 DIAGNOSIS — R2689 Other abnormalities of gait and mobility: Secondary | ICD-10-CM | POA: Diagnosis not present

## 2023-04-04 DIAGNOSIS — N390 Urinary tract infection, site not specified: Secondary | ICD-10-CM | POA: Diagnosis not present

## 2023-04-04 DIAGNOSIS — Z7984 Long term (current) use of oral hypoglycemic drugs: Secondary | ICD-10-CM | POA: Diagnosis not present

## 2023-04-04 DIAGNOSIS — R413 Other amnesia: Secondary | ICD-10-CM | POA: Diagnosis not present

## 2023-04-04 DIAGNOSIS — I129 Hypertensive chronic kidney disease with stage 1 through stage 4 chronic kidney disease, or unspecified chronic kidney disease: Secondary | ICD-10-CM | POA: Diagnosis not present

## 2023-04-04 DIAGNOSIS — I5189 Other ill-defined heart diseases: Secondary | ICD-10-CM | POA: Insufficient documentation

## 2023-04-04 DIAGNOSIS — E1122 Type 2 diabetes mellitus with diabetic chronic kidney disease: Secondary | ICD-10-CM | POA: Diagnosis not present

## 2023-04-04 LAB — BASIC METABOLIC PANEL
BUN/Creatinine Ratio: 29 — ABNORMAL HIGH (ref 12–28)
BUN: 35 mg/dL — ABNORMAL HIGH (ref 8–27)
CO2: 19 mmol/L — ABNORMAL LOW (ref 20–29)
Calcium: 9.2 mg/dL (ref 8.7–10.3)
Chloride: 102 mmol/L (ref 96–106)
Creatinine, Ser: 1.2 mg/dL — ABNORMAL HIGH (ref 0.57–1.00)
Glucose: 116 mg/dL — ABNORMAL HIGH (ref 70–99)
Potassium: 4.3 mmol/L (ref 3.5–5.2)
Sodium: 139 mmol/L (ref 134–144)
eGFR: 46 mL/min/{1.73_m2} — ABNORMAL LOW (ref >59)

## 2023-04-04 LAB — PROTIME-INR
INR: 1.2 (ref 0.9–1.2)
Prothrombin Time: 13.4 s — ABNORMAL HIGH (ref 9.1–12.0)

## 2023-04-04 NOTE — Progress Notes (Signed)
 Please call patient and let her know that we will need to adjust her Coumadin  as her INR is low and not in the therapeutic range.  What is her current dosing schedule just we make sure we have this correct?  Additionally, her kidney function did improve with stopping the valsartan  and starting Farxiga .  We will continue on this current medication regimen and follow with nephrology.

## 2023-04-05 ENCOUNTER — Encounter (HOSPITAL_BASED_OUTPATIENT_CLINIC_OR_DEPARTMENT_OTHER): Payer: Self-pay | Admitting: Family Medicine

## 2023-04-05 ENCOUNTER — Other Ambulatory Visit (HOSPITAL_BASED_OUTPATIENT_CLINIC_OR_DEPARTMENT_OTHER): Payer: Self-pay | Admitting: Family Medicine

## 2023-04-05 DIAGNOSIS — I2699 Other pulmonary embolism without acute cor pulmonale: Secondary | ICD-10-CM

## 2023-04-05 DIAGNOSIS — Z7901 Long term (current) use of anticoagulants: Secondary | ICD-10-CM

## 2023-04-08 NOTE — Procedures (Signed)
Lumbosacral Transforaminal Epidural Steroid Injection - Sub-Pedicular Approach with Fluoroscopic Guidance  Patient: Andrea Simmons      Date of Birth: Mar 01, 1941 MRN: [id number] PCP: Hilbert Bible, FNP      Visit Date: 03/29/2023   Universal Protocol:    Date/Time: 03/29/2023  Consent Given By: the patient  Position: PRONE  Additional Comments: Vital signs were monitored before and after the procedure. Patient was prepped and draped in the usual sterile fashion. The correct patient, procedure, and site was verified.   Injection Procedure Details:   Procedure diagnoses: Lumbar radiculopathy [M54.16]    Meds Administered:  Meds ordered this encounter  Medications   methylPREDNISolone acetate (DEPO-MEDROL) injection 40 mg    Laterality: Left  Location/Site: L4  Needle:5.0 in., 22 ga.  Short bevel or Quincke spinal needle  Needle Placement: Transforaminal  Findings:    -Comments: Excellent flow of contrast along the nerve, nerve root and into the epidural space.  Procedure Details: After squaring off the end-plates to get a true AP view, the C-arm was positioned so that an oblique view of the foramen as noted above was visualized. The target area is just inferior to the "nose of the scotty dog" or sub pedicular. The soft tissues overlying this structure were infiltrated with 2-3 ml. of 1% Lidocaine without Epinephrine.  The spinal needle was inserted toward the target using a "trajectory" view along the fluoroscope beam.  Under AP and lateral visualization, the needle was advanced so it did not puncture dura and was located close the 6 O'Clock position of the pedical in AP tracterory. Biplanar projections were used to confirm position. Aspiration was confirmed to be negative for CSF and/or blood. A 1-2 ml. volume of Isovue-250 was injected and flow of contrast was noted at each level. Radiographs were obtained for documentation purposes.   After attaining the  desired flow of contrast documented above, a 0.5 to 1.0 ml test dose of 0.25% Marcaine was injected into each respective transforaminal space.  The patient was observed for 90 seconds post injection.  After no sensory deficits were reported, and normal lower extremity motor function was noted,   the above injectate was administered so that equal amounts of the injectate were placed at each foramen (level) into the transforaminal epidural space.   Additional Comments:  No complications occurred Dressing: 2 x 2 sterile gauze and Band-Aid    Post-procedure details: Patient was observed during the procedure. Post-procedure instructions were reviewed.  Patient left the clinic in stable condition.

## 2023-04-08 NOTE — Progress Notes (Signed)
Andrea Simmons - 83 y.o. female MRN [id number]  Date of birth: March 17, 1941  Office Visit Note: Visit Date: 03/29/2023 PCP: Hilbert Bible, FNP Referred by: Alyson Reedy, FNP  Subjective: Chief Complaint  Patient presents with   Lower Back - Pain   HPI:  Andrea Simmons is a 83 y.o. female who comes in today for planned repeat Left L4-5  Lumbar Transforaminal epidural steroid injection with fluoroscopic guidance.  The patient has failed conservative care including home exercise, medications, time and activity modification.  This injection will be diagnostic and hopefully therapeutic.  Please see requesting physician notes for further details and justification. Patient received more than 50% pain relief from prior injection.  Andrea Simmons is well-known to [place] that she has had epidural injections in the past for fairly severe multifactorial lumbar stenosis at L4-5 but mainly with left radicular leg pain.  She evidently had an issue on 1220 that lasted a few minutes that was witnessed by her daughter that seem to have a lot of leg pain and almost whole body convulsions.  Not totally sure what to make of the overall quality of that 1 episode although could argue that it was such as spasming nerve pain that it did feel like that and clearly can be very great pain involved during those times.  It could be related obviously to the stenosis.  We are going to repeat the injection today and see how she does.  Consideration for orthopedic spine surgery or neurosurgical evaluation.  Referring: Hazle Nordmann, PA-C   ROS Otherwise per HPI.  Assessment & Plan: Visit Diagnoses:    ICD-10-CM   1. Lumbar radiculopathy  M54.16 XR C-ARM NO REPORT    Epidural Steroid injection    methylPREDNISolone acetate (DEPO-MEDROL) injection 40 mg      Plan: No additional findings.   Meds & Orders:  Meds ordered this encounter  Medications   methylPREDNISolone acetate (DEPO-MEDROL) injection 40 mg     Orders Placed This Encounter  Procedures   XR C-ARM NO REPORT   Epidural Steroid injection    Follow-up: Return if symptoms worsen or fail to improve.   Procedures: No procedures performed  Lumbosacral Transforaminal Epidural Steroid Injection - Sub-Pedicular Approach with Fluoroscopic Guidance  Patient: Andrea Simmons      Date of Birth: 07/01/1940 MRN: [id number] PCP: Hilbert Bible, FNP      Visit Date: 03/29/2023   Universal Protocol:    Date/Time: 03/29/2023  Consent Given By: the patient  Position: PRONE  Additional Comments: Vital signs were monitored before and after the procedure. Patient was prepped and draped in the usual sterile fashion. The correct patient, procedure, and site was verified.   Injection Procedure Details:   Procedure diagnoses: Lumbar radiculopathy [M54.16]    Meds Administered:  Meds ordered this encounter  Medications   methylPREDNISolone acetate (DEPO-MEDROL) injection 40 mg    Laterality: Left  Location/Site: L4  Needle:5.0 in., 22 ga.  Short bevel or Quincke spinal needle  Needle Placement: Transforaminal  Findings:    -Comments: Excellent flow of contrast along the nerve, nerve root and into the epidural space.  Procedure Details: After squaring off the end-plates to get a true AP view, the C-arm was positioned so that an oblique view of the foramen as noted above was visualized. The target area is just inferior to the "nose of the scotty dog" or sub pedicular. The soft tissues overlying this structure were infiltrated with 2-3 ml. of 1%  Lidocaine without Epinephrine.  The spinal needle was inserted toward the target using a "trajectory" view along the fluoroscope beam.  Under AP and lateral visualization, the needle was advanced so it did not puncture dura and was located close the 6 O'Clock position of the pedical in AP tracterory. Biplanar projections were used to confirm position. Aspiration was confirmed to be  negative for CSF and/or blood. A 1-2 ml. volume of Isovue-250 was injected and flow of contrast was noted at each level. Radiographs were obtained for documentation purposes.   After attaining the desired flow of contrast documented above, a 0.5 to 1.0 ml test dose of 0.25% Marcaine was injected into each respective transforaminal space.  The patient was observed for 90 seconds post injection.  After no sensory deficits were reported, and normal lower extremity motor function was noted,   the above injectate was administered so that equal amounts of the injectate were placed at each foramen (level) into the transforaminal epidural space.   Additional Comments:  No complications occurred Dressing: 2 x 2 sterile gauze and Band-Aid    Post-procedure details: Patient was observed during the procedure. Post-procedure instructions were reviewed.  Patient left the clinic in stable condition.    Clinical History: MRI LUMBAR SPINE WITHOUT CONTRAST   TECHNIQUE: Multiplanar, multisequence MR imaging of the lumbar spine was performed. No intravenous contrast was administered.   COMPARISON:  Prior study from 02/25/2019.   FINDINGS: Segmentation: Standard. Lowest well-formed disc space labeled the L5-S1 level.   Alignment: 6 mm facet mediated anterolisthesis of L4 on L5. Trace 2 mm retrolisthesis of T12 on L1 and L1 and L2.   Vertebrae: Compression deformity of the superior endplate of T11 with superimposed endplate Schmorl's node deformity, partially visualized. There is mild marrow edema at the anterior aspect of the partially visualized superior endplate, which could reflect a superimposed acute to subacute component (series 4, image 8). No retropulsion. Vertebral body height otherwise maintained with no other acute or chronic fracture. Bone marrow signal intensity within normal limits. No worrisome osseous lesions. No other abnormal marrow edema.   Conus medullaris and cauda equina:  Conus extends to the T12 level. Conus and cauda equina appear normal.   Paraspinal and other soft tissues: Unremarkable.   Disc levels:   T12-L1: Retrolisthesis with mild disc bulge and endplate spurring. Mild facet hypertrophy. No canal or foraminal stenosis.   L1-2: Retrolisthesis with mild disc bulge and endplate spurring. Mild facet hypertrophy. No spinal stenosis. Foramina remain patent.   L2-3: Disc desiccation with minimal disc bulge. Mild to moderate facet and ligament flavum hypertrophy. No significant spinal stenosis. Foramina remain patent.   L3-4: Mild degenerative intervertebral disc space narrowing. Disc desiccation with mild disc bulge. Moderate bilateral facet hypertrophy. No significant spinal stenosis. Foramina remain patent.   L4-5: 6 mm facet mediated anterolisthesis. Disc desiccation with associated broad posterior pseudo disc bulge/uncovering. Moderate to advanced bilateral facet arthrosis. Resultant moderate to severe canal with bilateral subarticular stenosis, with mild bilateral L4 foraminal narrowing.   L5-S1: Disc desiccation with mild disc bulge. Superimposed right foraminal disc protrusion with annular fissure contacts the exiting right L5 nerve root (series 8, image 5). Moderate bilateral facet hypertrophy. Resultant mild bilateral subarticular stenosis. Central canal remains patent. Moderate right L5 foraminal narrowing. Left or pharyngeal veins patent.   IMPRESSION: 1. 6 mm facet mediated anterolisthesis of L4 on L5 with resultant moderate to severe canal and bilateral subarticular stenosis, with mild bilateral L4 foraminal narrowing. 2. Right foraminal  disc protrusion at L5-S1, contacting and potentially irritating the exiting right L5 nerve root. 3. Additional mild for age spondylosis elsewhere within the lumbar spine as above. No other significant stenosis or neural impingement. 4. Compression deformity of the superior endplate of T11  with superimposed endplate Schmorl's node deformity, partially visualized. There is mild marrow edema at the anterior aspect of the partially visualized superior endplate, which could reflect a superimposed acute to subacute fracture component. Correlation with physical exam for possible pain at this location recommended.     Electronically Signed   By: Rise Mu M.D.   On: 04/01/2022 19:49     Objective:  VS:  HT:    WT:   BMI:     BP:(!) 95/59  HR:76bpm  TEMP: ( )  RESP:  Physical Exam Vitals and nursing note reviewed.  Constitutional:      General: She is not in acute distress.    Appearance: Normal appearance. She is well-developed. She is not ill-appearing.  HENT:     Head: Normocephalic and atraumatic.     Right Ear: External ear normal.     Left Ear: External ear normal.  Eyes:     Extraocular Movements: Extraocular movements intact.     Conjunctiva/sclera: Conjunctivae normal.     Pupils: Pupils are equal, round, and reactive to light.  Cardiovascular:     Rate and Rhythm: Normal rate.     Pulses: Normal pulses.  Pulmonary:     Effort: Pulmonary effort is normal. No respiratory distress.  Abdominal:     General: There is no distension.     Palpations: Abdomen is soft.  Musculoskeletal:        General: Tenderness present.     Cervical back: Neck supple.     Right lower leg: No edema.     Left lower leg: No edema.     Comments: Patient has good distal strength with no pain over the greater trochanters.  No clonus or focal weakness.  Skin:    General: Skin is warm and dry.     Findings: No erythema, lesion or rash.  Neurological:     General: No focal deficit present.     Mental Status: She is alert and oriented to person, place, and time.     Sensory: No sensory deficit.     Motor: No weakness or abnormal muscle tone.     Coordination: Coordination normal.     Gait: Gait normal.  Psychiatric:        Mood and Affect: Mood normal.         Behavior: Behavior normal.      Imaging: No results found.

## 2023-04-09 NOTE — Telephone Encounter (Signed)
Called and spoke with pt about the message that [place] had sent. When speaking with pt about her meds, she said that she is still taking the rosuvastatin.   Asked pt if she was still taking the fenofibrate and she said that she is no longer taking it.

## 2023-04-10 ENCOUNTER — Encounter: Payer: Self-pay | Admitting: Physical Medicine and Rehabilitation

## 2023-04-10 ENCOUNTER — Ambulatory Visit: Payer: Medicare PPO | Admitting: Physical Medicine and Rehabilitation

## 2023-04-10 DIAGNOSIS — M797 Fibromyalgia: Secondary | ICD-10-CM | POA: Diagnosis not present

## 2023-04-10 DIAGNOSIS — M5442 Lumbago with sciatica, left side: Secondary | ICD-10-CM | POA: Diagnosis not present

## 2023-04-10 DIAGNOSIS — M25552 Pain in left hip: Secondary | ICD-10-CM

## 2023-04-10 DIAGNOSIS — G8929 Other chronic pain: Secondary | ICD-10-CM | POA: Diagnosis not present

## 2023-04-10 NOTE — Telephone Encounter (Signed)
Jerre Simon [place], [place]     04/10/23 12:01 PM Okay, thank you.  Please have her continue on the atorvastatin as directed.  Regarding the fenofibrate, she may be restarted on it after checking her cholesterol.  This should be done with her upcoming cardiology appointment.  If they do not check her cholesterol at her appointment, we would be happy to do it for her.     Attempted to call pt but unable to reach. Left message for her to return call.

## 2023-04-10 NOTE — Progress Notes (Signed)
ALLERIA Simmons - 83 y.o. female MRN [id number]  Date of birth: 10/25/1940  Office Visit Note: Visit Date: 04/10/2023 PCP: Hilbert Bible, FNP Referred by: Hilbert Bible, *  Subjective: Chief Complaint  Patient presents with   Lower Back - Pain   HPI: Andrea Simmons is a 83 y.o. female who comes in today for evaluation of chronic, worsening and severe bilateral lower back pain radiating down left buttock and leg. Most significant pain to lateral side of calf region. Patient is well known to [place]. . She reports mechanical fall while caring for her husband in 2023, she feels this fall truly caused her symptoms. Her pain worsens with weight bearing and difficulty getting in and out of car. She describes her pain as sore, aching and tingling sensation, currently rates as 8 out of 10. Some relief of pain with home exercise regimen, rest and use of medications. Lumbar MRI imaging from January of 2024 shows 6 mm facet mediated anterolisthesis of L4 on L5 with resultant moderate to severe canal spinal canal stenosis. She has undergone both lumbar and cervical epidural steroid injections in our office with good relief of pain. More recently she underwent left L4 transforaminal epidural steroid injection in our office on 03/29/2023, reports worsening pain post injection. She is currently using cane to ambulate. No recent trauma or falls.   She was previously evaluated by my colleague Dr. Madelyn Brunner, found to have grade 1-2 strain/partial tear of the gastrocnemius muscle belly via ultrasound. She did undergo shockwave treatments with some relief of pain. CT of left hip from 2023 shows prominent degenerative hip arthropathy. Her husband currently resides at [facility] facility in [place].      Review of Systems  Musculoskeletal:  Positive for back pain, joint pain and myalgias.  Neurological:  Positive for tingling. Negative for focal weakness and weakness.  All other systems reviewed and are  negative.  Otherwise per HPI.  Assessment & Plan: Visit Diagnoses:    ICD-10-CM   1. Chronic bilateral low back pain with left-sided sciatica  M54.42 AMB referral to sports medicine   G89.29     2. Pain in left hip  M25.552 AMB referral to sports medicine    3. Fibromyalgia  M79.7 AMB referral to sports medicine       Plan: Findings:  Chronic, worsening and severe bilateral lower back pain radiating down left buttock and leg. Patient continues to have severe pain despite good conservative therapies such as home exercise regimen, rest and use of medications. Patients clinical presentation and exam are complex, differentials include neurogenic claudication as a result of spinal canal stenosis vs intrinsic left hip pathology. I also feel her fibromyalgia is working to exacerbate her pain. Recent left L4 transforaminal epidural steroid injection seemed to cause worsening pain. Dr. Alvester Morin and myself personally reviewed images from injection, the injection is well placed with no complications. She does voice that she is able to walk without significant pain. There is a concern that injection helped her more than she is aware. Her pain does seem to become severe with weight bearing and when getting in and out of car. She has pain to left hip with internal rotation, no real groin pain. Pretty significant arthritic changes noted to left hip on CT imaging from 2023. I would like to see her back to Dr. Shon Baton for further evaluation of left hip pain and possible ultrasound guided left hip injection. We are happy to see her back to  discuss possibility of repeating lumbar epidural steroid injections. No red flag symptoms noted upon exam today.     Meds & Orders: No orders of the defined types were placed in this encounter.   Orders Placed This Encounter  Procedures   AMB referral to sports medicine    Follow-up: Return if symptoms worsen or fail to improve.   Procedures: No procedures performed       Clinical History: MRI LUMBAR SPINE WITHOUT CONTRAST   TECHNIQUE: Multiplanar, multisequence MR imaging of the lumbar spine was performed. No intravenous contrast was administered.   COMPARISON:  Prior study from 02/25/2019.   FINDINGS: Segmentation: Standard. Lowest well-formed disc space labeled the L5-S1 level.   Alignment: 6 mm facet mediated anterolisthesis of L4 on L5. Trace 2 mm retrolisthesis of T12 on L1 and L1 and L2.   Vertebrae: Compression deformity of the superior endplate of T11 with superimposed endplate Schmorl's node deformity, partially visualized. There is mild marrow edema at the anterior aspect of the partially visualized superior endplate, which could reflect a superimposed acute to subacute component (series 4, image 8). No retropulsion. Vertebral body height otherwise maintained with no other acute or chronic fracture. Bone marrow signal intensity within normal limits. No worrisome osseous lesions. No other abnormal marrow edema.   Conus medullaris and cauda equina: Conus extends to the T12 level. Conus and cauda equina appear normal.   Paraspinal and other soft tissues: Unremarkable.   Disc levels:   T12-L1: Retrolisthesis with mild disc bulge and endplate spurring. Mild facet hypertrophy. No canal or foraminal stenosis.   L1-2: Retrolisthesis with mild disc bulge and endplate spurring. Mild facet hypertrophy. No spinal stenosis. Foramina remain patent.   L2-3: Disc desiccation with minimal disc bulge. Mild to moderate facet and ligament flavum hypertrophy. No significant spinal stenosis. Foramina remain patent.   L3-4: Mild degenerative intervertebral disc space narrowing. Disc desiccation with mild disc bulge. Moderate bilateral facet hypertrophy. No significant spinal stenosis. Foramina remain patent.   L4-5: 6 mm facet mediated anterolisthesis. Disc desiccation with associated broad posterior pseudo disc bulge/uncovering. Moderate  to advanced bilateral facet arthrosis. Resultant moderate to severe canal with bilateral subarticular stenosis, with mild bilateral L4 foraminal narrowing.   L5-S1: Disc desiccation with mild disc bulge. Superimposed right foraminal disc protrusion with annular fissure contacts the exiting right L5 nerve root (series 8, image 5). Moderate bilateral facet hypertrophy. Resultant mild bilateral subarticular stenosis. Central canal remains patent. Moderate right L5 foraminal narrowing. Left or pharyngeal veins patent.   IMPRESSION: 1. 6 mm facet mediated anterolisthesis of L4 on L5 with resultant moderate to severe canal and bilateral subarticular stenosis, with mild bilateral L4 foraminal narrowing. 2. Right foraminal disc protrusion at L5-S1, contacting and potentially irritating the exiting right L5 nerve root. 3. Additional mild for age spondylosis elsewhere within the lumbar spine as above. No other significant stenosis or neural impingement. 4. Compression deformity of the superior endplate of T11 with superimposed endplate Schmorl's node deformity, partially visualized. There is mild marrow edema at the anterior aspect of the partially visualized superior endplate, which could reflect a superimposed acute to subacute fracture component. Correlation with physical exam for possible pain at this location recommended.     Electronically Signed   By: Rise Mu M.D.   On: 04/01/2022 19:49   She reports that she has never smoked. She has never used smokeless tobacco.  Recent Labs    03/06/23 1629  HGBA1C 5.3    Objective:  VS:  HT:    WT:   BMI:     BP:   HR: bpm  TEMP: ( )  RESP:  Physical Exam Vitals and nursing note reviewed.  HENT:     Head: Normocephalic and atraumatic.     Right Ear: External ear normal.     Left Ear: External ear normal.     Nose: Nose normal.     Mouth/Throat:     Mouth: Mucous membranes are moist.  Eyes:     Extraocular  Movements: Extraocular movements intact.  Cardiovascular:     Rate and Rhythm: Normal rate.     Pulses: Normal pulses.  Pulmonary:     Effort: Pulmonary effort is normal.  Abdominal:     General: Abdomen is flat. There is no distension.  Musculoskeletal:        General: Tenderness present.     Cervical back: Normal range of motion.     Comments: Patient is slow to rise from seated position to standing. Good lumbar range of motion. No pain noted with facet loading. 5/5 strength noted with bilateral hip flexion, knee flexion/extension, ankle dorsiflexion/plantarflexion and EHL. No clonus noted bilaterally. No pain upon palpation of greater trochanters. Pain noted with internal rotation of left hip. Sensation intact bilaterally. Negative slump test bilaterally. Ambulates with cane, gait slow and unsteady.   Skin:    General: Skin is warm and dry.     Capillary Refill: Capillary refill takes less than 2 seconds.  Neurological:     Mental Status: She is alert and oriented to person, place, and time.     Gait: Gait abnormal.     Ortho Exam  Imaging: No results found.  Past Medical/Family/Surgical/Social History: Medications & Allergies reviewed per EMR, new medications updated. Patient Active Problem List   Diagnosis Date Noted   Grade I diastolic dysfunction 04/04/2023   Acute venous embolism and thrombosis of deep vessels of proximal lower extremity (HCC) 12/04/2022   Snoring 11/13/2022   Depression, recurrent (HCC) 09/28/2022   CKD (chronic kidney disease) stage 4, GFR 15-29 ml/min (HCC) 08/09/2021   Tear film insufficiency 04/19/2021   Sjogren's disease (HCC) 01/24/2021   Abnormal gait 12/10/2020   Hereditary and idiopathic neuropathy, unspecified 12/10/2020   Osteoarthritis 12/10/2020   Esophageal dysphagia 09/16/2020   Dehydration 09/16/2020   AKI (acute kidney injury) (HCC) 09/16/2020   Elevated lipase 09/16/2020   Vitamin D deficiency 10/01/2019   Polypharmacy 10/01/2019    Major depressive disorder, recurrent episode, moderate (HCC) 10/01/2019   Leg edema 06/04/2019   Chest pain of uncertain etiology 12/25/2018   Hx of pulmonary embolus 03/30/2017   Hyperlipidemia 12/26/2013   Encounter for therapeutic drug monitoring 04/16/2013   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   Diabetes type 2, controlled (HCC) 08/27/2008   ANEMIA-NOS 08/27/2008   Seasonal and perennial allergic rhinitis 08/27/2008   Lung nodule 08/27/2008   Fibromyalgia 08/27/2008   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 (HCC) 03/20/2007   Hypothyroidism 03/20/2007   Essential hypertension 03/20/2007   Recurrent pulmonary emboli (HCC) 03/20/2007   GERD 03/20/2007   ESOPHAGEAL STRICTURE 03/08/2007   Past Medical History:  Diagnosis Date   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) 05/15/2022   Anxiety    Breast cancer (HCC)    Chronic osteoarthritis    CKD stage 3b, GFR 30-44 ml/min (HCC) 01/25/2021   Depression    Diabetes mellitus type II    Diabetes type 2, controlled (HCC)  08/27/2008   Qualifier: Diagnosis of   By: Jonny Ruiz MD, Len Blalock        Essential hypertension 03/20/2007   Qualifier: Diagnosis of   By: Yetta Barre RN, CGRN, Sheri         Exertional dyspnea 11/09/2008   11/20/2017  Walked RA x 3 laps @ 185 ft each stopped due to  End of study, fast pace, no desat, min sob   - Echo 12/05/2017 - LVEF 60-65%, moderate LVH, normal wall motion, grade 1 DD,   indeterminate LV filling pressure, normal GLS at -21%, normal LA   size, trivial TR, RVSP 20 mmHg, normal IVC.           Fibromyalgia    GERD (gastroesophageal reflux disease)    Hyperlipidemia    Hypertension    Hypothyroidism    Malignant neoplasm of breast (female), unspecified site 1993   L breast s/p mastectomy and tamoxifen x 62yrs   Osteoporosis    Other pulmonary embolism and infarction 2008 and 2009   chronic anticoag - [facility]   Family History  Problem Relation Age of Onset    Depression Other        Parent   Arthritis Other        Parent, Grandparent   Hypertension Other        Grandparent   Hyperlipidemia Other        FMH   Miscarriages / Stillbirths Other        Grandparent   Stroke Other        FMH   Cancer Maternal Uncle        prostate   Hypertension Maternal Grandfather    Breast cancer Cousin    Breast cancer Cousin    Past Surgical History:  Procedure Laterality Date   APPENDECTOMY     BALLOON DILATION N/A 10/18/2020   Procedure: BALLOON DILATION;  Surgeon: Lanelle Bal, DO;  Location: [facility];  Service: Endoscopy;  Laterality: N/A;   BIOPSY  10/18/2020   Procedure: BIOPSY;  Surgeon: Lanelle Bal, DO;  Location: [facility];  Service: Endoscopy;;  gastric    BREAST BIOPSY Left 1993   BREAST BIOPSY Right 09/25/2014   stero. Benign   BREAST RECONSTRUCTION Left    CATARACT EXTRACTION     ESOPHAGOGASTRODUODENOSCOPY (EGD) WITH PROPOFOL N/A 10/18/2020   Procedure: ESOPHAGOGASTRODUODENOSCOPY (EGD) WITH PROPOFOL;  Surgeon: Lanelle Bal, DO;  Location: [facility];  Service: Endoscopy;  Laterality: N/A;  11:00am   MASTECTOMY Left    ROTATOR CUFF REPAIR     SPINAL FUSION      x 2   TOTAL ABDOMINAL HYSTERECTOMY W/ BILATERAL SALPINGOOPHORECTOMY     TUBAL LIGATION     Social History   Occupational History   Occupation: Software engineer: RETIRED   Occupation: Producer, television/film/video   Occupation: school bus driver  Tobacco Use   Smoking status: Never   Smokeless tobacco: Never  Vaping Use   Vaping status: Never Used  Substance and Sexual Activity   Alcohol use: No    Alcohol/week: 0.0 standard drinks of alcohol   Drug use: No   Sexual activity: Not Currently

## 2023-04-10 NOTE — Progress Notes (Signed)
She felt like the pain did get worse.  She stated her legs cramp at times.

## 2023-04-11 ENCOUNTER — Ambulatory Visit: Payer: Medicare PPO

## 2023-04-11 ENCOUNTER — Other Ambulatory Visit: Payer: Self-pay | Admitting: Family Medicine

## 2023-04-11 ENCOUNTER — Telehealth (HOSPITAL_BASED_OUTPATIENT_CLINIC_OR_DEPARTMENT_OTHER): Payer: Self-pay | Admitting: Family Medicine

## 2023-04-11 NOTE — Telephone Encounter (Signed)
Called and spoke with pt about meds and she verbalized understanding. Nothing further needed.

## 2023-04-11 NOTE — Telephone Encounter (Signed)
Called and spoke with pt letting her know the info per Jon Gills about labwork and she verbalized understanding. Nothing further needed.

## 2023-04-11 NOTE — Telephone Encounter (Signed)
Pt called stating she missed a call regarding her medications and she would like to go over those with someone please contact pt back

## 2023-04-12 ENCOUNTER — Other Ambulatory Visit (HOSPITAL_BASED_OUTPATIENT_CLINIC_OR_DEPARTMENT_OTHER): Payer: Medicare PPO

## 2023-04-12 DIAGNOSIS — Z7901 Long term (current) use of anticoagulants: Secondary | ICD-10-CM

## 2023-04-13 ENCOUNTER — Encounter (HOSPITAL_BASED_OUTPATIENT_CLINIC_OR_DEPARTMENT_OTHER): Payer: Self-pay

## 2023-04-13 ENCOUNTER — Other Ambulatory Visit (HOSPITAL_BASED_OUTPATIENT_CLINIC_OR_DEPARTMENT_OTHER): Payer: Self-pay | Admitting: Family Medicine

## 2023-04-13 ENCOUNTER — Telehealth (HOSPITAL_BASED_OUTPATIENT_CLINIC_OR_DEPARTMENT_OTHER): Payer: Self-pay | Admitting: Family Medicine

## 2023-04-13 DIAGNOSIS — Z7901 Long term (current) use of anticoagulants: Secondary | ICD-10-CM

## 2023-04-13 LAB — PROTIME-INR
INR: 1.7 — ABNORMAL HIGH (ref 0.9–1.2)
Prothrombin Time: 18 s — ABNORMAL HIGH (ref 9.1–12.0)

## 2023-04-13 NOTE — Progress Notes (Signed)
Please let pt know her INR has improved but is still slightly low. I would recommend increasing her Coumadin with the following schedule: Take 1 whole pill every day Sunday-Friday, 1/2 tablet on Saturday. Repeat INR in 10 days (04/23/23)

## 2023-04-13 NOTE — Telephone Encounter (Signed)
Pt was calling back about a phone call she received about her labs and medication please contact pt

## 2023-04-13 NOTE — Telephone Encounter (Signed)
Refer to lab results tab.

## 2023-04-16 ENCOUNTER — Encounter: Payer: Self-pay | Admitting: Sports Medicine

## 2023-04-16 ENCOUNTER — Other Ambulatory Visit: Payer: Self-pay

## 2023-04-16 ENCOUNTER — Ambulatory Visit: Payer: Medicare PPO | Admitting: Sports Medicine

## 2023-04-16 ENCOUNTER — Other Ambulatory Visit (INDEPENDENT_AMBULATORY_CARE_PROVIDER_SITE_OTHER): Payer: Self-pay

## 2023-04-16 DIAGNOSIS — M79662 Pain in left lower leg: Secondary | ICD-10-CM

## 2023-04-16 DIAGNOSIS — M5442 Lumbago with sciatica, left side: Secondary | ICD-10-CM

## 2023-04-16 DIAGNOSIS — M1612 Unilateral primary osteoarthritis, left hip: Secondary | ICD-10-CM | POA: Diagnosis not present

## 2023-04-16 DIAGNOSIS — M797 Fibromyalgia: Secondary | ICD-10-CM

## 2023-04-16 DIAGNOSIS — G8929 Other chronic pain: Secondary | ICD-10-CM | POA: Diagnosis not present

## 2023-04-16 DIAGNOSIS — M7918 Myalgia, other site: Secondary | ICD-10-CM

## 2023-04-16 MED ORDER — LIDOCAINE HCL 1 % IJ SOLN
4.0000 mL | INTRAMUSCULAR | Status: AC | PRN
  Administered 2023-04-16: 4 mL

## 2023-04-16 MED ORDER — METHYLPREDNISOLONE ACETATE 40 MG/ML IJ SUSP
40.0000 mg | INTRAMUSCULAR | Status: AC | PRN
  Administered 2023-04-16: 40 mg via INTRA_ARTICULAR

## 2023-04-16 NOTE — Progress Notes (Signed)
Andrea Simmons - 83 y.o. female MRN [id number]  Date of birth: 05-13-1940  Office Visit Note: Visit Date: 04/16/2023 PCP: Hilbert Bible, FNP Referred by: Hilbert Bible, *  Subjective: Chief Complaint  Patient presents with   Left Hip - Pain   HPI: Andrea Simmons is a pleasant 83 y.o. female who presents today for evaluation and treatment of acute on chronic left hip pain.  Also having pain in the lateral calf.  Left hip -pain is sometimes in the front of the hip but wraps around to the posterior aspect of the hip.  She has a long history of chronic low back pain which will radiate into the buttock as well.  He also has pain over the lateral aspect of the calf.  I have ultrasounded this in the past which had some chronic partial tearing tendinopathy of the gastrocnemius muscle belly but no full-thickness tearing.  Reviewed Ellin Goodie note (04/10/23) - Marlyn has had Lumbar MRI imaging from January of 2024 shows 6 mm facet mediated anterolisthesis of L4 on L5 with resultant moderate to severe canal spinal canal stenosis. She has undergone both lumbar and cervical epidural steroid injections in our office with good relief of pain. More recently she underwent left L4 transforaminal epidural steroid injection in our office on 03/29/2023, reports worsening pain post injection.  In terms of medication, she is on Cymbalta 90 mg daily.  She does have a history of fibromyalgia as well.  Has a history of type-II diabetes, but is well-controlled. Diet controlled.  Lab Results  Component Value Date   HGBA1C 5.3 03/06/2023   Pertinent ROS were reviewed with the patient and found to be negative unless otherwise specified above in HPI.   Assessment & Plan: Visit Diagnoses:  1. Unilateral primary osteoarthritis, left hip   2. Left buttock pain   3. Chronic left-sided low back pain with left-sided sciatica   4. Fibromyalgia   5. Pain of left calf    Plan: Impression is  symptomatic left hip advanced hip arthritis.  I do think her pain is multifactorial as she does have underlying fibromyalgia as well moderate to severe lumbar spinal stenosis.  She has had ESI injections by my partner Dr. Alvester Morin in the past, although her exam suggest the intra-articular hip is most bothersome today.  For both diagnostic and hopefully therapeutic purposes, we did proceed with ultrasound-guided left hip injection.  Advised on 48 hours of modified rest and activity.  She may use ice/heat or Tylenol for any postinjection pain.  I would like to see the degree of relief she gets over the next 2 weeks, she will update [place] on her improvement.  If for some reason she does not get good relief, it may be smart to have her reevaluated further for the back.  She will continue her Cymbalta 90 mg daily for her fibromyalgia.  Recommended continue to stay active as able.  I have seen her in the past for her calf pain and ultrasound of this which showed some irritation of the gastrocnemius, recommended continuing her compression socks, may use topical Voltaren gel as needed.  Follow-up: Return if symptoms worsen or fail to improve.   Meds & Orders: No orders of the defined types were placed in this encounter.   Orders Placed This Encounter  Procedures   Large Joint Inj: L hip joint   US Guided Needle Placement - No Linked Charges   XR HIP UNILAT W OR W/O PELVIS  2-3 VIEWS LEFT     Procedures: Large Joint Inj: L hip joint on 04/16/2023 10:51 AM Indications: pain Details: 22 G 3.5 in needle, ultrasound-guided anterior approach Medications: 4 mL lidocaine 1 %; 40 mg methylPREDNISolone acetate 40 MG/ML Outcome: tolerated well, no immediate complications  Procedure: US-guided intra-articular hip injection, left After discussion on risks/benefits/indications and informed verbal consent was obtained, a timeout was performed. Patient was lying supine on exam table. The hip was cleaned with betadine and  alcohol swabs. Then utilizing ultrasound guidance, the patient's femoral head and neck junction was identified and subsequently injected with 4:1 lidocaine:depomedrol via an in-plane approach with ultrasound visualization of the injectate administered into the hip joint. Patient tolerated procedure well without immediate complications.  Procedure, treatment alternatives, risks and benefits explained, specific risks discussed. Consent was given by the patient. Immediately prior to procedure a time out was called to verify the correct patient, procedure, equipment, support staff and site/side marked as required. Patient was prepped and draped in the usual sterile fashion.          Clinical History: MRI LUMBAR SPINE WITHOUT CONTRAST   TECHNIQUE: Multiplanar, multisequence MR imaging of the lumbar spine was performed. No intravenous contrast was administered.   COMPARISON:  Prior study from 02/25/2019.   FINDINGS: Segmentation: Standard. Lowest well-formed disc space labeled the L5-S1 level.   Alignment: 6 mm facet mediated anterolisthesis of L4 on L5. Trace 2 mm retrolisthesis of T12 on L1 and L1 and L2.   Vertebrae: Compression deformity of the superior endplate of T11 with superimposed endplate Schmorl's node deformity, partially visualized. There is mild marrow edema at the anterior aspect of the partially visualized superior endplate, which could reflect a superimposed acute to subacute component (series 4, image 8). No retropulsion. Vertebral body height otherwise maintained with no other acute or chronic fracture. Bone marrow signal intensity within normal limits. No worrisome osseous lesions. No other abnormal marrow edema.   Conus medullaris and cauda equina: Conus extends to the T12 level. Conus and cauda equina appear normal.   Paraspinal and other soft tissues: Unremarkable.   Disc levels:   T12-L1: Retrolisthesis with mild disc bulge and endplate spurring. Mild  facet hypertrophy. No canal or foraminal stenosis.   L1-2: Retrolisthesis with mild disc bulge and endplate spurring. Mild facet hypertrophy. No spinal stenosis. Foramina remain patent.   L2-3: Disc desiccation with minimal disc bulge. Mild to moderate facet and ligament flavum hypertrophy. No significant spinal stenosis. Foramina remain patent.   L3-4: Mild degenerative intervertebral disc space narrowing. Disc desiccation with mild disc bulge. Moderate bilateral facet hypertrophy. No significant spinal stenosis. Foramina remain patent.   L4-5: 6 mm facet mediated anterolisthesis. Disc desiccation with associated broad posterior pseudo disc bulge/uncovering. Moderate to advanced bilateral facet arthrosis. Resultant moderate to severe canal with bilateral subarticular stenosis, with mild bilateral L4 foraminal narrowing.   L5-S1: Disc desiccation with mild disc bulge. Superimposed right foraminal disc protrusion with annular fissure contacts the exiting right L5 nerve root (series 8, image 5). Moderate bilateral facet hypertrophy. Resultant mild bilateral subarticular stenosis. Central canal remains patent. Moderate right L5 foraminal narrowing. Left or pharyngeal veins patent.   IMPRESSION: 1. 6 mm facet mediated anterolisthesis of L4 on L5 with resultant moderate to severe canal and bilateral subarticular stenosis, with mild bilateral L4 foraminal narrowing. 2. Right foraminal disc protrusion at L5-S1, contacting and potentially irritating the exiting right L5 nerve root. 3. Additional mild for age spondylosis elsewhere within the lumbar spine as  above. No other significant stenosis or neural impingement. 4. Compression deformity of the superior endplate of T11 with superimposed endplate Schmorl's node deformity, partially visualized. There is mild marrow edema at the anterior aspect of the partially visualized superior endplate, which could reflect a superimposed acute to  subacute fracture component. Correlation with physical exam for possible pain at this location recommended.     Electronically Signed   By: Rise Mu M.D.   On: 04/01/2022 19:49  She reports that she has never smoked. She has never used smokeless tobacco.  Recent Labs    03/06/23 1629  HGBA1C 5.3    Objective:    Physical Exam  Gen: Well-appearing, in no acute distress; non-toxic CV: Well-perfused. Warm.  Resp: Breathing unlabored on room air; no wheezing. Psych: Fluid speech in conversation; appropriate affect; normal thought process  Ortho Exam - Left hip:  - Left calf:   Imaging: XR HIP UNILAT W OR W/O PELVIS 2-3 VIEWS LEFT Result Date: 04/16/2023 2 views of the left hip including AP and lateral film were ordered and reviewed by myself.  There is moderate to severe bilateral hip osteoarthritic change.  The left hip has a degree of subchondral sclerosis inferiorly as well as near the region of the acetabular rim/femoral head juncture.  No acute fracture noted.   Narrative & Impression  CLINICAL DATA:  Left hip pain.  Two falls in the last 3 weeks.   EXAM: CT OF THE LEFT HIP WITHOUT CONTRAST   TECHNIQUE: Multidetector CT imaging of the left hip was performed according to the standard protocol. Multiplanar CT image reconstructions were also generated.   RADIATION DOSE REDUCTION: This exam was performed according to the departmental dose-optimization program which includes automated exposure control, adjustment of the mA and/or kV according to patient size and/or use of iterative reconstruction technique.   COMPARISON:  Radiographs 01/24/2022   FINDINGS: Bones/Joint/Cartilage   Prominent degenerative hip arthropathy with prominent spurring of the acetabulum, femoral head, and femoral neck. Small well corticated ossicle anteromedial to the left femoral neck probably from a chronically fragmented osteophyte. Other chronically fragmented osteophytes are  present along the anterior superior and posteroinferior acetabulum. Prominent degenerative chondral thinning along portions of the hip joint.   No acute fracture is observed.   5 mm of degenerative anterolisthesis of L4 on L5 bilateral facet arthropathy at L4-5, and potential mild central narrowing of the thecal sac at this level. No fracture of the left hemipelvis is observed.   Ligaments   Suboptimally assessed by CT.   Muscles and Tendons   Unremarkable   Soft tissues   Aortoiliac atherosclerotic vascular disease. There are a few proximal sigmoid colon diverticula.   IMPRESSION: 1. No acute fracture is identified. 2. Prominent degenerative hip arthropathy. 3. 5 mm of degenerative anterolisthesis of L4 on L5 with bilateral facet arthropathy at L4-5, and potential mild central narrowing of the thecal sac at this level. 4. Mild sigmoid colon diverticulosis. 5. Aortoiliac atherosclerotic vascular disease.   Aortic Atherosclerosis (ICD10-I70.0).     Electronically Signed   By: Gaylyn Rong M.D.   On: 01/27/2022 13:53    Past Medical/Family/Surgical/Social History: Medications & Allergies reviewed per EMR, new medications updated. Patient Active Problem List   Diagnosis Date Noted   Grade I diastolic dysfunction 04/04/2023   Acute venous embolism and thrombosis of deep vessels of proximal lower extremity (HCC) 12/04/2022   Snoring 11/13/2022   Depression, recurrent (HCC) 09/28/2022   CKD (chronic kidney disease) stage 4,  GFR 15-29 ml/min (HCC) 08/09/2021   Tear film insufficiency 04/19/2021   Sjogren's disease (HCC) 01/24/2021   Abnormal gait 12/10/2020   Hereditary and idiopathic neuropathy, unspecified 12/10/2020   Osteoarthritis 12/10/2020   Esophageal dysphagia 09/16/2020   Dehydration 09/16/2020   AKI (acute kidney injury) (HCC) 09/16/2020   Elevated lipase 09/16/2020   Vitamin D deficiency 10/01/2019   Polypharmacy 10/01/2019   Major depressive  disorder, recurrent episode, moderate (HCC) 10/01/2019   Leg edema 06/04/2019   Chest pain of uncertain etiology 12/25/2018   Hx of pulmonary embolus 03/30/2017   Hyperlipidemia 12/26/2013   Encounter for therapeutic drug monitoring 04/16/2013   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   Diabetes type 2, controlled (HCC) 08/27/2008   ANEMIA-NOS 08/27/2008   Seasonal and perennial allergic rhinitis 08/27/2008   Lung nodule 08/27/2008   Fibromyalgia 08/27/2008   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 (HCC) 03/20/2007   Hypothyroidism 03/20/2007   Essential hypertension 03/20/2007   Recurrent pulmonary emboli (HCC) 03/20/2007   GERD 03/20/2007   ESOPHAGEAL STRICTURE 03/08/2007   Past Medical History:  Diagnosis Date   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) 05/15/2022   Anxiety    Breast cancer (HCC)    Chronic osteoarthritis    CKD stage 3b, GFR 30-44 ml/min (HCC) 01/25/2021   Depression    Diabetes mellitus type II    Diabetes type 2, controlled (HCC) 08/27/2008   Qualifier: Diagnosis of   By: Jonny Ruiz MD, Len Blalock        Essential hypertension 03/20/2007   Qualifier: Diagnosis of   By: Yetta Barre RN, CGRN, Sheri         Exertional dyspnea 11/09/2008   11/20/2017  Walked RA x 3 laps @ 185 ft each stopped due to  End of study, fast pace, no desat, min sob   - Echo 12/05/2017 - LVEF 60-65%, moderate LVH, normal wall motion, grade 1 DD,   indeterminate LV filling pressure, normal GLS at -21%, normal LA   size, trivial TR, RVSP 20 mmHg, normal IVC.           Fibromyalgia    GERD (gastroesophageal reflux disease)    Hyperlipidemia    Hypertension    Hypothyroidism    Malignant neoplasm of breast (female), unspecified site 1993   L breast s/p mastectomy and tamoxifen x 90yrs   Osteoporosis    Other pulmonary embolism and infarction 2008 and 2009   chronic anticoag - [facility]   Family History  Problem Relation Age of Onset   Depression Other         Parent   Arthritis Other        Parent, Grandparent   Hypertension Other        Grandparent   Hyperlipidemia Other        FMH   Miscarriages / Stillbirths Other        Grandparent   Stroke Other        FMH   Cancer Maternal Uncle        prostate   Hypertension Maternal Grandfather    Breast cancer Cousin    Breast cancer Cousin    Past Surgical History:  Procedure Laterality Date   APPENDECTOMY     BALLOON DILATION N/A 10/18/2020   Procedure: BALLOON DILATION;  Surgeon: Lanelle Bal, DO;  Location: [facility];  Service: Endoscopy;  Laterality: N/A;   BIOPSY  10/18/2020   Procedure: BIOPSY;  Surgeon: Marletta Lor,  Hennie Duos, DO;  Location: [facility];  Service: Endoscopy;;  gastric    BREAST BIOPSY Left 1993   BREAST BIOPSY Right 09/25/2014   stero. Benign   BREAST RECONSTRUCTION Left    CATARACT EXTRACTION     ESOPHAGOGASTRODUODENOSCOPY (EGD) WITH PROPOFOL N/A 10/18/2020   Procedure: ESOPHAGOGASTRODUODENOSCOPY (EGD) WITH PROPOFOL;  Surgeon: Lanelle Bal, DO;  Location: [facility];  Service: Endoscopy;  Laterality: N/A;  11:00am   MASTECTOMY Left    ROTATOR CUFF REPAIR     SPINAL FUSION      x 2   TOTAL ABDOMINAL HYSTERECTOMY W/ BILATERAL SALPINGOOPHORECTOMY     TUBAL LIGATION     Social History   Occupational History   Occupation: Software engineer: RETIRED   Occupation: Producer, television/film/video   Occupation: school bus driver  Tobacco Use   Smoking status: Never   Smokeless tobacco: Never  Vaping Use   Vaping status: Never Used  Substance and Sexual Activity   Alcohol use: No    Alcohol/week: 0.0 standard drinks of alcohol   Drug use: No   Sexual activity: Not Currently

## 2023-04-18 ENCOUNTER — Telehealth: Payer: Self-pay | Admitting: Sports Medicine

## 2023-04-18 ENCOUNTER — Telehealth: Payer: Self-pay

## 2023-04-18 NOTE — Telephone Encounter (Signed)
Patient states the pain in her left leg is so severe, she wants to discuss any other options she may have.

## 2023-04-18 NOTE — Telephone Encounter (Signed)
Called and spoke pt concerning overdue coumadin clinic appt. Pt states her Andrea Simmons is now being managed by Haywood Lasso, NP with Family [facility]. Confirmed by notes under labs.

## 2023-04-19 ENCOUNTER — Telehealth: Payer: Self-pay | Admitting: Sports Medicine

## 2023-04-19 ENCOUNTER — Encounter (HOSPITAL_BASED_OUTPATIENT_CLINIC_OR_DEPARTMENT_OTHER): Payer: Medicare PPO | Admitting: Family

## 2023-04-19 ENCOUNTER — Encounter (HOSPITAL_BASED_OUTPATIENT_CLINIC_OR_DEPARTMENT_OTHER): Payer: Self-pay | Admitting: *Deleted

## 2023-04-19 NOTE — Telephone Encounter (Signed)
Pt called stating she can not take the pain another day. She is unable to sleep and asking if Dr Shon Baton can send in some type of pain meds. Pt also stated tylenol is not helping and she will try the patches. Please cal pt about this matter at [phone].

## 2023-04-23 ENCOUNTER — Other Ambulatory Visit (HOSPITAL_BASED_OUTPATIENT_CLINIC_OR_DEPARTMENT_OTHER): Payer: Medicare PPO

## 2023-04-25 ENCOUNTER — Other Ambulatory Visit (HOSPITAL_BASED_OUTPATIENT_CLINIC_OR_DEPARTMENT_OTHER): Payer: Medicare PPO

## 2023-04-25 DIAGNOSIS — Z7901 Long term (current) use of anticoagulants: Secondary | ICD-10-CM

## 2023-04-25 DIAGNOSIS — L570 Actinic keratosis: Secondary | ICD-10-CM | POA: Diagnosis not present

## 2023-04-26 LAB — PROTIME-INR
INR: 2 — ABNORMAL HIGH (ref 0.9–1.2)
Prothrombin Time: 21.1 s — ABNORMAL HIGH (ref 9.1–12.0)

## 2023-04-26 NOTE — Progress Notes (Signed)
 Please let Gwenn know that her INR is now within normal limits.  She should continue on current schedule of Coumadin .  I would recommend a repeat in 1 to 2 weeks.  I placed a referral to the anticoagulation clinic as this would probably be easier for the patient and would not require blood draws and they can keep a better eye on her anticoagulation.  If she is agreeable to this please let me know

## 2023-04-29 ENCOUNTER — Encounter (HOSPITAL_BASED_OUTPATIENT_CLINIC_OR_DEPARTMENT_OTHER): Payer: Self-pay | Admitting: Family Medicine

## 2023-04-30 ENCOUNTER — Other Ambulatory Visit (HOSPITAL_BASED_OUTPATIENT_CLINIC_OR_DEPARTMENT_OTHER): Payer: Self-pay | Admitting: Family Medicine

## 2023-04-30 ENCOUNTER — Encounter (HOSPITAL_BASED_OUTPATIENT_CLINIC_OR_DEPARTMENT_OTHER): Payer: Self-pay | Admitting: Family Medicine

## 2023-04-30 DIAGNOSIS — C44702 Unspecified malignant neoplasm of skin of right lower limb, including hip: Secondary | ICD-10-CM

## 2023-04-30 NOTE — Telephone Encounter (Signed)
 Andrea Simmons, please see mychart message sent by pt's daughter and advise.

## 2023-05-07 ENCOUNTER — Other Ambulatory Visit (INDEPENDENT_AMBULATORY_CARE_PROVIDER_SITE_OTHER): Payer: Medicare PPO

## 2023-05-07 ENCOUNTER — Other Ambulatory Visit (HOSPITAL_BASED_OUTPATIENT_CLINIC_OR_DEPARTMENT_OTHER): Payer: Self-pay | Admitting: *Deleted

## 2023-05-07 DIAGNOSIS — Z7901 Long term (current) use of anticoagulants: Secondary | ICD-10-CM

## 2023-05-07 DIAGNOSIS — Z86718 Personal history of other venous thrombosis and embolism: Secondary | ICD-10-CM

## 2023-05-07 DIAGNOSIS — I2699 Other pulmonary embolism without acute cor pulmonale: Secondary | ICD-10-CM

## 2023-05-08 ENCOUNTER — Other Ambulatory Visit (HOSPITAL_BASED_OUTPATIENT_CLINIC_OR_DEPARTMENT_OTHER): Payer: Medicare PPO

## 2023-05-08 DIAGNOSIS — Z7901 Long term (current) use of anticoagulants: Secondary | ICD-10-CM | POA: Diagnosis not present

## 2023-05-09 ENCOUNTER — Ambulatory Visit: Payer: Medicare PPO | Admitting: Nurse Practitioner

## 2023-05-09 LAB — PROTIME-INR
INR: 2 — ABNORMAL HIGH (ref 0.9–1.2)
Prothrombin Time: 20.9 s — ABNORMAL HIGH (ref 9.1–12.0)

## 2023-05-09 NOTE — Progress Notes (Signed)
Please let Andrea Simmons know that her INR is within normal limits and she can continue on current dosing of her Coumadin.  She will need a recheck INR in 2 weeks.  Once she has had consecutive in range INRs for 3 to 4 weeks, we can increase her to the interval of 3 to 4 weeks.  If she would like referral for anticoagulation clinic, I be happy to provide.

## 2023-05-15 DIAGNOSIS — N1832 Chronic kidney disease, stage 3b: Secondary | ICD-10-CM | POA: Diagnosis not present

## 2023-05-17 ENCOUNTER — Telehealth: Payer: Self-pay | Admitting: Dermatology

## 2023-05-20 ENCOUNTER — Other Ambulatory Visit: Payer: Self-pay | Admitting: Cardiology

## 2023-05-20 ENCOUNTER — Other Ambulatory Visit: Payer: Self-pay | Admitting: Psychiatry

## 2023-05-20 ENCOUNTER — Other Ambulatory Visit (HOSPITAL_BASED_OUTPATIENT_CLINIC_OR_DEPARTMENT_OTHER): Payer: Self-pay | Admitting: Family Medicine

## 2023-05-20 DIAGNOSIS — I10 Essential (primary) hypertension: Secondary | ICD-10-CM

## 2023-05-20 DIAGNOSIS — R600 Localized edema: Secondary | ICD-10-CM

## 2023-05-20 DIAGNOSIS — N184 Chronic kidney disease, stage 4 (severe): Secondary | ICD-10-CM

## 2023-05-20 DIAGNOSIS — E782 Mixed hyperlipidemia: Secondary | ICD-10-CM

## 2023-05-20 DIAGNOSIS — M797 Fibromyalgia: Secondary | ICD-10-CM

## 2023-05-21 ENCOUNTER — Other Ambulatory Visit (HOSPITAL_BASED_OUTPATIENT_CLINIC_OR_DEPARTMENT_OTHER): Payer: Medicare PPO

## 2023-05-21 ENCOUNTER — Other Ambulatory Visit (HOSPITAL_BASED_OUTPATIENT_CLINIC_OR_DEPARTMENT_OTHER): Payer: Self-pay | Admitting: *Deleted

## 2023-05-21 DIAGNOSIS — N1831 Chronic kidney disease, stage 3a: Secondary | ICD-10-CM | POA: Diagnosis not present

## 2023-05-21 DIAGNOSIS — D631 Anemia in chronic kidney disease: Secondary | ICD-10-CM | POA: Diagnosis not present

## 2023-05-21 DIAGNOSIS — Z7901 Long term (current) use of anticoagulants: Secondary | ICD-10-CM

## 2023-05-21 DIAGNOSIS — E1122 Type 2 diabetes mellitus with diabetic chronic kidney disease: Secondary | ICD-10-CM | POA: Diagnosis not present

## 2023-05-21 DIAGNOSIS — I129 Hypertensive chronic kidney disease with stage 1 through stage 4 chronic kidney disease, or unspecified chronic kidney disease: Secondary | ICD-10-CM | POA: Diagnosis not present

## 2023-05-22 LAB — PROTIME-INR
INR: 2.6 — ABNORMAL HIGH (ref 0.9–1.2)
Prothrombin Time: 27.2 s — ABNORMAL HIGH (ref 9.1–12.0)

## 2023-05-23 NOTE — Progress Notes (Signed)
 Please have patient continue on current dosage and repeat INR in 2 weeks.

## 2023-05-25 ENCOUNTER — Other Ambulatory Visit: Payer: Self-pay | Admitting: Psychiatry

## 2023-05-25 ENCOUNTER — Other Ambulatory Visit: Payer: Self-pay | Admitting: Cardiology

## 2023-05-25 DIAGNOSIS — M797 Fibromyalgia: Secondary | ICD-10-CM

## 2023-05-25 DIAGNOSIS — I10 Essential (primary) hypertension: Secondary | ICD-10-CM

## 2023-05-29 ENCOUNTER — Ambulatory Visit: Payer: Medicare PPO | Admitting: Licensed Clinical Social Worker

## 2023-05-29 DIAGNOSIS — F4323 Adjustment disorder with mixed anxiety and depressed mood: Secondary | ICD-10-CM

## 2023-05-29 NOTE — Progress Notes (Signed)
 [facility] Counselor Initial Adult Exam  Name: Andrea Simmons Date: 05/29/2023 MRN: [id number] DOB: 05/28/1940 PCP: Hilbert Bible, FNP  Time Spent: 11:05  am - 12:04 pm : 59 Minutes  Guardian/Payee:  Self/Adult    Paperwork requested: No   Reason for Visit /Presenting Problem: sleep issues and pain from fibromyalgia   Mental Status Exam: Appearance:   Well Groomed     Behavior:  Sharing  Motor:  Normal  Speech/Language:   Normal Rate  Affect:  Appropriate  Mood:  normal  Thought process:  normal  Thought content:    WNL  Sensory/Perceptual disturbances:    WNL  Orientation:  oriented to person, place, and time/date  Attention:  Good  Concentration:  Good  Memory:  WNL  Fund of knowledge:   Good  Insight:    Good  Judgment:   Good  Impulse Control:  Good   Reported Symptoms:  brain fog, memory issues, health issues, not feeling like she is supporting her disabled husband-guilt, tired, fatigued  Risk Assessment: Danger to Self:  No Self-injurious Behavior: No Danger to Others: No Duty to Warn:no Physical Aggression / Violence:No  Access to Firearms a concern: No  Gang Involvement:No  Patient / guardian was educated about steps to take if suicide or homicide risk level increases between visits: yes While future psychiatric events cannot be accurately predicted, the patient does not currently require acute inpatient psychiatric care and does not currently meet [facility] involuntary commitment criteria.  Substance Abuse History: Current substance abuse: No     Caffeine: Tobacco: Alcohol: Substance use:  Past Psychiatric History:   No previous psychological problems have been observed Outpatient Providers:N/A History of Psych Hospitalization: No  Psychological Testing:  N/A    Abuse History:  Victim of: No.,  N/A    Report needed: No. Victim of Neglect:No. Perpetrator of  N/A   Witness / Exposure to Domestic Violence: No    Protective Services Involvement: No  Witness to [employer] Violence:  No   Family History:  Family History  Problem Relation Age of Onset   Depression Other        Parent   Arthritis Other        Parent, Grandparent   Hypertension Other        Grandparent   Hyperlipidemia Other        FMH   Miscarriages / Stillbirths Other        Grandparent   Stroke Other        FMH   Cancer Maternal Uncle        prostate   Hypertension Maternal Grandfather    Breast cancer Cousin    Breast cancer Cousin     Living situation: the patient lives alone  Sexual Orientation: Straight  Relationship Status: married  Name of spouse / other:[place]  If a parent, number of children / ages:1 daughter   Support Systems: significant other Friends  Surveyor, quantity Stress:  Yes   Income/Employment/Disability: Doctor, general practice: No   Educational History: Education: Water quality scientist: [employer]  Any cultural differences that may affect / interfere with treatment:  not applicable   Recreation/Hobbies: None now  Stressors: Financial difficulties   Health problems   Medication change or noncompliance    Strengths: Supportive Relationships, Family, Friends, Spirituality, and Hopefulness  Barriers:  Banker History: Pending legal issue / charges: The patient has no significant history of legal issues. 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) 05/15/2022   Anxiety    Breast cancer (HCC)    Chronic osteoarthritis    CKD stage 3b, GFR 30-44 ml/min (HCC) 01/25/2021   Depression    Diabetes mellitus type II    Diabetes type 2, controlled (HCC) 08/27/2008   Qualifier: Diagnosis of   By: Jonny Ruiz MD, Len Blalock        Essential hypertension 03/20/2007   Qualifier: Diagnosis of   By: Yetta Barre  RN, CGRN, Sheri         Exertional dyspnea 11/09/2008   11/20/2017  Walked RA x 3 laps @ 185 ft each stopped due to  End of study, fast pace, no desat, min sob   - Echo 12/05/2017 - LVEF 60-65%, moderate LVH, normal wall motion, grade 1 DD,   indeterminate LV filling pressure, normal GLS at -21%, normal LA   size, trivial TR, RVSP 20 mmHg, normal IVC.           Fibromyalgia    GERD (gastroesophageal reflux disease)    Hyperlipidemia    Hypertension    Hypothyroidism    Malignant neoplasm of breast (female), unspecified site 1993   L breast s/p mastectomy and tamoxifen x 99yrs   Osteoporosis    Other pulmonary embolism and infarction 2008 and 2009   chronic anticoag - [facility]    Past Surgical History:  Procedure Laterality Date   APPENDECTOMY     BALLOON DILATION N/A 10/18/2020   Procedure: BALLOON DILATION;  Surgeon: Lanelle Bal, DO;  Location: [facility];  Service: Endoscopy;  Laterality: N/A;   BIOPSY  10/18/2020   Procedure: BIOPSY;  Surgeon: Lanelle Bal, DO;  Location: [facility];  Service: Endoscopy;;  gastric    BREAST BIOPSY Left 1993   BREAST BIOPSY Right 09/25/2014   stero. Benign   BREAST RECONSTRUCTION Left    CATARACT EXTRACTION     ESOPHAGOGASTRODUODENOSCOPY (EGD) WITH PROPOFOL N/A 10/18/2020   Procedure: ESOPHAGOGASTRODUODENOSCOPY (EGD) WITH PROPOFOL;  Surgeon: Lanelle Bal, DO;  Location: [facility];  Service: Endoscopy;  Laterality: N/A;  11:00am   MASTECTOMY Left    ROTATOR CUFF REPAIR     SPINAL FUSION      x 2   TOTAL ABDOMINAL HYSTERECTOMY W/ BILATERAL SALPINGOOPHORECTOMY     TUBAL LIGATION      Medications: Current Outpatient Medications  Medication Sig Dispense Refill   ACCU-CHEK GUIDE test strip CHECK BLOOD SUGARS DAILY 100 strip 12   Accu-Chek Softclix Lancets lancets USE AS INSTRUCTED TO TEST SUGARS TWICE A DAY 100 each 12   amLODipine (NORVASC) 10 MG tablet TAKE 1 TABLET BY MOUTH EVERY DAY 90 tablet 2   Biotin 1000 MCG tablet Take  1,000 mcg by mouth daily.     Blood Glucose Monitoring Suppl (ACCU-CHEK GUIDE) w/Device KIT 1 each by Does not apply route 2 (two) times daily. To test sugars. Dx. E11.9 1 kit 1   DULoxetine (CYMBALTA) 30 MG capsule Take 3 capsules (90 mg total) by mouth daily. 270 capsule 3   eszopiclone (LUNESTA) 2 MG TABS tablet Take 1 tablet (2 mg total) by mouth at bedtime as needed for sleep. Take immediately before bedtime 30 tablet 5   FARXIGA 5 MG TABS tablet TAKE 1 TABLET (5 MG TOTAL) BY MOUTH DAILY. 30 tablet 2   fenofibrate (  TRICOR) 48 MG tablet Take 48 mg by mouth daily.     FLAREX 0.1 % ophthalmic suspension Apply to eye.     furosemide (LASIX) 20 MG tablet TAKE 1 TABLET BY MOUTH EVERY DAY AS NEEDED 90 tablet 2   hydrALAZINE (APRESOLINE) 100 MG tablet Take 0.5 tablets (50 mg total) by mouth 2 (two) times daily. 90 tablet 0   labetalol (NORMODYNE) 200 MG tablet TAKE 1 TABLET BY MOUTH TWICE A DAY 180 tablet 2   levothyroxine (SYNTHROID) 25 MCG tablet TAKE 1 TABLET BY MOUTH EVERY DAY BEFORE BREAKFAST 90 tablet 3   Magnesium 500 MG TABS Take 500 mg by mouth daily.     rosuvastatin (CRESTOR) 10 MG tablet TAKE 1 TABLET BY MOUTH EVERY DAY 90 tablet 1   valsartan (DIOVAN) 320 MG tablet TAKE 1 TABLET BY MOUTH EVERY DAY 90 tablet 2   warfarin (COUMADIN) 5 MG tablet TAKE 1/2 TABLET (2.5 MG) BY MOUTH DAILY EXCEPT TAKE 1 TABLET (5 MG) ON MONDAYS, WEDNESDAYS AND SATURDAYS OR AS DIRECTED BY ANTICOAGULATION CLINIC 75 tablet 2   No current facility-administered medications for this visit.    Allergies  Allergen Reactions   Lipitor [Atorvastatin]     Unknown   Morphine Nausea And Vomiting   Meperidine Nausea And Vomiting   Naproxen Sodium Nausea And Vomiting    Diagnoses:  Adj with mixed anx/dep  Psychiatric Treatment: No , N/A  Plan of Care: in person  Narrative:  Larence Penning participated from office with therapist and consented to treatment. We reviewed the limits of confidentiality prior to  the start of the evaluation. Solara Goodchild Fitzner expressed understanding and agreement to proceed. Husband fell and is now in [place] care and out of the home.  She is adjusting to doing things without him and struggling with health issues that keep her from being able to see him every day.      A follow-up was scheduled to create a treatment plan and begin treatment. Therapist answered  and all questions during the evaluation and contact information was provided.    Anselmo Pickler, [facility]

## 2023-05-31 DIAGNOSIS — E1165 Type 2 diabetes mellitus with hyperglycemia: Secondary | ICD-10-CM | POA: Diagnosis not present

## 2023-05-31 DIAGNOSIS — E7800 Pure hypercholesterolemia, unspecified: Secondary | ICD-10-CM | POA: Diagnosis not present

## 2023-05-31 DIAGNOSIS — E039 Hypothyroidism, unspecified: Secondary | ICD-10-CM | POA: Diagnosis not present

## 2023-06-05 ENCOUNTER — Other Ambulatory Visit (HOSPITAL_BASED_OUTPATIENT_CLINIC_OR_DEPARTMENT_OTHER): Payer: Self-pay | Admitting: Family Medicine

## 2023-06-05 ENCOUNTER — Other Ambulatory Visit (HOSPITAL_BASED_OUTPATIENT_CLINIC_OR_DEPARTMENT_OTHER)

## 2023-06-05 ENCOUNTER — Ambulatory Visit
Admission: RE | Admit: 2023-06-05 | Discharge: 2023-06-05 | Disposition: A | Discharge status: Home / Self Care | Payer: Medicare PPO | Source: Ambulatory Visit | Attending: Family Medicine | Admitting: Family Medicine

## 2023-06-05 DIAGNOSIS — I2699 Other pulmonary embolism without acute cor pulmonale: Secondary | ICD-10-CM | POA: Diagnosis not present

## 2023-06-05 DIAGNOSIS — Z1231 Encounter for screening mammogram for malignant neoplasm of breast: Secondary | ICD-10-CM | POA: Diagnosis not present

## 2023-06-05 DIAGNOSIS — Z86718 Personal history of other venous thrombosis and embolism: Secondary | ICD-10-CM | POA: Diagnosis not present

## 2023-06-06 ENCOUNTER — Other Ambulatory Visit (HOSPITAL_BASED_OUTPATIENT_CLINIC_OR_DEPARTMENT_OTHER): Payer: Self-pay | Admitting: Family Medicine

## 2023-06-06 DIAGNOSIS — Z7901 Long term (current) use of anticoagulants: Secondary | ICD-10-CM

## 2023-06-06 LAB — PROTIME-INR
INR: 2.1 — ABNORMAL HIGH (ref 0.9–1.2)
Prothrombin Time: 22.3 s — ABNORMAL HIGH (ref 9.1–12.0)

## 2023-06-06 NOTE — Progress Notes (Signed)
 Patient's INR is stable at 2.1.  Given 3 to 4 weeks of stable INR values, we can increase her interval to a recheck in 3 weeks.  Please schedule for a lab visit.

## 2023-06-07 ENCOUNTER — Encounter (HOSPITAL_BASED_OUTPATIENT_CLINIC_OR_DEPARTMENT_OTHER): Payer: Self-pay | Admitting: Family Medicine

## 2023-06-07 NOTE — Progress Notes (Signed)
 Hi Andrea Simmons,  Your mammogram is normal. Based upon guidelines, we can stop mammogram screenings if you would like at your age. However, if you would like to continue yearly screenings, we can continue these annually. We will discuss at your next visit your decision going forward.

## 2023-06-08 ENCOUNTER — Telehealth: Payer: Self-pay

## 2023-06-08 NOTE — Telephone Encounter (Signed)
 Can reschedule until after she gets CPAP. Thanks.

## 2023-06-08 NOTE — Telephone Encounter (Signed)
 Patient has not gotten CPAP machine due to family issues. She is stating that she still wants to keep her appointment with you for Monday 06/11/2023.

## 2023-06-11 ENCOUNTER — Ambulatory Visit: Payer: Medicare PPO | Admitting: Nurse Practitioner

## 2023-06-12 ENCOUNTER — Ambulatory Visit: Admitting: Licensed Clinical Social Worker

## 2023-06-14 NOTE — Telephone Encounter (Signed)
 Pending appt 07/23/2023.

## 2023-06-18 ENCOUNTER — Other Ambulatory Visit (HOSPITAL_BASED_OUTPATIENT_CLINIC_OR_DEPARTMENT_OTHER): Payer: Self-pay | Admitting: Family Medicine

## 2023-06-18 ENCOUNTER — Encounter (HOSPITAL_BASED_OUTPATIENT_CLINIC_OR_DEPARTMENT_OTHER): Payer: Self-pay | Admitting: *Deleted

## 2023-06-18 DIAGNOSIS — N184 Chronic kidney disease, stage 4 (severe): Secondary | ICD-10-CM

## 2023-06-18 DIAGNOSIS — E039 Hypothyroidism, unspecified: Secondary | ICD-10-CM

## 2023-06-18 DIAGNOSIS — Z7901 Long term (current) use of anticoagulants: Secondary | ICD-10-CM

## 2023-06-18 DIAGNOSIS — E782 Mixed hyperlipidemia: Secondary | ICD-10-CM

## 2023-06-19 ENCOUNTER — Encounter (HOSPITAL_BASED_OUTPATIENT_CLINIC_OR_DEPARTMENT_OTHER): Payer: Self-pay

## 2023-06-21 ENCOUNTER — Encounter (HOSPITAL_BASED_OUTPATIENT_CLINIC_OR_DEPARTMENT_OTHER): Payer: Medicare PPO | Admitting: Family

## 2023-06-24 ENCOUNTER — Encounter (HOSPITAL_BASED_OUTPATIENT_CLINIC_OR_DEPARTMENT_OTHER): Payer: Self-pay | Admitting: Emergency Medicine

## 2023-06-24 ENCOUNTER — Emergency Department (HOSPITAL_BASED_OUTPATIENT_CLINIC_OR_DEPARTMENT_OTHER)

## 2023-06-24 ENCOUNTER — Emergency Department (HOSPITAL_BASED_OUTPATIENT_CLINIC_OR_DEPARTMENT_OTHER)
Admission: EM | Admit: 2023-06-24 | Discharge: 2023-06-25 | Disposition: A | Discharge status: Home / Self Care | Attending: Emergency Medicine | Admitting: Emergency Medicine

## 2023-06-24 DIAGNOSIS — M19072 Primary osteoarthritis, left ankle and foot: Secondary | ICD-10-CM | POA: Diagnosis not present

## 2023-06-24 DIAGNOSIS — N1832 Chronic kidney disease, stage 3b: Secondary | ICD-10-CM | POA: Insufficient documentation

## 2023-06-24 DIAGNOSIS — E039 Hypothyroidism, unspecified: Secondary | ICD-10-CM | POA: Diagnosis not present

## 2023-06-24 DIAGNOSIS — M1811 Unilateral primary osteoarthritis of first carpometacarpal joint, right hand: Secondary | ICD-10-CM | POA: Diagnosis not present

## 2023-06-24 DIAGNOSIS — S99922A Unspecified injury of left foot, initial encounter: Secondary | ICD-10-CM | POA: Diagnosis present

## 2023-06-24 DIAGNOSIS — I129 Hypertensive chronic kidney disease with stage 1 through stage 4 chronic kidney disease, or unspecified chronic kidney disease: Secondary | ICD-10-CM | POA: Diagnosis not present

## 2023-06-24 DIAGNOSIS — Z79899 Other long term (current) drug therapy: Secondary | ICD-10-CM | POA: Diagnosis not present

## 2023-06-24 DIAGNOSIS — Y92009 Unspecified place in unspecified non-institutional (private) residence as the place of occurrence of the external cause: Secondary | ICD-10-CM | POA: Insufficient documentation

## 2023-06-24 DIAGNOSIS — M19012 Primary osteoarthritis, left shoulder: Secondary | ICD-10-CM | POA: Diagnosis not present

## 2023-06-24 DIAGNOSIS — Z853 Personal history of malignant neoplasm of breast: Secondary | ICD-10-CM | POA: Insufficient documentation

## 2023-06-24 DIAGNOSIS — Z7984 Long term (current) use of oral hypoglycemic drugs: Secondary | ICD-10-CM | POA: Diagnosis not present

## 2023-06-24 DIAGNOSIS — M25512 Pain in left shoulder: Secondary | ICD-10-CM | POA: Diagnosis not present

## 2023-06-24 DIAGNOSIS — S92155A Nondisplaced avulsion fracture (chip fracture) of left talus, initial encounter for closed fracture: Secondary | ICD-10-CM

## 2023-06-24 DIAGNOSIS — W01198A Fall on same level from slipping, tripping and stumbling with subsequent striking against other object, initial encounter: Secondary | ICD-10-CM | POA: Diagnosis not present

## 2023-06-24 DIAGNOSIS — M7732 Calcaneal spur, left foot: Secondary | ICD-10-CM | POA: Diagnosis not present

## 2023-06-24 DIAGNOSIS — M25531 Pain in right wrist: Secondary | ICD-10-CM

## 2023-06-24 DIAGNOSIS — S0990XA Unspecified injury of head, initial encounter: Secondary | ICD-10-CM

## 2023-06-24 DIAGNOSIS — M25572 Pain in left ankle and joints of left foot: Secondary | ICD-10-CM | POA: Insufficient documentation

## 2023-06-24 DIAGNOSIS — G9389 Other specified disorders of brain: Secondary | ICD-10-CM | POA: Diagnosis not present

## 2023-06-24 DIAGNOSIS — S4992XA Unspecified injury of left shoulder and upper arm, initial encounter: Secondary | ICD-10-CM | POA: Diagnosis not present

## 2023-06-24 DIAGNOSIS — W19XXXA Unspecified fall, initial encounter: Secondary | ICD-10-CM

## 2023-06-24 DIAGNOSIS — M25511 Pain in right shoulder: Secondary | ICD-10-CM | POA: Diagnosis not present

## 2023-06-24 DIAGNOSIS — E119 Type 2 diabetes mellitus without complications: Secondary | ICD-10-CM | POA: Insufficient documentation

## 2023-06-24 DIAGNOSIS — Z7901 Long term (current) use of anticoagulants: Secondary | ICD-10-CM | POA: Insufficient documentation

## 2023-06-24 DIAGNOSIS — M79672 Pain in left foot: Secondary | ICD-10-CM | POA: Diagnosis not present

## 2023-06-24 LAB — CBC WITH DIFFERENTIAL/PLATELET
Abs Immature Granulocytes: 0.03 10*3/uL (ref 0.00–0.07)
Basophils Absolute: 0 10*3/uL (ref 0.0–0.1)
Basophils Relative: 0 %
Eosinophils Absolute: 0.1 10*3/uL (ref 0.0–0.5)
Eosinophils Relative: 1 %
HCT: 32.9 % — ABNORMAL LOW (ref 36.0–46.0)
Hemoglobin: 11.3 g/dL — ABNORMAL LOW (ref 12.0–15.0)
Immature Granulocytes: 0 %
Lymphocytes Relative: 14 %
Lymphs Abs: 1.4 10*3/uL (ref 0.7–4.0)
MCH: 31.6 pg (ref 26.0–34.0)
MCHC: 34.3 g/dL (ref 30.0–36.0)
MCV: 91.9 fL (ref 80.0–100.0)
Monocytes Absolute: 1 10*3/uL (ref 0.1–1.0)
Monocytes Relative: 11 %
Neutro Abs: 7.1 10*3/uL (ref 1.7–7.7)
Neutrophils Relative %: 74 %
Platelets: 188 10*3/uL (ref 150–400)
RBC: 3.58 MIL/uL — ABNORMAL LOW (ref 3.87–5.11)
RDW: 12.1 % (ref 11.5–15.5)
WBC: 9.6 10*3/uL (ref 4.0–10.5)
nRBC: 0 % (ref 0.0–0.2)

## 2023-06-24 LAB — PROTIME-INR
INR: 2.6 — ABNORMAL HIGH (ref 0.8–1.2)
Prothrombin Time: 28.3 s — ABNORMAL HIGH (ref 11.4–15.2)

## 2023-06-24 LAB — BASIC METABOLIC PANEL WITH GFR
Anion gap: 10 (ref 5–15)
BUN: 38 mg/dL — ABNORMAL HIGH (ref 8–23)
CO2: 24 mmol/L (ref 22–32)
Calcium: 9.3 mg/dL (ref 8.9–10.3)
Chloride: 105 mmol/L (ref 98–111)
Creatinine, Ser: 1.3 mg/dL — ABNORMAL HIGH (ref 0.44–1.00)
GFR, Estimated: 41 mL/min — ABNORMAL LOW (ref >60)
Glucose, Bld: 101 mg/dL — ABNORMAL HIGH (ref 70–99)
Potassium: 3.5 mmol/L (ref 3.5–5.1)
Sodium: 139 mmol/L (ref 135–145)

## 2023-06-24 NOTE — ED Provider Notes (Signed)
 [place] EMERGENCY DEPARTMENT AT [facility] Provider Note   CSN: [id number] Arrival date & time: 06/24/23  2131     History  Chief Complaint  Patient presents with   Andrea Simmons is a 83 y.o. female.  Patient with a fall at home she slipped and stumbled at around 1900.  Patient with complaint of pain to left ankle and foot left shoulder right wrist does not think she hit her head but she is not certain.  She is on Coumadin.  Past medical history stinger hypothyroidism fibromyalgia hyperlipidemia hypertension diabetes malignant neoplasm of breast in 1993.  Chronic kidney disease stage IIIb.  Patient with a history of pulmonary embolism and infarctions in 2008 2009 chronically anticoagulated.  Past surgical history sniffer appendectomy total abdominal hysterectomy.  Patient is never used tobacco products.       Home Medications Prior to Admission medications   Medication Sig Start Date End Date Taking? Authorizing Provider  ACCU-CHEK GUIDE test strip CHECK BLOOD SUGARS DAILY 12/19/21   Sheliah Hatch, MD  Accu-Chek Softclix Lancets lancets USE AS INSTRUCTED TO TEST SUGARS TWICE A DAY 12/13/22   Sheliah Hatch, MD  amLODipine (NORVASC) 10 MG tablet TAKE 1 TABLET BY MOUTH EVERY DAY 05/22/23   [name], Anabel Bene, MD  Biotin 1000 MCG tablet Take 1,000 mcg by mouth daily.    [provider]  Blood Glucose Monitoring Suppl (ACCU-CHEK GUIDE) w/Device KIT 1 each by Does not apply route 2 (two) times daily. To test sugars. Dx. E11.9 06/23/19   Sheliah Hatch, MD  DULoxetine (CYMBALTA) 30 MG capsule Take 3 capsules (90 mg total) by mouth daily. 01/17/23   [name], Steva Ready., MD  eszopiclone (LUNESTA) 2 MG TABS tablet Take 1 tablet (2 mg total) by mouth at bedtime as needed for sleep. Take immediately before bedtime 01/17/23   [name], Steva Ready., MD  FARXIGA 5 MG TABS tablet TAKE 1 TABLET (5 MG TOTAL) BY MOUTH DAILY. 05/21/23   Caudle, Shelton Silvas,  FNP  fenofibrate (TRICOR) 48 MG tablet Take 48 mg by mouth daily. 01/06/21   [provider]  FLAREX 0.1 % ophthalmic suspension Apply to eye. 02/09/21   [provider]  furosemide (LASIX) 20 MG tablet TAKE 1 TABLET BY MOUTH EVERY DAY AS NEEDED 05/22/23   [name], [name], MD  hydrALAZINE (APRESOLINE) 100 MG tablet Take 0.5 tablets (50 mg total) by mouth 2 (two) times daily. 03/06/23   Alyson Reedy, FNP  labetalol (NORMODYNE) 200 MG tablet TAKE 1 TABLET BY MOUTH TWICE A DAY 05/22/23   [name], [name], MD  levothyroxine (SYNTHROID) 25 MCG tablet TAKE 1 TABLET BY MOUTH EVERY DAY BEFORE BREAKFAST 04/11/23   Caudle, Shelton Silvas, FNP  Magnesium 500 MG TABS Take 500 mg by mouth daily.    [provider]  rosuvastatin (CRESTOR) 10 MG tablet TAKE 1 TABLET BY MOUTH EVERY DAY 05/21/23   Caudle, Shelton Silvas, FNP  valsartan (DIOVAN) 320 MG tablet TAKE 1 TABLET BY MOUTH EVERY DAY 08/31/22   [name], [name], MD  warfarin (COUMADIN) 5 MG tablet TAKE 1/2 TABLET (2.5 MG) BY MOUTH DAILY EXCEPT TAKE 1 TABLET (5 MG) ON MONDAYS, WEDNESDAYS AND SATURDAYS OR AS DIRECTED BY ANTICOAGULATION CLINIC 03/06/23   Alyson Reedy, FNP      Allergies    Lipitor [atorvastatin], Morphine, Meperidine, and Naproxen sodium    Review of Systems   Review of Systems  Constitutional:  Negative  for chills and fever.  HENT:  Negative for ear pain and sore throat.   Eyes:  Negative for pain and visual disturbance.  Respiratory:  Negative for cough and shortness of breath.   Cardiovascular:  Negative for chest pain and palpitations.  Gastrointestinal:  Negative for abdominal pain and vomiting.  Genitourinary:  Negative for dysuria and hematuria.  Musculoskeletal:  Positive for joint swelling. Negative for arthralgias and back pain.  Skin:  Negative for color change and rash.  Neurological:  Negative for seizures and syncope.  All other systems reviewed and are negative.   Physical  Exam Updated Vital Signs BP (!) 187/78   Pulse 65   Temp 98 F (36.7 C) (Oral)   Resp 20   SpO2 99%  Physical Exam Vitals and nursing note reviewed.  Constitutional:      General: She is not in acute distress.    Appearance: Normal appearance. She is well-developed.  HENT:     Head: Normocephalic and atraumatic.  Eyes:     Extraocular Movements: Extraocular movements intact.     Conjunctiva/sclera: Conjunctivae normal.     Pupils: Pupils are equal, round, and reactive to light.  Cardiovascular:     Rate and Rhythm: Normal rate and regular rhythm.     Heart sounds: No murmur heard. Pulmonary:     Effort: Pulmonary effort is normal. No respiratory distress.     Breath sounds: Normal breath sounds.  Abdominal:     Palpations: Abdomen is soft.     Tenderness: There is no abdominal tenderness.  Musculoskeletal:        General: Swelling, tenderness and signs of injury present.     Cervical back: Normal range of motion and neck supple. No rigidity or tenderness.     Comments: Right wrist with some tenderness but no snuffbox tenderness.  Left shoulder with some pain with range of motion.  No obvious deformity.  Radial pulse distally is 2+.  Sensation intact to the fingers on the left side.  Left ankle and foot with a lot of swelling and some discoloration.  Dorsalis pedis pulses 1+ cap refill is normal sensation intact.  No proximal leg tenderness on the left side.  Skin:    General: Skin is warm and dry.     Capillary Refill: Capillary refill takes less than 2 seconds.  Neurological:     General: No focal deficit present.     Mental Status: She is alert and oriented to person, place, and time.  Psychiatric:        Mood and Affect: Mood normal.     ED Results / Procedures / Treatments   Labs (all labs ordered are listed, but only abnormal results are displayed) Labs Reviewed  PROTIME-INR  CBC WITH DIFFERENTIAL/PLATELET  BASIC METABOLIC PANEL WITH GFR     EKG None  Radiology No results found.  Procedures Procedures    Medications Ordered in ED Medications - No data to display  ED Course/ Medical Decision Making/ A&P                                 Medical Decision Making Amount and/or Complexity of Data Reviewed Labs: ordered. Radiology: ordered.   Patient with a fall inside the house no skin tears or wounds.  But complained of pain to the left shoulder.  Also swelling and obvious injury to the left foot and ankle.  Pain to the right  wrist area without snuffbox tenderness.  And patient will need CT of head.  Have ordered x-rays of those other areas.  Have ordered basic labs CBC basic metabolic panel and INR to make sure that her Coumadin level is appropriate.  Clinically the most concerning things on exam would really be the swelling to the left foot and ankle.  Patient has been able to ambulate with a cane at home and she does have a walker at home.     Final Clinical Impression(s) / ED Diagnoses Final diagnoses:  Fall, initial encounter  Injury of head, initial encounter  Injury of left shoulder, initial encounter  Right wrist pain    Rx / DC Orders ED Discharge Orders     None         Vanetta Mulders, MD 06/24/23 2251

## 2023-06-24 NOTE — ED Triage Notes (Signed)
 Fall, trip over threshold. Around 7pm Pain in left ankle right wrist and thumb, left shoulder. Unsure if she hit head no loc On coumadin

## 2023-06-25 NOTE — Discharge Instructions (Signed)
 Ice your foot for 20 minutes every 2 hours while awake for the next 2 days.  Take Tylenol 650 mg every 6 hours as needed for pain.  Elevate your foot is much as possible for the next several days.  Wear cam walker for comfort and support.  Follow-up with primary doctor if symptoms are not improving in the next 1 to 2 weeks.

## 2023-06-25 NOTE — ED Provider Notes (Signed)
  Physical Exam  BP (!) 187/78   Pulse 71   Temp 98 F (36.7 C) (Oral)   Resp 20   SpO2 99%   Physical Exam Vitals and nursing note reviewed.  Constitutional:      Appearance: Normal appearance.  Pulmonary:     Effort: Pulmonary effort is normal.  Musculoskeletal:     Comments: There is swelling and tenderness to palpation over the dorsal aspect of the left foot.  Skin:    General: Skin is warm and dry.  Neurological:     Mental Status: She is alert.     Procedures  Procedures  ED Course / MDM    Medical Decision Making Amount and/or Complexity of Data Reviewed Labs: ordered. Radiology: ordered.    Care assumed from Dr. Deretha Emory at shift change.  Patient on Coumadin and presenting after a fall.  Care signed out to me awaiting results of CT of the head and x-rays of the left shoulder, left foot, and left ankle.  Studies all unremarkable with the exception of a possible avulsion fracture of the left talus.  This does correlate with the tender area on her foot and I suspect is real.  Patient will be placed in a cam walker, advised to ice, elevate, and follow-up with primary doctor as      Geoffery Lyons, MD 06/25/23 0003

## 2023-06-26 ENCOUNTER — Telehealth: Payer: Self-pay

## 2023-06-26 DIAGNOSIS — S92155A Nondisplaced avulsion fracture (chip fracture) of left talus, initial encounter for closed fracture: Secondary | ICD-10-CM | POA: Diagnosis not present

## 2023-06-26 NOTE — Transitions of Care (Post Inpatient/ED Visit) (Signed)
   06/26/2023  Name: Andrea Simmons MRN: [id number] DOB: [birth year]  Today's TOC FU Call Status: Today's TOC FU Call Status:: Successful TOC FU Call Completed TOC FU Call Complete Date: 06/26/23 Patient's Name and Date of Birth confirmed.  Transition Care Management Follow-up Telephone Call Date of Discharge: 06/25/23 Discharge Facility: [facility] ([facility]) Type of Discharge: Emergency Department Reason for ED Visit: Other: (fall) How have you been since you were released from the hospital?: Better Any questions or concerns?: No  Items Reviewed: Did you receive and understand the discharge instructions provided?: Yes Medications obtained,verified, and reconciled?: Yes (Medications Reviewed) Any new allergies since your discharge?: No Dietary orders reviewed?: Yes Do you have support at home?: Yes People in Home [RPT]: spouse  Medications Reviewed Today: Medications Reviewed Today   Medications were not reviewed in this encounter     Home Care and Equipment/Supplies: Were Home Health Services Ordered?: NA Any new equipment or medical supplies ordered?: NA  Functional Questionnaire: Do you need assistance with bathing/showering or dressing?: No Do you need assistance with meal preparation?: No Do you need assistance with eating?: No Do you have difficulty maintaining continence: No Do you need assistance with getting out of bed/getting out of a chair/moving?: No Do you have difficulty managing or taking your medications?: No  Follow up appointments reviewed: PCP Follow-up appointment confirmed?: NA (transferred to [facility] Follow-up appointment confirmed?: NA Do you need transportation to your follow-up appointment?: No Do you understand care options if your condition(s) worsen?: Yes-patient verbalized understanding    SIGNATURE Karena Addison, LPN [facility] Nurse Health Advisor Direct Dial [phone]

## 2023-07-02 ENCOUNTER — Ambulatory Visit (HOSPITAL_BASED_OUTPATIENT_CLINIC_OR_DEPARTMENT_OTHER): Payer: Medicare PPO | Admitting: Family Medicine

## 2023-07-02 ENCOUNTER — Telehealth (HOSPITAL_BASED_OUTPATIENT_CLINIC_OR_DEPARTMENT_OTHER): Payer: Self-pay | Admitting: *Deleted

## 2023-07-02 ENCOUNTER — Ambulatory Visit (HOSPITAL_BASED_OUTPATIENT_CLINIC_OR_DEPARTMENT_OTHER): Admitting: *Deleted

## 2023-07-02 ENCOUNTER — Encounter (HOSPITAL_BASED_OUTPATIENT_CLINIC_OR_DEPARTMENT_OTHER): Payer: Self-pay

## 2023-07-02 DIAGNOSIS — Z0000 Encounter for general adult medical examination without abnormal findings: Secondary | ICD-10-CM

## 2023-07-02 NOTE — Telephone Encounter (Signed)
 Patient would like to know she had INR checked recently at ER. Wanted to see when she should come back to have this rechecked. Let her know Trula Gable will be out of the office but she can wait for return 4/15. Please advise

## 2023-07-02 NOTE — Patient Instructions (Signed)
 [name] , Thank you for taking time to come for your Medicare Wellness Visit. I appreciate your ongoing commitment to your health goals. Please review the following plan we discussed and let me know if I can assist you in the future.   Screening recommendations/referrals:  Bone Density: due order placed  Recommended yearly ophthalmology/optometry visit for glaucoma screening and checkup Recommended yearly dental visit for hygiene and checkup  Vaccinations: Influenza vaccine: completed Pneumococcal vaccine: completed Tdap vaccine: completed Shingles vaccine: completed    Advanced directives: copy in chart  Conditions/risks identified: falls  Next appointment: 1 year   Preventive Care 65 Years and Older, Female Preventive care refers to lifestyle choices and visits with your health care provider that can promote health and wellness. What does preventive care include? A yearly physical exam. This is also called an annual well check. Dental exams once or twice a year. Routine eye exams. Ask your health care provider how often you should have your eyes checked. Personal lifestyle choices, including: Daily care of your teeth and gums. Regular physical activity. Eating a healthy diet. Avoiding tobacco and drug use. Limiting alcohol use. Practicing safe sex. Taking low-dose aspirin every day. Taking vitamin and mineral supplements as recommended by your health care provider. What happens during an annual well check? The services and screenings done by your health care provider during your annual well check will depend on your age, overall health, lifestyle risk factors, and family history of disease. Counseling  Your health care provider may ask you questions about your: Alcohol use. Tobacco use. Drug use. Emotional well-being. Home and relationship well-being. Sexual activity. Eating habits. History of falls. Memory and ability to understand (cognition). Work and work  Astronomer. Reproductive health. Screening  You may have the following tests or measurements: Height, weight, and BMI. Blood pressure. Lipid and cholesterol levels. These may be checked every 5 years, or more frequently if you are over 34 years old. Skin check. Lung cancer screening. You may have this screening every year starting at age 23 if you have a 30-pack-year history of smoking and currently smoke or have quit within the past 15 years. Fecal occult blood test (FOBT) of the stool. You may have this test every year starting at age 17. Flexible sigmoidoscopy or colonoscopy. You may have a sigmoidoscopy every 5 years or a colonoscopy every 10 years starting at age 40. Hepatitis C blood test. Hepatitis B blood test. Sexually transmitted disease (STD) testing. Diabetes screening. This is done by checking your blood sugar (glucose) after you have not eaten for a while (fasting). You may have this done every 1-3 years. Bone density scan. This is done to screen for osteoporosis. You may have this done starting at age 47. Mammogram. This may be done every 1-2 years. Talk to your health care provider about how often you should have regular mammograms. Talk with your health care provider about your test results, treatment options, and if necessary, the need for more tests. Vaccines  Your health care provider may recommend certain vaccines, such as: Influenza vaccine. This is recommended every year. Tetanus, diphtheria, and acellular pertussis (Tdap, Td) vaccine. You may need a Td booster every 10 years. Zoster vaccine. You may need this after age 47. Pneumococcal 13-valent conjugate (PCV13) vaccine. One dose is recommended after age 71. Pneumococcal polysaccharide (PPSV23) vaccine. One dose is recommended after age 21. Talk to your health care provider about which screenings and vaccines you need and how often you need them. This  information is not intended to replace advice given to you by  your health care provider. Make sure you discuss any questions you have with your health care provider. Document Released: 04/02/2015 Document Revised: 11/24/2015 Document Reviewed: 01/05/2015 Elsevier Interactive Patient Education  2017 [employer].  Fall Prevention in the Home Falls can cause injuries. They can happen to people of all ages. There are many things you can do to make your home safe and to help prevent falls. What can I do on the outside of my home? Regularly fix the edges of walkways and driveways and fix any cracks. Remove anything that might make you trip as you walk through a door, such as a raised step or threshold. Trim any bushes or trees on the path to your home. Use bright outdoor lighting. Clear any walking paths of anything that might make someone trip, such as rocks or tools. Regularly check to see if handrails are loose or broken. Make sure that both sides of any steps have handrails. Any raised decks and porches should have guardrails on the edges. Have any leaves, snow, or ice cleared regularly. Use sand or salt on walking paths during winter. Clean up any spills in your garage right away. This includes oil or grease spills. What can I do in the bathroom? Use night lights. Install grab bars by the toilet and in the tub and shower. Do not use towel bars as grab bars. Use non-skid mats or decals in the tub or shower. If you need to sit down in the shower, use a plastic, non-slip stool. Keep the floor dry. Clean up any water that spills on the floor as soon as it happens. Remove soap buildup in the tub or shower regularly. Attach bath mats securely with double-sided non-slip rug tape. Do not have throw rugs and other things on the floor that can make you trip. What can I do in the bedroom? Use night lights. Make sure that you have a light by your bed that is easy to reach. Do not use any sheets or blankets that are too big for your bed. They should not hang  down onto the floor. Have a firm chair that has side arms. You can use this for support while you get dressed. Do not have throw rugs and other things on the floor that can make you trip. What can I do in the kitchen? Clean up any spills right away. Avoid walking on wet floors. Keep items that you use a lot in easy-to-reach places. If you need to reach something above you, use a strong step stool that has a grab bar. Keep electrical cords out of the way. Do not use floor polish or wax that makes floors slippery. If you must use wax, use non-skid floor wax. Do not have throw rugs and other things on the floor that can make you trip. What can I do with my stairs? Do not leave any items on the stairs. Make sure that there are handrails on both sides of the stairs and use them. Fix handrails that are broken or loose. Make sure that handrails are as long as the stairways. Check any carpeting to make sure that it is firmly attached to the stairs. Fix any carpet that is loose or worn. Avoid having throw rugs at the top or bottom of the stairs. If you do have throw rugs, attach them to the floor with carpet tape. Make sure that you have a light switch at the top  of the stairs and the bottom of the stairs. If you do not have them, ask someone to add them for you. What else can I do to help prevent falls? Wear shoes that: Do not have high heels. Have rubber bottoms. Are comfortable and fit you well. Are closed at the toe. Do not wear sandals. If you use a stepladder: Make sure that it is fully opened. Do not climb a closed stepladder. Make sure that both sides of the stepladder are locked into place. Ask someone to hold it for you, if possible. Clearly mark and make sure that you can see: Any grab bars or handrails. First and last steps. Where the edge of each step is. Use tools that help you move around (mobility aids) if they are needed. These  include: Canes. Walkers. Scooters. Crutches. Turn on the lights when you go into a dark area. Replace any light bulbs as soon as they burn out. Set up your furniture so you have a clear path. Avoid moving your furniture around. If any of your floors are uneven, fix them. If there are any pets around you, be aware of where they are. Review your medicines with your doctor. Some medicines can make you feel dizzy. This can increase your chance of falling. Ask your doctor what other things that you can do to help prevent falls. This information is not intended to replace advice given to you by your health care provider. Make sure you discuss any questions you have with your health care provider. Document Released: 12/31/2008 Document Revised: 08/12/2015 Document Reviewed: 04/10/2014 Elsevier Interactive Patient Education  2017 [employer].

## 2023-07-02 NOTE — Progress Notes (Signed)
 Subjective:   Andrea Simmons is a 83 y.o. female who presents for Medicare Annual (Subsequent) preventive examination.  Visit Complete:Virtual  Patient Location: Home Provider Location: In Office  Patient Medicare AWV questionnaire was completed by the patient on 07/02/23; I have confirmed that all information answered by patient is correct and no changes since this date.        Objective:    There were no vitals filed for this visit. There is no height or weight on file to calculate BMI.     06/24/2023    9:43 PM 10/08/2022   11:17 AM 05/02/2022   12:10 AM 02/20/2022   10:22 AM 11/16/2021    1:06 PM 05/02/2021   11:37 AM 03/27/2021   11:59 PM  Advanced Directives  Does Patient Have a Medical Advance Directive? No No No Yes Yes Yes No  Type of Advance Directive    Living will;Healthcare Power of [employer] Power of [place];Living will    Does patient want to make changes to medical advance directive?    No - Patient declined No - Patient declined No - Patient declined   Copy of Healthcare Power of Attorney in Chart?     Yes - validated most recent copy scanned in chart (See row information)    Would patient like information on creating a medical advance directive? No - Patient declined No - Patient declined     No - Patient declined    Current Medications (verified) Outpatient Encounter Medications as of 07/02/2023  Medication Sig   ACCU-CHEK GUIDE test strip CHECK BLOOD SUGARS DAILY   Accu-Chek Softclix Lancets lancets USE AS INSTRUCTED TO TEST SUGARS TWICE A DAY   amLODipine (NORVASC) 10 MG tablet TAKE 1 TABLET BY MOUTH EVERY DAY   Biotin 1000 MCG tablet Take 1,000 mcg by mouth daily.   Blood Glucose Monitoring Suppl (ACCU-CHEK GUIDE) w/Device KIT 1 each by Does not apply route 2 (two) times daily. To test sugars. Dx. E11.9   DULoxetine (CYMBALTA) 30 MG capsule Take 3 capsules (90 mg total) by mouth daily.   eszopiclone (LUNESTA) 2 MG TABS tablet Take 1 tablet (2 mg  total) by mouth at bedtime as needed for sleep. Take immediately before bedtime   FARXIGA 5 MG TABS tablet TAKE 1 TABLET (5 MG TOTAL) BY MOUTH DAILY.   fenofibrate (TRICOR) 48 MG tablet Take 48 mg by mouth daily.   FLAREX 0.1 % ophthalmic suspension Apply to eye.   furosemide (LASIX) 20 MG tablet TAKE 1 TABLET BY MOUTH EVERY DAY AS NEEDED   hydrALAZINE (APRESOLINE) 100 MG tablet Take 0.5 tablets (50 mg total) by mouth 2 (two) times daily.   labetalol (NORMODYNE) 200 MG tablet TAKE 1 TABLET BY MOUTH TWICE A DAY   levothyroxine (SYNTHROID) 25 MCG tablet TAKE 1 TABLET BY MOUTH EVERY DAY BEFORE BREAKFAST   Magnesium 500 MG TABS Take 500 mg by mouth daily.   rosuvastatin (CRESTOR) 10 MG tablet TAKE 1 TABLET BY MOUTH EVERY DAY   valsartan (DIOVAN) 320 MG tablet TAKE 1 TABLET BY MOUTH EVERY DAY   warfarin (COUMADIN) 5 MG tablet TAKE 1/2 TABLET (2.5 MG) BY MOUTH DAILY EXCEPT TAKE 1 TABLET (5 MG) ON MONDAYS, WEDNESDAYS AND SATURDAYS OR AS DIRECTED BY ANTICOAGULATION CLINIC   No facility-administered encounter medications on file as of 07/02/2023.    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) 05/15/2022   Anxiety    Breast cancer (HCC)    Chronic osteoarthritis    CKD stage 3b, GFR 30-44 ml/min (HCC) 01/25/2021   Depression    Diabetes mellitus type II    Diabetes type 2, controlled (HCC) 08/27/2008   Qualifier: Diagnosis of   By: Autry Legions MD, Alveda Aures        Essential hypertension 03/20/2007   Qualifier: Diagnosis of   By: Rochelle Chu RN, CGRN, Sheri         Exertional dyspnea 11/09/2008   11/20/2017  Walked RA x 3 laps @ 185 ft each stopped due to  End of study, fast pace, no desat, min sob   - Echo 12/05/2017 - LVEF 60-65%, moderate LVH, normal wall motion, grade 1 DD,   indeterminate LV filling pressure, normal GLS at -21%, normal LA   size, trivial  TR, RVSP 20 mmHg, normal IVC.           Fibromyalgia    GERD (gastroesophageal reflux disease)    Hyperlipidemia    Hypertension    Hypothyroidism    Malignant neoplasm of breast (female), unspecified site 1993   L breast s/p mastectomy and tamoxifen x 56yrs   Osteoporosis    Other pulmonary embolism and infarction 2008 and 2009   chronic anticoag - [facility]   Past Surgical History:  Procedure Laterality Date   APPENDECTOMY     BALLOON DILATION N/A 10/18/2020   Procedure: BALLOON DILATION;  Surgeon: Vinetta Greening, DO;  [facility];  Service: Endoscopy;  Laterality: N/A;   BIOPSY  10/18/2020   Procedure: BIOPSY;  Surgeon: Vinetta Greening, DO;  [facility]: [facility];  Service: Endoscopy;;  gastric    BREAST BIOPSY Left 1993   BREAST BIOPSY Right 09/25/2014   stero. Benign   BREAST RECONSTRUCTION Left    CATARACT EXTRACTION     ESOPHAGOGASTRODUODENOSCOPY (EGD) WITH PROPOFOL N/A 10/18/2020   Procedure: ESOPHAGOGASTRODUODENOSCOPY (EGD) WITH PROPOFOL;  Surgeon: Vinetta Greening, DO;  Location: [facility];  Service: Endoscopy;  Laterality: N/A;  11:00am   MASTECTOMY Left    ROTATOR CUFF REPAIR     SPINAL FUSION      x 2   TOTAL ABDOMINAL HYSTERECTOMY W/ BILATERAL SALPINGOOPHORECTOMY     TUBAL LIGATION     Family History  Problem Relation Age of Onset   Prostate cancer Maternal Uncle    Hypertension Maternal Grandfather    Breast cancer Cousin    Breast cancer Cousin 40   Depression Other        Parent   Arthritis Other        Parent, Grandparent   Hypertension Other        Grandparent   Hyperlipidemia Other        FMH   Miscarriages / Stillbirths Other        Grandparent   Stroke Other        [facility]   Social History   Socioeconomic History   Marital status: Married    Spouse name: Not on file   Number of children: 1   Years of education: Not on file   Highest education level: Not on file  Occupational History   Occupation: Orthoptist: RETIRED   Occupation: Producer, television/film/video   Occupation: school bus driver  Tobacco Use   Smoking status: Never   Smokeless tobacco: Never  Vaping Use   Vaping status: Never  Used  Substance and Sexual Activity   Alcohol use: No    Alcohol/week: 0.0 standard drinks of alcohol   Drug use: No   Sexual activity: Not Currently  Other Topics Concern   Not on file  Social History Narrative   Not on file   Social Drivers of Health   Financial Resource Strain: Low Risk  (11/16/2021)   Overall Financial Resource Strain (CARDIA)    Difficulty of Paying Living Expenses: Not hard at all  Food Insecurity: No Food Insecurity (02/20/2022)   Hunger Vital Sign    Worried About Running Out of Food in the Last Year: Never true    Ran Out of Food in the Last Year: Never true  Transportation Needs: No Transportation Needs (02/20/2022)   PRAPARE - Administrator, Civil Service (Medical): No    Lack of Transportation (Non-Medical): No  Physical Activity: Insufficiently Active (11/16/2021)   Exercise Vital Sign    Days of Exercise per Week: 3 days    Minutes of Exercise per Session: 30 min  Stress: No Stress Concern Present (11/16/2021)   [employer] of Occupational Health - Occupational Stress Questionnaire    Feeling of Stress : Not at all  Social Connections: Moderately Isolated (11/16/2021)   Social Connection and Isolation Panel [NHANES]    Frequency of Communication with Friends and Family: More than three times a week    Frequency of Social Gatherings with Friends and Family: More than three times a week    Attends Religious Services: Never    Database administrator or Organizations: No    Attends Banker Meetings: Never    Marital Status: Married    Tobacco Counseling Counseling given: Not Answered   Clinical Intake:                        Activities of Daily Living     No data to display           Patient Care Team: Caudle,  Shelton Silvas, FNP as PCP - General (Family Medicine) Hilarie Fredrickson, MD as Consulting Physician (Gastroenterology) Waymon Budge, MD as Consulting Physician (Pulmonary Disease) Dorisann Frames, MD as Consulting Physician (Endocrinology) Rossie Muskrat, MD as Consulting Physician (Rheumatology) [name], Steva Ready., MD as Attending Physician (Psychiatry) Alferd Patee, MD as Referring Physician (Anesthesiology) Johney Maine, MD as Consulting Physician (Hematology)  Indicate any recent Medical Services you may have received from other than [facility] providers in the past year (date may be approximate).     Assessment:   This is a routine wellness examination for Katieann.  Hearing/Vision screen No results found.   Goals Addressed   None   Depression Screen    04/03/2023    1:12 PM 11/30/2022    2:11 PM 02/20/2022   10:22 AM 02/16/2022   12:41 PM 01/13/2022   10:50 AM 11/16/2021    1:04 PM 08/01/2021    1:33 PM  PHQ 2/9 Scores  PHQ - 2 Score 2 4 0 0 0 0 1  PHQ- 9 Score 11 17  0 0  4    Fall Risk    04/03/2023    1:11 PM 02/16/2022   12:42 PM 01/13/2022   10:51 AM 11/16/2021   12:58 PM 08/01/2021    1:33 PM  Fall Risk   Falls in the past year? 1 1 1  0 1  Number falls in past yr: 1 0 0  0 1  Injury with Fall? 0 1 1 0 0  Risk for fall due to : History of fall(s);Impaired balance/gait;Impaired mobility Impaired balance/gait History of fall(s) No Fall Risks History of fall(s)  Follow up Falls evaluation completed Falls evaluation completed Falls evaluation completed Falls prevention discussed Falls evaluation completed    MEDICARE RISK AT HOME:    TIMED UP AND GO:  Was the test performed?  No    Cognitive Function:    11/20/2014    1:14 PM  MMSE - Mini Mental State Exam  Orientation to time 5  Orientation to Place 5  Registration 3  Attention/ Calculation 5  Recall 2  Language- name 2 objects 2  Language- repeat 1  Language- follow 3 step command 3  Language-  read & follow direction 1  Write a sentence 1  Copy design 1  Total score 29        11/16/2021    1:09 PM  6CIT Screen  What Year? 0 points  What month? 0 points  What time? 0 points  Count back from 20 0 points  Months in reverse 0 points  Repeat phrase 0 points  Total Score 0 points    Immunizations Immunization History  Administered Date(s) Administered   Fluad Quad(high Dose 65+) 01/10/2019, 01/26/2020   Influenza Whole 03/21/2007, 12/18/2008, 01/20/2010   Influenza, High Dose Seasonal PF 02/27/2012, 03/24/2013, 02/09/2014, 01/19/2015, 02/21/2016, 01/22/2017, 01/13/2022, 01/19/2023   Influenza, Quadrivalent, Recombinant, Inj, Pf 12/31/2017, 01/24/2021   Influenza,inj,Quad PF,6+ Mos 01/22/2013   Influenza-Unspecified 02/18/2012   Moderna Covid-19 Fall Seasonal Vaccine 70yrs & older 04/03/2022   Moderna Sars-Covid-2 Vaccination 05/19/2019, 06/16/2019, 02/03/2020, 09/07/2020   Pneumococcal Conjugate-13 04/20/2016   Pneumococcal Polysaccharide-23 03/20/2006, 03/08/2007, 12/19/2007   Td 08/27/2008   Tdap 04/03/2022   Zoster Recombinant(Shingrix) 12/20/2016, 05/09/2017   Zoster, Live 12/11/2012    TDAP status: Up to date  Flu Vaccine status: Up to date  Pneumococcal vaccine status: Up to date  Covid-19 vaccine status: Completed vaccines  Qualifies for Shingles Vaccine? Yes   Zostavax completed Yes   Shingrix Completed?: Yes  Screening Tests Health Maintenance  Topic Date Due   FOOT EXAM  02/17/2023   Diabetic kidney evaluation - Urine ACR  05/17/2023   OPHTHALMOLOGY EXAM  07/24/2023 (Originally 02/15/2022)   HEMOGLOBIN A1C  09/04/2023   INFLUENZA VACCINE  10/19/2023   Diabetic kidney evaluation - eGFR measurement  06/23/2024   Medicare Annual Wellness (AWV)  07/01/2024   DTaP/Tdap/Td (3 - Td or Tdap) 04/03/2032   Pneumonia Vaccine 61+ Years old  Completed   DEXA SCAN  Completed   Zoster Vaccines- Shingrix  Completed   HPV VACCINES  Aged Out    Meningococcal B Vaccine  Aged Out   COVID-19 Vaccine  Discontinued    Health Maintenance  Health Maintenance Due  Topic Date Due   FOOT EXAM  02/17/2023   Diabetic kidney evaluation - Urine ACR  05/17/2023    Colorectal cancer screening: No longer required.   Mammogram status: No longer required due to age.  Bone Density status: Ordered 07/02/23. Pt provided with contact info and advised to call to schedule appt.  Lung Cancer Screening: (Low Dose CT Chest recommended if Age 47-80 years, 20 pack-year currently smoking OR have quit w/in 15years.) does not qualify.   Lung Cancer Screening Referral: NA  Additional Screening:  Hepatitis C Screening: does qualify; Completed   Vision Screening: Recommended annual ophthalmology exams for early detection of glaucoma and other disorders  of the eye. Is the patient up to date with their annual eye exam?  Yes  Who is the provider or what is the name of the office in which the patient attends annual eye exams? Dr Lennis Rabon If pt is not established with a provider, would they like to be referred to a provider to establish care? No .   Dental Screening: Recommended annual dental exams for proper oral hygiene  Diabetic Foot Exam: Diabetic Foot Exam: Overdue, Pt has been advised about the importance in completing this exam. Pt is scheduled for diabetic foot exam on at next appointment with Sylvester Evert .  Community Resource Referral / Chronic Care Management: CRR required this visit?  No   CCM required this visit?  No     Plan:     I have personally reviewed and noted the following in the patient's chart:   Medical and social history Use of alcohol, tobacco or illicit drugs  Current medications and supplements including opioid prescriptions. Patient is not currently taking opioid prescriptions. Functional ability and status Nutritional status Physical activity Advanced directives List of other physicians Hospitalizations, surgeries,  and ER visits in previous 12 months Vitals Screenings to include cognitive, depression, and falls Referrals and appointments  In addition, I have reviewed and discussed with patient certain preventive protocols, quality metrics, and best practice recommendations. A written personalized care plan for preventive services as well as general preventive health recommendations were provided to patient.     Ella Gun, CMA   07/02/2023  Discharge Instructions   None

## 2023-07-03 ENCOUNTER — Telehealth (HOSPITAL_BASED_OUTPATIENT_CLINIC_OR_DEPARTMENT_OTHER): Admitting: Family Medicine

## 2023-07-03 NOTE — Telephone Encounter (Signed)
 Pt advised with verbal understanding

## 2023-07-05 ENCOUNTER — Ambulatory Visit: Admitting: Dermatology

## 2023-07-05 ENCOUNTER — Encounter: Payer: Self-pay | Admitting: Dermatology

## 2023-07-05 VITALS — BP 111/60 | HR 60

## 2023-07-05 DIAGNOSIS — Z872 Personal history of diseases of the skin and subcutaneous tissue: Secondary | ICD-10-CM

## 2023-07-05 DIAGNOSIS — Z86007 Personal history of in-situ neoplasm of skin: Secondary | ICD-10-CM

## 2023-07-05 DIAGNOSIS — L570 Actinic keratosis: Secondary | ICD-10-CM

## 2023-07-05 DIAGNOSIS — D049 Carcinoma in situ of skin, unspecified: Secondary | ICD-10-CM

## 2023-07-05 NOTE — Progress Notes (Signed)
   New Patient Visit   Subjective  Andrea Simmons is a 83 y.o. female who presents for the following: consult for CIS Pt has a bx proven CIS on Right distal lateral dorsal 1st toe that she'd like to consult for treatment. She had these lesions biopsied at her podiatrist. She stats   The following portions of the chart were reviewed this encounter and updated as appropriate: medications, allergies, medical history  Review of Systems:  No other skin or systemic complaints except as noted in HPI or Assessment and Plan.  Objective  Well appearing patient in no apparent distress; mood and affect are within normal limits.  A focused examination was performed of the following areas: Right distal lateral dorsal 1st toe  Relevant exam findings are noted in the Assessment and Plan.    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  - Call if any new or changing lesions are noted between office visits   Need photos from previous derm and than will consider injection of 5 FU.  The patient was counseled regarding a known history of squamous cell carcinoma in situ (SCCIS) on the right great toe and a previously identified HAK with concern for potential underlying malignancy on the right third toe, though no clinical evidence of active lesions was noted on today's exam. To guide further management, we discussed the importance of obtaining biopsy site photos from previous dermatologists to accurately determine the original location  and extent of the lesions, which will inform our treatment approach. Based on the lesion history and pending visual confirmation, we may consider intralesional 5-fluorouracil (5-FU) as a targeted treatment for localized disease, topical 5-FU applied over 6 weeks for field therapy in cases of broader subclinical involvement, or Mohs micrographic surgery if there is persistent or recurrent disease requiring precise margin control. The patient was counseled on the risks and benefits of each treatment option: 5-FU may cause localized irritation and requires adherence to a multi-week regimen, while Mohs surgery offers the highest cure rate but involves a surgical procedure with possible healing time and scarring. The patient was advised that treatment selection will depend on lesion location, clinical behavior, and the prior biopsy photo review, and a definitive plan will be made once those records are received. SQUAMOUS CELL CARCINOMA IN SITU (SCCIS) OF SKIN   HYPERTROPHIC ACTINIC KERATOSIS    Return in about 4 weeks (around 08/02/2023) for CIS follow up.  I, Wilson Hasten, CMA, am acting as scribe for Deneise Finlay, MD.   Documentation: I have reviewed the above documentation for accuracy and completeness, and I agree with the above.  Deneise Finlay, MD

## 2023-07-05 NOTE — Patient Instructions (Signed)

## 2023-07-11 ENCOUNTER — Other Ambulatory Visit: Payer: Self-pay | Admitting: Psychiatry

## 2023-07-12 DIAGNOSIS — M810 Age-related osteoporosis without current pathological fracture: Secondary | ICD-10-CM | POA: Diagnosis not present

## 2023-07-12 DIAGNOSIS — G609 Hereditary and idiopathic neuropathy, unspecified: Secondary | ICD-10-CM | POA: Diagnosis not present

## 2023-07-12 DIAGNOSIS — I10 Essential (primary) hypertension: Secondary | ICD-10-CM | POA: Diagnosis not present

## 2023-07-12 DIAGNOSIS — E039 Hypothyroidism, unspecified: Secondary | ICD-10-CM | POA: Diagnosis not present

## 2023-07-12 DIAGNOSIS — N189 Chronic kidney disease, unspecified: Secondary | ICD-10-CM | POA: Diagnosis not present

## 2023-07-12 DIAGNOSIS — E7800 Pure hypercholesterolemia, unspecified: Secondary | ICD-10-CM | POA: Diagnosis not present

## 2023-07-12 DIAGNOSIS — E1165 Type 2 diabetes mellitus with hyperglycemia: Secondary | ICD-10-CM | POA: Diagnosis not present

## 2023-07-12 DIAGNOSIS — F329 Major depressive disorder, single episode, unspecified: Secondary | ICD-10-CM | POA: Diagnosis not present

## 2023-07-12 DIAGNOSIS — H04123 Dry eye syndrome of bilateral lacrimal glands: Secondary | ICD-10-CM | POA: Diagnosis not present

## 2023-07-16 NOTE — Telephone Encounter (Signed)
 LVM per DPR to ask if patient is still taking vitamin D2.

## 2023-07-17 NOTE — Telephone Encounter (Signed)
 Pt LVM @ 3:19p.  She is NOT taking Vitamin D .  Her heart doctor did a full blood workup on her and told her to stop taking it.  Next appt 10/29

## 2023-07-20 ENCOUNTER — Telehealth: Payer: Self-pay

## 2023-07-20 NOTE — Telephone Encounter (Signed)
 Called patient regarding her apt Monday with Katie to know whether patient has received CPAP machine. Patient states she has not received machine, and no order was placed for her to get one. She had her sleep study done 12/10/2022 and would like to follow-up on her sleep result.

## 2023-07-23 ENCOUNTER — Ambulatory Visit: Admitting: Nurse Practitioner

## 2023-07-23 ENCOUNTER — Encounter: Payer: Self-pay | Admitting: Nurse Practitioner

## 2023-07-23 ENCOUNTER — Other Ambulatory Visit (HOSPITAL_BASED_OUTPATIENT_CLINIC_OR_DEPARTMENT_OTHER): Payer: Self-pay | Admitting: Family Medicine

## 2023-07-23 DIAGNOSIS — E039 Hypothyroidism, unspecified: Secondary | ICD-10-CM

## 2023-07-23 DIAGNOSIS — Z7901 Long term (current) use of anticoagulants: Secondary | ICD-10-CM | POA: Diagnosis not present

## 2023-07-23 DIAGNOSIS — G4733 Obstructive sleep apnea (adult) (pediatric): Secondary | ICD-10-CM | POA: Insufficient documentation

## 2023-07-23 DIAGNOSIS — E782 Mixed hyperlipidemia: Secondary | ICD-10-CM

## 2023-07-23 DIAGNOSIS — G4730 Sleep apnea, unspecified: Secondary | ICD-10-CM

## 2023-07-23 DIAGNOSIS — N184 Chronic kidney disease, stage 4 (severe): Secondary | ICD-10-CM

## 2023-07-23 NOTE — Patient Instructions (Addendum)
 Start CPAP every night, minimum of 4-6 hours a night.  Change equipment as directed. Wash your tubing with warm soap and water  daily, hang to dry. Wash humidifier portion weekly. Use bottled, distilled water  and change daily Be aware of reduced alertness and do not drive or operate heavy machinery if experiencing this or drowsiness.  Exercise encouraged, as tolerated. Healthy weight management discussed.  Avoid or decrease alcohol  consumption and medications that make you more sleepy, if possible. Notify if persistent daytime sleepiness occurs even with consistent use of PAP therapy.  Change supplies.... Every month Mask cushions and/or nasal pillows CPAP machine filters Every 3 months Mask frame (not including the headgear) CPAP tubing Every 6 months Mask headgear Chin strap (if applicable) Humidifier water  tub  We discussed how untreated sleep apnea puts an individual at risk for cardiac arrhthymias, pulm HTN, DM, stroke and increases their risk for daytime accidents. We also briefly reviewed treatment options including weight loss, side sleeping position, oral appliance, CPAP therapy or referral to ENT for possible surgical options  Follow up 10-12 weeks with Andrea Jerry Shakena Callari,NP, or sooner, if needed

## 2023-07-23 NOTE — Progress Notes (Signed)
 @Patient  ID: Andrea Simmons, female    DOB: 1940-08-20, 82 y.o.   MRN: [id number]  Chief Complaint  Patient presents with   Follow-up    Sleep result.    Referring provider: Nonda Bays, *  HPI: 83 year old female, never smoker followed for OSA. She is a patient of Dr. Antonette Batters and last seen in office 11/13/2022. 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   TEST/EVENTS:  12/10/2022 HST: AHI 10.8/h, SpO2 low 88%  11/13/2022: OV with Dr. Linder Revere. Sleep evaluatin. Loud snoring. Daughter with OSA. Never had prior sleep study. Doesn't sleep very well. Wakes frequently. Always tired in daytime but never naps. Lunesta  not helpful. HST ordered. Prior nodule resolved.   07/23/2023: Today - follow up Patient presents today for overdue follow-up.  After her last visit, she had a home sleep study that revealed mild sleep apnea.  She had some life events that delayed her follow-up.  She feels unchanged compared to her last visit.  Has loud snoring at night.  She has also had issues with her sleep for as long as she can member.  She is a very light sleeper.  Wakes up often throughout the night.  She is tired during the day but does not ever nap or doze off.  Has tried sleep aids in the past but much success.  Denies any issues with drowsy driving or sleep parasomnia/paralysis.  Allergies  Allergen Reactions   Lipitor [Atorvastatin]     Unknown   Morphine  Nausea And Vomiting   Meperidine Nausea And Vomiting   Naproxen Sodium Nausea And Vomiting    Immunization History  Administered Date(s) Administered   Fluad Quad(high Dose 65+) 01/10/2019, 01/26/2020   Influenza Whole 03/21/2007, 12/18/2008, 01/20/2010   Influenza, High Dose Seasonal PF 02/27/2012, 03/24/2013, 02/09/2014, 01/19/2015, 02/21/2016, 01/22/2017, 01/13/2022, 01/19/2023   Influenza,  Quadrivalent, Recombinant, Inj, Pf 12/31/2017, 01/24/2021   Influenza,inj,Quad PF,6+ Mos 01/22/2013   Influenza-Unspecified 02/18/2012   Moderna Covid-19 Fall Seasonal Vaccine 85yrs & older 04/03/2022   Moderna Sars-Covid-2 Vaccination 05/19/2019, 06/16/2019, 02/03/2020, 09/07/2020   Pneumococcal Conjugate-13 04/20/2016   Pneumococcal Polysaccharide-23 03/20/2006, 03/08/2007, 12/19/2007   Td 08/27/2008   Tdap 04/03/2022   Zoster Recombinant(Shingrix) 12/20/2016, 05/09/2017   Zoster, Live 12/11/2012    Past Medical History:  Diagnosis Date   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) 05/15/2022   Anxiety    Breast cancer (HCC)    Chronic osteoarthritis    CKD stage 3b, GFR 30-44 ml/min (HCC) 01/25/2021   Depression    Diabetes mellitus type II    Diabetes type 2, controlled (HCC) 08/27/2008   Qualifier: Diagnosis of   By: Autry Legions MD, Alveda Aures        Essential hypertension 03/20/2007   Qualifier: Diagnosis of   By: Rochelle Chu RN, CGRN, Sheri         Exertional dyspnea 11/09/2008   11/20/2017  Walked RA x 3 laps @ 185 ft each stopped due to  End of study, fast pace, no desat, min sob   - Echo 12/05/2017 - LVEF 60-65%, moderate LVH, normal wall motion, grade 1 DD,   indeterminate LV filling pressure, normal GLS at -21%, normal LA   size, trivial TR, RVSP 20 mmHg, normal IVC.           Fibromyalgia    GERD (gastroesophageal reflux disease)  Hyperlipidemia    Hypertension    Hypothyroidism    Malignant neoplasm of breast (female), unspecified site 1993   L breast s/p mastectomy and tamoxifen x 40yrs   Osteoporosis    Other pulmonary embolism and infarction 2008 and 2009   chronic anticoag - [facility]    Tobacco History: Social History   Tobacco Use  Smoking Status Never   Passive exposure: Never  Smokeless Tobacco Never   Counseling given: Not Answered   Outpatient Medications Prior to Visit  Medication Sig Dispense Refill   ACCU-CHEK GUIDE test strip CHECK BLOOD  SUGARS DAILY 100 strip 12   Accu-Chek Softclix Lancets lancets USE AS INSTRUCTED TO TEST SUGARS TWICE A DAY 100 each 12   amLODipine  (NORVASC ) 10 MG tablet TAKE 1 TABLET BY MOUTH EVERY DAY 90 tablet 2   Biotin 1000 MCG tablet Take 1,000 mcg by mouth daily.     Blood Glucose Monitoring Suppl (ACCU-CHEK GUIDE) w/Device KIT 1 each by Does not apply route 2 (two) times daily. To test sugars. Dx. E11.9 1 kit 1   DULoxetine  (CYMBALTA ) 30 MG capsule Take 3 capsules (90 mg total) by mouth daily. 270 capsule 3   eszopiclone  (LUNESTA ) 2 MG TABS tablet Take 1 tablet (2 mg total) by mouth at bedtime as needed for sleep. Take immediately before bedtime 30 tablet 5   FARXIGA  5 MG TABS tablet TAKE 1 TABLET (5 MG TOTAL) BY MOUTH DAILY. 30 tablet 2   fenofibrate (TRICOR) 48 MG tablet Take 48 mg by mouth daily.     FLAREX 0.1 % ophthalmic suspension Apply to eye.     furosemide  (LASIX ) 20 MG tablet TAKE 1 TABLET BY MOUTH EVERY DAY AS NEEDED 90 tablet 2   hydrALAZINE  (APRESOLINE ) 100 MG tablet Take 0.5 tablets (50 mg total) by mouth 2 (two) times daily. 90 tablet 0   labetalol  (NORMODYNE ) 200 MG tablet TAKE 1 TABLET BY MOUTH TWICE A DAY 180 tablet 2   levothyroxine  (SYNTHROID ) 25 MCG tablet TAKE 1 TABLET BY MOUTH EVERY DAY BEFORE BREAKFAST 90 tablet 3   Magnesium  500 MG TABS Take 500 mg by mouth daily.     rosuvastatin  (CRESTOR ) 10 MG tablet TAKE 1 TABLET BY MOUTH EVERY DAY 90 tablet 1   valsartan  (DIOVAN ) 320 MG tablet TAKE 1 TABLET BY MOUTH EVERY DAY 90 tablet 2   warfarin (COUMADIN ) 5 MG tablet TAKE 1/2 TABLET (2.5 MG) BY MOUTH DAILY EXCEPT TAKE 1 TABLET (5 MG) ON MONDAYS, WEDNESDAYS AND SATURDAYS OR AS DIRECTED BY ANTICOAGULATION CLINIC 75 tablet 2   No facility-administered medications prior to visit.     Review of Systems:   Constitutional: No weight loss or gain, night sweats, fevers, chills,or lassitude.+fatigue  HEENT: No headaches, difficulty swallowing, tooth/dental problems, or sore throat. No  sneezing, itching, ear ache, nasal congestion, or post nasal drip CV:  No chest pain, orthopnea, PND, swelling in lower extremities Resp: +snoring. No shortness of breath with exertion or at rest. No cough GI:  No heartburn, indigestion GU: No nocturia  Skin: No rash, lesions, ulcerations MSK:  No joint pain or swelling.   Neuro: No dizziness or lightheadedness.  Psych: No depression or anxiety. Mood stable. +sleep disturbance     Physical Exam:  BP 132/74 (BP Location: Right Arm, Patient Position: Sitting, Cuff Size: Normal)   Pulse (!) 58   Ht 5\' 3"  (1.6 m)   Wt 140 lb 3.2 oz (63.6 kg)   SpO2 97%   BMI  24.84 kg/m   GEN: Pleasant, interactive, well-appearing; in no acute distress HEENT:  Normocephalic and atraumatic. PERRLA. Sclera white. Nasal turbinates pink, moist and patent bilaterally. No rhinorrhea present. Oropharynx pink and moist, without exudate or edema. No lesions, ulcerations, or postnasal drip. Mallampati II NECK:  Supple w/ fair ROM. Thyroid  symmetrical with no goiter or nodules palpated. No lymphadenopathy.   CV: RRR, no m/r/g, no peripheral edema. Pulses intact, +2 bilaterally. No cyanosis, pallor or clubbing. PULMONARY:  Unlabored, regular breathing. Clear bilaterally A&P w/o wheezes/rales/rhonchi. No accessory muscle use.  GI: BS present and normoactive. Soft, non-tender to palpation. No organomegaly or masses detected.  MSK: No erythema, warmth or tenderness. Cap refil <2 sec all extrem.   Neuro: A/Ox3. No focal deficits noted.   Skin: Warm, no lesions or rashe Psych: Normal affect and behavior. Judgement and thought content appropriate.     Lab Results:  CBC    Component Value Date/Time   WBC 9.6 06/24/2023 2241   RBC 3.58 (L) 06/24/2023 2241   HGB 11.3 (L) 06/24/2023 2241   HGB 10.5 (L) 03/06/2023 1629   HGB 9.5 (L) 08/01/2016 1527   HCT 32.9 (L) 06/24/2023 2241   HCT 31.7 (L) 03/06/2023 1629   HCT 31.2 (L) 08/01/2016 1527   PLT 188 06/24/2023  2241   PLT 213 03/06/2023 1629   MCV 91.9 06/24/2023 2241   MCV 96 03/06/2023 1629   MCV 83.9 08/01/2016 1527   MCH 31.6 06/24/2023 2241   MCHC 34.3 06/24/2023 2241   RDW 12.1 06/24/2023 2241   RDW 12.2 03/06/2023 1629   RDW 17.2 (H) 08/01/2016 1527   LYMPHSABS 1.4 06/24/2023 2241   LYMPHSABS 1.5 03/06/2023 1629   LYMPHSABS 2.0 08/01/2016 1527   MONOABS 1.0 06/24/2023 2241   MONOABS 0.4 08/01/2016 1527   EOSABS 0.1 06/24/2023 2241   EOSABS 0.0 03/06/2023 1629   BASOSABS 0.0 06/24/2023 2241   BASOSABS 0.0 03/06/2023 1629   BASOSABS 0.0 08/01/2016 1527    BMET    Component Value Date/Time   NA 139 06/24/2023 2241   NA 139 04/03/2023 1359   NA 141 08/01/2016 1527   K 3.5 06/24/2023 2241   K 4.6 08/01/2016 1527   CL 105 06/24/2023 2241   CO2 24 06/24/2023 2241   CO2 24 08/01/2016 1527   GLUCOSE 101 (H) 06/24/2023 2241   GLUCOSE 93 08/01/2016 1527   BUN 38 (H) 06/24/2023 2241   BUN 35 (H) 04/03/2023 1359   BUN 19.9 08/01/2016 1527   CREATININE 1.30 (H) 06/24/2023 2241   CREATININE 0.9 08/01/2016 1527   CALCIUM  9.3 06/24/2023 2241   CALCIUM  9.7 08/01/2016 1527   GFRNONAA 41 (L) 06/24/2023 2241   GFRAA 76 05/28/2019 1609    BNP No results found for: "BNP"   Imaging:  CT Head Wo Contrast Result Date: 06/24/2023 CLINICAL DATA:  Status post fall. EXAM: CT HEAD WITHOUT CONTRAST TECHNIQUE: Contiguous axial images were obtained from the base of the skull through the vertex without intravenous contrast. RADIATION DOSE REDUCTION: This exam was performed according to the departmental dose-optimization program which includes automated exposure control, adjustment of the mA and/or kV according to patient size and/or use of iterative reconstruction technique. COMPARISON:  June 07, 2019 FINDINGS: Brain: There is mild cerebral atrophy with widening of the extra-axial spaces and ventricular dilatation. There are areas of decreased attenuation within the white matter tracts of the  supratentorial brain, consistent with microvascular disease changes. Vascular: No hyperdense vessel or unexpected calcification.  Skull: Normal. Negative for fracture or focal lesion. Sinuses/Orbits: No acute finding. Other: None. IMPRESSION: No acute intracranial abnormality. Electronically Signed   By: Virgle Grime M.D.   On: 06/24/2023 23:31   DG Wrist Complete Right Result Date: 06/24/2023 CLINICAL DATA:  Pain after fall. EXAM: RIGHT WRIST - COMPLETE 3+ VIEW COMPARISON:  None Available. FINDINGS: There is no evidence of fracture or dislocation. Multifocal osteoarthritis, most prominently affecting the thumb carpal metacarpal joint. There is chondrocalcinosis of the triangular fibrocartilage. Soft tissues are unremarkable. IMPRESSION: 1. No fracture or dislocation of the right wrist. 2. Multifocal osteoarthritis. 3. Chondrocalcinosis of the triangular fibrocartilage. Electronically Signed   By: Chadwick Colonel M.D.   On: 06/24/2023 23:24   DG Foot Complete Left Result Date: 06/24/2023 CLINICAL DATA:  Left foot and ankle pain after fall. EXAM: LEFT FOOT - COMPLETE 3+ VIEW; LEFT ANKLE COMPLETE - 3+ VIEW COMPARISON:  None Available. FINDINGS: Foot: No acute fracture or dislocation. Mild osteoarthritis of the first metatarsal phalangeal joint. Mild talonavicular spurring. No erosions. Ankle: Possible minimally displaced avulsion fracture from the dorsal talus. No additional fracture of the ankle. The ankle mortise is preserved. Mild tibial talar degenerative change. Plantar calcaneal spur and Achilles tendon enthesophyte. No ankle joint effusion. There is generalized soft tissue edema. IMPRESSION: 1. Possible minimally displaced avulsion fracture from the dorsal talus. 2. No additional fracture of the foot or ankle. 3. Mild osteoarthritis of the first metatarsophalangeal joint and tibiotalar joint. Electronically Signed   By: Chadwick Colonel M.D.   On: 06/24/2023 23:23   DG Ankle Complete Left Result  Date: 06/24/2023 CLINICAL DATA:  Left foot and ankle pain after fall. EXAM: LEFT FOOT - COMPLETE 3+ VIEW; LEFT ANKLE COMPLETE - 3+ VIEW COMPARISON:  None Available. FINDINGS: Foot: No acute fracture or dislocation. Mild osteoarthritis of the first metatarsal phalangeal joint. Mild talonavicular spurring. No erosions. Ankle: Possible minimally displaced avulsion fracture from the dorsal talus. No additional fracture of the ankle. The ankle mortise is preserved. Mild tibial talar degenerative change. Plantar calcaneal spur and Achilles tendon enthesophyte. No ankle joint effusion. There is generalized soft tissue edema. IMPRESSION: 1. Possible minimally displaced avulsion fracture from the dorsal talus. 2. No additional fracture of the foot or ankle. 3. Mild osteoarthritis of the first metatarsophalangeal joint and tibiotalar joint. Electronically Signed   By: Chadwick Colonel M.D.   On: 06/24/2023 23:23   DG Shoulder Left Result Date: 06/24/2023 CLINICAL DATA:  Left shoulder pain after fall. EXAM: LEFT SHOULDER - 2+ VIEW COMPARISON:  01/24/2022 FINDINGS: There is no evidence of fracture or dislocation. The bones are subjectively under mineralized. Mild acromioclavicular and glenohumeral degenerative change. Small subacromial spur. Soft tissues are unremarkable. Left axillary surgical clips. IMPRESSION: 1. No fracture or dislocation of the left shoulder. 2. Mild acromioclavicular and glenohumeral degenerative change. Electronically Signed   By: Chadwick Colonel M.D.   On: 06/24/2023 23:21    Administration History     None           No data to display          No results found for: "NITRICOXIDE"      Assessment & Plan:   Mild obstructive sleep apnea Mild OSA. Reviewed minimal cardiovascular risks of mild OSA and potential treatment options. Given daytime symptoms and sleep disturbances, she would like to move forward with CPAP trial. Educated on proper use/care of device. Risks/benefits  reviewed. Orders placed for new start auto CPAP 5-15 cmH2O, mask of choice  and heated humidity. Safe driving practices reviewed.   Patient Instructions  Start CPAP every night, minimum of 4-6 hours a night.  Change equipment as directed. Wash your tubing with warm soap and water  daily, hang to dry. Wash humidifier portion weekly. Use bottled, distilled water  and change daily Be aware of reduced alertness and do not drive or operate heavy machinery if experiencing this or drowsiness.  Exercise encouraged, as tolerated. Healthy weight management discussed.  Avoid or decrease alcohol  consumption and medications that make you more sleepy, if possible. Notify if persistent daytime sleepiness occurs even with consistent use of PAP therapy.  Change supplies.... Every month Mask cushions and/or nasal pillows CPAP machine filters Every 3 months Mask frame (not including the headgear) CPAP tubing Every 6 months Mask headgear Chin strap (if applicable) Humidifier water  tub  We discussed how untreated sleep apnea puts an individual at risk for cardiac arrhthymias, pulm HTN, DM, stroke and increases their risk for daytime accidents. We also briefly reviewed treatment options including weight loss, side sleeping position, oral appliance, CPAP therapy or referral to ENT for possible surgical options  Follow up 10-12 weeks with Alston Jerry Beatric Fulop,NP, or sooner, if needed    Advised if symptoms do not improve or worsen, to please contact office for sooner follow up or seek emergency care.   I spent 35 minutes of dedicated to the care of this patient on the date of this encounter to include pre-visit review of records, face-to-face time with the patient discussing conditions above, post visit ordering of testing, clinical documentation with the electronic health record, making appropriate referrals as documented, and communicating necessary findings to members of the patients care team.  Roetta Clarke,  NP 07/23/2023  Pt aware and understands NP's role.

## 2023-07-23 NOTE — Assessment & Plan Note (Signed)
 Mild OSA. Reviewed minimal cardiovascular risks of mild OSA and potential treatment options. Given daytime symptoms and sleep disturbances, she would like to move forward with CPAP trial. Educated on proper use/care of device. Risks/benefits reviewed. Orders placed for new start auto CPAP 5-15 cmH2O, mask of choice and heated humidity. Safe driving practices reviewed.   Patient Instructions  Start CPAP every night, minimum of 4-6 hours a night.  Change equipment as directed. Wash your tubing with warm soap and water  daily, hang to dry. Wash humidifier portion weekly. Use bottled, distilled water  and change daily Be aware of reduced alertness and do not drive or operate heavy machinery if experiencing this or drowsiness.  Exercise encouraged, as tolerated. Healthy weight management discussed.  Avoid or decrease alcohol  consumption and medications that make you more sleepy, if possible. Notify if persistent daytime sleepiness occurs even with consistent use of PAP therapy.  Change supplies.... Every month Mask cushions and/or nasal pillows CPAP machine filters Every 3 months Mask frame (not including the headgear) CPAP tubing Every 6 months Mask headgear Chin strap (if applicable) Humidifier water  tub  We discussed how untreated sleep apnea puts an individual at risk for cardiac arrhthymias, pulm HTN, DM, stroke and increases their risk for daytime accidents. We also briefly reviewed treatment options including weight loss, side sleeping position, oral appliance, CPAP therapy or referral to ENT for possible surgical options  Follow up 10-12 weeks with Alston Jerry Zayquan Bogard,NP, or sooner, if needed

## 2023-07-24 LAB — CBC WITH DIFFERENTIAL/PLATELET
Basophils Absolute: 0 10*3/uL (ref 0.0–0.2)
Basos: 1 %
EOS (ABSOLUTE): 0.1 10*3/uL (ref 0.0–0.4)
Eos: 1 %
Hematocrit: 35.4 % (ref 34.0–46.6)
Hemoglobin: 11.6 g/dL (ref 11.1–15.9)
Immature Grans (Abs): 0 10*3/uL (ref 0.0–0.1)
Immature Granulocytes: 0 %
Lymphocytes Absolute: 1.2 10*3/uL (ref 0.7–3.1)
Lymphs: 19 %
MCH: 31 pg (ref 26.6–33.0)
MCHC: 32.8 g/dL (ref 31.5–35.7)
MCV: 95 fL (ref 79–97)
Monocytes Absolute: 0.6 10*3/uL (ref 0.1–0.9)
Monocytes: 9 %
Neutrophils Absolute: 4.5 10*3/uL (ref 1.4–7.0)
Neutrophils: 70 %
Platelets: 200 10*3/uL (ref 150–450)
RBC: 3.74 x10E6/uL — ABNORMAL LOW (ref 3.77–5.28)
RDW: 12.1 % (ref 11.7–15.4)
WBC: 6.4 10*3/uL (ref 3.4–10.8)

## 2023-07-24 LAB — LIPID PANEL
Chol/HDL Ratio: 3.8 ratio (ref 0.0–4.4)
Cholesterol, Total: 146 mg/dL (ref 100–199)
HDL: 38 mg/dL — ABNORMAL LOW (ref >39)
LDL Chol Calc (NIH): 79 mg/dL (ref 0–99)
Triglycerides: 170 mg/dL — ABNORMAL HIGH (ref 0–149)
VLDL Cholesterol Cal: 29 mg/dL (ref 5–40)

## 2023-07-24 LAB — COMPREHENSIVE METABOLIC PANEL WITH GFR
ALT: 14 IU/L (ref 0–32)
AST: 25 IU/L (ref 0–40)
Albumin: 4.3 g/dL (ref 3.7–4.7)
Alkaline Phosphatase: 52 IU/L (ref 44–121)
BUN/Creatinine Ratio: 23 (ref 12–28)
BUN: 27 mg/dL (ref 8–27)
Bilirubin Total: 0.4 mg/dL (ref 0.0–1.2)
CO2: 22 mmol/L (ref 20–29)
Calcium: 9.4 mg/dL (ref 8.7–10.3)
Chloride: 103 mmol/L (ref 96–106)
Creatinine, Ser: 1.19 mg/dL — ABNORMAL HIGH (ref 0.57–1.00)
Globulin, Total: 2.1 g/dL (ref 1.5–4.5)
Glucose: 92 mg/dL (ref 70–99)
Potassium: 3.8 mmol/L (ref 3.5–5.2)
Sodium: 140 mmol/L (ref 134–144)
Total Protein: 6.4 g/dL (ref 6.0–8.5)
eGFR: 46 mL/min/{1.73_m2} — ABNORMAL LOW (ref >59)

## 2023-07-24 LAB — PROTIME-INR
INR: 2.5 — ABNORMAL HIGH (ref 0.9–1.2)
Prothrombin Time: 26.9 s — ABNORMAL HIGH (ref 9.1–12.0)

## 2023-07-24 LAB — TSH: TSH: 2.16 u[IU]/mL (ref 0.450–4.500)

## 2023-07-24 NOTE — Progress Notes (Signed)
 Please call patient and let her know INR is stable. Recheck in 4 weeks to be scheduled. Her renal function and blood counts are stable and thyroid  is in range. We will discuss her labs at her visit on 07/31/2023.

## 2023-07-25 ENCOUNTER — Other Ambulatory Visit: Payer: Self-pay | Admitting: Psychiatry

## 2023-07-25 DIAGNOSIS — F5105 Insomnia due to other mental disorder: Secondary | ICD-10-CM

## 2023-07-25 DIAGNOSIS — G4721 Circadian rhythm sleep disorder, delayed sleep phase type: Secondary | ICD-10-CM

## 2023-07-26 ENCOUNTER — Telehealth (HOSPITAL_BASED_OUTPATIENT_CLINIC_OR_DEPARTMENT_OTHER): Payer: Self-pay

## 2023-07-26 ENCOUNTER — Telehealth (HOSPITAL_BASED_OUTPATIENT_CLINIC_OR_DEPARTMENT_OTHER): Payer: Self-pay | Admitting: *Deleted

## 2023-07-26 NOTE — Telephone Encounter (Signed)
 Copied from CRM [id number]. Topic: Clinical - Lab/Test Results >> Jul 26, 2023  3:41 PM Andrea Simmons wrote: Reason for CRM: Patient calling to review lab results. Relayed results per providers note, verbatim. Patient verbalized understanding, no additional questions at this time.

## 2023-07-26 NOTE — Telephone Encounter (Signed)
 noted

## 2023-07-26 NOTE — Telephone Encounter (Signed)
 Spoke with PCP CMA and she states pt should call [phone] to get results as PCP ordered

## 2023-07-27 ENCOUNTER — Telehealth: Payer: Self-pay | Admitting: Nurse Practitioner

## 2023-07-27 NOTE — Telephone Encounter (Signed)
 Cmn received from [facility] for CPAP and supplies.

## 2023-07-31 ENCOUNTER — Encounter (HOSPITAL_BASED_OUTPATIENT_CLINIC_OR_DEPARTMENT_OTHER): Payer: Self-pay | Admitting: Family Medicine

## 2023-07-31 ENCOUNTER — Encounter (HOSPITAL_BASED_OUTPATIENT_CLINIC_OR_DEPARTMENT_OTHER): Payer: Self-pay | Admitting: *Deleted

## 2023-07-31 ENCOUNTER — Ambulatory Visit (HOSPITAL_BASED_OUTPATIENT_CLINIC_OR_DEPARTMENT_OTHER): Admitting: Family Medicine

## 2023-07-31 VITALS — BP 122/70 | HR 65 | Ht 63.0 in | Wt 140.4 lb

## 2023-07-31 DIAGNOSIS — Z7901 Long term (current) use of anticoagulants: Secondary | ICD-10-CM | POA: Diagnosis not present

## 2023-07-31 DIAGNOSIS — L308 Other specified dermatitis: Secondary | ICD-10-CM

## 2023-07-31 DIAGNOSIS — L089 Local infection of the skin and subcutaneous tissue, unspecified: Secondary | ICD-10-CM | POA: Diagnosis not present

## 2023-07-31 DIAGNOSIS — L309 Dermatitis, unspecified: Secondary | ICD-10-CM | POA: Insufficient documentation

## 2023-07-31 DIAGNOSIS — N1831 Chronic kidney disease, stage 3a: Secondary | ICD-10-CM | POA: Insufficient documentation

## 2023-07-31 DIAGNOSIS — E039 Hypothyroidism, unspecified: Secondary | ICD-10-CM

## 2023-07-31 DIAGNOSIS — E782 Mixed hyperlipidemia: Secondary | ICD-10-CM | POA: Diagnosis not present

## 2023-07-31 DIAGNOSIS — E119 Type 2 diabetes mellitus without complications: Secondary | ICD-10-CM | POA: Diagnosis not present

## 2023-07-31 LAB — POCT UA - MICROALBUMIN
Albumin/Creatinine Ratio, Urine, POC: <30
Creatinine, POC: 100 mg/dL
Microalbumin Ur, POC: 10 mg/L

## 2023-07-31 MED ORDER — TRIAMCINOLONE ACETONIDE 0.1 % EX OINT: 1.0000 | TOPICAL_OINTMENT | Freq: Two times a day (BID) | CUTANEOUS | 3 refills | Status: AC

## 2023-07-31 MED ORDER — CEPHALEXIN 500 MG PO CAPS: 500.0000 mg | ORAL_CAPSULE | Freq: Four times a day (QID) | ORAL | 0 refills | Status: AC

## 2023-07-31 NOTE — Patient Instructions (Signed)
 Ophthalmology Offices   [facility]  [address]  [place], [place] [address] [phone]  [facility] Ophthalmology [address] [place], [place] [address] Phone: [phone]  [facility] [address] [place], [place] [address] Phone: [phone]  [facility]  Optometrist [address]  [phone]  [facility] Optometrist [address]  [phone]

## 2023-07-31 NOTE — Progress Notes (Unsigned)
 Subjective:   Andrea Simmons [birth year] 07/31/2023  Chief Complaint  Patient presents with   Medical Management of Chronic Issues    64-month follow up; patient states her left leg has been hurting and states there is an area that she believes is infected. States she has not slept that much within the past two days.    HPI: Andrea Simmons presents today for re-assessment and management of chronic medical conditions.  CHRONIC KIDNEY DISEASE: Andrea Simmons presents for the medical management of Chronic Kidney Disease Stage 3a.  She does see Nephrology Dr. Louis Row [facility]) every 6 months -12 months. She has had considerable improvement in her GFR with starting Farxiga  in Dec/Jan with GFR improved from 28.  Patient is  adhering to renal diet. Patient is  on ACE1/ARB therapy- Valsartan .  Patient is  avoiding NSAIDS.     Does not attend dialysis and does not perform peritoneal dialysis.   Lab Results  Component Value Date   NA 140 07/23/2023   K 3.8 07/23/2023   CO2 22 07/23/2023   GLUCOSE 92 07/23/2023   BUN 27 07/23/2023   CREATININE 1.19 (H) 07/23/2023   CALCIUM  9.4 07/23/2023   GFR 33.40 (L) 02/16/2022   EGFR 46 (L) 07/23/2023   GFRNONAA 41 (L) 06/24/2023     DIABETES MELLITUS: Andrea Simmons presents for the medical management of diabetes. She does see Dr. Ronelle Coffee with endocrinology at [facility].   A1C : 5.1 on 07/12/2023  Current diabetes medication regimen: Farxiga  5mg  Patient is  adhering to a diabetic diet.  Patient is  exercising regularly.  Patient is  checking their feet regularly.  Denies polydipsia, polyphagia, polyuria, open wounds or ulcers on feet.  Lab Results  Component Value Date   HGBA1C 5.3 03/06/2023    Foot Exam: 02/16/2022 Lab Results  Component Value Date   MICROALBUR 10 07/31/2023   MICROALBUR 1.6 02/16/2022    Wt Readings from Last 3 Encounters:  07/31/23 140 lb 6.4 oz (63.7 kg)  07/23/23 140 lb 3.2 oz  (63.6 kg)  04/03/23 138 lb (62.6 kg)     HYPERLIPIDEMIA: Andrea Simmons presents for the medical management of hyperlipidemia.  Patient's current HLD regimen is: Crestor  10mg   Patient is  currently taking prescribed medications for HLD.  Denies myalgias.  Lab Results  Component Value Date   CHOL 146 07/23/2023   HDL 38 (L) 07/23/2023   LDLCALC 79 07/23/2023   LDLDIRECT 83.0 08/01/2021   TRIG 170 (H) 07/23/2023   CHOLHDL 3.8 07/23/2023    LEG PAIN:  Patient reports small lesion to her left leg that has become significantly red, sore, and painful to the touch.  She reports mild edema to the left lower extremity.  Mild drainage from her small lesion.  She denies recent injury, accident, or bite that could have occurred and cause a lesion to form.  She denies fevers or chills.   RASH:  Patient is currently using Eucerin for eczema to her back. She states she is itching frequently and itching is worsening at nighttime.  She states she has scratched this area until she will bleed at times.  She reports history of eczema with improvement using steroid cream.  HYPOTHYROIDISM: Patient presents for the medical management of Hypothyroidism. Current medication: Levothyroxine  25mcg Patient compliant with medication regimen: yes  Fatigue: no Cold intolerance: no Weight gain/loss: no Constipation: no Lower extremity edema: no Palpitations: no Hoarseness: no Neck Pain/Compression: no  Difficulty Swallowing: no  Lab Results  Component Value Date   TSH 2.160 07/23/2023      The following portions of the patient's history were reviewed and updated as appropriate: past medical history, past surgical history, family history, social history, allergies, medications, and problem list.   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   Grade I diastolic dysfunction  04/04/2023   Acute venous embolism and thrombosis of deep vessels of proximal lower extremity (HCC) 12/04/2022   Snoring 11/13/2022   Depression, recurrent (HCC) 09/28/2022   Tear film insufficiency 04/19/2021   Sjogren's disease (HCC) 01/24/2021   Hereditary and idiopathic neuropathy, unspecified 12/10/2020   Osteoarthritis 12/10/2020   Esophageal dysphagia 09/16/2020   Vitamin D  deficiency 10/01/2019   Polypharmacy 10/01/2019   Major depressive disorder, recurrent episode, moderate (HCC) 10/01/2019   Hx of pulmonary embolus 03/30/2017   Hyperlipidemia 12/26/2013   Encounter for therapeutic drug monitoring 04/16/2013   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   Diabetes type 2, controlled (HCC) 08/27/2008   ANEMIA-NOS 08/27/2008   Seasonal and perennial allergic rhinitis 08/27/2008   Lung nodule 08/27/2008   Fibromyalgia 08/27/2008   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 (HCC) 03/20/2007   Hypothyroidism 03/20/2007   Essential hypertension 03/20/2007   Recurrent pulmonary emboli (HCC) 03/20/2007   GERD 03/20/2007   ESOPHAGEAL STRICTURE 03/08/2007   Past Medical History:  Diagnosis Date   Abnormal gait 12/10/2020   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) 05/15/2022   Anxiety    Breast cancer (HCC)    Chest pain of uncertain etiology 12/25/2018   Chronic osteoarthritis    CKD stage 3b, GFR 30-44 ml/min (HCC) 01/25/2021   Dehydration 09/16/2020   Depression    Diabetes mellitus type II    Diabetes type 2, controlled (HCC) 08/27/2008   Qualifier: Diagnosis of   By: Autry Legions MD, Alveda Aures        Elevated lipase 09/16/2020   Essential hypertension 03/20/2007   Qualifier: Diagnosis of   By: Rochelle Chu RN, CGRN, Sheri         Exertional dyspnea 11/09/2008   11/20/2017  Walked RA x 3 laps @ 185 ft each stopped due to  End of study, fast pace, no desat, min sob   - Echo 12/05/2017 - LVEF 60-65%, moderate LVH, normal wall motion, grade 1 DD,    indeterminate LV filling pressure, normal GLS at -21%, normal LA   size, trivial TR, RVSP 20 mmHg, normal IVC.           Fibromyalgia    GERD (gastroesophageal reflux disease)    Hyperlipidemia    Hypertension    Hypothyroidism    Leg edema 06/04/2019   Malignant neoplasm of breast (female), unspecified site 1993   L breast s/p mastectomy and tamoxifen x 74yrs   Osteoporosis    Other pulmonary embolism and infarction 2008 and 2009   chronic anticoag - [facility] CC   Past Surgical History:  Procedure Laterality Date   APPENDECTOMY     BALLOON DILATION N/A 10/18/2020   Procedure: BALLOON DILATION;  Surgeon: Vinetta Greening, DO;  [facility];  Service: Endoscopy;  Laterality: N/A;   BIOPSY  10/18/2020   Procedure: BIOPSY;  Surgeon: Vinetta Greening, DO;  [facility]: [facility];  Service: Endoscopy;;  gastric  BREAST BIOPSY Left 1993   BREAST BIOPSY Right 09/25/2014   stero. Benign   BREAST RECONSTRUCTION Left    CATARACT EXTRACTION     ESOPHAGOGASTRODUODENOSCOPY (EGD) WITH PROPOFOL  N/A 10/18/2020   Procedure: ESOPHAGOGASTRODUODENOSCOPY (EGD) WITH PROPOFOL ;  Surgeon: Vinetta Greening, DO;  Location: [facility];  Service: Endoscopy;  Laterality: N/A;  11:00am   MASTECTOMY Left    ROTATOR CUFF REPAIR     SPINAL FUSION      x 2   TOTAL ABDOMINAL HYSTERECTOMY W/ BILATERAL SALPINGOOPHORECTOMY     TUBAL LIGATION     Family History  Problem Relation Age of Onset   Prostate cancer Maternal Uncle    Hypertension Maternal Grandfather    Breast cancer Cousin    Breast cancer Cousin 2   Depression Other        Parent   Arthritis Other        Parent, Grandparent   Hypertension Other        Grandparent   Hyperlipidemia Other        FMH   Miscarriages / Stillbirths Other        Grandparent   Stroke Other        [facility]   Outpatient Medications Prior to Visit  Medication Sig Dispense Refill   ACCU-CHEK GUIDE test strip CHECK BLOOD SUGARS DAILY 100 strip 12   Accu-Chek  Softclix Lancets lancets USE AS INSTRUCTED TO TEST SUGARS TWICE A DAY 100 each 12   amLODipine  (NORVASC ) 10 MG tablet TAKE 1 TABLET BY MOUTH EVERY DAY 90 tablet 2   Biotin 1000 MCG tablet Take 1,000 mcg by mouth daily.     Blood Glucose Monitoring Suppl (ACCU-CHEK GUIDE) w/Device KIT 1 each by Does not apply route 2 (two) times daily. To test sugars. Dx. E11.9 1 kit 1   DULoxetine  (CYMBALTA ) 30 MG capsule Take 3 capsules (90 mg total) by mouth daily. 270 capsule 3   eszopiclone  (LUNESTA ) 2 MG TABS tablet TAKE 1 TABLET (2 MG TOTAL) BY MOUTH IMMEDIATELY BEFORE BEDTIME AS NEEDED FOR SLEEP 30 tablet 3   FARXIGA  5 MG TABS tablet TAKE 1 TABLET (5 MG TOTAL) BY MOUTH DAILY. 30 tablet 2   fenofibrate (TRICOR) 48 MG tablet Take 48 mg by mouth daily.     FLAREX 0.1 % ophthalmic suspension Apply to eye.     furosemide  (LASIX ) 20 MG tablet TAKE 1 TABLET BY MOUTH EVERY DAY AS NEEDED 90 tablet 2   hydrALAZINE  (APRESOLINE ) 100 MG tablet Take 0.5 tablets (50 mg total) by mouth 2 (two) times daily. 90 tablet 0   labetalol  (NORMODYNE ) 200 MG tablet TAKE 1 TABLET BY MOUTH TWICE A DAY 180 tablet 2   levothyroxine  (SYNTHROID ) 25 MCG tablet TAKE 1 TABLET BY MOUTH EVERY DAY BEFORE BREAKFAST 90 tablet 3   Magnesium  500 MG TABS Take 500 mg by mouth daily.     rosuvastatin  (CRESTOR ) 10 MG tablet TAKE 1 TABLET BY MOUTH EVERY DAY 90 tablet 1   valsartan  (DIOVAN ) 320 MG tablet TAKE 1 TABLET BY MOUTH EVERY DAY 90 tablet 2   warfarin (COUMADIN ) 5 MG tablet TAKE 1/2 TABLET (2.5 MG) BY MOUTH DAILY EXCEPT TAKE 1 TABLET (5 MG) ON MONDAYS, WEDNESDAYS AND SATURDAYS OR AS DIRECTED BY ANTICOAGULATION CLINIC 75 tablet 2   No facility-administered medications prior to visit.   Allergies  Allergen Reactions   Lipitor [Atorvastatin]     Unknown   Morphine  Nausea And Vomiting   Meperidine Nausea And Vomiting  Naproxen Sodium Nausea And Vomiting     ROS: A complete ROS was performed with pertinent positives/negatives noted in the  HPI. The remainder of the ROS are negative.    Objective:   Today's Vitals   07/31/23 1347  BP: 122/70  Pulse: 65  SpO2: 98%  Weight: 140 lb 6.4 oz (63.7 kg)  Height: 5\' 3"  (1.6 m)    Physical Exam          GENERAL: Well-appearing, in NAD. Well nourished.  SKIN: Pink, warm and dry.  Eczema-like rash present to upper back without drainage, plaque formation, or erythema.  Moderate erythema and single lesion with clear drainage present to left lower extremity.  Mild streaking present down left lower extremity from site of lesion. Head: Normocephalic. NECK: Trachea midline. Full ROM w/o pain or tenderness. RESPIRATORY: Chest wall symmetrical. Respirations even and non-labored. Breath sounds clear to auscultation bilaterally.  CARDIAC: S1, S2 present, regular rate and rhythm without murmur or gallops. Peripheral pulses 2+ bilaterally.  MSK: Muscle tone and strength appropriate for age.  EXTREMITIES: Without clubbing, cyanosis.  Mild +1 nonpitting edema to left lower extremity. NEUROLOGIC: No motor or sensory deficits. Steady, even gait. C2-C12 intact.  PSYCH/MENTAL STATUS: Alert, oriented x 3. Cooperative, appropriate mood and affect.   Health Maintenance Due  Topic Date Due   OPHTHALMOLOGY EXAM  02/15/2022   FOOT EXAM  02/17/2023    Results for orders placed or performed in visit on 07/31/23  POCT UA - Microalbumin  Result Value Ref Range   Microalbumin Ur, POC 10 mg/L   Creatinine, POC 100 mg/dL   Albumin/Creatinine Ratio, Urine, POC <30     The ASCVD Risk score (Arnett DK, et al., 2019) failed to calculate for the following reasons:   The 2019 ASCVD risk score is only valid for ages 54 to 60     Assessment & Plan:  1. CKD stage 3a, GFR 45-59 ml/min (HCC) (Primary) Controlled and significantly improved with starting Farxiga .  Will continue on current dosage.  Reviewed renal diet and avoidance of NSAIDs with patient.  Continue management with nephrology.  Will obtain  records from [facility] for review.  2. Controlled type 2 diabetes mellitus without complication, without long-term current use of insulin  (HCC) Well-controlled on diet, will obtain microalbumin with lab work today.  Discussed good dietary changes, regular exercise with patient and continue monitoring with endocrinology.  Will obtain records from [facility] medical endocrinology with Dr. Ballin. - POCT UA - Microalbumin  3. Mixed hyperlipidemia Well-controlled.  Continue current regimen  4. Skin infection Possible cellulitis from small skin infection and lesion to left lower extremity.  Start Keflex  1 capsule 4 times daily for 5 days.  Return in approximately 1 to 2 weeks or sooner if no improvement or worsening of infection. - cephALEXin  (KEFLEX ) 500 MG capsule; Take 1 capsule (500 mg total) by mouth 4 (four) times daily for 5 days.  Dispense: 20 capsule; Refill: 0  5. Other eczema Recommend starting triamcinolone  cream to affected areas of her back twice daily for at least 1 to 2 weeks until resolved. - triamcinolone  ointment (KENALOG) 0.1 %; Apply 1 Application topically 2 (two) times daily. To affected areas (Back)  Dispense: 60 g; Refill: 3  6. Acquired hypothyroidism Stable, continue current regimen of levothyroxine .  7. Chronic anticoagulation INR has been stable for 4 week intervals.  Will return in 4 weeks for INR check given chronic anticoagulation.  Meds ordered this encounter  Medications  triamcinolone  ointment (KENALOG) 0.1 %    Sig: Apply 1 Application topically 2 (two) times daily. To affected areas (Back)    Dispense:  60 g    Refill:  3    Supervising Provider:   DE [place], RAYMOND J [[id number]]   cephALEXin  (KEFLEX ) 500 MG capsule    Sig: Take 1 capsule (500 mg total) by mouth 4 (four) times daily for 5 days.    Dispense:  20 capsule    Refill:  0    Supervising Provider:   DE [place], RAYMOND J [[id number]]   Lab Orders         Protime-INR         POCT UA -  Microalbumin      Return in about 2 weeks (around 08/14/2023) for Follow up Cellulitis (20 min OV) .    Patient to reach out to office if new, worrisome, or unresolved symptoms arise or if no improvement in patient's condition. Patient verbalized understanding and is agreeable to treatment plan. All questions answered to patient's satisfaction.    Nonda Bays, [place]

## 2023-08-01 ENCOUNTER — Encounter: Payer: Self-pay | Admitting: Sports Medicine

## 2023-08-03 ENCOUNTER — Ambulatory Visit: Payer: Self-pay

## 2023-08-03 NOTE — Telephone Encounter (Signed)
  Chief Complaint: dry mouth Symptoms: dry mouth with antibiotic use Frequency:  Pertinent Negatives: Patient denies  Disposition: [] ED /[] Urgent Care (no appt availability in office) / [] Appointment(In office/virtual)/ []  [facility]/ [] Home Care/ [] Refused Recommended Disposition /[] [facility]/ [x]  Follow-up with PCP Additional Notes: patient having reaction to new medication reporting dry mouth and thrush.  RN advising to hold medication for now. Will route message HP for follow up. offered follow up with PCP for leg pain, patient refused and reports that she will go to [facility] for intervention today.  Copied from CRM [id number]. Topic: Clinical - Red Word Triage >> Aug 03, 2023  4:15 PM Andrea Simmons wrote: Red Word that prompted transfer to Nurse Triage: Patient has been taking a new medication and states the meds state to stop taking if seeming like getting a dry mouth or thrush. She states she's in bad shape and nothings bleeding or cracked but she is thinking its not far from it. Wanting to know if she should stop taking the medication. Pain on the left side stomach area recently, worried it has to do with her kidneys, which she already has issues with. Her leg is also giving her trouble and the arch of her foot both on the left side are causing her a great deal of pain. Reason for Disposition  Caller has medicine question, adult has minor symptoms, caller declines triage, AND triager answers question  Answer Assessment - Initial Assessment Questions 1. NAME of MEDICINE: "What medicine(s) are you calling about?"     cephalexin  2. QUESTION: "What is your question?" (e.g., double dose of medicine, side effect)     Mouth dry 3. PRESCRIBER: "Who prescribed the medicine?" Reason: if prescribed by specialist, call should be referred to that group.     Caudle, Alexis 4. SYMPTOMS: "Do you have any symptoms?" If Yes, ask: "What symptoms are you having?"  "How bad are the symptoms  (e.g., mild, moderate, severe)     Dry mouth  Protocols used: Medication Question Call-A-AH

## 2023-08-04 ENCOUNTER — Emergency Department (HOSPITAL_BASED_OUTPATIENT_CLINIC_OR_DEPARTMENT_OTHER)

## 2023-08-04 ENCOUNTER — Other Ambulatory Visit: Payer: Self-pay

## 2023-08-04 ENCOUNTER — Emergency Department (HOSPITAL_BASED_OUTPATIENT_CLINIC_OR_DEPARTMENT_OTHER): Admitting: Radiology

## 2023-08-04 ENCOUNTER — Emergency Department (HOSPITAL_BASED_OUTPATIENT_CLINIC_OR_DEPARTMENT_OTHER)
Admission: EM | Admit: 2023-08-04 | Discharge: 2023-08-04 | Disposition: A | Discharge status: Home / Self Care | Attending: Emergency Medicine | Admitting: Emergency Medicine

## 2023-08-04 DIAGNOSIS — M797 Fibromyalgia: Secondary | ICD-10-CM | POA: Diagnosis not present

## 2023-08-04 DIAGNOSIS — I129 Hypertensive chronic kidney disease with stage 1 through stage 4 chronic kidney disease, or unspecified chronic kidney disease: Secondary | ICD-10-CM | POA: Diagnosis not present

## 2023-08-04 DIAGNOSIS — M7989 Other specified soft tissue disorders: Secondary | ICD-10-CM | POA: Diagnosis not present

## 2023-08-04 DIAGNOSIS — Z853 Personal history of malignant neoplasm of breast: Secondary | ICD-10-CM | POA: Insufficient documentation

## 2023-08-04 DIAGNOSIS — M19072 Primary osteoarthritis, left ankle and foot: Secondary | ICD-10-CM | POA: Insufficient documentation

## 2023-08-04 DIAGNOSIS — Z7901 Long term (current) use of anticoagulants: Secondary | ICD-10-CM | POA: Diagnosis not present

## 2023-08-04 DIAGNOSIS — M25572 Pain in left ankle and joints of left foot: Secondary | ICD-10-CM | POA: Diagnosis present

## 2023-08-04 DIAGNOSIS — N1832 Chronic kidney disease, stage 3b: Secondary | ICD-10-CM | POA: Diagnosis not present

## 2023-08-04 DIAGNOSIS — M79605 Pain in left leg: Secondary | ICD-10-CM | POA: Diagnosis not present

## 2023-08-04 DIAGNOSIS — E1122 Type 2 diabetes mellitus with diabetic chronic kidney disease: Secondary | ICD-10-CM | POA: Insufficient documentation

## 2023-08-04 DIAGNOSIS — M2012 Hallux valgus (acquired), left foot: Secondary | ICD-10-CM | POA: Diagnosis not present

## 2023-08-04 DIAGNOSIS — E039 Hypothyroidism, unspecified: Secondary | ICD-10-CM | POA: Diagnosis not present

## 2023-08-04 DIAGNOSIS — S81802A Unspecified open wound, left lower leg, initial encounter: Secondary | ICD-10-CM | POA: Diagnosis not present

## 2023-08-04 LAB — PROTIME-INR
INR: 2.9 — ABNORMAL HIGH (ref 0.8–1.2)
Prothrombin Time: 30.8 s — ABNORMAL HIGH (ref 11.4–15.2)

## 2023-08-04 NOTE — ED Triage Notes (Signed)
 Pt POV ambulatory to room reporting persistent left leg pain, hx fibromyalgia and sciatica. Pt also reporting increased redness of wound on L leg, was prescribed abx but stopped taking due to dry mouth.

## 2023-08-04 NOTE — ED Provider Notes (Signed)
 [place] [facility] AT [facility] Provider Note  CSN: [id number] Arrival date & time: 08/04/23 1700  Chief Complaint(s) Leg Pain  HPI Andrea Simmons is a 83 y.o. female who is here today for left ankle pain.  Patient states that she has had issues with this ankle for several years, over the last 1 week it has been worse.  Patient was also recently treated for an infection on her left lateral tibia that she says is improving.  Patient is on warfarin, is compliant with her medications.   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) 05/15/2022   Anxiety    Breast cancer (HCC)    Chest pain of uncertain etiology 12/25/2018   Chronic osteoarthritis    CKD stage 3b, GFR 30-44 ml/min (HCC) 01/25/2021   Dehydration 09/16/2020   Depression    Diabetes mellitus type II    Diabetes type 2, controlled (HCC) 08/27/2008   Qualifier: Diagnosis of   By: Autry Legions MD, Alveda Aures        Elevated lipase 09/16/2020   Essential hypertension 03/20/2007   Qualifier: Diagnosis of   By: Rochelle Chu RN, CGRN, Sheri         Exertional dyspnea 11/09/2008   11/20/2017  Walked RA x 3 laps @ 185 ft each stopped due to  End of study, fast pace, no desat, min sob   - Echo 12/05/2017 - LVEF 60-65%, moderate LVH, normal wall motion, grade 1 DD,   indeterminate LV filling pressure, normal GLS at -21%, normal LA   size, trivial TR, RVSP 20 mmHg, normal IVC.           Fibromyalgia    GERD (gastroesophageal reflux disease)    Hyperlipidemia    Hypertension    Hypothyroidism    Leg edema 06/04/2019   Malignant neoplasm of breast (female), unspecified site 1993   L breast s/p mastectomy and tamoxifen x 28yrs   Osteoporosis    Other pulmonary embolism and infarction 2008 and 2009   chronic anticoag - LeB CC   Patient Active Problem List   Diagnosis Date Noted   Eczema 07/31/2023   CKD stage 3a, GFR 45-59  ml/min (HCC) 07/31/2023   Mild obstructive sleep apnea 07/23/2023   Grade I diastolic dysfunction 04/04/2023   Acute venous embolism and thrombosis of deep vessels of proximal lower extremity (HCC) 12/04/2022   Snoring 11/13/2022   Depression, recurrent (HCC) 09/28/2022   Tear film insufficiency 04/19/2021   Sjogren's disease (HCC) 01/24/2021   Hereditary and idiopathic neuropathy, unspecified 12/10/2020   Osteoarthritis 12/10/2020   Esophageal dysphagia 09/16/2020   Vitamin D  deficiency 10/01/2019   Polypharmacy 10/01/2019   Major depressive disorder, recurrent episode, moderate (HCC) 10/01/2019   Hx of pulmonary embolus 03/30/2017   Hyperlipidemia 12/26/2013   Encounter for therapeutic drug monitoring 04/16/2013   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 Authorizing Provider  ACCU-CHEK GUIDE test  strip CHECK BLOOD SUGARS DAILY 12/19/21   Tabori, Katherine E, MD  Accu-Chek Softclix Lancets lancets USE AS INSTRUCTED TO TEST SUGARS TWICE A DAY 12/13/22   Tabori, Katherine E, MD  amLODipine  (NORVASC ) 10 MG tablet TAKE 1 TABLET BY MOUTH EVERY DAY 05/22/23   [name], Kaye Parsons, MD  Biotin 1000 MCG tablet Take 1,000 mcg by mouth daily.    [provider]  Blood Glucose Monitoring Suppl (ACCU-CHEK GUIDE) w/Device KIT 1 each by Does not apply route 2 (two) times daily. To test sugars. Dx. E11.9 06/23/19   Tabori, Katherine E, MD  cephALEXin  (KEFLEX ) 500 MG capsule Take  1 capsule (500 mg total) by mouth 4 (four) times daily for 5 days. 07/31/23 08/05/23  CaudleArcola Kocher, FNP  DULoxetine  (CYMBALTA ) 30 MG capsule Take 3 capsules (90 mg total) by mouth daily. 01/17/23   [name], Kennedy Peabody., MD  eszopiclone  (LUNESTA ) 2 MG TABS tablet TAKE 1 TABLET (2 MG TOTAL) BY MOUTH IMMEDIATELY BEFORE BEDTIME AS NEEDED FOR SLEEP 07/25/23   [name], Kennedy Peabody., MD  FARXIGA  5 MG TABS tablet TAKE 1 TABLET (5 MG TOTAL) BY MOUTH DAILY. 05/21/23   Caudle, Arcola Kocher, FNP  fenofibrate (TRICOR) 48 MG tablet Take 48 mg by mouth daily. 01/06/21   [provider]  FLAREX 0.1 % ophthalmic suspension Apply to eye. 02/09/21   [provider]  furosemide  (LASIX ) 20 MG tablet TAKE 1 TABLET BY MOUTH EVERY DAY AS NEEDED 05/22/23   [name], Kaye Parsons, MD  hydrALAZINE  (APRESOLINE ) 100 MG tablet Take 0.5 tablets (50 mg total) by mouth 2 (two) times daily. 03/06/23   Butler, Kristina, FNP  labetalol  (NORMODYNE ) 200 MG tablet TAKE 1 TABLET BY MOUTH TWICE A DAY 05/22/23   [name], [name], MD  levothyroxine  (SYNTHROID ) 25 MCG tablet TAKE 1 TABLET BY MOUTH EVERY DAY BEFORE BREAKFAST 04/11/23   Caudle, Arcola Kocher, FNP  Magnesium  500 MG TABS Take 500 mg by mouth daily.    [provider]  rosuvastatin  (CRESTOR ) 10 MG tablet TAKE 1 TABLET BY MOUTH EVERY DAY 05/21/23   Caudle, Arcola Kocher, FNP  triamcinolone  ointment (KENALOG ) 0.1 % Apply 1 Application topically 2 (two) times daily. To affected areas (Back) 07/31/23   Caudle, Arcola Kocher, FNP  valsartan  (DIOVAN ) 320 MG tablet TAKE 1 TABLET BY MOUTH EVERY DAY 08/31/22   [name], [name], MD  warfarin (COUMADIN ) 5 MG tablet TAKE 1/2 TABLET (2.5 MG) BY MOUTH DAILY EXCEPT TAKE 1 TABLET (5 MG) ON MONDAYS, WEDNESDAYS AND SATURDAYS OR AS DIRECTED BY ANTICOAGULATION CLINIC 03/06/23   Wilhelmena Hanson, FNP                                                                                                                                     Past Surgical History Past Surgical History:  Procedure Laterality Date   APPENDECTOMY     BALLOON DILATION N/A 10/18/2020  Procedure: BALLOON DILATION;  Surgeon: Vinetta Greening, DO;  Location: [facility];  Service: Endoscopy;  Laterality: N/A;   BIOPSY  10/18/2020   Procedure: BIOPSY;  Surgeon: Vinetta Greening, DO;  Location: [facility];  Service: Endoscopy;;  gastric    BREAST BIOPSY Left 1993   BREAST BIOPSY Right 09/25/2014   stero. Benign   BREAST RECONSTRUCTION Left    CATARACT EXTRACTION     ESOPHAGOGASTRODUODENOSCOPY (EGD) WITH PROPOFOL  N/A 10/18/2020   Procedure: ESOPHAGOGASTRODUODENOSCOPY (EGD) WITH PROPOFOL ;  Surgeon: Vinetta Greening, DO;  Location: [facility];  Service: Endoscopy;  Laterality: N/A;  11:00am   MASTECTOMY Left    ROTATOR CUFF REPAIR     SPINAL FUSION      x 2   TOTAL ABDOMINAL HYSTERECTOMY W/ BILATERAL SALPINGOOPHORECTOMY     TUBAL LIGATION     Family History Family History  Problem Relation Age of Onset   Prostate cancer Maternal Uncle    Hypertension Maternal Grandfather    Breast cancer Cousin    Breast cancer Cousin 67   Depression Other        Parent   Arthritis Other        Parent, Grandparent   Hypertension Other        Grandparent   Hyperlipidemia Other        FMH   Miscarriages / Stillbirths Other        Grandparent   Stroke Other        [facility]    Social History Social History   Tobacco Use   Smoking status: Never    Passive exposure: Never   Smokeless tobacco: Never  Vaping Use   Vaping status: Never Used  Substance Use Topics   Alcohol  use: No    Alcohol /week: 0.0 standard drinks of alcohol    Drug use: No   Allergies Lipitor [atorvastatin], Morphine , Meperidine, and Naproxen sodium  Review of Systems Review of Systems  Physical Exam Vital Signs  I have reviewed the triage vital signs BP (!) 156/86 (BP Location: Right Arm)   Pulse (!) 56   Temp 98 F (36.7 C) (Oral)   Resp 18   Ht 5\' 2"  (1.575 m)    Wt 61.7 kg   SpO2 97%   BMI 24.87 kg/m   Physical Exam Vitals and nursing note reviewed.  Constitutional:      General: She is not in acute distress. Cardiovascular:     Rate and Rhythm: Normal rate.     Pulses: Normal pulses.     Comments: Palpable DP and PT pulses in the left leg Musculoskeletal:     Comments: Patient with tenderness in the left ankle, left distal tibia.  There is no crepitus, no erythema no deformity.  Patient has a small ulceration on the anterior tibia that appears well-healed, no surrounding erythema or crepitus.  Neurological:     Mental Status: She is alert.     Gait: Gait normal.     ED Results and Treatments Labs (all labs ordered are listed, but only abnormal results are displayed) Labs Reviewed  [facility]  Radiology DG Foot Complete Left Result Date: 08/04/2023 CLINICAL DATA:  History of fibromyalgia and sciatica. Left leg pain. Increasing redness on wound on left leg. EXAM: LEFT ANKLE COMPLETE - 3+ VIEW; LEFT FOOT - COMPLETE 3+ VIEW COMPARISON:  Left ankle and foot radiographs 06/24/2023 FINDINGS: Left ankle for 9 ankle mortise is symmetric and intact. Moderate distal medial malleolar and adjacent medial talar degenerative osteophytes. Mild distal anterior tibial plafond degenerative osteophytosis. Unchanged mild mineralization dorsal to the talar neck, chronic. Small plantar and mild mitral posterior calcaneal heel spurs. Mild dorsal tarsometatarsal degenerative osteophytosis. Left foot: Mild-to-moderate hallux valgus. Moderate great toe metatarsophalangeal joint space narrowing and peripheral osteophytosis. Mild fourth tarsometatarsal joint space narrowing. No acute fracture or dislocation. IMPRESSION: 1. Mild-to-moderate hallux valgus. 2. Moderate great toe metatarsophalangeal joint osteoarthritis. 3. Mild medial ankle osteoarthritis.  4. Unchanged mineralization just dorsal to the talar neck, possibly a now remote talonavicular ligament avulsion. Electronically Signed   By: Bertina Broccoli M.D.   On: 08/04/2023 18:10   DG Ankle Complete Left Result Date: 08/04/2023 CLINICAL DATA:  History of fibromyalgia and sciatica. Left leg pain. Increasing redness on wound on left leg. EXAM: LEFT ANKLE COMPLETE - 3+ VIEW; LEFT FOOT - COMPLETE 3+ VIEW COMPARISON:  Left ankle and foot radiographs 06/24/2023 FINDINGS: Left ankle for 9 ankle mortise is symmetric and intact. Moderate distal medial malleolar and adjacent medial talar degenerative osteophytes. Mild distal anterior tibial plafond degenerative osteophytosis. Unchanged mild mineralization dorsal to the talar neck, chronic. Small plantar and mild mitral posterior calcaneal heel spurs. Mild dorsal tarsometatarsal degenerative osteophytosis. Left foot: Mild-to-moderate hallux valgus. Moderate great toe metatarsophalangeal joint space narrowing and peripheral osteophytosis. Mild fourth tarsometatarsal joint space narrowing. No acute fracture or dislocation. IMPRESSION: 1. Mild-to-moderate hallux valgus. 2. Moderate great toe metatarsophalangeal joint osteoarthritis. 3. Mild medial ankle osteoarthritis. 4. Unchanged mineralization just dorsal to the talar neck, possibly a now remote talonavicular ligament avulsion. Electronically Signed   By: Bertina Broccoli M.D.   On: 08/04/2023 18:10    Pertinent labs & imaging results that were available during my care of the patient were reviewed by me and considered in my medical decision making (see MDM for details).  Medications Ordered in ED Medications - No data to display                                                                                                                                   Procedures Procedures  (including critical care time)  Medical Decision Making / ED Course   This patient presents to the ED for concern of left leg  pain and ankle pain, this involves an extensive number of treatment options, and is a complaint that carries with it a high risk of complications and morbidity.  The differential diagnosis includes osteoarthritis, DVT, less likely fracture, less likely arterial injury.  MDM: On exam, patient has a normal neurological exam.  She has brisk  pulses.  Will check an INR and get a DVT ultrasound of the patient.  Will obtain plain films of the patient's foot and ankle.  Based on her descriptions of an aching pain in that ankle joint and distal tibia, my suspicion is this is likely osteoarthritis.  Will assess for an acute cause today.  Reassessment 7:30 PM-negative DVT ultrasound study.  Her plain films are consistent with osteoarthritis.  I believe this is the cause of the patient's pain.  Discussed this with the patient, she will follow-up with her PCP.  INR within normal range.  Additional history obtained: -Additional history obtained from  -External records from outside source obtained and reviewed including: Chart review including previous notes, labs, imaging, consultation notes   Lab Tests: -I ordered, reviewed, and interpreted labs.   The pertinent results include:   Labs Reviewed  PROTIME-INR         Imaging Studies ordered: I ordered imaging studies including DVT ultrasound, plain films of the foot and ankle I independently visualized and interpreted imaging. I agree with the radiologist interpretation   Medicines ordered and prescription drug management: No orders of the defined types were placed in this encounter.   -I have reviewed the patients home medicines and have made adjustments as needed   Cardiac Monitoring: The patient was maintained on a cardiac monitor.  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) 05/15/2022   Anxiety    Breast cancer (HCC)    Chest pain of uncertain etiology 12/25/2018   Chronic osteoarthritis    CKD stage 3b, GFR 30-44 ml/min (HCC) 01/25/2021   Dehydration 09/16/2020   Depression    Diabetes mellitus type II    Diabetes type 2, controlled (HCC) 08/27/2008   Qualifier: Diagnosis of   By: Autry Legions MD, Alveda Aures        Elevated lipase 09/16/2020   Essential hypertension 03/20/2007   Qualifier: Diagnosis of   By: Rochelle Chu RN, CGRN, Sheri         Exertional dyspnea 11/09/2008   11/20/2017  Walked RA x 3 laps @ 185 ft each stopped due to  End of study, fast pace, no desat, min sob   - Echo 12/05/2017 - LVEF 60-65%, moderate LVH, normal wall motion, grade 1 DD,   indeterminate LV filling pressure, normal GLS at -21%, normal LA   size, trivial TR, RVSP 20 mmHg, normal IVC.           Fibromyalgia    GERD (gastroesophageal reflux disease)    Hyperlipidemia    Hypertension    Hypothyroidism    Leg edema 06/04/2019   Malignant neoplasm of breast (female), unspecified site 1993   L breast s/p mastectomy and tamoxifen x 56yrs   Osteoporosis    Other pulmonary embolism and infarction 2008 and 2009   chronic anticoag - LeB CC      Dispostion: I considered admission for this patient, however the patient is appropriate for outpatient follow-up.     Final Clinical Impression(s) / ED Diagnoses Final diagnoses:  None     @  Collins Dean    Afton Horse T, DO 08/04/23 2352

## 2023-08-04 NOTE — Discharge Instructions (Signed)
 Your ultrasound of your knee was negative.  Your INR was 2.9.  Your x-ray does show that you have arthritis in that ankle.  This is likely causing your pain.  You can take Tylenol  for this.  Please follow-up with your primary care doctor.

## 2023-08-06 ENCOUNTER — Ambulatory Visit: Admitting: Sports Medicine

## 2023-08-07 ENCOUNTER — Ambulatory Visit: Admitting: Dermatology

## 2023-08-07 ENCOUNTER — Encounter: Payer: Self-pay | Admitting: Dermatology

## 2023-08-07 VITALS — BP 120/61 | HR 68

## 2023-08-07 DIAGNOSIS — D0471 Carcinoma in situ of skin of right lower limb, including hip: Secondary | ICD-10-CM

## 2023-08-07 DIAGNOSIS — D049 Carcinoma in situ of skin, unspecified: Secondary | ICD-10-CM

## 2023-08-07 DIAGNOSIS — Z86007 Personal history of in-situ neoplasm of skin: Secondary | ICD-10-CM

## 2023-08-07 MED ORDER — FLUOROURACIL 5 % EX CREA: TOPICAL_CREAM | Freq: Two times a day (BID) | CUTANEOUS | 2 refills | Status: AC

## 2023-08-07 NOTE — Progress Notes (Signed)
   Follow-Up Visit   Subjective  Andrea Simmons is a 83 y.o. 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 Patient has records showing previous EDC treatment for [facility], several years ago. She states that she has not had regrowth of the cutaneous horn since her last visit.   The following portions of the chart were reviewed this encounter and updated as appropriate: medications, allergies, medical history  Review of Systems:  No other skin or systemic complaints except as noted in HPI or Assessment and Plan.  Objective  Well appearing patient in no apparent distress; mood and affect are within normal limits.   A focused examination was performed of the following areas: Right foot  Relevant exam findings are noted in the Assessment and Plan.    Assessment & Plan   HISTORY OF SQUAMOUS CELL CARCINOMA IN SITU OF THE SKIN - No evidence of recurrence today - Recommend regular full body skin exams - Recommend daily broad spectrum sunscreen SPF 30+ to sun-exposed areas, reapply every 2 hours as needed.  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 Apply 2x daily to right foot toes for 6 weeks. Apply Efudex to all toes on the right foot 2x a day for 6 weeks.  Return in about 2 months (around 10/07/2023) for F/U .  I, Haig Levan, Surg Tech III, am acting as scribe for Deneise Finlay, MD.   Documentation: I have reviewed the above documentation for accuracy and completeness, and I agree with the above.  Deneise Finlay, MD

## 2023-08-07 NOTE — Patient Instructions (Signed)
 Important Information  Due to recent changes in healthcare laws, you may see results of your pathology and/or laboratory studies on MyChart before the doctors have had a chance to review them. We understand that in some cases there may be results that are confusing or concerning to you. Please understand that not all results are received at the same time and often the doctors may need to interpret multiple results in order to provide you with the best plan of care or course of treatment. Therefore, we ask that you please give us  2 business days to thoroughly review all your results before contacting the office for clarification. Should we see a critical lab result, you will be contacted sooner.   If You Need Anything After Your Visit  If you have any questions or concerns for your doctor, please call our main line at [phone] If no one answers, please leave a voicemail as directed and we will return your call as soon as possible. Messages left after 4 pm will be answered the following business day.   You may also send us  a message via MyChart. We typically respond to MyChart messages within 1-2 business days.  For prescription refills, please ask your pharmacy to contact our office. Our fax number is [phone].  If you have an urgent issue when the clinic is closed that cannot wait until the next business day, you can page your doctor at the number below.    Please note that while we do our best to be available for urgent issues outside of office hours, we are not available 24/7.   If you have an urgent issue and are unable to reach us , you may choose to seek medical care at your doctor's office, retail clinic, urgent care center, or emergency room.  If you have a medical emergency, please immediately call 911 or go to the emergency department. In the event of inclement weather, please call our main line at [phone] for an update on the status of any delays or  closures.  Dermatology Medication Tips: Please keep the boxes that topical medications come in in order to help keep track of the instructions about where and how to use these. Pharmacies typically print the medication instructions only on the boxes and not directly on the medication tubes.   If your medication is too expensive, please contact our office at [phone] or send us  a message through MyChart.   We are unable to tell what your co-pay for medications will be in advance as this is different depending on your insurance coverage. However, we may be able to find a substitute medication at lower cost or fill out paperwork to get insurance to cover a needed medication.   If a prior authorization is required to get your medication covered by your insurance company, please allow us  1-2 business days to complete this process.  Drug prices often vary depending on where the prescription is filled and some pharmacies may offer cheaper prices.  The website www.goodrx.com contains coupons for medications through different pharmacies. The prices here do not account for what the cost may be with help from insurance (it may be cheaper with your insurance), but the website can give you the price if you did not use any insurance.  - You can print the associated coupon and take it with your prescription to the pharmacy.  - You may also stop by our office during regular business hours and pick up a GoodRx coupon card.  - If  you need your prescription sent electronically to a different pharmacy, notify our office through [facility] or by phone at [phone]    Skin Education :   I counseled the patient regarding the following: Sun screen (SPF 30 or greater) should be applied during peak UV exposure (between 10am and 2pm) and reapplied after exercise or swimming.  The ABCDEs of melanoma were reviewed with the patient, and the importance of monthly self-examination of moles was emphasized.  Should any moles change in shape or color, or itch, bleed or burn, pt will contact our office for evaluation sooner then their interval appointment.  Plan: Sunscreen Recommendations I recommended a broad spectrum sunscreen with a SPF of 30 or higher. I explained that SPF 30 sunscreens block approximately 97 percent of the sun's harmful rays. Sunscreens should be applied at least 15 minutes prior to expected sun exposure and then every 2 hours after that as long as sun exposure continues. If swimming or exercising sunscreen should be reapplied every 45 minutes to an hour after getting wet or sweating. One ounce, or the equivalent of a shot glass full of sunscreen, is adequate to protect the skin not covered by a bathing suit. I also recommended a lip balm with a sunscreen as well. Sun protective clothing can be used in lieu of sunscreen but must be worn the entire time you are exposed to the sun's rays. - Start 5-fluorouracil cream twice a day for 42 days to affected areas including right foot on toes.  Reviewed course of treatment and expected reaction.  Patient advised to expect inflammation and crusting and advised that erosions are possible.  Patient advised to be diligent with sun protection during and after treatment. Handout with details of how to apply medication and what to expect provided. Counseled to keep medication out of reach of children and pets.

## 2023-08-07 NOTE — Telephone Encounter (Signed)
 Attempted to call pt but unable to reach and unable to leave VM as no machine kicked in. Will try to call back later.

## 2023-08-10 NOTE — Telephone Encounter (Signed)
 CMN faxed successfully and signed.

## 2023-08-22 ENCOUNTER — Other Ambulatory Visit: Payer: Self-pay | Admitting: Family Medicine

## 2023-08-22 ENCOUNTER — Other Ambulatory Visit (HOSPITAL_BASED_OUTPATIENT_CLINIC_OR_DEPARTMENT_OTHER): Payer: Self-pay | Admitting: Family Medicine

## 2023-08-22 DIAGNOSIS — I10 Essential (primary) hypertension: Secondary | ICD-10-CM

## 2023-08-23 ENCOUNTER — Other Ambulatory Visit (HOSPITAL_BASED_OUTPATIENT_CLINIC_OR_DEPARTMENT_OTHER): Payer: Self-pay | Admitting: Family Medicine

## 2023-08-23 ENCOUNTER — Encounter (HOSPITAL_BASED_OUTPATIENT_CLINIC_OR_DEPARTMENT_OTHER): Payer: Self-pay | Admitting: Family Medicine

## 2023-08-23 ENCOUNTER — Ambulatory Visit (HOSPITAL_BASED_OUTPATIENT_CLINIC_OR_DEPARTMENT_OTHER): Admitting: Family Medicine

## 2023-08-23 ENCOUNTER — Ambulatory Visit (HOSPITAL_BASED_OUTPATIENT_CLINIC_OR_DEPARTMENT_OTHER): Admitting: Family

## 2023-08-23 ENCOUNTER — Other Ambulatory Visit (HOSPITAL_COMMUNITY): Payer: Self-pay | Admitting: Family Medicine

## 2023-08-23 ENCOUNTER — Encounter (HOSPITAL_BASED_OUTPATIENT_CLINIC_OR_DEPARTMENT_OTHER): Payer: Self-pay | Admitting: Family

## 2023-08-23 VITALS — BP 125/67 | HR 55 | Ht 62.0 in | Wt 142.0 lb

## 2023-08-23 VITALS — BP 124/64 | HR 64 | Ht 62.0 in | Wt 140.3 lb

## 2023-08-23 DIAGNOSIS — I10 Essential (primary) hypertension: Secondary | ICD-10-CM

## 2023-08-23 DIAGNOSIS — L089 Local infection of the skin and subcutaneous tissue, unspecified: Secondary | ICD-10-CM

## 2023-08-23 DIAGNOSIS — G4733 Obstructive sleep apnea (adult) (pediatric): Secondary | ICD-10-CM

## 2023-08-23 DIAGNOSIS — F5101 Primary insomnia: Secondary | ICD-10-CM

## 2023-08-23 DIAGNOSIS — Z7901 Long term (current) use of anticoagulants: Secondary | ICD-10-CM | POA: Diagnosis not present

## 2023-08-23 MED ORDER — HYDRALAZINE HCL 50 MG PO TABS: 50.0000 mg | ORAL_TABLET | Freq: Two times a day (BID) | ORAL | 1 refills | Status: AC

## 2023-08-23 MED ORDER — DOXEPIN HCL 3 MG PO TABS
3.0000 mg | ORAL_TABLET | Freq: Every evening | ORAL | 3 refills | Status: DC | PRN
Start: 2023-08-23 — End: 2023-09-17

## 2023-08-23 NOTE — Progress Notes (Signed)
 [facility] Assessment:    Date:  08/23/2023   ID:  Andrea Simmons, DOB Oct 20, 1940, MRN [id number]  PCP:  Nonda Bays, FNP  Cardiologist:  None  Nephrologist:  Referring MD: Wilhelmena Hanson, FNP   CC: Hypertension  History of Present Illness:    Andrea Simmons is a 83 y.o. 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   Prior 01/2022 normal TSH. Established with Dr. Theodis Fiscal and [facility] 11/2022. Noted BP at home were far higher than they were in clinic. 24h BP cuff ordered but not performed. Labs were negative for pheochromocytoma. Cortisol, renin-aldosterone were unremarkable.   Presents today for follow up independently. Feeling overall well from cardiac perspective. She does have chronic pain related to fibromyalgia. Reports her BP at home has been persistently elevated though checking in the morning prior to her medications. Continues to have difficulty sleeping. PCP added additional medication today. Has not yet picked up CPAP, encouraged to do so. Her husband remains at [place] long term care and she visits him as often as possible.   Previous antihypertensives:   Past Medical History:  Diagnosis Date   Abnormal gait 12/10/2020   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) 05/15/2022   Anxiety    Breast cancer (HCC)    Chest pain of uncertain etiology 12/25/2018   Chronic osteoarthritis    CKD stage 3b, GFR 30-44 ml/min (HCC) 01/25/2021   Dehydration 09/16/2020   Depression    Diabetes mellitus type II    Diabetes type 2, controlled (HCC) 08/27/2008   Qualifier: Diagnosis of   By: Autry Legions MD, Alveda Aures        Elevated lipase 09/16/2020   Essential hypertension 03/20/2007   Qualifier: Diagnosis of   By: Rochelle Chu RN, CGRN, Sheri         Exertional dyspnea 11/09/2008   11/20/2017  Walked RA x 3  laps @ 185 ft each stopped due to  End of study, fast pace, no desat, min sob   - Echo 12/05/2017 - LVEF 60-65%, moderate LVH, normal wall motion, grade 1 DD,   indeterminate LV filling pressure, normal GLS at -21%, normal LA   size, trivial TR, RVSP 20 mmHg, normal IVC.           Fibromyalgia    GERD (gastroesophageal reflux disease)    Hyperlipidemia    Hypertension    Hypothyroidism    Leg edema 06/04/2019   Malignant neoplasm of breast (female), unspecified site 1993   L breast s/p mastectomy and tamoxifen x 19yrs   Osteoporosis    Other pulmonary embolism and infarction 2008 and 2009   chronic anticoag - [facility]    Past Surgical History:  Procedure Laterality Date   APPENDECTOMY     BALLOON DILATION N/A 10/18/2020   Procedure: BALLOON DILATION;  Surgeon: Vinetta Greening, DO;  [facility];  Service: Endoscopy;  Laterality: N/A;   BIOPSY  10/18/2020   Procedure: BIOPSY;  Surgeon: Vinetta Greening, DO;  [facility];  Service: Endoscopy;;  gastric    BREAST BIOPSY Left 1993   BREAST BIOPSY Right 09/25/2014   stero. Benign   BREAST RECONSTRUCTION Left    CATARACT EXTRACTION     ESOPHAGOGASTRODUODENOSCOPY (EGD) WITH PROPOFOL  N/A 10/18/2020   Procedure: ESOPHAGOGASTRODUODENOSCOPY (EGD) WITH PROPOFOL ;  Surgeon: Goble Last  K, DO;  Location[facility];  Service: Endoscopy;  Laterality: N/A;  11:00am   MASTECTOMY Left    ROTATOR CUFF REPAIR     SPINAL FUSION      x 2   TOTAL ABDOMINAL HYSTERECTOMY W/ BILATERAL SALPINGOOPHORECTOMY     TUBAL LIGATION      Current Medications: Current Meds  Medication Sig   ACCU-CHEK GUIDE test strip CHECK BLOOD SUGARS DAILY   Accu-Chek Softclix Lancets lancets USE AS INSTRUCTED TO TEST SUGARS TWICE A DAY   amLODipine  (NORVASC ) 10 MG tablet TAKE 1 TABLET BY MOUTH EVERY DAY   Biotin 1000 MCG tablet Take 1,000 mcg by mouth daily.   Blood Glucose Monitoring Suppl (ACCU-CHEK GUIDE) w/Device KIT 1 each by Does not apply  route 2 (two) times daily. To test sugars. Dx. E11.9   DULoxetine  (CYMBALTA ) 30 MG capsule Take 3 capsules (90 mg total) by mouth daily.   FARXIGA  5 MG TABS tablet TAKE 1 TABLET (5 MG TOTAL) BY MOUTH DAILY.   fenofibrate (TRICOR) 48 MG tablet Take 48 mg by mouth daily.   FLAREX 0.1 % ophthalmic suspension Apply to eye.   fluorouracil  (EFUDEX ) 5 % cream Apply topically 2 (two) times daily. Apply 2x daily to right foot toes for 6 weeks.   furosemide  (LASIX ) 20 MG tablet TAKE 1 TABLET BY MOUTH EVERY DAY AS NEEDED   hydrALAZINE  (APRESOLINE ) 100 MG tablet Take 0.5 tablets (50 mg total) by mouth 2 (two) times daily.   labetalol  (NORMODYNE ) 200 MG tablet TAKE 1 TABLET BY MOUTH TWICE A DAY   levothyroxine  (SYNTHROID ) 25 MCG tablet TAKE 1 TABLET BY MOUTH EVERY DAY BEFORE BREAKFAST   Magnesium  500 MG TABS Take 500 mg by mouth daily.   rosuvastatin  (CRESTOR ) 10 MG tablet TAKE 1 TABLET BY MOUTH EVERY DAY   triamcinolone  ointment (KENALOG ) 0.1 % Apply 1 Application topically 2 (two) times daily. To affected areas (Back)   valsartan  (DIOVAN ) 320 MG tablet TAKE 1 TABLET BY MOUTH EVERY DAY   warfarin (COUMADIN ) 5 MG tablet TAKE 1/2 TABLET (2.5 MG) BY MOUTH DAILY EXCEPT TAKE 1 TABLET (5 MG) ON MONDAYS, WEDNESDAYS AND SATURDAYS OR AS DIRECTED BY ANTICOAGULATION CLINIC     Allergies:   Lipitor [atorvastatin], Morphine , Meperidine, and Naproxen sodium   Social History   Socioeconomic History   Marital status: Married    Spouse name: Not on file   Number of children: 1   Years of education: Not on file   Highest education level: Not on file  Occupational History   Occupation: Software engineer: RETIRED   Occupation: Producer, television/film/video   Occupation: school bus driver  Tobacco Use   Smoking status: Never    Passive exposure: Never   Smokeless tobacco: Never  Vaping Use   Vaping status: Never Used  Substance and Sexual Activity   Alcohol  use: No    Alcohol /week: 0.0 standard drinks of alcohol     Drug use: No   Sexual activity: Not Currently  Other Topics Concern   Not on file  Social History Narrative   Not on file   Social Drivers of Health   Financial Resource Strain: Low Risk  (07/02/2023)   Overall Financial Resource Strain (CARDIA)    Difficulty of Paying Living Expenses: Not hard at all  Food Insecurity: No Food Insecurity (07/02/2023)   Hunger Vital Sign    Worried About Running Out of Food in the Last Year: Never true    Ran  Out of Food in the Last Year: Never true  Transportation Needs: No Transportation Needs (07/02/2023)   PRAPARE - Administrator, Civil Service (Medical): No    Lack of Transportation (Non-Medical): No  Physical Activity: Inactive (07/02/2023)   Exercise Vital Sign    Days of Exercise per Week: 0 days    Minutes of Exercise per Session: 0 min  Stress: No Stress Concern Present (07/02/2023)   [employer] of Occupational Health - Occupational Stress Questionnaire    Feeling of Stress : Not at all  Social Connections: Moderately Isolated (07/02/2023)   Social Connection and Isolation Panel [NHANES]    Frequency of Communication with Friends and Family: More than three times a week    Frequency of Social Gatherings with Friends and Family: Twice a week    Attends Religious Services: Never    Database administrator or Organizations: No    Attends Engineer, structural: Never    Marital Status: Married     Family History: The patient's family history includes Arthritis in an other family member; Breast cancer in her cousin; Breast cancer (age of onset: 76) in her cousin; Depression in an other family member; Hyperlipidemia in an other family member; Hypertension in her maternal grandfather and another family member; Miscarriages / [place] in an other family member; Prostate cancer in her maternal uncle; Stroke in an other family member.  ROS:   Please see the history of present illness.     All other systems reviewed  and are negative.  EKGs/Labs/Other Studies Reviewed:         Recent Labs: 07/23/2023: ALT 14; BUN 27; Creatinine, Ser 1.19; Hemoglobin 11.6; Platelets 200; Potassium 3.8; Sodium 140; TSH 2.160   Recent Lipid Panel    Component Value Date/Time   CHOL 146 07/23/2023 1437   TRIG 170 (H) 07/23/2023 1437   TRIG 256 04/19/2009 0000   HDL 38 (L) 07/23/2023 1437   CHOLHDL 3.8 07/23/2023 1437   CHOLHDL 3 02/16/2022 1313   VLDL 21.0 02/16/2022 1313   LDLCALC 79 07/23/2023 1437   LDLDIRECT 83.0 08/01/2021 1427    Physical Exam:   VS:  BP 124/64 (BP Location: Right Arm, Patient Position: Sitting, Cuff Size: Normal)   Pulse 64   Ht 5\' 2"  (1.575 m)   Wt 140 lb 4.8 oz (63.6 kg)   SpO2 98%   BMI 25.66 kg/m  , BMI Body mass index is 25.66 kg/m. GENERAL:  Well appearing HEENT: Pupils equal round and reactive, fundi not visualized, oral mucosa unremarkable NECK:  No jugular venous distention, waveform within normal limits, carotid upstroke brisk and symmetric, no bruits, no thyromegaly LYMPHATICS:  No cervical adenopathy LUNGS:  Clear to auscultation bilaterally HEART:  RRR.  PMI not displaced or sustained,S1 and S2 within normal limits, no S3, no S4, no clicks, no rubs, no murmurs ABD:  Flat, positive bowel sounds normal in frequency in pitch, no bruits, no rebound, no guarding, no midline pulsatile mass, no hepatomegaly, no splenomegaly EXT:  2 plus pulses throughout, no edema, no cyanosis no clubbing SKIN:  No rashes no nodules NEURO:  Cranial nerves II through XII grossly intact, motor grossly intact throughout PSYCH:  Cognitively intact, oriented to person place and time   ASSESSMENT/PLAN:    HTN - BP at goal <130/80. Suspect higher readings at home are as she is checking prior to medications. Home BP cuff previously found to be accurate today. Continue present antihypertensive regimen Amlodipine  10mg   daily, Lasix  20mg  daily, Hydralazine  50 BID, Labetolol 200mg  BID, Valsartan  320mg   daily. Instructed to check BP after medications, will follow up via phone call in 1-2 weeks. Will send in hydralazine  50mg  tablet so she does not have to split 100mg  tablet in half any longer.   OSA / Insomnia - Follows with pulmonology. She has a CPAP waiting for her to pick up but has not been able to pick it up yet related to helping care for her husband in the hospital, he is now back at his [place] care facility. PCP started Doxepin at bedtime PRN for insomnia  Screening for Secondary Hypertension:     Relevant Labs/Studies:    Latest Ref Rng & Units 07/23/2023    2:37 PM 06/24/2023   10:41 PM 04/03/2023    1:59 PM  Basic Labs  Sodium 134 - 144 mmol/L 140  139  139   Potassium 3.5 - 5.2 mmol/L 3.8  3.5  4.3   Creatinine 0.57 - 1.00 mg/dL 0.98  1.19  1.47        Latest Ref Rng & Units 07/23/2023    2:37 PM 03/06/2023    4:29 PM  Thyroid    TSH 0.450 - 4.500 uIU/mL 2.160  1.360        Latest Ref Rng & Units 12/04/2022    9:36 AM  Renin/Aldosterone   Aldosterone 0.0 - 30.0 ng/dL 3.9   Aldos/Renin Ratio 0.0 - 30.0 1.0        Latest Ref Rng & Units 12/04/2022    9:36 AM  Metanephrines/Catecholamines   Epinephrine 0 - 62 pg/mL <15   Norepinephrine 0 - 874 pg/mL 1,677   Dopamine 0 - 48 pg/mL 101   Metanephrines 0.0 - 88.0 pg/mL <25.0   Normetanephrines  0.0 - 285.2 pg/mL 321.2            Disposition:    FU with MD/PharmD in 6 months    Medication Adjustments/Labs and Tests Ordered: Current medicines are reviewed at length with the patient today.  Concerns regarding medicines are outlined above.  No orders of the defined types were placed in this encounter.  No orders of the defined types were placed in this encounter.    Signed, Clearnce Curia, NP  08/23/2023 2:56 PM    [facility]

## 2023-08-23 NOTE — Progress Notes (Signed)
 Subjective:   Andrea Simmons 07/04/1940 08/23/2023  Chief Complaint  Patient presents with   Medical Management of Chronic Issues    2-3 week follow up; denies any main concerns for today other than that she still cannot sleep well at night.    HPI: Andrea Simmons presents today for re-assessment and management of chronic medical conditions.  RASH:  Patient is here for follow-up of cellulitic rash seen on 07/31/2023 to left lower extremity.  She was started on Keflex  1 capsule 4 times daily for 5 days.  Patient states she took 3-1/2 days of her antibiotic and noticed improvement in the rash and swelling.  She denies any drainage, tenderness, or pain.    INSOMNIA: Andrea ALDERFER presents for the medical management of Insomnia. She states since her last visit she has episodes where she does not sleep for appr.x 50 hours. She has been placed on Remeron  and Lunesta  in the past by prior PCP without improvement. She states her chronic pain keeps her up at night and she has tried Gabapentin  in the past without relief and felt drowsy the next day.  Patient is currently on duloxetine  90 mg for chronic pain. Duration: chronic Average hours of sleep: 4-5 Difficulty falling asleep: yes Difficulty staying asleep: yes Good sleep hygiene: yes Treatments tried: Lunesta , Remeron     Recent stress: no Daytime sleepiness: no Apnea: no Snoring: no     The following portions of the patient's history were reviewed and updated as appropriate: past medical history, past surgical history, family history, social history, allergies, medications, and problem list.   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   Grade I diastolic dysfunction 04/04/2023   Acute venous embolism and thrombosis of deep vessels of proximal lower extremity (HCC) 12/04/2022   Snoring 11/13/2022    Depression, recurrent (HCC) 09/28/2022   Tear film insufficiency 04/19/2021   Sjogren's disease (HCC) 01/24/2021   Hereditary and idiopathic neuropathy, unspecified 12/10/2020   Osteoarthritis 12/10/2020   Esophageal dysphagia 09/16/2020   Vitamin D  deficiency 10/01/2019   Polypharmacy 10/01/2019   Major depressive disorder, recurrent episode, moderate (HCC) 10/01/2019   Hx of pulmonary embolus 03/30/2017   Hyperlipidemia 12/26/2013   Encounter for therapeutic drug monitoring 04/16/2013   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   Diabetes type 2, controlled (HCC) 08/27/2008   ANEMIA-NOS 08/27/2008   Seasonal and perennial allergic rhinitis 08/27/2008   Lung nodule 08/27/2008   Fibromyalgia 08/27/2008   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 (HCC) 03/20/2007   Hypothyroidism 03/20/2007   Essential hypertension 03/20/2007   Recurrent pulmonary emboli (HCC) 03/20/2007   GERD 03/20/2007   ESOPHAGEAL STRICTURE 03/08/2007   Past Medical History:  Diagnosis Date   Abnormal gait 12/10/2020   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) 05/15/2022   Anxiety    Breast cancer (HCC)    Chest pain of uncertain etiology 12/25/2018   Chronic osteoarthritis    CKD stage 3b, GFR 30-44 ml/min (HCC) 01/25/2021   Dehydration 09/16/2020   Depression    Diabetes mellitus type II    Diabetes type 2, controlled (HCC) 08/27/2008   Qualifier: Diagnosis of   By: Autry Legions MD, Alveda Aures        Elevated lipase 09/16/2020   Essential hypertension 03/20/2007   Qualifier: Diagnosis of  ByRochelle Chu RN, CGRN, Sheri         Exertional dyspnea 11/09/2008   11/20/2017  Walked RA x 3 laps @ 185 ft each stopped due to  End of study, fast pace, no desat, min sob   - Echo 12/05/2017 - LVEF 60-65%, moderate LVH, normal wall motion, grade 1 DD,   indeterminate LV filling pressure, normal GLS at -21%, normal LA   size, trivial TR, RVSP 20 mmHg, normal IVC.           Fibromyalgia     GERD (gastroesophageal reflux disease)    Hyperlipidemia    Hypertension    Hypothyroidism    Leg edema 06/04/2019   Malignant neoplasm of breast (female), unspecified site 1993   L breast s/p mastectomy and tamoxifen x 60yrs   Osteoporosis    Other pulmonary embolism and infarction 2008 and 2009   chronic anticoag - [facility]   Past Surgical History:  Procedure Laterality Date   APPENDECTOMY     BALLOON DILATION N/A 10/18/2020   Procedure: BALLOON DILATION;  Surgeon: Vinetta Greening, DO;  [facility];  Service: Endoscopy;  Laterality: N/A;   BIOPSY  10/18/2020   Procedure: BIOPSY;  Surgeon: Vinetta Greening, DO;  [facility];  Service: Endoscopy;;  gastric    BREAST BIOPSY Left 1993   BREAST BIOPSY Right 09/25/2014   stero. Benign   BREAST RECONSTRUCTION Left    CATARACT EXTRACTION     ESOPHAGOGASTRODUODENOSCOPY (EGD) WITH PROPOFOL  N/A 10/18/2020   Procedure: ESOPHAGOGASTRODUODENOSCOPY (EGD) WITH PROPOFOL ;  Surgeon: Vinetta Greening, DO;  [facility];  Service: Endoscopy;  Laterality: N/A;  11:00am   MASTECTOMY Left    ROTATOR CUFF REPAIR     SPINAL FUSION      x 2   TOTAL ABDOMINAL HYSTERECTOMY W/ BILATERAL SALPINGOOPHORECTOMY     TUBAL LIGATION     Family History  Problem Relation Age of Onset   Prostate cancer Maternal Uncle    Hypertension Maternal Grandfather    Breast cancer Cousin    Breast cancer Cousin 78   Depression Other        Parent   Arthritis Other        Parent, Grandparent   Hypertension Other        Grandparent   Hyperlipidemia Other        FMH   Miscarriages / Stillbirths Other        Grandparent   Stroke Other        [facility]   Outpatient Medications Prior to Visit  Medication Sig Dispense Refill   ACCU-CHEK GUIDE test strip CHECK BLOOD SUGARS DAILY 100 strip 12   Accu-Chek Softclix Lancets lancets USE AS INSTRUCTED TO TEST SUGARS TWICE A DAY 100 each 12   amLODipine  (NORVASC ) 10 MG tablet TAKE 1 TABLET BY MOUTH  EVERY DAY 90 tablet 2   Biotin 1000 MCG tablet Take 1,000 mcg by mouth daily.     Blood Glucose Monitoring Suppl (ACCU-CHEK GUIDE) w/Device KIT 1 each by Does not apply route 2 (two) times daily. To test sugars. Dx. E11.9 1 kit 1   DULoxetine  (CYMBALTA ) 30 MG capsule Take 3 capsules (90 mg total) by mouth daily. 270 capsule 3   FARXIGA  5 MG TABS tablet TAKE 1 TABLET (5 MG TOTAL) BY MOUTH DAILY. 30 tablet 2   fenofibrate (TRICOR) 48 MG tablet Take 48 mg by mouth daily.     FLAREX 0.1 % ophthalmic  suspension Apply to eye.     fluorouracil  (EFUDEX ) 5 % cream Apply topically 2 (two) times daily. Apply 2x daily to right foot toes for 6 weeks. 40 g 2   furosemide  (LASIX ) 20 MG tablet TAKE 1 TABLET BY MOUTH EVERY DAY AS NEEDED 90 tablet 2   labetalol  (NORMODYNE ) 200 MG tablet TAKE 1 TABLET BY MOUTH TWICE A DAY 180 tablet 2   levothyroxine  (SYNTHROID ) 25 MCG tablet TAKE 1 TABLET BY MOUTH EVERY DAY BEFORE BREAKFAST 90 tablet 3   Magnesium  500 MG TABS Take 500 mg by mouth daily.     rosuvastatin  (CRESTOR ) 10 MG tablet TAKE 1 TABLET BY MOUTH EVERY DAY 90 tablet 1   triamcinolone  ointment (KENALOG ) 0.1 % Apply 1 Application topically 2 (two) times daily. To affected areas (Back) 60 g 3   valsartan  (DIOVAN ) 320 MG tablet TAKE 1 TABLET BY MOUTH EVERY DAY 90 tablet 2   warfarin (COUMADIN ) 5 MG tablet TAKE 1/2 TABLET (2.5 MG) BY MOUTH DAILY EXCEPT TAKE 1 TABLET (5 MG) ON MONDAYS, WEDNESDAYS AND SATURDAYS OR AS DIRECTED BY ANTICOAGULATION CLINIC 75 tablet 2   eszopiclone  (LUNESTA ) 2 MG TABS tablet TAKE 1 TABLET (2 MG TOTAL) BY MOUTH IMMEDIATELY BEFORE BEDTIME AS NEEDED FOR SLEEP 30 tablet 3   hydrALAZINE  (APRESOLINE ) 100 MG tablet Take 0.5 tablets (50 mg total) by mouth 2 (two) times daily. 90 tablet 0   No facility-administered medications prior to visit.   Allergies  Allergen Reactions   Lipitor [Atorvastatin]     Unknown   Morphine  Nausea And Vomiting   Meperidine Nausea And Vomiting   Naproxen Sodium  Nausea And Vomiting     ROS: A complete ROS was performed with pertinent positives/negatives noted in the HPI. The remainder of the ROS are negative.    Objective:   Today's Vitals   08/23/23 1323  BP: 125/67  Pulse: (!) 55  SpO2: 100%  Weight: 142 lb (64.4 kg)  Height: 5\' 2"  (1.575 m)    Physical Exam          GENERAL: Well-appearing, in NAD. Well nourished.  SKIN: Pink, warm and dry.  Erythema to left lower extremity has resolved.  No streaking, drainage, or redness. Head: Normocephalic. NECK: Trachea midline. Full ROM w/o pain or tenderness.  RESPIRATORY: Chest wall symmetrical. Respirations even and non-labored. Breath sounds clear to auscultation bilaterally.  CARDIAC: S1, S2 present, regular rate and rhythm without murmur or gallops. Peripheral pulses 2+ bilaterally.  MSK: Muscle tone and strength appropriate for age. NEUROLOGIC: No motor or sensory deficits. Steady, even gait. C2-C12 intact.  PSYCH/MENTAL STATUS: Alert, oriented x 3. Cooperative, appropriate mood and affect.   Health Maintenance Due  Topic Date Due   OPHTHALMOLOGY EXAM  02/15/2022   FOOT EXAM  02/17/2023    No results found for any visits on 08/23/23.  The ASCVD Risk score (Arnett DK, et al., 2019) failed to calculate for the following reasons:   The 2019 ASCVD risk score is only valid for ages 6 to 19     Assessment & Plan:  1. Primary insomnia (Primary) Chronically uncontrolled.  Patient will stop Lunesta  and trial doxepin 3 mg at bedtime as needed for insomnia.  Will trial this for at least 1 to 2 weeks and reach out to PCP if not improving or uncontrolled. - Doxepin HCl 3 MG TABS; Take 1 tablet (3 mg total) by mouth at bedtime as needed (insomnia). (Patient not taking: Reported on 08/23/2023)  Dispense: 30  tablet; Refill: 3  2. Skin infection Resolved.  No signs of recurrent infection at this time.    Meds ordered this encounter  Medications   Doxepin HCl 3 MG TABS    Sig: Take 1  tablet (3 mg total) by mouth at bedtime as needed (insomnia).    Dispense:  30 tablet    Refill:  3    Supervising Provider:   DE Peru, RAYMOND J [[id number]]   Lab Orders  No laboratory test(s) ordered today   Return in about 4 months (around 12/23/2023) for Follow up Chronic Conditions .    Patient to reach out to office if new, worrisome, or unresolved symptoms arise or if no improvement in patient's condition. Patient verbalized understanding and is agreeable to treatment plan. All questions answered to patient's satisfaction.    Nonda Bays, [place]

## 2023-08-23 NOTE — Patient Instructions (Addendum)
 Medication Instructions:  Continue your current medications.   We will send in the 50mg  tablet of Hydralazine  so you do not have to split the 100mg  tablet in half.    Follow-Up: Please follow up in 6 months in [facility] with Dr. Theodis Fiscal, Neomi Banks, NP or Donivan Furry PharmD   Special Instructions:  Recommend checking your blood pressure about an hour after your medications as this would be the most accurate measure of your BP at home. Your BP in clinic visits have been fantastic!  To prevent or reduce lower extremity swelling: Eat a low salt diet. Salt makes the body hold onto extra fluid which causes swelling. Sit with legs elevated. For example, in the recliner or on an ottoman.  Wear knee-high compression stockings during the daytime. Ones labeled 15-20 mmHg provide good compression. Can try zip up compression stockings if that is easier.

## 2023-08-24 ENCOUNTER — Telehealth (HOSPITAL_BASED_OUTPATIENT_CLINIC_OR_DEPARTMENT_OTHER): Payer: Self-pay | Admitting: *Deleted

## 2023-08-24 ENCOUNTER — Ambulatory Visit (HOSPITAL_BASED_OUTPATIENT_CLINIC_OR_DEPARTMENT_OTHER): Payer: Self-pay | Admitting: Family Medicine

## 2023-08-24 ENCOUNTER — Other Ambulatory Visit (HOSPITAL_BASED_OUTPATIENT_CLINIC_OR_DEPARTMENT_OTHER): Payer: Self-pay | Admitting: Family Medicine

## 2023-08-24 DIAGNOSIS — Z7901 Long term (current) use of anticoagulants: Secondary | ICD-10-CM

## 2023-08-24 LAB — PROTIME-INR
INR: 4.1 — ABNORMAL HIGH (ref 0.9–1.2)
Prothrombin Time: 40.8 s — ABNORMAL HIGH (ref 9.1–12.0)

## 2023-08-24 MED ORDER — WARFARIN SODIUM 5 MG PO TABS: ORAL_TABLET | ORAL | Status: DC

## 2023-08-24 NOTE — Telephone Encounter (Signed)
 Copied from CRM [id number]. Topic: General - Other >> Aug 24, 2023  9:32 AM Emylou G wrote: Reason for CRM: adv patient of dr Jerel Monarch

## 2023-08-24 NOTE — Telephone Encounter (Signed)
 noted

## 2023-08-24 NOTE — Progress Notes (Signed)
 Please call Andrea Simmons and let her know her INR is elevated likely due to recent antibiotic use.  I have recommended she change her warfarin schedule to the following: Please have her hold her warfarin today and start with the following schedule taking 1 tablet Monday through Thursday AND 1/2 (half a tablet) on Friday, Saturday, and Sunday.  This is also been updated in her medication list.  She needs to return next Wednesday or Thursday for INR recheck.

## 2023-08-28 DIAGNOSIS — G4733 Obstructive sleep apnea (adult) (pediatric): Secondary | ICD-10-CM | POA: Diagnosis not present

## 2023-09-04 ENCOUNTER — Other Ambulatory Visit (HOSPITAL_BASED_OUTPATIENT_CLINIC_OR_DEPARTMENT_OTHER): Payer: Self-pay | Admitting: Family Medicine

## 2023-09-04 ENCOUNTER — Other Ambulatory Visit (HOSPITAL_COMMUNITY): Payer: Self-pay | Admitting: Family Medicine

## 2023-09-04 ENCOUNTER — Telehealth (HOSPITAL_BASED_OUTPATIENT_CLINIC_OR_DEPARTMENT_OTHER): Payer: Self-pay | Admitting: Family Medicine

## 2023-09-04 DIAGNOSIS — Z7901 Long term (current) use of anticoagulants: Secondary | ICD-10-CM | POA: Diagnosis not present

## 2023-09-04 DIAGNOSIS — N184 Chronic kidney disease, stage 4 (severe): Secondary | ICD-10-CM

## 2023-09-04 NOTE — Telephone Encounter (Signed)
 Patient came by the office to ask to speak to [facility] about her Doxepin  and that it is not helping her sleep at all.  She stated she would like to up the dosage.  Please advise

## 2023-09-05 ENCOUNTER — Ambulatory Visit (HOSPITAL_BASED_OUTPATIENT_CLINIC_OR_DEPARTMENT_OTHER): Payer: Self-pay | Admitting: Family Medicine

## 2023-09-05 DIAGNOSIS — Z7901 Long term (current) use of anticoagulants: Secondary | ICD-10-CM

## 2023-09-05 LAB — PROTIME-INR
INR: 2.1 — ABNORMAL HIGH (ref 0.9–1.2)
Prothrombin Time: 22.4 s — ABNORMAL HIGH (ref 9.1–12.0)

## 2023-09-06 NOTE — Telephone Encounter (Signed)
 Patient advised with verbal understanding scheduled an appt with alexis

## 2023-09-17 ENCOUNTER — Other Ambulatory Visit (HOSPITAL_BASED_OUTPATIENT_CLINIC_OR_DEPARTMENT_OTHER): Payer: Self-pay | Admitting: Family Medicine

## 2023-09-17 DIAGNOSIS — F5101 Primary insomnia: Secondary | ICD-10-CM

## 2023-09-17 MED ORDER — DOXEPIN HCL 3 MG PO TABS: 3.0000 mg | ORAL_TABLET | Freq: Every evening | ORAL | 3 refills | Status: DC | PRN

## 2023-09-17 NOTE — Telephone Encounter (Signed)
 Copied from CRM [id number]. Topic: Clinical - Medication Refill >> Sep 17, 2023 10:13 AM Sophia H wrote: Medication:  Doxepin  HCl 3 MG TABS **Patient states she was told to take 6mg  nightly instead of 3 and is now out of medication. I do see instructions in chart from Dr. De Peru on 06/19 but new rx is needed to get her to f/u appointment on 07/08. Ty  Has the patient contacted their pharmacy? Yes, advised contact office   This is the patient's preferred pharmacy:  [id number] - [place], [place] - [address] [place] [address] Phone: [phone] Fax: [phone]  Is this the correct pharmacy for this prescription? Yes If no, delete pharmacy and type the correct one.   Has the prescription been filled recently? Yes  Is the patient out of the medication? Yes  Has the patient been seen for an appointment in the last year OR does the patient have an upcoming appointment? Yes, appointment schedule 07/08  Can we respond through MyChart? No, prefers phone call.   Agent: Please be advised that Rx refills may take up to 3 business days. We ask that you follow-up with your pharmacy.

## 2023-09-18 ENCOUNTER — Other Ambulatory Visit (HOSPITAL_BASED_OUTPATIENT_CLINIC_OR_DEPARTMENT_OTHER): Payer: Self-pay | Admitting: *Deleted

## 2023-09-18 DIAGNOSIS — Z7901 Long term (current) use of anticoagulants: Secondary | ICD-10-CM

## 2023-09-19 ENCOUNTER — Ambulatory Visit (HOSPITAL_BASED_OUTPATIENT_CLINIC_OR_DEPARTMENT_OTHER): Payer: Self-pay | Admitting: Family Medicine

## 2023-09-19 DIAGNOSIS — Z7901 Long term (current) use of anticoagulants: Secondary | ICD-10-CM

## 2023-09-19 LAB — PROTIME-INR
INR: 1.4 — ABNORMAL HIGH (ref 0.9–1.2)
Prothrombin Time: 15.4 s — ABNORMAL HIGH (ref 9.1–12.0)

## 2023-09-19 NOTE — Progress Notes (Signed)
 Please call Andrea Simmons and let know INR was slightly lower. Please have her take her Warfarin as follows: Take 1 tablet Monday-Friday and 1/2 tablet on Saturday and Sunday. Recheck in 1 week please. Thanks!

## 2023-09-25 ENCOUNTER — Ambulatory Visit (HOSPITAL_BASED_OUTPATIENT_CLINIC_OR_DEPARTMENT_OTHER): Admitting: Family Medicine

## 2023-09-28 ENCOUNTER — Other Ambulatory Visit (HOSPITAL_BASED_OUTPATIENT_CLINIC_OR_DEPARTMENT_OTHER): Payer: Self-pay | Admitting: *Deleted

## 2023-09-28 DIAGNOSIS — Z7901 Long term (current) use of anticoagulants: Secondary | ICD-10-CM

## 2023-09-29 LAB — PROTIME-INR
INR: 2.1 — ABNORMAL HIGH (ref 0.9–1.2)
Prothrombin Time: 21.5 s — ABNORMAL HIGH (ref 9.1–12.0)

## 2023-09-30 ENCOUNTER — Ambulatory Visit (HOSPITAL_BASED_OUTPATIENT_CLINIC_OR_DEPARTMENT_OTHER): Payer: Self-pay | Admitting: Family Medicine

## 2023-09-30 DIAGNOSIS — Z7901 Long term (current) use of anticoagulants: Secondary | ICD-10-CM

## 2023-09-30 NOTE — Progress Notes (Signed)
 Schedule PT INR lab in 1 week.  Hi Andrea Simmons,  Your INR is back in range. Please continue the following schedule of your Warfarin:  Take 1 tablet Monday-Friday and 1/2 tablet on Saturday and Sunday. Recheck in 1 week please.

## 2023-10-03 ENCOUNTER — Telehealth (HOSPITAL_BASED_OUTPATIENT_CLINIC_OR_DEPARTMENT_OTHER): Payer: Self-pay | Admitting: *Deleted

## 2023-10-03 ENCOUNTER — Other Ambulatory Visit (HOSPITAL_BASED_OUTPATIENT_CLINIC_OR_DEPARTMENT_OTHER): Payer: Self-pay | Admitting: Family Medicine

## 2023-10-03 DIAGNOSIS — F5101 Primary insomnia: Secondary | ICD-10-CM

## 2023-10-03 MED ORDER — DOXEPIN HCL 6 MG PO TABS: 1.0000 | ORAL_TABLET | Freq: Every evening | ORAL | 5 refills | Status: DC | PRN

## 2023-10-03 NOTE — Telephone Encounter (Signed)
 Copied from CRM [id number]. Topic: Clinical - Prescription Issue >> Oct 03, 2023 11:31 AM [name] wrote: Reason for CRM: Patient requetisn medication refill. Patient would like the dosage changed to 6mg  works much better than the 3mg    Patient is out of medication   Doxepin  HCl 3 MG TABS  Medication has been Discontinued   Per note on 09/06/23 de Peru, Andrea PARAS, MD to Andrea Simmons, CMA   09/06/23  1:27 PM Would be okay to increase dose to 6 MG nightly. Recommend scheduling follow-up appointment in about 2 weeks with PCP to assess response.   Which provider does the appt need to be scheduled with Dr everitt Peru or Thersia Stark FNP   Patient would like to speak with a nurse  phone [phone] ok to leave detailed message

## 2023-10-03 NOTE — Telephone Encounter (Signed)
Routing to Amanda for review.

## 2023-10-03 NOTE — Telephone Encounter (Signed)
 Tried to call pt to see if she wanted to be seen either Tuesday 7/22 at either 10:50 or 3:50 or on 7/23 at either 10:50 or 3:50 for a follow up and to also have INR checked same day.  Unable to reach. Left message to return call.

## 2023-10-05 NOTE — Telephone Encounter (Signed)
 Pt scheduled for an appt 7/23.

## 2023-10-10 ENCOUNTER — Ambulatory Visit (HOSPITAL_BASED_OUTPATIENT_CLINIC_OR_DEPARTMENT_OTHER): Admitting: Family Medicine

## 2023-10-10 ENCOUNTER — Other Ambulatory Visit (HOSPITAL_BASED_OUTPATIENT_CLINIC_OR_DEPARTMENT_OTHER): Payer: Self-pay | Admitting: *Deleted

## 2023-10-10 ENCOUNTER — Encounter (HOSPITAL_BASED_OUTPATIENT_CLINIC_OR_DEPARTMENT_OTHER): Payer: Self-pay | Admitting: Family Medicine

## 2023-10-10 VITALS — BP 168/80 | HR 62 | Ht 62.0 in | Wt 137.1 lb

## 2023-10-10 DIAGNOSIS — Z7901 Long term (current) use of anticoagulants: Secondary | ICD-10-CM | POA: Diagnosis not present

## 2023-10-10 DIAGNOSIS — G4733 Obstructive sleep apnea (adult) (pediatric): Secondary | ICD-10-CM | POA: Diagnosis not present

## 2023-10-10 DIAGNOSIS — F5101 Primary insomnia: Secondary | ICD-10-CM | POA: Diagnosis not present

## 2023-10-10 MED ORDER — MIRTAZAPINE 7.5 MG PO TABS: 7.5000 mg | ORAL_TABLET | Freq: Every day | ORAL | 3 refills | Status: DC

## 2023-10-10 NOTE — Progress Notes (Signed)
 Subjective:   Andrea Simmons [birth year] 10/10/2023  Chief Complaint  Patient presents with   Medical Management of Chronic Issues    Patient is here to discuss possibly changing doxepin  doses. States while PCP was out of the office, she said the dose was changed by covering provider. Since being on the 6mg , she stated at first she was sleeping better but then recently she states that she has been having problems sleeping.    HPI: Andrea Simmons presents today for re-assessment and management of chronic medical conditions.  INSOMNIA: Andrea Simmons presents for the medical management of Insomnia.  Duration: years Average hours of sleep: 4-5  Difficulty falling asleep: yes Difficulty staying asleep: yes Good sleep hygiene: yes Treatments tried: pt reports trying prescription medications in the past including Lunesta , Trazadone, Elavil, gabapentin . She states a few of these made her too drowsy and too out of it the next day.   She has been taking doxepin  3mg , called this office 3-4 weeks ago d/t sleep disturbances and was told she could increase to 6mg  nightly. She states the 6mg  working for 3-4 nights before she started having trouble sleeping again.   Recent stress: no Daytime sleepiness: yes Apnea: yes Snoring: yes History of sleep study: yes  Pt was dx'ed with mild OSA during May 2025. She reports wearing a small C-pap mask but states she couldn't tolerate it, so she returned it.   The following portions of the patient's history were reviewed and updated as appropriate: past medical history, past surgical history, family history, social history, allergies, medications, and problem list.   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   Grade I diastolic dysfunction 04/04/2023   Acute venous embolism and thrombosis of deep vessels of proximal  lower extremity (HCC) 12/04/2022   Snoring 11/13/2022   Depression, recurrent (HCC) 09/28/2022   Tear film insufficiency 04/19/2021   Sjogren's disease (HCC) 01/24/2021   Hereditary and idiopathic neuropathy, unspecified 12/10/2020   Osteoarthritis 12/10/2020   Esophageal dysphagia 09/16/2020   Vitamin D  deficiency 10/01/2019   Polypharmacy 10/01/2019   Major depressive disorder, recurrent episode, moderate (HCC) 10/01/2019   Hx of pulmonary embolus 03/30/2017   Hyperlipidemia 12/26/2013   Encounter for therapeutic drug monitoring 04/16/2013   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   Diabetes type 2, controlled (HCC) 08/27/2008   ANEMIA-NOS 08/27/2008   Seasonal and perennial allergic rhinitis 08/27/2008   Lung nodule 08/27/2008   Fibromyalgia 08/27/2008   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 (HCC) 03/20/2007   Hypothyroidism 03/20/2007   Essential hypertension 03/20/2007   Recurrent pulmonary emboli (HCC) 03/20/2007   GERD 03/20/2007   ESOPHAGEAL STRICTURE 03/08/2007   Past Medical History:  Diagnosis Date   Abnormal gait 12/10/2020   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) 05/15/2022   Anxiety    Breast cancer (HCC)    Chest pain of uncertain etiology 12/25/2018   Chronic osteoarthritis    CKD stage 3b, GFR 30-44 ml/min (HCC) 01/25/2021   Dehydration 09/16/2020   Depression    Diabetes mellitus type II    Diabetes type 2, controlled (HCC) 08/27/2008   Qualifier: Diagnosis of   By: Norleen MD, Lynwood ORN        Elevated lipase 09/16/2020   Essential hypertension 03/20/2007   Qualifier: Diagnosis of  ByBETHA Molt RN, CGRN, Sheri         Exertional dyspnea 11/09/2008   11/20/2017  Walked RA x 3 laps @ 185 ft each stopped due to  End of study, fast pace, no desat, min sob   - Echo 12/05/2017 - LVEF 60-65%, moderate LVH, normal wall motion, grade 1 DD,   indeterminate LV filling pressure, normal GLS at -21%, normal LA   size,  trivial TR, RVSP 20 mmHg, normal IVC.           Fibromyalgia    GERD (gastroesophageal reflux disease)    Hyperlipidemia    Hypertension    Hypothyroidism    Leg edema 06/04/2019   Malignant neoplasm of breast (female), unspecified site 1993   L breast s/p mastectomy and tamoxifen x 49yrs   Osteoporosis    Other pulmonary embolism and infarction 2008 and 2009   chronic anticoag - [facility]   Past Surgical History:  Procedure Laterality Date   APPENDECTOMY     BALLOON DILATION N/A 10/18/2020   Procedure: BALLOON DILATION;  Surgeon: Cindie Carlin POUR, DO;  [facility];  Service: Endoscopy;  Laterality: N/A;   BIOPSY  10/18/2020   Procedure: BIOPSY;  Surgeon: Cindie Carlin POUR, DO;  [facility];  Service: Endoscopy;;  gastric    BREAST BIOPSY Left 1993   BREAST BIOPSY Right 09/25/2014   stero. Benign   BREAST RECONSTRUCTION Left    CATARACT EXTRACTION     ESOPHAGOGASTRODUODENOSCOPY (EGD) WITH PROPOFOL  N/A 10/18/2020   Procedure: ESOPHAGOGASTRODUODENOSCOPY (EGD) WITH PROPOFOL ;  Surgeon: Cindie Carlin POUR, DO;  [facility];  Service: Endoscopy;  Laterality: N/A;  11:00am   MASTECTOMY Left    ROTATOR CUFF REPAIR     SPINAL FUSION      x 2   TOTAL ABDOMINAL HYSTERECTOMY W/ BILATERAL SALPINGOOPHORECTOMY     TUBAL LIGATION     Family History  Problem Relation Age of Onset   Prostate cancer Maternal Uncle    Hypertension Maternal Grandfather    Breast cancer Cousin    Breast cancer Cousin 58   Depression Other        Parent   Arthritis Other        Parent, Grandparent   Hypertension Other        Grandparent   Hyperlipidemia Other        FMH   Miscarriages / Stillbirths Other        Grandparent   Stroke Other        [facility]   Outpatient Medications Prior to Visit  Medication Sig Dispense Refill   ACCU-CHEK GUIDE test strip CHECK BLOOD SUGARS DAILY 100 strip 12   Accu-Chek Softclix Lancets lancets USE AS INSTRUCTED TO TEST SUGARS TWICE A DAY 100 each  12   amLODipine  (NORVASC ) 10 MG tablet TAKE 1 TABLET BY MOUTH EVERY DAY 90 tablet 2   Biotin 1000 MCG tablet Take 1,000 mcg by mouth daily.     Blood Glucose Monitoring Suppl (ACCU-CHEK GUIDE) w/Device KIT 1 each by Does not apply route 2 (two) times daily. To test sugars. Dx. E11.9 1 kit 1   DULoxetine  (CYMBALTA ) 30 MG capsule Take 3 capsules (90 mg total) by mouth daily. 270 capsule 3   FARXIGA  5 MG TABS tablet TAKE 1 TABLET (5 MG TOTAL) BY MOUTH DAILY. 30 tablet 2   fenofibrate (TRICOR) 48 MG tablet Take 48 mg by mouth daily.     FLAREX 0.1 % ophthalmic  suspension Apply to eye.     furosemide  (LASIX ) 20 MG tablet TAKE 1 TABLET BY MOUTH EVERY DAY AS NEEDED 90 tablet 2   hydrALAZINE  (APRESOLINE ) 50 MG tablet Take 1 tablet (50 mg total) by mouth 2 (two) times daily. 180 tablet 1   labetalol  (NORMODYNE ) 200 MG tablet TAKE 1 TABLET BY MOUTH TWICE A DAY 180 tablet 2   levothyroxine  (SYNTHROID ) 25 MCG tablet TAKE 1 TABLET BY MOUTH EVERY DAY BEFORE BREAKFAST 90 tablet 3   Magnesium  500 MG TABS Take 500 mg by mouth daily.     rosuvastatin  (CRESTOR ) 10 MG tablet TAKE 1 TABLET BY MOUTH EVERY DAY 90 tablet 1   triamcinolone  ointment (KENALOG ) 0.1 % Apply 1 Application topically 2 (two) times daily. To affected areas (Back) 60 g 3   valsartan  (DIOVAN ) 320 MG tablet TAKE 1 TABLET BY MOUTH EVERY DAY 90 tablet 2   warfarin (COUMADIN ) 5 MG tablet TAKE 1 (5 MG) TABLET DAILY BY MOUTH EXCEPT TAKE 1/2 TABLET (2.5 MG) ON FRIDAY, SATURDAY, AND SUNDAYS OR AS DIRECTED BY ANTICOAGULATION CLINIC.     Doxepin  HCl 6 MG TABS Take 1 tablet (6 mg total) by mouth at bedtime as needed (insomnia). 30 tablet 5   No facility-administered medications prior to visit.   Allergies  Allergen Reactions   Lipitor [Atorvastatin]     Unknown   Morphine  Nausea And Vomiting   Meperidine Nausea And Vomiting   Naproxen Sodium Nausea And Vomiting    ROS: A complete ROS was performed with pertinent positives/negatives noted in the  HPI. The remainder of the ROS are negative.    Objective:   Today's Vitals   10/10/23 1043  BP: (!) 168/80  Pulse: 62  SpO2: 98%  Weight: 62.2 kg  Height: 5' 2 (1.575 m)   GENERAL: Well-appearing, in NAD. Well nourished.  SKIN: Pink, warm and dry. No rash, lesion, ulceration, or ecchymoses.  Head: Normocephalic. NECK: Trachea midline. Full ROM w/o pain or tenderness.  RESPIRATORY: Chest wall symmetrical. Respirations even and non-labored.  CARDIAC: Peripheral pulses 2+ bilaterally.  MSK: Muscle tone and strength appropriate for age. Joints w/o tenderness, redness, or swelling.  EXTREMITIES: Without clubbing, cyanosis, or edema.  NEUROLOGIC: No motor or sensory deficits. Steady, even gait. C2-C12 intact.  PSYCH/MENTAL STATUS: Alert, oriented x 3. Cooperative, appropriate mood and affect.   Health Maintenance Due  Topic Date Due   OPHTHALMOLOGY EXAM  02/15/2022   FOOT EXAM  02/17/2023   HEMOGLOBIN A1C  09/04/2023    No results found for any visits on 10/10/23.  The ASCVD Risk score (Arnett DK, et al., 2019) failed to calculate for the following reasons:   The 2019 ASCVD risk score is only valid for ages 63 to 23     Assessment & Plan:  1. Primary insomnia (Primary) Pt advised to stop taking doxepin , and start mirtazapine  7.5mg  at bedtime. Encouraged pt to continue good sleep hygiene, including taking medication 20-30 minutes before laying down, decrease stimulation in the room including lights, TV, and sound, avoid drinking fluids before going to bed.   2. Mild obstructive sleep apnea Pt encouraged to reach out to her pulmonologist regarding different options for C-pap masks.    Meds ordered this encounter  Medications   mirtazapine  (REMERON ) 7.5 MG tablet    Sig: Take 1 tablet (7.5 mg total) by mouth at bedtime.    Dispense:  30 tablet    Refill:  3    Supervising Provider:   DE  PERU, RAYMOND J [[id number]]   Lab Orders  No laboratory test(s) ordered today   No  images are attached to the encounter or orders placed in the encounter.  Return for Scheduled for October 2024.    Patient to reach out to office if new, worrisome, or unresolved symptoms arise or if no improvement in patient's condition. Patient verbalized understanding and is agreeable to treatment plan. All questions answered to patient's satisfaction.   Treatment plan and recommendation(s) reviewed by supervising preceptor, Thersia CLEMENTEEN Stark, FNP-C, prior to clinic discharge.   Rosina Ada, BSN, RN  DNP Student

## 2023-10-10 NOTE — Progress Notes (Deleted)
 Subjective:   TALEEN PROSSER November 29, 1940 10/10/2023  Chief Complaint  Patient presents with   Medical Management of Chronic Issues    Patient is here to discuss possibly changing doxepin  doses. States while PCP was out of the office, she said the dose was changed by covering provider. Since being on the 6mg , she stated at first she was sleeping better but then recently she states that she has been having problems sleeping.    HPI: BRELEE RENK presents today for re-assessment and management of chronic medical conditions.     The following portions of the patient's history were reviewed and updated as appropriate: past medical history, past surgical history, family history, social history, allergies, medications, and problem list.   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   Grade I diastolic dysfunction 04/04/2023   Acute venous embolism and thrombosis of deep vessels of proximal lower extremity (HCC) 12/04/2022   Snoring 11/13/2022   Depression, recurrent (HCC) 09/28/2022   Tear film insufficiency 04/19/2021   Sjogren's disease (HCC) 01/24/2021   Hereditary and idiopathic neuropathy, unspecified 12/10/2020   Osteoarthritis 12/10/2020   Esophageal dysphagia 09/16/2020   Vitamin D  deficiency 10/01/2019   Polypharmacy 10/01/2019   Major depressive disorder, recurrent episode, moderate (HCC) 10/01/2019   Hx of pulmonary embolus 03/30/2017   Hyperlipidemia 12/26/2013   Encounter for therapeutic drug monitoring 04/16/2013   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   Diabetes type 2, controlled (HCC) 08/27/2008   ANEMIA-NOS 08/27/2008   Seasonal and perennial allergic rhinitis 08/27/2008   Lung nodule 08/27/2008   Fibromyalgia 08/27/2008   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 (HCC) 03/20/2007   Hypothyroidism  03/20/2007   Essential hypertension 03/20/2007   Recurrent pulmonary emboli (HCC) 03/20/2007   GERD 03/20/2007   ESOPHAGEAL STRICTURE 03/08/2007   Past Medical History:  Diagnosis Date   Abnormal gait 12/10/2020   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) 05/15/2022   Anxiety    Breast cancer (HCC)    Chest pain of uncertain etiology 12/25/2018   Chronic osteoarthritis    CKD stage 3b, GFR 30-44 ml/min (HCC) 01/25/2021   Dehydration 09/16/2020   Depression    Diabetes mellitus type II    Diabetes type 2, controlled (HCC) 08/27/2008   Qualifier: Diagnosis of   By: Norleen MD, Lynwood ORN        Elevated lipase 09/16/2020   Essential hypertension 03/20/2007   Qualifier: Diagnosis of   By: Joshua RN, CGRN, Sheri         Exertional dyspnea 11/09/2008   11/20/2017  Walked RA x 3 laps @ 185 ft each stopped due to  End of study, fast pace, no desat, min sob   - Echo 12/05/2017 - LVEF 60-65%, moderate LVH, normal wall motion, grade 1 DD,   indeterminate LV filling pressure, normal GLS at -21%, normal LA   size, trivial TR, RVSP 20 mmHg, normal IVC.           Fibromyalgia    GERD (gastroesophageal reflux disease)    Hyperlipidemia    Hypertension    Hypothyroidism    Leg edema 06/04/2019   Malignant neoplasm of breast (female), unspecified site 1993   L breast s/p mastectomy and tamoxifen x 49yrs   Osteoporosis    Other pulmonary embolism and infarction 2008  and 2009   chronic anticoag - [facility]   Past Surgical History:  Procedure Laterality Date   APPENDECTOMY     BALLOON DILATION N/A 10/18/2020   Procedure: BALLOON DILATION;  Surgeon: Cindie Carlin POUR, DO;  [facility];  Service: Endoscopy;  Laterality: N/A;   BIOPSY  10/18/2020   Procedure: BIOPSY;  Surgeon: Cindie Carlin POUR, DO;  [facility];  Service: Endoscopy;;  gastric    BREAST BIOPSY Left 1993   BREAST BIOPSY Right 09/25/2014   stero. Benign   BREAST RECONSTRUCTION Left    CATARACT  EXTRACTION     ESOPHAGOGASTRODUODENOSCOPY (EGD) WITH PROPOFOL  N/A 10/18/2020   Procedure: ESOPHAGOGASTRODUODENOSCOPY (EGD) WITH PROPOFOL ;  Surgeon: Cindie Carlin POUR, DO;  [facility];  Service: Endoscopy;  Laterality: N/A;  11:00am   MASTECTOMY Left    ROTATOR CUFF REPAIR     SPINAL FUSION      x 2   TOTAL ABDOMINAL HYSTERECTOMY W/ BILATERAL SALPINGOOPHORECTOMY     TUBAL LIGATION     Family History  Problem Relation Age of Onset   Prostate cancer Maternal Uncle    Hypertension Maternal Grandfather    Breast cancer Cousin    Breast cancer Cousin 17   Depression Other        Parent   Arthritis Other        Parent, Grandparent   Hypertension Other        Grandparent   Hyperlipidemia Other        FMH   Miscarriages / Stillbirths Other        Grandparent   Stroke Other        [facility]   Outpatient Medications Prior to Visit  Medication Sig Dispense Refill   ACCU-CHEK GUIDE test strip CHECK BLOOD SUGARS DAILY 100 strip 12   Accu-Chek Softclix Lancets lancets USE AS INSTRUCTED TO TEST SUGARS TWICE A DAY 100 each 12   amLODipine  (NORVASC ) 10 MG tablet TAKE 1 TABLET BY MOUTH EVERY DAY 90 tablet 2   Biotin 1000 MCG tablet Take 1,000 mcg by mouth daily.     Blood Glucose Monitoring Suppl (ACCU-CHEK GUIDE) w/Device KIT 1 each by Does not apply route 2 (two) times daily. To test sugars. Dx. E11.9 1 kit 1   Doxepin  HCl 6 MG TABS Take 1 tablet (6 mg total) by mouth at bedtime as needed (insomnia). 30 tablet 5   DULoxetine  (CYMBALTA ) 30 MG capsule Take 3 capsules (90 mg total) by mouth daily. 270 capsule 3   FARXIGA  5 MG TABS tablet TAKE 1 TABLET (5 MG TOTAL) BY MOUTH DAILY. 30 tablet 2   fenofibrate (TRICOR) 48 MG tablet Take 48 mg by mouth daily.     FLAREX 0.1 % ophthalmic suspension Apply to eye.     furosemide  (LASIX ) 20 MG tablet TAKE 1 TABLET BY MOUTH EVERY DAY AS NEEDED 90 tablet 2   hydrALAZINE  (APRESOLINE ) 50 MG tablet Take 1 tablet (50 mg total) by mouth 2 (two) times  daily. 180 tablet 1   labetalol  (NORMODYNE ) 200 MG tablet TAKE 1 TABLET BY MOUTH TWICE A DAY 180 tablet 2   levothyroxine  (SYNTHROID ) 25 MCG tablet TAKE 1 TABLET BY MOUTH EVERY DAY BEFORE BREAKFAST 90 tablet 3   Magnesium  500 MG TABS Take 500 mg by mouth daily.     rosuvastatin  (CRESTOR ) 10 MG tablet TAKE 1 TABLET BY MOUTH EVERY DAY 90 tablet 1   triamcinolone  ointment (KENALOG ) 0.1 % Apply 1 Application  topically 2 (two) times daily. To affected areas (Back) 60 g 3   valsartan  (DIOVAN ) 320 MG tablet TAKE 1 TABLET BY MOUTH EVERY DAY 90 tablet 2   warfarin (COUMADIN ) 5 MG tablet TAKE 1 (5 MG) TABLET DAILY BY MOUTH EXCEPT TAKE 1/2 TABLET (2.5 MG) ON FRIDAY, SATURDAY, AND SUNDAYS OR AS DIRECTED BY ANTICOAGULATION CLINIC.     No facility-administered medications prior to visit.   Allergies  Allergen Reactions   Lipitor [Atorvastatin]     Unknown   Morphine  Nausea And Vomiting   Meperidine Nausea And Vomiting   Naproxen Sodium Nausea And Vomiting     ROS: A complete ROS was performed with pertinent positives/negatives noted in the HPI. The remainder of the ROS are negative.    Objective:   Today's Vitals   10/10/23 1043  BP: (!) 168/80  Pulse: 62  SpO2: 98%  Weight: 137 lb 1.6 oz (62.2 kg)  Height: 5' 2 (1.575 m)    Physical Exam          GENERAL: Well-appearing, in NAD. Well nourished.  SKIN: Pink, warm and dry. No rash, lesion, ulceration, or ecchymoses.  Head: Normocephalic. NECK: Trachea midline. Full ROM w/o pain or tenderness. No lymphadenopathy.  EARS: Tympanic membranes are intact, translucent without bulging and without drainage. Appropriate landmarks visualized.  EYES: Conjunctiva clear without exudates. EOMI, PERRL, no drainage present.  NOSE: Septum midline w/o deformity. Nares patent, mucosa pink and non-inflamed w/o drainage. No sinus tenderness.  THROAT: Uvula midline. Oropharynx clear. Tonsils non-inflamed without exudate. Mucous membranes pink and moist.   RESPIRATORY: Chest wall symmetrical. Respirations even and non-labored. Breath sounds clear to auscultation bilaterally.  CARDIAC: S1, S2 present, regular rate and rhythm without murmur or gallops. Peripheral pulses 2+ bilaterally.  MSK: Muscle tone and strength appropriate for age. Joints w/o tenderness, redness, or swelling.  EXTREMITIES: Without clubbing, cyanosis, or edema.  NEUROLOGIC: No motor or sensory deficits. Steady, even gait. C2-C12 intact.  PSYCH/MENTAL STATUS: Alert, oriented x 3. Cooperative, appropriate mood and affect.   Health Maintenance Due  Topic Date Due   OPHTHALMOLOGY EXAM  02/15/2022   FOOT EXAM  02/17/2023   HEMOGLOBIN A1C  09/04/2023    No results found for any visits on 10/10/23.  The ASCVD Risk score (Arnett DK, et al., 2019) failed to calculate for the following reasons:   The 2019 ASCVD risk score is only valid for ages 57 to 19     Assessment & Plan:  *** There are no diagnoses linked to this encounter.  No orders of the defined types were placed in this encounter.  Lab Orders  No laboratory test(s) ordered today   No images are attached to the encounter or orders placed in the encounter.  No follow-ups on file.    Patient to reach out to office if new, worrisome, or unresolved symptoms arise or if no improvement in patient's condition. Patient verbalized understanding and is agreeable to treatment plan. All questions answered to patient's satisfaction.    Thersia Schuyler Stark, [place]

## 2023-10-10 NOTE — Progress Notes (Signed)
 Subjective:   Andrea Simmons 10-23-1940 10/10/2023  Chief Complaint  Patient presents with   Medical Management of Chronic Issues    Patient is here to discuss possibly changing doxepin  doses. States while PCP was out of the office, she said the dose was changed by covering provider. Since being on the 6mg , she stated at first she was sleeping better but then recently she states that she has been having problems sleeping.    HPI: Andrea Simmons presents today for re-assessment and management of chronic medical conditions.  INSOMNIA: Andrea Simmons presents for the medical management of Insomnia.  Duration: years Average hours of sleep: 4-5  Difficulty falling asleep: yes Difficulty staying asleep: yes Good sleep hygiene: yes Treatments tried: pt reports trying prescription medications in the past including Lunesta , Trazadone, Elavil, gabapentin . She states a few of these made her too drowsy and too out of it the next day.   She has been taking doxepin  3mg , called this office 3-4 weeks ago d/t sleep disturbances and was told she could increase to 6mg  nightly. She states the 6mg  working for 3-4 nights before she started having trouble sleeping again.   Recent stress: no Daytime sleepiness: yes Apnea: yes Snoring: yes History of sleep study: yes  Pt was dx'ed with mild OSA during May 2025. She reports wearing a small C-pap mask but states she couldn't tolerate it, so she returned it.   The following portions of the patient's history were reviewed and updated as appropriate: past medical history, past surgical history, family history, social history, allergies, medications, and problem list.   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   Grade I diastolic dysfunction 04/04/2023   Acute venous embolism and thrombosis of deep vessels of proximal  lower extremity (HCC) 12/04/2022   Snoring 11/13/2022   Depression, recurrent (HCC) 09/28/2022   Tear film insufficiency 04/19/2021   Sjogren's disease (HCC) 01/24/2021   Hereditary and idiopathic neuropathy, unspecified 12/10/2020   Osteoarthritis 12/10/2020   Esophageal dysphagia 09/16/2020   Vitamin D  deficiency 10/01/2019   Polypharmacy 10/01/2019   Major depressive disorder, recurrent episode, moderate (HCC) 10/01/2019   Hx of pulmonary embolus 03/30/2017   Hyperlipidemia 12/26/2013   Encounter for therapeutic drug monitoring 04/16/2013   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   Diabetes type 2, controlled (HCC) 08/27/2008   ANEMIA-NOS 08/27/2008   Seasonal and perennial allergic rhinitis 08/27/2008   Lung nodule 08/27/2008   Fibromyalgia 08/27/2008   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 (HCC) 03/20/2007   Hypothyroidism 03/20/2007   Essential hypertension 03/20/2007   Recurrent pulmonary emboli (HCC) 03/20/2007   GERD 03/20/2007   ESOPHAGEAL STRICTURE 03/08/2007   Past Medical History:  Diagnosis Date   Abnormal gait 12/10/2020   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) 05/15/2022   Anxiety    Breast cancer (HCC)    Chest pain of uncertain etiology 12/25/2018   Chronic osteoarthritis    CKD stage 3b, GFR 30-44 ml/min (HCC) 01/25/2021   Dehydration 09/16/2020   Depression    Diabetes mellitus type II    Diabetes type 2, controlled (HCC) 08/27/2008   Qualifier: Diagnosis of   By: Norleen MD, Lynwood ORN        Elevated lipase 09/16/2020   Essential hypertension 03/20/2007   Qualifier: Diagnosis of  ByBETHA Molt RN, CGRN, Sheri         Exertional dyspnea 11/09/2008   11/20/2017  Walked RA x 3 laps @ 185 ft each stopped due to  End of study, fast pace, no desat, min sob   - Echo 12/05/2017 - LVEF 60-65%, moderate LVH, normal wall motion, grade 1 DD,   indeterminate LV filling pressure, normal GLS at -21%, normal LA   size,  trivial TR, RVSP 20 mmHg, normal IVC.           Fibromyalgia    GERD (gastroesophageal reflux disease)    Hyperlipidemia    Hypertension    Hypothyroidism    Leg edema 06/04/2019   Malignant neoplasm of breast (female), unspecified site 1993   L breast s/p mastectomy and tamoxifen x 48yrs   Osteoporosis    Other pulmonary embolism and infarction 2008 and 2009   chronic anticoag - [facility]   Past Surgical History:  Procedure Laterality Date   APPENDECTOMY     BALLOON DILATION N/A 10/18/2020   Procedure: BALLOON DILATION;  Surgeon: Cindie Carlin POUR, DO;  [facility];  Service: Endoscopy;  Laterality: N/A;   BIOPSY  10/18/2020   Procedure: BIOPSY;  Surgeon: Cindie Carlin POUR, DO;  [facility]: [facility];  Service: Endoscopy;;  gastric    BREAST BIOPSY Left 1993   BREAST BIOPSY Right 09/25/2014   stero. Benign   BREAST RECONSTRUCTION Left    CATARACT EXTRACTION     ESOPHAGOGASTRODUODENOSCOPY (EGD) WITH PROPOFOL  N/A 10/18/2020   Procedure: ESOPHAGOGASTRODUODENOSCOPY (EGD) WITH PROPOFOL ;  Surgeon: Cindie Carlin POUR, DO;  Location: [facility];  Service: Endoscopy;  Laterality: N/A;  11:00am   MASTECTOMY Left    ROTATOR CUFF REPAIR     SPINAL FUSION      x 2   TOTAL ABDOMINAL HYSTERECTOMY W/ BILATERAL SALPINGOOPHORECTOMY     TUBAL LIGATION     Family History  Problem Relation Age of Onset   Prostate cancer Maternal Uncle    Hypertension Maternal Grandfather    Breast cancer Cousin    Breast cancer Cousin 14   Depression Other        Parent   Arthritis Other        Parent, Grandparent   Hypertension Other        Grandparent   Hyperlipidemia Other        FMH   Miscarriages / Stillbirths Other        Grandparent   Stroke Other        [facility]   Outpatient Medications Prior to Visit  Medication Sig Dispense Refill   ACCU-CHEK GUIDE test strip CHECK BLOOD SUGARS DAILY 100 strip 12   Accu-Chek Softclix Lancets lancets USE AS INSTRUCTED TO TEST SUGARS TWICE A DAY 100 each  12   amLODipine  (NORVASC ) 10 MG tablet TAKE 1 TABLET BY MOUTH EVERY DAY 90 tablet 2   Biotin 1000 MCG tablet Take 1,000 mcg by mouth daily.     Blood Glucose Monitoring Suppl (ACCU-CHEK GUIDE) w/Device KIT 1 each by Does not apply route 2 (two) times daily. To test sugars. Dx. E11.9 1 kit 1   DULoxetine  (CYMBALTA ) 30 MG capsule Take 3 capsules (90 mg total) by mouth daily. 270 capsule 3   FARXIGA  5 MG TABS tablet TAKE 1 TABLET (5 MG TOTAL) BY MOUTH DAILY. 30 tablet 2   fenofibrate (TRICOR) 48 MG tablet Take 48 mg by mouth daily.     FLAREX 0.1 % ophthalmic  suspension Apply to eye.     furosemide  (LASIX ) 20 MG tablet TAKE 1 TABLET BY MOUTH EVERY DAY AS NEEDED 90 tablet 2   hydrALAZINE  (APRESOLINE ) 50 MG tablet Take 1 tablet (50 mg total) by mouth 2 (two) times daily. 180 tablet 1   labetalol  (NORMODYNE ) 200 MG tablet TAKE 1 TABLET BY MOUTH TWICE A DAY 180 tablet 2   levothyroxine  (SYNTHROID ) 25 MCG tablet TAKE 1 TABLET BY MOUTH EVERY DAY BEFORE BREAKFAST 90 tablet 3   Magnesium  500 MG TABS Take 500 mg by mouth daily.     rosuvastatin  (CRESTOR ) 10 MG tablet TAKE 1 TABLET BY MOUTH EVERY DAY 90 tablet 1   triamcinolone  ointment (KENALOG ) 0.1 % Apply 1 Application topically 2 (two) times daily. To affected areas (Back) 60 g 3   valsartan  (DIOVAN ) 320 MG tablet TAKE 1 TABLET BY MOUTH EVERY DAY 90 tablet 2   warfarin (COUMADIN ) 5 MG tablet TAKE 1 (5 MG) TABLET DAILY BY MOUTH EXCEPT TAKE 1/2 TABLET (2.5 MG) ON FRIDAY, SATURDAY, AND SUNDAYS OR AS DIRECTED BY ANTICOAGULATION CLINIC.     Doxepin  HCl 6 MG TABS Take 1 tablet (6 mg total) by mouth at bedtime as needed (insomnia). 30 tablet 5   No facility-administered medications prior to visit.   Allergies  Allergen Reactions   Lipitor [Atorvastatin]     Unknown   Morphine  Nausea And Vomiting   Meperidine Nausea And Vomiting   Naproxen Sodium Nausea And Vomiting    ROS: A complete ROS was performed with pertinent positives/negatives noted in the  HPI. The remainder of the ROS are negative.    Objective:   Today's Vitals   10/10/23 1043  BP: (!) 168/80  Pulse: 62  SpO2: 98%  Weight: 137 lb 1.6 oz (62.2 kg)  Height: 5' 2 (1.575 m)   GENERAL: Well-appearing, in NAD. Well nourished.  SKIN: Pink, warm and dry. No rash, lesion, ulceration, or ecchymoses.  Head: Normocephalic. NECK: Trachea midline. Full ROM w/o pain or tenderness.  RESPIRATORY: Chest wall symmetrical. Respirations even and non-labored.  CARDIAC: Peripheral pulses 2+ bilaterally.  MSK: Muscle tone and strength appropriate for age. Joints w/o tenderness, redness, or swelling.  EXTREMITIES: Without clubbing, cyanosis, or edema.  NEUROLOGIC: No motor or sensory deficits. Steady, even gait. C2-C12 intact.  PSYCH/MENTAL STATUS: Alert, oriented x 3. Cooperative, appropriate mood and affect.   Health Maintenance Due  Topic Date Due   OPHTHALMOLOGY EXAM  02/15/2022   FOOT EXAM  02/17/2023   HEMOGLOBIN A1C  09/04/2023    No results found for any visits on 10/10/23.  The ASCVD Risk score (Arnett DK, et al., 2019) failed to calculate for the following reasons:   The 2019 ASCVD risk score is only valid for ages 51 to 3     Assessment & Plan:  1. Primary insomnia (Primary) Pt advised to stop taking doxepin , and start mirtazapine  7.5mg  at bedtime. Safe use of medication reviewed. Encouraged pt to continue good sleep hygiene, including taking medication 20-30 minutes before laying down, decrease stimulation in the room including lights, TV, and sound, avoid drinking fluids before going to bed.   2. Mild obstructive sleep apnea Pt encouraged to reach out to her pulmonologist regarding different options for C-pap masks.    Meds ordered this encounter  Medications   mirtazapine  (REMERON ) 7.5 MG tablet    Sig: Take 1 tablet (7.5 mg total) by mouth at bedtime.    Dispense:  30 tablet    Refill:  3    Supervising Provider:   DE PERU, RAYMOND J [[id number]]   Lab Orders   No laboratory test(s) ordered today   No images are attached to the encounter or orders placed in the encounter.  Return for Scheduled for October 2024.    Patient to reach out to office if new, worrisome, or unresolved symptoms arise or if no improvement in patient's condition. Patient verbalized understanding and is agreeable to treatment plan. All questions answered to patient's satisfaction.   [employer] FNP-C

## 2023-10-10 NOTE — Patient Instructions (Addendum)
 STOP your DOXEPIN .    Call your Pulmonologist to ask about other options for your CPAP machine.  [facility] [address] [place], [place] [address] [phone]

## 2023-10-11 ENCOUNTER — Ambulatory Visit (HOSPITAL_BASED_OUTPATIENT_CLINIC_OR_DEPARTMENT_OTHER): Payer: Self-pay | Admitting: Family Medicine

## 2023-10-11 ENCOUNTER — Ambulatory Visit: Admitting: Nurse Practitioner

## 2023-10-11 DIAGNOSIS — Z7901 Long term (current) use of anticoagulants: Secondary | ICD-10-CM

## 2023-10-11 LAB — PROTIME-INR
INR: 2.3 — ABNORMAL HIGH (ref 0.9–1.2)
Prothrombin Time: 23.5 s — ABNORMAL HIGH (ref 9.1–12.0)

## 2023-10-11 NOTE — Progress Notes (Signed)
 Please let patient know her INR is stable and has been for now 2 weeks. We can repeat her INR check in 2 weeks please. Continue current dosage instructions.

## 2023-10-16 ENCOUNTER — Encounter: Payer: Self-pay | Admitting: Dermatology

## 2023-10-16 ENCOUNTER — Ambulatory Visit: Admitting: Dermatology

## 2023-10-16 VITALS — BP 105/67 | HR 69

## 2023-10-16 DIAGNOSIS — L308 Other specified dermatitis: Secondary | ICD-10-CM | POA: Diagnosis not present

## 2023-10-16 DIAGNOSIS — D099 Carcinoma in situ, unspecified: Secondary | ICD-10-CM

## 2023-10-16 DIAGNOSIS — L309 Dermatitis, unspecified: Secondary | ICD-10-CM

## 2023-10-16 DIAGNOSIS — D485 Neoplasm of uncertain behavior of skin: Secondary | ICD-10-CM

## 2023-10-16 DIAGNOSIS — C44722 Squamous cell carcinoma of skin of right lower limb, including hip: Secondary | ICD-10-CM | POA: Diagnosis not present

## 2023-10-16 DIAGNOSIS — C4442 Squamous cell carcinoma of skin of scalp and neck: Secondary | ICD-10-CM | POA: Diagnosis not present

## 2023-10-16 DIAGNOSIS — D0471 Carcinoma in situ of skin of right lower limb, including hip: Secondary | ICD-10-CM | POA: Diagnosis not present

## 2023-10-16 DIAGNOSIS — L281 Prurigo nodularis: Secondary | ICD-10-CM | POA: Diagnosis not present

## 2023-10-16 DIAGNOSIS — C4492 Squamous cell carcinoma of skin, unspecified: Secondary | ICD-10-CM

## 2023-10-16 HISTORY — DX: Squamous cell carcinoma of skin, unspecified: C44.92

## 2023-10-16 HISTORY — DX: Carcinoma in situ, unspecified: D09.9

## 2023-10-16 NOTE — Progress Notes (Unsigned)
 Follow-Up Visit   Subjective  Andrea Simmons is a 83 y.o. female who presents for the following: Follow up on treatment for SCCIS.   The following portions of the chart were reviewed this encounter and updated as appropriate: medications, allergies, medical history  Review of Systems:  No other skin or systemic complaints except as noted in HPI or Assessment and Plan.  Objective  Well appearing patient in no apparent distress; mood and affect are within normal limits. Return after Efudex  treatment.   A focused examination was performed of the following areas: Right foot  Relevant exam findings are noted in the Assessment and Plan.  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  NEOPLASM OF UNCERTAIN BEHAVIOR OF SKIN (4) Left Anterior Neck Skin / nail biopsy Type of biopsy: tangential   Informed consent: discussed and consent obtained   Timeout: patient name, date of birth, surgical site, and procedure verified   Procedure prep:  Patient was prepped and draped in usual sterile fashion Prep type:  Isopropyl alcohol  Anesthesia: the lesion was anesthetized in a standard fashion   Anesthetic:  1% lidocaine  w/ epinephrine 1-100,000 buffered w/ 8.4% NaHCO3 Instrument used: DermaBlade   Hemostasis achieved with: aluminum chloride   Outcome: patient tolerated procedure well   Post-procedure details: sterile dressing applied and wound care instructions given   Dressing type: petrolatum gauze and bandage    Specimen 1 - Surgical pathology Differential Diagnosis: r/o NMSC vs other  Check Margins: No Right Ankle - Anterior Skin / nail  biopsy Type of biopsy: tangential   Informed consent: discussed and consent obtained   Timeout: patient name, date of birth, surgical site, and procedure verified   Procedure prep:  Patient was prepped and draped in usual sterile fashion Prep type:  Isopropyl alcohol  Anesthesia: the lesion was anesthetized in a standard fashion   Anesthetic:  1% lidocaine  w/ epinephrine 1-100,000 buffered w/ 8.4% NaHCO3 Instrument used: DermaBlade   Hemostasis achieved with: aluminum chloride   Outcome: patient tolerated procedure well   Post-procedure details: sterile dressing applied and wound care instructions given   Dressing type: petrolatum gauze and bandage    Specimen 2 - Surgical pathology Differential Diagnosis: r/o NMSC vs other    Check Margins: No Left Medial Heel Skin / nail biopsy Type of biopsy: tangential   Informed consent: discussed and consent obtained   Timeout: patient name, date of birth, surgical site, and procedure verified   Procedure prep:  Patient was prepped and draped in usual sterile fashion Prep type:  Isopropyl alcohol  Anesthesia: the lesion was anesthetized in a standard fashion   Anesthetic:  1% lidocaine  w/ epinephrine 1-100,000 buffered w/ 8.4% NaHCO3 Instrument used: DermaBlade   Hemostasis achieved with: aluminum chloride   Outcome: patient tolerated procedure well   Post-procedure details: sterile dressing applied and wound care instructions given   Dressing type: petrolatum gauze and bandage    Specimen 3 - Surgical pathology Differential Diagnosis: r/o NMSC vs other    Check Margins: No Right Lower Leg - Anterior Skin / nail biopsy Type of biopsy: tangential   Informed consent:  discussed and consent obtained   Timeout: patient name, date of birth, surgical site, and procedure verified   Procedure prep:  Patient was prepped and draped in usual sterile fashion Prep type:  Isopropyl alcohol  Anesthesia: the lesion was anesthetized in a standard  fashion   Anesthetic:  1% lidocaine  w/ epinephrine 1-100,000 buffered w/ 8.4% NaHCO3 Instrument used: DermaBlade   Hemostasis achieved with: aluminum chloride   Outcome: patient tolerated procedure well   Post-procedure details: sterile dressing applied and wound care instructions given   Dressing type: petrolatum gauze and bandage    Specimen 4 - Surgical pathology Differential Diagnosis: r/o NMSC vs other    Check Margins: No  Return if symptoms worsen or fail to improve.  I, Berwyn Lesches, Surg Tech III, am acting as scribe for Andrea CHRISTELLA HOLY, MD.   Documentation: I have reviewed the above documentation for accuracy and completeness, and I agree with the above.  Andrea CHRISTELLA HOLY, MD

## 2023-10-16 NOTE — Patient Instructions (Addendum)
 Important Information  Due to recent changes in healthcare laws, you may see results of your pathology and/or laboratory studies on MyChart before the doctors have had a chance to review them. We understand that in some cases there may be results that are confusing or concerning to you. Please understand that not all results are received at the same time and often the doctors may need to interpret multiple results in order to provide you with the best plan of care or course of treatment. Therefore, we ask that you please give [place] 2 business days to thoroughly review all your results before contacting the office for clarification. Should we see a critical lab result, you will be contacted sooner.   If You Need Anything After Your Visit  If you have any questions or concerns for your doctor, please call our main line at [phone] If no one answers, please leave a voicemail as directed and we will return your call as soon as possible. Messages left after 4 pm will be answered the following business day.   You may also send [place] a message via MyChart. We typically respond to MyChart messages within 1-2 business days.  For prescription refills, please ask your pharmacy to contact our office. Our fax number is [phone].  If you have an urgent issue when the clinic is closed that cannot wait until the next business day, you can page your doctor at the number below.    Please note that while we do our best to be available for urgent issues outside of office hours, we are not available 24/7.   If you have an urgent issue and are unable to reach [place], you may choose to seek medical care at your doctor's office, retail clinic, urgent care center, or emergency room.  If you have a medical emergency, please immediately call 911 or go to the emergency department. In the event of inclement weather, please call our main line at [phone] for an update on the status of any delays or  closures.  Dermatology Medication Tips: Please keep the boxes that topical medications come in in order to help keep track of the instructions about where and how to use these. Pharmacies typically print the medication instructions only on the boxes and not directly on the medication tubes.   If your medication is too expensive, please contact our office at [phone] or send [place] a message through MyChart.   We are unable to tell what your co-pay for medications will be in advance as this is different depending on your insurance coverage. However, we may be able to find a substitute medication at lower cost or fill out paperwork to get insurance to cover a needed medication.   If a prior authorization is required to get your medication covered by your insurance company, please allow [place] 1-2 business days to complete this process.  Drug prices often vary depending on where the prescription is filled and some pharmacies may offer cheaper prices.  The website www.goodrx.com contains coupons for medications through different pharmacies. The prices here do not account for what the cost may be with help from insurance (it may be cheaper with your insurance), but the website can give you the price if you did not use any insurance.  - You can print the associated coupon and take it with your prescription to the pharmacy.  - You may also stop by our office during regular business hours and pick up a GoodRx coupon card.  - If  you need your prescription sent electronically to a different pharmacy, notify our office through [facility] or by phone at [phone]    Skin Education :   I counseled the patient regarding the following: Sun screen (SPF 30 or greater) should be applied during peak UV exposure (between 10am and 2pm) and reapplied after exercise or swimming.  The ABCDEs of melanoma were reviewed with the patient, and the importance of monthly self-examination of moles was emphasized.  Should any moles change in shape or color, or itch, bleed or burn, pt will contact our office for evaluation sooner then their interval appointment.  Plan: Sunscreen Recommendations I recommended a broad spectrum sunscreen with a SPF of 30 or higher. I explained that SPF 30 sunscreens block approximately 97 percent of the sun's harmful rays. Sunscreens should be applied at least 15 minutes prior to expected sun exposure and then every 2 hours after that as long as sun exposure continues. If swimming or exercising sunscreen should be reapplied every 45 minutes to an hour after getting wet or sweating. One ounce, or the equivalent of a shot glass full of sunscreen, is adequate to protect the skin not covered by a bathing suit. I also recommended a lip balm with a sunscreen as well. Sun protective clothing can be used in lieu of sunscreen but must be worn the entire time you are exposed to the sun's rays.  Patient Handout: Wound Care for Skin Biopsy Site  Taking Care of Your Skin Biopsy Site  Proper care of the biopsy site is essential for promoting healing and minimizing scarring. This handout provides instructions on how to care for your biopsy site to ensure optimal recovery.  1. Cleaning the Wound:  Clean the biopsy site daily with gentle soap and water. Gently pat the area dry with a clean, soft towel. Avoid harsh scrubbing or rubbing the area, as this can irritate the skin and delay healing.  2. Applying Aquaphor and Bandage:  After cleaning the wound, apply a thin layer of Aquaphor ointment to the biopsy site. Cover the area with a sterile bandage to protect it from dirt, bacteria, and friction. Change the bandage daily or as needed if it becomes soiled or wet.  3. Continued Care for One Week:  Repeat the cleaning, Aquaphor application, and bandaging process daily for one week following the biopsy procedure. Keeping the wound clean and moist during this initial healing period will help  prevent infection and promote optimal healing.  4. Massaging Aquaphor into the Area:  ---After one week, discontinue the use of bandages but continue to apply Aquaphor to the biopsy site. ----Gently massage the Aquaphor into the area using circular motions. ---Massaging the skin helps to promote circulation and prevent the formation of scar tissue.   Additional Tips:  Avoid exposing the biopsy site to direct sunlight during the healing process, as this can cause hyperpigmentation or worsen scarring. If you experience any signs of infection, such as increased redness, swelling, warmth, or drainage from the wound, contact your healthcare provider immediately. Follow any additional instructions provided by your healthcare provider for caring for the biopsy site and managing any discomfort. Conclusion:  Taking proper care of your skin biopsy site is crucial for ensuring optimal healing and minimizing scarring. By following these instructions for cleaning, applying Aquaphor, and massaging the area, you can promote a smooth and successful recovery. If you have any questions or concerns about caring for your biopsy site, don't hesitate to contact your healthcare  provider for guidance.

## 2023-10-18 DIAGNOSIS — H43813 Vitreous degeneration, bilateral: Secondary | ICD-10-CM | POA: Diagnosis not present

## 2023-10-18 DIAGNOSIS — H40021 Open angle with borderline findings, high risk, right eye: Secondary | ICD-10-CM | POA: Diagnosis not present

## 2023-10-18 DIAGNOSIS — Z961 Presence of intraocular lens: Secondary | ICD-10-CM | POA: Diagnosis not present

## 2023-10-18 DIAGNOSIS — E113291 Type 2 diabetes mellitus with mild nonproliferative diabetic retinopathy without macular edema, right eye: Secondary | ICD-10-CM | POA: Diagnosis not present

## 2023-10-19 LAB — SURGICAL PATHOLOGY

## 2023-10-22 ENCOUNTER — Ambulatory Visit: Payer: Self-pay | Admitting: Dermatology

## 2023-10-22 ENCOUNTER — Other Ambulatory Visit: Payer: Self-pay | Admitting: Dermatology

## 2023-10-22 DIAGNOSIS — L281 Prurigo nodularis: Secondary | ICD-10-CM

## 2023-10-22 MED ORDER — TRIAMCINOLONE ACETONIDE 0.1 % EX CREA: 1.0000 | TOPICAL_CREAM | Freq: Every day | CUTANEOUS | 1 refills | Status: AC | PRN

## 2023-10-25 ENCOUNTER — Other Ambulatory Visit (HOSPITAL_BASED_OUTPATIENT_CLINIC_OR_DEPARTMENT_OTHER): Payer: Self-pay | Admitting: *Deleted

## 2023-10-25 DIAGNOSIS — Z7901 Long term (current) use of anticoagulants: Secondary | ICD-10-CM

## 2023-10-26 ENCOUNTER — Ambulatory Visit (HOSPITAL_BASED_OUTPATIENT_CLINIC_OR_DEPARTMENT_OTHER): Payer: Self-pay | Admitting: Family Medicine

## 2023-10-26 DIAGNOSIS — Z7901 Long term (current) use of anticoagulants: Secondary | ICD-10-CM

## 2023-10-26 LAB — PROTIME-INR
INR: 3.1 — ABNORMAL HIGH (ref 0.9–1.2)
Prothrombin Time: 31.4 s — ABNORMAL HIGH (ref 9.1–12.0)

## 2023-10-26 NOTE — Progress Notes (Signed)
 Please call patient and let her know her INR is slightly out of range at 3.1.  Our goal is 2.0-3.0.  Due to this, I would recommend repeating in 1 week and keeping her same warfarin schedule at this time.  With her frequent fluctuations in her warfarin, she may be a better candidate to be monitored by the anticoagulation clinic with [facility] health cardiology.  I would be happy to place as a referral for her.

## 2023-10-31 DIAGNOSIS — Z7901 Long term (current) use of anticoagulants: Secondary | ICD-10-CM | POA: Diagnosis not present

## 2023-10-31 DIAGNOSIS — H59021 Cataract (lens) fragments in eye following cataract surgery, right eye: Secondary | ICD-10-CM | POA: Diagnosis not present

## 2023-10-31 DIAGNOSIS — H4051X1 Glaucoma secondary to other eye disorders, right eye, mild stage: Secondary | ICD-10-CM | POA: Diagnosis not present

## 2023-11-01 ENCOUNTER — Other Ambulatory Visit (HOSPITAL_BASED_OUTPATIENT_CLINIC_OR_DEPARTMENT_OTHER): Payer: Self-pay | Admitting: Family Medicine

## 2023-11-01 LAB — PROTIME-INR
INR: 2.4 — ABNORMAL HIGH (ref 0.9–1.2)
Prothrombin Time: 25.1 s — ABNORMAL HIGH (ref 9.1–12.0)

## 2023-11-01 NOTE — Progress Notes (Signed)
 Please call patient and let her know INR is within normal range.  She can continue on current warfarin dosing.  I would recommend a recheck in 2 weeks.  At this time I would recommend establishing with the anticoagulation clinic for stability of testing and stability of chronic anticoagulation.  I be happy to place a referral for her.

## 2023-11-05 ENCOUNTER — Other Ambulatory Visit (HOSPITAL_BASED_OUTPATIENT_CLINIC_OR_DEPARTMENT_OTHER): Payer: Self-pay | Admitting: Family Medicine

## 2023-11-05 DIAGNOSIS — Z7901 Long term (current) use of anticoagulants: Secondary | ICD-10-CM | POA: Insufficient documentation

## 2023-11-08 ENCOUNTER — Ambulatory Visit: Attending: Cardiovascular Disease | Admitting: *Deleted

## 2023-11-08 ENCOUNTER — Encounter: Payer: Self-pay | Admitting: Dermatology

## 2023-11-08 DIAGNOSIS — I2699 Other pulmonary embolism without acute cor pulmonale: Secondary | ICD-10-CM | POA: Diagnosis not present

## 2023-11-08 DIAGNOSIS — Z86718 Personal history of other venous thrombosis and embolism: Secondary | ICD-10-CM | POA: Diagnosis not present

## 2023-11-08 DIAGNOSIS — Z7901 Long term (current) use of anticoagulants: Secondary | ICD-10-CM | POA: Diagnosis not present

## 2023-11-08 DIAGNOSIS — I824Y9 Acute embolism and thrombosis of unspecified deep veins of unspecified proximal lower extremity: Secondary | ICD-10-CM | POA: Diagnosis not present

## 2023-11-08 LAB — POCT INR: INR: 2.3 (ref 2.0–3.0)

## 2023-11-08 NOTE — Progress Notes (Signed)
 Description   INR 2.3; Continue taking warfarin 1 tablet daily except 1/2 tablet on Saturday and Sunday. Recheck INR in 2 weeks. Anticoagulation Clinic  [phone]

## 2023-11-08 NOTE — Telephone Encounter (Signed)
 Error

## 2023-11-08 NOTE — Patient Instructions (Addendum)
 Description   INR 2.3; Continue taking warfarin 1 tablet daily except 1/2 tablet on Saturday and Sunday. Recheck INR in 2 weeks. Anticoagulation Clinic  [phone]

## 2023-11-14 ENCOUNTER — Ambulatory Visit: Admitting: Dermatology

## 2023-11-14 ENCOUNTER — Encounter: Payer: Self-pay | Admitting: Dermatology

## 2023-11-14 VITALS — BP 123/60 | HR 60 | Temp 98.2°F

## 2023-11-14 DIAGNOSIS — L814 Other melanin hyperpigmentation: Secondary | ICD-10-CM | POA: Diagnosis not present

## 2023-11-14 DIAGNOSIS — L579 Skin changes due to chronic exposure to nonionizing radiation, unspecified: Secondary | ICD-10-CM | POA: Diagnosis not present

## 2023-11-14 DIAGNOSIS — C4442 Squamous cell carcinoma of skin of scalp and neck: Secondary | ICD-10-CM | POA: Diagnosis not present

## 2023-11-14 DIAGNOSIS — C4492 Squamous cell carcinoma of skin, unspecified: Secondary | ICD-10-CM

## 2023-11-14 NOTE — Patient Instructions (Signed)

## 2023-11-14 NOTE — Progress Notes (Signed)
 Follow-Up Visit   Subjective  Andrea Simmons is a 83 y.o. 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 Corey.  The following portions of the chart were reviewed this encounter and updated as appropriate: medications, allergies, medical history  Review of Systems:  No other skin or systemic complaints except as noted in HPI or Assessment and Plan.  Objective  Well appearing patient in no apparent distress; mood and affect are within normal limits.  A focused examination was performed of the following areas: Left anterior neck Relevant physical exam findings are noted in the Assessment and Plan.   Left Anterior Neck Healing biopsy site   Assessment & Plan   SQUAMOUS CELL CARCINOMA OF SKIN Left Anterior Neck Mohs surgery  Consent obtained: written  Anticoagulation: Was the anticoagulation regimen changed prior to Mohs? No    Anesthesia: Anesthesia method: local infiltration Local anesthetic: lidocaine  1% WITH epi  Procedure Details: Timeout: pre-procedure verification complete Procedure Prep: patient was prepped and draped in usual sterile fashion Prep type: chlorhexidine Biopsy accession number: [id number] Pre-Op diagnosis: squamous cell carcinoma SCC subtype: moderately differentiated MohsAIQ Surgical site (if tumor spans multiple areas, please select predominant area): neck Surgery side: left Surgical site (from skin exam): Left Anterior Neck Pre-operative length (cm): 1 Pre-operative width (cm): 0.8 Indications for Mohs surgery: anatomic location where tissue conservation is critical and aggressive histology  Micrographic Surgery Details: Post-operative length (cm): 1.6 Post-operative width (cm): 1.4 Number of Mohs stages: 1 Cumulative additional sections past 5 per stage: 0 Post surgery depth of defect: dermis and subcutaneous fat  Stage 1    Tumor  features identified on Mohs section: no tumor identified  Reconstruction: Was the defect reconstructed? Yes   Was reconstruction performed by the same Mohs surgeon? Yes   Setting of reconstruction: outpatient office When was reconstruction performed? same day Type of reconstruction: linear Linear reconstruction: complex  Skin repair Complexity:  Complex Final length (cm):  4 Informed consent: discussed and consent obtained   Timeout: patient name, date of birth, surgical site, and procedure verified   Procedure prep:  Patient was prepped and draped in usual sterile fashion Prep type:  Chlorhexidine Anesthesia: the lesion was anesthetized in a standard fashion   Anesthetic:  1% lidocaine  w/ epinephrine 1-100,000 buffered w/ 8.4% NaHCO3 Reason for type of repair: reduce tension to allow closure, allow closure of the large defect, preserve normal anatomical and functional relationships, avoid adjacent structures and allow side-to-side closure without requiring a flap or graft   Undermining: area extensively undermined   Subcutaneous layers (deep stitches):  Suture size:  4-0 Suture type: Vicryl (polyglactin 910)   Stitches:  Buried vertical mattress Fine/surface layer approximation (top stitches):  Suture size:  6-0 Suture type: fast-absorbing plain gut   Stitches: simple running   Hemostasis achieved with: suture, pressure and electrodesiccation Outcome: patient tolerated procedure well with no complications   Post-procedure details: sterile dressing applied and wound care instructions given   Dressing type: bandage and petrolatum      No follow-ups on file.  Andrea Simmons, CMA, am acting as scribe for RUFUS CHRISTELLA COREY, MD.    11/14/2023  HISTORY OF PRESENT ILLNESS  Andrea Simmons is seen in consultation at the request of Dr. 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 They note that the area has been present for about  6 months  increasing in size with time.  There is no history of previous treatment.  Reports no other new or changing lesions and has no other complaints today.  Medications and allergies: see patient chart.  Review of systems: Reviewed 8 systems and notable for the above skin cancer.  All other systems reviewed are unremarkable/negative, unless noted in the HPI. Past medical history, surgical history, family history, social history were also reviewed and are noted in the chart/questionnaire.    PHYSICAL EXAMINATION  General: Well-appearing, in no acute distress, alert and oriented x 4. Vitals reviewed in chart (if available).   Skin: Exam reveals a 1.0 x 0.8 cm erythematous papule and biopsy scar on the left anterior neck. There are rhytids, telangiectasias, and lentigines, consistent with photodamage.  Biopsy report(s) reviewed, confirming the diagnosis.   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 Due to location, size, histology, or recurrence and the likelihood of subclinical extension as well as the need to conserve normal surrounding tissue, the patient was deemed acceptable for Mohs micrographic surgery (MMS).  The nature and purpose of the procedure, associated benefits and risks including recurrence and scarring, possible complications such as pain, infection, and bleeding, and alternative methods of treatment if appropriate were discussed with the patient during consent. The lesion location was verified by the patient, by reviewing previous notes, pathology reports, and by photographs as well as angulation measurements if available.  Informed consent was reviewed and signed by the patient, and timeout was performed at 9:00 AM. See op note below.  2. For the photodamage and solar lentigines, sun protection discussed/information given on OTC sunscreens, and we recommend continued regular  follow-up with primary dermatologist every 6 months or sooner for any growing, bleeding, or changing lesions. 3. Prognosis and future surveillance discussed. 4. Letter with treatment outcome sent to referring provider. 5. Pain acetaminophen /ibuprofen  MOHS MICROGRAPHIC SURGERY AND RECONSTRUCTION  Initial size:   1.0 x 0.8 cm Surgical defect/wound size: 1.6 x 1.4 cm Anesthesia:    0.33% lidocaine  with 1:200,000 epinephrine EBL:    <5 mL Complications:  None Repair type:   Complex SQ suture:   4-0 Vicryl Cutaneous suture:  6-0 Plain gut Final size of the repair: 4.0 cm  Stages: 1  STAGE I: Anesthesia achieved with 0.5% lidocaine  with 1:200,000 epinephrine. ChloraPrep applied. 1 section(s) excised using Mohs technique (this includes total peripheral and deep tissue margin excision and evaluation with frozen sections, excised and interpreted by the same physician). The tumor was first debulked and then excised with an approx. 2mm margin.  Hemostasis was achieved with electrocautery as needed.  The specimen was then oriented, subdivided/relaxed, inked, and processed using Mohs technique.    Frozen section analysis revealed a clear deep and peripheral margin.  Reconstruction  The surgical wound was then cleaned, prepped, and re-anesthetized as above. Wound edges were undermined extensively along at least one entire edge and at a distance equal to or greater than the width of the defect (see wound defect size above) in order to achieve closure and decrease wound tension and anatomic distortion. Redundant tissue repair including standing cone removal was performed. Hemostasis was achieved with electrocautery. Subcutaneous and epidermal tissues were approximated with the above sutures. The surgical site was then lightly scrubbed with sterile, saline-soaked gauze. The area was then bandaged using Vaseline ointment, non-adherent gauze, gauze pads, and tape to provide an adequate pressure dressing. The  patient tolerated the procedure well, was given detailed written and verbal wound care instructions, and was discharged in good condition.   The patient will follow-up: 4 weeks.    Documentation: I have reviewed the above documentation for accuracy and completeness, and I agree with the above.  RUFUS CHRISTELLA HOLY, MD

## 2023-11-20 ENCOUNTER — Encounter: Payer: Self-pay | Admitting: Sports Medicine

## 2023-11-20 ENCOUNTER — Other Ambulatory Visit: Payer: Self-pay

## 2023-11-20 ENCOUNTER — Ambulatory Visit: Admitting: Sports Medicine

## 2023-11-20 DIAGNOSIS — M79662 Pain in left lower leg: Secondary | ICD-10-CM | POA: Diagnosis not present

## 2023-11-20 DIAGNOSIS — G8929 Other chronic pain: Secondary | ICD-10-CM | POA: Diagnosis not present

## 2023-11-20 DIAGNOSIS — M545 Low back pain, unspecified: Secondary | ICD-10-CM

## 2023-11-20 DIAGNOSIS — M1612 Unilateral primary osteoarthritis, left hip: Secondary | ICD-10-CM | POA: Diagnosis not present

## 2023-11-20 DIAGNOSIS — M797 Fibromyalgia: Secondary | ICD-10-CM

## 2023-11-20 MED ORDER — METHYLPREDNISOLONE ACETATE 40 MG/ML IJ SUSP
40.0000 mg | INTRAMUSCULAR | Status: AC | PRN
  Administered 2023-11-20: 40 mg via INTRA_ARTICULAR

## 2023-11-20 MED ORDER — LIDOCAINE HCL 1 % IJ SOLN
4.0000 mL | INTRAMUSCULAR | Status: AC | PRN
  Administered 2023-11-20: 4 mL

## 2023-11-20 NOTE — Progress Notes (Signed)
 Andrea Simmons - 83 y.o. female MRN [id number]  Date of birth: August 21, 1940  Office Visit Note: Visit Date: 11/20/2023 PCP: Knute Thersia Bitters, FNP Referred by: Knute Thersia Bitters, *  Subjective: Chief Complaint  Patient presents with   Left Hip - Pain   HPI: Andrea Simmons is a pleasant 83 y.o. female who presents today for acute on chronic left hip/leg pain.  She has had pain about the left hip that has bothered her on and off for the last few months.  She did have an intra-articular injection back in January 2025 which lasted at least 2 or 3 months of very good relief.  She has pain in the back part of the hip that does wraparound to the front.  She also has pain in the left calf that feels like a chronic aching pain.  She denies any numbness or tingling in the leg but does have some tingling in the toes.  She has had to lay on the other side given a Mohs procedure on her neck just recently.  She is using Tylenol , heat.  Lab Results  Component Value Date   HGBA1C 5.3 03/06/2023   Pertinent ROS were reviewed with the patient and found to be negative unless otherwise specified above in HPI.   Assessment & Plan: Visit Diagnoses:  1. Unilateral primary osteoarthritis, left hip   2. Fibromyalgia   3. Pain of left calf   4. Chronic bilateral low back pain, unspecified whether sciatica present    Plan: Impression is chronic left hip pain with an exacerbation of her underlying osteoarthritis.  She does have chronic low back pain with lumbar DDD.  She does have some pain in the left calf but this is more of an achy pain which I believe is secondary to her gait abnormality.  She does not have any radicular symptoms on physical exam and no numbness or tingling other than baseline of her toes.  For both diagnostic and therapeutic purposes, we did proceed with ultrasound-guided left intra-articular hip injection, patient tolerated well.  She will use Tylenol  as well as heat for pain going  forward.  Needs to avoid NSAIDs given her Coumadin  use.  She does have concomitant fibromyalgia which does exacerbate her pain, she may continue her Cymbalta  90 mg daily.  She will follow-up with me as needed.  Follow-up: Return if symptoms worsen or fail to improve.   Meds & Orders: No orders of the defined types were placed in this encounter.   Orders Placed This Encounter  Procedures   Large Joint Inj: L hip joint   US  Guided Needle Placement - No Linked Charges     Procedures: Large Joint Inj: L hip joint on 11/20/2023 1:45 PM Indications: pain Details: 22 G 3.5 in needle, ultrasound-guided anterior approach Medications: 4 mL lidocaine  1 %; 40 mg methylPREDNISolone  acetate 40 MG/ML Outcome: tolerated well, no immediate complications  Procedure: US -guided intra-articular hip injection, left After discussion on risks/benefits/indications and informed verbal consent was obtained, a timeout was performed. Patient was lying supine on exam table. The hip was cleaned with betadine and alcohol  swabs. Then utilizing ultrasound guidance, the patient's femoral head and neck junction was identified and subsequently injected with 4:1 lidocaine :depomedrol via an in-plane approach with ultrasound visualization of the injectate administered into the hip joint. Patient tolerated procedure well without immediate complications.  Procedure, treatment alternatives, risks and benefits explained, specific risks discussed. Consent was given by the patient. Immediately prior to procedure a  time out was called to verify the correct patient, procedure, equipment, support staff and site/side marked as required. Patient was prepped and draped in the usual sterile fashion.          Clinical History: MRI LUMBAR SPINE WITHOUT CONTRAST   TECHNIQUE: Multiplanar, multisequence MR imaging of the lumbar spine was performed. No intravenous contrast was administered.   COMPARISON:  Prior study from 02/25/2019.    FINDINGS: Segmentation: Standard. Lowest well-formed disc space labeled the L5-S1 level.   Alignment: 6 mm facet mediated anterolisthesis of L4 on L5. Trace 2 mm retrolisthesis of T12 on L1 and L1 and L2.   Vertebrae: Compression deformity of the superior endplate of T11 with superimposed endplate Schmorl's node deformity, partially visualized. There is mild marrow edema at the anterior aspect of the partially visualized superior endplate, which could reflect a superimposed acute to subacute component (series 4, image 8). No retropulsion. Vertebral body height otherwise maintained with no other acute or chronic fracture. Bone marrow signal intensity within normal limits. No worrisome osseous lesions. No other abnormal marrow edema.   Conus medullaris and cauda equina: Conus extends to the T12 level. Conus and cauda equina appear normal.   Paraspinal and other soft tissues: Unremarkable.   Disc levels:   T12-L1: Retrolisthesis with mild disc bulge and endplate spurring. Mild facet hypertrophy. No canal or foraminal stenosis.   L1-2: Retrolisthesis with mild disc bulge and endplate spurring. Mild facet hypertrophy. No spinal stenosis. Foramina remain patent.   L2-3: Disc desiccation with minimal disc bulge. Mild to moderate facet and ligament flavum hypertrophy. No significant spinal stenosis. Foramina remain patent.   L3-4: Mild degenerative intervertebral disc space narrowing. Disc desiccation with mild disc bulge. Moderate bilateral facet hypertrophy. No significant spinal stenosis. Foramina remain patent.   L4-5: 6 mm facet mediated anterolisthesis. Disc desiccation with associated broad posterior pseudo disc bulge/uncovering. Moderate to advanced bilateral facet arthrosis. Resultant moderate to severe canal with bilateral subarticular stenosis, with mild bilateral L4 foraminal narrowing.   L5-S1: Disc desiccation with mild disc bulge. Superimposed right foraminal  disc protrusion with annular fissure contacts the exiting right L5 nerve root (series 8, image 5). Moderate bilateral facet hypertrophy. Resultant mild bilateral subarticular stenosis. Central canal remains patent. Moderate right L5 foraminal narrowing. Left or pharyngeal veins patent.   IMPRESSION: 1. 6 mm facet mediated anterolisthesis of L4 on L5 with resultant moderate to severe canal and bilateral subarticular stenosis, with mild bilateral L4 foraminal narrowing. 2. Right foraminal disc protrusion at L5-S1, contacting and potentially irritating the exiting right L5 nerve root. 3. Additional mild for age spondylosis elsewhere within the lumbar spine as above. No other significant stenosis or neural impingement. 4. Compression deformity of the superior endplate of T11 with superimposed endplate Schmorl's node deformity, partially visualized. There is mild marrow edema at the anterior aspect of the partially visualized superior endplate, which could reflect a superimposed acute to subacute fracture component. Correlation with physical exam for possible pain at this location recommended.     Electronically Signed   By: Morene Hoard M.D.   On: 04/01/2022 19:49  She reports that she has never smoked. She has never been exposed to tobacco smoke. She has never used smokeless tobacco.  Recent Labs    03/06/23 1629  HGBA1C 5.3    Objective:    Physical Exam  Gen: Well-appearing, in no acute distress; non-toxic CV: Well-perfused. Warm.  Resp: Breathing unlabored on room air; no wheezing. Psych: Fluid speech in conversation; appropriate  affect; normal thought process  Ortho Exam - Left hip:  + C sign on exam.  There is minimal tenderness of bilateral greater trochanteric regions.  There is pain with all planes with internal/external and flexion about the hip joint both over the anterior and posterior hip.  Positive Stinchfield and positive FADIR testing.  Patient walks with  a reduced hip swing on the left compared to right.  - Lumbar: No midline spinous process TTP.  Negative straight leg raise bilaterally.  Imaging:  *I did review the left hip x-ray, the lumbar spine x-ray and the MRI lumbar spine as below and above during today's office visit.  04/16/23: 2 views of the left hip including AP and lateral film were ordered and  reviewed by myself.  There is moderate to severe bilateral hip  osteoarthritic change.  The left hip has a degree of subchondral sclerosis  inferiorly as well as near the region of the acetabular rim/femoral head  juncture.  No acute fracture noted.   Narrative & Impression  CLINICAL DATA:  Back pain   EXAM: LUMBAR SPINE - COMPLETE 4+ VIEW   COMPARISON:  04/01/2022   FINDINGS: There is no evidence of lumbar spine fracture. Mild levocurvature. Grade 1 anterolisthesis of L4 on L5. Trace retrolisthesis at L2-3 and L3-4. No dynamic instability. Mild disc height loss is most pronounced at L4-L5. Advanced multilevel degenerative facet arthropathy. Aortic atherosclerosis.   IMPRESSION: Multilevel degenerative changes of the lumbar spine, most pronounced at L4-L5.     Electronically Signed   By: Mabel Converse D.O.   On: 03/24/2023 10:49    Past Medical/Family/Surgical/Social History: Medications & Allergies reviewed per EMR, new medications updated. Patient Active Problem List   Diagnosis Date Noted   Chronic anticoagulation 11/05/2023   Eczema 07/31/2023   CKD stage 3a, GFR 45-59 ml/min (HCC) 07/31/2023   Mild obstructive sleep apnea 07/23/2023   Grade I diastolic dysfunction 04/04/2023   Acute venous embolism and thrombosis of deep vessels of proximal lower extremity (HCC) 12/04/2022   Snoring 11/13/2022   Depression, recurrent (HCC) 09/28/2022   Tear film insufficiency 04/19/2021   Sjogren's disease (HCC) 01/24/2021   Hereditary and idiopathic neuropathy, unspecified 12/10/2020   Osteoarthritis 12/10/2020    Esophageal dysphagia 09/16/2020   Vitamin D  deficiency 10/01/2019   Polypharmacy 10/01/2019   Major depressive disorder, recurrent episode, moderate (HCC) 10/01/2019   Hx of pulmonary embolus 03/30/2017   Hyperlipidemia 12/26/2013   Encounter for therapeutic drug monitoring 04/16/2013   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   Diabetes type 2, controlled (HCC) 08/27/2008   ANEMIA-NOS 08/27/2008   Seasonal and perennial allergic rhinitis 08/27/2008   Lung nodule 08/27/2008   Fibromyalgia 08/27/2008   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 (HCC) 03/20/2007   Hypothyroidism 03/20/2007   Essential hypertension 03/20/2007   Recurrent pulmonary emboli (HCC) 03/20/2007   GERD 03/20/2007   ESOPHAGEAL STRICTURE 03/08/2007   Past Medical History:  Diagnosis Date   Abnormal gait 12/10/2020   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) 05/15/2022   Anxiety    Breast cancer (HCC)    Chest pain of uncertain etiology 12/25/2018   Chronic osteoarthritis    CKD stage 3b, GFR 30-44 ml/min (HCC) 01/25/2021   Dehydration 09/16/2020   Depression    Diabetes mellitus type II    Diabetes type 2, controlled (HCC) 08/27/2008   Qualifier: Diagnosis of   By: Norleen MD, [place] ORN  Elevated lipase 09/16/2020   Essential hypertension 03/20/2007   Qualifier: Diagnosis of   By: Joshua RN, CGRN, Sheri         Exertional dyspnea 11/09/2008   11/20/2017  Walked RA x 3 laps @ 185 ft each stopped due to  End of study, fast pace, no desat, min sob   - Echo 12/05/2017 - LVEF 60-65%, moderate LVH, normal wall motion, grade 1 DD,   indeterminate LV filling pressure, normal GLS at -21%, normal LA   size, trivial TR, RVSP 20 mmHg, normal IVC.           Fibromyalgia    GERD (gastroesophageal reflux disease)    Hyperlipidemia    Hypertension    Hypothyroidism    Leg edema 06/04/2019   Malignant neoplasm of breast (female), unspecified site 1993   L breast s/p mastectomy  and tamoxifen x 7yrs   Osteoporosis    Other pulmonary embolism and infarction 2008 and 2009   chronic anticoag - LeB CC   Squamous cell carcinoma of skin    Family History  Problem Relation Age of Onset   Prostate cancer Maternal Uncle    Hypertension Maternal Grandfather    Breast cancer Cousin    Breast cancer Cousin 79   Depression Other        Parent   Arthritis Other        Parent, Grandparent   Hypertension Other        Grandparent   Hyperlipidemia Other        FMH   Miscarriages / Stillbirths Other        Grandparent   Stroke Other        [facility]   Past Surgical History:  Procedure Laterality Date   APPENDECTOMY     BALLOON DILATION N/A 10/18/2020   Procedure: BALLOON DILATION;  Surgeon: Cindie Carlin POUR, DO;  Location: [facility];  Service: Endoscopy;  Laterality: N/A;   BIOPSY  10/18/2020   Procedure: BIOPSY;  Surgeon: Cindie Carlin POUR, DO;  Location: [facility];  Service: Endoscopy;;  gastric    BREAST BIOPSY Left 1993   BREAST BIOPSY Right 09/25/2014   stero. Benign   BREAST RECONSTRUCTION Left    CATARACT EXTRACTION     ESOPHAGOGASTRODUODENOSCOPY (EGD) WITH PROPOFOL  N/A 10/18/2020   Procedure: ESOPHAGOGASTRODUODENOSCOPY (EGD) WITH PROPOFOL ;  Surgeon: Cindie Carlin POUR, DO;  Location: [facility];  Service: Endoscopy;  Laterality: N/A;  11:00am   MASTECTOMY Left    ROTATOR CUFF REPAIR     SPINAL FUSION      x 2   TOTAL ABDOMINAL HYSTERECTOMY W/ BILATERAL SALPINGOOPHORECTOMY     TUBAL LIGATION     Social History   Occupational History   Occupation: Software engineer: RETIRED   Occupation: Producer, television/film/video   Occupation: school bus driver  Tobacco Use   Smoking status: Never    Passive exposure: Never   Smokeless tobacco: Never  Vaping Use   Vaping status: Never Used  Substance and Sexual Activity   Alcohol  use: No    Alcohol /week: 0.0 standard drinks of alcohol    Drug use: No   Sexual activity: Not Currently

## 2023-11-20 NOTE — Progress Notes (Signed)
 Patient says that her hip has hurt off and on for awhile, but her current flare began on Thursday of last week. She says that her pain is more in the back of the hip, and she has pain in the left lower leg. She also has tingling in the toes. She says that this pain went down the entire leg previously, although now it is not in the thigh. She has been taking Tylenol  with little relief. She says that laying down is more painful than standing, and she has had to lay on her back rather than her left side, like usual, as she had skin cancer removed on the left side of her neck. She would like to avoid surgery if possible, but would like pain relief.

## 2023-11-21 ENCOUNTER — Encounter: Payer: Self-pay | Admitting: Dermatology

## 2023-11-22 ENCOUNTER — Encounter: Payer: Self-pay | Admitting: Dermatology

## 2023-11-22 ENCOUNTER — Ambulatory Visit: Attending: Cardiovascular Disease | Admitting: *Deleted

## 2023-11-22 DIAGNOSIS — I2699 Other pulmonary embolism without acute cor pulmonale: Secondary | ICD-10-CM | POA: Diagnosis not present

## 2023-11-22 DIAGNOSIS — Z5181 Encounter for therapeutic drug level monitoring: Secondary | ICD-10-CM | POA: Diagnosis not present

## 2023-11-22 DIAGNOSIS — Z86718 Personal history of other venous thrombosis and embolism: Secondary | ICD-10-CM

## 2023-11-22 LAB — POCT INR: POC INR: 2.9

## 2023-11-22 NOTE — Progress Notes (Signed)
 INR 2.9; Please see anticoagulation encounter Lab Results  Component Value Date   INR 2.9 11/22/2023   INR 2.3 11/08/2023   INR 2.4 (H) 10/31/2023    Description   INR 2.9; Continue taking warfarin 1 tablet daily except 1/2 tablet on Saturday and Sunday. Recheck INR in 3 weeks. Anticoagulation Clinic  [phone]

## 2023-11-22 NOTE — Patient Instructions (Signed)
 Description   INR 2.9; Continue taking warfarin 1 tablet daily except 1/2 tablet on Saturday and Sunday. Recheck INR in 3 weeks. Anticoagulation Clinic  [phone]

## 2023-11-26 ENCOUNTER — Telehealth: Payer: Self-pay | Admitting: Sports Medicine

## 2023-11-26 NOTE — Telephone Encounter (Signed)
 Pt called requesting a call from [place]. She states she is still having lots of pain after injection and asking for a call back. Pt number is [phone].

## 2023-11-28 ENCOUNTER — Ambulatory Visit: Admitting: Dermatology

## 2023-11-28 ENCOUNTER — Encounter: Payer: Self-pay | Admitting: Dermatology

## 2023-11-28 VITALS — BP 131/62 | HR 60 | Temp 97.8°F

## 2023-11-28 DIAGNOSIS — L905 Scar conditions and fibrosis of skin: Secondary | ICD-10-CM | POA: Diagnosis not present

## 2023-11-28 DIAGNOSIS — C4492 Squamous cell carcinoma of skin, unspecified: Secondary | ICD-10-CM

## 2023-11-28 DIAGNOSIS — C44722 Squamous cell carcinoma of skin of right lower limb, including hip: Secondary | ICD-10-CM

## 2023-11-28 DIAGNOSIS — D099 Carcinoma in situ, unspecified: Secondary | ICD-10-CM

## 2023-11-28 DIAGNOSIS — D0471 Carcinoma in situ of skin of right lower limb, including hip: Secondary | ICD-10-CM

## 2023-11-28 MED ORDER — MUPIROCIN 2 % EX OINT: 1.0000 | TOPICAL_OINTMENT | Freq: Two times a day (BID) | CUTANEOUS | 3 refills | Status: AC

## 2023-11-28 NOTE — Progress Notes (Unsigned)
   Follow-Up Visit   Subjective  Andrea Simmons is a 83 y.o. 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    The following portions of the chart were reviewed this encounter and updated as appropriate: medications, allergies, medical history  Review of Systems:  No other skin or systemic complaints except as noted in HPI or Assessment and Plan.  Objective  Well appearing patient in no apparent distress; mood and affect are within normal limits.  A focused examination was performed of the following areas: Right lower leg-anterior Relevant physical exam findings are noted in the Assessment and Plan.     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  - Reassured that wound has healed well - Discussed that scars take up to 12 months to mature from the date of surgery - Recommend SPF 30+ to scar daily to prevent purple color - OK to start scar massage at 4-6 weeks post-op - Can consider silicone based products for scar healing   SQUAMOUS CELL CARCINOMA OF SKIN Right Lower Leg - Anterior Mohs surgery  Consent obtained: written  Anticoagulation: Is the patient taking prescription anticoagulant and/or aspirin prescribed/recommended by a physician? Yes   Was the anticoagulation regimen changed prior to Mohs? No    Procedure Details: Timeout: pre-procedure verification complete Procedure Prep: patient was prepped and draped in usual sterile fashion Prep type: chlorhexidine Biopsy accession number: [id number] Frozen section biopsy performed: No   Specimen debulked: No   Pre-Op diagnosis: squamous cell carcinoma SCC subtype: moderately  differentiated Surgical site (from skin exam): Right Lower Leg - Anterior Pre-operative length (cm): 1.2 Pre-operative width (cm): 1 Indications for Mohs surgery: anatomic location where tissue conservation is critical Previously treated? No    Micrographic Surgery Details: Post-operative length (cm): 2 Post-operative width (cm): 2.4 Number of Mohs stages: 2 Post surgery depth of defect: subcutaneous fat  Stage 1    Tumor features identified on Mohs section: squamous cell carcinoma    Depth of tumor invasion after stage: subcutaneous fat    Perineural invasion: no perineural invasion  Stage 2    Tumor features identified on Mohs section: no tumor identified    Depth of tumor invasion after stage: subcutaneous fat  Patient tolerance of procedure: tolerated well, no immediate complications  Reconstruction: Was the defect reconstructed?: No    Opioids: Did the patient receive a prescription for opioid/narcotic related to Mohs surgery?: No    Antibiotics: Does patient meet AHA guidelines for endocarditis?: No   Does patient meet AHA guidelines for orthopedic prophylaxis?: No   Were antibiotics given on the day of surgery?: No   Did surgery breach mucosa, expose cartilage/bone, involve an area of lymphedema/inflamed/infected tissue? No      Return in 2 weeks (on 12/12/2023) for Next Mohs.  I, Darice Smock, CMA, am acting as scribe for RUFUS CHRISTELLA HOLY, MD.    Documentation: I have reviewed the above documentation for accuracy and completeness, and I agree with the above.  RUFUS CHRISTELLA HOLY, MD

## 2023-11-28 NOTE — Patient Instructions (Signed)

## 2023-12-06 ENCOUNTER — Encounter: Payer: Self-pay | Admitting: Dermatology

## 2023-12-07 ENCOUNTER — Other Ambulatory Visit (HOSPITAL_BASED_OUTPATIENT_CLINIC_OR_DEPARTMENT_OTHER): Payer: Self-pay | Admitting: *Deleted

## 2023-12-07 DIAGNOSIS — N184 Chronic kidney disease, stage 4 (severe): Secondary | ICD-10-CM

## 2023-12-07 MED ORDER — DAPAGLIFLOZIN PROPANEDIOL 5 MG PO TABS: 5.0000 mg | ORAL_TABLET | Freq: Every day | ORAL | 3 refills | Status: DC

## 2023-12-12 ENCOUNTER — Ambulatory Visit: Admitting: Dermatology

## 2023-12-12 ENCOUNTER — Encounter: Payer: Self-pay | Admitting: Dermatology

## 2023-12-12 VITALS — BP 111/61 | HR 58 | Temp 98.4°F

## 2023-12-12 DIAGNOSIS — S81802D Unspecified open wound, left lower leg, subsequent encounter: Secondary | ICD-10-CM | POA: Diagnosis not present

## 2023-12-12 DIAGNOSIS — T1490XD Injury, unspecified, subsequent encounter: Secondary | ICD-10-CM

## 2023-12-12 DIAGNOSIS — L814 Other melanin hyperpigmentation: Secondary | ICD-10-CM

## 2023-12-12 DIAGNOSIS — L579 Skin changes due to chronic exposure to nonionizing radiation, unspecified: Secondary | ICD-10-CM | POA: Diagnosis not present

## 2023-12-12 DIAGNOSIS — D0471 Carcinoma in situ of skin of right lower limb, including hip: Secondary | ICD-10-CM | POA: Diagnosis not present

## 2023-12-12 DIAGNOSIS — Z85828 Personal history of other malignant neoplasm of skin: Secondary | ICD-10-CM

## 2023-12-12 DIAGNOSIS — L905 Scar conditions and fibrosis of skin: Secondary | ICD-10-CM

## 2023-12-12 DIAGNOSIS — C4492 Squamous cell carcinoma of skin, unspecified: Secondary | ICD-10-CM

## 2023-12-12 NOTE — Progress Notes (Signed)
 Follow-Up Visit   Subjective  Andrea Simmons is a 83 y.o. female who presents for the following: Mohs of Squamous Carcinoma in Situ On the right ankle anterior.   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  Objective  Well appearing patient in no apparent distress; mood and affect are within normal limits.  A focused examination was performed of the following areas: Right ankle anterior Relevant physical exam findings are noted in the Assessment and Plan.   Right Ankle - Anterior Hypertrophic papule   Assessment & Plan   SQUAMOUS CELL CARCINOMA OF SKIN Right Ankle - Anterior Mohs surgery  Consent obtained: written  Anticoagulation: Is the patient taking prescription anticoagulant and/or aspirin prescribed/recommended by a physician? Yes   Was the anticoagulation regimen changed prior to Mohs? No    Anesthesia: Anesthesia method: local infiltration Local anesthetic: lidocaine  1% WITH epi  Procedure Details: Timeout: pre-procedure verification complete Procedure Prep: patient was prepped and draped in usual sterile fashion Prep type: chlorhexidine Biopsy accession number: [id number] Frozen section biopsy performed: No   Specimen debulked: Yes   Pre-Op diagnosis: squamous cell carcinoma SCC subtype: in situ MohsAIQ Surgical site (if tumor spans multiple areas, please select predominant area): lower limb (including hip) Surgery side: right Surgical  site (from skin exam): Right Ankle - Anterior Pre-operative length (cm): 1.2 Pre-operative width (cm): 1.1 Indications for Mohs surgery: anatomic location where tissue conservation is critical Previously treated? 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: No   Does patient meet AHA guidelines for orthopedic prophylaxis?: No   Were antibiotics given on the day of surgery? Yes   When were antibiotics given? post-operative Did surgery breach mucosa, expose cartilage/bone, involve an area of lymphedema/inflamed/infected tissue? No   Indication for post-operative antibiotics: anatomic location   Scar s/p Mohs for a SCC on the Left Anterior Neck, treated on 11/14/2023, repaired with a complex linear closure.  - Reassured that wound has healed well - Discussed that scars take up to 12 months to mature from the date of surgery - Recommend SPF 30+ to scar daily to prevent purple color - OK to start scar massage  at 4-6 weeks post-op - Can consider silicone based products for scar healing   Healing Wound s/p Mohs for  an SCC on the left lower leg, treated on 11/28/2023, healing by second intention - Reassured that wound has healed well - Discussed that scars take up to 12 months to mature from the date of surgery - Recommend SPF 30+ to scar daily to prevent purple color - OK to start scar massage at 4-6 weeks post-op - Can consider silicone based products for scar healing  Return in about 4 weeks (around 01/09/2024) for Mohs fu.  I, Berwyn Lesches, Surg Tech III, am acting as scribe for Andrea CHRISTELLA HOLY, MD.    12/12/2023  HISTORY OF PRESENT ILLNESS  Andrea Simmons is seen in consultation at the request of Dr. HOLY for biopsy-proven SCCIS of the right ankle (inferior). They note that the area has been present for about 6 months increasing in size with time.  There is no history of previous treatment.  Reports no other new or changing lesions and has no other complaints today.  Medications and allergies: see patient chart.  Review of systems: Reviewed 8 systems and notable for the above skin cancer.  All other systems reviewed are unremarkable/negative, unless noted in the HPI. Past medical history, surgical history, family history, social history were also reviewed and are noted in the chart/questionnaire.    PHYSICAL EXAMINATION  General: Well-appearing, in no acute distress, alert and oriented x 4. Vitals reviewed in chart (if available).   Skin: Exam reveals a 1.2 x 1.1 cm erythematous papule and biopsy scar on the right ankle anterior. There are rhytids, telangiectasias, and lentigines, consistent with photodamage.  Biopsy report(s) reviewed, confirming the diagnosis.   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 Due to location, size, histology, or recurrence and the likelihood of subclinical extension as well as the need to conserve normal surrounding tissue,  the patient was deemed acceptable for Mohs micrographic surgery (MMS).  The nature and purpose of the procedure, associated benefits and risks including recurrence and scarring, possible complications such as pain, infection, and bleeding, and alternative methods of treatment if appropriate were discussed with the patient during consent. The lesion location was verified by the patient, by reviewing previous notes, pathology reports, and by photographs as well as angulation measurements if available.  Informed consent was reviewed and signed by the patient, and timeout was performed at 10:15 AM. See op note below.  2. For the photodamage and solar lentigines, sun protection discussed/information given on OTC sunscreens, and we recommend continued regular follow-up with primary dermatologist every 6 months or sooner for any growing, bleeding, or changing lesions. 3. Prognosis and future surveillance discussed. 4. Letter with treatment outcome sent to referring provider. 5. Pain acetaminophen /ibuprofen  MOHS MICROGRAPHIC SURGERY AND RECONSTRUCTION  Initial size:   1.2 x 1.1 cm Surgical defect/wound size: 2.1 x 1.9 cm Anesthesia:    0.33% lidocaine  with 1:200,000 epinephrine EBL:    <5 mL Complications:  None Repair type:   Second Intention  Stages: 2  STAGE I: Anesthesia achieved with 0.5% lidocaine  with 1:200,000 epinephrine. ChloraPrep applied. 1 section(s) excised using Mohs technique (this includes total peripheral and deep tissue margin excision and evaluation with frozen sections, excised and interpreted by the same physician). The tumor was first debulked and then excised with an approx. 2  mm margin.  Hemostasis was achieved with electrocautery as needed.  The specimen was then oriented, subdivided/relaxed, inked, and processed using Mohs technique.    Frozen section analysis revealed a positive margin for multiple, small buds of basaloid cells descending from the epidermis with no dermal  invasion in the peripheral margin.    STAGE II: An additional 2 mm margin was excised.  Hemostasis was achieved with electrocautery as needed.  The specimen was then oriented, subdivided/relaxed, inked, and processed using Mohs technique. Evaluation of slides by the Mohs surgeon revealed clear tumor margins.   Reconstruction  Patient was notified of results and repair options were discussed, including second intention healing. After reviewing the advantages and disadvantages of each, we agreed on second intention healing as appropriate.   The surgical site was then lightly scrubbed with sterile, saline-soaked gauze.  The area was bandaged using Vaseline ointment, non-adherent gauze, gauze pads, and tape to provide an adequate pressure dressing.   The patient tolerated the procedure well, was given detailed written and verbal wound care instructions, and was discharged in good condition.  The patient will follow-up in 4 weeks and as scheduled with primary dermatologist.    Documentation: I have reviewed the above documentation for accuracy and completeness, and I agree with the above.  Andrea CHRISTELLA HOLY, MD

## 2023-12-12 NOTE — Patient Instructions (Signed)

## 2023-12-13 ENCOUNTER — Ambulatory Visit

## 2023-12-13 ENCOUNTER — Encounter: Payer: Self-pay | Admitting: Dermatology

## 2023-12-14 ENCOUNTER — Other Ambulatory Visit (HOSPITAL_BASED_OUTPATIENT_CLINIC_OR_DEPARTMENT_OTHER): Payer: Self-pay | Admitting: Family Medicine

## 2023-12-14 DIAGNOSIS — E782 Mixed hyperlipidemia: Secondary | ICD-10-CM

## 2023-12-17 ENCOUNTER — Ambulatory Visit

## 2023-12-19 ENCOUNTER — Ambulatory Visit: Attending: Cardiovascular Disease | Admitting: Pharmacist

## 2023-12-19 DIAGNOSIS — I2699 Other pulmonary embolism without acute cor pulmonale: Secondary | ICD-10-CM | POA: Diagnosis not present

## 2023-12-19 DIAGNOSIS — Z7901 Long term (current) use of anticoagulants: Secondary | ICD-10-CM | POA: Diagnosis not present

## 2023-12-19 DIAGNOSIS — Z86718 Personal history of other venous thrombosis and embolism: Secondary | ICD-10-CM

## 2023-12-19 DIAGNOSIS — I824Y9 Acute embolism and thrombosis of unspecified deep veins of unspecified proximal lower extremity: Secondary | ICD-10-CM

## 2023-12-19 LAB — POCT INR
INR: 2.5 (ref 2.0–3.0)
INR: 2.9 (ref 2.0–3.0)

## 2023-12-19 NOTE — Patient Instructions (Signed)
 Description   INR 2.5; Continue taking warfarin 1 tablet daily except 1/2 tablet on Saturday and Sunday. Recheck INR in 4 weeks. Anticoagulation Clinic  [phone]

## 2023-12-19 NOTE — Progress Notes (Signed)
 Description   INR 2.5; Continue taking warfarin 1 tablet daily except 1/2 tablet on Saturday and Sunday. Recheck INR in 4 weeks. Anticoagulation Clinic  [phone]

## 2023-12-24 ENCOUNTER — Ambulatory Visit (HOSPITAL_BASED_OUTPATIENT_CLINIC_OR_DEPARTMENT_OTHER): Admitting: Family Medicine

## 2024-01-09 ENCOUNTER — Ambulatory Visit (INDEPENDENT_AMBULATORY_CARE_PROVIDER_SITE_OTHER): Admitting: Dermatology

## 2024-01-09 VITALS — BP 109/64 | HR 67

## 2024-01-09 DIAGNOSIS — D485 Neoplasm of uncertain behavior of skin: Secondary | ICD-10-CM | POA: Diagnosis not present

## 2024-01-09 DIAGNOSIS — Z85828 Personal history of other malignant neoplasm of skin: Secondary | ICD-10-CM

## 2024-01-09 DIAGNOSIS — I824Y9 Acute embolism and thrombosis of unspecified deep veins of unspecified proximal lower extremity: Secondary | ICD-10-CM

## 2024-01-09 DIAGNOSIS — S91001D Unspecified open wound, right ankle, subsequent encounter: Secondary | ICD-10-CM

## 2024-01-09 DIAGNOSIS — C4492 Squamous cell carcinoma of skin, unspecified: Secondary | ICD-10-CM

## 2024-01-09 DIAGNOSIS — T1490XD Injury, unspecified, subsequent encounter: Secondary | ICD-10-CM

## 2024-01-09 DIAGNOSIS — Z7901 Long term (current) use of anticoagulants: Secondary | ICD-10-CM

## 2024-01-09 DIAGNOSIS — D0472 Carcinoma in situ of skin of left lower limb, including hip: Secondary | ICD-10-CM | POA: Diagnosis not present

## 2024-01-09 NOTE — Progress Notes (Unsigned)
 Follow Up Visit   Subjective  Andrea Simmons is a 83 y.o. female who presents for the following: follow up from Mohs surgery   The patient presents for follow up from Mohs surgery for a SCC on the right anterior ankle, treated on 12/12/2023, healing by second intention. . The patient has been bandaging the wound as directed. The endorse the following concerns: none.   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   The following portions of the chart were reviewed this encounter and updated as appropriate: medications, allergies, medical history  Review of Systems:  No other skin or systemic complaints except as noted in HPI or Assessment and Plan.  Objective  Well appearing patient in no apparent distress; mood and affect are within normal limits.  A focal examination was performed including the right leg. All findings within normal limits unless otherwise noted below.  Healing wound with mild erythema  Relevant physical exam findings are noted in the Assessment and Plan.      Left Lower Leg - Anterior 9mm pink scaly papule   Assessment & Plan   Healing Wound s/p Mohs for SCC on the right ankle-anterior, treated on 12/12/2023, healing by second intention.  - Reassured that wound is healing well - No evidence of infection - No swelling, induration, purulence, dehiscence, or tenderness out of proportion to the clinical exam, see photo above - Discussed that scars take up to 12 months to mature from the date of surgery - Recommend SPF 30+ to scar daily to prevent purple color from UV exposure during scar maturation process - Discussed that erythema and raised appearance of scar will fade over the next 4-6 months - OK to start scar massage at 4-6 weeks post-op - Can consider silicone based products for scar healing starting at 6 weeks post-op - Ok to continue ointment  daily to wound under a bandage for another 4  Healing Wound s/p Mohs for an SCC on the left lower leg, treated on 11/28/2023, healing by second intention - Reassured that wound has healed well - Discussed that scars take up to 12 months to mature from the date of surgery - Recommend SPF 30+ to scar daily to prevent purple color - OK to start scar massage at 4-6 weeks post-op - Can consider silicone based products for scar healing  HISTORY OF SQUAMOUS CELL CARCINOMA OF THE SKIN - No evidence of recurrence today - No lymphadenopathy - Recommend regular full body skin exams - Recommend daily broad spectrum sunscreen SPF 30+ to sun-exposed areas, reapply every 2 hours as needed.  - Call if any new or changing lesions are noted between office visits  NEOPLASM OF UNCERTAIN BEHAVIOR OF SKIN Left Lower Leg - Anterior Skin / nail biopsy Type of biopsy: tangential   Informed consent: discussed and consent obtained   Timeout: patient name, date of birth, surgical site, and procedure verified   Procedure prep:  Patient was prepped and draped in usual sterile fashion Prep type:  Isopropyl alcohol  Anesthesia: the lesion was anesthetized in a standard fashion   Anesthetic:  1% lidocaine  w/ epinephrine 1-100,000 buffered w/ 8.4% NaHCO3 Instrument used: DermaBlade   Hemostasis achieved with: aluminum chloride   Outcome: patient tolerated procedure well   Post-procedure details: sterile dressing applied and wound care instructions given   Dressing type: bandage and petrolatum     Return in about 1  month (around 02/09/2024) for Wound Check.   Documentation: I have reviewed the above documentation for accuracy and completeness, and I agree with the above.  RUFUS CHRISTELLA HOLY, MD

## 2024-01-10 ENCOUNTER — Encounter: Payer: Self-pay | Admitting: Dermatology

## 2024-01-11 LAB — SURGICAL PATHOLOGY

## 2024-01-16 ENCOUNTER — Ambulatory Visit (INDEPENDENT_AMBULATORY_CARE_PROVIDER_SITE_OTHER): Payer: Medicare PPO | Admitting: Psychiatry

## 2024-01-16 DIAGNOSIS — Z91199 Patient's noncompliance with other medical treatment and regimen due to unspecified reason: Secondary | ICD-10-CM

## 2024-01-16 NOTE — Progress Notes (Signed)
 Andrea Simmons [id number] 09-23-1940 83 y.o.  Subjective:   Patient ID:  Andrea Simmons is a 83 y.o. (DOB 1940-06-12) female.  Chief Complaint:  No chief complaint on file.   Andrea Simmons presents  today for recent exacerbation of depression..    At visit was July 09, 2018.  Vraylar was discontinued for lack of response.  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  This was partially helpful.  When  seen Aug 15, 2018 and Ritalin  was increased to 15 mg twice daily because of partial response at the lower dose.  At visit and of July 2020.  She had a partial response to the Ritalin  for treatment resistant depression.  The Ritalin  was increased again to 20 mg twice daily.  seen December 04, 2018 & 02/2019.  Doesn't like winter.  Seasonal depression and crying more.  There were no med changes.   11/17/19 appt with the following noted: BP been higher and seeing Card and having med changes. Stopped Vit D bc level was high. Some discussion about the Ritalin  over the BP control. Some increase depression in part over health issues.  No marital problems.  No SI.  Issues with daughter are a stressor.  D said she was mean when D was growing up and was too strict.  Felt D was ungrateful and that she and H did the best they could do.  I can't get over that and has told D about this. Ritalin  with less energizing benefit and less productive than she was..  Wears off around supper time.  At night cannot break habit of snacks at night. Does not feel it kick in and wear off.  Back to old habits of staying up too late, now MN.  Needs 7-8 hours.  Always needed more than others.    I feel good taking it.  Most days feels pretty good.  Can have crying spells without reason even before the Ritalin .   Doing much more than I was.   H still sick often too. Plan: Reduce Ritalin  to 10 mg twice daily due to  loss of benefit and blood pressure Start change from duloxetine  to Trintellix  to help depression: Reduce duloxetine  to 3 of 30 mg capsules and start Trintellix  5 mg daily for 1 week, Then reduce duloxetine  to 2 of the 30 mg capsules and increase Trintellix  to 10 mg daily for 1 week, Then increase Trintellix  to 10 + 5 mg tablets and reduce duloxetine  to 1 of the 30 mg capsules for 1 week, Then stop Duloxetine  and increase Trintellix  to 20 mg daily (or 2 of the 10 mg tablets)  01/14/2020 appointment with the following noted: Is on Trintellix  20 mg daily since 12/08/19 approximately. Was really hard with the switch.  Got really low and now some better.  Had a lot of aches and fatigue for awhile for 5 weeks.  Wonders if she was sick.  She doesn't feel like Trintellix  is the right med. Gained 14# and still on low dose Ritalin .  Still having crying spells and poor energy but some days feels pretty good. No nausea or other SE noticed. A lot of worry over her health and BP and H's cellulitis again. Plan: Overall patient has not improved with switch from duloxetine  to Trintellix .  Her energy is not better and her mood is not better.  She is  also quite anxious. Reduce Trintellix  to 10 mg 1 daily and start viibryd  10 mg daily for a week, then  Stop Trintellix  and increase Viibryd  to 20 mg daily with food.  tC 01/23/20 wanting to stop Ritalin  so she did.  01/30/20 TC : LM:  Suggested she restart vitamin D  2000 units daily.  Her vitamin D  level is a little low and we would like to see it in the 50s.  It is hoped that dose will push back into the 50s and if there is a question we will recheck it in 3 months. She has not been on Viibryd  20 mg long enough to see the full benefit of that.  She needs to continue Viibryd  20 mg for at least a full 4 weeks at that dosage so another 3 weeks or so.  At that point we could consider increasing it if needed.   03/10/20 appt with following noted: Ritalin  didn't help and  might have increased BP per card. On Viibryd  about 6 weeks at 20 mg. Read on viibryd   And concerned about taking with coumadin . Gotten where she can't sleep well.  Last week only averaging 3 hours per night.  Also irritable a lot in last 10 days. Gained 17#. Hungry. Worries about things more than in the past and some of it is age. [place] and Monday diarrhea.  Otherwise just at times. Plan: DC Ritalin  per her request. Poor response so far with Viibryd . Increase Viibryd  to 30 mg for 1 week, then 40 mg daily.  04/28/2020 appt with following noted: A lot better but far from where I need to be. Still depressed. Can't sleep.  Catnap.  Taking viibryd  in morning 40 mg for 5 weeks. Increase Viibryd  to 40 mg daily.   3-4 hours sleep and no naps.  Not drowsy daytime. Ongoing tiredness chronically. Feels more tense and irritable from not sleeping.  Plan: continue Viibryd  40  06/17/2020 appt with following noted: vIIBRYD  not helping. Trazodone  25 mg hallucinated bugs and screamed. H not doing well and that hasn't helped mood. Body aches and hurts all over. Asks about return to Cymbalta . Still gaining weight. Broken sleep with total of 6-8 hours Plan: CO poor effect with Viibryd  and duloxetine  did help FM more and pain. Wean Viibryd  and restart duloxetine  and increase to 90 mg daily (*took 120 mg before) Reduce Viibryd  to 20 mg daily and start 1 of the duloxetine  30 mg daily for 5 days, Then continue Viibryd  20 mg daily and increase duloxetine  to 2 of the 30 mg daily for 5 days, Then reduce Viibryd  to 10 mg daily and increase duloxetine  to 3 of the 30 mg daily for 5 days, Then stop Viibryd  and continue duloxetine  3 daily. Rec trial mirtazapine  7.5 mg HS.  08/12/2020 appointment with the following noted: I'm doing better with Cymbalta  90. much, much better Mirtazapine  didn't work for [employer] but is sleeping now.  Laid down at MN and to sleep 2 A and up 845. Arthritis is bad and dx gout.  Joints in  hands are bad.  Anxiety manageable. Depression got really bad before the switch in meds.  Pleased with result.  Seasonal depression starts in fall and until spring. Busy and worked with flowers. Pretty good now. Plan no med changes.  Continue duloxetine  90 mg daily and mirtazapine  for sleep  12/15/2020 appointment with the following noted: I'm doing really good.  Cymbalta  is a success.  She's not worried about winter.   [place]eves has not  been well with cellulitis.  She's been through health problems.  Handles stressors well.   CO still poor sleep even with mirtazapine .  Will have 2-3 nights ok and other nights EFA.  Wants to sleep 11-8.  Seems she can't get relaxed and will worry at night.  Some nights pain interferes. Plan: Better with switch back to duloxetine  90 mg daily (*took 120 mg before) Okay trial of Lunesta  2 mg nightly with mirtazapine  7.5 mg nightly for chronic insomnia.  03/16/2021 appointment with the following noted: Wonderful cp to last year was depressed.  Pleased with duloxetine . May go 2-3 nights with poor sleep per week. Hangover if use Lunesta  2 with mirtazapine  7.5.  So stopped mirtazapine .  Lunesta  works sometime but no SE with it. D positive for Covid and didn't visit for Xmas. Plan: DC mirtazapine  7.5 mg HS DT poor response and hangover Belsomra samples given  09/14/21 appt noted: Belsomra NR and NM and none now. Can be startled when she awakens at times and did with Belsomra. Trouble getting to sleep then awakens with pain. Some nights almost no sleep Energy better with change in BP meds. Couple of falls since here. No depression despite health issues. Plan: Continue duloxetine  90 mg daily and Lunesta  2 mg nightly as needed insomnia  03/30/22 appt noted: Unfortunately H Chester fell and broke back and hasn't been able to come home yet.  Been at 3 different places.  He's in good spirits.  At [facility] living. He's 83 yo.  Was active until  he fell at home.  He can't walk and is incontinet.  His mind is clear. She's surprised at how well she has held up with the stress.  No sig seasonal depression so far.   Never been a person that sleeps well at night. Stress.    01/17/23 appt noted: Psych meds: duloxetine  90, Lunesta  2 mg HS Balance px intermittent with tendency to go to R side.   H chester still dealing with back px.  In 4 facilites since fall a year ago.  Into skilled nursing care now.  Can't walk and incontinent.  In hospital with rare infection.  So upset over [place] 83 yo and health problems. FM and chronic insomnia wearing her out.   When out of Lunesta  sleep is even worse, but still some nights no sleep.  Chronic leg cramps.  Chronic insomnia.   D and GS don't live near.  No one to help her.   FM pain is almost constant now.  Used to be episodic.   Don't want to stop duloxetine .  Some $ stress .    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   Past Psychiatric Medication Trials: Rozerem,  Trazodone  severe SE, mirtazapine  7.5 hangover,  Lunesta  2 , zolpidem Belsomra NM  Duloxetine  120, Paxil 20 for years, Sertraline, fluoxetine, Wellbutrin,   Trintellix  20 5 weeks NR.  Viibryd  40  NR,  Low dose nortriptyline, Hx gabapentin  300 and had amnesia  Abilify  weight gain and loss benefit, Was More talkative on the Abilify  and the Wellbutrin.  lithium 2004 SE tremor at 600mg  daily.   Vraylar 40 NR, Buspar  NR.  Review of Systems:  Review of Systems  Constitutional:  Positive for fatigue. Negative for appetite change and unexpected weight change.  HENT:  Negative for sinus pain.   Cardiovascular:  Negative for palpitations.  Gastrointestinal:  Negative for diarrhea.  Musculoskeletal:  Positive for arthralgias, back pain, joint swelling and myalgias. Negative for neck stiffness.  Neurological:  Negative for tremors and  numbness.  Psychiatric/Behavioral:  Positive for dysphoric mood and sleep disturbance. Negative for agitation, behavioral problems, confusion, decreased concentration, hallucinations, self-injury and suicidal ideas. The patient is nervous/anxious. The patient is not hyperactive.    Hx anemia.    Cardiologist said she had a stiff heart.  History of pulmonary embolism and on Coumadin .  Medications: I have reviewed the patient's current medications.  Current Outpatient Medications  Medication Sig Dispense Refill   ACCU-CHEK GUIDE test strip CHECK BLOOD SUGARS DAILY 100 strip 12   Accu-Chek Softclix Lancets lancets USE AS INSTRUCTED TO TEST SUGARS TWICE A DAY 100 each 12   amLODipine  (NORVASC ) 10 MG tablet TAKE 1 TABLET BY MOUTH EVERY DAY 90 tablet 2   Biotin 1000 MCG tablet Take 1,000 mcg by mouth daily.     Blood Glucose Monitoring Suppl (ACCU-CHEK GUIDE) w/Device KIT 1 each by Does not apply route 2 (two) times daily. To test sugars. Dx. E11.9 1 kit 1   dapagliflozin  propanediol (FARXIGA ) 5 MG TABS tablet Take 1 tablet (5 mg total) by mouth daily. 30 tablet 3   DULoxetine  (CYMBALTA ) 30 MG capsule Take 3 capsules (90 mg total) by mouth daily. 270 capsule 3   fenofibrate (TRICOR) 48 MG tablet Take 48 mg by mouth daily.     FLAREX 0.1 % ophthalmic suspension Apply to eye.     furosemide  (LASIX ) 20 MG tablet TAKE 1 TABLET BY MOUTH EVERY DAY AS NEEDED 90 tablet 2   hydrALAZINE  (APRESOLINE ) 50 MG tablet Take 1 tablet (50 mg total) by mouth 2 (two) times daily. 180 tablet 1   labetalol  (NORMODYNE ) 200 MG tablet TAKE 1 TABLET BY MOUTH TWICE A DAY 180 tablet 2   levothyroxine  (SYNTHROID ) 25 MCG tablet TAKE 1 TABLET BY MOUTH EVERY DAY BEFORE BREAKFAST 90 tablet 3   Magnesium  500 MG TABS Take 500 mg by mouth daily.     mirtazapine  (REMERON ) 7.5 MG tablet TAKE 1 TABLET BY MOUTH AT BEDTIME. 90 tablet 2   mupirocin  ointment (BACTROBAN ) 2 % Apply 1 Application topically 2 (two) times daily. 22 g 3    rosuvastatin  (CRESTOR ) 10 MG tablet TAKE 1 TABLET BY MOUTH EVERY DAY 90 tablet 1   triamcinolone  cream (KENALOG ) 0.1 % Apply 1 Application topically daily as needed. To heel BID as needed 45 g 1   triamcinolone  ointment (KENALOG ) 0.1 % Apply 1 Application topically 2 (two) times daily. To affected areas (Back) 60 g 3   valsartan  (DIOVAN ) 320 MG tablet TAKE 1 TABLET BY MOUTH EVERY DAY 90 tablet 2   warfarin (COUMADIN ) 5 MG tablet TAKE 1 (5 MG) TABLET DAILY BY MOUTH EXCEPT TAKE 1/2 TABLET (2.5 MG) ON FRIDAY, SATURDAY, AND SUNDAYS OR AS DIRECTED BY ANTICOAGULATION CLINIC.     No current facility-administered medications for this visit.    Medication Side Effects: None talkative more the [facility]  Allergies:  Allergies  Allergen Reactions   Lipitor [Atorvastatin]     Unknown   Morphine  Nausea And Vomiting   Meperidine Nausea And Vomiting   Naproxen Sodium Nausea And Vomiting    Past Medical History:  Diagnosis Date   Abnormal gait 12/10/2020   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) 05/15/2022   Anxiety    Breast cancer (HCC)    Chest pain of uncertain etiology 12/25/2018  Chronic osteoarthritis    CKD stage 3b, GFR 30-44 ml/min (HCC) 01/25/2021   Dehydration 09/16/2020   Depression    Diabetes mellitus type II    Diabetes type 2, controlled (HCC) 08/27/2008   Qualifier: Diagnosis of   By: Norleen MD, Lynwood ORN        Elevated lipase 09/16/2020   Essential hypertension 03/20/2007   Qualifier: Diagnosis of   By: Joshua RN, CGRN, Sheri         Exertional dyspnea 11/09/2008   11/20/2017  Walked RA x 3 laps @ 185 ft each stopped due to  End of study, fast pace, no desat, min sob   - Echo 12/05/2017 - LVEF 60-65%, moderate LVH, normal wall motion, grade 1 DD,   indeterminate LV filling pressure, normal GLS at -21%, normal LA   size, trivial TR, RVSP 20 mmHg, normal IVC.           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 Corey 12/12/2023   Squamous cell carcinoma of skin 10/16/2023   Left Anterior Neck- tx Mohs with Dr. Corey 11/14/2023   Squamous cell carcinoma of skin 10/16/2023   right lower leg-anterior Mohs Appt with Dr. Corey 11/28/2023    Family History  Problem Relation Age of Onset   Prostate cancer Maternal Uncle    Hypertension Maternal Grandfather    Breast cancer Cousin    Breast cancer Cousin 41   Depression Other        Parent   Arthritis Other        Parent, Grandparent   Hypertension Other        Grandparent   Hyperlipidemia Other        FMH   Miscarriages / Stillbirths Other        Grandparent   Stroke Other        [facility]    Social History   Socioeconomic History   Marital status: Married    Spouse name: Not on file   Number of children: 1   Years of education: Not on file   Highest education level: Not on file  Occupational History   Occupation: Software Engineer: RETIRED   Occupation: producer, television/film/video   Occupation: school bus driver  Tobacco Use   Smoking status: Never    Passive exposure: Never   Smokeless tobacco: Never  Vaping Use   Vaping status: Never Used  Substance and Sexual Activity   Alcohol  use: No    Alcohol /week: 0.0 standard drinks of alcohol    Drug use: No   Sexual activity: Not Currently  Other Topics Concern   Not on file  Social History Narrative   Not on file   Social Drivers of Health   Financial Resource Strain: Low Risk  (07/02/2023)   Overall Financial Resource Strain (CARDIA)    Difficulty of Paying Living Expenses: Not hard at all  Food Insecurity: No Food Insecurity (07/02/2023)   Hunger  Vital Sign    Worried About Running Out of Food in the Last Year:  Never true    Ran Out of Food in the Last Year: Never true  Transportation Needs: No Transportation Needs (07/02/2023)   PRAPARE - Administrator, Civil Service (Medical): No    Lack of Transportation (Non-Medical): No  Physical Activity: Inactive (07/02/2023)   Exercise Vital Sign    Days of Exercise per Week: 0 days    Minutes of Exercise per Session: 0 min  Stress: No Stress Concern Present (07/02/2023)   [employer] of Occupational Health - Occupational Stress Questionnaire    Feeling of Stress : Not at all  Social Connections: Moderately Isolated (07/02/2023)   Social Connection and Isolation Panel    Frequency of Communication with Friends and Family: More than three times a week    Frequency of Social Gatherings with Friends and Family: Twice a week    Attends Religious Services: Never    Database Administrator or Organizations: No    Attends Banker Meetings: Never    Marital Status: Married  Catering Manager Violence: Not At Risk (07/02/2023)   Humiliation, Afraid, Rape, and Kick questionnaire    Fear of Current or Ex-Partner: No    Emotionally Abused: No    Physically Abused: No    Sexually Abused: No    Past Medical History, Surgical history, Social history, and Family history were reviewed and updated as appropriate.   Please see review of systems for further details on the patient's review from today.   Objective:   Physical Exam:  There were no vitals taken for this visit.  Physical Exam Constitutional:      General: She is not in acute distress.    Appearance: She is well-developed.  Musculoskeletal:        General: No deformity.  Neurological:     Mental Status: She is alert and oriented to person, place, and time.     Cranial Nerves: No dysarthria.     Coordination: Coordination normal.  Psychiatric:        Attention and Perception: Attention normal. She is  attentive.        Mood and Affect: Mood is anxious. Mood is not depressed. Affect is tearful. Affect is not labile, blunt, angry or inappropriate.        Speech: Speech normal. Speech is not rapid and pressured.        Behavior: Behavior normal. Behavior is cooperative.        Thought Content: Thought content normal. Thought content is not paranoid or delusional. Thought content does not include homicidal or suicidal ideation. Thought content does not include suicidal plan.        Cognition and Memory: Cognition and memory normal.        Judgment: Judgment normal.     Comments: Insight fair-good.   Depression is OK so far.  Pleasant.   Talkative chronically Tearful over H's health and being alone.      Lab Review:     Component Value Date/Time   NA 140 07/23/2023 1437   NA 141 08/01/2016 1527   K 3.8 07/23/2023 1437   K 4.6 08/01/2016 1527   CL 103 07/23/2023 1437   CO2 22 07/23/2023 1437   CO2 24 08/01/2016 1527   GLUCOSE 92 07/23/2023 1437   GLUCOSE 101 (H) 06/24/2023 2241   GLUCOSE 93 08/01/2016 1527   BUN 27 07/23/2023 1437   BUN 19.9 08/01/2016 1527   CREATININE 1.19 (H) 07/23/2023 1437   CREATININE 0.9 08/01/2016 1527  CALCIUM  9.4 07/23/2023 1437   CALCIUM  9.7 08/01/2016 1527   PROT 6.4 07/23/2023 1437   PROT 7.2 08/01/2016 1527   ALBUMIN 4.3 07/23/2023 1437   ALBUMIN 4.0 08/01/2016 1527   AST 25 07/23/2023 1437   AST 41 (H) 08/01/2016 1527   ALT 14 07/23/2023 1437   ALT 25 08/01/2016 1527   ALKPHOS 52 07/23/2023 1437   ALKPHOS 48 08/01/2016 1527   BILITOT 0.4 07/23/2023 1437   BILITOT 0.33 08/01/2016 1527   GFRNONAA 41 (L) 06/24/2023 2241   GFRAA 76 05/28/2019 1609       Component Value Date/Time   WBC 6.4 07/23/2023 1437   WBC 9.6 06/24/2023 2241   RBC 3.74 (L) 07/23/2023 1437   RBC 3.58 (L) 06/24/2023 2241   HGB 11.6 07/23/2023 1437   HGB 9.5 (L) 08/01/2016 1527   HCT 35.4 07/23/2023 1437   HCT 31.2 (L) 08/01/2016 1527   PLT 200 07/23/2023 1437    MCV 95 07/23/2023 1437   MCV 83.9 08/01/2016 1527   MCH 31.0 07/23/2023 1437   MCH 31.6 06/24/2023 2241   MCHC 32.8 07/23/2023 1437   MCHC 34.3 06/24/2023 2241   RDW 12.1 07/23/2023 1437   RDW 17.2 (H) 08/01/2016 1527   LYMPHSABS 1.2 07/23/2023 1437   LYMPHSABS 2.0 08/01/2016 1527   MONOABS 1.0 06/24/2023 2241   MONOABS 0.4 08/01/2016 1527   EOSABS 0.1 07/23/2023 1437   BASOSABS 0.0 07/23/2023 1437   BASOSABS 0.0 08/01/2016 1527    No results found for: POCLITH, LITHIUM   No results found for: PHENYTOIN, PHENOBARB, VALPROATE, CBMZ   Endo checked thyroid  lately.   Vitamin D  level on April 01, 2018 reported as 40.  After that vitamin D  50,000 units weekly was prescribed with a goal of achieving levels in the 50s and 60s.  December 04, 2018 vitamin D  25 off supplement .res Assessment: Plan:    There are no diagnoses linked to this encounter.   30 min face to face time with patient was spent on counseling and coordination of care. Hx.TRD better again with duloxetine  again.  We discussed options for dealing with this. If needed consider lamotrigine.  Pt hx mixed sx so consider unconventional alternatives.  Discussed potential benefits, risks, and side effects of stimulants with patient to include increased heart rate, palpitations, insomnia, increased anxiety, increased irritability, or decreased appetite.  Instructed patient to contact office if experiencing any significant tolerability issues. She does not appear manic and not sig different from the last visit. Still talkative on it but not manic.  Better with switch back to duloxetine  90 mg daily (*took 120 mg before)  For FM can't use gabapentin  300 DT hx SE.  Therefore reluctant to try Lyrica. But option to try gabapentin  100 mg HS.  She wants to try DT severity FM pain.  Educated that she cannot expect to sleep solid through the night.  Explained.   Also disc sleep hygiene.   She's frustrated with sleep.   Trial of Bz bc worry is part of it but risk SE and risk tolerance.  Disc fall risk with sleep meds.  Have tried the safest meds. Disc timing of Lunesta  but she insists on taking it early instead. Cont prn Lunesta  2 mg HS DT age.  Prefer empty stomach. She has chronic insomnia. Still not sleeping very well.  Supportive therapy dealing with duloxetine .  Repeat vitamin D  level was 47.  Previously high.  Restarted 2000 units daily.  FU 6 mos or longer per her request  Lorene Macintosh, MD, DFAPA   Please see After Visit Summary for patient specific instructions.  Future Appointments  Date Time Provider Department Center  01/21/2024  1:00 PM [facility]  01/23/2024  2:50 PM Knute Thersia Bitters, FNP [facility] [id number]  02/07/2024  2:45 PM Paci, Rufus HERO, MD [facility] None    No orders of the defined types were placed in this encounter.      -------------------------------

## 2024-01-19 ENCOUNTER — Other Ambulatory Visit: Payer: Self-pay | Admitting: Psychiatry

## 2024-01-19 DIAGNOSIS — F331 Major depressive disorder, recurrent, moderate: Secondary | ICD-10-CM

## 2024-01-19 DIAGNOSIS — F4001 Agoraphobia with panic disorder: Secondary | ICD-10-CM

## 2024-01-19 DIAGNOSIS — F338 Other recurrent depressive disorders: Secondary | ICD-10-CM

## 2024-01-19 DIAGNOSIS — F411 Generalized anxiety disorder: Secondary | ICD-10-CM

## 2024-01-21 ENCOUNTER — Encounter: Payer: Self-pay | Admitting: Radiology

## 2024-01-21 ENCOUNTER — Telehealth: Payer: Self-pay

## 2024-01-21 ENCOUNTER — Ambulatory Visit

## 2024-01-21 NOTE — Telephone Encounter (Signed)
 Spoke with pt gave her bx results and treatment recommendations

## 2024-01-23 ENCOUNTER — Ambulatory Visit (HOSPITAL_BASED_OUTPATIENT_CLINIC_OR_DEPARTMENT_OTHER): Admitting: Family Medicine

## 2024-01-25 ENCOUNTER — Ambulatory Visit: Payer: Self-pay | Admitting: Dermatology

## 2024-01-28 ENCOUNTER — Ambulatory Visit: Admitting: Psychiatry

## 2024-01-28 ENCOUNTER — Ambulatory Visit

## 2024-01-28 ENCOUNTER — Encounter: Payer: Self-pay | Admitting: Psychiatry

## 2024-01-28 DIAGNOSIS — F331 Major depressive disorder, recurrent, moderate: Secondary | ICD-10-CM

## 2024-01-28 DIAGNOSIS — F338 Other recurrent depressive disorders: Secondary | ICD-10-CM

## 2024-01-28 DIAGNOSIS — G4721 Circadian rhythm sleep disorder, delayed sleep phase type: Secondary | ICD-10-CM | POA: Diagnosis not present

## 2024-01-28 DIAGNOSIS — F411 Generalized anxiety disorder: Secondary | ICD-10-CM | POA: Diagnosis not present

## 2024-01-28 DIAGNOSIS — F4001 Agoraphobia with panic disorder: Secondary | ICD-10-CM

## 2024-01-28 DIAGNOSIS — F5105 Insomnia due to other mental disorder: Secondary | ICD-10-CM

## 2024-01-28 DIAGNOSIS — M797 Fibromyalgia: Secondary | ICD-10-CM | POA: Diagnosis not present

## 2024-01-28 MED ORDER — QUETIAPINE FUMARATE 25 MG PO TABS: 25.0000 mg | ORAL_TABLET | Freq: Every day | ORAL | 1 refills | Status: DC

## 2024-01-28 MED ORDER — DULOXETINE HCL 30 MG PO CPEP: 90.0000 mg | ORAL_CAPSULE | Freq: Every day | ORAL | 0 refills | Status: AC

## 2024-01-28 NOTE — Progress Notes (Signed)
 TERISA BELARDO [id number] Aug 12, 1940 83 y.o.  Subjective:   Patient ID:  Andrea Simmons is a 83 y.o. (DOB 18-Feb-1941) female.  Chief Complaint:  Chief Complaint  Patient presents with   Follow-up   Depression   Sleeping Problem    NIKIAH GOIN presents  today for recent exacerbation of depression..    At visit was July 09, 2018.  Vraylar was discontinued for lack of response.  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  This was partially helpful.  When  seen Aug 15, 2018 and Ritalin  was increased to 15 mg twice daily because of partial response at the lower dose.  At visit and of July 2020.  She had a partial response to the Ritalin  for treatment resistant depression.  The Ritalin  was increased again to 20 mg twice daily.  seen December 04, 2018 & 02/2019.  Doesn't like winter.  Seasonal depression and crying more.  There were no med changes.   11/17/19 appt with the following noted: BP been higher and seeing Card and having med changes. Stopped Vit D bc level was high. Some discussion about the Ritalin  over the BP control. Some increase depression in part over health issues.  No marital problems.  No SI.  Issues with daughter are a stressor.  D said she was mean when D was growing up and was too strict.  Felt D was ungrateful and that she and H did the best they could do.  I can't get over that and has told D about this. Ritalin  with less energizing benefit and less productive than she was..  Wears off around supper time.  At night cannot break habit of snacks at night. Does not feel it kick in and wear off.  Back to old habits of staying up too late, now MN.  Needs 7-8 hours.  Always needed more than others.    I feel good taking it.  Most days feels pretty good.  Can have crying spells without reason even before the Ritalin .   Doing much more than I was.   H still sick  often too. Plan: Reduce Ritalin  to 10 mg twice daily due to loss of benefit and blood pressure Start change from duloxetine  to Trintellix  to help depression: Reduce duloxetine  to 3 of 30 mg capsules and start Trintellix  5 mg daily for 1 week, Then reduce duloxetine  to 2 of the 30 mg capsules and increase Trintellix  to 10 mg daily for 1 week, Then increase Trintellix  to 10 + 5 mg tablets and reduce duloxetine  to 1 of the 30 mg capsules for 1 week, Then stop Duloxetine  and increase Trintellix  to 20 mg daily (or 2 of the 10 mg tablets)  01/14/2020 appointment with the following noted: Is on Trintellix  20 mg daily since 12/08/19 approximately. Was really hard with the switch.  Got really low and now some better.  Had a lot of aches and fatigue for awhile for 5 weeks.  Wonders if she was sick.  She doesn't feel like Trintellix  is the right med. Gained 14# and still on low dose Ritalin .  Still having crying spells and poor energy but some days feels pretty good. No nausea or other SE noticed. A lot of worry over her health and BP and H's cellulitis again. Plan: Overall patient has not improved with switch from duloxetine  to Trintellix .  Her energy  is not better and her mood is not better.  She is also quite anxious. Reduce Trintellix  to 10 mg 1 daily and start viibryd  10 mg daily for a week, then  Stop Trintellix  and increase Viibryd  to 20 mg daily with food.  tC 01/23/20 wanting to stop Ritalin  so she did.  01/30/20 TC : LM:  Suggested she restart vitamin D  2000 units daily.  Her vitamin D  level is a little low and we would like to see it in the 50s.  It is hoped that dose will push back into the 50s and if there is a question we will recheck it in 3 months. She has not been on Viibryd  20 mg long enough to see the full benefit of that.  She needs to continue Viibryd  20 mg for at least a full 4 weeks at that dosage so another 3 weeks or so.  At that point we could consider increasing it if needed.    03/10/20 appt with following noted: Ritalin  didn't help and might have increased BP per card. On Viibryd  about 6 weeks at 20 mg. Read on viibryd   And concerned about taking with coumadin . Gotten where she can't sleep well.  Last week only averaging 3 hours per night.  Also irritable a lot in last 10 days. Gained 17#. Hungry. Worries about things more than in the past and some of it is age. [place] and Monday diarrhea.  Otherwise just at times. Plan: DC Ritalin  per her request. Poor response so far with Viibryd . Increase Viibryd  to 30 mg for 1 week, then 40 mg daily.  04/28/2020 appt with following noted: A lot better but far from where I need to be. Still depressed. Can't sleep.  Catnap.  Taking viibryd  in morning 40 mg for 5 weeks. Increase Viibryd  to 40 mg daily.   3-4 hours sleep and no naps.  Not drowsy daytime. Ongoing tiredness chronically. Feels more tense and irritable from not sleeping.  Plan: continue Viibryd  40  06/17/2020 appt with following noted: vIIBRYD  not helping. Trazodone  25 mg hallucinated bugs and screamed. H not doing well and that hasn't helped mood. Body aches and hurts all over. Asks about return to Cymbalta . Still gaining weight. Broken sleep with total of 6-8 hours Plan: CO poor effect with Viibryd  and duloxetine  did help FM more and pain. Wean Viibryd  and restart duloxetine  and increase to 90 mg daily (*took 120 mg before) Reduce Viibryd  to 20 mg daily and start 1 of the duloxetine  30 mg daily for 5 days, Then continue Viibryd  20 mg daily and increase duloxetine  to 2 of the 30 mg daily for 5 days, Then reduce Viibryd  to 10 mg daily and increase duloxetine  to 3 of the 30 mg daily for 5 days, Then stop Viibryd  and continue duloxetine  3 daily. Rec trial mirtazapine  7.5 mg HS.  08/12/2020 appointment with the following noted: I'm doing better with Cymbalta  90. much, much better Mirtazapine  didn't work for [employer] but is sleeping now.  Laid down at MN and to  sleep 2 A and up 845. Arthritis is bad and dx gout.  Joints in hands are bad.  Anxiety manageable. Depression got really bad before the switch in meds.  Pleased with result.  Seasonal depression starts in fall and until spring. Busy and worked with flowers. Pretty good now. Plan no med changes.  Continue duloxetine  90 mg daily and mirtazapine  for sleep  12/15/2020 appointment with the following noted: I'm doing really good.  Cymbalta  is a  success.  She's not worried about winter.   [place]eves has not been well with cellulitis.  She's been through health problems.  Handles stressors well.   CO still poor sleep even with mirtazapine .  Will have 2-3 nights ok and other nights EFA.  Wants to sleep 11-8.  Seems she can't get relaxed and will worry at night.  Some nights pain interferes. Plan: Better with switch back to duloxetine  90 mg daily (*took 120 mg before) Okay trial of Lunesta  2 mg nightly with mirtazapine  7.5 mg nightly for chronic insomnia.  03/16/2021 appointment with the following noted: Wonderful cp to last year was depressed.  Pleased with duloxetine . May go 2-3 nights with poor sleep per week. Hangover if use Lunesta  2 with mirtazapine  7.5.  So stopped mirtazapine .  Lunesta  works sometime but no SE with it. D positive for Covid and didn't visit for Xmas. Plan: DC mirtazapine  7.5 mg HS DT poor response and hangover Belsomra samples given  09/14/21 appt noted: Belsomra NR and NM and none now. Can be startled when she awakens at times and did with Belsomra. Trouble getting to sleep then awakens with pain. Some nights almost no sleep Energy better with change in BP meds. Couple of falls since here. No depression despite health issues. Plan: Continue duloxetine  90 mg daily and Lunesta  2 mg nightly as needed insomnia  03/30/22 appt noted: Unfortunately H Chester fell and broke back and hasn't been able to come home yet.  Been at 3 different places.  He's in good spirits.  At [facility]. He's 83 yo.  Was active until he fell at home.  He can't walk and is incontinet.  His mind is clear. She's surprised at how well she has held up with the stress.  No sig seasonal depression so far.   Never been a person that sleeps well at night. Stress.    01/17/23 appt noted: Psych meds: duloxetine  90, Lunesta  2 mg HS Balance px intermittent with tendency to go to R side.   H chester still dealing with back px.  In 4 facilites since fall a year ago.  Into skilled nursing care now.  Can't walk and incontinent.  In hospital with rare infection.  So upset over [place] 83 yo and health problems. FM and chronic insomnia wearing her out.   When out of Lunesta  sleep is even worse, but still some nights no sleep.  Chronic leg cramps.  Chronic insomnia.   D and GS don't live near.  No one to help her.   FM pain is almost constant now.  Used to be episodic.   Don't want to stop duloxetine .  Some $ stress .    01/16/24 appt noted:  Psych meds: duloxetine  90, Lunesta  2 mg HS NO SHOW  01/28/24 appt noted:  Psych meds: duloxetine  90, NO Lunesta  2 mg HS, PCP rx mirtazapine  7.5 mg HS Sleep still a problem and mirtazapine  didn't help.  Trouble goin gto sleep and staying asleep.  Worse FM with stress.  Some nights no sleep.  But avg 3-4 hours nightly.  Wearing me out. Hip pain comes and goes.   No SE.  Including none with mirtazapine .  Stress $ a little better with H at [facility] but not gone.  Would like to be living with H but can't.    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   Past Psychiatric Medication Trials: Rozerem,  Trazodone  severe SE, mirtazapine  7.5  hangover,  Lunesta  2 , zolpidem Belsomra NM  Duloxetine  120, Paxil 20 for years, Sertraline, fluoxetine, Wellbutrin,   Trintellix  20 5 weeks NR.  Viibryd  40  NR,  Low dose nortriptyline, Hx gabapentin  300 and had amnesia  Abilify  weight gain and loss benefit, Was More talkative on the Abilify   and the Wellbutrin.  lithium 2004 SE tremor at 600mg  daily.   Vraylar 40 NR, Buspar  NR.  Review of Systems:  Review of Systems  Constitutional:  Positive for fatigue. Negative for appetite change and unexpected weight change.  HENT:  Negative for sinus pain.   Cardiovascular:  Negative for palpitations.  Gastrointestinal:  Negative for diarrhea.  Musculoskeletal:  Positive for arthralgias, back pain, joint swelling and myalgias. Negative for neck stiffness.  Neurological:  Negative for tremors, weakness and numbness.  Psychiatric/Behavioral:  Positive for dysphoric mood and sleep disturbance. Negative for agitation, behavioral problems, confusion, decreased concentration, hallucinations, self-injury and suicidal ideas. The patient is nervous/anxious. The patient is not hyperactive.    Hx anemia.    Cardiologist said she had a stiff heart.  History of pulmonary embolism and on Coumadin .  Medications: I have reviewed the patient's current medications.  Current Outpatient Medications  Medication Sig Dispense Refill   ACCU-CHEK GUIDE test strip CHECK BLOOD SUGARS DAILY 100 strip 12   Accu-Chek Softclix Lancets lancets USE AS INSTRUCTED TO TEST SUGARS TWICE A DAY 100 each 12   amLODipine  (NORVASC ) 10 MG tablet TAKE 1 TABLET BY MOUTH EVERY DAY 90 tablet 2   Biotin 1000 MCG tablet Take 1,000 mcg by mouth daily.     Blood Glucose Monitoring Suppl (ACCU-CHEK GUIDE) w/Device KIT 1 each by Does not apply route 2 (two) times daily. To test sugars. Dx. E11.9 1 kit 1   dapagliflozin  propanediol (FARXIGA ) 5 MG TABS tablet Take 1 tablet (5 mg total) by mouth daily. 30 tablet 3   fenofibrate (TRICOR) 48 MG tablet Take 48 mg by mouth daily.     FLAREX 0.1 % ophthalmic suspension Apply to eye.     furosemide  (LASIX ) 20 MG tablet TAKE 1 TABLET BY MOUTH EVERY DAY AS NEEDED 90 tablet 2   labetalol  (NORMODYNE ) 200 MG tablet TAKE 1 TABLET BY MOUTH TWICE A DAY 180 tablet 2   levothyroxine  (SYNTHROID ) 25 MCG  tablet TAKE 1 TABLET BY MOUTH EVERY DAY BEFORE BREAKFAST 90 tablet 3   Magnesium  500 MG TABS Take 500 mg by mouth daily.     mupirocin  ointment (BACTROBAN ) 2 % Apply 1 Application topically 2 (two) times daily. 22 g 3   QUEtiapine (SEROQUEL) 25 MG tablet Take 1 tablet (25 mg total) by mouth at bedtime. 30 tablet 1   rosuvastatin  (CRESTOR ) 10 MG tablet TAKE 1 TABLET BY MOUTH EVERY DAY 90 tablet 1   triamcinolone  cream (KENALOG ) 0.1 % Apply 1 Application topically daily as needed. To heel BID as needed 45 g 1   triamcinolone  ointment (KENALOG ) 0.1 % Apply 1 Application topically 2 (two) times daily. To affected areas (Back) 60 g 3   valsartan  (DIOVAN ) 320 MG tablet TAKE 1 TABLET BY MOUTH EVERY DAY 90 tablet 2   warfarin (COUMADIN ) 5 MG tablet TAKE 1 (5 MG) TABLET DAILY BY MOUTH EXCEPT TAKE 1/2 TABLET (2.5 MG) ON FRIDAY, SATURDAY, AND SUNDAYS OR AS DIRECTED BY ANTICOAGULATION CLINIC.     DULoxetine  (CYMBALTA ) 30 MG capsule Take 3 capsules (90 mg total) by mouth daily. 270 capsule 0   hydrALAZINE  (APRESOLINE ) 50 MG tablet Take 1  tablet (50 mg total) by mouth 2 (two) times daily. 180 tablet 1   No current facility-administered medications for this visit.    Medication Side Effects: None talkative more the [facility]  Allergies:  Allergies  Allergen Reactions   Lipitor [Atorvastatin]     Unknown   Morphine  Nausea And Vomiting   Meperidine Nausea And Vomiting   Naproxen Sodium Nausea And Vomiting    Past Medical History:  Diagnosis Date   Abnormal gait 12/10/2020   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) 05/15/2022   Anxiety    Breast cancer (HCC)    Chest pain of uncertain etiology 12/25/2018   Chronic osteoarthritis    CKD stage 3b, GFR 30-44 ml/min (HCC) 01/25/2021   Dehydration 09/16/2020   Depression    Diabetes mellitus type II    Diabetes type 2, controlled (HCC) 08/27/2008   Qualifier: Diagnosis of   By: Norleen MD, Lynwood ORN        Elevated lipase 09/16/2020    Essential hypertension 03/20/2007   Qualifier: Diagnosis of   By: Joshua RN, CGRN, Sheri         Exertional dyspnea 11/09/2008   11/20/2017  Walked RA x 3 laps @ 185 ft each stopped due to  End of study, fast pace, no desat, min sob   - Echo 12/05/2017 - LVEF 60-65%, moderate LVH, normal wall motion, grade 1 DD,   indeterminate LV filling pressure, normal GLS at -21%, normal LA   size, trivial TR, RVSP 20 mmHg, normal IVC.           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 Corey 12/12/2023   Squamous cell carcinoma of skin 10/16/2023   Left Anterior Neck- tx Mohs with Dr. Corey 11/14/2023   Squamous cell carcinoma of skin 10/16/2023   right lower leg-anterior Mohs Appt with Dr. Corey 11/28/2023    Family History  Problem Relation Age of Onset   Prostate cancer Maternal Uncle    Hypertension Maternal Grandfather    Breast cancer Cousin    Breast cancer Cousin 54   Depression Other        Parent   Arthritis Other        Parent, Grandparent   Hypertension Other        Grandparent   Hyperlipidemia Other        FMH   Miscarriages / Stillbirths Other        Grandparent   Stroke Other        [facility]    Social History   Socioeconomic History   Marital status: Married    Spouse name: Not on file   Number of children: 1   Years of education: Not on file   Highest education level: Not on file  Occupational History   Occupation: Software Engineer: RETIRED   Occupation: producer, television/film/video   Occupation: school bus driver  Tobacco Use   Smoking status: Never    Passive exposure: Never    Smokeless tobacco: Never  Vaping  Use   Vaping status: Never Used  Substance and Sexual Activity   Alcohol  use: No    Alcohol /week: 0.0 standard drinks of alcohol    Drug use: No   Sexual activity: Not Currently  Other Topics Concern   Not on file  Social History Narrative   Not on file   Social Drivers of Health   Financial Resource Strain: Low Risk  (07/02/2023)   Overall Financial Resource Strain (CARDIA)    Difficulty of Paying Living Expenses: Not hard at all  Food Insecurity: No Food Insecurity (07/02/2023)   Hunger Vital Sign    Worried About Running Out of Food in the Last Year: Never true    Ran Out of Food in the Last Year: Never true  Transportation Needs: No Transportation Needs (07/02/2023)   PRAPARE - Administrator, Civil Service (Medical): No    Lack of Transportation (Non-Medical): No  Physical Activity: Inactive (07/02/2023)   Exercise Vital Sign    Days of Exercise per Week: 0 days    Minutes of Exercise per Session: 0 min  Stress: No Stress Concern Present (07/02/2023)   [employer] of Occupational Health - Occupational Stress Questionnaire    Feeling of Stress : Not at all  Social Connections: Moderately Isolated (07/02/2023)   Social Connection and Isolation Panel    Frequency of Communication with Friends and Family: More than three times a week    Frequency of Social Gatherings with Friends and Family: Twice a week    Attends Religious Services: Never    Database Administrator or Organizations: No    Attends Banker Meetings: Never    Marital Status: Married  Catering Manager Violence: Not At Risk (07/02/2023)   Humiliation, Afraid, Rape, and Kick questionnaire    Fear of Current or Ex-Partner: No    Emotionally Abused: No    Physically Abused: No    Sexually Abused: No    Past Medical History, Surgical history, Social history, and Family history were reviewed and updated as appropriate.   Please see review of systems for  further details on the patient's review from today.   Objective:   Physical Exam:  There were no vitals taken for this visit.  Physical Exam Constitutional:      General: She is not in acute distress.    Appearance: She is well-developed.  Musculoskeletal:        General: No deformity.  Neurological:     Mental Status: She is alert and oriented to person, place, and time.     Cranial Nerves: No dysarthria.     Coordination: Coordination normal.  Psychiatric:        Attention and Perception: Attention normal. She is attentive.        Mood and Affect: Mood is anxious. Mood is not depressed. Affect is not labile, blunt, angry, tearful or inappropriate.        Speech: Speech normal. Speech is not rapid and pressured.        Behavior: Behavior normal. Behavior is cooperative.        Thought Content: Thought content normal. Thought content is not paranoid or delusional. Thought content does not include homicidal or suicidal ideation. Thought content does not include suicidal plan.        Cognition and Memory: Cognition and memory normal.        Judgment: Judgment normal.     Comments: Insight fair-good.   Depression is OK so far.  Pleasant.  Talkative chronically      Lab Review:     Component Value Date/Time   NA 140 07/23/2023 1437   NA 141 08/01/2016 1527   K 3.8 07/23/2023 1437   K 4.6 08/01/2016 1527   CL 103 07/23/2023 1437   CO2 22 07/23/2023 1437   CO2 24 08/01/2016 1527   GLUCOSE 92 07/23/2023 1437   GLUCOSE 101 (H) 06/24/2023 2241   GLUCOSE 93 08/01/2016 1527   BUN 27 07/23/2023 1437   BUN 19.9 08/01/2016 1527   CREATININE 1.19 (H) 07/23/2023 1437   CREATININE 0.9 08/01/2016 1527   CALCIUM  9.4 07/23/2023 1437   CALCIUM  9.7 08/01/2016 1527   PROT 6.4 07/23/2023 1437   PROT 7.2 08/01/2016 1527   ALBUMIN 4.3 07/23/2023 1437   ALBUMIN 4.0 08/01/2016 1527   AST 25 07/23/2023 1437   AST 41 (H) 08/01/2016 1527   ALT 14 07/23/2023 1437   ALT 25 08/01/2016 1527    ALKPHOS 52 07/23/2023 1437   ALKPHOS 48 08/01/2016 1527   BILITOT 0.4 07/23/2023 1437   BILITOT 0.33 08/01/2016 1527   GFRNONAA 41 (L) 06/24/2023 2241   GFRAA 76 05/28/2019 1609       Component Value Date/Time   WBC 6.4 07/23/2023 1437   WBC 9.6 06/24/2023 2241   RBC 3.74 (L) 07/23/2023 1437   RBC 3.58 (L) 06/24/2023 2241   HGB 11.6 07/23/2023 1437   HGB 9.5 (L) 08/01/2016 1527   HCT 35.4 07/23/2023 1437   HCT 31.2 (L) 08/01/2016 1527   PLT 200 07/23/2023 1437   MCV 95 07/23/2023 1437   MCV 83.9 08/01/2016 1527   MCH 31.0 07/23/2023 1437   MCH 31.6 06/24/2023 2241   MCHC 32.8 07/23/2023 1437   MCHC 34.3 06/24/2023 2241   RDW 12.1 07/23/2023 1437   RDW 17.2 (H) 08/01/2016 1527   LYMPHSABS 1.2 07/23/2023 1437   LYMPHSABS 2.0 08/01/2016 1527   MONOABS 1.0 06/24/2023 2241   MONOABS 0.4 08/01/2016 1527   EOSABS 0.1 07/23/2023 1437   BASOSABS 0.0 07/23/2023 1437   BASOSABS 0.0 08/01/2016 1527    No results found for: POCLITH, LITHIUM   No results found for: PHENYTOIN, PHENOBARB, VALPROATE, CBMZ   Endo checked thyroid  lately.   Vitamin D  level on April 01, 2018 reported as 40.  After that vitamin D  50,000 units weekly was prescribed with a goal of achieving levels in the 50s and 60s.  December 04, 2018 vitamin D  25 off supplement .res Assessment: Plan:    Nakeda was seen today for follow-up, depression and sleeping problem.  Diagnoses and all orders for this visit:  Insomnia due to mental condition -     QUEtiapine (SEROQUEL) 25 MG tablet; Take 1 tablet (25 mg total) by mouth at bedtime.  Major depressive disorder, recurrent episode, moderate (HCC) -     QUEtiapine (SEROQUEL) 25 MG tablet; Take 1 tablet (25 mg total) by mouth at bedtime. -     DULoxetine  (CYMBALTA ) 30 MG capsule; Take 3 capsules (90 mg total) by mouth daily.  Panic disorder with agoraphobia -     DULoxetine  (CYMBALTA ) 30 MG capsule; Take 3 capsules (90 mg total) by mouth  daily.  Generalized anxiety disorder -     DULoxetine  (CYMBALTA ) 30 MG capsule; Take 3 capsules (90 mg total) by mouth daily.  Seasonal depression -     DULoxetine  (CYMBALTA ) 30 MG capsule; Take 3 capsules (90 mg total) by mouth daily.  Delayed sleep phase syndrome -  QUEtiapine (SEROQUEL) 25 MG tablet; Take 1 tablet (25 mg total) by mouth at bedtime.  Fibromyalgia     30 min face to face time with patient was spent on counseling and coordination of care. Hx.TRD better again with duloxetine  again.  We discussed options for dealing with this. If needed consider lamotrigine.  Pt hx mixed sx so consider unconventional alternatives.  Discussed potential benefits, risks, and side effects of stimulants with patient to include increased heart rate, palpitations, insomnia, increased anxiety, increased irritability, or decreased appetite.  Instructed patient to contact office if experiencing any significant tolerability issues. She does not appear manic and not sig different from the last visit. Still talkative on it but not manic.  Better with switch back to duloxetine  90 mg daily (*took 120 mg before) Consisder increase.    Educated that she cannot expect to sleep solid through the night.  Explained.   Also disc sleep hygiene.   She's frustrated with sleep.    Disc fall risk with sleep meds.  Have tried the safest meds. She has chronic insomnia. Still not sleeping very well.  And maybe even worse.  DC mirtazapine   Trial quetiapine 25 mg HS off label for sleep and anxiety.   Disc fall risk and time frame of effect.  Supportive therapy dealing with duloxetine .  Repeat vitamin D  level was 47.  Previously high.  Restarted 2000 units daily.      FU 6 mos or longer per her request  Lorene Macintosh, MD, DFAPA   Please see After Visit Summary for patient specific instructions.  Future Appointments  Date Time Provider Department Center  01/30/2024  2:50 PM Knute Thersia Bitters, [place]  [facility] [id number]  01/30/2024  3:45 PM [facility] COUMADIN  CLINIC 2 [facility] H&V  02/06/2024  9:00 AM Corey Rufus HERO, MD [facility] None  02/27/2024  4:00 PM Georgina Ozell LABOR, MD [facility] None    No orders of the defined types were placed in this encounter.      -------------------------------

## 2024-01-29 NOTE — Progress Notes (Signed)
 Spoke with gave her bx results and treatment recommendations--pt said she is already sched for mohs

## 2024-01-30 ENCOUNTER — Encounter (HOSPITAL_BASED_OUTPATIENT_CLINIC_OR_DEPARTMENT_OTHER): Payer: Self-pay | Admitting: Family Medicine

## 2024-01-30 ENCOUNTER — Ambulatory Visit: Attending: Cardiovascular Disease | Admitting: *Deleted

## 2024-01-30 ENCOUNTER — Ambulatory Visit (HOSPITAL_BASED_OUTPATIENT_CLINIC_OR_DEPARTMENT_OTHER): Admitting: Family Medicine

## 2024-01-30 VITALS — BP 119/66 | HR 63 | Temp 98.6°F | Ht 62.0 in | Wt 140.6 lb

## 2024-01-30 DIAGNOSIS — Z7901 Long term (current) use of anticoagulants: Secondary | ICD-10-CM | POA: Diagnosis not present

## 2024-01-30 DIAGNOSIS — E039 Hypothyroidism, unspecified: Secondary | ICD-10-CM | POA: Diagnosis not present

## 2024-01-30 DIAGNOSIS — I2699 Other pulmonary embolism without acute cor pulmonale: Secondary | ICD-10-CM | POA: Diagnosis not present

## 2024-01-30 DIAGNOSIS — E119 Type 2 diabetes mellitus without complications: Secondary | ICD-10-CM | POA: Diagnosis not present

## 2024-01-30 DIAGNOSIS — Z86718 Personal history of other venous thrombosis and embolism: Secondary | ICD-10-CM

## 2024-01-30 DIAGNOSIS — Z13820 Encounter for screening for osteoporosis: Secondary | ICD-10-CM | POA: Diagnosis not present

## 2024-01-30 DIAGNOSIS — D631 Anemia in chronic kidney disease: Secondary | ICD-10-CM | POA: Diagnosis not present

## 2024-01-30 DIAGNOSIS — Z23 Encounter for immunization: Secondary | ICD-10-CM

## 2024-01-30 DIAGNOSIS — Z780 Asymptomatic menopausal state: Secondary | ICD-10-CM | POA: Diagnosis not present

## 2024-01-30 DIAGNOSIS — N189 Chronic kidney disease, unspecified: Secondary | ICD-10-CM | POA: Diagnosis not present

## 2024-01-30 DIAGNOSIS — N1831 Chronic kidney disease, stage 3a: Secondary | ICD-10-CM | POA: Diagnosis not present

## 2024-01-30 DIAGNOSIS — F5101 Primary insomnia: Secondary | ICD-10-CM | POA: Insufficient documentation

## 2024-01-30 DIAGNOSIS — M25552 Pain in left hip: Secondary | ICD-10-CM | POA: Diagnosis not present

## 2024-01-30 LAB — BASIC METABOLIC PANEL WITH GFR
BUN/Creatinine Ratio: 21 (ref 12–28)
BUN: 32 mg/dL — ABNORMAL HIGH (ref 8–27)
CO2: 21 mmol/L (ref 20–29)
Calcium: 9.5 mg/dL (ref 8.7–10.3)
Chloride: 102 mmol/L (ref 96–106)
Creatinine, Ser: 1.5 mg/dL — ABNORMAL HIGH (ref 0.57–1.00)
Glucose: 106 mg/dL — ABNORMAL HIGH (ref 70–99)
Potassium: 4.4 mmol/L (ref 3.5–5.2)
Sodium: 140 mmol/L (ref 134–144)
eGFR: 35 mL/min/1.73 — ABNORMAL LOW (ref >59)

## 2024-01-30 LAB — CBC WITH DIFFERENTIAL/PLATELET
Basophils Absolute: 0 x10E3/uL (ref 0.0–0.2)
Basos: 1 %
EOS (ABSOLUTE): 0.1 x10E3/uL (ref 0.0–0.4)
Eos: 2 %
Hematocrit: 33.9 % — ABNORMAL LOW (ref 34.0–46.6)
Hemoglobin: 11.4 g/dL (ref 11.1–15.9)
Immature Grans (Abs): 0 x10E3/uL (ref 0.0–0.1)
Immature Granulocytes: 0 %
Lymphocytes Absolute: 1.2 x10E3/uL (ref 0.7–3.1)
Lymphs: 19 %
MCH: 31.9 pg (ref 26.6–33.0)
MCHC: 33.6 g/dL (ref 31.5–35.7)
MCV: 95 fL (ref 79–97)
Monocytes Absolute: 0.6 x10E3/uL (ref 0.1–0.9)
Monocytes: 9 %
Neutrophils Absolute: 4.4 x10E3/uL (ref 1.4–7.0)
Neutrophils: 69 %
Platelets: 213 x10E3/uL (ref 150–450)
RBC: 3.57 x10E6/uL — ABNORMAL LOW (ref 3.77–5.28)
RDW: 12.5 % (ref 11.7–15.4)
WBC: 6.3 x10E3/uL (ref 3.4–10.8)

## 2024-01-30 LAB — TSH: TSH: 2.39 u[IU]/mL (ref 0.450–4.500)

## 2024-01-30 LAB — POCT INR: INR: 3 (ref 2.0–3.0)

## 2024-01-30 LAB — HEMOGLOBIN A1C
Est. average glucose Bld gHb Est-mCnc: 117 mg/dL
Hgb A1c MFr Bld: 5.7 % — ABNORMAL HIGH (ref 4.8–5.6)

## 2024-01-30 NOTE — Progress Notes (Signed)
 Description   INR 3.0; Have a green leafy veggie today then continue taking warfarin 1 tablet daily except 1/2 tablet on Saturday and Sunday. Recheck INR in 4 weeks. Anticoagulation Clinic  [phone]

## 2024-01-30 NOTE — Patient Instructions (Signed)
 Get your Eye exam done

## 2024-01-30 NOTE — Patient Instructions (Signed)
 Description   INR 3.0; Have a green leafy veggie today then continue taking warfarin 1 tablet daily except 1/2 tablet on Saturday and Sunday. Recheck INR in 4 weeks. Anticoagulation Clinic  [phone]

## 2024-01-30 NOTE — Progress Notes (Signed)
 Subjective:   Andrea Simmons 09-21-1940 01/30/2024  Chief Complaint  Patient presents with   Office visit    4 month follow up/Left hip and knee pain    Discussed the use of AI scribe software for clinical note transcription with the patient, who gave verbal consent to proceed.  History of Present Illness Andrea Simmons is an 83 year old female with chronic kidney disease and hypertension who presents for follow-up and lab work.  She is currently taking amlodipine  for blood pressure and Farxiga  daily. Her blood pressure readings are better at the clinic than at home. She has gained weight, which she attributes to an increased appetite, but she is trying to reduce her intake.  She takes fenofibrate for cholesterol and Lasix  as needed for ankle swelling, which is less severe than before. She finds wearing support hose difficult due to hip and knee issues.  She has been experiencing left hip pain, which starts from her toe, runs up her leg, and settles in her hip. She has seen Dr. Burnetta at [facility], who initially considered hip surgery but did not proceed with further imaging due to previous extensive x-rays. She is requesting a referral to new orthopedic with Dr. Georgina.   She has a history of Sjogren's syndrome and is under regular eye care. She experiences tingling in her toes at night after removing her shoes and socks, which is uncomfortable but not painful.  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  CHRONIC KIDNEY DISEASE: DESTANY SEVERNS presents for the medical management of Chronic Kidney Disease.  Patient is  adhering to renal diet. Patient is  on ACE1/ARB therapy (Valsartan  320mg ).  Patient is  avoiding NSAIDS.     Does not attend dialysis and does not perform peritoneal dialysis.   Lab Results  Component Value Date   NA 140 01/30/2024   K 4.4 01/30/2024   CO2 21  01/30/2024   GLUCOSE 106 (H) 01/30/2024   BUN 32 (H) 01/30/2024   CREATININE 1.50 (H) 01/30/2024   CALCIUM  9.5 01/30/2024   GFR 33.40 (L) 02/16/2022   EGFR 35 (L) 01/30/2024   GFRNONAA 41 (L) 06/24/2023     DIABETES MELLITUS: DAMIEN CISAR presents for the medical management of diabetes.  Current diabetes medication regimen: Farxiga  5mg  Patient is  adhering to a diabetic diet.  Patient is  exercising regularly.  Patient is  checking BS regularly.  Patient is  checking their feet regularly.  Denies polydipsia, polyphagia, polyuria, open wounds or ulcers on feet.  Lab Results  Component Value Date   HGBA1C 5.7 (H) 01/30/2024    Foot Exam: 01/30/2024 Lab Results  Component Value Date   MICROALBUR 10 07/31/2023    Wt Readings from Last 3 Encounters:  01/30/24 140 lb 9.6 oz (63.8 kg)  10/10/23 137 lb 1.6 oz (62.2 kg)  08/23/23 140 lb 4.8 oz (63.6 kg)     HYPOTHYROIDISM: Patient presents for the medical management of Hypothyroidism. Current medication: Levothyroxine  25mcg Patient compliant with medication regimen: yes   Lab Results  Component Value Date   TSH 2.390 01/30/2024    The following portions of the patient's history were reviewed and updated as appropriate: past medical history, past surgical history, family history, social history, allergies, medications, and problem list.   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   Grade I diastolic dysfunction 04/04/2023   Snoring 11/13/2022   Tear film insufficiency 04/19/2021   Sjogren's disease 01/24/2021   Hereditary and idiopathic neuropathy, unspecified 12/10/2020   Osteoarthritis 12/10/2020   Esophageal dysphagia 09/16/2020   Vitamin D  deficiency 10/01/2019   Polypharmacy 10/01/2019   Major depressive disorder,  recurrent episode, moderate (HCC) 10/01/2019   Hx of pulmonary embolus 03/30/2017   Hyperlipidemia 12/26/2013   Encounter for therapeutic drug monitoring 04/16/2013   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   Diabetes type 2, controlled (HCC) 08/27/2008   Seasonal and perennial allergic rhinitis 08/27/2008   Lung nodule 08/27/2008   Fibromyalgia 08/27/2008   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 (HCC) 03/20/2007   Hypothyroidism 03/20/2007   Essential hypertension 03/20/2007   Recurrent pulmonary emboli (HCC) 03/20/2007   GERD 03/20/2007   Past Medical History:  Diagnosis Date   Abnormal gait 12/10/2020   Acute venous embolism and thrombosis of deep vessels of proximal lower extremity (HCC) 12/04/2022   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) 05/15/2022   Anxiety    Breast cancer (HCC)    Chest pain of uncertain etiology 12/25/2018   Chronic osteoarthritis    CKD stage 3b, GFR 30-44 ml/min (HCC) 01/25/2021   Dehydration 09/16/2020   Depression    Diabetes mellitus type II    Diabetes type 2, controlled (HCC) 08/27/2008   Qualifier: Diagnosis of   By: Norleen MD, Lynwood ORN        Elevated lipase 09/16/2020   ESOPHAGEAL STRICTURE 03/08/2007   Qualifier: Diagnosis of   By: Joshua RN, CGRN, Sheri         Essential hypertension 03/20/2007   Qualifier: Diagnosis of   By: Joshua RN, CGRN, Sheri         Exertional dyspnea 11/09/2008   11/20/2017  Walked RA x 3 laps @ 185 ft each stopped due to  End of study, fast pace, no desat, min sob   - Echo 12/05/2017 - LVEF 60-65%, moderate LVH, normal wall motion, grade 1 DD,   indeterminate LV filling pressure, normal GLS at -21%, normal LA   size, trivial TR, RVSP 20 mmHg, normal IVC.           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 Corey 12/12/2023   Squamous cell carcinoma in situ 01/09/2024   left lower leg- anteiror- NEEDS MOHS   Squamous cell carcinoma of skin 10/16/2023   Left Anterior Neck- tx Mohs with Dr. Corey 11/14/2023   Squamous cell carcinoma of skin 10/16/2023   right lower leg-anterior Mohs Appt with Dr. Corey 11/28/2023   Past Surgical History:  Procedure Laterality Date   APPENDECTOMY     BALLOON DILATION N/A 10/18/2020   Procedure: MERRILL DILATION;  Surgeon: Cindie Carlin POUR, DO;  Location: [facility];  Service: Endoscopy;  Laterality: N/A;   BIOPSY  10/18/2020   Procedure: BIOPSY;  Surgeon: Cindie Carlin POUR, DO;  Location: [facility];  Service: Endoscopy;;  gastric    BREAST BIOPSY Left 1993   BREAST BIOPSY Right 09/25/2014   stero. Benign   BREAST RECONSTRUCTION Left    CATARACT EXTRACTION     ESOPHAGOGASTRODUODENOSCOPY (EGD) WITH PROPOFOL  N/A 10/18/2020   Procedure: ESOPHAGOGASTRODUODENOSCOPY (EGD) WITH PROPOFOL ;  Surgeon: Cindie Carlin POUR, DO;  Location: [facility];  Service: Endoscopy;  Laterality: N/A;  11:00am   MASTECTOMY Left    ROTATOR CUFF REPAIR     SPINAL FUSION      x 2   TOTAL ABDOMINAL HYSTERECTOMY W/ BILATERAL SALPINGOOPHORECTOMY     TUBAL LIGATION     Family History  Problem Relation Age of Onset   Prostate cancer Maternal Uncle    Hypertension Maternal Grandfather    Breast cancer Cousin    Breast cancer Cousin 36   Depression Other        Parent   Arthritis Other        Parent, Grandparent   Hypertension Other        Grandparent   Hyperlipidemia Other        FMH   Miscarriages / Stillbirths Other        Grandparent   Stroke Other        [facility]   Outpatient Medications Prior to Visit  Medication Sig Dispense Refill   ACCU-CHEK GUIDE  test strip CHECK BLOOD SUGARS DAILY 100 strip 12   Accu-Chek Softclix Lancets lancets USE AS INSTRUCTED TO TEST SUGARS TWICE A DAY 100 each 12   amLODipine  (NORVASC ) 10 MG tablet TAKE 1 TABLET BY MOUTH EVERY DAY 90 tablet 2   Biotin 1000 MCG tablet Take 1,000 mcg by mouth daily.     Blood Glucose Monitoring Suppl (ACCU-CHEK GUIDE) w/Device KIT 1 each by Does not apply route 2 (two) times daily. To test sugars. Dx. E11.9 1 kit 1   dapagliflozin  propanediol (FARXIGA ) 5 MG TABS tablet Take 1 tablet (5 mg total) by mouth daily. 30 tablet 3   DULoxetine  (CYMBALTA ) 30 MG capsule Take 3 capsules (90 mg total) by mouth daily. 270 capsule 0   fenofibrate (TRICOR) 48 MG tablet Take 48 mg by mouth daily.     FLAREX 0.1 % ophthalmic suspension Apply to eye.     furosemide  (LASIX ) 20 MG tablet TAKE 1 TABLET BY MOUTH EVERY DAY AS NEEDED 90 tablet 2   hydrALAZINE  (APRESOLINE ) 50 MG tablet Take 1 tablet (50 mg total) by mouth 2 (two) times daily. 180 tablet 1   labetalol  (NORMODYNE ) 200 MG tablet TAKE 1 TABLET BY MOUTH TWICE A DAY 180 tablet 2   levothyroxine  (SYNTHROID ) 25 MCG tablet TAKE 1 TABLET BY MOUTH EVERY DAY BEFORE BREAKFAST 90 tablet 3   Magnesium  500 MG TABS Take 500 mg by mouth daily.     mupirocin  ointment (BACTROBAN ) 2 % Apply 1 Application topically 2 (two) times daily. 22 g 3   QUEtiapine (SEROQUEL) 25 MG tablet Take 1 tablet (25 mg total) by mouth at bedtime. 30 tablet 1   rosuvastatin  (CRESTOR ) 10 MG tablet TAKE 1 TABLET BY MOUTH EVERY DAY 90 tablet 1   triamcinolone  cream (KENALOG ) 0.1 % Apply 1 Application topically daily as needed. To heel BID as needed 45 g 1   triamcinolone  ointment (KENALOG ) 0.1 % Apply 1 Application topically 2 (two) times daily. To affected areas (Back) 60 g 3   valsartan  (DIOVAN ) 320  MG tablet TAKE 1 TABLET BY MOUTH EVERY DAY 90 tablet 2   warfarin (COUMADIN ) 5 MG tablet TAKE 1 (5 MG) TABLET DAILY BY MOUTH EXCEPT TAKE 1/2 TABLET (2.5 MG) ON FRIDAY, SATURDAY, AND  SUNDAYS OR AS DIRECTED BY ANTICOAGULATION CLINIC.     No facility-administered medications prior to visit.   Allergies  Allergen Reactions   Lipitor [Atorvastatin]     Unknown   Morphine  Nausea And Vomiting   Meperidine Nausea And Vomiting   Naproxen Sodium Nausea And Vomiting     ROS: A complete ROS was performed with pertinent positives/negatives noted in the HPI. The remainder of the ROS are negative.    Objective:   Today's Vitals   01/30/24 1404  BP: 119/66  Pulse: 63  Temp: 98.6 F (37 C)  Weight: 140 lb 9.6 oz (63.8 kg)  Height: 5' 2 (1.575 m)    Physical Exam   GENERAL: Well-appearing, in NAD. Well nourished.  SKIN: Pink, warm and dry. No rash, lesion, ulceration, or ecchymoses.  Head: Normocephalic. NECK: Trachea midline. Full ROM w/o pain or tenderness RESPIRATORY: Chest wall symmetrical. Respirations even and non-labored. Breath sounds clear to auscultation bilaterally.  CARDIAC: S1, S2 present, regular rate and rhythm without murmur or gallops. Peripheral pulses 2+ bilaterally.  MSK: Muscle tone and strength appropriate for age. Mild tenderness to left hip.  EXTREMITIES: Without clubbing, cyanosis, or edema.  NEUROLOGIC: No motor or sensory deficits. Steady, even gait. C2-C12 intact.  PSYCH/MENTAL STATUS: Alert, oriented x 3. Cooperative, appropriate mood and affect.   Diabetic Foot Exam - Simple   Simple Foot Form Diabetic Foot exam was performed with the following findings: Yes 01/30/2024  2:45 PM  Visual Inspection No deformities, no ulcerations, no other skin breakdown bilaterally: Yes Sensation Testing Intact to touch and monofilament testing bilaterally: Yes Pulse Check Posterior Tibialis and Dorsalis pulse intact bilaterally: Yes Comments     Health Maintenance Due  Topic Date Due   OPHTHALMOLOGY EXAM  02/15/2022    The ASCVD Risk score (Arnett DK, et al., 2019) failed to calculate for the following reasons:   The 2019 ASCVD risk score  is only valid for ages 17 to 33     Assessment & Plan:  1. Controlled type 2 diabetes mellitus without complication, without long-term current use of insulin  (HCC) (Primary) Previously well controlled. Will continue on Farxiga  for DM and CVD benefit.  - Hemoglobin A1c  2. CKD stage 3a, GFR 45-59 ml/min (HCC) Controlled previously. Will check BMP with labs today, continue Farxiga , renal diet and avoidance of NSAIDS.  - Basic Metabolic Panel (BMET)  3. Left hip pain Referral placed to Dr. Georgina per patient request.  - Ambulatory referral to Orthopedic Surgery  4. Anemia due to chronic kidney disease, unspecified CKD stage Discussed good dietary intak eof irion in regards to renal diet. Will check CBC with labs today.  - CBC with Differential/Platelet  5. Acquired hypothyroidism Stable previously on Levothyroxine  . Check TSH today.  - TSH  6. Encounter for osteoporosis screening in asymptomatic postmenopausal patient Discussed limitations of screening given patient's age but patient would like to proceed with screening. Order placed.  - DG Bone Density; Future  7. Immunization due - Flu vaccine HIGH DOSE PF(Fluzone Trivalent)    No orders of the defined types were placed in this encounter.  Lab Orders         Basic Metabolic Panel (BMET)         TSH  Hemoglobin A1c         CBC with Differential/Platelet     No images are attached to the encounter or orders placed in the encounter.  No follow-ups on file.    Patient to reach out to office if new, worrisome, or unresolved symptoms arise or if no improvement in patient's condition. Patient verbalized understanding and is agreeable to treatment plan. All questions answered to patient's satisfaction.    Thersia Schuyler Stark, [place]

## 2024-01-31 ENCOUNTER — Ambulatory Visit (HOSPITAL_BASED_OUTPATIENT_CLINIC_OR_DEPARTMENT_OTHER): Payer: Self-pay | Admitting: Family Medicine

## 2024-01-31 DIAGNOSIS — N1831 Chronic kidney disease, stage 3a: Secondary | ICD-10-CM

## 2024-01-31 NOTE — Progress Notes (Signed)
 Please call Andrea Simmons and let her know her kidney function has worsened slightly. I would like her to increase her clear fluids and return in 7-10 days to repeat BMP. Her A1C has worsened slightly but remains controlled under 7.0 at 5.7. She can continue to monitor sugar intake. Blood counts and thyroid  function are stable.

## 2024-02-03 ENCOUNTER — Telehealth (HOSPITAL_BASED_OUTPATIENT_CLINIC_OR_DEPARTMENT_OTHER): Payer: Self-pay | Admitting: Family Medicine

## 2024-02-03 NOTE — Telephone Encounter (Signed)
 Copied from CRM #[id number]. Topic: Clinical - Lab/Test Results >> Feb 01, 2024  3:56 PM Santiya F wrote: Reason for CRM: Patient is calling in for her lab results. Verbally gave patient her results. Patient is requesting to have a copy of her lab results mailed to her. She also wanted to speak with a nurse about if she should see her kidney specialist instead of coming back to the office. Please advise.

## 2024-02-04 NOTE — Telephone Encounter (Signed)
 Labwork printed and placed in the mail for pt at her request. Routing to [place] for her to review about message sent by pt regarding kidney specialist.

## 2024-02-05 NOTE — Telephone Encounter (Signed)
 Called and spoke with pt letting her know the info per alexis and she verbalized understanding. Nothing further needed.

## 2024-02-06 ENCOUNTER — Encounter: Payer: Self-pay | Admitting: Dermatology

## 2024-02-06 ENCOUNTER — Ambulatory Visit: Admitting: Dermatology

## 2024-02-06 VITALS — BP 125/60 | HR 58 | Temp 98.1°F

## 2024-02-06 DIAGNOSIS — D0472 Carcinoma in situ of skin of left lower limb, including hip: Secondary | ICD-10-CM

## 2024-02-06 DIAGNOSIS — L814 Other melanin hyperpigmentation: Secondary | ICD-10-CM

## 2024-02-06 DIAGNOSIS — T1490XD Injury, unspecified, subsequent encounter: Secondary | ICD-10-CM

## 2024-02-06 DIAGNOSIS — W908XXA Exposure to other nonionizing radiation, initial encounter: Secondary | ICD-10-CM | POA: Diagnosis not present

## 2024-02-06 DIAGNOSIS — C4492 Squamous cell carcinoma of skin, unspecified: Secondary | ICD-10-CM

## 2024-02-06 DIAGNOSIS — L578 Other skin changes due to chronic exposure to nonionizing radiation: Secondary | ICD-10-CM | POA: Diagnosis not present

## 2024-02-06 DIAGNOSIS — Z48817 Encounter for surgical aftercare following surgery on the skin and subcutaneous tissue: Secondary | ICD-10-CM | POA: Diagnosis not present

## 2024-02-06 DIAGNOSIS — L570 Actinic keratosis: Secondary | ICD-10-CM | POA: Diagnosis not present

## 2024-02-06 NOTE — Progress Notes (Signed)
 Follow-Up Visit   Subjective  Andrea Simmons is a 83 y.o. 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 Corey.   The following portions of the chart were reviewed this encounter and updated as appropriate: medications, allergies, medical history  She is also s/p Mohs surgery for a SCC on the right anterior ankle, treated on 12/12/2023, healing by second intention. . The patient has been bandaging the wound as directed. The endorse the following concerns: none.   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   Review of Systems:  No other skin or systemic complaints except as noted in HPI or Assessment and Plan.  Objective  Well appearing patient in no apparent distress; mood and affect are within normal limits.  A focused examination was performed of the following areas: Left lower leg anterior Right lower leg Relevant physical exam findings are noted in the Assessment and Plan.   Left Lower Leg - Anterior Hyperkeratotic papule   Assessment & Plan   SQUAMOUS CELL CARCINOMA OF SKIN Left Lower Leg - Anterior Mohs surgery  Consent obtained: written  Anticoagulation: Was the anticoagulation regimen changed prior to Mohs? No    Anesthesia: Anesthesia method: local infiltration Local anesthetic: lidocaine  1% WITH epi  Procedure Details: Timeout: pre-procedure verification complete Procedure Prep: patient was prepped and draped in usual sterile fashion Prep type: chlorhexidine Biopsy accession number: [id number] Pre-Op diagnosis: squamous cell carcinoma SCC subtype: in situ MohsAIQ Surgical site (if tumor spans multiple areas, please select predominant area): lower limb (including hip) Surgery side: left Surgical site (from skin exam): Left Lower Leg - Anterior Pre-operative length  (cm): 0.8 Pre-operative width (cm): 0.7 Indications for Mohs surgery: anatomic location where tissue conservation is critical  Micrographic Surgery Details: Post-operative length (cm): 1.8 Post-operative width (cm): 1.5 Number of Mohs stages: 1  Stage 1    Comments: Final measurements 6 cm    Tumor features identified on Mohs section: no tumor identified  Reconstruction: Was the defect reconstructed? Yes   Was reconstruction performed by the same Mohs surgeon? Yes   Setting of reconstruction: outpatient office When was reconstruction performed? same day Type of reconstruction: linear Linear reconstruction: complex  Skin repair Complexity:  Complex Final length (cm):  6 Informed consent: discussed and consent obtained   Timeout: patient name, date of birth, surgical site, and procedure verified   Procedure prep:  Patient was prepped and draped in usual sterile fashion Prep type:  Chlorhexidine Anesthesia: the lesion was anesthetized in a standard fashion   Anesthetic:  1% lidocaine  w/ epinephrine 1-100,000 buffered w/ 8.4% NaHCO3 Reason for type of repair: preserve normal anatomy, preserve normal anatomical and functional relationships and avoid adjacent structures   Undermining: area extensively undermined   Subcutaneous layers (deep stitches):  Suture size:  3-0 Suture type: Vicryl (polyglactin 910)   Stitches:  Buried vertical mattress Fine/surface layer approximation (top stitches):  Suture type: cyanoacrylate tissue glue   Hemostasis achieved with: suture, pressure and electrodesiccation Outcome: patient tolerated procedure well with no complications   Post-procedure details: sterile dressing applied and wound care instructions given   Dressing type: bandage and pressure dressing    AK (ACTINIC KERATOSIS) (3) Right 2nd Metacarpophalangeal Region, Right Palmar Middle 2nd Finger, Right Thenar Eminence Destruction of lesion - Right 2nd Metacarpophalangeal Region, Right  Palmar Middle 2nd  Finger, Right Thenar Eminence Complexity: simple   Destruction method: cryotherapy   Informed consent: discussed and consent obtained   Timeout:  patient name, date of birth, surgical site, and procedure verified Lesion destroyed using liquid nitrogen: Yes   Region frozen until ice ball extended beyond lesion: Yes   Outcome: patient tolerated procedure well with no complications   Post-procedure details: wound care instructions given     Healing Wound s/p Mohs for SCC on the right ankle-anterior, treated on 12/12/2023, healing by second intention.  - Reassured that wound is healing well - No evidence of infection - No swelling, induration, purulence, dehiscence, or tenderness out of proportion to the clinical exam, see photo above - Discussed that scars take up to 12 months to mature from the date of surgery - Recommend SPF 30+ to scar daily to prevent purple color from UV exposure during scar maturation process - Discussed that erythema and raised appearance of scar will fade over the next 4-6 months - OK to start scar massage at 4-6 weeks post-op - Can consider silicone based products for scar healing starting at 6 weeks post-op - Ok to continue ointment daily to wound under a bandage for another 4 weeks  Healing Wound s/p Mohs for an SCC on the left lower leg, treated on 11/28/2023, healing by second intention - Reassured that wound has healed well - Discussed that scars take up to 12 months to mature from the date of surgery - Recommend SPF 30+ to scar daily to prevent purple color - OK to start scar massage at 4-6 weeks post-op - Can consider silicone based products for scar healing  HISTORY OF SQUAMOUS CELL CARCINOMA OF THE SKIN - No evidence of recurrence today - No lymphadenopathy - Recommend regular full body skin exams - Recommend daily broad spectrum sunscreen SPF 30+ to sun-exposed areas, reapply every 2 hours as needed.  - Call if any new or changing  lesions are noted between office visits  Return in about 4 weeks (around 03/05/2024) for wound check.  I, Doyce Pan, CMA, am acting as scribe for RUFUS CHRISTELLA HOLY, MD.    02/06/2024  HISTORY OF PRESENT ILLNESS  Andrea Simmons is seen in consultation at the request of Dr. 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 They note that the area has been present for about 2 months increasing in size with time.  There is no history of previous treatment.  Reports no other new or changing lesions and has no other complaints today.  Medications and allergies: see patient chart.  Review of systems: Reviewed 8 systems and notable for the above skin cancer.  All other systems reviewed are unremarkable/negative, unless noted in the HPI. Past medical history, surgical history, family history, social history were also reviewed and are noted in the chart/questionnaire.    PHYSICAL EXAMINATION  General: Well-appearing, in no acute distress, alert and oriented x 4. Vitals reviewed in chart (if available).   Skin: Exam reveals a 0.8 x 0.7 cm erythematous papule and biopsy scar on the left lower leg anterior. There are rhytids, telangiectasias, and lentigines, consistent with photodamage.  Biopsy report(s) reviewed, confirming the diagnosis.   ASSESSMENT  1) Squamous Carcinoma in Situ of the left lower leg anterior 2) photodamage 3) solar lentigines   PLAN   1. Due to location, size, histology, or recurrence and the likelihood of subclinical extension as well as the need to conserve normal surrounding tissue, the patient  was deemed acceptable for Mohs micrographic surgery (MMS).  The nature and purpose of the procedure, associated benefits and risks including recurrence and scarring, possible complications such as pain, infection, and bleeding, and alternative methods of treatment if appropriate were discussed with the patient during consent. The lesion location  was verified by the patient, by reviewing previous notes, pathology reports, and by photographs as well as angulation measurements if available.  Informed consent was reviewed and signed by the patient, and timeout was performed at 9:00 AM. See op note below.  2. For the photodamage and solar lentigines, sun protection discussed/information given on OTC sunscreens, and we recommend continued regular follow-up with primary dermatologist every 6 months or sooner for any growing, bleeding, or changing lesions. 3. Prognosis and future surveillance discussed. 4. Letter with treatment outcome sent to referring provider. 5. Pain acetaminophen /ibuprofen   MOHS MICROGRAPHIC SURGERY AND RECONSTRUCTION  Initial size:   0.8 x 0.7 cm Surgical defect/wound size: 1.8 x 1.5 cm Anesthesia:    0.33% lidocaine  with 1:200,000 epinephrine EBL:    <5 mL Complications:  None Repair type:   Complex SQ suture:   3-0 Vicryl Cutaneous suture:  Cyanoacrylate and Steristrips Final size of the repair: 6.0 cm  Stages: 1  STAGE I: Anesthesia achieved with 0.5% lidocaine  with 1:200,000 epinephrine. ChloraPrep applied. 1 section(s) excised using Mohs technique (this includes total peripheral and deep tissue margin excision and evaluation with frozen sections, excised and interpreted by the same physician). The tumor was first debulked and then excised with an approx. 2mm margin.  Hemostasis was achieved with electrocautery as needed.  The specimen was then oriented, subdivided/relaxed, inked, and processed using Mohs technique.    Frozen section analysis revealed a clear deep and peripheral margin.  Reconstruction  The surgical wound was then cleaned, prepped, and re-anesthetized as above. Wound edges were undermined extensively along at least one entire edge and at a distance equal to or greater than the width of the defect (see wound defect size above) in order to achieve closure and decrease wound tension and anatomic  distortion. Redundant tissue repair including standing cone removal was performed. Hemostasis was achieved with electrocautery. Subcutaneous and epidermal tissues were approximated with the above sutures. The surgical site was then lightly scrubbed with sterile, saline-soaked gauze. Steri-strips were applied, and the area was then bandaged using Vaseline ointment, non-adherent gauze, gauze pads, and tape to provide an adequate pressure dressing. The patient tolerated the procedure well, was given detailed written and verbal wound care instructions, and was discharged in good condition.   The patient will follow-up: 4 weeks.    Documentation: I have reviewed the above documentation for accuracy and completeness, and I agree with the above.  RUFUS CHRISTELLA HOLY, MD

## 2024-02-06 NOTE — Patient Instructions (Signed)

## 2024-02-07 ENCOUNTER — Ambulatory Visit: Admitting: Dermatology

## 2024-02-12 ENCOUNTER — Other Ambulatory Visit (HOSPITAL_BASED_OUTPATIENT_CLINIC_OR_DEPARTMENT_OTHER): Payer: Self-pay | Admitting: *Deleted

## 2024-02-12 DIAGNOSIS — N1831 Chronic kidney disease, stage 3a: Secondary | ICD-10-CM | POA: Diagnosis not present

## 2024-02-12 LAB — BASIC METABOLIC PANEL WITH GFR
BUN/Creatinine Ratio: 23 (ref 12–28)
BUN: 30 mg/dL — ABNORMAL HIGH (ref 8–27)
CO2: 19 mmol/L — ABNORMAL LOW (ref 20–29)
Calcium: 9.2 mg/dL (ref 8.7–10.3)
Chloride: 102 mmol/L (ref 96–106)
Creatinine, Ser: 1.3 mg/dL — ABNORMAL HIGH (ref 0.57–1.00)
Glucose: 119 mg/dL — ABNORMAL HIGH (ref 70–99)
Potassium: 4.4 mmol/L (ref 3.5–5.2)
Sodium: 140 mmol/L (ref 134–144)
eGFR: 41 mL/min/1.73 — ABNORMAL LOW (ref >59)

## 2024-02-13 ENCOUNTER — Ambulatory Visit (HOSPITAL_BASED_OUTPATIENT_CLINIC_OR_DEPARTMENT_OTHER): Payer: Self-pay | Admitting: Family Medicine

## 2024-02-13 NOTE — Progress Notes (Signed)
 Please let Andrea Simmons know her kidney function returned to baseline. She should continue good clear hydration. I would like to move up her follow up to February/march instead of May.

## 2024-02-19 ENCOUNTER — Other Ambulatory Visit: Payer: Self-pay | Admitting: Psychiatry

## 2024-02-19 DIAGNOSIS — F5105 Insomnia due to other mental disorder: Secondary | ICD-10-CM

## 2024-02-19 DIAGNOSIS — G4721 Circadian rhythm sleep disorder, delayed sleep phase type: Secondary | ICD-10-CM

## 2024-02-19 DIAGNOSIS — F331 Major depressive disorder, recurrent, moderate: Secondary | ICD-10-CM

## 2024-02-20 ENCOUNTER — Other Ambulatory Visit: Payer: Self-pay | Admitting: Cardiology

## 2024-02-20 DIAGNOSIS — I10 Essential (primary) hypertension: Secondary | ICD-10-CM

## 2024-02-21 ENCOUNTER — Telehealth (HOSPITAL_BASED_OUTPATIENT_CLINIC_OR_DEPARTMENT_OTHER): Payer: Self-pay | Admitting: Family Medicine

## 2024-02-21 ENCOUNTER — Other Ambulatory Visit (HOSPITAL_BASED_OUTPATIENT_CLINIC_OR_DEPARTMENT_OTHER): Payer: Self-pay | Admitting: Family Medicine

## 2024-02-21 DIAGNOSIS — Z7901 Long term (current) use of anticoagulants: Secondary | ICD-10-CM

## 2024-02-21 MED ORDER — WARFARIN SODIUM 5 MG PO TABS: ORAL_TABLET | ORAL | 1 refills | Status: AC

## 2024-02-21 NOTE — Telephone Encounter (Signed)
 Copied from CRM [id number]. Topic: Clinical - Lab/Test Results >> Feb 05, 2024  3:15 PM Leonette P wrote: Reason for CRM: Pt called saying [facility] wants a copy of her notes and labwork so they can make her an appt.  She was referred over there.  She does not know their fax number. >> Feb 21, 2024  3:33 PM Lauren C wrote: Rerouted   I do not see a referral to [facility], original message is from 02/05/24 and rerouted today?

## 2024-02-21 NOTE — Telephone Encounter (Signed)
   Message from Tinnie BROCKS sent at 02/21/2024  3:22 PM EST  Reason for Triage: Pt took last Warfarin today and has no refills. [facility] stated they had sent us  refill requests to her, but none seen in chart. She is very concerned due to hx of blood clots. Established with [employer].  Best callback #[phone]  [phone] - [place], [place] - [address] [place] [address] Phone: [phone] Fax: [phone]

## 2024-02-25 ENCOUNTER — Ambulatory Visit

## 2024-02-25 MED ORDER — LABETALOL HCL 200 MG PO TABS: 200.0000 mg | ORAL_TABLET | Freq: Two times a day (BID) | ORAL | 1 refills | Status: AC

## 2024-02-25 NOTE — Telephone Encounter (Signed)
 OV note and labwork has been faxed to [facility].

## 2024-02-27 ENCOUNTER — Ambulatory Visit: Admitting: Orthopedic Surgery

## 2024-02-27 ENCOUNTER — Encounter: Payer: Self-pay | Admitting: Orthopedic Surgery

## 2024-02-27 ENCOUNTER — Other Ambulatory Visit (INDEPENDENT_AMBULATORY_CARE_PROVIDER_SITE_OTHER)

## 2024-02-27 VITALS — BP 122/69 | HR 71 | Ht 62.0 in | Wt 140.6 lb

## 2024-02-27 DIAGNOSIS — M25552 Pain in left hip: Secondary | ICD-10-CM | POA: Diagnosis not present

## 2024-02-27 DIAGNOSIS — M5416 Radiculopathy, lumbar region: Secondary | ICD-10-CM | POA: Diagnosis not present

## 2024-02-27 NOTE — Progress Notes (Signed)
 Orthopedic Spine Surgery Office Note  Assessment: Patient is a 83 y.o. female with left buttock pain.  Could be from the L5 nerve root but based on exam it also could be coming from her hip   Plan: -Explained to her that her exam seems to point towards the hip as the etiology of the pain but her area of pain is atypical for hip arthritis.  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  Due to the differing potential etiologies, recommended a diagnostic/therapeutic injection.  Referral provided to her today -Can continue with Tylenol  as needed for pain relief -Patient should return to office in 6 weeks, x-rays at next visit: none   Patient expressed understanding of the plan and all questions were answered to the patient's satisfaction.   ___________________________________________________________________________   History:  Patient is a 84 y.o. female who presents today for lumbar spine.  Patient has had a couple of months of pain in her left buttock.  There is no trauma or injury that preceded the onset of the pain.  She has had pain like this before but it is resolved in the past.  This pain has lingered around longer.  She notes the pain mostly with activity and improves with rest.  She does not have any pain radiating further down the leg.  No pain in the back.  She rates the pain as an 8 out of 10 at its worst.  She is    Treatments tried: tylenol , aleve  Review of systems: Denies fevers and chills, night sweats, unexplained weight loss. Has a history of breast cancer which has been in remission. Has had pain that wakes her at night   Past medical history: Fibromyalgia GERD History of DVT DM (last A1c was 5.7 on 01/30/2024) Depression CKD HTN HLD  Hypothyroidism History of breast cancer  Allergies: lipitor, morphine , meperidine, naproxen  Past surgical history:  Appendectomy Mastectomy Breast  reconstruction Rotator cuff repair Hysterectomy Cervical spine fusion Cataract surgery  Social history: Denies use of nicotine product (smoking, vaping, patches, smokeless) Alcohol  use: denies Denies recreational drug use   Physical Exam:  BMI of 25.7  General: no acute distress, appears stated age Neurologic: alert, answering questions appropriately, following commands Respiratory: unlabored breathing on room air, symmetric chest rise Psychiatric: appropriate affect, normal cadence to speech   MSK (spine):  -Strength exam      Left  Right EHL    5/5  5/5 TA    5/5  5/5 GSC    5/5  5/5 Knee extension  5/5  5/5 Hip flexion   5/5  5/5  -Sensory exam    Sensation intact to light touch in L3-S1 nerve distributions of bilateral lower extremities  -Achilles DTR: 1/4 on the left, 1/4 on the right -Patellar tendon DTR: 1/4 on the left, 1/4 on the right  -Straight leg raise: Negative bilaterally -Clonus: no beats bilaterally  -Left hip exam: Positive FADIR, negative FABER, positive Stinchfield, negative SI joint compression test -Right hip exam: No pain through range of motion  Imaging: XRs of the lumbar spine from 02/27/2024 were independently reviewed and interpreted, showing disc height loss and 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  No other significant degenerative changes seen.  No fracture or dislocation seen.  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  There is central and bilateral lateral recess stenosis at L4/5.  No other significant  stenosis seen.   Patient name: Andrea Simmons Patient MRN: [id number] Date of visit: 02/27/24

## 2024-03-04 ENCOUNTER — Ambulatory Visit

## 2024-03-05 ENCOUNTER — Ambulatory Visit: Attending: Cardiovascular Disease

## 2024-03-05 ENCOUNTER — Encounter: Payer: Self-pay | Admitting: Dermatology

## 2024-03-05 DIAGNOSIS — Z5181 Encounter for therapeutic drug level monitoring: Secondary | ICD-10-CM | POA: Diagnosis not present

## 2024-03-05 DIAGNOSIS — I2699 Other pulmonary embolism without acute cor pulmonale: Secondary | ICD-10-CM | POA: Diagnosis not present

## 2024-03-05 DIAGNOSIS — Z86718 Personal history of other venous thrombosis and embolism: Secondary | ICD-10-CM

## 2024-03-05 DIAGNOSIS — Z7901 Long term (current) use of anticoagulants: Secondary | ICD-10-CM

## 2024-03-05 LAB — POCT INR: INR: 2.7 (ref 2.0–3.0)

## 2024-03-05 NOTE — Progress Notes (Signed)
 Description   INR-2.7; Continue taking warfarin 1 tablet daily except 1/2 tablet on Saturday and Sunday. Recheck INR in 6 weeks. Anticoagulation Clinic  [phone]

## 2024-03-05 NOTE — Patient Instructions (Signed)
 Description   INR-2.7; Continue taking warfarin 1 tablet daily except 1/2 tablet on Saturday and Sunday. Recheck INR in 6 weeks. Anticoagulation Clinic  [phone]

## 2024-03-06 ENCOUNTER — Ambulatory Visit: Admitting: Dermatology

## 2024-03-14 ENCOUNTER — Other Ambulatory Visit: Payer: Self-pay | Admitting: Cardiology

## 2024-03-14 DIAGNOSIS — I10 Essential (primary) hypertension: Secondary | ICD-10-CM

## 2024-03-17 ENCOUNTER — Other Ambulatory Visit: Payer: Self-pay | Admitting: Dermatology

## 2024-03-17 DIAGNOSIS — L281 Prurigo nodularis: Secondary | ICD-10-CM

## 2024-03-27 ENCOUNTER — Ambulatory Visit: Admitting: Dermatology

## 2024-04-01 ENCOUNTER — Ambulatory Visit (INDEPENDENT_AMBULATORY_CARE_PROVIDER_SITE_OTHER): Admitting: Psychiatry

## 2024-04-01 ENCOUNTER — Encounter: Payer: Self-pay | Admitting: Psychiatry

## 2024-04-01 ENCOUNTER — Other Ambulatory Visit (HOSPITAL_BASED_OUTPATIENT_CLINIC_OR_DEPARTMENT_OTHER): Payer: Self-pay | Admitting: Family Medicine

## 2024-04-01 DIAGNOSIS — F4001 Agoraphobia with panic disorder: Secondary | ICD-10-CM | POA: Diagnosis not present

## 2024-04-01 DIAGNOSIS — F411 Generalized anxiety disorder: Secondary | ICD-10-CM | POA: Diagnosis not present

## 2024-04-01 DIAGNOSIS — M797 Fibromyalgia: Secondary | ICD-10-CM

## 2024-04-01 DIAGNOSIS — F331 Major depressive disorder, recurrent, moderate: Secondary | ICD-10-CM | POA: Diagnosis not present

## 2024-04-01 DIAGNOSIS — F5105 Insomnia due to other mental disorder: Secondary | ICD-10-CM

## 2024-04-01 DIAGNOSIS — G4721 Circadian rhythm sleep disorder, delayed sleep phase type: Secondary | ICD-10-CM

## 2024-04-01 MED ORDER — LEVOTHYROXINE SODIUM 25 MCG PO TABS: 25.0000 ug | ORAL_TABLET | Freq: Every day | ORAL | 1 refills | Status: AC

## 2024-04-01 NOTE — Progress Notes (Signed)
 Andrea Simmons [id number] 08/31/1940 84 y.o.  Subjective:   Patient ID:  Andrea MATSUO is a 84 y.o. (DOB 14-Sep-1940) female.  Chief Complaint:  Chief Complaint  Patient presents with   Follow-up   Depression   Anxiety   Sleeping Problem    Andrea Simmons presents  today for recent exacerbation of depression..    At visit was July 09, 2018.  Vraylar was discontinued for lack of response.  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  This was partially helpful.  When  seen Aug 15, 2018 and Ritalin  was increased to 15 mg twice daily because of partial response at the lower dose.  At visit and of July 2020.  She had a partial response to the Ritalin  for treatment resistant depression.  The Ritalin  was increased again to 20 mg twice daily.  seen December 04, 2018 & 02/2019.  Doesn't like winter.  Seasonal depression and crying more.  There were no med changes.   11/17/19 appt with the following noted: BP been higher and seeing Card and having med changes. Stopped Vit D bc level was high. Some discussion about the Ritalin  over the BP control. Some increase depression in part over health issues.  No marital problems.  No SI.  Issues with daughter are a stressor.  D said she was mean when D was growing up and was too strict.  Felt D was ungrateful and that she and H did the best they could do.  I can't get over that and has told D about this. Ritalin  with less energizing benefit and less productive than she was..  Wears off around supper time.  At night cannot break habit of snacks at night. Does not feel it kick in and wear off.  Back to old habits of staying up too late, now MN.  Needs 7-8 hours.  Always needed more than others.    I feel good taking it.  Most days feels pretty good.  Can have crying spells without reason even before the Ritalin .   Doing much more than I was.   H  still sick often too. Plan: Reduce Ritalin  to 10 mg twice daily due to loss of benefit and blood pressure Start change from duloxetine  to Trintellix  to help depression: Reduce duloxetine  to 3 of 30 mg capsules and start Trintellix  5 mg daily for 1 week, Then reduce duloxetine  to 2 of the 30 mg capsules and increase Trintellix  to 10 mg daily for 1 week, Then increase Trintellix  to 10 + 5 mg tablets and reduce duloxetine  to 1 of the 30 mg capsules for 1 week, Then stop Duloxetine  and increase Trintellix  to 20 mg daily (or 2 of the 10 mg tablets)  01/14/2020 appointment with the following noted: Is on Trintellix  20 mg daily since 12/08/19 approximately. Was really hard with the switch.  Got really low and now some better.  Had a lot of aches and fatigue for awhile for 5 weeks.  Wonders if she was sick.  She doesn't feel like Trintellix  is the right med. Gained 14# and still on low dose Ritalin .  Still having crying spells and poor energy but some days feels pretty good. No nausea or other SE noticed. A lot of worry over her health and BP and H's cellulitis again. Plan: Overall patient has not improved with switch from duloxetine  to Trintellix .  Her energy is not better and her mood is not better.  She is also quite anxious. Reduce Trintellix  to 10 mg 1 daily and start viibryd  10 mg daily for a week, then  Stop Trintellix  and increase Viibryd  to 20 mg daily with food.  tC 01/23/20 wanting to stop Ritalin  so she did.  01/30/20 TC : LM:  Suggested she restart vitamin D  2000 units daily.  Her vitamin D  level is a little low and we would like to see it in the 50s.  It is hoped that dose will push back into the 50s and if there is a question we will recheck it in 3 months. She has not been on Viibryd  20 mg long enough to see the full benefit of that.  She needs to continue Viibryd  20 mg for at least a full 4 weeks at that dosage so another 3 weeks or so.  At that point we could consider increasing it if  needed.   03/10/20 appt with following noted: Ritalin  didn't help and might have increased BP per card. On Viibryd  about 6 weeks at 20 mg. Read on viibryd   And concerned about taking with coumadin . Gotten where she can't sleep well.  Last week only averaging 3 hours per night.  Also irritable a lot in last 10 days. Gained 17#. Hungry. Worries about things more than in the past and some of it is age. [place] and Monday diarrhea.  Otherwise just at times. Plan: DC Ritalin  per her request. Poor response so far with Viibryd . Increase Viibryd  to 30 mg for 1 week, then 40 mg daily.  04/28/2020 appt with following noted: A lot better but far from where I need to be. Still depressed. Can't sleep.  Catnap.  Taking viibryd  in morning 40 mg for 5 weeks. Increase Viibryd  to 40 mg daily.   3-4 hours sleep and no naps.  Not drowsy daytime. Ongoing tiredness chronically. Feels more tense and irritable from not sleeping.  Plan: continue Viibryd  40  06/17/2020 appt with following noted: vIIBRYD  not helping. Trazodone  25 mg hallucinated bugs and screamed. H not doing well and that hasn't helped mood. Body aches and hurts all over. Asks about return to Cymbalta . Still gaining weight. Broken sleep with total of 6-8 hours Plan: CO poor effect with Viibryd  and duloxetine  did help FM more and pain. Wean Viibryd  and restart duloxetine  and increase to 90 mg daily (*took 120 mg before) Reduce Viibryd  to 20 mg daily and start 1 of the duloxetine  30 mg daily for 5 days, Then continue Viibryd  20 mg daily and increase duloxetine  to 2 of the 30 mg daily for 5 days, Then reduce Viibryd  to 10 mg daily and increase duloxetine  to 3 of the 30 mg daily for 5 days, Then stop Viibryd  and continue duloxetine  3 daily. Rec trial mirtazapine  7.5 mg HS.  08/12/2020 appointment with the following noted: I'm doing better with Cymbalta  90. much, much better Mirtazapine  didn't work for [employer] but is sleeping now.  Laid down at  MN and to sleep 2 A and up 845. Arthritis is bad and dx gout.  Joints in hands are bad.  Anxiety manageable. Depression got really bad before the switch in meds.  Pleased with result.  Seasonal depression starts in fall and until spring. Busy and worked with flowers. Pretty good now. Plan no med changes.  Continue duloxetine  90 mg daily and mirtazapine  for sleep  12/15/2020 appointment with the following noted: I'm doing really good.  Cymbalta   is a success.  She's not worried about winter.   Reeves has not been well with cellulitis.  She's been through health problems.  Handles stressors well.   CO still poor sleep even with mirtazapine .  Will have 2-3 nights ok and other nights EFA.  Wants to sleep 11-8.  Seems she can't get relaxed and will worry at night.  Some nights pain interferes. Plan: Better with switch back to duloxetine  90 mg daily (*took 120 mg before) Okay trial of Lunesta  2 mg nightly with mirtazapine  7.5 mg nightly for chronic insomnia.  03/16/2021 appointment with the following noted: Wonderful cp to last year was depressed.  Pleased with duloxetine . May go 2-3 nights with poor sleep per week. Hangover if use Lunesta  2 with mirtazapine  7.5.  So stopped mirtazapine .  Lunesta  works sometime but no SE with it. D positive for Covid and didn't visit for Xmas. Plan: DC mirtazapine  7.5 mg HS DT poor response and hangover Belsomra samples given  09/14/21 appt noted: Belsomra NR and NM and none now. Can be startled when she awakens at times and did with Belsomra. Trouble getting to sleep then awakens with pain. Some nights almost no sleep Energy better with change in BP meds. Couple of falls since here. No depression despite health issues. Plan: Continue duloxetine  90 mg daily and Lunesta  2 mg nightly as needed insomnia  03/30/22 appt noted: Unfortunately H Chester fell and broke back and hasn't been able to come home yet.  Been at 3 different places.  He's in good spirits.   At [facility] living. He's 84 yo.  Was active until he fell at home.  He can't walk and is incontinet.  His mind is clear. She's surprised at how well she has held up with the stress.  No sig seasonal depression so far.   Never been a person that sleeps well at night. Stress.    01/17/23 appt noted: Psych meds: duloxetine  90, Lunesta  2 mg HS Balance px intermittent with tendency to go to R side.   H chester still dealing with back px.  In 4 facilites since fall a year ago.  Into skilled nursing care now.  Can't walk and incontinent.  In hospital with rare infection.  So upset over [place] 84 yo and health problems. FM and chronic insomnia wearing her out.   When out of Lunesta  sleep is even worse, but still some nights no sleep.  Chronic leg cramps.  Chronic insomnia.   D and GS don't live near.  No one to help her.   FM pain is almost constant now.  Used to be episodic.   Don't want to stop duloxetine .  Some $ stress .    01/16/24 appt noted:  Psych meds: duloxetine  90, Lunesta  2 mg HS NO SHOW  01/28/24 appt noted:  Psych meds: duloxetine  90, NO Lunesta  2 mg HS, PCP rx mirtazapine  7.5 mg HS Sleep still a problem and mirtazapine  didn't help.  Trouble goin gto sleep and staying asleep.  Worse FM with stress.  Some nights no sleep.  But avg 3-4 hours nightly.  Wearing me out. Hip pain comes and goes.   No SE.  Including none with mirtazapine .  Stress $ a little better with H at [facility] but not gone.  Would like to be living with H but can't.   Plan: DC mirtazapine   Trial quetiapine  25 mg HS off label for sleep and anxiety.    04/01/24 appt noted:  Psych  meds: duloxetine  90, quetiapine  25 mg HS Not a magic pill, with quetiapine  with ongoing initial insomnia. No SE CC sleep.  Thinks only 3-4 hours last night Mood is ok.    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   Past Psychiatric Medication Trials: Rozerem,  Trazodone  severe SE, mirtazapine  7.5  hangover,  Lunesta  2 , zolpidem Belsomra NM  Duloxetine  120, Paxil 20 for years, Sertraline, fluoxetine, Wellbutrin,   Trintellix  20 5 weeks NR.  Viibryd  40  NR,  Low dose nortriptyline, Hx gabapentin  300 and had amnesia  Abilify  weight gain and loss benefit, Was More talkative on the Abilify  and the Wellbutrin.  lithium 2004 SE tremor at 600mg  daily.   Vraylar 40 NR, Buspar  NR.  Review of Systems:  Review of Systems  Constitutional:  Positive for fatigue. Negative for appetite change and unexpected weight change.  HENT:  Negative for sinus pain.   Cardiovascular:  Negative for palpitations.  Gastrointestinal:  Negative for diarrhea.  Musculoskeletal:  Positive for arthralgias, back pain, joint swelling and myalgias. Negative for neck stiffness.  Neurological:  Negative for tremors, weakness and numbness.  Psychiatric/Behavioral:  Positive for dysphoric mood and sleep disturbance. Negative for agitation, behavioral problems, confusion, decreased concentration, hallucinations, self-injury and suicidal ideas. The patient is nervous/anxious. The patient is not hyperactive.    Hx anemia.    Cardiologist said she had a stiff heart.  History of pulmonary embolism and on Coumadin .  Medications: I have reviewed the patient's current medications.  Current Outpatient Medications  Medication Sig Dispense Refill   ACCU-CHEK GUIDE test strip CHECK BLOOD SUGARS DAILY 100 strip 12   Accu-Chek Softclix Lancets lancets USE AS INSTRUCTED TO TEST SUGARS TWICE A DAY 100 each 12   amLODipine  (NORVASC ) 10 MG tablet TAKE 1 TABLET BY MOUTH EVERY DAY 30 tablet 0   Biotin 1000 MCG tablet Take 1,000 mcg by mouth daily.     Blood Glucose Monitoring Suppl (ACCU-CHEK GUIDE) w/Device KIT 1 each by Does not apply route 2 (two) times daily. To test sugars. Dx. E11.9 1 kit 1   dapagliflozin  propanediol (FARXIGA ) 5 MG TABS tablet Take 1 tablet (5 mg total) by mouth daily. 30 tablet 3   DULoxetine  (CYMBALTA ) 30 MG  capsule Take 3 capsules (90 mg total) by mouth daily. 270 capsule 0   fenofibrate (TRICOR) 48 MG tablet Take 48 mg by mouth daily.     FLAREX 0.1 % ophthalmic suspension Apply to eye.     furosemide  (LASIX ) 20 MG tablet TAKE 1 TABLET BY MOUTH EVERY DAY AS NEEDED 90 tablet 2   labetalol  (NORMODYNE ) 200 MG tablet Take 1 tablet (200 mg total) by mouth 2 (two) times daily. 180 tablet 1   levothyroxine  (SYNTHROID ) 25 MCG tablet TAKE 1 TABLET BY MOUTH EVERY DAY BEFORE BREAKFAST 90 tablet 3   Magnesium  500 MG TABS Take 500 mg by mouth daily.     mupirocin  ointment (BACTROBAN ) 2 % Apply 1 Application topically 2 (two) times daily. 22 g 3   QUEtiapine  (SEROQUEL ) 25 MG tablet TAKE 1 TABLET BY MOUTH EVERYDAY AT BEDTIME 90 tablet 0   rosuvastatin  (CRESTOR ) 10 MG tablet TAKE 1 TABLET BY MOUTH EVERY DAY 90 tablet 1   triamcinolone  cream (KENALOG ) 0.1 % Apply 1 Application topically daily as needed. To heel BID as needed 45 g 1   triamcinolone  ointment (KENALOG ) 0.1 % Apply 1 Application topically 2 (two) times daily. To affected areas (Back) 60 g 3  valsartan  (DIOVAN ) 320 MG tablet TAKE 1 TABLET BY MOUTH EVERY DAY 90 tablet 2   warfarin (COUMADIN ) 5 MG tablet TAKE 1 (5 MG) TABLET DAILY BY MOUTH EXCEPT TAKE 1/2 TABLET (2.5 MG) ON FRIDAY, SATURDAY, AND SUNDAYS OR AS DIRECTED BY ANTICOAGULATION CLINIC. 75 tablet 1   hydrALAZINE  (APRESOLINE ) 50 MG tablet Take 1 tablet (50 mg total) by mouth 2 (two) times daily. 180 tablet 1   No current facility-administered medications for this visit.    Medication Side Effects: None talkative more the [facility]  Allergies:  Allergies  Allergen Reactions   Lipitor [Atorvastatin]     Unknown   Morphine  Nausea And Vomiting   Meperidine Nausea And Vomiting   Naproxen Sodium Nausea And Vomiting    Past Medical History:  Diagnosis Date   Abnormal gait 12/10/2020   Acute venous embolism and thrombosis of deep vessels of proximal lower extremity (HCC) 12/04/2022   Anemia  associated with stage 3 chronic renal failure (HCC) 05/15/2022   Anxiety    Breast cancer (HCC)    Chest pain of uncertain etiology 12/25/2018   Chronic osteoarthritis    CKD stage 3b, GFR 30-44 ml/min (HCC) 01/25/2021   Dehydration 09/16/2020   Depression    Diabetes mellitus type II    Diabetes type 2, controlled (HCC) 08/27/2008   Qualifier: Diagnosis of   By: Norleen MD, Lynwood ORN        Elevated lipase 09/16/2020   ESOPHAGEAL STRICTURE 03/08/2007   Qualifier: Diagnosis of   By: Joshua RN, CGRN, Sheri         Essential hypertension 03/20/2007   Qualifier: Diagnosis of   By: Joshua RN, CGRN, Sheri         Exertional dyspnea 11/09/2008   11/20/2017  Walked RA x 3 laps @ 185 ft each stopped due to  End of study, fast pace, no desat, min sob   - Echo 12/05/2017 - LVEF 60-65%, moderate LVH, normal wall motion, grade 1 DD,   indeterminate LV filling pressure, normal GLS at -21%, normal LA   size, trivial TR, RVSP 20 mmHg, normal IVC.           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 Corey 12/12/2023   Squamous cell carcinoma in situ 01/09/2024   left lower leg- anteiror- tx Mohs Dr. Corey 02/06/2024   Squamous cell carcinoma of skin 10/16/2023   Left Anterior Neck- tx Mohs with Dr. Corey 11/14/2023   Squamous cell carcinoma of skin 10/16/2023   right lower leg-anterior Mohs Appt with Dr. Corey 11/28/2023    Family History  Problem Relation Age of Onset   Prostate cancer Maternal Uncle    Hypertension Maternal Grandfather    Breast cancer Cousin    Breast cancer Cousin 65   Depression Other        Parent    Arthritis Other        Parent, Grandparent   Hypertension Other        Grandparent   Hyperlipidemia Other  [facility]   Miscarriages / Stillbirths Other        Grandparent   Stroke Other        [facility]    Social History   Socioeconomic History   Marital status: Married    Spouse name: Not on file   Number of children: 1   Years of education: Not on file   Highest education level: Not on file  Occupational History   Occupation: Software Engineer: RETIRED   Occupation: producer, television/film/video   Occupation: school bus driver  Tobacco Use   Smoking status: Never    Passive exposure: Never   Smokeless tobacco: Never  Vaping Use   Vaping status: Never Used  Substance and Sexual Activity   Alcohol  use: No    Alcohol /week: 0.0 standard drinks of alcohol    Drug use: No   Sexual activity: Not Currently  Other Topics Concern   Not on file  Social History Narrative   Not on file   Social Drivers of Health   Tobacco Use: Low Risk (04/01/2024)   Patient History    Smoking Tobacco Use: Never    Smokeless Tobacco Use: Never    Passive Exposure: Never  Financial Resource Strain: Low Risk (07/02/2023)   Overall Financial Resource Strain (CARDIA)    Difficulty of Paying Living Expenses: Not hard at all  Food Insecurity: No Food Insecurity (07/02/2023)   Hunger Vital Sign    Worried About Running Out of Food in the Last Year: Never true    Ran Out of Food in the Last Year: Never true  Transportation Needs: No Transportation Needs (07/02/2023)   PRAPARE - Administrator, Civil Service (Medical): No    Lack of Transportation (Non-Medical): No  Physical Activity: Inactive (07/02/2023)   Exercise Vital Sign    Days of Exercise per Week: 0 days    Minutes of Exercise per Session: 0 min  Stress: No Stress Concern Present (07/02/2023)   [employer] of Occupational Health - Occupational Stress Questionnaire    Feeling of Stress : Not at all  Social Connections:  Moderately Isolated (07/02/2023)   Social Connection and Isolation Panel    Frequency of Communication with Friends and Family: More than three times a week    Frequency of Social Gatherings with Friends and Family: Twice a week    Attends Religious Services: Never    Database Administrator or Organizations: No    Attends Banker Meetings: Never    Marital Status: Married  Catering Manager Violence: Not At Risk (07/02/2023)   Humiliation, Afraid, Rape, and Kick questionnaire    Fear of Current or Ex-Partner: No    Emotionally Abused: No    Physically Abused: No    Sexually Abused: No  Depression (PHQ2-9): High Risk (01/30/2024)   Depression (PHQ2-9)    PHQ-2 Score: 13  Alcohol  Screen: Low Risk (07/02/2023)   Alcohol  Screen    Last Alcohol  Screening Score (AUDIT): 0  Housing: Low Risk (07/02/2023)   Housing Stability Vital Sign    Unable to Pay for Housing in the Last Year: No    Number of Times Moved in the Last Year: 0    Homeless in the Last Year: No  Utilities: Not At Risk (07/02/2023)   AHC Utilities    Threatened with loss of utilities: No  Health Literacy: Adequate Health Literacy (07/02/2023)   B1300 Health Literacy    Frequency of need for help with medical  instructions: Never    Past Medical History, Surgical history, Social history, and Family history were reviewed and updated as appropriate.   Please see review of systems for further details on the patient's review from today.   Objective:   Physical Exam:  There were no vitals taken for this visit.  Physical Exam Constitutional:      General: She is not in acute distress.    Appearance: She is well-developed.  Musculoskeletal:        General: No deformity.  Neurological:     Mental Status: She is alert and oriented to person, place, and time.     Cranial Nerves: No dysarthria.     Coordination: Coordination normal.  Psychiatric:        Attention and Perception: Attention normal. She is  attentive.        Mood and Affect: Mood is anxious. Mood is not depressed. Affect is not labile, blunt, angry, tearful or inappropriate.        Speech: Speech normal. Speech is not rapid and pressured.        Behavior: Behavior normal. Behavior is cooperative.        Thought Content: Thought content normal. Thought content is not paranoid or delusional. Thought content does not include homicidal or suicidal ideation. Thought content does not include suicidal plan.        Cognition and Memory: Cognition and memory normal.        Judgment: Judgment normal.     Comments: Insight fair-good.   Depression is OK so far.  Pleasant.   Talkative chronically      Lab Review:     Component Value Date/Time   NA 140 02/12/2024 1439   NA 141 08/01/2016 1527   K 4.4 02/12/2024 1439   K 4.6 08/01/2016 1527   CL 102 02/12/2024 1439   CO2 19 (L) 02/12/2024 1439   CO2 24 08/01/2016 1527   GLUCOSE 119 (H) 02/12/2024 1439   GLUCOSE 101 (H) 06/24/2023 2241   GLUCOSE 93 08/01/2016 1527   BUN 30 (H) 02/12/2024 1439   BUN 19.9 08/01/2016 1527   CREATININE 1.30 (H) 02/12/2024 1439   CREATININE 0.9 08/01/2016 1527   CALCIUM  9.2 02/12/2024 1439   CALCIUM  9.7 08/01/2016 1527   PROT 6.4 07/23/2023 1437   PROT 7.2 08/01/2016 1527   ALBUMIN 4.3 07/23/2023 1437   ALBUMIN 4.0 08/01/2016 1527   AST 25 07/23/2023 1437   AST 41 (H) 08/01/2016 1527   ALT 14 07/23/2023 1437   ALT 25 08/01/2016 1527   ALKPHOS 52 07/23/2023 1437   ALKPHOS 48 08/01/2016 1527   BILITOT 0.4 07/23/2023 1437   BILITOT 0.33 08/01/2016 1527   GFRNONAA 41 (L) 06/24/2023 2241   GFRAA 76 05/28/2019 1609       Component Value Date/Time   WBC 6.3 01/30/2024 1458   WBC 9.6 06/24/2023 2241   RBC 3.57 (L) 01/30/2024 1458   RBC 3.58 (L) 06/24/2023 2241   HGB 11.4 01/30/2024 1458   HGB 9.5 (L) 08/01/2016 1527   HCT 33.9 (L) 01/30/2024 1458   HCT 31.2 (L) 08/01/2016 1527   PLT 213 01/30/2024 1458   MCV 95 01/30/2024 1458   MCV 83.9  08/01/2016 1527   MCH 31.9 01/30/2024 1458   MCH 31.6 06/24/2023 2241   MCHC 33.6 01/30/2024 1458   MCHC 34.3 06/24/2023 2241   RDW 12.5 01/30/2024 1458   RDW 17.2 (H) 08/01/2016 1527   LYMPHSABS 1.2 01/30/2024 1458  LYMPHSABS 2.0 08/01/2016 1527   MONOABS 1.0 06/24/2023 2241   MONOABS 0.4 08/01/2016 1527   EOSABS 0.1 01/30/2024 1458   BASOSABS 0.0 01/30/2024 1458   BASOSABS 0.0 08/01/2016 1527    No results found for: POCLITH, LITHIUM   No results found for: PHENYTOIN, PHENOBARB, VALPROATE, CBMZ   Endo checked thyroid  lately.   Vitamin D  level on April 01, 2018 reported as 40.  After that vitamin D  50,000 units weekly was prescribed with a goal of achieving levels in the 50s and 60s.  December 04, 2018 vitamin D  25 off supplement .res Assessment: Plan:    Syla was seen today for follow-up, depression, anxiety and sleeping problem.  Diagnoses and all orders for this visit:  Major depressive disorder, recurrent episode, moderate (HCC)  Insomnia due to mental condition  Delayed sleep phase syndrome  Panic disorder with agoraphobia  Generalized anxiety disorder  Fibromyalgia    30 min face to face time with patient Hx.TRD better again with duloxetine  again.  We discussed options for dealing with this. If needed consider lamotrigine.  Pt hx mixed sx so consider unconventional alternatives.  She does not appear manic and not sig different from the last visit. Still talkative on it but not manic.  Better with switch back to duloxetine  90 mg daily (*took 120 mg before) Consisder increase.    Educated that she cannot expect to sleep solid through the night.  Explained.   Also disc sleep hygiene.   She's frustrated with sleep.    Disc fall risk with sleep meds.  Have tried the safest meds. She has chronic insomnia. Still not sleeping very well.  And maybe even worse.  Increase quetiapine  50-100 mg HS off label for sleep and anxiety both of which  are TR 2-4 tablets of quetiapine  for sleep and let us  know what works. Disc fall risk and time frame of effect.  Disc SE including risk falls.    Supportive therapy dealing with duloxetine .  Repeat vitamin D  level was 47.  Previously high.  Restarted 2000 units daily.      FU 2 mos  Lorene Macintosh, MD, DFAPA   Please see After Visit Summary for patient specific instructions.  Future Appointments  Date Time Provider Department Center  04/08/2024  2:30 PM Eldonna Novel, MD [facility] None  04/14/2024  9:15 AM Corey Rufus HERO, MD [facility] None  04/16/2024  1:15 PM Georgina Ozell LABOR, MD [facility] None  04/16/2024  2:15 PM [facility] COUMADIN  CLINIC 2 [facility] H&V  07/30/2024  1:30 PM Caudle, Thersia Bitters, FNP [facility] [phone] [facility]    No orders of the defined types were placed in this encounter.      -------------------------------

## 2024-04-01 NOTE — Patient Instructions (Signed)
 2-4 tablets of quetiapine  for sleep and let us  know what works.

## 2024-04-01 NOTE — Telephone Encounter (Signed)
 Copied from CRM [id number]. Topic: Clinical - Medication Refill >> Apr 01, 2024  2:23 PM Rosaria E wrote: Medication: levothyroxine  (SYNTHROID ) 25 MCG tablet   Has the patient contacted their pharmacy? Yes (Agent: If no, request that the patient contact the pharmacy for the refill. If patient does not wish to contact the pharmacy document the reason why and proceed with request.) (Agent: If yes, when and what did the pharmacy advise?)  This is the patient's preferred pharmacy:  [id number] - [place], [place] - [address] [place] [address] Phone: [phone] Fax: [phone]  Is this the correct pharmacy for this prescription? Yes If no, delete pharmacy and type the correct one.   Has the prescription been filled recently? Yes  Is the patient out of the medication? No.   Has the patient been seen for an appointment in the last year OR does the patient have an upcoming appointment? Yes  Can we respond through MyChart? Yes  Agent: Please be advised that Rx refills may take up to 3 business days. We ask that you follow-up with your pharmacy.

## 2024-04-08 ENCOUNTER — Other Ambulatory Visit: Payer: Self-pay | Admitting: Cardiology

## 2024-04-08 ENCOUNTER — Other Ambulatory Visit: Payer: Self-pay

## 2024-04-08 ENCOUNTER — Ambulatory Visit: Admitting: Physical Medicine and Rehabilitation

## 2024-04-08 ENCOUNTER — Telehealth: Payer: Self-pay | Admitting: Physical Medicine and Rehabilitation

## 2024-04-08 DIAGNOSIS — M5416 Radiculopathy, lumbar region: Secondary | ICD-10-CM | POA: Diagnosis not present

## 2024-04-08 DIAGNOSIS — I10 Essential (primary) hypertension: Secondary | ICD-10-CM

## 2024-04-08 MED ORDER — METHYLPREDNISOLONE ACETATE 40 MG/ML IJ SUSP
40.0000 mg | Freq: Once | INTRAMUSCULAR | Status: AC
  Administered 2024-04-08: 40 mg

## 2024-04-08 NOTE — Telephone Encounter (Signed)
 Pt called asking if she should keep her apt with Dr. Georgina on 1/28 or change it because she will not have gone 4 wks since her last shot. She seems very confused about all this. Can you please call her and explain this to her, as she is confusing me because she is confused herself. Call back number is [phone].

## 2024-04-08 NOTE — Progress Notes (Signed)
 Pain Scale   Average Pain 4 Patient advising she has chronic lower back pain that is centralized pain is constant      +Driver, +BT, -Dye Allergies. (Coumadin )

## 2024-04-09 ENCOUNTER — Encounter (HOSPITAL_BASED_OUTPATIENT_CLINIC_OR_DEPARTMENT_OTHER): Payer: Self-pay | Admitting: Family Medicine

## 2024-04-09 ENCOUNTER — Ambulatory Visit (HOSPITAL_BASED_OUTPATIENT_CLINIC_OR_DEPARTMENT_OTHER): Admitting: Family Medicine

## 2024-04-09 VITALS — BP 143/77 | HR 67 | Ht 62.0 in | Wt 139.0 lb

## 2024-04-09 DIAGNOSIS — K2100 Gastro-esophageal reflux disease with esophagitis, without bleeding: Secondary | ICD-10-CM

## 2024-04-09 MED ORDER — OMEPRAZOLE 40 MG PO CPDR: 40.0000 mg | DELAYED_RELEASE_CAPSULE | Freq: Every day | ORAL | 0 refills | Status: AC

## 2024-04-09 MED ORDER — FAMOTIDINE 20 MG PO TABS: 20.0000 mg | ORAL_TABLET | Freq: Two times a day (BID) | ORAL | 0 refills | Status: AC

## 2024-04-09 NOTE — Patient Instructions (Signed)
 Famotidine : Take 1 tablet by mouth twice a day for 14 days.  Omeprazole  40mg : Take 1 tablet by mouth once daily prior to breakfast.

## 2024-04-09 NOTE — Progress Notes (Signed)
 "     Acute Care Office Visit  Subjective:   Andrea Simmons 1941-01-04 04/09/2024  Chief Complaint  Patient presents with   Sore Throat    Pt states 6 days ago she started having problems with a sore throat and did have some problems when swallowing food. States the pain has started improving.    HPI: Patient states her sore throat began approx 6 days ago and describes as aching. She states when she eats she has to eat slowly and has increased pain due to sore throat. She states within about 15-20 minutes she begins to belch frequently for several minutes. She denies nasal congestion, sinus pressure, headache, cough, nausea, vomiting, fever/chills, body aches. She reports hx of GERD in the past. Denies recent change in diet and monitors her sugar intake. Denies change in weight, bowel movements, fatigue.    The following portions of the patient's history were reviewed and updated as appropriate: past medical history, past surgical history, family history, social history, allergies, medications, and problem list.   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   Grade I diastolic dysfunction 04/04/2023   Snoring 11/13/2022   Tear film insufficiency 04/19/2021   Sjogren's disease 01/24/2021   Hereditary and idiopathic neuropathy, unspecified 12/10/2020   Osteoarthritis 12/10/2020   Esophageal dysphagia 09/16/2020   Vitamin D  deficiency 10/01/2019   Polypharmacy 10/01/2019   Major depressive disorder, recurrent episode, moderate (HCC) 10/01/2019   Hx of pulmonary embolus 03/30/2017   Hyperlipidemia 12/26/2013   Encounter for therapeutic drug monitoring 04/16/2013   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   Diabetes type 2, controlled (HCC) 08/27/2008   Seasonal  and perennial allergic rhinitis 08/27/2008   Lung nodule 08/27/2008   Fibromyalgia 08/27/2008   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 (HCC) 03/20/2007   Hypothyroidism 03/20/2007   Essential hypertension 03/20/2007   Recurrent pulmonary emboli (HCC) 03/20/2007   GERD 03/20/2007   Past Medical History:  Diagnosis Date   Abnormal gait 12/10/2020   Acute venous embolism and thrombosis of deep vessels of proximal lower extremity (HCC) 12/04/2022   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) 05/15/2022   Anxiety    Breast cancer (HCC)    Chest pain of uncertain etiology 12/25/2018   Chronic osteoarthritis    CKD stage 3b, GFR 30-44 ml/min (HCC) 01/25/2021   Dehydration 09/16/2020   Depression    Diabetes mellitus type II    Diabetes type 2, controlled (HCC) 08/27/2008   Qualifier: Diagnosis of   By: Norleen MD, Lynwood ORN        Elevated lipase 09/16/2020   ESOPHAGEAL STRICTURE 03/08/2007   Qualifier: Diagnosis of   By: Joshua RN, CGRN, Sheri         Essential hypertension 03/20/2007   Qualifier: Diagnosis of   By: Joshua RN, CGRN, Sheri         Exertional dyspnea 11/09/2008   11/20/2017  Walked RA x 3 laps @ 185 ft each stopped due to  End of study, fast pace, no desat, min sob   - Echo 12/05/2017 - LVEF 60-65%, moderate LVH, normal wall motion, grade 1 DD,   indeterminate LV filling pressure, normal GLS at -21%, normal LA   size, trivial TR, RVSP 20 mmHg, normal IVC.  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 Corey 02/06/2024   Squamous cell carcinoma of skin 10/16/2023   Left Anterior Neck- tx Mohs with Dr. Corey 11/14/2023   Squamous cell carcinoma of skin 10/16/2023   right lower leg-anterior Mohs Appt with Dr. Corey 11/28/2023   Past Surgical History:  Procedure Laterality Date   APPENDECTOMY     BALLOON DILATION N/A 10/18/2020   Procedure: MERRILL DILATION;  Surgeon: Cindie Carlin POUR, DO;  [facility];  Service: Endoscopy;  Laterality: N/A;   BIOPSY  10/18/2020   Procedure: BIOPSY;  Surgeon: Cindie Carlin POUR, DO;  [facility];  Service: Endoscopy;;  gastric    BREAST BIOPSY Left 1993   BREAST BIOPSY Right 09/25/2014   stero. Benign   BREAST RECONSTRUCTION Left    CATARACT EXTRACTION     ESOPHAGOGASTRODUODENOSCOPY (EGD) WITH PROPOFOL  N/A 10/18/2020   Procedure: ESOPHAGOGASTRODUODENOSCOPY (EGD) WITH PROPOFOL ;  Surgeon: Cindie Carlin POUR, DO;  Location: [facility];  Service: Endoscopy;  Laterality: N/A;  11:00am   MASTECTOMY Left    ROTATOR CUFF REPAIR     SPINAL FUSION      x 2   TOTAL ABDOMINAL HYSTERECTOMY W/ BILATERAL SALPINGOOPHORECTOMY     TUBAL LIGATION     Family History  Problem Relation Age of Onset   Prostate cancer Maternal Uncle    Hypertension Maternal Grandfather    Breast cancer Cousin    Breast cancer Cousin 47   Depression Other        Parent   Arthritis Other        Parent, Grandparent   Hypertension Other        Grandparent   Hyperlipidemia Other        FMH   Miscarriages / Stillbirths Other        Grandparent   Stroke Other        [facility]   Outpatient Medications Prior to Visit  Medication Sig Dispense Refill   ACCU-CHEK GUIDE test strip CHECK BLOOD SUGARS DAILY 100 strip 12   Accu-Chek Softclix Lancets lancets USE AS INSTRUCTED TO TEST SUGARS TWICE A DAY 100 each 12   amLODipine  (NORVASC ) 10 MG tablet TAKE 1 TABLET BY MOUTH EVERY DAY 90 tablet 1   Biotin 1000 MCG tablet Take 1,000 mcg by mouth daily.      Blood Glucose Monitoring Suppl (ACCU-CHEK GUIDE) w/Device KIT 1 each by Does not apply route 2 (two) times daily. To test sugars. Dx. E11.9 1 kit 1   dapagliflozin  propanediol (FARXIGA ) 5 MG TABS tablet Take 1 tablet (5 mg total) by mouth daily. 30 tablet 3   DULoxetine  (CYMBALTA ) 30 MG capsule Take 3 capsules (90 mg total) by mouth daily. 270 capsule 0   fenofibrate (TRICOR) 48 MG tablet Take 48 mg by mouth daily.     FLAREX 0.1 % ophthalmic suspension Apply to eye.     furosemide  (LASIX ) 20 MG  tablet TAKE 1 TABLET BY MOUTH EVERY DAY AS NEEDED 90 tablet 2   hydrALAZINE  (APRESOLINE ) 50 MG tablet Take 1 tablet (50 mg total) by mouth 2 (two) times daily. 180 tablet 1   labetalol  (NORMODYNE ) 200 MG tablet Take 1 tablet (200 mg total) by mouth 2 (two) times daily. 180 tablet 1   levothyroxine  (SYNTHROID ) 25 MCG tablet Take 1 tablet (25 mcg total) by mouth daily before breakfast. 90 tablet 1   Magnesium  500 MG TABS Take 500 mg by mouth daily.     mupirocin  ointment (BACTROBAN ) 2 % Apply 1 Application topically 2 (two) times daily. 22 g 3   QUEtiapine  (SEROQUEL ) 25 MG tablet TAKE 1 TABLET BY MOUTH EVERYDAY AT BEDTIME 90 tablet 0   rosuvastatin  (CRESTOR ) 10 MG tablet TAKE 1 TABLET BY MOUTH EVERY DAY 90 tablet 1   triamcinolone  cream (KENALOG ) 0.1 % Apply 1 Application topically daily as needed. To heel BID as needed 45 g 1   triamcinolone  ointment (KENALOG ) 0.1 % Apply 1 Application topically 2 (two) times daily. To affected areas (Back) 60 g 3   valsartan  (DIOVAN ) 320 MG tablet TAKE 1 TABLET BY MOUTH EVERY DAY 90 tablet 2   warfarin (COUMADIN ) 5 MG tablet TAKE 1 (5 MG) TABLET DAILY BY MOUTH EXCEPT TAKE 1/2 TABLET (2.5 MG) ON FRIDAY, SATURDAY, AND SUNDAYS OR AS DIRECTED BY ANTICOAGULATION CLINIC. 75 tablet 1   No facility-administered medications prior to visit.   Allergies[1]   ROS: A complete ROS was performed with pertinent positives/negatives noted in the HPI. The remainder of the ROS are  negative.    Objective:   Today's Vitals   04/09/24 1523  BP: (!) 143/77  Pulse: 67  SpO2: 96%  Weight: 139 lb (63 kg)  Height: 5' 2 (1.575 m)    GENERAL: Well-appearing, in NAD. Well nourished.  SKIN: Pink, warm and dry. No rash, lesion, ulceration, or ecchymoses.  Head: Normocephalic. NECK: Trachea midline. Full ROM w/o pain or tenderness. No lymphadenopathy.  EARS: Tympanic membranes are intact, translucent without bulging and without drainage. Appropriate landmarks visualized.  EYES: Conjunctiva clear without exudates. EOMI, PERRL, no drainage present.  NOSE: Septum midline w/o deformity. Nares patent, mucosa pink and non-inflamed w/o drainage. No sinus tenderness.  THROAT: Uvula midline. Oropharynx clear. Tonsils non-inflamed without exudate. Mucous membranes pink and moist.  RESPIRATORY: Chest wall symmetrical. Respirations even and non-labored. Breath sounds clear to auscultation bilaterally.  CARDIAC: S1, S2 present, regular rate and rhythm without murmur or gallops.  GI: Abdomen soft, non-tender. Normoactive bowel sounds. No rebound tenderness.  EXTREMITIES: Without clubbing, cyanosis, or edema.  NEUROLOGIC: No motor or sensory deficits. Steady, even gait. C2-C12 intact.  PSYCH/MENTAL STATUS: Alert, oriented x 3. Cooperative, appropriate mood and affect.      Assessment & Plan:  1. Gastroesophageal reflux disease with esophagitis, unspecified whether hemorrhage (Primary) Presenting with likely flare up of GERD. Discussed dietary changes and recommend starting Famotidine  20mg  BID x 14 days and Omeprazole  40mg  daily x 30 days. Discussed to follow up in 1-2 weeks if no improvement or worsening. Pt verbalized understanding.    Meds ordered this encounter  Medications   famotidine  (PEPCID ) 20 MG tablet    Sig: Take 1 tablet (20 mg total) by mouth 2 (two) times daily.    Dispense:  28 tablet    Refill:  0    Supervising Provider:   DE CUBA, RAYMOND J [[id number]]    omeprazole  (PRILOSEC) 40 MG capsule  Sig: Take 1 capsule (40 mg total) by mouth daily.    Dispense:  30 capsule    Refill:  0    Supervising Provider:   DE CUBA, RAYMOND J [[id number]]   Lab Orders  No laboratory test(s) ordered today   Return if symptoms worsen or fail to improve.    Patient to reach out to office if new, worrisome, or unresolved symptoms arise or if no improvement in patient's condition. Patient verbalized understanding and is agreeable to treatment plan. All questions answered to patient's satisfaction.    Andrea Bovenzi Olivia Spenser Cong, FNP      [1]  Allergies Allergen Reactions   Lipitor [Atorvastatin]     Unknown   Morphine  Nausea And Vomiting   Meperidine Nausea And Vomiting   Naproxen Sodium Nausea And Vomiting   "

## 2024-04-10 ENCOUNTER — Other Ambulatory Visit: Payer: Self-pay | Admitting: Psychiatry

## 2024-04-10 ENCOUNTER — Telehealth: Payer: Self-pay | Admitting: Psychiatry

## 2024-04-10 ENCOUNTER — Encounter: Payer: Self-pay | Admitting: Dermatology

## 2024-04-10 ENCOUNTER — Ambulatory Visit (INDEPENDENT_AMBULATORY_CARE_PROVIDER_SITE_OTHER): Admitting: Dermatology

## 2024-04-10 VITALS — BP 139/71 | HR 55

## 2024-04-10 DIAGNOSIS — Z85828 Personal history of other malignant neoplasm of skin: Secondary | ICD-10-CM | POA: Diagnosis not present

## 2024-04-10 DIAGNOSIS — G4721 Circadian rhythm sleep disorder, delayed sleep phase type: Secondary | ICD-10-CM

## 2024-04-10 DIAGNOSIS — F5105 Insomnia due to other mental disorder: Secondary | ICD-10-CM

## 2024-04-10 DIAGNOSIS — C4492 Squamous cell carcinoma of skin, unspecified: Secondary | ICD-10-CM

## 2024-04-10 DIAGNOSIS — L905 Scar conditions and fibrosis of skin: Secondary | ICD-10-CM | POA: Diagnosis not present

## 2024-04-10 DIAGNOSIS — L821 Other seborrheic keratosis: Secondary | ICD-10-CM

## 2024-04-10 DIAGNOSIS — F331 Major depressive disorder, recurrent, moderate: Secondary | ICD-10-CM

## 2024-04-10 DIAGNOSIS — T1490XD Injury, unspecified, subsequent encounter: Secondary | ICD-10-CM

## 2024-04-10 MED ORDER — QUETIAPINE FUMARATE 25 MG PO TABS: 75.0000 mg | ORAL_TABLET | Freq: Every day | ORAL | 0 refills | Status: AC

## 2024-04-10 NOTE — Telephone Encounter (Signed)
 Andrea Simmons says she is doing better with her sleep, resting better at night. She would like to know if she can get quetiapine  75, and take 1 pill.   Last visit: 1/13 Increase quetiapine  50-100 mg HS off label for sleep and anxiety both of which are TR 2-4 tablets of quetiapine  for sleep and let us  know what works.  Let me know how to proceed, I do not believe this medication comes in a 75 mg dose.  FU 3/18

## 2024-04-10 NOTE — Patient Instructions (Signed)

## 2024-04-10 NOTE — Telephone Encounter (Signed)
 Next visit is 06/04/24. Requesting a refill on Quetiapine  25 mg called to:   [id number] - [place], [place] - [address]   Phone: [phone]  Fax: [phone]    Shaquandra is doing better with her sleep and resting much better at night. Can she possibly get Quetiapine  75 mg and take just one pill?

## 2024-04-10 NOTE — Telephone Encounter (Signed)
 They don't make a 75 mg pill but I can send RX 25 mg tablets, 3 nightly

## 2024-04-10 NOTE — Progress Notes (Signed)
 "  Follow Up Visit   History of Present Illness Andrea Simmons is an 84 year old female who presents with concerns about brown spots and a persistent leg wound.  She has brown spots on her wrist and under her eyes. The spot on her wrist feels like 'something's in it' but does not cause any discomfort. She recalls being told previously that these spots are benign.  She also has a persistent wound on her leg that has not completely healed. She applies Vaseline to the area and notes a hard area, possibly due to scarring. She has difficulty keeping the scab off.  During the review of symptoms, she mentions a red spot that appears to be spreading, but she is not ready to pursue treatment for it at this time.  The patient presents for follow up from Mohs surgery for a Squamous Carcinoma in Situ on the left lower leg anterior  treated on 02/06/24, repaired with a linear closure. The patient has been bandaging the wound as directed. The endorse the following concerns: none  The following portions of the chart were reviewed this encounter and updated as appropriate: medications, allergies, medical history  Review of Systems:  No other skin or systemic complaints except as noted in HPI or Assessment and Plan.  Objective  Well appearing patient in no apparent distress; mood and affect are within normal limits.  A focal examination was performed including left leg and  All findings within normal limits unless otherwise noted below.  Healing wound with mild erythema  Relevant physical exam findings are noted in the Assessment and Plan.    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 - Continue application of Vaseline to the affected area. - Apply a small bandage over the area. - Reassess in two months.  Seborrheic keratosis on wrist and under eye Benign skin thickenings present on  the wrist and under the eye. - Consider cryotherapy for seborrheic keratosis if desired in the future, noting potential blistering. - Monitor for any changes or symptoms.   Healing Wound s/p Mohs for Squamous Carcinoma in Situ on the left lower leg anterior  treated on 02/06/24, repaired with a linear closure - Reassured that wound is healing well - No evidence of infection - No swelling, induration, purulence, dehiscence, or tenderness out of proportion to the clinical exam, see photo above - Discussed that scars take up to 12 months to mature from the date of surgery - Recommend SPF 30+ to scar daily to prevent purple color from UV exposure during scar maturation process - Discussed that erythema and raised appearance of scar will fade over the next 4-6 months - OK to start scar massage at 4-6 weeks post-op - Can consider silicone based products for scar healing starting at 6 weeks post-op  HISTORY OF SQUAMOUS CELL CARCINOMA OF THE SKIN - No evidence of recurrence today - Recommend regular full body skin exams - Recommend daily broad spectrum sunscreen SPF 30+ to sun-exposed areas, reapply every 2 hours as needed.  - Call if any new or changing lesions are noted between office visits     Return in about 2 months (around 06/08/2024) for spot check on face.  I, Doyce Pan, CMA, am acting as scribe for RUFUS CHRISTELLA HOLY, MD.   Documentation: I have reviewed the above documentation for accuracy and completeness, and I agree with the  above.  RUFUS CHRISTELLA HOLY, MD  "

## 2024-04-11 NOTE — Telephone Encounter (Signed)
 Called Home phone twice and it just rings, cannot leave message. Will try again later.

## 2024-04-14 ENCOUNTER — Ambulatory Visit: Admitting: Dermatology

## 2024-04-15 NOTE — Procedures (Signed)
 Lumbosacral Transforaminal Epidural Steroid Injection - Sub-Pedicular Approach with Fluoroscopic Guidance  Patient: Andrea Simmons      Date of Birth: 22-Jul-1940 MRN: [id number] PCP: Knute Thersia Bitters, FNP      Visit Date: 04/08/2024   Universal Protocol:    Date/Time: 04/08/2024  Consent Given By: the patient  Position: PRONE  Additional Comments: Vital signs were monitored before and after the procedure. Patient was prepped and draped in the usual sterile fashion. The correct patient, procedure, and site was verified.   Injection Procedure Details:   Procedure diagnoses: Lumbar radiculopathy [M54.16]    Meds Administered:  Meds ordered this encounter  Medications   methylPREDNISolone  acetate (DEPO-MEDROL ) injection 40 mg    Laterality: Left  Location/Site: L4  Needle:5.0 in., 22 ga.  Short bevel or Quincke spinal needle  Needle Placement: Transforaminal  Findings:    -Comments: Excellent flow of contrast along the nerve, nerve root and into the epidural space.  Procedure Details: After squaring off the end-plates to get a true AP view, the C-arm was positioned so that an oblique view of the foramen as noted above was visualized. The target area is just inferior to the nose of the scotty dog or sub pedicular. The soft tissues overlying this structure were infiltrated with 2-3 ml. of 1% Lidocaine  without Epinephrine.  The spinal needle was inserted toward the target using a trajectory view along the fluoroscope beam.  Under AP and lateral visualization, the needle was advanced so it did not puncture dura and was located close the 6 O'Clock position of the pedical in AP tracterory. Biplanar projections were used to confirm position. Aspiration was confirmed to be negative for CSF and/or blood. A 1-2 ml. volume of Isovue-250 was injected and flow of contrast was noted at each level. Radiographs were obtained for documentation purposes.   After attaining the  desired flow of contrast documented above, a 0.5 to 1.0 ml test dose of 0.25% Marcaine  was injected into each respective transforaminal space.  The patient was observed for 90 seconds post injection.  After no sensory deficits were reported, and normal lower extremity motor function was noted,   the above injectate was administered so that equal amounts of the injectate were placed at each foramen (level) into the transforaminal epidural space.   Additional Comments:  The patient tolerated the procedure well Dressing: 2 x 2 sterile gauze and Band-Aid    Post-procedure details: Patient was observed during the procedure. Post-procedure instructions were reviewed.  Patient left the clinic in stable condition.

## 2024-04-15 NOTE — Progress Notes (Signed)
 "  Andrea Simmons - 84 y.o. female MRN [id number]  Date of birth: 06-20-1940  Office Visit Note: Visit Date: 04/08/2024 PCP: Knute Thersia Bitters, FNP Referred by: Knute Thersia Bitters, FNP  Subjective: Chief Complaint  Patient presents with   Lower Back - Pain   HPI:  [name] is a 84 y.o. female who comes in today at the request of Duwaine Pouch, FNP for planned Left L4-5 Lumbar Transforaminal epidural steroid injection with fluoroscopic guidance.  The patient has failed conservative care including home exercise, medications, time and activity modification.  This injection will be diagnostic and hopefully therapeutic.  Please see requesting physician notes for further details and justification. Consider facet block.   ROS Otherwise per HPI.  Assessment & Plan: Visit Diagnoses:    ICD-10-CM   1. Lumbar radiculopathy  M54.16 XR C-ARM NO REPORT    Epidural Steroid injection    methylPREDNISolone  acetate (DEPO-MEDROL ) injection 40 mg      Plan: No additional findings.   Meds & Orders:  Meds ordered this encounter  Medications   methylPREDNISolone  acetate (DEPO-MEDROL ) injection 40 mg    Orders Placed This Encounter  Procedures   XR C-ARM NO REPORT   Epidural Steroid injection    Follow-up: Return for visit to requesting provider as needed.   Procedures: No procedures performed  Lumbosacral Transforaminal Epidural Steroid Injection - Sub-Pedicular Approach with Fluoroscopic Guidance  Patient: Andrea Simmons      Date of Birth: [birth year] MRN: [id number] PCP: Knute Thersia Bitters, FNP      Visit Date: 04/08/2024   Universal Protocol:    Date/Time: 04/08/2024  Consent Given By: the patient  Position: PRONE  Additional Comments: Vital signs were monitored before and after the procedure. Patient was prepped and draped in the usual sterile fashion. The correct patient, procedure, and site was verified.   Injection Procedure Details:   Procedure  diagnoses: Lumbar radiculopathy [M54.16]    Meds Administered:  Meds ordered this encounter  Medications   methylPREDNISolone  acetate (DEPO-MEDROL ) injection 40 mg    Laterality: Left  Location/Site: L4  Needle:5.0 in., 22 ga.  Short bevel or Quincke spinal needle  Needle Placement: Transforaminal  Findings:    -Comments: Excellent flow of contrast along the nerve, nerve root and into the epidural space.  Procedure Details: After squaring off the end-plates to get a true AP view, the C-arm was positioned so that an oblique view of the foramen as noted above was visualized. The target area is just inferior to the nose of the scotty dog or sub pedicular. The soft tissues overlying this structure were infiltrated with 2-3 ml. of 1% Lidocaine  without Epinephrine.  The spinal needle was inserted toward the target using a trajectory view along the fluoroscope beam.  Under AP and lateral visualization, the needle was advanced so it did not puncture dura and was located close the 6 O'Clock position of the pedical in AP tracterory. Biplanar projections were used to confirm position. Aspiration was confirmed to be negative for CSF and/or blood. A 1-2 ml. volume of Isovue-250 was injected and flow of contrast was noted at each level. Radiographs were obtained for documentation purposes.   After attaining the desired flow of contrast documented above, a 0.5 to 1.0 ml test dose of 0.25% Marcaine  was injected into each respective transforaminal space.  The patient was observed for 90 seconds post injection.  After no sensory deficits were reported, and normal lower extremity motor function was noted,  the above injectate was administered so that equal amounts of the injectate were placed at each foramen (level) into the transforaminal epidural space.   Additional Comments:  The patient tolerated the procedure well Dressing: 2 x 2 sterile gauze and Band-Aid    Post-procedure details: Patient  was observed during the procedure. Post-procedure instructions were reviewed.  Patient left the clinic in stable condition.    Clinical History: MRI LUMBAR SPINE WITHOUT CONTRAST   TECHNIQUE: Multiplanar, multisequence MR imaging of the lumbar spine was performed. No intravenous contrast was administered.   COMPARISON:  Prior study from 02/25/2019.   FINDINGS: Segmentation: Standard. Lowest well-formed disc space labeled the L5-S1 level.   Alignment: 6 mm facet mediated anterolisthesis of L4 on L5. Trace 2 mm retrolisthesis of T12 on L1 and L1 and L2.   Vertebrae: Compression deformity of the superior endplate of T11 with superimposed endplate Schmorl's node deformity, partially visualized. There is mild marrow edema at the anterior aspect of the partially visualized superior endplate, which could reflect a superimposed acute to subacute component (series 4, image 8). No retropulsion. Vertebral body height otherwise maintained with no other acute or chronic fracture. Bone marrow signal intensity within normal limits. No worrisome osseous lesions. No other abnormal marrow edema.   Conus medullaris and cauda equina: Conus extends to the T12 level. Conus and cauda equina appear normal.   Paraspinal and other soft tissues: Unremarkable.   Disc levels:   T12-L1: Retrolisthesis with mild disc bulge and endplate spurring. Mild facet hypertrophy. No canal or foraminal stenosis.   L1-2: Retrolisthesis with mild disc bulge and endplate spurring. Mild facet hypertrophy. No spinal stenosis. Foramina remain patent.   L2-3: Disc desiccation with minimal disc bulge. Mild to moderate facet and ligament flavum hypertrophy. No significant spinal stenosis. Foramina remain patent.   L3-4: Mild degenerative intervertebral disc space narrowing. Disc desiccation with mild disc bulge. Moderate bilateral facet hypertrophy. No significant spinal stenosis. Foramina remain patent.   L4-5: 6  mm facet mediated anterolisthesis. Disc desiccation with associated broad posterior pseudo disc bulge/uncovering. Moderate to advanced bilateral facet arthrosis. Resultant moderate to severe canal with bilateral subarticular stenosis, with mild bilateral L4 foraminal narrowing.   L5-S1: Disc desiccation with mild disc bulge. Superimposed right foraminal disc protrusion with annular fissure contacts the exiting right L5 nerve root (series 8, image 5). Moderate bilateral facet hypertrophy. Resultant mild bilateral subarticular stenosis. Central canal remains patent. Moderate right L5 foraminal narrowing. Left or pharyngeal veins patent.   IMPRESSION: 1. 6 mm facet mediated anterolisthesis of L4 on L5 with resultant moderate to severe canal and bilateral subarticular stenosis, with mild bilateral L4 foraminal narrowing. 2. Right foraminal disc protrusion at L5-S1, contacting and potentially irritating the exiting right L5 nerve root. 3. Additional mild for age spondylosis elsewhere within the lumbar spine as above. No other significant stenosis or neural impingement. 4. Compression deformity of the superior endplate of T11 with superimposed endplate Schmorl's node deformity, partially visualized. There is mild marrow edema at the anterior aspect of the partially visualized superior endplate, which could reflect a superimposed acute to subacute fracture component. Correlation with physical exam for possible pain at this location recommended.     Electronically Signed   By: Morene Hoard M.D.   On: 04/01/2022 19:49     Objective:  VS:  HT:    WT:   BMI:     BP:   HR: bpm  TEMP: ( )  RESP:  Physical Exam Vitals and nursing  note reviewed.  Constitutional:      General: She is not in acute distress.    Appearance: Normal appearance. She is not ill-appearing.  HENT:     Head: Normocephalic and atraumatic.     Right Ear: External ear normal.     Left Ear: External ear  normal.  Eyes:     Extraocular Movements: Extraocular movements intact.  Cardiovascular:     Rate and Rhythm: Normal rate.     Pulses: Normal pulses.  Pulmonary:     Effort: Pulmonary effort is normal. No respiratory distress.  Abdominal:     General: There is no distension.     Palpations: Abdomen is soft.  Musculoskeletal:        General: Tenderness present.     Cervical back: Neck supple.     Right lower leg: No edema.     Left lower leg: No edema.     Comments: Patient has good distal strength with no pain over the greater trochanters.  No clonus or focal weakness.  Skin:    Findings: No erythema, lesion or rash.  Neurological:     General: No focal deficit present.     Mental Status: She is alert and oriented to person, place, and time.     Sensory: No sensory deficit.     Motor: No weakness or abnormal muscle tone.     Coordination: Coordination normal.  Psychiatric:        Mood and Affect: Mood normal.        Behavior: Behavior normal.      Imaging: No results found. "

## 2024-04-16 ENCOUNTER — Ambulatory Visit: Admitting: Orthopedic Surgery

## 2024-04-16 ENCOUNTER — Ambulatory Visit

## 2024-04-22 ENCOUNTER — Other Ambulatory Visit: Payer: Self-pay | Admitting: Family Medicine

## 2024-04-22 ENCOUNTER — Telehealth: Payer: Self-pay

## 2024-04-22 ENCOUNTER — Other Ambulatory Visit (HOSPITAL_BASED_OUTPATIENT_CLINIC_OR_DEPARTMENT_OTHER): Payer: Self-pay | Admitting: Family Medicine

## 2024-04-22 DIAGNOSIS — N184 Chronic kidney disease, stage 4 (severe): Secondary | ICD-10-CM

## 2024-04-22 NOTE — Telephone Encounter (Signed)
 Patient left VM on the phone asking if she can cancel the 5th and come in on the 12th. She wants you to call her back

## 2024-04-23 ENCOUNTER — Other Ambulatory Visit (HOSPITAL_BASED_OUTPATIENT_CLINIC_OR_DEPARTMENT_OTHER): Payer: Self-pay | Admitting: Family Medicine

## 2024-04-23 ENCOUNTER — Other Ambulatory Visit: Payer: Self-pay | Admitting: Family Medicine

## 2024-04-23 NOTE — Telephone Encounter (Signed)
 Tried to call to r/s appt-will try again

## 2024-04-23 NOTE — Telephone Encounter (Signed)
 Coming in 05/02/23 @ 145

## 2024-04-24 ENCOUNTER — Ambulatory Visit: Admitting: Orthopedic Surgery

## 2024-05-01 ENCOUNTER — Ambulatory Visit: Admitting: Orthopedic Surgery

## 2024-05-01 ENCOUNTER — Ambulatory Visit

## 2024-06-04 ENCOUNTER — Ambulatory Visit: Admitting: Psychiatry

## 2024-06-11 ENCOUNTER — Ambulatory Visit: Admitting: Dermatology

## 2024-06-12 ENCOUNTER — Ambulatory Visit: Admitting: Dermatology

## 2024-07-30 ENCOUNTER — Ambulatory Visit (HOSPITAL_BASED_OUTPATIENT_CLINIC_OR_DEPARTMENT_OTHER): Admitting: Family Medicine
